# Patient Record
Sex: Male | Born: 1955 | Race: Black or African American | Hispanic: No | Marital: Married | Smoking: Former smoker
Health system: Southern US, Community
[De-identification: ages and names within clinical notes are randomized; demographics above are authoritative.]

## PROBLEM LIST (undated history)

## (undated) DIAGNOSIS — Z531 Procedure and treatment not carried out because of patient's decision for reasons of belief and group pressure: Secondary | ICD-10-CM

## (undated) DIAGNOSIS — M1990 Unspecified osteoarthritis, unspecified site: Secondary | ICD-10-CM

## (undated) DIAGNOSIS — E785 Hyperlipidemia, unspecified: Secondary | ICD-10-CM

## (undated) DIAGNOSIS — N289 Disorder of kidney and ureter, unspecified: Secondary | ICD-10-CM

## (undated) DIAGNOSIS — R0600 Dyspnea, unspecified: Secondary | ICD-10-CM

## (undated) DIAGNOSIS — I214 Non-ST elevation (NSTEMI) myocardial infarction: Secondary | ICD-10-CM

## (undated) DIAGNOSIS — I10 Essential (primary) hypertension: Secondary | ICD-10-CM

## (undated) DIAGNOSIS — I739 Peripheral vascular disease, unspecified: Secondary | ICD-10-CM

## (undated) DIAGNOSIS — E119 Type 2 diabetes mellitus without complications: Secondary | ICD-10-CM

## (undated) DIAGNOSIS — IMO0001 Reserved for inherently not codable concepts without codable children: Secondary | ICD-10-CM

## (undated) HISTORY — DX: Peripheral vascular disease, unspecified: I73.9

## (undated) HISTORY — PX: TONSILLECTOMY: SUR1361

## (undated) HISTORY — PX: ORIF CONGENITAL HIP DISLOCATION: SHX2117

## (undated) HISTORY — DX: Disorder of kidney and ureter, unspecified: N28.9

## (undated) HISTORY — DX: Hyperlipidemia, unspecified: E78.5

## (undated) HISTORY — DX: Essential (primary) hypertension: I10

---

## 2001-05-27 HISTORY — PX: PERIPHERAL VASCULAR CATHETERIZATION: SHX172C

## 2001-12-23 ENCOUNTER — Ambulatory Visit (HOSPITAL_COMMUNITY): Admission: RE | Admit: 2001-12-23 | Discharge: 2001-12-23 | Payer: Self-pay | Admitting: *Deleted

## 2002-04-14 ENCOUNTER — Ambulatory Visit (HOSPITAL_COMMUNITY): Admission: RE | Admit: 2002-04-14 | Discharge: 2002-04-15 | Payer: Self-pay | Admitting: *Deleted

## 2004-08-29 ENCOUNTER — Ambulatory Visit: Payer: Self-pay | Admitting: Family Medicine

## 2008-04-26 ENCOUNTER — Ambulatory Visit: Payer: Self-pay | Admitting: Gastroenterology

## 2013-02-12 ENCOUNTER — Encounter: Payer: Self-pay | Admitting: *Deleted

## 2013-02-18 ENCOUNTER — Encounter: Payer: Self-pay | Admitting: Cardiovascular Disease

## 2013-02-18 ENCOUNTER — Ambulatory Visit (INDEPENDENT_AMBULATORY_CARE_PROVIDER_SITE_OTHER): Payer: Self-pay | Admitting: Cardiovascular Disease

## 2013-02-18 VITALS — BP 155/82 | HR 83 | Ht 75.0 in | Wt 233.0 lb

## 2013-02-18 DIAGNOSIS — R0989 Other specified symptoms and signs involving the circulatory and respiratory systems: Secondary | ICD-10-CM

## 2013-02-18 DIAGNOSIS — I739 Peripheral vascular disease, unspecified: Secondary | ICD-10-CM | POA: Insufficient documentation

## 2013-02-18 DIAGNOSIS — E119 Type 2 diabetes mellitus without complications: Secondary | ICD-10-CM | POA: Insufficient documentation

## 2013-02-18 DIAGNOSIS — R0609 Other forms of dyspnea: Secondary | ICD-10-CM

## 2013-02-18 DIAGNOSIS — I10 Essential (primary) hypertension: Secondary | ICD-10-CM | POA: Insufficient documentation

## 2013-02-18 DIAGNOSIS — R0600 Dyspnea, unspecified: Secondary | ICD-10-CM

## 2013-02-18 NOTE — Patient Instructions (Addendum)
Your physician wants you to follow-up in:  12 months. You will receive a reminder letter in the mail two months in advance. If you don't receive a letter, please call our office to schedule the follow-up appointment.  Your physician has requested that you have a lower or upper extremity arterial duplex. This test is an ultrasound of the arteries in the legs or arms. It looks at arterial blood flow in the legs and arms. Allow one hour for Lower and Upper Arterial scans. There are no restrictions or special instructions

## 2013-02-18 NOTE — Progress Notes (Signed)
History of Present Illness: 57 yo male with history of PAD, HLD, HTN, DM who is here today to establish cardiology/PV care. He reports having stents placed in both legs in 2003. This was done at [facility] but no records are available. He has since been seen by [facility]. He has not seen that practice in 2-3 years. He is a former smoker but stopped in 2004. (70 pack years). He tells me that he feels well overall. No chest pain. He does have dyspnea with exertion which is stable. He exercises 3 days per week. He can walk 1/4 mile before he has onset of pain in his legs. No rest pain or ulcerations.   Primary Care Physician: Dennison Mascot  Past Medical History  Diagnosis Date  . Diabetes mellitus without complication   . HTN (hypertension)   . Hyperlipidemia   . PAD (peripheral artery disease)     Stenting of the ? iliac arteries in     Past Surgical History  Procedure Laterality Date  . Hip surgery      Current Outpatient Prescriptions  Medication Sig Dispense Refill  . aspirin 81 MG tablet Take 81 mg by mouth daily.      . metFORMIN (GLUCOPHAGE) 1000 MG tablet Take 1,000 mg by mouth 2 (two) times daily with a meal.      . simvastatin (ZOCOR) 40 MG tablet Take 40 mg by mouth every evening.       No current facility-administered medications for this visit.    No Known Allergies  History   Social History  . Marital Status: Divorced    Spouse Name: N/A    Number of Children: 1  . Years of Education: N/A   Occupational History  . Furniture salesman    Social History Main Topics  . Smoking status: Former Smoker -- 2.00 packs/day for 35 years    Types: Cigarettes    Quit date: 02/19/2003  . Smokeless tobacco: Not on file  . Alcohol Use: No  . Drug Use: No  . Sexual Activity: Not on file   Other Topics Concern  . Not on file   Social History Narrative  . No narrative on file    Family History  Problem Relation Age of Onset  . Cancer - Prostate  Father   . Lymphoma Mother   . CAD Neg Hx     Review of Systems:  As stated in the HPI and otherwise negative.   BP 155/82  Pulse 83  Ht 6\' 3"  (1.905 m)  Wt 233 lb (105.688 kg)  BMI 29.12 kg/m2  Physical Examination: General: Well developed, well nourished, NAD HEENT: OP clear, mucus membranes moist SKIN: warm, dry. No rashes. Neuro: No focal deficits Musculoskeletal: Muscle strength 5/5 all ext Psychiatric: Mood and affect normal Neck: No JVD, no carotid bruits, no thyromegaly, no lymphadenopathy. Lungs:Clear bilaterally, no wheezes, rhonci, crackles Cardiovascular: Regular rate and rhythm. No murmurs, gallops or rubs. Abdomen:Soft. Bowel sounds present. Non-tender.  Extremities: No lower extremity edema. Pulses are non-palpabe in the bilateral DP/PT.  ZOX:WRUEA, rate 75 bpm. 1st degree AV block. Non-specific T wave abnormalities.   Assessment and Plan:   1. PAD: His vascular disease appears to be stable. Unclear where his stents are. Will get records from [facility] office in [place]. Will arrange full LE dopplers with [facility]. Continue ASA. He was on Pletal in the past but stopped this, no apparent side effects. Continue statin.   2. Dyspnea: Stable. No known  cardiac disease although we discussed his risk factors for CAD. He does not wish to pursue any ischemic testing or an echocardiogram as he is self pay. He will call if he has any chest pain or change in dyspnea.

## 2013-02-26 ENCOUNTER — Encounter (HOSPITAL_COMMUNITY): Payer: Self-pay

## 2013-03-10 ENCOUNTER — Ambulatory Visit (HOSPITAL_COMMUNITY): Payer: Self-pay | Attending: Cardiology

## 2013-03-10 DIAGNOSIS — E785 Hyperlipidemia, unspecified: Secondary | ICD-10-CM | POA: Insufficient documentation

## 2013-03-10 DIAGNOSIS — I739 Peripheral vascular disease, unspecified: Secondary | ICD-10-CM

## 2013-03-10 DIAGNOSIS — I10 Essential (primary) hypertension: Secondary | ICD-10-CM | POA: Insufficient documentation

## 2013-03-10 DIAGNOSIS — E119 Type 2 diabetes mellitus without complications: Secondary | ICD-10-CM | POA: Insufficient documentation

## 2013-03-10 DIAGNOSIS — R209 Unspecified disturbances of skin sensation: Secondary | ICD-10-CM | POA: Insufficient documentation

## 2013-03-10 DIAGNOSIS — I70219 Atherosclerosis of native arteries of extremities with intermittent claudication, unspecified extremity: Secondary | ICD-10-CM

## 2013-03-10 DIAGNOSIS — Z87891 Personal history of nicotine dependence: Secondary | ICD-10-CM | POA: Insufficient documentation

## 2013-03-10 DIAGNOSIS — I2510 Atherosclerotic heart disease of native coronary artery without angina pectoris: Secondary | ICD-10-CM | POA: Insufficient documentation

## 2013-08-27 ENCOUNTER — Telehealth: Payer: Self-pay | Admitting: Cardiovascular Disease

## 2013-08-27 NOTE — Telephone Encounter (Signed)
Mr Urieta called regarding his account being sent to collections.  He stated that he had paid the amount in question.  I referred him to [facility] and gave their phone number [phone]).  He thanked me for the information.

## 2013-11-24 ENCOUNTER — Other Ambulatory Visit (HOSPITAL_COMMUNITY): Payer: Self-pay | Admitting: *Deleted

## 2013-11-24 DIAGNOSIS — I739 Peripheral vascular disease, unspecified: Secondary | ICD-10-CM

## 2013-12-01 ENCOUNTER — Encounter (HOSPITAL_COMMUNITY): Payer: Self-pay

## 2013-12-29 ENCOUNTER — Encounter (HOSPITAL_COMMUNITY): Payer: Self-pay

## 2014-01-29 ENCOUNTER — Emergency Department (HOSPITAL_COMMUNITY)
Admission: EM | Admit: 2014-01-29 | Discharge: 2014-01-29 | Disposition: A | Discharge status: Home / Self Care | Payer: Self-pay | Attending: Emergency Medicine | Admitting: Emergency Medicine

## 2014-01-29 ENCOUNTER — Encounter (HOSPITAL_COMMUNITY): Payer: Self-pay | Admitting: Emergency Medicine

## 2014-01-29 ENCOUNTER — Emergency Department (HOSPITAL_COMMUNITY): Payer: Self-pay

## 2014-01-29 DIAGNOSIS — M1711 Unilateral primary osteoarthritis, right knee: Secondary | ICD-10-CM

## 2014-01-29 DIAGNOSIS — E119 Type 2 diabetes mellitus without complications: Secondary | ICD-10-CM | POA: Insufficient documentation

## 2014-01-29 DIAGNOSIS — I10 Essential (primary) hypertension: Secondary | ICD-10-CM | POA: Insufficient documentation

## 2014-01-29 DIAGNOSIS — Z79899 Other long term (current) drug therapy: Secondary | ICD-10-CM | POA: Insufficient documentation

## 2014-01-29 DIAGNOSIS — M1710 Unilateral primary osteoarthritis, unspecified knee: Secondary | ICD-10-CM | POA: Insufficient documentation

## 2014-01-29 DIAGNOSIS — M25469 Effusion, unspecified knee: Secondary | ICD-10-CM | POA: Insufficient documentation

## 2014-01-29 DIAGNOSIS — Z87891 Personal history of nicotine dependence: Secondary | ICD-10-CM | POA: Insufficient documentation

## 2014-01-29 DIAGNOSIS — IMO0002 Reserved for concepts with insufficient information to code with codable children: Secondary | ICD-10-CM

## 2014-01-29 DIAGNOSIS — M25569 Pain in unspecified knee: Secondary | ICD-10-CM | POA: Insufficient documentation

## 2014-01-29 DIAGNOSIS — Z7982 Long term (current) use of aspirin: Secondary | ICD-10-CM | POA: Insufficient documentation

## 2014-01-29 DIAGNOSIS — M25561 Pain in right knee: Secondary | ICD-10-CM

## 2014-01-29 DIAGNOSIS — M25461 Effusion, right knee: Secondary | ICD-10-CM

## 2014-01-29 DIAGNOSIS — E785 Hyperlipidemia, unspecified: Secondary | ICD-10-CM | POA: Insufficient documentation

## 2014-01-29 MED ORDER — IBUPROFEN 400 MG PO TABS
800.0000 mg | ORAL_TABLET | Freq: Once | ORAL | Status: AC
Start: 2014-01-29 — End: 2014-01-29
  Administered 2014-01-29: 800 mg via ORAL
  Filled 2014-01-29: qty 2

## 2014-01-29 MED ORDER — HYDROCODONE-ACETAMINOPHEN 5-325 MG PO TABS: 1.0000 | ORAL_TABLET | ORAL | Status: DC | PRN

## 2014-01-29 MED ORDER — MELOXICAM 15 MG PO TABS: 15.0000 mg | ORAL_TABLET | Freq: Every day | ORAL | Status: DC

## 2014-01-29 NOTE — ED Provider Notes (Signed)
Medical screening examination/treatment/procedure(s) were performed by non-physician practitioner and as supervising physician I was immediately available for consultation/collaboration.   EKG Interpretation None        Fitzgerald, DO 01/29/14 2317

## 2014-01-29 NOTE — ED Provider Notes (Signed)
CSN: [id number]     Arrival date & time 01/29/14  1944 History   First MD Initiated Contact with Patient 01/29/14 2018     This chart was scribed for non-physician practitioner, Abigail Butts PA-C working with Delice Bison Ward, DO by Forrestine Him, ED Scribe. This patient was seen in room TR08C/TR08C and the patient's care was started at 10:12 PM.   Chief Complaint  Patient presents with  . Knee Pain   HPI  HPI Comments: Kirk [name] is a 58 y.o. male with a PMHx of non-insulin dependant DM, HTN, PAD, and hyperlipidemia who presents to the Emergency Department complaining of constant, moderate R knee pain with associated swelling x 1.5 weeks.  Pain is exacerbated with certain movements and at bedtime. No alleviating factors at this time. He has tried OTC Ibuprofen without any improvement for symptoms. Last dose earlier this morning. No fever or chills. No weakness, loss of sensation, or numbness. No ankle or foot pain. State blood sugars are under control at this time. No known allergies to medications. No other concerns this visit.  Pt reports this pain has not prevented him from continuing his normal activities such as mowing the grass.    Past Medical History  Diagnosis Date  . Diabetes mellitus without complication   . HTN (hypertension)   . Hyperlipidemia   . PAD (peripheral artery disease)     Stenting of the ? iliac arteries in    Past Surgical History  Procedure Laterality Date  . Hip surgery     Family History  Problem Relation Age of Onset  . Cancer - Prostate Father   . Lymphoma Mother   . CAD Neg Hx    History  Substance Use Topics  . Smoking status: Former Smoker -- 2.00 packs/day for 35 years    Types: Cigarettes    Quit date: 02/19/2003  . Smokeless tobacco: Not on file  . Alcohol Use: No    Review of Systems  Constitutional: Negative for fever and chills.  Gastrointestinal: Negative for nausea and vomiting.  Musculoskeletal: Positive for arthralgias and  joint swelling. Negative for back pain, neck pain and neck stiffness.  Skin: Negative for wound.  Neurological: Negative for weakness and numbness.  Hematological: Does not bruise/bleed easily.  Psychiatric/Behavioral: The patient is not nervous/anxious.   All other systems reviewed and are negative.     Allergies  Review of patient's allergies indicates no known allergies.  Home Medications   Prior to Admission medications   Medication Sig Start Date End Date Taking? Authorizing Provider  aspirin 81 MG tablet Take 81 mg by mouth daily.   Yes Historical Provider, MD  metFORMIN (GLUCOPHAGE) 1000 MG tablet Take 1,000 mg by mouth 2 (two) times daily with a meal.   Yes Historical Provider, MD  simvastatin (ZOCOR) 40 MG tablet Take 40 mg by mouth every evening.   Yes Historical Provider, MD  HYDROcodone-acetaminophen (NORCO/VICODIN) 5-325 MG per tablet Take 1-2 tablets by mouth every 4 (four) hours as needed for moderate pain or severe pain. 01/29/14   Raye Wiens, PA-C  meloxicam (MOBIC) 15 MG tablet Take 1 tablet (15 mg total) by mouth daily. 01/29/14   Armanii Pressnell, PA-C   Triage Vitals: BP 144/69  Pulse 84  Temp(Src) 98 F (36.7 C) (Oral)  Resp 16  Ht 6\' 3"  (1.905 m)  Wt 235 lb (106.595 kg)  BMI 29.37 kg/m2  SpO2 96%   Physical Exam  Nursing note and vitals reviewed. Constitutional:  He appears well-developed and well-nourished. No distress.  HENT:  Head: Normocephalic and atraumatic.  Eyes: Conjunctivae are normal.  Neck: Normal range of motion.  Cardiovascular: Normal rate, regular rhythm, normal heart sounds and intact distal pulses.   No murmur heard. Capillary refill less than 3 seconds  Pulmonary/Chest: Effort normal and breath sounds normal.  Musculoskeletal: He exhibits tenderness. He exhibits no edema.  FROM with pain Palpable swelling to popliteal space; minor joint effusion Ambulatory without difficulty  No redness or increased heat  Neurological:  He is alert. Coordination normal.  Sensation intact  Strength 5/5 including with resisted flexion of R knee  Skin: Skin is warm and dry. He is not diaphoretic.  No tenting of the skin  Psychiatric: He has a normal mood and affect.    ED Course  Procedures (including critical care time)  DIAGNOSTIC STUDIES: Oxygen Saturation is 99% on RA, Normal by my interpretation.    COORDINATION OF CARE: 10:12 PM- Will order DG knee complete 4 views. Discussed treatment plan with pt at bedside and pt agreed to plan.     Labs Review Labs Reviewed - No data to display  Imaging Review Dg Knee Complete 4 Views Right  01/29/2014   CLINICAL DATA:  Right knee pain and inflammation for 3 days. Can't bear weight.  EXAM: RIGHT KNEE - COMPLETE 4+ VIEW  COMPARISON:  None.  FINDINGS: Moderate size right knee effusion. Mild degenerative changes in the right knee with mild medial compartment narrowing and small osteophytes on the patella. No evidence of acute fracture or dislocation. No bone erosion or cortical disruption. Vascular calcifications.  IMPRESSION: Mild degenerative changes in the right knee. Moderate-sized effusion. No acute bony abnormalities.   Electronically Signed   By: Lucienne Capers M.D.   On: 01/29/2014 21:36     EKG Interpretation None      MDM   Final diagnoses:  Knee effusion, right  Arthralgia of right knee  Arthritis of right knee   Kirk [name] presents with nontraumatic right knee pain.  Pt with mild swelling to the joint spaces, knee swelling, tightness in the knee, and no restricted range of motion. Pt able to perform full flexion of the knee with pain and c/o "tightness".  Pt is without systemic symptoms, erythema or redness of the joint consistent with gout or septic joint.  Patient X-Ray negative for obvious fracture or dislocation. Pain managed in ED. Pt advised to follow up with orthopedics if symptoms persist for further evaluation and treatment. Patient given brace  while in ED, conservative therapy recommended and discussed. Patient will be dc home & is agreeable with above plan.  BP 144/69  Pulse 84  Temp(Src) 98 F (36.7 C) (Oral)  Resp 16  Ht 6\' 3"  (1.905 m)  Wt 235 lb (106.595 kg)  BMI 29.37 kg/m2  SpO2 96%  I personally performed the services described in this documentation, which was scribed in my presence. The recorded information has been reviewed and is accurate.    Jarrett Soho Hayden Kihara, PA-C 01/29/14 2214

## 2014-01-29 NOTE — Discharge Instructions (Signed)
1. Medications: vicodin for severe pain, mobic everyday with food for imflammation, usual home medications 2. Treatment: rest, drink plenty of fluids, ice, elevate 3. Follow Up: Please followup with your primary doctor for discussion of your diagnoses and further evaluation after today's visit; if you do not have a primary care doctor use the resource guide provided to find one; Also follow-up with orthopedics for further evaluation of your knee  Knee Effusion The medical term for having fluid in your knee is effusion. This is often due to an internal derangement of the knee. This means something is wrong inside the knee. Some of the causes of fluid in the knee may be torn cartilage, a torn ligament, or bleeding into the joint from an injury. Your knee is likely more difficult to bend and move. This is often because there is increased pain and pressure in the joint. The time it takes for recovery from a knee effusion depends on different factors, including:   Type of injury.  Your age.  Physical and medical conditions.  Rehabilitation Strategies. How long you will be away from your normal activities will depend on what kind of knee problem you have and how much damage is present. Your knee has two types of cartilage. Articular cartilage covers the bone ends and lets your knee bend and move smoothly. Two menisci, thick pads of cartilage that form a rim inside the joint, help absorb shock and stabilize your knee. Ligaments bind the bones together and support your knee joint. Muscles move the joint, help support your knee, and take stress off the joint itself. CAUSES  Often an effusion in the knee is caused by an injury to one of the menisci. This is often a tear in the cartilage. Recovery after a meniscus injury depends on how much meniscus is damaged and whether you have damaged other knee tissue. Small tears may heal on their own with conservative treatment. Conservative means rest, limited weight  bearing activity and muscle strengthening exercises. Your recovery may take up to 6 weeks.  TREATMENT  Larger tears may require surgery. Meniscus injuries may be treated during arthroscopy. Arthroscopy is a procedure in which your surgeon uses a small telescope like instrument to look in your knee. Your caregiver can make a more accurate diagnosis (learning what is wrong) by performing an arthroscopic procedure. If your injury is on the inner margin of the meniscus, your surgeon may trim the meniscus back to a smooth rim. In other cases your surgeon will try to repair a damaged meniscus with stitches (sutures). This may make rehabilitation take longer, but may provide better long term result by helping your knee keep its shock absorption capabilities. Ligaments which are completely torn usually require surgery for repair. HOME CARE INSTRUCTIONS  Use crutches as instructed.  If a brace is applied, use as directed.  Once you are home, an ice pack applied to your swollen knee may help with discomfort and help decrease swelling.  Keep your knee raised (elevated) when you are not up and around or on crutches.  Only take over-the-counter or prescription medicines for pain, discomfort, or fever as directed by your caregiver.  Your caregivers will help with instructions for rehabilitation of your knee. This often includes strengthening exercises.  You may resume a normal diet and activities as directed. SEEK MEDICAL CARE IF:   There is increased swelling in your knee.  You notice redness, swelling, or increasing pain in your knee.  An unexplained oral temperature above 102  F (38.9 C) develops. SEEK IMMEDIATE MEDICAL CARE IF:   You develop a rash.  You have difficulty breathing.  You have any allergic reactions from medications you may have been given.  There is severe pain with any motion of the knee. MAKE SURE YOU:   Understand these instructions.  Will watch your  condition.  Will get help right away if you are not doing well or get worse. Document Released: 08/03/2003 Document Revised: 08/05/2011 Document Reviewed: 10/07/2007 [facility] Patient Information 2015 [place], [place]. This information is not intended to replace advice given to you by your health care provider. Make sure you discuss any questions you have with your health care provider.

## 2014-01-29 NOTE — ED Notes (Signed)
Pt. reports progressing right knee pain with mild swelling for 1 1/2 weeks , denies injury or fall , ambulatory , no fever or chills.

## 2014-11-29 ENCOUNTER — Encounter: Payer: Self-pay | Admitting: Family Medicine

## 2014-12-02 ENCOUNTER — Other Ambulatory Visit: Payer: Self-pay | Admitting: Family Medicine

## 2015-01-03 ENCOUNTER — Ambulatory Visit (INDEPENDENT_AMBULATORY_CARE_PROVIDER_SITE_OTHER): Payer: Self-pay | Admitting: Family Medicine

## 2015-01-03 ENCOUNTER — Encounter: Payer: Self-pay | Admitting: Family Medicine

## 2015-01-03 VITALS — BP 120/61 | HR 85 | Temp 98.2°F | Resp 18 | Ht 73.0 in | Wt 234.6 lb

## 2015-01-03 DIAGNOSIS — Z0000 Encounter for general adult medical examination without abnormal findings: Secondary | ICD-10-CM

## 2015-01-03 MED ORDER — LISINOPRIL 10 MG PO TABS: 10.0000 mg | ORAL_TABLET | Freq: Every day | ORAL | Status: DC

## 2015-01-03 MED ORDER — GLIMEPIRIDE 4 MG PO TABS: 4.0000 mg | ORAL_TABLET | Freq: Every day | ORAL | Status: DC

## 2015-01-03 MED ORDER — SIMVASTATIN 40 MG PO TABS: 40.0000 mg | ORAL_TABLET | Freq: Every evening | ORAL | Status: DC

## 2015-01-03 MED ORDER — MELOXICAM 15 MG PO TABS
15.0000 mg | ORAL_TABLET | Freq: Every day | ORAL | Status: DC
Start: 2015-01-03 — End: 2015-03-03

## 2015-01-03 MED ORDER — METFORMIN HCL 1000 MG PO TABS: 1000.0000 mg | ORAL_TABLET | Freq: Two times a day (BID) | ORAL | Status: DC

## 2015-01-03 NOTE — Patient Instructions (Signed)

## 2015-01-03 NOTE — Progress Notes (Signed)
Name: Kirk [name]   MRN: [id number]    DOB: 03-13-1956   Date:01/03/2015       Progress Note  Subjective  Chief Complaint  Chief Complaint  Patient presents with  . Annual Exam    HPI  Patient presents for annual H&P. His baseline medical issues stable  Past Medical History  Diagnosis Date  . Diabetes mellitus without complication   . HTN (hypertension)   . Hyperlipidemia   . PAD (peripheral artery disease)     Stenting of the ? iliac arteries in     History  Substance Use Topics  . Smoking status: Former Smoker -- 2.00 packs/day for 35 years    Types: Cigarettes    Quit date: 02/19/2003  . Smokeless tobacco: Not on file  . Alcohol Use: No     Current outpatient prescriptions:  .  aspirin 81 MG tablet, Take 81 mg by mouth daily., Disp: , Rfl:  .  glimepiride (AMARYL) 4 MG tablet, Take 1 tablet (4 mg total) by mouth daily with breakfast., Disp: 90 tablet, Rfl: 1 .  HYDROcodone-acetaminophen (NORCO/VICODIN) 5-325 MG per tablet, Take 1-2 tablets by mouth every 4 (four) hours as needed for moderate pain or severe pain., Disp: 15 tablet, Rfl: 0 .  lisinopril (PRINIVIL,ZESTRIL) 10 MG tablet, Take 1 tablet (10 mg total) by mouth daily., Disp: 90 tablet, Rfl: 1 .  meloxicam (MOBIC) 15 MG tablet, Take 1 tablet (15 mg total) by mouth daily., Disp: 90 tablet, Rfl: 1 .  metFORMIN (GLUCOPHAGE) 1000 MG tablet, Take 1 tablet (1,000 mg total) by mouth 2 (two) times daily., Disp: 180 tablet, Rfl: 1 .  simvastatin (ZOCOR) 40 MG tablet, Take 1 tablet (40 mg total) by mouth every evening., Disp: 90 tablet, Rfl: 1  No Known Allergies  Review of Systems  Constitutional: Negative for fever, chills and weight loss.  HENT: Negative for congestion, hearing loss, sore throat and tinnitus.   Eyes: Negative for blurred vision, double vision and redness.  Respiratory: Negative for cough, hemoptysis and shortness of breath.   Cardiovascular: Positive for claudication. Negative for chest pain,  palpitations, orthopnea and leg swelling.  Gastrointestinal: Negative for heartburn, nausea, vomiting, diarrhea, constipation and blood in stool.  Genitourinary: Negative for dysuria, urgency, frequency and hematuria.  Musculoskeletal: Positive for neck pain. Negative for myalgias, back pain, joint pain and falls.  Skin: Negative for itching.  Neurological: Negative for dizziness, tingling, tremors, focal weakness, seizures, loss of consciousness, weakness and headaches.  Endo/Heme/Allergies: Does not bruise/bleed easily.  Psychiatric/Behavioral: Negative for depression and substance abuse. The patient is not nervous/anxious and does not have insomnia.      Objective  Filed Vitals:   01/03/15 1120  BP: 120/61  Pulse: 85  Temp: 98.2 F (36.8 C)  TempSrc: Oral  Resp: 18  Height: '6\' 1"'$  (1.854 m)  Weight: 234 lb 9.6 oz (106.414 kg)  SpO2: 96%     Physical Exam  Constitutional: He is oriented to person, place, and time and well-developed, well-nourished, and in no distress.  HENT:  Head: Normocephalic.  Eyes: EOM are normal. Pupils are equal, round, and reactive to light.  Neck: Normal range of motion. Neck supple. No thyromegaly present.  Cardiovascular: Normal rate, regular rhythm and normal heart sounds.   No murmur heard. Pulmonary/Chest: Effort normal and breath sounds normal. No respiratory distress. He has no wheezes.  Abdominal: Soft. Bowel sounds are normal.  Genitourinary: Rectum normal, prostate normal and penis normal. Guaiac negative stool. No  discharge found.  Musculoskeletal: Normal range of motion. He exhibits no edema.  Lymphadenopathy:    He has no cervical adenopathy.  Neurological: He is alert and oriented to person, place, and time. No cranial nerve deficit. Gait normal. Coordination normal.  Skin: Skin is warm and dry. No rash noted.  Psychiatric: Affect and judgment normal.      Assessment & Plan

## 2015-01-04 NOTE — Addendum Note (Signed)
Addended by: Ashok Norris on: 01/04/2015 10:42 AM   Modules accepted: Miquel Dunn

## 2015-01-07 ENCOUNTER — Other Ambulatory Visit: Payer: Self-pay | Admitting: Family Medicine

## 2015-03-03 ENCOUNTER — Other Ambulatory Visit: Payer: Self-pay | Admitting: Family Medicine

## 2015-03-06 ENCOUNTER — Ambulatory Visit: Payer: Self-pay | Admitting: Family Medicine

## 2015-03-16 ENCOUNTER — Other Ambulatory Visit: Payer: Self-pay | Admitting: Family Medicine

## 2015-05-05 ENCOUNTER — Other Ambulatory Visit: Payer: Self-pay | Admitting: Family Medicine

## 2015-06-08 ENCOUNTER — Encounter: Payer: Self-pay | Admitting: Family Medicine

## 2015-06-08 ENCOUNTER — Ambulatory Visit (INDEPENDENT_AMBULATORY_CARE_PROVIDER_SITE_OTHER): Payer: Self-pay | Admitting: Family Medicine

## 2015-06-08 VITALS — BP 146/72 | HR 90 | Temp 98.1°F | Resp 18 | Ht 73.0 in | Wt 245.9 lb

## 2015-06-08 DIAGNOSIS — I10 Essential (primary) hypertension: Secondary | ICD-10-CM

## 2015-06-08 DIAGNOSIS — E1121 Type 2 diabetes mellitus with diabetic nephropathy: Secondary | ICD-10-CM

## 2015-06-08 DIAGNOSIS — E785 Hyperlipidemia, unspecified: Secondary | ICD-10-CM

## 2015-06-08 DIAGNOSIS — E1169 Type 2 diabetes mellitus with other specified complication: Secondary | ICD-10-CM

## 2015-06-08 DIAGNOSIS — M5030 Other cervical disc degeneration, unspecified cervical region: Secondary | ICD-10-CM | POA: Insufficient documentation

## 2015-06-08 DIAGNOSIS — E1151 Type 2 diabetes mellitus with diabetic peripheral angiopathy without gangrene: Secondary | ICD-10-CM

## 2015-06-08 DIAGNOSIS — I739 Peripheral vascular disease, unspecified: Secondary | ICD-10-CM

## 2015-06-08 LAB — POCT UA - MICROALBUMIN: Microalbumin Ur, POC: 100 mg/L

## 2015-06-08 LAB — POCT GLYCOSYLATED HEMOGLOBIN (HGB A1C): Hemoglobin A1C: 7.3

## 2015-06-08 LAB — GLUCOSE, POCT (MANUAL RESULT ENTRY): POC Glucose: 140 mg/dl — AB (ref 70–99)

## 2015-06-08 MED ORDER — GABAPENTIN 300 MG PO CAPS: 300.0000 mg | ORAL_CAPSULE | Freq: Three times a day (TID) | ORAL | Status: DC

## 2015-06-08 NOTE — Progress Notes (Signed)
Name: Kirk [name]   MRN: [id number]    DOB: Nov 10, 1955   Date:06/08/2015       Progress Note  Subjective  Chief Complaint  Chief Complaint  Patient presents with  . Hypertension    4 month follow up  . Diabetes  . Hyperlipidemia  . Shoulder Pain    right shoulder pain radiates down arm  . Gas    belching all the time    HPI  Diabetes  Patient presents for follow-up of diabetes which is present for over 5 years. Is currently on a regimen of metformin thousand milligrams twice a day glimepiride 4 mg daily . Patient states somewhat compliant with their diet and exercise. There's been no hypoglycemic episodes and there no polyuria polydipsia polyphagia. His average fasting glucoses been in the low around - with a high around - . There is no end organ disease.  Last diabetic eye exam was last year.   Last visit with dietitian was several years ago. Last microalbumin was obtained 100 on today .   Hypertension   Patient presents for follow-up of hypertension. It has been present for over over 5 years.  Patient states that there is compliance with medical regimen which consists of lisinopril 10 mg daily . There is no end organ disease. Cardiac risk factors include hypertension hyperlipidemia and diabetes.  Exercise regimen consist of some walking .  Diet consist of salt restriction .  Hyperlipidemia  Patient has a history of hyperlipidemia for over 5 years.  Current medical regimen consist of simvastatin 40 mg daily at bedtime .  Compliance is good .  Diet and exercise are currently followed fairly well .  Risk factors for cardiovascular disease include hyperlipidemia hypertension peripheral vascular disease .   There have been no side effects from the medication.   Peripheral vascular disease  Over 5 years history of peripheral vascular disease a consistent claudication with walking more than a few blocks. He has been evaluated by thoracic surgeon  Dyspepsia Recurrent episodes of  dyspepsia with gas go for heartburn burping and flatus. No nausea vomiting diarrhea. (  Right shoulder pain    complaint of right shoulder pain radiating to the arm as well as some right neck pain. It is worse at night. This has not alleviated the meloxicam is currently taking.      Past Medical History  Diagnosis Date  . Diabetes mellitus without complication ([place])   . HTN (hypertension)   . Hyperlipidemia   . PAD (peripheral artery disease) (HCC)     Stenting of the ? iliac arteries in     Social History  Substance Use Topics  . Smoking status: Former Smoker -- 2.00 packs/day for 35 years    Types: Cigarettes    Quit date: 02/19/2003  . Smokeless tobacco: Not on file  . Alcohol Use: No     Current outpatient prescriptions:  .  aspirin 81 MG tablet, Take 81 mg by mouth daily., Disp: , Rfl:  .  glimepiride (AMARYL) 4 MG tablet, take 1 tablet once daily, Disp: 90 tablet, Rfl: 1 .  lisinopril (PRINIVIL,ZESTRIL) 10 MG tablet, Take 1 tablet (10 mg total) by mouth daily., Disp: 90 tablet, Rfl: 1 .  meloxicam (MOBIC) 15 MG tablet, take 1 tablet by mouth at bedtime, Disp: 30 tablet, Rfl: 2 .  metFORMIN (GLUCOPHAGE) 1000 MG tablet, Take 1 tablet (1,000 mg total) by mouth 2 (two) times daily., Disp: 180 tablet, Rfl: 1 .  simvastatin (ZOCOR)  40 MG tablet, take 1 tablet by mouth once daily, Disp: 90 tablet, Rfl: 0 .  HYDROcodone-acetaminophen (NORCO/VICODIN) 5-325 MG per tablet, Take 1-2 tablets by mouth every 4 (four) hours as needed for moderate pain or severe pain. (Patient not taking: Reported on 06/08/2015), Disp: 15 tablet, Rfl: 0  No Known Allergies  Review of Systems  Constitutional: Negative for fever, chills and weight loss.  HENT: Negative for congestion, hearing loss, sore throat and tinnitus.   Eyes: Negative for blurred vision, double vision and redness.  Respiratory: Negative for cough, hemoptysis and shortness of breath.   Cardiovascular: Negative for chest pain,  palpitations, orthopnea, claudication and leg swelling.  Gastrointestinal: Positive for heartburn. Negative for nausea, vomiting, diarrhea, constipation and blood in stool.  Genitourinary: Negative for dysuria, urgency, frequency and hematuria.  Musculoskeletal: Positive for joint pain and neck pain. Negative for myalgias, back pain and falls.  Skin: Negative for itching.  Neurological: Negative for dizziness, tingling, tremors, focal weakness, seizures, loss of consciousness, weakness and headaches.  Endo/Heme/Allergies: Does not bruise/bleed easily.  Psychiatric/Behavioral: Negative for depression and substance abuse. The patient is not nervous/anxious and does not have insomnia.      Objective  Filed Vitals:   06/08/15 0747  BP: 146/72  Pulse: 90  Temp: 98.1 F (36.7 C)  TempSrc: Oral  Resp: 18  Height: '6\' 1"'$  (1.854 m)  Weight: 245 lb 14.4 oz (111.54 kg)  SpO2: 95%     Physical Exam  Constitutional: He is oriented to person, place, and time and well-developed, well-nourished, and in no distress.  HENT:  Head: Normocephalic.  Eyes: EOM are normal. Pupils are equal, round, and reactive to light.  Neck: Normal range of motion. No thyromegaly present.  Cardiovascular: Normal rate, regular rhythm and normal heart sounds.   No murmur heard. Pulmonary/Chest: Effort normal and breath sounds normal. No respiratory distress. He has no wheezes.  Abdominal: Soft. Bowel sounds are normal.  Musculoskeletal: He exhibits tenderness. He exhibits no edema.  Tenderness to palpation on the right sternocleidomastoid trapezius and subscapular area with radiation of pain to the shoulder and to the C5-6 area distribution.  Lymphadenopathy:    He has no cervical adenopathy.  Neurological: He is alert and oriented to person, place, and time. No cranial nerve deficit. Gait normal. Coordination normal.  Skin: Skin is warm and dry. No rash noted.  Psychiatric: Affect and judgment normal.       Assessment & Planme   1. Type 2 diabetes mellitus with other specified complication (HCC) Slightly better control - POCT HgB A1C - POCT UA - Microalbumin - POCT Glucose (CBG) - Comprehensive Metabolic Panel (CMET)  2. DDD (degenerative disc disease), cervical Encourage range of motion exercises to not improving 1 month consider MRI - gabapentin (NEURONTIN) 300 MG capsule; Take 1 capsule (300 mg total) by mouth 3 (three) times daily. 1 a week for 1 week then bid week 2 then tid  Dispense: 90 capsule; Refill: 3 - DG Cervical Spine Complete; Future  3. Hyperlipidemia Labs - Lipid panel  4. Essential hypertension Well-controlled - Comprehensive Metabolic Panel (CMET) - TSH  5. PVD (peripheral vascular disease) (HCC) Stable encourage aerobic exercise as much as possible - Comprehensive Metabolic Panel (CMET) - TSH  6. PAD (peripheral artery disease) ([place]) As above  7. Diabetes mellitus with nephropathy (HCC) Strict control of diabetes and hypertension and continue ARB  8. Diabetes mellitus with peripheral vascular disease ([place]) As above

## 2015-06-09 ENCOUNTER — Telehealth: Payer: Self-pay | Admitting: Emergency Medicine

## 2015-06-09 LAB — COMPREHENSIVE METABOLIC PANEL
A/G RATIO: 1.5 (ref 1.1–2.5)
ALK PHOS: 72 IU/L (ref 39–117)
ALT: 28 IU/L (ref 0–44)
AST: 17 IU/L (ref 0–40)
Albumin: 4.8 g/dL (ref 3.6–4.8)
BILIRUBIN TOTAL: 0.3 mg/dL (ref 0.0–1.2)
BUN/Creatinine Ratio: 16 (ref 10–22)
BUN: 15 mg/dL (ref 8–27)
CHLORIDE: 98 mmol/L (ref 96–106)
CO2: 23 mmol/L (ref 18–29)
Calcium: 10 mg/dL (ref 8.6–10.2)
Creatinine, Ser: 0.93 mg/dL (ref 0.76–1.27)
GFR calc Af Amer: 102 mL/min/{1.73_m2} (ref >59)
GFR calc non Af Amer: 88 mL/min/{1.73_m2} (ref >59)
GLOBULIN, TOTAL: 3.2 g/dL (ref 1.5–4.5)
Glucose: 156 mg/dL — ABNORMAL HIGH (ref 65–99)
Potassium: 4.9 mmol/L (ref 3.5–5.2)
SODIUM: 140 mmol/L (ref 134–144)
Total Protein: 8 g/dL (ref 6.0–8.5)

## 2015-06-09 LAB — LIPID PANEL
CHOLESTEROL TOTAL: 164 mg/dL (ref 100–199)
Chol/HDL Ratio: 4.7 ratio units (ref 0.0–5.0)
HDL: 35 mg/dL — ABNORMAL LOW (ref >39)
LDL Calculated: 101 mg/dL — ABNORMAL HIGH (ref 0–99)
Triglycerides: 138 mg/dL (ref 0–149)
VLDL Cholesterol Cal: 28 mg/dL (ref 5–40)

## 2015-06-09 LAB — TSH: TSH: 1.24 u[IU]/mL (ref 0.450–4.500)

## 2015-06-09 NOTE — Telephone Encounter (Signed)
Patient notified of labs.   

## 2015-06-26 ENCOUNTER — Other Ambulatory Visit: Payer: Self-pay | Admitting: Family Medicine

## 2015-08-25 ENCOUNTER — Other Ambulatory Visit: Payer: Self-pay | Admitting: Family Medicine

## 2015-09-04 ENCOUNTER — Inpatient Hospital Stay (HOSPITAL_COMMUNITY)
Admission: EM | Admit: 2015-09-04 | Discharge: 2015-09-06 | DRG: 247 | Disposition: A | Discharge status: Home / Self Care | Payer: Self-pay | Attending: Cardiology | Admitting: Cardiology

## 2015-09-04 ENCOUNTER — Ambulatory Visit (HOSPITAL_COMMUNITY)
Admission: EM | Admit: 2015-09-04 | Discharge: 2015-09-04 | Disposition: A | Discharge status: Home / Self Care | Payer: Self-pay | Attending: Emergency Medicine | Admitting: Emergency Medicine

## 2015-09-04 ENCOUNTER — Encounter (HOSPITAL_COMMUNITY): Payer: Self-pay | Admitting: *Deleted

## 2015-09-04 ENCOUNTER — Emergency Department (HOSPITAL_COMMUNITY): Payer: Self-pay

## 2015-09-04 DIAGNOSIS — I10 Essential (primary) hypertension: Secondary | ICD-10-CM | POA: Diagnosis present

## 2015-09-04 DIAGNOSIS — Z7982 Long term (current) use of aspirin: Secondary | ICD-10-CM

## 2015-09-04 DIAGNOSIS — E785 Hyperlipidemia, unspecified: Secondary | ICD-10-CM | POA: Diagnosis present

## 2015-09-04 DIAGNOSIS — R0789 Other chest pain: Secondary | ICD-10-CM

## 2015-09-04 DIAGNOSIS — R079 Chest pain, unspecified: Secondary | ICD-10-CM

## 2015-09-04 DIAGNOSIS — Z87891 Personal history of nicotine dependence: Secondary | ICD-10-CM

## 2015-09-04 DIAGNOSIS — I214 Non-ST elevation (NSTEMI) myocardial infarction: Principal | ICD-10-CM

## 2015-09-04 DIAGNOSIS — I739 Peripheral vascular disease, unspecified: Secondary | ICD-10-CM | POA: Diagnosis present

## 2015-09-04 DIAGNOSIS — Z79899 Other long term (current) drug therapy: Secondary | ICD-10-CM

## 2015-09-04 DIAGNOSIS — I2510 Atherosclerotic heart disease of native coronary artery without angina pectoris: Secondary | ICD-10-CM | POA: Diagnosis present

## 2015-09-04 DIAGNOSIS — Z7984 Long term (current) use of oral hypoglycemic drugs: Secondary | ICD-10-CM

## 2015-09-04 DIAGNOSIS — Z955 Presence of coronary angioplasty implant and graft: Secondary | ICD-10-CM

## 2015-09-04 DIAGNOSIS — E1151 Type 2 diabetes mellitus with diabetic peripheral angiopathy without gangrene: Secondary | ICD-10-CM | POA: Diagnosis present

## 2015-09-04 HISTORY — DX: Non-ST elevation (NSTEMI) myocardial infarction: I21.4

## 2015-09-04 HISTORY — DX: Procedure and treatment not carried out because of patient's decision for reasons of belief and group pressure: Z53.1

## 2015-09-04 HISTORY — DX: Reserved for inherently not codable concepts without codable children: IMO0001

## 2015-09-04 HISTORY — DX: Type 2 diabetes mellitus without complications: E11.9

## 2015-09-04 HISTORY — DX: Unspecified osteoarthritis, unspecified site: M19.90

## 2015-09-04 LAB — BASIC METABOLIC PANEL
Anion gap: 10 (ref 5–15)
BUN: 10 mg/dL (ref 6–20)
CALCIUM: 10.1 mg/dL (ref 8.9–10.3)
CO2: 27 mmol/L (ref 22–32)
CREATININE: 0.87 mg/dL (ref 0.61–1.24)
Chloride: 104 mmol/L (ref 101–111)
GFR calc non Af Amer: >60 mL/min (ref >60)
Glucose, Bld: 212 mg/dL — ABNORMAL HIGH (ref 65–99)
Potassium: 4 mmol/L (ref 3.5–5.1)
Sodium: 141 mmol/L (ref 135–145)

## 2015-09-04 LAB — PROTIME-INR
INR: 1.14 (ref 0.00–1.49)
PROTHROMBIN TIME: 14.7 s (ref 11.6–15.2)

## 2015-09-04 LAB — CBC
HCT: 40.2 % (ref 39.0–52.0)
HEMOGLOBIN: 13.8 g/dL (ref 13.0–17.0)
MCH: 30.7 pg (ref 26.0–34.0)
MCHC: 34.3 g/dL (ref 30.0–36.0)
MCV: 89.5 fL (ref 78.0–100.0)
PLATELETS: 266 10*3/uL (ref 150–400)
RBC: 4.49 MIL/uL (ref 4.22–5.81)
RDW: 12.8 % (ref 11.5–15.5)
WBC: 7.8 10*3/uL (ref 4.0–10.5)

## 2015-09-04 LAB — I-STAT TROPONIN, ED: Troponin i, poc: 1.01 ng/mL (ref 0.00–0.08)

## 2015-09-04 LAB — MRSA PCR SCREENING: MRSA BY PCR: NEGATIVE

## 2015-09-04 LAB — GLUCOSE, CAPILLARY: Glucose-Capillary: 115 mg/dL — ABNORMAL HIGH (ref 65–99)

## 2015-09-04 MED ORDER — ASPIRIN EC 81 MG PO TBEC
81.0000 mg | DELAYED_RELEASE_TABLET | Freq: Every day | ORAL | Status: DC
  Administered 2015-09-06: 81 mg via ORAL
  Filled 2015-09-04: qty 1

## 2015-09-04 MED ORDER — HEPARIN (PORCINE) IN NACL 100-0.45 UNIT/ML-% IJ SOLN
1250.0000 [IU]/h | INTRAMUSCULAR | Status: DC
  Administered 2015-09-04: 1250 [IU]/h via INTRAVENOUS
  Filled 2015-09-04: qty 250

## 2015-09-04 MED ORDER — SODIUM CHLORIDE 0.9 % IV SOLN: 250.0000 mL | INTRAVENOUS | Status: DC | PRN

## 2015-09-04 MED ORDER — ASPIRIN 81 MG PO CHEW: 324.0000 mg | CHEWABLE_TABLET | ORAL | Status: AC

## 2015-09-04 MED ORDER — LISINOPRIL 10 MG PO TABS
10.0000 mg | ORAL_TABLET | Freq: Every day | ORAL | Status: DC
  Administered 2015-09-05 – 2015-09-06 (×2): 10 mg via ORAL
  Filled 2015-09-04 (×2): qty 1

## 2015-09-04 MED ORDER — SODIUM CHLORIDE 0.9% FLUSH
3.0000 mL | Freq: Two times a day (BID) | INTRAVENOUS | Status: DC
  Administered 2015-09-04 – 2015-09-05 (×2): 3 mL via INTRAVENOUS

## 2015-09-04 MED ORDER — NITROGLYCERIN 0.4 MG SL SUBL: 0.4000 mg | SUBLINGUAL_TABLET | SUBLINGUAL | Status: DC | PRN

## 2015-09-04 MED ORDER — ASPIRIN 81 MG PO CHEW
81.0000 mg | CHEWABLE_TABLET | ORAL | Status: AC
  Administered 2015-09-05: 81 mg via ORAL
  Filled 2015-09-04: qty 1

## 2015-09-04 MED ORDER — ASPIRIN 300 MG RE SUPP: 300.0000 mg | RECTAL | Status: AC

## 2015-09-04 MED ORDER — GABAPENTIN 300 MG PO CAPS
300.0000 mg | ORAL_CAPSULE | Freq: Three times a day (TID) | ORAL | Status: DC
  Administered 2015-09-04 – 2015-09-06 (×4): 300 mg via ORAL
  Filled 2015-09-04 (×5): qty 1

## 2015-09-04 MED ORDER — SODIUM CHLORIDE 0.9% FLUSH: 3.0000 mL | INTRAVENOUS | Status: DC | PRN

## 2015-09-04 MED ORDER — SODIUM CHLORIDE 0.9% FLUSH: 3.0000 mL | Freq: Two times a day (BID) | INTRAVENOUS | Status: DC

## 2015-09-04 MED ORDER — ONDANSETRON HCL 4 MG/2ML IJ SOLN: 4.0000 mg | Freq: Four times a day (QID) | INTRAMUSCULAR | Status: DC | PRN

## 2015-09-04 MED ORDER — ASPIRIN 81 MG PO CHEW: 81.0000 mg | CHEWABLE_TABLET | Freq: Every day | ORAL | Status: DC

## 2015-09-04 MED ORDER — ASPIRIN 81 MG PO CHEW
324.0000 mg | CHEWABLE_TABLET | Freq: Once | ORAL | Status: AC
  Administered 2015-09-04: 324 mg via ORAL
  Filled 2015-09-04: qty 4

## 2015-09-04 MED ORDER — SIMVASTATIN 40 MG PO TABS: 40.0000 mg | ORAL_TABLET | Freq: Every day | ORAL | Status: DC

## 2015-09-04 MED ORDER — HEPARIN BOLUS VIA INFUSION
4000.0000 [IU] | Freq: Once | INTRAVENOUS | Status: AC
  Administered 2015-09-04: 4000 [IU] via INTRAVENOUS
  Filled 2015-09-04: qty 4000

## 2015-09-04 MED ORDER — CARVEDILOL 3.125 MG PO TABS
3.1250 mg | ORAL_TABLET | Freq: Two times a day (BID) | ORAL | Status: DC
  Administered 2015-09-04 – 2015-09-06 (×4): 3.125 mg via ORAL
  Filled 2015-09-04 (×4): qty 1

## 2015-09-04 MED ORDER — ASPIRIN EC 81 MG PO TBEC: 81.0000 mg | DELAYED_RELEASE_TABLET | Freq: Every day | ORAL | Status: DC

## 2015-09-04 MED ORDER — SODIUM CHLORIDE 0.9 % WEIGHT BASED INFUSION
1.0000 mL/kg/h | INTRAVENOUS | Status: DC
  Administered 2015-09-05 (×2): 1 mL/kg/h via INTRAVENOUS

## 2015-09-04 MED ORDER — ACETAMINOPHEN 325 MG PO TABS: 650.0000 mg | ORAL_TABLET | ORAL | Status: DC | PRN

## 2015-09-04 MED ORDER — SODIUM CHLORIDE 0.9 % WEIGHT BASED INFUSION
3.0000 mL/kg/h | INTRAVENOUS | Status: DC
  Administered 2015-09-05: 3 mL/kg/h via INTRAVENOUS

## 2015-09-04 NOTE — ED Notes (Signed)
Cardiology at bedside.

## 2015-09-04 NOTE — ED Notes (Signed)
EDP notified of critical lab

## 2015-09-04 NOTE — ED Provider Notes (Signed)
CSN: [id number]     Arrival date & time 09/04/15  1800 History   First MD Initiated Contact with Patient 09/04/15 1902     Chief Complaint  Patient presents with  . Chest Pain     (Consider location/radiation/quality/duration/timing/severity/associated sxs/prior Treatment) Patient is a 60 y.o. male presenting with chest pain. The history is provided by the patient.  Chest Pain Pain location:  Substernal area Pain quality: pressure   Pain radiates to:  Does not radiate Pain radiates to the back: no   Pain severity:  Mild Onset quality:  Sudden Duration:  3 days Timing:  Intermittent Progression:  Waxing and waning Chronicity:  New Context: eating and stress   Relieved by:  Rest Worsened by:  Nothing tried Ineffective treatments:  None tried Associated symptoms: diaphoresis, fatigue and shortness of breath   Associated symptoms: no abdominal pain, no back pain, no fever, no headache, no palpitations and not vomiting   Risk factors: diabetes mellitus, high cholesterol and hypertension   Risk factors: no coronary artery disease and no smoking (quit 15 years ago)    61 yo M With a chief complaint of chest pain. Off and on for the past 3 days. Worse with eating and worse on exertion. Says it was shortness of breath diaphoresis and radiation down both arms. Has been sick to stomach but not vomited. Denies any lower extremity edema. History of hypertension hyperlipidemia diabetes and peripheral vascular disease. Patient went to urgent care today and was sent over after having an EKG with diffuse flipped T waves.   Past Medical History  Diagnosis Date  . Diabetes mellitus without complication ([place])   . HTN (hypertension)   . Hyperlipidemia   . PAD (peripheral artery disease) (HCC)     Stenting of the ? iliac arteries in    Past Surgical History  Procedure Laterality Date  . Hip surgery     Family History  Problem Relation Age of Onset  . Cancer - Prostate Father   . Lymphoma  Mother   . CAD Neg Hx    Social History  Substance Use Topics  . Smoking status: Former Smoker -- 2.00 packs/day for 35 years    Types: Cigarettes    Quit date: 02/19/2003  . Smokeless tobacco: None  . Alcohol Use: No    Review of Systems  Constitutional: Positive for diaphoresis and fatigue. Negative for fever and chills.  HENT: Negative for congestion and facial swelling.   Eyes: Negative for discharge and visual disturbance.  Respiratory: Positive for shortness of breath.   Cardiovascular: Positive for chest pain. Negative for palpitations.  Gastrointestinal: Negative for vomiting, abdominal pain and diarrhea.  Musculoskeletal: Negative for myalgias, back pain and arthralgias.  Skin: Negative for color change and rash.  Neurological: Negative for tremors, syncope and headaches.  Psychiatric/Behavioral: Negative for confusion and dysphoric mood.      Allergies  Review of patient's allergies indicates no known allergies.  Home Medications   Prior to Admission medications   Medication Sig Start Date End Date Taking? Authorizing Provider  aspirin 81 MG tablet Take 81 mg by mouth daily.    Historical Provider, MD  gabapentin (NEURONTIN) 300 MG capsule Take 1 capsule (300 mg total) by mouth 3 (three) times daily. 1 a week for 1 week then bid week 2 then tid 06/08/15   Ashok Norris, MD  glimepiride (AMARYL) 4 MG tablet take 1 tablet by mouth once daily 08/28/15   Ashok Norris, MD  HYDROcodone-acetaminophen (NORCO/VICODIN)  5-325 MG per tablet Take 1-2 tablets by mouth every 4 (four) hours as needed for moderate pain or severe pain. Patient not taking: Reported on 06/08/2015 01/29/14   Jarrett Soho Muthersbaugh, PA-C  lisinopril (PRINIVIL,ZESTRIL) 10 MG tablet Take 1 tablet (10 mg total) by mouth daily. 01/03/15   Ashok Norris, MD  meloxicam (MOBIC) 15 MG tablet take 1 tablet by mouth at bedtime 03/06/15   Ashok Norris, MD  metFORMIN (GLUCOPHAGE) 1000 MG tablet Take 1 tablet (1,000  mg total) by mouth 2 (two) times daily. 01/03/15   Ashok Norris, MD  metFORMIN (GLUCOPHAGE) 1000 MG tablet take 1 tablet by mouth twice a day 06/27/15   Ashok Norris, MD  simvastatin (ZOCOR) 40 MG tablet take 1 tablet by mouth once daily 08/28/15   Ashok Norris, MD   BP 152/72 mmHg  Pulse 87  Temp(Src) 98.2 F (36.8 C) (Oral)  Resp 18  SpO2 98% Physical Exam  Constitutional: He is oriented to person, place, and time. He appears well-developed and well-nourished.  HENT:  Head: Normocephalic and atraumatic.  Eyes: EOM are normal. Pupils are equal, round, and reactive to light.  Neck: Normal range of motion. Neck supple. No JVD present.  Cardiovascular: Normal rate, regular rhythm and intact distal pulses.  Exam reveals no gallop and no friction rub.   No murmur heard. Pulmonary/Chest: No respiratory distress. He has no wheezes. He exhibits no tenderness.  Abdominal: He exhibits no distension. There is no tenderness. There is no rebound and no guarding.  Musculoskeletal: Normal range of motion. He exhibits no edema or tenderness.  Neurological: He is alert and oriented to person, place, and time.  Skin: No rash noted. No pallor.  Psychiatric: He has a normal mood and affect. His behavior is normal.  Nursing note and vitals reviewed.   ED Course  Procedures (including critical care time) Labs Review Labs Reviewed  BASIC METABOLIC PANEL - Abnormal; Notable for the following:    Glucose, Bld 212 (*)    All other components within normal limits  I-STAT TROPOININ, ED - Abnormal; Notable for the following:    Troponin i, poc 1.01 (*)    All other components within normal limits  CBC    Imaging Review Dg Chest 2 View  09/04/2015  CLINICAL DATA:  Chest pain radiating into left arm for 4 days. Shortness of breath. EXAM: CHEST  2 VIEW COMPARISON:  None. FINDINGS: Lungs are clear. Heart size and pulmonary vascularity are normal. No adenopathy. No bone lesions. No pneumothorax.  IMPRESSION: No edema or consolidation. Electronically Signed   By: Lowella Grip III M.D.   On: 09/04/2015 18:57   I have personally reviewed and evaluated these images and lab results as part of my medical decision-making.   EKG Interpretation   Date/Time:  Monday September 04 2015 18:15:55 EDT Ventricular Rate:  87 PR Interval:  210 QRS Duration: 76 QT Interval:  390 QTC Calculation: 469 R Axis:   78 Text Interpretation:  Sinus rhythm with 1st degree A-V block ST' \\T'$ \ T wave  abnormality, consider anterolateral ischemia Prolonged QT Abnormal ECG TWI  in anterior leads no prior for comparison  Confirmed by LIU MD, DANA  ([id number]) on 09/04/2015 6:57:25 PM      MDM   Final diagnoses:  NSTEMI (non-ST elevated myocardial infarction) [facility])    60 yo M with a chief complaint chest pain. Patient's story is concerning for ACS. Troponin is positive at 1. Will start on a heparin drip. Currently asymptomatic.  Given 324 of aspirin. Cardiology consult  CRITICAL CARE Performed by: Cecilio Asper   Total critical care time: 35 minutes  Critical care time was exclusive of separately billable procedures and treating other patients.  Critical care was necessary to treat or prevent imminent or life-threatening deterioration.  Critical care was time spent personally by me on the following activities: development of treatment plan with patient and/or surrogate as well as nursing, discussions with consultants, evaluation of patient's response to treatment, examination of patient, obtaining history from patient or surrogate, ordering and performing treatments and interventions, ordering and review of laboratory studies, ordering and review of radiographic studies, pulse oximetry and re-evaluation of patient's condition.  The patients results and plan were reviewed and discussed.   Any x-rays performed were independently reviewed by myself.   Differential diagnosis were considered with the  presenting HPI.  Medications  heparin ADULT infusion 100 units/mL (25000 units/250 mL) (1,250 Units/hr Intravenous Transfusing/Transfer 09/04/15 2103)  simvastatin (ZOCOR) tablet 40 mg (not administered)  gabapentin (NEURONTIN) capsule 300 mg (300 mg Oral Given 09/04/15 2224)  lisinopril (PRINIVIL,ZESTRIL) tablet 10 mg (not administered)  sodium chloride flush (NS) 0.9 % injection 3 mL (3 mLs Intravenous Given 09/04/15 2215)  sodium chloride flush (NS) 0.9 % injection 3 mL (not administered)  0.9 %  sodium chloride infusion (not administered)  aspirin chewable tablet 324 mg (324 mg Oral Not Given 09/04/15 2215)    Or  aspirin suppository 300 mg ( Rectal See Alternative 09/04/15 2215)  nitroGLYCERIN (NITROSTAT) SL tablet 0.4 mg (not administered)  acetaminophen (TYLENOL) tablet 650 mg (not administered)  ondansetron (ZOFRAN) injection 4 mg (not administered)  carvedilol (COREG) tablet 3.125 mg (3.125 mg Oral Given 09/04/15 2223)  sodium chloride flush (NS) 0.9 % injection 3 mL (3 mLs Intravenous Not Given 09/04/15 2215)  sodium chloride flush (NS) 0.9 % injection 3 mL (not administered)  0.9 %  sodium chloride infusion (not administered)  aspirin chewable tablet 81 mg (not administered)  0.9% sodium chloride infusion (not administered)    Followed by  0.9% sodium chloride infusion (not administered)  aspirin EC tablet 81 mg (not administered)  aspirin chewable tablet 324 mg (324 mg Oral Given 09/04/15 1940)  heparin bolus via infusion 4,000 Units (4,000 Units Intravenous Given 09/04/15 1949)    Filed Vitals:   09/04/15 1812 09/04/15 1930 09/04/15 2157  BP: 152/72 165/80 142/73  Pulse: 87 79 84  Temp: 98.2 F (36.8 C)  98.1 F (36.7 C)  TempSrc: Oral  Oral  Resp: 18 17   Height:   '6\' 3"'$  (1.905 m)  Weight:   232 lb (105.235 kg)  SpO2: 98% 99% 96%    Final diagnoses:  NSTEMI (non-ST elevated myocardial infarction) [facility] [place])    Admission/ observation were discussed with the admitting  physician, patient and/or family and they are comfortable with the plan.     Deno Etienne, DO 09/04/15 2341

## 2015-09-04 NOTE — ED Provider Notes (Signed)
CSN: [id number]     Arrival date & time 09/04/15  1628 History   First MD Initiated Contact with Patient 09/04/15 1649     Chief Complaint  Patient presents with  . Chest Pain   (Consider location/radiation/quality/duration/timing/severity/associated sxs/prior Treatment) HPI Onset of midsternal chest pain about 4 days ago. States that he has had some trouble sleeping in a supine position because of the pain. He also notice that while trying to mow grass, he develop a tightness and pressure in his chest that radiated to both arms, lasted about 20 mins. No history of cardiac dz. Has had LE stents placed for PAD>  Past Medical History  Diagnosis Date  . Diabetes mellitus without complication ([place])   . HTN (hypertension)   . Hyperlipidemia   . PAD (peripheral artery disease) (HCC)     Stenting of the ? iliac arteries in    Past Surgical History  Procedure Laterality Date  . Hip surgery     Family History  Problem Relation Age of Onset  . Cancer - Prostate Father   . Lymphoma Mother   . CAD Neg Hx    Social History  Substance Use Topics  . Smoking status: Former Smoker -- 2.00 packs/day for 35 years    Types: Cigarettes    Quit date: 02/19/2003  . Smokeless tobacco: None  . Alcohol Use: No    Review of Systems Chest pain Allergies  Review of patient's allergies indicates no known allergies.  Home Medications   Prior to Admission medications   Medication Sig Start Date End Date Taking? Authorizing Provider  aspirin 81 MG tablet Take 81 mg by mouth daily.    Historical Provider, MD  gabapentin (NEURONTIN) 300 MG capsule Take 1 capsule (300 mg total) by mouth 3 (three) times daily. 1 a week for 1 week then bid week 2 then tid 06/08/15   Ashok Norris, MD  glimepiride (AMARYL) 4 MG tablet take 1 tablet by mouth once daily 08/28/15   Ashok Norris, MD  HYDROcodone-acetaminophen (NORCO/VICODIN) 5-325 MG per tablet Take 1-2 tablets by mouth every 4 (four) hours as needed for  moderate pain or severe pain. Patient not taking: Reported on 06/08/2015 01/29/14   Jarrett Soho Muthersbaugh, PA-C  lisinopril (PRINIVIL,ZESTRIL) 10 MG tablet Take 1 tablet (10 mg total) by mouth daily. 01/03/15   Ashok Norris, MD  meloxicam (MOBIC) 15 MG tablet take 1 tablet by mouth at bedtime 03/06/15   Ashok Norris, MD  metFORMIN (GLUCOPHAGE) 1000 MG tablet Take 1 tablet (1,000 mg total) by mouth 2 (two) times daily. 01/03/15   Ashok Norris, MD  metFORMIN (GLUCOPHAGE) 1000 MG tablet take 1 tablet by mouth twice a day 06/27/15   Ashok Norris, MD  simvastatin (ZOCOR) 40 MG tablet take 1 tablet by mouth once daily 08/28/15   Ashok Norris, MD   Meds Ordered and Administered this Visit  Medications - No data to display  BP 161/74 mmHg  Pulse 88  Temp(Src) 98.2 F (36.8 C) (Oral)  Resp 16  SpO2 98% No data found.   Physical Exam NURSES NOTES AND VITAL SIGNS REVIEWED. CONSTITUTIONAL: Well developed, well nourished, no acute distress HEENT: normocephalic, atraumatic, right and left TM's are normal EYES: Conjunctiva normal NECK:normal ROM, supple, no adenopathy PULMONARY:No respiratory distress, normal effort, Lungs: CTAb/l, no wheezes, or increased work of breathing CARDIOVASCULAR: RRR, no murmur ABDOMEN: soft, ND, NT, +'ve BS MUSCULOSKELETAL: Normal ROM of all extremities,  SKIN: warm and dry without rash PSYCHIATRIC: Mood and affect,  behavior are normal  ED Course  Procedures (including critical care time)  Labs Review Labs Reviewed - No data to display  Imaging Review No results found.   Visual Acuity Review  Right Eye Distance:   Left Eye Distance:   Bilateral Distance:    Right Eye Near:   Left Eye Near:    Bilateral Near:     Review of ECG does not demonstrate AMI, there are changes of possible anterolateral ischemia.   Patient is pain free at this time.     MDM   1. Chest pain of unknown etiology       You are advised to go to the ER for evaluation  today     Konrad Felix, [place] 09/04/15 1753

## 2015-09-04 NOTE — ED Notes (Signed)
Pt reports mid chest pain since Friday, radiates into left arm. Having mild nausea. Denies sob. Pt went to ucc and sent here for further eval.

## 2015-09-04 NOTE — ED Notes (Signed)
Pt  Reports   Symptoms  Of  Epigastric /chest  Pain  X  4  Days        Not  Hurting  At  This time / pt  Is   Sitting  Upright  On the  Exam table      He  Is awake  And  Alert    And  Oriented

## 2015-09-04 NOTE — Discharge Instructions (Signed)
Acute Coronary Syndrome Acute coronary syndrome (ACS) is a serious problem in which there is suddenly not enough blood and oxygen supplied to the heart. ACS may mean that one or more of the blood vessels in your heart (coronary arteries) may be blocked. ACS can result in chest pain or a heart attack (myocardial infarction or MI). CAUSES This condition is caused by atherosclerosis, which is the buildup of fat and cholesterol (plaque) on the inside of the arteries. Over time, the plaque may narrow or block the artery, and this will lessen blood flow to the heart. Plaque can also become weak and break off within a coronary artery to form a clot and cause a sudden blockage. RISK FACTORS The risks factors of this condition include:  High cholesterol levels.  High blood pressure (hypertension).  Smoking.  Diabetes.  Age.  Family history of chest pain, heart disease, or stroke.  Lack of exercise. SYMPTOMS The most common signs of this condition include:  Chest pain, which can be:  A crushing or squeezing in the chest.  A tightness, pressure, fullness, or heaviness in the chest.  Present for more than a few minutes, or it can stop and recur.  Pain in the arms, neck, jaw, or back.  Unexplained heartburn or indigestion.  Shortness of breath.  Nausea.  Sudden cold sweats.  Feeling light-headed or dizzy. Sometimes, this condition has no symptoms. DIAGNOSIS ACS may be diagnosed through the following tests:  Electrocardiogram (ECG).  Blood tests.  Coronary angiogram. This is a procedure to look at the coronary arteries to see if there is any blockage. TREATMENT Treatment for ACS may include:  Healthy behavioral changes to reduce or control risk factors.  Medicine.  Coronary stenting.A stent helps to keep an artery open.  Coronary angioplasty. This procedure widens a narrowed or blocked artery.  Coronary artery bypass surgery. This will allow your blood to pass the  blockage (bypass) to reach your heart. HOME CARE INSTRUCTIONS Eating and Drinking  Follow a heart-healthy diet. A dietitian can you help to educate you about healthy food options and changes.  Use healthy cooking methods such as roasting, grilling, broiling, baking, poaching, steaming, or stir-frying. Talk to a dietitian to learn more about healthy cooking methods. Medicines  Take medicines only as directed by your health care provider.  Do not take the following medicines unless your health care provider approves:  Nonsteroidal anti-inflammatory drugs (NSAIDs), such as ibuprofen, naproxen, or celecoxib.  Vitamin supplements that contain vitamin A, vitamin E, or both.  Hormone replacement therapy that contains estrogen with or without progestin.  Stop illegal drug use. Activities  Follow an exercise program that is approved by your health care provider.  Plan rest periods when you are fatigued. Lifestyle  Do not use any tobacco products, including cigarettes, chewing tobacco, or electronic cigarettes. If you need help quitting, ask your health care provider.  If you drink alcohol, and your health care provider approves, limit your alcohol intake to no more than 1 drink per day. One drink equals 12 ounces of beer, 5 ounces of wine, or 1 ounces of hard liquor.  Learn to manage stress.  Maintain a healthy weight. Lose weight as approved by your health care provider. General Instructions  Manage other health conditions, such as hypertension and diabetes, as directed by your health care provider.  Keep all follow-up visits as directed by your health care provider. This is important.  Your health care provider may ask you to monitor your blood  pressure. A blood pressure reading consists of a higher number over a lower number, such as 110 over 72, written as 110/72. Ideally, your blood pressure should be:  Below 140/90 if you have no other medical conditions.  Below 130/80 if  you have diabetes or kidney disease. SEEK IMMEDIATE MEDICAL CARE IF:  You have pain in your chest, neck, arm, jaw, stomach, or back that lasts more than a few minutes, is recurring, or is not relieved by taking medicine under your tongue (sublingual nitroglycerin).  You have profuse sweating without cause.  You have unexplained:  Heartburn or indigestion.  Shortness of breath or difficulty breathing.  Nausea or vomiting.  Fatigue.  Feelings of nervousness or anxiety.  Weakness.  Diarrhea.  You have sudden light-headedness or dizziness.  You faint. These symptoms may represent a serious problem that is an emergency. Do not wait to see if the symptoms will go away. Get medical help right away. Call your local emergency services (911 in the [place] Do not drive yourself to the clinic or hospital.   This information is not intended to replace advice given to you by your health care provider. Make sure you discuss any questions you have with your health care provider.   Document Released: 05/13/2005 Document Revised: 06/03/2014 Document Reviewed: 09/14/2013 Elsevier Interactive Patient Education 2016 Elsevier Inc. Angina Pectoris Angina pectoris is a very bad feeling in the chest, neck, or arm. Your doctor may call it angina. There are four types of angina. Angina is caused by a lack of blood in the middle and thickest layer of the heart wall (myocardium). Angina may feel like a crushing or squeezing pain in the chest. It may feel like tightness or heavy pressure in the chest. Some people say it feels like gas, heartburn, or indigestion. Some people have symptoms other than pain. These include:  Shortness of breath.  Cold sweats.  Feeling sick to your stomach (nausea).  Feeling light-headed. Many women have chest discomfort and some of the other symptoms. However, women often have different symptoms, such as:  Feeling tired (fatigue).  Feeling nervous for no  reason.  Feeling weak for no reason.  Dizziness or fainting. Women may have angina without any symptoms. HOME CARE  Take medicines only as told by your doctor.  Take care of other health issues as told by your doctor. These include:  High blood pressure (hypertension).  Diabetes.  Follow a heart-healthy diet. Your doctor can help you to choose healthy food options and make changes.  Talk to your doctor to learn more about healthy cooking methods and use them. These include:  Roasting.  Grilling.  Broiling.  Baking.  Poaching.  Steaming.  Stir-frying.  Follow an exercise program approved by your doctor.  Keep a healthy weight. Lose weight as told by your doctor.  Rest when you are tired.  Learn to manage stress.  Do not use any tobacco, such as cigarettes, chewing tobacco, or electronic cigarettes. If you need help quitting, ask your doctor.  If you drink alcohol, and your doctor says it is okay, limit yourself to no more than 1 drink per day. One drink equals 12 ounces of beer, 5 ounces of wine, or 1 ounces of hard liquor.  Stop illegal drug use.  Keep all follow-up visits as told by your doctor. This is important. Do not take these medicines unless your doctor says that you can:  Nonsteroidal anti-inflammatory drugs (NSAIDs). These include:  Ibuprofen.  Naproxen.  Celecoxib.  Vitamin supplements that have vitamin A, vitamin E, or both.  Hormone therapy that contains estrogen with or without progestin. GET HELP RIGHT AWAY IF:  You have pain in your chest, neck, arm, jaw, stomach, or back that:  Lasts more than a few minutes.  Comes back.  Does not get better after you take medicine under your tongue (sublingual nitroglycerin).  You have any of these symptoms for no reason:  Gas, heartburn, or indigestion.  Sweating a lot.  Shortness of breath or trouble breathing.  Feeling sick to your stomach or throwing up.  Feeling more tired than  usual.  Feeling nervous or worrying more than usual.  Feeling weak.  Diarrhea.  You are suddenly dizzy or light-headed.  You faint or pass out. These symptoms may be an emergency. Do not wait to see if the symptoms will go away. Get medical help right away. Call your local emergency services (911 in the [place] Do not drive yourself to the hospital.   This information is not intended to replace advice given to you by your health care provider. Make sure you discuss any questions you have with your health care provider.   Document Released: 10/30/2007 Document Revised: 09/27/2014 Document Reviewed: 09/14/2013 Elsevier Interactive Patient Education [employer].

## 2015-09-04 NOTE — ED Notes (Signed)
Attempted report 

## 2015-09-04 NOTE — Progress Notes (Signed)
ANTICOAGULATION CONSULT NOTE - Initial Consult  Pharmacy Consult for Heparin Indication: chest pain/ACS  No Known Allergies  Patient Measurements:   Heparin Dosing Weight: 103.4  Vital Signs: Temp: 98.2 F (36.8 C) (04/10 1812) Temp Source: Oral (04/10 1812) BP: 152/72 mmHg (04/10 1812) Pulse Rate: 87 (04/10 1812)  Labs:  Recent Labs  09/04/15 1821  HGB 13.8  HCT 40.2  PLT 266  CREATININE 0.87    CrCl cannot be calculated (Unknown ideal weight.).   Medical History: Past Medical History  Diagnosis Date  . Diabetes mellitus without complication ([place])   . HTN (hypertension)   . Hyperlipidemia   . PAD (peripheral artery disease) (HCC)     Stenting of the ? iliac arteries in     Assessment: 50-yo male presents with chest pain for 4 days, developing into tightness and radiating pressure with exertion.  PMH includes T2DM, HTN, HLD, and PAD.  Pharmacy consulted to initiate heparin in the setting of chest pain/ACS.    Hgb 13.8, PLT 266, SCr 0.87, Trop 1.01 No PTA oral anticoagulation  Goal of Therapy:  Heparin level 0.3-0.7 units/ml Monitor platelets by anticoagulation protocol: Yes   Plan:  Give 4000 units bolus x 1 Start heparin infusion at 1250 units/hr Check anti-Xa level in 6 hours and daily while on heparin Continue to monitor H&H and platelets  Viann Fish 09/04/2015,7:38 PM

## 2015-09-04 NOTE — H&P (Signed)
Patient ID: Kirk [name] MRN: [id number], DOB/AGE: 09/11/1955   Admit date: 09/04/2015  Requesting Physician:  Primary Physician: Ashok Norris, MD Primary Cardiologist: Dr. Julianne Handler Reason for admission: NSTEMI  Pt. Profile:  KOLLYN LINGAFELTER is a 60 y.o. male with a history of PAD s/p bilateral LE stenting (2003), HLD, HTN, DMT2, previous tobacco abuse and no previous cardiac disease who presented to [facility] today with chest pain and found to have NSTEMI.   He established care with Dr. Julianne Handler in 01/2013. He did not wish to pursue any ischemic testing at that time due to having no insurance. I do not see that he ever followed up after 2014.  He was in his usual state of health until last Friday when he noticed chest pain that he thought was indigestion. His chest pain was so severe that he thought he might vomit. It was associated with diaphoresis and shortness of breath and radiates to the left arm. The chest pain has occurred intermittently since that time. He had chest pain on Saturday night and again on Sunday night that was made worse with me laying flat. He also had chest pain today. It usually lasts 10-15 minutes and then eases off on its own. It is made worse with exertion and better at rest.  He denies lower extremity edema, orthopnea or PND. No dizziness or syncope.  He called his primary care doctor today who was out and their office told him to go to the urgent care and he was finally directed to the ER. In the ER his troponin was noted to be elevated consistent with an STEMI and cardiology was asked for admission. He is currently chest pain-free. He takes all of his medicines at home including lisinopril, aspirin 81 mg daily and statin.   Problem List  Past Medical History  Diagnosis Date  . Diabetes mellitus without complication ([place])   . HTN (hypertension)   . Hyperlipidemia   . PAD (peripheral artery disease) (HCC)     Stenting of the ? iliac arteries in       Past Surgical History  Procedure Laterality Date  . Hip surgery       Allergies  No Known Allergies   Home Medications  Prior to Admission medications   Medication Sig Start Date End Date Taking? Authorizing Provider  aspirin 81 MG tablet Take 81 mg by mouth daily.    Historical Provider, MD  gabapentin (NEURONTIN) 300 MG capsule Take 1 capsule (300 mg total) by mouth 3 (three) times daily. 1 a week for 1 week then bid week 2 then tid 06/08/15   Ashok Norris, MD  glimepiride (AMARYL) 4 MG tablet take 1 tablet by mouth once daily 08/28/15   Ashok Norris, MD  HYDROcodone-acetaminophen (NORCO/VICODIN) 5-325 MG per tablet Take 1-2 tablets by mouth every 4 (four) hours as needed for moderate pain or severe pain. Patient not taking: Reported on 06/08/2015 01/29/14   Jarrett Soho Muthersbaugh, PA-C  lisinopril (PRINIVIL,ZESTRIL) 10 MG tablet Take 1 tablet (10 mg total) by mouth daily. 01/03/15   Ashok Norris, MD  meloxicam (MOBIC) 15 MG tablet take 1 tablet by mouth at bedtime 03/06/15   Ashok Norris, MD  metFORMIN (GLUCOPHAGE) 1000 MG tablet Take 1 tablet (1,000 mg total) by mouth 2 (two) times daily. 01/03/15   Ashok Norris, MD  metFORMIN (GLUCOPHAGE) 1000 MG tablet take 1 tablet by mouth twice a day 06/27/15   Ashok Norris, MD  simvastatin (ZOCOR) 40 MG tablet  take 1 tablet by mouth once daily 08/28/15   Ashok Norris, MD    Family History  Family History  Problem Relation Age of Onset  . Cancer - Prostate Father   . Lymphoma Mother   . CAD Neg Hx    Family Status  Relation Status Death Age  . Mother Deceased   . Father Deceased      Social History  Social History   Social History  . Marital Status: Divorced    Spouse Name: N/A  . Number of Children: 1  . Years of Education: N/A   Occupational History  . Furniture salesman    Social History Main Topics  . Smoking status: Former Smoker -- 2.00 packs/day for 35 years    Types: Cigarettes    Quit date: 02/19/2003   . Smokeless tobacco: Not on file  . Alcohol Use: No  . Drug Use: No  . Sexual Activity: Not on file   Other Topics Concern  . Not on file   Social History Narrative     All other systems reviewed and are otherwise negative except as noted above.  Physical Exam  Blood pressure 152/72, pulse 87, temperature 98.2 F (36.8 C), temperature source Oral, resp. rate 18, SpO2 98 %.  General: Pleasant, NAD Psych: Normal affect. Neuro: Alert and oriented X 3. Moves all extremities spontaneously. HEENT: Normal  Neck: Supple without bruits or JVD. Lungs:  Resp regular and unlabored, CTA. Heart: RRR no s3, s4, or murmurs. Abdomen: Soft, non-tender, non-distended, BS + x 4.  Extremities: No clubbing, cyanosis or edema. DP/PT/Radials 2+ and equal bilaterally.  Labs  No results for input(s): CKTOTAL, CKMB, TROPONINI in the last 72 hours. Lab Results  Component Value Date   WBC 7.8 09/04/2015   HGB 13.8 09/04/2015   HCT 40.2 09/04/2015   MCV 89.5 09/04/2015   PLT 266 09/04/2015    Recent Labs Lab 09/04/15 1821  NA 141  K 4.0  CL 104  CO2 27  BUN 10  CREATININE 0.87  CALCIUM 10.1  GLUCOSE 212*   Lab Results  Component Value Date   CHOL 164 06/08/2015   HDL 35* 06/08/2015   LDLCALC 101* 06/08/2015   TRIG 138 06/08/2015   No results found for: DDIMER   Radiology/Studies  Dg Chest 2 View  09/04/2015  CLINICAL DATA:  Chest pain radiating into left arm for 4 days. Shortness of breath. EXAM: CHEST  2 VIEW COMPARISON:  None. FINDINGS: Lungs are clear. Heart size and pulmonary vascularity are normal. No adenopathy. No bone lesions. No pneumothorax. IMPRESSION: No edema or consolidation. Electronically Signed   By: Lowella Grip III M.D.   On: 09/04/2015 18:57    ECG  Sinus with 1st deg AV block with TWI in Anterolateral leads  ASSESSMENT AND PLAN   ZYION DOXTATER is a 60 y.o. male with a history of PAD s/p bilateral LE stenting (2003), HLD, HTN, DMT2, previous  tobacco abuse and no previous cardiac disease who presented to [facility] today with chest pain and found to have NSTEMI.   NSTEMI: troponin 1.01. ECG with T-wave inversions in anterolateral leads. -- Placed on IV heparin. He will need a left heart cath. NPO after midnight -- Continue ASA and statin. Will add low dose Coreg 3.'125mg'$  BID. -- Currently chest pain-free.  HTN: continue Lisinopril '10mg'$  daily. Will add low dose Coreg 3.'125mg'$  BID as above.  DMT2: Will hold metformin for heart cath. Will place sliding scale insulin.  PAD:  s/p stenting. Stable. Continue ASA and statin   Renea Ee 09/04/2015, 7:43 PM  Pager [phone] Attending Note  Patient seen and discussed with PA Grandville Silos, I agree with her documentation above. History of PAD with prior interventions, HL, HTN, DM2 admitted with chest pain. EKG with anterior and anterolateral TWI, troponin 1.01. He will be admitted for management of NSTEMI with plans for cath tomorrow AM. Medically treat with ASA, beta blocker, ACE-I, high dose statin, and heparin gtt. Defer additional antiplatelet to time of cath. Add FLP and HgbA1c to AM labs.    Zandra Abts MD

## 2015-09-05 ENCOUNTER — Encounter (HOSPITAL_COMMUNITY)
Admission: EM | Disposition: A | Discharge status: Home / Self Care | Payer: Self-pay | Source: Home / Self Care | Attending: Cardiology

## 2015-09-05 ENCOUNTER — Other Ambulatory Visit: Payer: Self-pay

## 2015-09-05 DIAGNOSIS — I10 Essential (primary) hypertension: Secondary | ICD-10-CM

## 2015-09-05 DIAGNOSIS — I2510 Atherosclerotic heart disease of native coronary artery without angina pectoris: Secondary | ICD-10-CM

## 2015-09-05 DIAGNOSIS — E1151 Type 2 diabetes mellitus with diabetic peripheral angiopathy without gangrene: Secondary | ICD-10-CM

## 2015-09-05 HISTORY — PX: CARDIAC CATHETERIZATION: SHX172

## 2015-09-05 LAB — GLUCOSE, CAPILLARY
GLUCOSE-CAPILLARY: 150 mg/dL — AB (ref 65–99)
Glucose-Capillary: 136 mg/dL — ABNORMAL HIGH (ref 65–99)
Glucose-Capillary: 135 mg/dL — ABNORMAL HIGH (ref 65–99)

## 2015-09-05 LAB — LIPID PANEL
CHOL/HDL RATIO: 4.8 ratio
CHOLESTEROL: 158 mg/dL (ref 0–200)
HDL: 33 mg/dL — AB (ref >40)
LDL Cholesterol: 90 mg/dL (ref 0–99)
Triglycerides: 174 mg/dL — ABNORMAL HIGH (ref <150)
VLDL: 35 mg/dL (ref 0–40)

## 2015-09-05 LAB — COMPREHENSIVE METABOLIC PANEL
ALBUMIN: 3.8 g/dL (ref 3.5–5.0)
ALT: 24 U/L (ref 17–63)
ANION GAP: 11 (ref 5–15)
AST: 25 U/L (ref 15–41)
Alkaline Phosphatase: 50 U/L (ref 38–126)
BUN: 11 mg/dL (ref 6–20)
CHLORIDE: 104 mmol/L (ref 101–111)
CO2: 25 mmol/L (ref 22–32)
Calcium: 9.4 mg/dL (ref 8.9–10.3)
Creatinine, Ser: 0.89 mg/dL (ref 0.61–1.24)
GFR calc Af Amer: >60 mL/min (ref >60)
GFR calc non Af Amer: >60 mL/min (ref >60)
GLUCOSE: 192 mg/dL — AB (ref 65–99)
POTASSIUM: 3.6 mmol/L (ref 3.5–5.1)
Sodium: 140 mmol/L (ref 135–145)
Total Bilirubin: 0.6 mg/dL (ref 0.3–1.2)
Total Protein: 6.9 g/dL (ref 6.5–8.1)

## 2015-09-05 LAB — TROPONIN I
Troponin I: 1.32 ng/mL (ref <0.031)
Troponin I: 1.05 ng/mL (ref <0.031)

## 2015-09-05 LAB — HEPARIN LEVEL (UNFRACTIONATED)
HEPARIN UNFRACTIONATED: 0.32 [IU]/mL (ref 0.30–0.70)
Heparin Unfractionated: 0.42 IU/mL (ref 0.30–0.70)

## 2015-09-05 LAB — PROTIME-INR
INR: 1.19 (ref 0.00–1.49)
PROTHROMBIN TIME: 15.3 s — AB (ref 11.6–15.2)

## 2015-09-05 LAB — CBC
HEMATOCRIT: 38.9 % — AB (ref 39.0–52.0)
HEMOGLOBIN: 12.7 g/dL — AB (ref 13.0–17.0)
MCH: 29.3 pg (ref 26.0–34.0)
MCHC: 32.6 g/dL (ref 30.0–36.0)
MCV: 89.6 fL (ref 78.0–100.0)
Platelets: 237 10*3/uL (ref 150–400)
RBC: 4.34 MIL/uL (ref 4.22–5.81)
RDW: 12.8 % (ref 11.5–15.5)
WBC: 9.4 10*3/uL (ref 4.0–10.5)

## 2015-09-05 LAB — TSH: TSH: 1.083 u[IU]/mL (ref 0.350–4.500)

## 2015-09-05 LAB — POCT ACTIVATED CLOTTING TIME: Activated Clotting Time: 430 seconds

## 2015-09-05 SURGERY — LEFT HEART CATH AND CORONARY ANGIOGRAPHY
Anesthesia: LOCAL

## 2015-09-05 MED ORDER — NITROGLYCERIN 1 MG/10 ML FOR IR/CATH LAB
INTRA_ARTERIAL | Status: DC | PRN
  Administered 2015-09-05 (×2): 200 ug via INTRA_ARTERIAL

## 2015-09-05 MED ORDER — SODIUM CHLORIDE 0.9 % IV SOLN
250.0000 mg | INTRAVENOUS | Status: DC | PRN
  Administered 2015-09-05 (×2): 1.75 mg/kg/h via INTRAVENOUS

## 2015-09-05 MED ORDER — BIVALIRUDIN 250 MG IV SOLR
INTRAVENOUS | Status: AC
  Filled 2015-09-05: qty 250

## 2015-09-05 MED ORDER — IOPAMIDOL (ISOVUE-370) INJECTION 76%
INTRAVENOUS | Status: AC
  Filled 2015-09-05: qty 100

## 2015-09-05 MED ORDER — VERAPAMIL HCL 2.5 MG/ML IV SOLN
INTRA_ARTERIAL | Status: DC | PRN
  Administered 2015-09-05: 20 mL via INTRA_ARTERIAL

## 2015-09-05 MED ORDER — ONDANSETRON HCL 4 MG/2ML IJ SOLN: 4.0000 mg | Freq: Four times a day (QID) | INTRAMUSCULAR | Status: DC | PRN

## 2015-09-05 MED ORDER — SODIUM CHLORIDE 0.9 % IV SOLN: 250.0000 mL | INTRAVENOUS | Status: DC | PRN

## 2015-09-05 MED ORDER — HEPARIN SODIUM (PORCINE) 1000 UNIT/ML IJ SOLN
INTRAMUSCULAR | Status: AC
  Filled 2015-09-05: qty 1

## 2015-09-05 MED ORDER — NITROGLYCERIN IN D5W 200-5 MCG/ML-% IV SOLN
INTRAVENOUS | Status: AC
  Filled 2015-09-05: qty 250

## 2015-09-05 MED ORDER — FENTANYL CITRATE (PF) 100 MCG/2ML IJ SOLN
INTRAMUSCULAR | Status: DC | PRN
  Administered 2015-09-05 (×2): 25 ug via INTRAVENOUS
  Administered 2015-09-05: 50 ug via INTRAVENOUS

## 2015-09-05 MED ORDER — MIDAZOLAM HCL 2 MG/2ML IJ SOLN
INTRAMUSCULAR | Status: AC
  Filled 2015-09-05: qty 2

## 2015-09-05 MED ORDER — FENTANYL CITRATE (PF) 100 MCG/2ML IJ SOLN
INTRAMUSCULAR | Status: AC
  Filled 2015-09-05: qty 2

## 2015-09-05 MED ORDER — NITROGLYCERIN IN D5W 200-5 MCG/ML-% IV SOLN
INTRAVENOUS | Status: DC | PRN
  Administered 2015-09-05: 10 ug/min via INTRAVENOUS

## 2015-09-05 MED ORDER — IOPAMIDOL (ISOVUE-370) INJECTION 76%
INTRAVENOUS | Status: AC
Start: 2015-09-05 — End: 2015-09-05
  Filled 2015-09-05: qty 100

## 2015-09-05 MED ORDER — ATORVASTATIN CALCIUM 80 MG PO TABS
80.0000 mg | ORAL_TABLET | Freq: Every day | ORAL | Status: DC
  Administered 2015-09-05: 80 mg via ORAL
  Filled 2015-09-05: qty 1

## 2015-09-05 MED ORDER — LIDOCAINE HCL (PF) 1 % IJ SOLN
INTRAMUSCULAR | Status: AC
  Filled 2015-09-05: qty 30

## 2015-09-05 MED ORDER — TICAGRELOR 90 MG PO TABS
ORAL_TABLET | ORAL | Status: AC
  Filled 2015-09-05: qty 2

## 2015-09-05 MED ORDER — MORPHINE SULFATE (PF) 2 MG/ML IV SOLN: 2.0000 mg | INTRAVENOUS | Status: DC | PRN

## 2015-09-05 MED ORDER — MIDAZOLAM HCL 2 MG/2ML IJ SOLN
INTRAMUSCULAR | Status: DC | PRN
Start: 2015-09-05 — End: 2015-09-05
  Administered 2015-09-05 (×2): 1 mg via INTRAVENOUS
  Administered 2015-09-05: 2 mg via INTRAVENOUS

## 2015-09-05 MED ORDER — HEART ATTACK BOUNCING BOOK
Freq: Once | Status: AC
  Administered 2015-09-05: 22:00:00
  Filled 2015-09-05: qty 1

## 2015-09-05 MED ORDER — TICAGRELOR 90 MG PO TABS
90.0000 mg | ORAL_TABLET | Freq: Two times a day (BID) | ORAL | Status: DC
  Administered 2015-09-06: 90 mg via ORAL
  Filled 2015-09-05: qty 1

## 2015-09-05 MED ORDER — SODIUM CHLORIDE 0.9% FLUSH: 3.0000 mL | INTRAVENOUS | Status: DC | PRN

## 2015-09-05 MED ORDER — ACETAMINOPHEN 325 MG PO TABS: 650.0000 mg | ORAL_TABLET | ORAL | Status: DC | PRN

## 2015-09-05 MED ORDER — ASPIRIN EC 81 MG PO TBEC
81.0000 mg | DELAYED_RELEASE_TABLET | Freq: Every day | ORAL | Status: DC
Start: 2015-09-05 — End: 2015-09-05

## 2015-09-05 MED ORDER — NITROGLYCERIN 1 MG/10 ML FOR IR/CATH LAB
INTRA_ARTERIAL | Status: AC
  Filled 2015-09-05: qty 10

## 2015-09-05 MED ORDER — VERAPAMIL HCL 2.5 MG/ML IV SOLN
INTRAVENOUS | Status: AC
  Filled 2015-09-05: qty 2

## 2015-09-05 MED ORDER — HEPARIN (PORCINE) IN NACL 2-0.9 UNIT/ML-% IJ SOLN
INTRAMUSCULAR | Status: DC | PRN
  Administered 2015-09-05: 500 mL

## 2015-09-05 MED ORDER — HEPARIN SODIUM (PORCINE) 1000 UNIT/ML IJ SOLN
INTRAMUSCULAR | Status: DC | PRN
  Administered 2015-09-05: 5000 [IU] via INTRAVENOUS

## 2015-09-05 MED ORDER — SODIUM CHLORIDE 0.9 % IV SOLN
INTRAVENOUS | Status: DC
  Administered 2015-09-05: 21:00:00 125 mL/h via INTRAVENOUS

## 2015-09-05 MED ORDER — SODIUM CHLORIDE 0.9% FLUSH: 3.0000 mL | Freq: Two times a day (BID) | INTRAVENOUS | Status: DC

## 2015-09-05 MED ORDER — HEPARIN (PORCINE) IN NACL 2-0.9 UNIT/ML-% IJ SOLN
INTRAMUSCULAR | Status: DC | PRN
  Administered 2015-09-05: 1000 mL

## 2015-09-05 MED ORDER — IOPAMIDOL (ISOVUE-370) INJECTION 76%
INTRAVENOUS | Status: DC | PRN
  Administered 2015-09-05: 290 mL via INTRAVENOUS

## 2015-09-05 MED ORDER — MIDAZOLAM HCL 2 MG/2ML IJ SOLN
INTRAMUSCULAR | Status: AC
Start: 2015-09-05 — End: 2015-09-05
  Filled 2015-09-05: qty 2

## 2015-09-05 MED ORDER — TICAGRELOR 90 MG PO TABS
ORAL_TABLET | ORAL | Status: DC | PRN
Start: 2015-09-05 — End: 2015-09-05
  Administered 2015-09-05: 180 mg via ORAL

## 2015-09-05 MED ORDER — ATORVASTATIN CALCIUM 80 MG PO TABS: 80.0000 mg | ORAL_TABLET | Freq: Every day | ORAL | Status: DC

## 2015-09-05 MED ORDER — INSULIN ASPART 100 UNIT/ML ~~LOC~~ SOLN: 0.0000 [IU] | Freq: Three times a day (TID) | SUBCUTANEOUS | Status: DC

## 2015-09-05 MED ORDER — DIAZEPAM 5 MG PO TABS: 5.0000 mg | ORAL_TABLET | ORAL | Status: DC | PRN

## 2015-09-05 MED ORDER — BIVALIRUDIN BOLUS VIA INFUSION - CUPID
INTRAVENOUS | Status: DC | PRN
  Administered 2015-09-05: 77.775 mg via INTRAVENOUS

## 2015-09-05 MED ORDER — HEPARIN (PORCINE) IN NACL 2-0.9 UNIT/ML-% IJ SOLN
INTRAMUSCULAR | Status: AC
  Filled 2015-09-05: qty 500

## 2015-09-05 MED ORDER — HEPARIN (PORCINE) IN NACL 2-0.9 UNIT/ML-% IJ SOLN
INTRAMUSCULAR | Status: AC
  Filled 2015-09-05: qty 1000

## 2015-09-05 MED ORDER — LIDOCAINE HCL (PF) 1 % IJ SOLN
INTRAMUSCULAR | Status: DC | PRN
  Administered 2015-09-05: 3 mL

## 2015-09-05 MED ORDER — ANGIOPLASTY BOOK
Freq: Once | Status: AC
  Administered 2015-09-05: 22:00:00
  Filled 2015-09-05: qty 1

## 2015-09-05 SURGICAL SUPPLY — 25 items
BALLN EMERGE MR 2.5X15 (BALLOONS) ×2
BALLN ~~LOC~~ EMERGE MR 4.0X15 (BALLOONS) ×2
BALLN ~~LOC~~ EMERGE MR 4.0X8 (BALLOONS) ×2
BALLOON EMERGE MR 2.5X15 (BALLOONS) ×1 IMPLANT
BALLOON ~~LOC~~ EMERGE MR 4.0X15 (BALLOONS) ×1 IMPLANT
BALLOON ~~LOC~~ EMERGE MR 4.0X8 (BALLOONS) ×1 IMPLANT
CATH INFINITI 5 FR JL3.5 (CATHETERS) ×2 IMPLANT
CATH INFINITI 5FR JL4 (CATHETERS) ×2 IMPLANT
CATH LAUNCHER 5F RADL (CATHETERS) ×1 IMPLANT
CATH OPTITORQUE TIG 4.0 5F (CATHETERS) ×2 IMPLANT
CATH VISTA GUIDE 6FR XBLAD4 (CATHETERS) ×2 IMPLANT
CATHETER LAUNCHER 5F RADL (CATHETERS) ×2
GLIDESHEATH SLEND SS 6F .021 (SHEATH) ×2 IMPLANT
GUIDE CATH RUNWAY 6FR AL 1 (CATHETERS) ×2 IMPLANT
GUIDE CATH RUNWAY 6FR AL2 (CATHETERS) ×2 IMPLANT
GUIDE CATH RUNWAY 6FR CLS3.5 (CATHETERS) ×2 IMPLANT
KIT ENCORE 26 ADVANTAGE (KITS) ×2 IMPLANT
KIT HEART LEFT (KITS) ×2 IMPLANT
PACK CARDIAC CATHETERIZATION (CUSTOM PROCEDURE TRAY) ×2 IMPLANT
STENT SYNERGY DES 3.5X28 (Permanent Stent) ×2 IMPLANT
TRANSDUCER W/STOPCOCK (MISCELLANEOUS) ×2 IMPLANT
TUBING CIL FLEX 10 FLL-RA (TUBING) ×2 IMPLANT
TUBING CONTRAST HIGH PRESS 20 (MISCELLANEOUS) ×2 IMPLANT
WIRE PT2 MS 185 (WIRE) ×2 IMPLANT
WIRE SAFE-T 1.5MM-J .035X260CM (WIRE) ×2 IMPLANT

## 2015-09-05 NOTE — Progress Notes (Signed)
ANTICOAGULATION CONSULT NOTE - Follow Up Consult  Pharmacy Consult for Heparin Indication: NSTEMI  No Known Allergies  Patient Measurements: Height: '6\' 3"'$  (190.5 cm) Weight: 228 lb 11.2 oz (103.738 kg) IBW/kg (Calculated) : 84.5 Heparin Dosing Weight: 103 kg  Vital Signs: Temp: 98 F (36.7 C) (04/11 0801) Temp Source: Oral (04/11 0801) BP: 140/65 mmHg (04/11 0801) Pulse Rate: 80 (04/11 0801)  Labs:  Recent Labs  09/04/15 1821 09/04/15 2233 09/05/15 0229 09/05/15 0710 09/05/15 1022  HGB 13.8  --  12.7*  --   --   HCT 40.2  --  38.9*  --   --   PLT 266  --  237  --   --   LABPROT  --  14.7 15.3*  --   --   INR  --  1.14 1.19  --   --   HEPARINUNFRC  --   --  0.32  --  0.42  CREATININE 0.87  --  0.89  --   --   TROPONINI  --   --   --  1.32*  --     Estimated Creatinine Clearance: 113.7 mL/min (by C-G formula based on Cr of 0.89).   Medications:  Scheduled:  . aspirin  324 mg Oral NOW   Or  . aspirin  300 mg Rectal NOW  . [START ON 09/06/2015] aspirin EC  81 mg Oral Daily  . atorvastatin  80 mg Oral q1800  . carvedilol  3.125 mg Oral BID WC  . gabapentin  300 mg Oral TID  . insulin aspart  0-15 Units Subcutaneous TID WC  . lisinopril  10 mg Oral Daily  . sodium chloride flush  3 mL Intravenous Q12H  . sodium chloride flush  3 mL Intravenous Q12H   Infusions:  . sodium chloride 1 mL/kg/hr (09/05/15 0803)  . heparin 1,250 Units/hr (09/04/15 1948)    Assessment: 60 yo M presented to 3-4d hx of CP >> NSTEMI.  Pt started on heparin infusion with plans for cardiac cath this afternoon.  Heparin level is therapeutic on 1250 units/hr  Goal of Therapy:  Heparin level 0.3-0.7 units/ml Monitor platelets by anticoagulation protocol: Yes   Plan:  Continue heparin infusion at 1250 units/hr Check anti-Xa level daily while on heparin Continue to monitor H&H and platelets Follow-up after cath  [facility], Pharm.D., BCPS Clinical Pharmacist Pager  [phone] 09/05/2015 11:36 AM

## 2015-09-05 NOTE — Progress Notes (Signed)
Hospital Problem List     Active Problems:   NSTEMI (non-ST elevated myocardial infarction) [facility])    Patient Profile:   Primary Cardiologist: Dr. Angelena Form  60 y.o. male w/ PMH of PAD (s/p bilateral LE stenting (2003)), HLD, HTN, DMT2, previous tobacco abuse and no previous cardiac disease who presented to [facility] on 09/04/2015 for evaluation of CP. Found to have an NSTEMI w/ troponin of 1.01.  Subjective   Denies any repeat episodes of chest pain overnight. Respiratory status is at baseline. Has been NPO since midnight for cardiac cath today.  Inpatient Medications    . aspirin  324 mg Oral NOW   Or  . aspirin  300 mg Rectal NOW  . [START ON 09/06/2015] aspirin EC  81 mg Oral Daily  . carvedilol  3.125 mg Oral BID WC  . gabapentin  300 mg Oral TID  . lisinopril  10 mg Oral Daily  . simvastatin  40 mg Oral q1800  . sodium chloride flush  3 mL Intravenous Q12H  . sodium chloride flush  3 mL Intravenous Q12H    Vital Signs    Filed Vitals:   09/04/15 1930 09/04/15 2157 09/05/15 0006 09/05/15 0500  BP: 165/80 142/73 151/68 138/65  Pulse: 79 84 81 80  Temp:  98.1 F (36.7 C) 97.7 F (36.5 C) 98.6 F (37 C)  TempSrc:  Oral Oral Oral  Resp: 17     Height:  '6\' 3"'$  (1.905 m)    Weight:  232 lb (105.235 kg)  228 lb 11.2 oz (103.738 kg)  SpO2: 99% 96% 96% 96%   No intake or output data in the 24 hours ending 09/05/15 0641 Filed Weights   09/04/15 2157 09/05/15 0500  Weight: 232 lb (105.235 kg) 228 lb 11.2 oz (103.738 kg)    Physical Exam    General: Well developed, well nourished, male appearing in no acute distress. Head: Normocephalic, atraumatic.  Neck: Supple without bruits, JVD not elevated. Lungs:  Resp regular and unlabored, CTA without wheezing or rales. Heart: RRR, S1, S2, no S3, S4, or murmur; no rub. Abdomen: Soft, non-tender, non-distended with normoactive bowel sounds. No hepatomegaly. No rebound/guarding. No obvious abdominal masses. Extremities:  No clubbing, cyanosis, or edema. Distal pedal pulses are 2+ on the right, 1+ on the left. Neuro: Alert and oriented X 3. Moves all extremities spontaneously. Psych: Normal affect.  Labs    CBC  Recent Labs  09/04/15 1821 09/05/15 0229  WBC 7.8 9.4  HGB 13.8 12.7*  HCT 40.2 38.9*  MCV 89.5 89.6  PLT 266 481   Basic Metabolic Panel  Recent Labs  09/04/15 1821 09/05/15 0229  NA 141 140  K 4.0 3.6  CL 104 104  CO2 27 25  GLUCOSE 212* 192*  BUN 10 11  CREATININE 0.87 0.89  CALCIUM 10.1 9.4   Liver Function Tests  Recent Labs  09/05/15 0229  AST 25  ALT 24  ALKPHOS 50  BILITOT 0.6  PROT 6.9  ALBUMIN 3.8   Fasting Lipid Panel  Recent Labs  09/05/15 0229  CHOL 158  HDL 33*  LDLCALC 90  TRIG 174*  CHOLHDL 4.8   Thyroid Function Tests  Recent Labs  09/04/15 2233  TSH 1.083    Telemetry    1st degree AV Block, HR in 70's - 80's.   ECG    NSR, HR 79 with 1st degree AV block. TWI present in V2 -V5.    Cardiac Studies and  Radiology    Dg Chest 2 View: 09/04/2015  CLINICAL DATA:  Chest pain radiating into left arm for 4 days. Shortness of breath. EXAM: CHEST  2 VIEW COMPARISON:  None. FINDINGS: Lungs are clear. Heart size and pulmonary vascularity are normal. No adenopathy. No bone lesions. No pneumothorax. IMPRESSION: No edema or consolidation. Electronically Signed   By: Lowella Grip III M.D.   On: 09/04/2015 18:57    Echocardiogram: Pending  Assessment & Plan    1. NSTEMI - presented on 09/04/2015 for evaluation of chest pain which had initially started 3 days prior and associated with dyspnea and diaphoresis. - initial troponin elevated to 1.01. Cyclic values were not obtained. Will recheck with AM labs. - EKG shows T-wave inversions in anterolateral leads. - started on IV heparin. Added to the cath schedule for today. Tentatively planned for 1200 with Dr. Claiborne Billings. - continue ASA, statin, BB, and ACE-I.  2. HTN - continue PTA Lisinopril  '10mg'$  daily and recently started low-dose Coreg 3.'125mg'$  BID.  3. Type 2 DM - Metformin held for heart cath.  - orders added for sliding scale insulin.  4. PAD - s/p stenting in 2003 - continue ASA and statin   Arna Medici , [place] 6:41 AM 09/05/2015 Pager: [phone]

## 2015-09-05 NOTE — Progress Notes (Signed)
ANTICOAGULATION CONSULT NOTE - Follow Up Consult  Pharmacy Consult for heparin Indication: NSTEMI  Labs:  Recent Labs  09/04/15 1821 09/04/15 2233 09/05/15 0229  HGB 13.8  --  12.7*  HCT 40.2  --  38.9*  PLT 266  --  237  LABPROT  --  14.7 15.3*  INR  --  1.14 1.19  HEPARINUNFRC  --   --  0.32  CREATININE 0.87  --   --     Assessment/Plan:  60yo male therapeutic on heparin with initial dosing for NSTEMI. Will continue gtt at current rate and confirm stable with additional level.   Wynona Neat, PharmD, BCPS  09/05/2015,5:02 AM

## 2015-09-05 NOTE — H&P (View-Only) (Signed)
Hospital Problem List     Active Problems:   NSTEMI (non-ST elevated myocardial infarction) [facility])    Patient Profile:   Primary Cardiologist: Dr. Angelena Form  60 y.o. male w/ PMH of PAD (s/p bilateral LE stenting (2003)), HLD, HTN, DMT2, previous tobacco abuse and no previous cardiac disease who presented to [facility] on 09/04/2015 for evaluation of CP. Found to have an NSTEMI w/ troponin of 1.01.  Subjective   Denies any repeat episodes of chest pain overnight. Respiratory status is at baseline. Has been NPO since midnight for cardiac cath today.  Inpatient Medications    . aspirin  324 mg Oral NOW   Or  . aspirin  300 mg Rectal NOW  . [START ON 09/06/2015] aspirin EC  81 mg Oral Daily  . carvedilol  3.125 mg Oral BID WC  . gabapentin  300 mg Oral TID  . lisinopril  10 mg Oral Daily  . simvastatin  40 mg Oral q1800  . sodium chloride flush  3 mL Intravenous Q12H  . sodium chloride flush  3 mL Intravenous Q12H    Vital Signs    Filed Vitals:   09/04/15 1930 09/04/15 2157 09/05/15 0006 09/05/15 0500  BP: 165/80 142/73 151/68 138/65  Pulse: 79 84 81 80  Temp:  98.1 F (36.7 C) 97.7 F (36.5 C) 98.6 F (37 C)  TempSrc:  Oral Oral Oral  Resp: 17     Height:  '6\' 3"'$  (1.905 m)    Weight:  232 lb (105.235 kg)  228 lb 11.2 oz (103.738 kg)  SpO2: 99% 96% 96% 96%   No intake or output data in the 24 hours ending 09/05/15 0641 Filed Weights   09/04/15 2157 09/05/15 0500  Weight: 232 lb (105.235 kg) 228 lb 11.2 oz (103.738 kg)    Physical Exam    General: Well developed, well nourished, male appearing in no acute distress. Head: Normocephalic, atraumatic.  Neck: Supple without bruits, JVD not elevated. Lungs:  Resp regular and unlabored, CTA without wheezing or rales. Heart: RRR, S1, S2, no S3, S4, or murmur; no rub. Abdomen: Soft, non-tender, non-distended with normoactive bowel sounds. No hepatomegaly. No rebound/guarding. No obvious abdominal masses. Extremities:  No clubbing, cyanosis, or edema. Distal pedal pulses are 2+ on the right, 1+ on the left. Neuro: Alert and oriented X 3. Moves all extremities spontaneously. Psych: Normal affect.  Labs    CBC  Recent Labs  09/04/15 1821 09/05/15 0229  WBC 7.8 9.4  HGB 13.8 12.7*  HCT 40.2 38.9*  MCV 89.5 89.6  PLT 266 979   Basic Metabolic Panel  Recent Labs  09/04/15 1821 09/05/15 0229  NA 141 140  K 4.0 3.6  CL 104 104  CO2 27 25  GLUCOSE 212* 192*  BUN 10 11  CREATININE 0.87 0.89  CALCIUM 10.1 9.4   Liver Function Tests  Recent Labs  09/05/15 0229  AST 25  ALT 24  ALKPHOS 50  BILITOT 0.6  PROT 6.9  ALBUMIN 3.8   Fasting Lipid Panel  Recent Labs  09/05/15 0229  CHOL 158  HDL 33*  LDLCALC 90  TRIG 174*  CHOLHDL 4.8   Thyroid Function Tests  Recent Labs  09/04/15 2233  TSH 1.083    Telemetry    1st degree AV Block, HR in 70's - 80's.   ECG    NSR, HR 79 with 1st degree AV block. TWI present in V2 -V5.    Cardiac Studies and  Radiology    Dg Chest 2 View: 09/04/2015  CLINICAL DATA:  Chest pain radiating into left arm for 4 days. Shortness of breath. EXAM: CHEST  2 VIEW COMPARISON:  None. FINDINGS: Lungs are clear. Heart size and pulmonary vascularity are normal. No adenopathy. No bone lesions. No pneumothorax. IMPRESSION: No edema or consolidation. Electronically Signed   By: Lowella Grip III M.D.   On: 09/04/2015 18:57    Echocardiogram: Pending  Assessment & Plan    1. NSTEMI - presented on 09/04/2015 for evaluation of chest pain which had initially started 3 days prior and associated with dyspnea and diaphoresis. - initial troponin elevated to 1.01. Cyclic values were not obtained. Will recheck with AM labs. - EKG shows T-wave inversions in anterolateral leads. - started on IV heparin. Added to the cath schedule for today. Tentatively planned for 1200 with Dr. Claiborne Billings. - continue ASA, statin, BB, and ACE-I.  2. HTN - continue PTA Lisinopril  '10mg'$  daily and recently started low-dose Coreg 3.'125mg'$  BID.  3. Type 2 DM - Metformin held for heart cath.  - orders added for sliding scale insulin.  4. PAD - s/p stenting in 2003 - continue ASA and statin   Arna Medici , [place] 6:41 AM 09/05/2015 Pager: [phone]

## 2015-09-05 NOTE — Progress Notes (Signed)
TR BAND REMOVAL  LOCATION:    right radial  DEFLATED PER PROTOCOL:    Yes.    TIME BAND OFF / DRESSING APPLIED:    2300   SITE UPON ARRIVAL:    Level 0  SITE AFTER BAND REMOVAL:    Level 0  CIRCULATION SENSATION AND MOVEMENT:    Within Normal Limits   Yes.    COMMENTS:   Started deflation 2 hours s/p d/c angiomax;  2100. Pt tolerated well.

## 2015-09-05 NOTE — Interval H&P Note (Signed)
Cath Lab Visit (complete for each Cath Lab visit)  Clinical Evaluation Leading to the Procedure:   ACS: Yes.    Non-ACS:    Anginal Classification: CCS IV  Anti-ischemic medical therapy: Maximal Therapy (2 or more classes of medications)  Non-Invasive Test Results: No non-invasive testing performed  Prior CABG: No previous CABG      History and Physical Interval Note:  09/05/2015 3:17 PM  Kirk [name]  has presented today for surgery, with the diagnosis of NSTEMI  The various methods of treatment have been discussed with the patient and family. After consideration of risks, benefits and other options for treatment, the patient has consented to  Procedure(s): Left Heart Cath and Coronary Angiography (N/A) as a surgical intervention .  The patient's history has been reviewed, patient examined, no change in status, stable for surgery.  I have reviewed the patient's chart and labs.  Questions were answered to the patient's satisfaction.     Dewain Platz A

## 2015-09-06 ENCOUNTER — Encounter (HOSPITAL_COMMUNITY): Payer: Self-pay | Admitting: Cardiovascular Disease

## 2015-09-06 ENCOUNTER — Inpatient Hospital Stay (HOSPITAL_COMMUNITY): Payer: MEDICAID

## 2015-09-06 DIAGNOSIS — I10 Essential (primary) hypertension: Secondary | ICD-10-CM

## 2015-09-06 DIAGNOSIS — I214 Non-ST elevation (NSTEMI) myocardial infarction: Secondary | ICD-10-CM

## 2015-09-06 LAB — BASIC METABOLIC PANEL
Anion gap: 12 (ref 5–15)
BUN: 8 mg/dL (ref 6–20)
CALCIUM: 9.1 mg/dL (ref 8.9–10.3)
CO2: 21 mmol/L — AB (ref 22–32)
CREATININE: 0.83 mg/dL (ref 0.61–1.24)
Chloride: 108 mmol/L (ref 101–111)
GFR calc non Af Amer: >60 mL/min (ref >60)
Glucose, Bld: 118 mg/dL — ABNORMAL HIGH (ref 65–99)
Potassium: 3.7 mmol/L (ref 3.5–5.1)
SODIUM: 141 mmol/L (ref 135–145)

## 2015-09-06 LAB — GLUCOSE, CAPILLARY
Glucose-Capillary: 123 mg/dL — ABNORMAL HIGH (ref 65–99)
Glucose-Capillary: 115 mg/dL — ABNORMAL HIGH (ref 65–99)

## 2015-09-06 LAB — CBC
HCT: 38.5 % — ABNORMAL LOW (ref 39.0–52.0)
Hemoglobin: 12.5 g/dL — ABNORMAL LOW (ref 13.0–17.0)
MCH: 28.9 pg (ref 26.0–34.0)
MCHC: 32.5 g/dL (ref 30.0–36.0)
MCV: 89.1 fL (ref 78.0–100.0)
PLATELETS: 224 10*3/uL (ref 150–400)
RBC: 4.32 MIL/uL (ref 4.22–5.81)
RDW: 12.8 % (ref 11.5–15.5)
WBC: 7.4 10*3/uL (ref 4.0–10.5)

## 2015-09-06 LAB — ECHOCARDIOGRAM COMPLETE
HEIGHTINCHES: 75 in
WEIGHTICAEL: 3668.45 [oz_av]

## 2015-09-06 LAB — HEMOGLOBIN A1C
HEMOGLOBIN A1C: 7.3 % — AB (ref 4.8–5.6)
Mean Plasma Glucose: 163 mg/dL

## 2015-09-06 MED ORDER — ATORVASTATIN CALCIUM 80 MG PO TABS: 80.0000 mg | ORAL_TABLET | Freq: Every day | ORAL | Status: DC

## 2015-09-06 MED ORDER — NITROGLYCERIN 0.4 MG SL SUBL: 0.4000 mg | SUBLINGUAL_TABLET | SUBLINGUAL | Status: DC | PRN

## 2015-09-06 MED ORDER — CARVEDILOL 3.125 MG PO TABS: 3.1250 mg | ORAL_TABLET | Freq: Two times a day (BID) | ORAL | Status: DC

## 2015-09-06 MED ORDER — LIVING WELL WITH DIABETES BOOK
Freq: Once | Status: AC
  Administered 2015-09-06: 11:00:00
  Filled 2015-09-06: qty 1

## 2015-09-06 MED ORDER — TICAGRELOR 90 MG PO TABS: 90.0000 mg | ORAL_TABLET | Freq: Two times a day (BID) | ORAL | Status: DC

## 2015-09-06 NOTE — Progress Notes (Signed)
Hospital Problem List     Principal Problem:   NSTEMI (non-ST elevated myocardial infarction) ([place]) Active Problems:   HTN (hypertension)   Diabetes mellitus with peripheral vascular disease [facility])    Patient Profile:   Primary Cardiologist: Dr. Angelena Form  60 y.o. male w/ PMH of PAD (s/p bilateral LE stenting (2003)), HLD, HTN, DMT2, previous tobacco abuse and no previous cardiac disease who presented to [facility] on 09/04/2015 for evaluation of CP. Found to have an NSTEMI w/ peak troponin of 1.32.  Subjective   Denies any chest pain or shortness of breath. Ambulated with cardiac rehab without difficulty.  Inpatient Medications    . aspirin EC  81 mg Oral Daily  . atorvastatin  80 mg Oral q1800  . carvedilol  3.125 mg Oral BID WC  . gabapentin  300 mg Oral TID  . insulin aspart  0-15 Units Subcutaneous TID WC  . lisinopril  10 mg Oral Daily  . sodium chloride flush  3 mL Intravenous Q12H  . sodium chloride flush  3 mL Intravenous Q12H  . ticagrelor  90 mg Oral BID    Vital Signs    Filed Vitals:   09/05/15 2230 09/05/15 2300 09/05/15 2330 09/06/15 0424  BP: 134/59 143/54 133/63 138/49  Pulse: 81 76 74 73  Temp:    98.4 F (36.9 C)  TempSrc:    Oral  Resp: '16 17 15 16  '$ Height:      Weight:    229 lb 4.5 oz (104 kg)  SpO2: 98% 96% 97% 96%    Intake/Output Summary (Last 24 hours) at 09/06/15 0745 Last data filed at 09/06/15 0655  Gross per 24 hour  Intake 283.75 ml  Output   1625 ml  Net -1341.25 ml   Filed Weights   09/04/15 2157 09/05/15 0500 09/06/15 0424  Weight: 232 lb (105.235 kg) 228 lb 11.2 oz (103.738 kg) 229 lb 4.5 oz (104 kg)    Physical Exam    General: Well developed, well nourished, male appearing in no acute distress. Head: Normocephalic, atraumatic. Neck: Supple without bruits, JVD not elevated. Lungs: Resp regular and unlabored, CTA without wheezing or rales. Heart: RRR, S1, S2, no S3, S4, or murmur; no rub. Abdomen: Soft,  non-tender, non-distended with normoactive bowel sounds. No hepatomegaly. No rebound/guarding. No obvious abdominal masses. Extremities: No clubbing, cyanosis, or edema. Distal pedal pulses are 2+ on the right, 1+ on the left. Cath site without ecchymosis or evidence of a hematoma. Neuro: Alert and oriented X 3. Moves all extremities spontaneously. Psych: Normal affect.  Labs    CBC  Recent Labs  09/05/15 0229 09/06/15 0335  WBC 9.4 7.4  HGB 12.7* 12.5*  HCT 38.9* 38.5*  MCV 89.6 89.1  PLT 237 361   Basic Metabolic Panel  Recent Labs  09/05/15 0229 09/06/15 0335  NA 140 141  K 3.6 3.7  CL 104 108  CO2 25 21*  GLUCOSE 192* 118*  BUN 11 8  CREATININE 0.89 0.83  CALCIUM 9.4 9.1   Liver Function Tests  Recent Labs  09/05/15 0229  AST 25  ALT 24  ALKPHOS 50  BILITOT 0.6  PROT 6.9  ALBUMIN 3.8   No results for input(s): LIPASE, AMYLASE in the last 72 hours. Cardiac Enzymes  Recent Labs  09/05/15 0710 09/05/15 1905  TROPONINI 1.32* 1.05*   BNP Invalid input(s): POCBNP D-Dimer No results for input(s): DDIMER in the last 72 hours. Hemoglobin A1C  Recent Labs  09/04/15 2233  HGBA1C 7.3*   Fasting Lipid Panel  Recent Labs  09/05/15 0229  CHOL 158  HDL 33*  LDLCALC 90  TRIG 174*  CHOLHDL 4.8   Thyroid Function Tests  Recent Labs  09/04/15 2233  TSH 1.083    Telemetry    NSR, HR in 70's 80's.  ECG    1st degree AV block, HR 73, TWI in V5 and V6.   Cardiac Studies and Radiology    Dg Chest 2 View: 09/04/2015  CLINICAL DATA:  Chest pain radiating into left arm for 4 days. Shortness of breath. EXAM: CHEST  2 VIEW COMPARISON:  None. FINDINGS: Lungs are clear. Heart size and pulmonary vascularity are normal. No adenopathy. No bone lesions. No pneumothorax. IMPRESSION: No edema or consolidation. Electronically Signed   By: Lowella Grip III M.D.   On: 09/04/2015 18:57   Cardiac Catheterization: 09/05/2015  Ost LM to LM lesion, 25%  stenosed.  Mid Cx lesion, 30% stenosed.  Dist Cx lesion, 30% stenosed.  Prox RCA lesion, 40% stenosed.  Mid RCA lesion, 55% stenosed.  Mid LAD lesion, 95% stenosed. Post intervention, there is a 0% residual stenosis.  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  LVEDP 9 mm Hg.  Successful PCI to the LAD with ultimate insertion of a 3.528 mm Synergy DES stent postdilated to 4.11 mm at the site of stenosis with initial residual waist 4.03 mm at other sites with the 95% stenoses being reduced to 0% and brisk TIMI-3 flow.  Recommendation: The patient will continue with dual antiplatelet therapy ideally indefinitely. A 2-D echo Doppler study will be performed to assess LV function. Patient will be treated with high potency statin, ACE inhibitor, beta blocker therapy and possibly nitrate therapy.  Assessment & Plan    1. NSTEMI - presented on 09/04/2015 for evaluation of chest pain which had initially started 3 days prior and associated with dyspnea and diaphoresis. - initial troponin elevated to 6.81 with cyclic values at 1.57 and 1.05.  - EKG shows T-wave inversions in anterolateral leads. - cardiac cath showed multivessel CAD w/ 95% stenosis mid-LAD, 55% mid-RCA, and 40% prox-RCA. PCI performed w/ Synergy DES to LAD.  - echocardiogram is pending.  - continue ASA, Brilinta, statin, BB, and ACE-I. Case Management to see in regards to Brilinta. He does not have insurance coverage at this time so unless he is able to successfully enroll in the patient assistance program, he will need to be switched to Plavix after 30 days. - will arrange for 7-10 day TCM appointment prior to discharge.  2. HTN - continue PTA Lisinopril  '10mg'$  daily and recently started low-dose Coreg 3.'125mg'$  BID.  3. Type 2 DM - hold Metformin for 48 hours following cath. Will resume on 09/08/2015.  4. PAD - s/p stenting in 2003 - continue ASA and statin   5. HLD - switched to Atorvastatin '80mg'$  daily.  Signed, Erma Heritage , PA-C 7:45 AM 09/06/2015 Pager: [phone]

## 2015-09-06 NOTE — Care Management Note (Signed)
Case Management Note  Patient Details  Name: Kirk [name] MRN: [id number] Date of Birth: Nov 08, 1955  Subjective/Objective:   Patient is from home with spouse, pta indep, Will be on Brilinta, NCM gave patient 30 day savings card, and gave patient assistance app to RN to have MD fill out prescriber info.  Patient has hospital follow up apt at the [facility] ( over flow for community health and wellness clinic) on 4/27 at 11 am.  NCM informed patient to go to [facility] clinic when dc to get 30 days free , script faxed to their clinic.  Brochures for [facility] clinic and [facility] given to patient.                  Action/Plan:   Expected Discharge Date:                  Expected Discharge Plan:  Home/Self Care  In-House Referral:     Discharge planning Services  CM Consult, Medication Assistance, Follow-up appt scheduled  Post Acute Care Choice:    Choice offered to:     DME Arranged:    DME Agency:     HH Arranged:    HH Agency:     Status of Service:  Completed, signed off  Medicare Important Message Given:    Date Medicare IM Given:    Medicare IM give by:    Date Additional Medicare IM Given:    Additional Medicare Important Message give by:     If discussed at [place] of Stay Meetings, dates discussed:    Additional Comments:  Zenon Mayo, RN 09/06/2015, 10:40 AM

## 2015-09-06 NOTE — Progress Notes (Signed)
CARDIAC REHAB PHASE I   PRE:  Rate/Rhythm: 88 SR    BP: sitting 145/63    SaO2:   MODE:  Ambulation: 440 ft   POST:  Rate/Rhythm: 105 ST    BP: sitting 172/59     SaO2:   Tolerated well, feels good. Ed completed with good reception. Understands importance of Brilinta although does not have the card yet. Interested in [place] although will need to apply for financial assistance. Will send referral to [place], ACSM 09/06/2015 9:27 AM

## 2015-09-06 NOTE — Progress Notes (Signed)
  Echocardiogram 2D Echocardiogram has been performed.  Kirk [name] 09/06/2015, 8:32 AM

## 2015-09-06 NOTE — Discharge Summary (Signed)
Discharge Summary    Patient ID: Kirk [name],  MRN: [id number], DOB/AGE: 09/09/1955 60 y.o.  Admit date: 09/04/2015 Discharge date: 09/06/2015  Primary Care Provider: Ashok Norris Primary Cardiologist: Dr. Angelena Form  Discharge Diagnoses    Principal Problem:   NSTEMI (non-ST elevated myocardial infarction) [facility]) Active Problems:   HTN (hypertension)   Diabetes mellitus with peripheral vascular disease ([place])   History of Present Illness     Kirk [name] is a 60 y.o. male with past medical history of PAD (s/p bilateral LE stenting (2003)), HLD, HTN, DMT2, previous tobacco abuse and no previous cardiac disease who presented to [facility] ED on 09/04/2015 for evaluation of chest pain.   He reported having episodes of chest pain for the past 3 days, which he initially thought was indigestion but was associated with diaphoresis, dyspnea, and pain radiating down his left arm. His PCP and Urgent Care providers encouraged him to come to the ED for further evaluation.   While in the ED, his EKG showed new T-wave inversions in the anterolateral leads and initial troponin was elevated to 1.01. Therefore, he was started on Heparin with anticipation of a cardiac catheterization the following day.  Hospital Course     Consultants: None  Overnight, he denied any repeat episodes of chest pain or dyspnea. The risks and benefits of a cardiac catheterization were discussed with the patient and he agreed to proceed.  His cath showed multivessel CAD with 95% stenosis in the mid-LAD, 55% mid-RCA, and 40% prox-RCA. Successful PCI was performed with a Synergy DES to the LAD. He tolerated the procedure well and no complications were noted. The full report is included below. He was started on DAPT with ASA and Brilinta.  The following morning, he denied any repeat symptoms. He ambulated 440 ft with cardiac rehab without any symptoms. Labs and vitals were thoroughly reviewed and showed no  significant abnormalities. He was last examined by Dr. Radford Pax and deemed stable for discharge. He will continue on ASA, Brilinta, high-dose statin, BB, and ACE-I therapy. He was given a 30-day copay card to use with Brilinta and the provider portion of the financial assistance form was completed prior to discharge. He was instructed to resume Metformin on 09/08/2015.  A 7-10 day TCM Cardiology follow-up appointment has been arranged.  _____________  Discharge Vitals Blood pressure 172/59, pulse 81, temperature 98 F (36.7 C), temperature source Oral, resp. rate 15, height '6\' 3"'$  (1.905 m), weight 229 lb 4.5 oz (104 kg), SpO2 96 %.  Filed Weights   09/04/15 2157 09/05/15 0500 09/06/15 0424  Weight: 232 lb (105.235 kg) 228 lb 11.2 oz (103.738 kg) 229 lb 4.5 oz (104 kg)    Labs & Radiologic Studies     CBC  Recent Labs  09/05/15 0229 09/06/15 0335  WBC 9.4 7.4  HGB 12.7* 12.5*  HCT 38.9* 38.5*  MCV 89.6 89.1  PLT 237 353   Basic Metabolic Panel  Recent Labs  09/05/15 0229 09/06/15 0335  NA 140 141  K 3.6 3.7  CL 104 108  CO2 25 21*  GLUCOSE 192* 118*  BUN 11 8  CREATININE 0.89 0.83  CALCIUM 9.4 9.1   Liver Function Tests  Recent Labs  09/05/15 0229  AST 25  ALT 24  ALKPHOS 50  BILITOT 0.6  PROT 6.9  ALBUMIN 3.8   No results for input(s): LIPASE, AMYLASE in the last 72 hours. Cardiac Enzymes  Recent Labs  09/05/15 0710 09/05/15  1905  TROPONINI 1.32* 1.05*   BNP Invalid input(s): POCBNP D-Dimer No results for input(s): DDIMER in the last 72 hours. Hemoglobin A1C  Recent Labs  09/04/15 2233  HGBA1C 7.3*   Fasting Lipid Panel  Recent Labs  09/05/15 0229  CHOL 158  HDL 33*  LDLCALC 90  TRIG 174*  CHOLHDL 4.8   Thyroid Function Tests  Recent Labs  09/04/15 2233  TSH 1.083   Dg Chest 2 View: 09/04/2015  CLINICAL DATA:  Chest pain radiating into left arm for 4 days. Shortness of breath. EXAM: CHEST  2 VIEW COMPARISON:  None. FINDINGS:  Lungs are clear. Heart size and pulmonary vascularity are normal. No adenopathy. No bone lesions. No pneumothorax. IMPRESSION: No edema or consolidation. Electronically Signed   By: Lowella Grip III M.D.   On: 09/04/2015 18:57   Diagnostic Studies/Procedures     Cardiac Catheterization: 09/05/2015  Ost LM to LM lesion, 25% stenosed.  Mid Cx lesion, 30% stenosed.  Dist Cx lesion, 30% stenosed.  Prox RCA lesion, 40% stenosed.  Mid RCA lesion, 55% stenosed.  Mid LAD lesion, 95% stenosed. Post intervention, there is a 0% residual stenosis.  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  LVEDP 9 mm Hg.  Successful PCI to the LAD with ultimate insertion of a 3.528 mm Synergy DES stent postdilated to 4.11 mm at the site of stenosis with initial residual waist 4.03 mm at other sites with the 95% stenoses being reduced to 0% and brisk TIMI-3 flow.  Recommendation: The patient will continue with dual antiplatelet therapy ideally indefinitely. A 2-D echo Doppler study will be performed to assess LV function. Patient will be treated with high potency statin, ACE inhibitor, beta blocker therapy and possibly nitrate therapy.  Echocardiogram: 09/06/2015 Study Conclusions - Left ventricle: The cavity size was normal. There was moderate  focal basal hypertrophy of the septum. Systolic function was  normal. The estimated ejection fraction was in the range of 60%  to 65%. Wall motion was normal; there were no regional wall  motion abnormalities. Doppler parameters are consistent with  abnormal left ventricular relaxation (grade 1 diastolic  dysfunction). The E/e&' ratio is between 8-15, suggesting  indeterminate LV  filling pressure. - Aortic valve: Trileaflet. Cusp separation was normal. There was  no stenosis. Subvalvular LVOT acceleration is noted. There was no  regurgitation. - Mitral valve: SAM. Mildly thickened leaflets . There was trivial  regurgitation. - Left atrium: The atrium was normal in size. - Inferior vena cava: The vessel was normal in size. The  respirophasic diameter changes were in the normal range (>= 50%),  consistent with normal central venous pressure.  Impressions: - LVEF 60-65%, moderate basal septal hypertrophy with SAM and LVOT  acceleration, normal wall motion, diastolic dysfunction with  indeterminate LV filling pressure, normal LA size, normal IVC.  Disposition   Pt is being discharged home today in good condition.  Follow-up Plans & Appointments    Follow-up Information    Follow up with [facility] On 09/21/2015.   Specialty:  Internal Medicine   Why:   11 am hospital follow up with [facility] information:   [place] [phone]  Follow up with Richardson Dopp, PA-C On 09/15/2015.   Specialties:  Physician Assistant, Radiology, Interventional Cardiology   Why:  Cardiology Hospital Follow-Up on 09/15/2015 at 12:10PM.   Contact information:   [address] [name] [address] [phone]      Discharge Instructions    Amb Referral to Cardiac Rehabilitation    Complete by:  As directed   Diagnosis:   NSTEMI PTCA Coronary Stents       Diet - low sodium heart healthy    Complete by:  As directed      Discharge instructions    Complete by:  As directed   PLEASE REMEMBER TO BRING ALL OF YOUR MEDICATIONS TO EACH OF YOUR FOLLOW-UP OFFICE VISITS.  PLEASE ATTEND ALL SCHEDULED FOLLOW-UP APPOINTMENTS.   Activity: Increase activity slowly as tolerated. You may shower, but no soaking baths (or swimming) for 1 week. No driving for 1 week. No lifting over 10  lbs for 2 weeks. No sexual activity for 2 weeks.   You May Return to Work: in 1 week (if applicable)  Wound Care: You may wash cath site gently with soap and water. Keep cath site clean and dry. If you notice pain, swelling, bleeding or pus at your cath site, please call [phone].     Increase activity slowly    Complete by:  As directed            Discharge Medications   Discharge Medication List as of 09/06/2015 10:08 AM    START taking these medications   Details  atorvastatin (LIPITOR) 80 MG tablet Take 1 tablet (80 mg total) by mouth daily at 6 PM., Starting 09/06/2015, Until Discontinued, Normal    carvedilol (COREG) 3.125 MG tablet Take 1 tablet (3.125 mg total) by mouth 2 (two) times daily with a meal., Starting 09/06/2015, Until Discontinued, Normal    nitroGLYCERIN (NITROSTAT) 0.4 MG SL tablet Place 1 tablet (0.4 mg total) under the tongue every 5 (five) minutes x 3 doses as needed for chest pain., Starting 09/06/2015, Until Discontinued, Normal      CONTINUE these medications which have CHANGED   Details  ticagrelor (BRILINTA) 90 MG TABS tablet Take 1 tablet (90 mg total) by mouth 2 (two) times daily., Starting 09/06/2015, Until Discontinued, Print      CONTINUE these medications which have NOT CHANGED   Details  aspirin 81 MG tablet Take 81 mg by mouth daily., Until Discontinued, Historical Med    gabapentin (NEURONTIN) 300 MG capsule Take 300 mg by mouth 3 (three) times daily., Until Discontinued, Historical Med    glimepiride (AMARYL) 4 MG tablet take 1 tablet by mouth once daily, Normal    HYDROcodone-acetaminophen (NORCO/VICODIN) 5-325 MG per tablet Take 1-2 tablets by mouth every 4 (four) hours as needed for moderate pain or severe pain., Starting 01/29/2014, Until Discontinued, Print    lisinopril (PRINIVIL,ZESTRIL) 10 MG tablet Take 1 tablet (10 mg total) by mouth daily., Starting 01/03/2015, Until Discontinued, Print    metFORMIN (GLUCOPHAGE) 1000 MG tablet  Take 1 tablet (1,000 mg total) by mouth 2 (two) times daily., Starting 01/03/2015, Until Discontinued, Print      STOP taking these medications     meloxicam (MOBIC) 15 MG tablet      simvastatin (ZOCOR) 40 MG tablet          Aspirin prescribed at discharge?  Yes High Intensity Statin Prescribed? (Lipitor 40-'80mg'$  or Crestor 20-'40mg'$ ): Yes Beta Blocker Prescribed? Yes For EF 45% or  less, Was ACEI/ARB Prescribed? Yes ADP Receptor Inhibitor Prescribed? (i.e. Plavix etc.-Includes Medically Managed Patients): Yes For EF <40%, Aldosterone Inhibitor Prescribed? No: N/A Was EF assessed during THIS hospitalization? Yes - Echo and Cardiac Catheterization Was Cardiac Rehab II ordered? (Included Medically managed Patients): Yes   Allergies No Known Allergies   Outstanding Labs/Studies   None  Duration of Discharge Encounter   Greater than 30 minutes including physician time.  Signed, Erma Heritage, PA-C 09/06/2015, 1:30 PM

## 2015-09-13 ENCOUNTER — Telehealth (HOSPITAL_COMMUNITY): Payer: Self-pay | Admitting: Cardiac Rehabilitation

## 2015-09-13 NOTE — Telephone Encounter (Signed)
pc to pt to discuss enrolling in cardiac rehab. Pt declined.  He has returned to work and is walking on his own.

## 2015-09-14 ENCOUNTER — Telehealth: Payer: Self-pay | Admitting: Cardiovascular Disease

## 2015-09-14 NOTE — Telephone Encounter (Signed)
New message     Patient calling  C/O Top lip swollen S/p cath done last Tuesday.    Patient is asking if any of his medication can cause him side effect.

## 2015-09-14 NOTE — Telephone Encounter (Signed)
Chart reviewed and pt is on lisinopril. Has been on since at least August 2016.  Reviewed with Angelena Form, PA and pt should stop lisinopril. Is seeing Richardson Dopp, [place] tomorrow. If symptoms worsen he should go to ED.   Pt reports he noticed swelling/numbess of upper lip this AM when he got up.  No other swelling. Does report mild shortness of breath at times.  This started during  his hospitalization and he was told it might be related to [place].  This has not worsened and is the same as when he was in the hospital. He reports shortness of breath that comes and goes and lasts for about 5-10 minutes when it happens. I told pt to stop lisinopril. He takes this at night. I instructed pt if swelling worsens or he develops increasing shortness of breath or any other symptoms he should go to ED.

## 2015-09-14 NOTE — Progress Notes (Signed)
Cardiology Office Note:    Date:  09/15/2015   ID:  [name], DOB February 19, 1956, MRN [id number]  PCP:  Ashok Norris, MD  Cardiologist:  Dr. Lauree Chandler   Electrophysiologist:  n/a  Referring MD: Ashok Norris, MD   Chief Complaint  Patient presents with  . Hospitalization Follow-up    s/p NSTEMI    History of Present Illness:     Kirk [name] is a 60 y.o. male with a hx of PAD s/p prior bilateral LE stenting in 2003, HL, HTN, DM2, prior smoking.    Admitted 4/10-4/12 with NSTEMI.  LHC demonstrated high grade proximal LAD stenosis treated with a DES.  He had mild to mod non-obs disease elsewhere.  Echo demonstrated normal LVF.  There was + SAM, basal septal hypertrophy and subvalvular LVOT acceleration noted.  Post PCI course was uneventful.    Returns for FU.  He had some recent lip swelling.  He spoke to our nurse and was told to hold his Lisinopril. He did not hold it b/c he felt the swelling was related to a sore on his gums.  He took some antibiotics that he had previously taken for this same problem and his swelling resolved. He denies chest pain.  He has been short of breath at times.  Denies significant DOE.  Denies orthopnea, PND, edema, syncope.  Denies any bleeding issues.    Past Medical History  Diagnosis Date  . HTN (hypertension)   . Hyperlipidemia   . PAD (peripheral artery disease) (HCC)     Stenting of bilateral iliacs in 2003.  [name] Refusal of blood transfusions as patient is Jehovah's Witness   . Type II diabetes mellitus ([place])   . Arthritis     "right knee" (09/04/2015)  . NSTEMI (non-ST elevated myocardial infarction) ([place]) 09/04/2015    a. cath 09/05/2015: 95% stenosis mid-LAD, 55% mid-RCA, and 40% prox-RCA. PCI performed w/ Synergy DES to LAD    Past Surgical History  Procedure Laterality Date  . Orif congenital hip dislocation Left ~ 1965    "put pins in it"  . Tonsillectomy  ~ 1963  . Peripheral vascular catheterization Bilateral  2003    "stenting"  . Cardiac catheterization N/A 09/05/2015    Procedure: Left Heart Cath and Coronary Angiography;  Surgeon: Troy Sine, MD;  Location: [facility];  Service: Cardiovascular;  Laterality: N/A;  . Cardiac catheterization N/A 09/05/2015    Procedure: Coronary Stent Intervention;  Surgeon: Troy Sine, MD;  Location: [facility];  Service: Cardiovascular;  Laterality: N/A;    Current Medications: Outpatient Prescriptions Prior to Visit  Medication Sig Dispense Refill  . aspirin 81 MG tablet Take 81 mg by mouth daily.    [name] atorvastatin (LIPITOR) 80 MG tablet Take 1 tablet (80 mg total) by mouth daily at 6 PM. 30 tablet 6  . gabapentin (NEURONTIN) 300 MG capsule Take 300 mg by mouth 3 (three) times daily.    [name] glimepiride (AMARYL) 4 MG tablet take 1 tablet by mouth once daily 90 tablet 1  . HYDROcodone-acetaminophen (NORCO/VICODIN) 5-325 MG per tablet Take 1-2 tablets by mouth every 4 (four) hours as needed for moderate pain or severe pain. 15 tablet 0  . lisinopril (PRINIVIL,ZESTRIL) 10 MG tablet Take 1 tablet (10 mg total) by mouth daily. 90 tablet 1  . metFORMIN (GLUCOPHAGE) 1000 MG tablet Take 1 tablet (1,000 mg total) by mouth 2 (two) times daily. 180 tablet 1  . nitroGLYCERIN (  NITROSTAT) 0.4 MG SL tablet Place 1 tablet (0.4 mg total) under the tongue every 5 (five) minutes x 3 doses as needed for chest pain. 25 tablet 2  . carvedilol (COREG) 3.125 MG tablet Take 1 tablet (3.125 mg total) by mouth 2 (two) times daily with a meal. 60 tablet 6  . ticagrelor (BRILINTA) 90 MG TABS tablet Take 1 tablet (90 mg total) by mouth 2 (two) times daily. 60 tablet 11   No facility-administered medications prior to visit.     Allergies:   Review of patient's allergies indicates no known allergies.   Social History   Social History  . Marital Status: Divorced    Spouse Name: N/A  . Number of Children: 1  . Years of Education: N/A   Occupational History  .  Furniture salesman    Social History Main Topics  . Smoking status: Former Smoker -- 2.00 packs/day for 35 years    Types: Cigarettes    Quit date: 02/19/2003  . Smokeless tobacco: Never Used  . Alcohol Use: 0.0 oz/week    0 Standard drinks or equivalent per week     Comment: 09/04/2015 "nothing since 1980s"  . Drug Use: Yes    Special: Marijuana     Comment: "smoked reef in the 1970s"  . Sexual Activity: Not Asked   Other Topics Concern  . None   Social History Narrative     Family History:  The patient's family history includes Cancer - Prostate in his father; Lymphoma in his mother. There is no history of CAD.   ROS:   Please see the history of present illness.    ROS All other systems reviewed and are negative.   Physical Exam:    VS:  BP 138/48 mmHg  Pulse 84  Ht '6\' 3"'$  (1.905 m)  Wt 226 lb 6.4 oz (102.694 kg)  BMI 28.30 kg/m2  SpO2 95%   GEN: Well nourished, well developed, in no acute distress HEENT: normal Neck: no JVD, no masses Cardiac: Normal S1/S2, RRR; no murmurs, rubs, or gallops, no edema;  right wrist without hematoma or mass    Respiratory:  clear to auscultation bilaterally; no wheezing, rhonchi or rales GI: soft, nontender, nondistended MS: no deformity or atrophy Skin: warm and dry Neuro: No focal deficits  Psych: Alert and oriented x 3, normal affect  Wt Readings from Last 3 Encounters:  09/15/15 226 lb 6.4 oz (102.694 kg)  09/06/15 229 lb 4.5 oz (104 kg)  06/08/15 245 lb 14.4 oz (111.54 kg)      Studies/Labs Reviewed:     EKG:  EKG is  ordered today.  The ekg ordered today demonstrates NSR, HR 82, normal axis, TWI 2, 3, aVF, V1-6, 1st degree AVB  Recent Labs: 09/04/2015: TSH 1.083 09/05/2015: ALT 24 09/06/2015: BUN 8; Creatinine, Ser 0.83; Hemoglobin 12.5*; Platelets 224; Potassium 3.7; Sodium 141   Recent Lipid Panel    Component Value Date/Time   CHOL 158 09/05/2015 0229   CHOL 164 06/08/2015 0840   TRIG 174* 09/05/2015 0229    HDL 33* 09/05/2015 0229   HDL 35* 06/08/2015 0840   CHOLHDL 4.8 09/05/2015 0229   CHOLHDL 4.7 06/08/2015 0840   VLDL 35 09/05/2015 0229   LDLCALC 90 09/05/2015 0229   LDLCALC 101* 06/08/2015 0840    Additional studies/ records that were reviewed today include:   Echo 09/06/15 Mod focal basal septal hypertrophy, EF 60-65%, no RWMA, Gr 1 DD, subvalvular LVOT accel noted, no AI, +  SAM  LHC 09/05/15 LM ost 25% LAD mid 95% LCx mid 30%, dist 30% RCA prox 40%, mid 55% PCI: 2.5 x 28 Synergy DES to LAD   ASSESSMENT:     1. Coronary artery disease involving native coronary artery of native heart without angina pectoris   2. Essential hypertension   3. Hyperlipidemia   4. LVH (left ventricular hypertrophy)   5. Lip swelling     PLAN:     In order of problems listed above:  1. CAD - s/p NSTEMI tx with DES to LAD.  EF was preserved by Echo.  He is doing well without angina.  We discussed the importance of dual antiplatelet therapy. He is having some dyspnea related to Brilinta.  He did not qualify for any assistance for his Brilinta either.  He may be able to get it at [facility] clinic.    -  Brilinta samples  -  Continue ASA, statin, beta-blocker, ACE inhibitor, Brilinta  -  If dyspnea continues > 30 days post PCI, he will call and I can change him to Plavix  -  He declines cardiac rehab.   2. HTN - Controlled.   3. HL - Continue statin.  Check Lipids and LFTs in 6 weeks.    4. LVH - Subvalvular LVOT acceleration noted as well as + SAM on recent echo.  Continue beta-blocker.  HR is in the 80s.  Will increase Carvedilol to 6.25 mg bid.   5. Lip swelling - This seems to be related to a sore on his gums.  It resolved while he continued his ACE inhibitor.     Medication Adjustments/Labs and Tests Ordered: Current medicines are reviewed at length with the patient today.  Concerns regarding medicines are outlined above.  Medication changes, Labs and Tests ordered today are outlined in the  Patient Instructions noted below. Patient Instructions  Medication Instructions:  Increase your Carvedilol (Coreg) to 6.25 mg Twice daily  -  A new prescription was sent to your pharmacy.  Labwork: In 6 weeks - Lipids, LFTs Testing/Procedures: None  Follow-Up: Dr. Lauree Chandler 12/18/15 @ 11:15 Any Other Special Instructions Will Be Listed Below (If Applicable). When you are finishing your 1st bottle of Brilinta, if you are still short of breath, call me.  I will change you to something else to take the place of Brilinta. Keep your appointment with Ms. Hollis on 4/27. Her office may be able to help with medications.  They have a pharmacy.   If you need a refill on your cardiac medications before your next appointment, please call your pharmacy.    Signed, Richardson Dopp, PA-C  09/15/2015 1:08 PM    [facility] [address], [place], [place]  [address] Phone: [phone]; Fax: [phone]

## 2015-09-15 ENCOUNTER — Ambulatory Visit (INDEPENDENT_AMBULATORY_CARE_PROVIDER_SITE_OTHER): Payer: Self-pay | Admitting: Physician Assistant

## 2015-09-15 ENCOUNTER — Encounter: Payer: Self-pay | Admitting: Physician Assistant

## 2015-09-15 VITALS — BP 138/48 | HR 84 | Ht 75.0 in | Wt 226.4 lb

## 2015-09-15 DIAGNOSIS — K130 Diseases of lips: Secondary | ICD-10-CM

## 2015-09-15 DIAGNOSIS — R220 Localized swelling, mass and lump, head: Secondary | ICD-10-CM

## 2015-09-15 DIAGNOSIS — E785 Hyperlipidemia, unspecified: Secondary | ICD-10-CM

## 2015-09-15 DIAGNOSIS — I10 Essential (primary) hypertension: Secondary | ICD-10-CM

## 2015-09-15 DIAGNOSIS — I517 Cardiomegaly: Secondary | ICD-10-CM

## 2015-09-15 DIAGNOSIS — I2510 Atherosclerotic heart disease of native coronary artery without angina pectoris: Secondary | ICD-10-CM

## 2015-09-15 MED ORDER — CARVEDILOL 6.25 MG PO TABS: 6.2500 mg | ORAL_TABLET | Freq: Two times a day (BID) | ORAL | Status: DC

## 2015-09-15 MED ORDER — TICAGRELOR 90 MG PO TABS: 90.0000 mg | ORAL_TABLET | Freq: Two times a day (BID) | ORAL | Status: DC

## 2015-09-15 NOTE — Patient Instructions (Addendum)
Medication Instructions:  Increase your Carvedilol (Coreg) to 6.25 mg Twice daily  -  A new prescription was sent to your pharmacy.  Labwork: In 6 weeks - Lipids, LFTs Testing/Procedures: None  Follow-Up: Dr. Lauree Chandler 12/18/15 @ 11:15 Any Other Special Instructions Will Be Listed Below (If Applicable). When you are finishing your 1st bottle of Brilinta, if you are still short of breath, call me.  I will change you to something else to take the place of Brilinta. Keep your appointment with Ms. Hollis on 4/27. Her office may be able to help with medications.  They have a pharmacy.   If you need a refill on your cardiac medications before your next appointment, please call your pharmacy.

## 2015-09-21 ENCOUNTER — Ambulatory Visit: Payer: Self-pay | Admitting: Family Medicine

## 2015-10-06 ENCOUNTER — Other Ambulatory Visit: Payer: Self-pay | Admitting: Family Medicine

## 2015-10-09 ENCOUNTER — Ambulatory Visit: Payer: Self-pay | Admitting: Family Medicine

## 2015-10-26 ENCOUNTER — Telehealth: Payer: Self-pay | Admitting: Pharmacist

## 2015-10-26 NOTE — Telephone Encounter (Signed)
Patient received Brilinta samples on 09/15/15 that were in the recalled lot on 10/20/15. Informed patient that no issues were found with the Brilinta tablets, but that 1 bottle in that particular lot contained a blue pill that treats gout. Patient reports that he did not see any blue tablets in the bottles he has used so far. He still has a few bottles left. Advised pt that he could bring in remainder of bottles and exchanges them with a different lot. He did not wish to. He will make sure to check remaining bottles and not take a blue pill from the Brilinta bottle if he sees one. He thanked me for informing him of the recall.

## 2015-11-01 ENCOUNTER — Telehealth: Payer: Self-pay | Admitting: *Deleted

## 2015-11-01 MED ORDER — CLOPIDOGREL BISULFATE 75 MG PO TABS: 75.0000 mg | ORAL_TABLET | Freq: Every day | ORAL | Status: DC

## 2015-11-01 NOTE — Telephone Encounter (Signed)
Patient called to report that he is unable to afford the brilinta. He stated that he does not have any insurance and will have to pay oop for the medication and even with the savings card the cost is still too high for him. I mentioned patient assistance and he stated that he had already applied and did not qualify. He would like to know what other options there are. He can be reached at [phone]. Please advise. Thanks, MI

## 2015-11-01 NOTE — Telephone Encounter (Signed)
He can be changed to Plavix 75 mg po daily. Stop Brilinta. cdm

## 2015-11-01 NOTE — Telephone Encounter (Signed)
Spoke with pt and gave him instructions from Dr. Angelena Form. He will finish current supply of Brilinta and then change to Clopidogrel.  Will send prescription to [facility] on [address].

## 2015-11-01 NOTE — Telephone Encounter (Signed)
Spoke with pt. He states Brilinta is $280 per month and he is having trouble affording this.  Asking if there is a cheaper alternative.  He was scheduled in [facility] but cancelled this as he has a primary care doctor in [place]. He is unable to come in for fasting lab work in June. Requests July appt on a Tuesday.  Appt made for fasting lab work on July 11,2017

## 2015-11-17 ENCOUNTER — Encounter: Payer: Self-pay | Admitting: Cardiovascular Disease

## 2015-12-05 ENCOUNTER — Telehealth: Payer: Self-pay | Admitting: *Deleted

## 2015-12-05 ENCOUNTER — Other Ambulatory Visit (INDEPENDENT_AMBULATORY_CARE_PROVIDER_SITE_OTHER): Payer: Self-pay | Admitting: *Deleted

## 2015-12-05 DIAGNOSIS — E785 Hyperlipidemia, unspecified: Secondary | ICD-10-CM

## 2015-12-05 DIAGNOSIS — I2510 Atherosclerotic heart disease of native coronary artery without angina pectoris: Secondary | ICD-10-CM

## 2015-12-05 LAB — LIPID PANEL
CHOLESTEROL: 141 mg/dL (ref 125–200)
HDL: 32 mg/dL — ABNORMAL LOW (ref >=40)
LDL Cholesterol: 81 mg/dL (ref <130)
Total CHOL/HDL Ratio: 4.4 Ratio (ref <=5.0)
Triglycerides: 139 mg/dL (ref <150)
VLDL: 28 mg/dL (ref <30)

## 2015-12-05 LAB — HEPATIC FUNCTION PANEL
ALK PHOS: 58 U/L (ref 40–115)
ALT: 15 U/L (ref 9–46)
AST: 15 U/L (ref 10–35)
Albumin: 4.3 g/dL (ref 3.6–5.1)
BILIRUBIN DIRECT: 0.1 mg/dL (ref <=0.2)
BILIRUBIN INDIRECT: 0.4 mg/dL (ref 0.2–1.2)
BILIRUBIN TOTAL: 0.5 mg/dL (ref 0.2–1.2)
Total Protein: 7.1 g/dL (ref 6.1–8.1)

## 2015-12-05 NOTE — Telephone Encounter (Signed)
Lmtcb to go over lab results and recommendations.  

## 2015-12-06 MED ORDER — ROSUVASTATIN CALCIUM 40 MG PO TABS: 40.0000 mg | ORAL_TABLET | Freq: Every day | ORAL | Status: DC

## 2015-12-06 NOTE — Telephone Encounter (Signed)
Follow Up:   Pt returning call to get his lab results please.

## 2015-12-06 NOTE — Telephone Encounter (Signed)
Pt is aware of lab results and recommendations. Pt is willing to  take Crestor 40 mg once daily. A prescription was sent to [facility] at [place]. An appointment was made for pt for labs in 3 months, October 17 th 2017. Pt verbalized understanding.

## 2015-12-18 ENCOUNTER — Ambulatory Visit: Payer: Self-pay | Admitting: Cardiovascular Disease

## 2016-01-15 ENCOUNTER — Other Ambulatory Visit: Payer: Self-pay | Admitting: Family Medicine

## 2016-01-20 ENCOUNTER — Other Ambulatory Visit: Payer: Self-pay | Admitting: Family Medicine

## 2016-01-22 ENCOUNTER — Telehealth: Payer: Self-pay | Admitting: Family Medicine

## 2016-01-22 NOTE — Telephone Encounter (Signed)
Patient has not been seen by me, will need an appointment for medication refills

## 2016-01-24 NOTE — Telephone Encounter (Signed)
Patient is requesting a refill on metFormin '1000mg'$  and he is out.  Patient is transferring his care to another practice in [place] and has an appointment on 02/18/16.  He would like a 30 day supply.  Patient uses the [facility] on [address].

## 2016-01-30 ENCOUNTER — Ambulatory Visit: Payer: Self-pay | Admitting: Family Medicine

## 2016-02-16 NOTE — Telephone Encounter (Signed)
Routed to Dr. Manuella Ghazi Patient is requesting a refill on metFormin '1000mg'$  and he is out.  Patient is transferring his care to another practice in [place] and has an appointment on 02/18/16.  He would like a 30 day supply.  Patient uses the [facility] on [address].

## 2016-02-16 NOTE — Telephone Encounter (Signed)
Patient is unknown to me, has not had any recent lab work. He will need an office visit appointment to obtain lab work and discuss medications

## 2016-02-23 ENCOUNTER — Ambulatory Visit: Payer: Self-pay | Admitting: Cardiovascular Disease

## 2016-03-12 ENCOUNTER — Other Ambulatory Visit: Payer: Self-pay | Admitting: *Deleted

## 2016-03-12 DIAGNOSIS — E785 Hyperlipidemia, unspecified: Secondary | ICD-10-CM

## 2016-03-12 LAB — LIPID PANEL
CHOLESTEROL: 117 mg/dL — AB (ref 125–200)
HDL: 32 mg/dL — ABNORMAL LOW (ref >=40)
LDL Cholesterol: 68 mg/dL (ref <130)
Total CHOL/HDL Ratio: 3.7 Ratio (ref <=5.0)
Triglycerides: 86 mg/dL (ref <150)
VLDL: 17 mg/dL (ref <30)

## 2016-03-12 LAB — HEPATIC FUNCTION PANEL
ALK PHOS: 67 U/L (ref 40–115)
ALT: 21 U/L (ref 9–46)
AST: 17 U/L (ref 10–35)
Albumin: 4.1 g/dL (ref 3.6–5.1)
BILIRUBIN DIRECT: 0.1 mg/dL (ref <=0.2)
BILIRUBIN INDIRECT: 0.3 mg/dL (ref 0.2–1.2)
BILIRUBIN TOTAL: 0.4 mg/dL (ref 0.2–1.2)
Total Protein: 6.9 g/dL (ref 6.1–8.1)

## 2016-03-13 ENCOUNTER — Telehealth: Payer: Self-pay | Admitting: *Deleted

## 2016-03-13 NOTE — Telephone Encounter (Signed)
Lmtcb to go over  Lab results

## 2016-03-13 NOTE — Telephone Encounter (Signed)
Pt notified of lab results by phone with verbal understanding.  

## 2016-03-13 NOTE — Telephone Encounter (Signed)
Follow Up:    Please call him back at work,just ask for him please. He is returning your call.

## 2016-03-13 NOTE — Telephone Encounter (Signed)
Pt is calling back again for his test results

## 2016-03-19 ENCOUNTER — Ambulatory Visit (INDEPENDENT_AMBULATORY_CARE_PROVIDER_SITE_OTHER): Payer: Self-pay | Admitting: Family Medicine

## 2016-03-19 ENCOUNTER — Encounter: Payer: Self-pay | Admitting: Family Medicine

## 2016-03-19 VITALS — BP 130/49 | HR 76 | Temp 98.3°F | Resp 16 | Ht 75.0 in | Wt 225.0 lb

## 2016-03-19 DIAGNOSIS — E119 Type 2 diabetes mellitus without complications: Secondary | ICD-10-CM

## 2016-03-19 DIAGNOSIS — Z1211 Encounter for screening for malignant neoplasm of colon: Secondary | ICD-10-CM

## 2016-03-19 DIAGNOSIS — Z125 Encounter for screening for malignant neoplasm of prostate: Secondary | ICD-10-CM

## 2016-03-19 DIAGNOSIS — Z1159 Encounter for screening for other viral diseases: Secondary | ICD-10-CM

## 2016-03-19 DIAGNOSIS — Z114 Encounter for screening for human immunodeficiency virus [HIV]: Secondary | ICD-10-CM

## 2016-03-19 LAB — CBC WITH DIFFERENTIAL/PLATELET
BASOS ABS: 0 {cells}/uL (ref 0–200)
Basophils Relative: 0 %
EOS PCT: 2 %
Eosinophils Absolute: 134 cells/uL (ref 15–500)
HCT: 39.1 % (ref 38.5–50.0)
Hemoglobin: 12.7 g/dL — ABNORMAL LOW (ref 13.2–17.1)
LYMPHS ABS: 3618 {cells}/uL (ref 850–3900)
Lymphocytes Relative: 54 %
MCH: 28.7 pg (ref 27.0–33.0)
MCHC: 32.5 g/dL (ref 32.0–36.0)
MCV: 88.5 fL (ref 80.0–100.0)
MONOS PCT: 7 %
MPV: 9.3 fL (ref 7.5–12.5)
Monocytes Absolute: 469 cells/uL (ref 200–950)
NEUTROS ABS: 2479 {cells}/uL (ref 1500–7800)
Neutrophils Relative %: 37 %
PLATELETS: 262 10*3/uL (ref 140–400)
RBC: 4.42 MIL/uL (ref 4.20–5.80)
RDW: 13.3 % (ref 11.0–15.0)
WBC: 6.7 10*3/uL (ref 3.8–10.8)

## 2016-03-19 LAB — POCT GLYCOSYLATED HEMOGLOBIN (HGB A1C): Hemoglobin A1C: 7.5

## 2016-03-19 MED ORDER — METFORMIN HCL 1000 MG PO TABS: 1000.0000 mg | ORAL_TABLET | Freq: Two times a day (BID) | ORAL | 1 refills | Status: DC

## 2016-03-19 MED ORDER — LISINOPRIL 10 MG PO TABS: 10.0000 mg | ORAL_TABLET | Freq: Every day | ORAL | 1 refills | Status: DC

## 2016-03-19 MED ORDER — GLIMEPIRIDE 4 MG PO TABS
4.0000 mg | ORAL_TABLET | Freq: Every day | ORAL | 1 refills | Status: DC
Start: 2016-03-19 — End: 2016-11-13

## 2016-03-20 LAB — MICROALBUMIN, URINE: MICROALB UR: 1.1 mg/dL

## 2016-03-20 LAB — PSA: PSA: 0.4 ng/mL (ref <=4.0)

## 2016-03-20 LAB — HEPATITIS C ANTIBODY: HCV Ab: NEGATIVE

## 2016-03-20 LAB — HIV ANTIBODY (ROUTINE TESTING W REFLEX): HIV: NONREACTIVE

## 2016-03-20 NOTE — Progress Notes (Signed)
Kirk [name], is a 60 y.o. male  KGY:[id number]  WYO:[id number]  DOB - [birth year]  CC:  Chief Complaint  Patient presents with  . Establish Care  . Hypertension  . Diabetes       HPI: Kirk [name] is a 60 y.o. male here to establish care and receive treatment for diabetes and hypertension. He has been off medication for about a month. In addition to DM and HTN, he is followed by cardiology for MI, PAD, PVD. He also has DDD. He has most recently been on metformin 1000 bid, glimeperide 4 md daily and lisinopril 10. He needs refills from [place] on these medications. He is on Plavix, Coreg and Crestor per cardiology. He reports generally feeling well. He denies recent chest pain.He stopped smoking 15 years ago after smoking for 35 years. He denies alcohol or drug use. He does not follow a particular dietary plan and does not exercise regularly.   His A1C is 7.5 today, up from 7.3 three months ago.   He is in need of screening for HIV, HEP C, Colon cancer and prostate cancer. He declines immunizations today.  No Known Allergies Past Medical History:  Diagnosis Date  . Arthritis    "right knee" (09/04/2015)  . HTN (hypertension)   . Hyperlipidemia   . NSTEMI (non-ST elevated myocardial infarction) ([place]) 09/04/2015   a. cath 09/05/2015: 95% stenosis mid-LAD, 55% mid-RCA, and 40% prox-RCA. PCI performed w/ Synergy DES to LAD  . PAD (peripheral artery disease) (HCC)    Stenting of bilateral iliacs in 2003.  [name] Refusal of blood transfusions as patient is Jehovah's Witness   . Type II diabetes mellitus ([place])    Current Outpatient Prescriptions on File Prior to Visit  Medication Sig Dispense Refill  . aspirin 81 MG tablet Take 81 mg by mouth daily.    . carvedilol (COREG) 6.25 MG tablet Take 1 tablet (6.25 mg total) by mouth 2 (two) times daily with a meal. 60 tablet 11  . clopidogrel (PLAVIX) 75 MG tablet Take 1 tablet (75 mg total) by mouth daily. 30 tablet 6  . nitroGLYCERIN (NITROSTAT) 0.4  MG SL tablet Place 1 tablet (0.4 mg total) under the tongue every 5 (five) minutes x 3 doses as needed for chest pain. 25 tablet 2  . rosuvastatin (CRESTOR) 40 MG tablet Take 1 tablet (40 mg total) by mouth daily. 30 tablet 11  . gabapentin (NEURONTIN) 300 MG capsule Take 300 mg by mouth 3 (three) times daily.    [name] HYDROcodone-acetaminophen (NORCO/VICODIN) 5-325 MG per tablet Take 1-2 tablets by mouth every 4 (four) hours as needed for moderate pain or severe pain. (Patient not taking: Reported on 03/19/2016) 15 tablet 0   No current facility-administered medications on file prior to visit.    Family History  Problem Relation Age of Onset  . Lymphoma Mother   . Cancer - Prostate Father   . CAD Neg Hx    Social History   Social History  . Marital status: Divorced    Spouse name: N/A  . Number of children: 1  . Years of education: N/A   Occupational History  . Furniture salesman    Social History Main Topics  . Smoking status: Former Smoker    Packs/day: 2.00    Years: 35.00    Types: Cigarettes    Quit date: 02/19/2003  . Smokeless tobacco: Never Used  . Alcohol use 0.0 oz/week     Comment: 09/04/2015 "nothing since 1980s"  .  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 Sexual activity: Not on file   Other Topics Concern  . Not on file   Social History Narrative  . No narrative on file    Review of Systems: Constitutional: + for fatigue Skin: Negative HENT: Negative  Eyes: + decreased vision Neck: Negative Respiratory: Negative Cardiovascular: Negative Gastrointestinal: Negative Genitourinary: Negative  Musculoskeletal: Negative   Neurological: Negative  Hematological: Negative  Psychiatric/Behavioral: Negative    Objective:   Vitals:   03/19/16 1428  BP: (!) 130/49  Pulse: 76  Resp: 16  Temp: 98.3 F (36.8 C)    Physical Exam: Constitutional: Patient appears well-developed and well-nourished. No distress. HENT:  Normocephalic, atraumatic, External right and left ear normal. Oropharynx is clear and moist.  Eyes: Conjunctivae and EOM are normal. PERRLA, no scleral icterus. Neck: Normal ROM. Neck supple. No lymphadenopathy, No thyromegaly. CVS: RRR, S1/S2 +, no murmurs, no gallops, no rubs Pulmonary: Effort and breath sounds normal, no stridor, rhonchi, wheezes, rales.  Abdominal: Soft. Normoactive BS,, no distension, tenderness, rebound or guarding.  Musculoskeletal: Normal range of motion. No edema and no tenderness.  Neuro: Alert.Normal muscle tone coordination. Non-focal Skin: Skin is warm and dry. No rash noted. Not diaphoretic. No erythema. No pallor. Psychiatric: Normal mood and affect. Behavior, judgment, thought content normal.  Lab Results  Component Value Date   WBC 7.4 09/06/2015   HGB 12.5 (L) 09/06/2015   HCT 38.5 (L) 09/06/2015   MCV 89.1 09/06/2015   PLT 224 09/06/2015   Lab Results  Component Value Date   CREATININE 0.83 09/06/2015   BUN 8 09/06/2015   NA 141 09/06/2015   K 3.7 09/06/2015   CL 108 09/06/2015   CO2 21 (L) 09/06/2015    Lab Results  Component Value Date   HGBA1C 7.5 03/19/2016   Lipid Panel     Component Value Date/Time   CHOL 117 (L) 03/12/2016 0837   CHOL 164 06/08/2015 0840   TRIG 86 03/12/2016 0837   HDL 32 (L) 03/12/2016 0837   HDL 35 (L) 06/08/2015 0840   CHOLHDL 3.7 03/12/2016 0837   VLDL 17 03/12/2016 0837   LDLCALC 68 03/12/2016 0837   LDLCALC 101 (H) 06/08/2015 0840       Assessment and plan:   1. Type 2 diabetes mellitus without complication, without long-term current use of insulin (HCC)  - CBC with Differential - Microalbumin, urine - POCT glycosylated hemoglobin (Hb A1C)  2. Encounter for hepatitis C screening test for low risk patient  - Hepatitis C Antibody  3. Screening for HIV (human immunodeficiency virus)  - HIV antibody (with reflex)  4. Screening for malignant neoplasm of prostate  - PSA  5. Special  screening for malignant neoplasms, colon  - POC Hemoccult Bld/Stl (3-Cd Home Screen); Future   No Follow-up on file.  The patient was given clear instructions to go to ER or return to medical center if symptoms don't improve, worsen or new problems develop. The patient verbalized understanding.    Micheline Chapman FNP  03/20/2016, 1:17 PM

## 2016-03-20 NOTE — Patient Instructions (Signed)
Have refilled your previous medications Follow-up in 3 months Work on diabetic diet and increase in exercise.

## 2016-05-02 NOTE — Progress Notes (Signed)
Chief Complaint  Patient presents with  . Follow-up     History of Present Illness: 60 yo male with history of PAD, HLD, HTN, DM who is here today for follow up. I met him once in 2014. He has a history of PAD s/p bilateral lower extremity stenting in 2013. He had his first cardiac event in April 2017 when he was admitted to [facility] with a NSTEMI. Cardiac cath 09/05/15 per Dr. Claiborne Billings with high grade mid LAD stenosis treated with a DES x 1. Mild to moderate disease in the Circumflex and RCA. Echo April 2017 with normal LV systolic function. He is a former smoker.   He is here today for follow up. He tells me that he feels well overall. No chest pain. NO dyspnea. No LE pain with walking. He is exercising 3 days per week.   Primary Care Physician: Sharon Seller, NP  Past Medical History:  Diagnosis Date  . Arthritis    "right knee" (09/04/2015)  . HTN (hypertension)   . Hyperlipidemia   . NSTEMI (non-ST elevated myocardial infarction) ([place]) 09/04/2015   a. cath 09/05/2015: 95% stenosis mid-LAD, 55% mid-RCA, and 40% prox-RCA. PCI performed w/ Synergy DES to LAD  . PAD (peripheral artery disease) (HCC)    Stenting of bilateral iliacs in 2003.  [name] Refusal of blood transfusions as patient is Jehovah's Witness   . Type II diabetes mellitus ([place])     Past Surgical History:  Procedure Laterality Date  . CARDIAC CATHETERIZATION N/A 09/05/2015   Procedure: Left Heart Cath and Coronary Angiography;  Surgeon: Troy Sine, MD;  Location: [facility];  Service: Cardiovascular;  Laterality: N/A;  . CARDIAC CATHETERIZATION N/A 09/05/2015   Procedure: Coronary Stent Intervention;  Surgeon: Troy Sine, MD;  Location: [facility];  Service: Cardiovascular;  Laterality: N/A;  . 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 PERIPHERAL VASCULAR CATHETERIZATION Bilateral 2003   "stenting"  . TONSILLECTOMY  ~ 1963    Current Outpatient Prescriptions  Medication Sig  Dispense Refill  . aspirin 81 MG tablet Take 81 mg by mouth daily.    . clopidogrel (PLAVIX) 75 MG tablet Take 1 tablet (75 mg total) by mouth daily. 30 tablet 6  . glimepiride (AMARYL) 4 MG tablet Take 1 tablet (4 mg total) by mouth daily. 90 tablet 1  . lisinopril (PRINIVIL,ZESTRIL) 10 MG tablet Take 1 tablet (10 mg total) by mouth daily. 90 tablet 1  . metFORMIN (GLUCOPHAGE) 1000 MG tablet Take 1 tablet (1,000 mg total) by mouth 2 (two) times daily. 180 tablet 1  . nitroGLYCERIN (NITROSTAT) 0.4 MG SL tablet Place 1 tablet (0.4 mg total) under the tongue every 5 (five) minutes x 3 doses as needed for chest pain. 25 tablet 2  . rosuvastatin (CRESTOR) 40 MG tablet Take 1 tablet (40 mg total) by mouth daily. 30 tablet 11  . carvedilol (COREG) 6.25 MG tablet Take 1 tablet (6.25 mg total) by mouth 2 (two) times daily with a meal. 60 tablet 11   No current facility-administered medications for this visit.     No Known Allergies  Social History   Social History  . Marital status: Divorced    Spouse name: N/A  . Number of children: 1  . Years of education: N/A   Occupational History  . Furniture salesman    Social History Main Topics  . Smoking status: Former Smoker    Packs/day:  2.00    Years: 35.00    Types: Cigarettes    Quit date: 02/19/2003  . Smokeless tobacco: Never Used  . Alcohol use 0.0 oz/week     Comment: 09/04/2015 "nothing since 1980s"  . 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 Sexual activity: Not on file   Other Topics Concern  . Not on file   Social History Narrative  . No narrative on file    Family History  Problem Relation Age of Onset  . Lymphoma Mother   . Cancer - Prostate Father   . CAD Neg Hx     Review of Systems:  As stated in the HPI and otherwise negative.   BP 130/60   Pulse 77   Ht 6' 3"  (1.905 m)   Wt 224 lb (101.6 kg)   BMI 28.00 kg/m   Physical Examination: General: Well developed, well  nourished, NAD  HEENT: OP clear, mucus membranes moist  SKIN: warm, dry. No rashes. Neuro: No focal deficits  Musculoskeletal: Muscle strength 5/5 all ext  Psychiatric: Mood and affect normal  Neck: No JVD, no carotid bruits, no thyromegaly, no lymphadenopathy.  Lungs:Clear bilaterally, no wheezes, rhonci, crackles Cardiovascular: Regular rate and rhythm. No murmurs, gallops or rubs. Abdomen:Soft. Bowel sounds present. Non-tender.  Extremities: No lower extremity edema. Pulses are 2 + in the bilateral DP/PT.  Echo 09/06/15: Left ventricle: The cavity size was normal. There was moderate   focal basal hypertrophy of the septum. Systolic function was   normal. The estimated ejection fraction was in the range of 60%   to 65%. Wall motion was normal; there were no regional wall   motion abnormalities. Doppler parameters are consistent with   abnormal left ventricular relaxation (grade 1 diastolic   dysfunction). The E/e&' ratio is between 8-15, suggesting   indeterminate LV filling pressure. - Aortic valve: Trileaflet. Cusp separation was normal. There was   no stenosis. Subvalvular LVOT acceleration is noted. There was no   regurgitation. - Mitral valve: SAM. Mildly thickened leaflets . There was trivial   regurgitation. - Left atrium: The atrium was normal in size. - Inferior vena cava: The vessel was normal in size. The   respirophasic diameter changes were in the normal range (>= 50%),   consistent with normal central venous pressure.  EKG:  EKG is not ordered today. The ekg ordered today demonstrates   Recent Labs: 09/04/2015: TSH 1.083 09/06/2015: BUN 8; Creatinine, Ser 0.83; Potassium 3.7; Sodium 141 03/12/2016: ALT 21 03/19/2016: Hemoglobin 12.7; Platelets 262   Lipid Panel    Component Value Date/Time   CHOL 117 (L) 03/12/2016 0837   CHOL 164 06/08/2015 0840   TRIG 86 03/12/2016 0837   HDL 32 (L) 03/12/2016 0837   HDL 35 (L) 06/08/2015 0840   CHOLHDL 3.7 03/12/2016  0837   VLDL 17 03/12/2016 0837   LDLCALC 68 03/12/2016 0837   LDLCALC 101 (H) 06/08/2015 0840     Wt Readings from Last 3 Encounters:  05/03/16 224 lb (101.6 kg)  03/19/16 225 lb (102.1 kg)  09/15/15 226 lb 6.4 oz (102.7 kg)     Other studies Reviewed: Additional studies/ records that were reviewed today include: . Review of the above records demonstrates:   Assessment and Plan:   1. CAD: NSTEMI April 2017 with severe stenosis mid LAD treated with DES x 1. He is on ASA, Plavix, statin, beta blocker and Ace inh.  2. HTN: BP controlled. No changes  3. HLD: Continue statin  4. LVH: Subvalvular LVOT acceleration with SAM on echo. Continue beta blocker  5. PAD: No leg pain with walking. Will arrange ABI today.   Current medicines are reviewed at length with the patient today.  The patient does not have concerns regarding medicines.  The following changes have been made:  no change  Labs/ tests ordered today include:  No orders of the defined types were placed in this encounter.    Disposition:   FU with me in 12 months   Signed, Lauree Chandler, MD 05/03/2016 8:53 AM    [facility] [place], [place], [place]  [address] Phone: [phone]; Fax: [phone]

## 2016-05-03 ENCOUNTER — Encounter (INDEPENDENT_AMBULATORY_CARE_PROVIDER_SITE_OTHER): Payer: Self-pay

## 2016-05-03 ENCOUNTER — Ambulatory Visit (INDEPENDENT_AMBULATORY_CARE_PROVIDER_SITE_OTHER): Payer: Self-pay | Admitting: Cardiovascular Disease

## 2016-05-03 VITALS — BP 130/60 | HR 77 | Ht 75.0 in | Wt 224.0 lb

## 2016-05-03 DIAGNOSIS — I2510 Atherosclerotic heart disease of native coronary artery without angina pectoris: Secondary | ICD-10-CM

## 2016-05-03 DIAGNOSIS — I10 Essential (primary) hypertension: Secondary | ICD-10-CM

## 2016-05-03 DIAGNOSIS — I739 Peripheral vascular disease, unspecified: Secondary | ICD-10-CM

## 2016-05-03 DIAGNOSIS — E7800 Pure hypercholesterolemia, unspecified: Secondary | ICD-10-CM

## 2016-05-03 DIAGNOSIS — I517 Cardiomegaly: Secondary | ICD-10-CM

## 2016-05-03 NOTE — Patient Instructions (Signed)
Medication Instructions:  Your physician recommends that you continue on your current medications as directed. Please refer to the Current Medication list given to you today.   Labwork: none  Testing/Procedures: Your physician has requested that you have an ankle brachial index (ABI). During this test an ultrasound and blood pressure cuff are used to evaluate the arteries that supply the arms and legs with blood. Allow thirty minutes for this exam. There are no restrictions or special instructions.    Follow-Up: Your physician recommends that you schedule a follow-up appointment in: 12 months. Please call our office in about 9 months to schedule this appointment    Any Other Special Instructions Will Be Listed Below (If Applicable).     If you need a refill on your cardiac medications before your next appointment, please call your pharmacy.   

## 2016-05-30 ENCOUNTER — Inpatient Hospital Stay (HOSPITAL_COMMUNITY): Admission: RE | Admit: 2016-05-30 | Payer: Self-pay | Source: Ambulatory Visit

## 2016-06-03 ENCOUNTER — Other Ambulatory Visit: Payer: Self-pay | Admitting: Cardiovascular Disease

## 2016-06-03 NOTE — Telephone Encounter (Signed)
Rx refill sent to pharmacy. 

## 2016-06-18 ENCOUNTER — Ambulatory Visit: Payer: Self-pay | Admitting: Family Medicine

## 2016-07-02 ENCOUNTER — Ambulatory Visit (INDEPENDENT_AMBULATORY_CARE_PROVIDER_SITE_OTHER): Payer: Self-pay | Admitting: Family Medicine

## 2016-07-02 ENCOUNTER — Encounter: Payer: Self-pay | Admitting: Family Medicine

## 2016-07-02 VITALS — BP 142/62 | HR 76 | Temp 97.5°F | Resp 16 | Ht 75.0 in | Wt 234.0 lb

## 2016-07-02 DIAGNOSIS — E785 Hyperlipidemia, unspecified: Secondary | ICD-10-CM

## 2016-07-02 DIAGNOSIS — I10 Essential (primary) hypertension: Secondary | ICD-10-CM

## 2016-07-02 DIAGNOSIS — E119 Type 2 diabetes mellitus without complications: Secondary | ICD-10-CM

## 2016-07-02 LAB — POCT URINALYSIS DIP (DEVICE)
BILIRUBIN URINE: NEGATIVE
GLUCOSE, UA: 100 mg/dL — AB
HGB URINE DIPSTICK: NEGATIVE
KETONES UR: NEGATIVE mg/dL
LEUKOCYTES UA: NEGATIVE
Nitrite: NEGATIVE
Protein, ur: NEGATIVE mg/dL
SPECIFIC GRAVITY, URINE: 1.025 (ref 1.005–1.030)
Urobilinogen, UA: 1 mg/dL (ref 0.0–1.0)
pH: 5.5 (ref 5.0–8.0)

## 2016-07-02 LAB — BASIC METABOLIC PANEL
BUN: 16 mg/dL (ref 7–25)
CHLORIDE: 104 mmol/L (ref 98–110)
CO2: 25 mmol/L (ref 20–31)
Calcium: 9.5 mg/dL (ref 8.6–10.3)
Creat: 0.95 mg/dL (ref 0.70–1.25)
Glucose, Bld: 193 mg/dL — ABNORMAL HIGH (ref 65–99)
POTASSIUM: 4.2 mmol/L (ref 3.5–5.3)
SODIUM: 138 mmol/L (ref 135–146)

## 2016-07-02 LAB — POCT GLYCOSYLATED HEMOGLOBIN (HGB A1C): Hemoglobin A1C: 7.4

## 2016-07-02 NOTE — Progress Notes (Signed)
Subjective:    Patient ID: Kirk [name], male    DOB: 13-Sep-1955, 61 y.o.   MRN: [id number]  Diabetes  He presents for his follow-up diabetic visit. He has type 2 diabetes mellitus. His disease course has been stable. There are no hypoglycemic associated symptoms. Pertinent negatives for hypoglycemia include no sweats. Pertinent negatives for diabetes include no blurred vision, no chest pain, no fatigue, no foot ulcerations, no polydipsia, no polyphagia, no polyuria, no visual change, no weakness and no weight loss. There are no hypoglycemic complications. There are no diabetic complications. Risk factors for coronary artery disease include dyslipidemia, male sex and hypertension. Current diabetic treatment includes oral agent (monotherapy). He is compliant with treatment most of the time. He has not had a previous visit with a dietitian. An ACE inhibitor/angiotensin II receptor blocker is being taken. He sees a podiatrist.Eye exam is current.  Hypertension  This is a chronic problem. The current episode started more than 1 year ago. The problem is controlled. Pertinent negatives include no blurred vision, chest pain, peripheral edema, PND, shortness of breath or sweats. There are no associated agents to hypertension. Risk factors for coronary artery disease include diabetes mellitus, male gender and obesity. Past treatments include ACE inhibitors. The current treatment provides mild improvement. There are no compliance problems.    Past Medical History:  Diagnosis Date  . Arthritis    "right knee" (09/04/2015)  . HTN (hypertension)   . Hyperlipidemia   . NSTEMI (non-ST elevated myocardial infarction) (HCC) 09/04/2015   a. cath 09/05/2015: 95% stenosis mid-LAD, 55% mid-RCA, and 40% prox-RCA. PCI performed w/ Synergy DES to LAD  . PAD (peripheral artery disease) (HCC)    Stenting of bilateral iliacs in 2003.  [name] Refusal of blood transfusions as patient is Jehovah's Witness   . Type II diabetes  mellitus (HCC)    Social History   Social History  . Marital status: Divorced    Spouse name: N/A  . Number of children: 1  . Years of education: N/A   Occupational History  . Furniture salesman    Social History Main Topics  . Smoking status: Former Smoker    Packs/day: 2.00    Years: 35.00    Types: Cigarettes    Quit date: 02/19/2003  . Smokeless tobacco: Never Used  . Alcohol use 0.0 oz/week     Comment: 09/04/2015 "nothing since 1980s"  . 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 Sexual activity: Not on file   Other Topics Concern  . Not on file   Social History Narrative  . No narrative on file     Review of Systems  Constitutional: Negative.  Negative for fatigue and weight loss.  HENT: Negative.   Eyes: Negative.  Negative for blurred vision.  Respiratory: Negative.  Negative for shortness of breath.   Cardiovascular: Negative.  Negative for chest pain and PND.  Gastrointestinal: Negative.   Endocrine: Negative.  Negative for polydipsia, polyphagia and polyuria.  Genitourinary: Negative.   Musculoskeletal: Negative.   Skin: Negative.   Allergic/Immunologic: Negative.   Neurological: Negative.  Negative for weakness.  Hematological: Negative.   Psychiatric/Behavioral: Negative.        Objective:   Physical Exam  Constitutional: He is oriented to person, place, and time. He appears well-developed and well-nourished.  HENT:  Head: Normocephalic and atraumatic.  Right Ear: External ear normal.  Mouth/Throat: Oropharynx is clear and moist.  Eyes: Conjunctivae and EOM are normal. Pupils are equal, round, and reactive to light.  Neck: Normal range of motion.  Cardiovascular: Normal rate, regular rhythm, normal heart sounds and intact distal pulses.   Pulmonary/Chest: Effort normal and breath sounds normal.  Abdominal: Soft. Bowel sounds are normal.  Musculoskeletal: Normal range of motion.  Neurological: He is alert  and oriented to person, place, and time. He has normal reflexes.  Skin: Skin is warm and dry.  Psychiatric: He has a normal mood and affect. His behavior is normal. Judgment and thought content normal.      BP (!) 148/78 (BP Location: Right Arm, Patient Position: Sitting, Cuff Size: Large)   Pulse 76   Temp 97.5 F (36.4 C) (Oral)   Resp 16   Ht 6\' 3"  (1.905 m)   Wt 234 lb (106.1 kg)   SpO2 100%   BMI 29.25 kg/m  Assessment & Plan:  1. Type 2 diabetes mellitus without complication, without long-term current use of insulin (HCC) Recommend a lowfat, low carbohydrate diet divided over 5-6 small meals, increase water intake to 6-8 glasses, and 150 minutes per week of cardiovascular exercise.   - HgB A1c - Basic Metabolic Panel  2. Essential hypertension The patient is asked to make an attempt to improve diet and exercise patterns to aid in medical management of this problem. - Basic Metabolic Panel  3. Hyperlipidemia, unspecified hyperlipidemia type Will continue Crestor 40 mg and Plavix as previously prescribed. Follow up with cardiologist.     Diabetic Foot Exam - Simple   Simple Foot Form Diabetic Foot exam was performed with the following findings:  Yes 07/02/2016  8:15 AM  Visual Inspection No deformities, no ulcerations, no other skin breakdown bilaterally:  Yes Sensation Testing Intact to touch and monofilament testing bilaterally:  Yes Pulse Check Posterior Tibialis and Dorsalis pulse intact bilaterally:  Yes Comments      Preventative maintenance:  Patient refuses vaccinations and prostate examination  RTC: 3 months  Neftali Thurow M, FNP

## 2016-09-18 ENCOUNTER — Telehealth: Payer: Self-pay | Admitting: Cardiovascular Disease

## 2016-09-18 NOTE — Telephone Encounter (Signed)
New message    Pt is calling concerned because he says it feels like he has indigestion real bad and just a tad short of breath. He says it has been going on 3/4 weeks. He said he feels the feeling when he is sitting down at night.

## 2016-09-18 NOTE — Telephone Encounter (Signed)
Pt called because he is having  indigestion with slight shortness of breath, specially in the night before he goes to bed when he is sitting at the side of the bed.  it happened during the day for the last  3 to 4 weeks. Pt said that he never had taken anything for indigestion before. Pt states that he had   a heart attach before, and this indigestion is not the same. Pt was recommended to call his PCP to see what he suggest for indigestion or if he thinks he needs to be seen by his cardiologist . Pt to call back if needed. Pt verbalized understanding.

## 2016-09-18 NOTE — Telephone Encounter (Signed)
AGree. Thanks. cdm

## 2016-10-01 ENCOUNTER — Ambulatory Visit (INDEPENDENT_AMBULATORY_CARE_PROVIDER_SITE_OTHER): Payer: Self-pay | Admitting: Family Medicine

## 2016-10-01 ENCOUNTER — Encounter: Payer: Self-pay | Admitting: Family Medicine

## 2016-10-01 VITALS — BP 134/80 | HR 67 | Temp 98.7°F | Resp 16 | Ht 75.0 in | Wt 226.0 lb

## 2016-10-01 DIAGNOSIS — I10 Essential (primary) hypertension: Secondary | ICD-10-CM

## 2016-10-01 DIAGNOSIS — I25119 Atherosclerotic heart disease of native coronary artery with unspecified angina pectoris: Secondary | ICD-10-CM

## 2016-10-01 DIAGNOSIS — E119 Type 2 diabetes mellitus without complications: Secondary | ICD-10-CM

## 2016-10-01 DIAGNOSIS — E785 Hyperlipidemia, unspecified: Secondary | ICD-10-CM

## 2016-10-01 LAB — CBC WITH DIFFERENTIAL/PLATELET
BASOS ABS: 61 {cells}/uL (ref 0–200)
BASOS PCT: 1 %
EOS PCT: 3 %
Eosinophils Absolute: 183 cells/uL (ref 15–500)
HCT: 40.7 % (ref 38.5–50.0)
HEMOGLOBIN: 13.1 g/dL — AB (ref 13.2–17.1)
LYMPHS ABS: 2867 {cells}/uL (ref 850–3900)
Lymphocytes Relative: 47 %
MCH: 28.7 pg (ref 27.0–33.0)
MCHC: 32.2 g/dL (ref 32.0–36.0)
MCV: 89.3 fL (ref 80.0–100.0)
MONOS PCT: 9 %
MPV: 9.1 fL (ref 7.5–12.5)
Monocytes Absolute: 549 cells/uL (ref 200–950)
NEUTROS ABS: 2440 {cells}/uL (ref 1500–7800)
Neutrophils Relative %: 40 %
PLATELETS: 268 10*3/uL (ref 140–400)
RBC: 4.56 MIL/uL (ref 4.20–5.80)
RDW: 13.7 % (ref 11.0–15.0)
WBC: 6.1 10*3/uL (ref 3.8–10.8)

## 2016-10-01 LAB — POCT URINALYSIS DIP (DEVICE)
Bilirubin Urine: NEGATIVE
GLUCOSE, UA: 100 mg/dL — AB
Hgb urine dipstick: NEGATIVE
KETONES UR: NEGATIVE mg/dL
LEUKOCYTES UA: NEGATIVE
Nitrite: NEGATIVE
PROTEIN: 100 mg/dL — AB
Urobilinogen, UA: 1 mg/dL (ref 0.0–1.0)
pH: 5.5 (ref 5.0–8.0)

## 2016-10-01 LAB — COMPLETE METABOLIC PANEL WITH GFR
ALBUMIN: 4.5 g/dL (ref 3.6–5.1)
ALK PHOS: 58 U/L (ref 40–115)
ALT: 25 U/L (ref 9–46)
AST: 22 U/L (ref 10–35)
BUN: 19 mg/dL (ref 7–25)
CO2: 23 mmol/L (ref 20–31)
Calcium: 9.5 mg/dL (ref 8.6–10.3)
Chloride: 106 mmol/L (ref 98–110)
Creat: 1.06 mg/dL (ref 0.70–1.25)
GFR, EST AFRICAN AMERICAN: 87 mL/min (ref >=60)
GFR, EST NON AFRICAN AMERICAN: 75 mL/min (ref >=60)
GLUCOSE: 183 mg/dL — AB (ref 65–99)
POTASSIUM: 4.8 mmol/L (ref 3.5–5.3)
SODIUM: 140 mmol/L (ref 135–146)
Total Bilirubin: 0.4 mg/dL (ref 0.2–1.2)
Total Protein: 7.6 g/dL (ref 6.1–8.1)

## 2016-10-01 LAB — POCT GLYCOSYLATED HEMOGLOBIN (HGB A1C): HEMOGLOBIN A1C: 7.2

## 2016-10-01 MED ORDER — CLOTRIMAZOLE-BETAMETHASONE 1-0.05 % EX CREA: 1.0000 "application " | TOPICAL_CREAM | Freq: Two times a day (BID) | CUTANEOUS | 0 refills | Status: DC

## 2016-10-01 MED ORDER — LISINOPRIL 10 MG PO TABS
10.0000 mg | ORAL_TABLET | Freq: Every day | ORAL | 1 refills | Status: DC
Start: 2016-10-01 — End: 2016-10-01

## 2016-10-01 MED ORDER — LISINOPRIL 10 MG PO TABS: 10.0000 mg | ORAL_TABLET | Freq: Every day | ORAL | 1 refills | Status: DC

## 2016-10-01 NOTE — Patient Instructions (Addendum)
I have refilled your lisinopril.  As we discussed, you are discontinuing your Metformin for 3 months and increase physical activity and adhering to a diabetic diet in efforts of decreasing you A1C. Goal is for A1C to be less than 7.0. Will check in 3 months, if no reduction, we will discuss alternative medications for glycemic control. Continue Glimepiride 4 mg with dinner.      Diabetes Mellitus and Exercise Exercising regularly is important for your overall health, especially when you have diabetes (diabetes mellitus). Exercising is not only about losing weight. It has many health benefits, such as increasing muscle strength and bone density and reducing body fat and stress. This leads to improved fitness, flexibility, and endurance, all of which result in better overall health. Exercise has additional benefits for people with diabetes, including:  Reducing appetite.  Helping to lower and control blood glucose.  Lowering blood pressure.  Helping to control amounts of fatty substances (lipids) in the blood, such as cholesterol and triglycerides.  Helping the body to respond better to insulin (improving insulin sensitivity).  Reducing how much insulin the body needs.  Decreasing the risk for heart disease by:  Lowering cholesterol and triglyceride levels.  Increasing the levels of good cholesterol.  Lowering blood glucose levels. What is my activity plan? Your health care provider or certified diabetes educator can help you make a plan for the type and frequency of exercise (activity plan) that works for you. Make sure that you:  Do at least 150 minutes of moderate-intensity or vigorous-intensity exercise each week. This could be brisk walking, biking, or water aerobics.  Do stretching and strength exercises, such as yoga or weightlifting, at least 2 times a week.  Spread out your activity over at least 3 days of the week.  Get some form of physical activity every day.  Do  not go more than 2 days in a row without some kind of physical activity.  Avoid being inactive for more than 90 minutes at a time. Take frequent breaks to walk or stretch.  Choose a type of exercise or activity that you enjoy, and set realistic goals.  Start slowly, and gradually increase the intensity of your exercise over time. What do I need to know about managing my diabetes?  Check your blood glucose before and after exercising.  If your blood glucose is higher than 240 mg/dL (13.3 mmol/L) before you exercise, check your urine for ketones. If you have ketones in your urine, do not exercise until your blood glucose returns to normal.  Know the symptoms of low blood glucose (hypoglycemia) and how to treat it. Your risk for hypoglycemia increases during and after exercise. Common symptoms of hypoglycemia can include:  Hunger.  Anxiety.  Sweating and feeling clammy.  Confusion.  Dizziness or feeling light-headed.  Increased heart rate or palpitations.  Blurry vision.  Tingling or numbness around the mouth, lips, or tongue.  Tremors or shakes.  Irritability.  Keep a rapid-acting carbohydrate snack available before, during, and after exercise to help prevent or treat hypoglycemia.  Avoid injecting insulin into areas of the body that are going to be exercised. For example, avoid injecting insulin into:  The arms, when playing tennis.  The legs, when jogging.  Keep records of your exercise habits. Doing this can help you and your health care provider adjust your diabetes management plan as needed. Write down:  Food that you eat before and after you exercise.  Blood glucose levels before and after you exercise.  The type and amount of exercise you have done.  When your insulin is expected to peak, if you use insulin. Avoid exercising at times when your insulin is peaking.  When you start a new exercise or activity, work with your health care provider to make sure the  activity is safe for you, and to adjust your insulin, medicines, or food intake as needed.  Drink plenty of water while you exercise to prevent dehydration or heat stroke. Drink enough fluid to keep your urine clear or pale yellow. This information is not intended to replace advice given to you by your health care provider. Make sure you discuss any questions you have with your health care provider. Document Released: 08/03/2003 Document Revised: 12/01/2015 Document Reviewed: 10/23/2015 Elsevier Interactive Patient Education  2017 [employer].

## 2016-10-01 NOTE — Progress Notes (Signed)
Patient ID: Kirk [name], male    DOB: 1955-06-13, 61 y.o.   MRN: [id number]  PCP: Kirk Jun, FNP  Chief Complaint  Patient presents with  . Follow-up    DIABETES    Subjective:  HPI Kirk [name] is a 61 y.o. male presents for diabetes and hypertension follow-up. Medical problems include: PVD, PAD, NSTEMI (stent placement) 2017, former smoker (quit 15 years ago). Hx of bilateral lower extremity stent placement 2013 due to severe PAD. Most recently, he had  NSTEMI with stent placement to LAD x 1.  He was last seen by Dr. Angelena Form at [facility] December 2017 and is well aware that he needs a follow-up visit.  He is without health insurance is concerned about the cost of cardiology visit. Denies any episodes of chest pain, shortness of breath, or dizziness.  [name] is pretty insistent today that he doesn't want to continue Metformin. Last A1C 7.1.  He has been out of medication he reports for about 1 week and feels that he can best manage his diabetes  with diet and exercise. Reports that he has eliminated fatty, fried foods, high carb foods and incorporating more lean meat. He walks on average 40 minutes per day, 20 minutes in the morning and 20 minutes in afternoon. He is willing to continue taking glimepiride 4 mg daily.    [name] reports good blood pressure control since his MI based on readings he has had at medical visits. He is compliant with Lisinopril 10 mg once daily. Denies any headaches or experiencing any persistent coughing.   Social History   Social History  . Marital status: Divorced    Spouse name: N/A  . Number of children: 1  . Years of education: N/A   Occupational History  . Furniture salesman      Family History  Problem Relation Age of Onset  . Lymphoma Mother   . Cancer - Prostate Father   . CAD Neg Hx    Review of Systems See HPI  Patient Active Problem List   Diagnosis Date Noted  . NSTEMI (non-ST elevated myocardial  infarction) ([place]) 09/04/2015  . PVD (peripheral vascular disease) ([place]) 06/08/2015  . DDD (degenerative disc disease), cervical 06/08/2015  . Hyperlipidemia 06/08/2015  . Diabetes mellitus with nephropathy ([place]) 06/08/2015  . Diabetes mellitus with peripheral vascular disease ([place]) 06/08/2015  . Annual physical exam 01/03/2015  . PAD (peripheral artery disease) ([place]) 02/18/2013  . Diabetes ([place]) 02/18/2013  . HTN (hypertension) 02/18/2013    No Known Allergies  Prior to Admission medications   Medication Sig Start Date End Date Taking? Authorizing Provider  aspirin 81 MG tablet Take 81 mg by mouth daily.    [provider]  carvedilol (COREG) 6.25 MG tablet Take 1 tablet (6.25 mg total) by mouth 2 (two) times daily with a meal. 09/15/15   Richardson Dopp T, PA-C  clopidogrel (PLAVIX) 75 MG tablet take 1 tablet by mouth once daily  [place] BR[place] 06/03/16   McAlhany, Annita Brod, MD  glimepiride (AMARYL) 4 MG tablet Take 1 tablet (4 mg total) by mouth daily. 03/19/16   Micheline Chapman, NP  lisinopril (PRINIVIL,ZESTRIL) 10 MG tablet Take 1 tablet (10 mg total) by mouth daily. 03/19/16   Micheline Chapman, NP  metFORMIN (GLUCOPHAGE) 1000 MG tablet Take 1 tablet (1,000 mg total) by mouth 2 (two) times daily. 03/19/16   Micheline Chapman, NP  nitroGLYCERIN (NITROSTAT) 0.4 MG SL tablet Place 1 tablet (0.4  mg total) under the tongue every 5 (five) minutes x 3 doses as needed for chest pain. 09/06/15   Strader, Fransisco Hertz, PA-C  rosuvastatin (CRESTOR) 40 MG tablet Take 1 tablet (40 mg total) by mouth daily. 12/06/15   Liliane Shi PA-C    Past Medical, Surgical Family and Social History reviewed and updated.    Objective:   Today's Vitals   10/01/16 0815  BP: 134/80  Pulse: 67  Resp: 16  Temp: 98.7 F (37.1 C)  TempSrc: Oral  SpO2: 100%  Weight: 226 lb (102.5 kg)  Height: '6\' 3"'$  (1.905 m)    Wt Readings from Last 3 Encounters:  10/01/16 226 lb (102.5 kg)  07/02/16 234  lb (106.1 kg)  05/03/16 224 lb (101.6 kg)    Physical Exam  Constitutional: He is oriented to person, place, and time. He appears well-developed and well-nourished.  HENT:  Head: Normocephalic and atraumatic.  Eyes: Conjunctivae are normal. Pupils are equal, round, and reactive to light.  Neck: Normal range of motion. Neck supple.  Cardiovascular: Normal rate, regular rhythm, normal heart sounds and intact distal pulses.   Pulmonary/Chest: Effort normal and breath sounds normal.  Musculoskeletal: Normal range of motion.  Neurological: He is alert and oriented to person, place, and time.  Skin: Skin is warm and dry.  Psychiatric: He has a normal mood and affect. His behavior is normal. Judgment and thought content normal.       Assessment & Plan:  1. Type 2 diabetes mellitus without complication, without long-term current use of insulin (HCC)-controlled today - POCT glycosylated hemoglobin (Hb A1C)-today 7.4, increased from prior 7.0 Encourage patient to continue Metformin as A1C has actually increased. He is persistent and would like to trial for 3 months discontinuing Metformin. I will have him return in 3 months for a repeat A1C check. If remains less than goal less than 7.0,  will either resume metformin or Invokana.  -Continue glimepiride 4 mg once daily  2. Essential hypertension, controlled -Continue Lisinopril 10 mg daily   3. CAD -Continue carvedilol 6.25 daily -Continue Plavix 75 mg daily  4. Hyperlipidemia, unspecified hyperlipidemia type -Continue Rosuvastatin 40 mg once daily   RTC: 3 months A1C and lipid check   Carroll Sage. Kenton Kingfisher, MSN, [facility] [address] [place], [place] [address] [phone]

## 2016-10-16 ENCOUNTER — Other Ambulatory Visit: Payer: Self-pay | Admitting: Physician Assistant

## 2016-10-16 DIAGNOSIS — I2510 Atherosclerotic heart disease of native coronary artery without angina pectoris: Secondary | ICD-10-CM

## 2016-10-16 DIAGNOSIS — I517 Cardiomegaly: Secondary | ICD-10-CM

## 2016-10-16 DIAGNOSIS — I10 Essential (primary) hypertension: Secondary | ICD-10-CM

## 2016-11-13 ENCOUNTER — Telehealth: Payer: Self-pay

## 2016-11-13 MED ORDER — GLIMEPIRIDE 4 MG PO TABS
4.0000 mg | ORAL_TABLET | Freq: Every day | ORAL | 1 refills | Status: DC
Start: 2016-11-13 — End: 2017-06-07

## 2016-11-13 NOTE — Telephone Encounter (Signed)
Script sent  

## 2016-12-26 ENCOUNTER — Other Ambulatory Visit: Payer: Self-pay | Admitting: Physician Assistant

## 2016-12-31 ENCOUNTER — Ambulatory Visit: Payer: Self-pay | Admitting: Family Medicine

## 2017-01-28 ENCOUNTER — Ambulatory Visit (INDEPENDENT_AMBULATORY_CARE_PROVIDER_SITE_OTHER): Payer: Self-pay | Admitting: Family Medicine

## 2017-01-28 ENCOUNTER — Encounter: Payer: Self-pay | Admitting: Family Medicine

## 2017-01-28 VITALS — BP 127/67 | HR 73 | Temp 97.8°F | Resp 14 | Ht 75.0 in | Wt 224.0 lb

## 2017-01-28 DIAGNOSIS — E1159 Type 2 diabetes mellitus with other circulatory complications: Secondary | ICD-10-CM

## 2017-01-28 DIAGNOSIS — E785 Hyperlipidemia, unspecified: Secondary | ICD-10-CM

## 2017-01-28 LAB — POCT URINALYSIS DIP (DEVICE)
BILIRUBIN URINE: NEGATIVE
Glucose, UA: 100 mg/dL — AB
Hgb urine dipstick: NEGATIVE
Ketones, ur: NEGATIVE mg/dL
LEUKOCYTES UA: NEGATIVE
NITRITE: NEGATIVE
PH: 5.5 (ref 5.0–8.0)
Protein, ur: 30 mg/dL — AB
Specific Gravity, Urine: >=1.03 (ref 1.005–1.030)
UROBILINOGEN UA: 0.2 mg/dL (ref 0.0–1.0)

## 2017-01-28 MED ORDER — METFORMIN HCL 500 MG PO TABS: 500.0000 mg | ORAL_TABLET | Freq: Two times a day (BID) | ORAL | 3 refills | Status: DC

## 2017-01-28 NOTE — Progress Notes (Signed)
Patient ID: Kirk [name], male    DOB: 04-23-1956, 61 y.o.   MRN: [id number]  PCP: Scot Jun, FNP  Chief Complaint  Patient presents with  . Follow-up    3 month    Subjective:  HPI Kirk [name] is a 61 y.o. male presents for evaluation of diabetes. Kirk [name] was last seen in office on 10/01/2016 for chronic disease management. During that visit, Kirk [name] had discontinued taking metformin for type 2 diabetes management in efforts of making lifestyle changes to control his diabetes. Last hemoglobin A1C 7.4 and his current Body mass index is 28 kg/m. Kirk [name] reports efforts to improve diet and increase physical activity. Kirk [name] has a history of PVD, PAD, HTN, and NSTEMI and reports compliance with all other medications. He reports no recent use of nitroglycerin, chest pain, shortness of breath, headache, or dizziness.  Social History   Social History  . Marital status: Divorced    Spouse name: N/A  . Number of children: 1  . Years of education: N/A   Occupational History  . Furniture salesman    Social History Main Topics  . Smoking status: Former Smoker    Packs/day: 2.00    Years: 35.00    Types: Cigarettes    Quit date: 02/19/2003  . Smokeless tobacco: Never Used  . Alcohol use 0.0 oz/week     Comment: 09/04/2015 "nothing since 1980s"  . 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 Sexual activity: Not on file   Other Topics Concern  . Not on file   Social History Narrative  . No narrative on file    Family History  Problem Relation Age of Onset  . Lymphoma Mother   . Cancer - Prostate Father   . CAD Neg Hx    Review of Systems See HPI Patient Active Problem List   Diagnosis Date Noted  . NSTEMI (non-ST elevated myocardial infarction) ([place]) 09/04/2015  . PVD (peripheral vascular disease) ([place]) 06/08/2015  . DDD (degenerative disc disease), cervical 06/08/2015  . Hyperlipidemia 06/08/2015  . Diabetes mellitus with  nephropathy ([place]) 06/08/2015  . Diabetes mellitus with peripheral vascular disease ([place]) 06/08/2015  . Annual physical exam 01/03/2015  . PAD (peripheral artery disease) ([place]) 02/18/2013  . Diabetes ([place]) 02/18/2013  . HTN (hypertension) 02/18/2013    No Known Allergies  Prior to Admission medications   Medication Sig Start Date End Date Taking? Authorizing Provider  aspirin 81 MG tablet Take 81 mg by mouth daily.   Yes [provider]  carvedilol (COREG) 6.25 MG tablet take 1 tablet by mouth twice a day with meals 10/16/16  Yes Richardson Dopp T, PA-C  clopidogrel (PLAVIX) 75 MG tablet take 1 tablet by mouth once daily  STOP BRIL[place]TA 06/03/16  Yes Burnell Blanks, MD  clotrimazole-betamethasone (LOTRISONE) cream Apply 1 application topically 2 (two) times daily. 10/01/16  Yes Scot Jun, FNP  glimepiride (AMARYL) 4 MG tablet Take 1 tablet (4 mg total) by mouth daily. 11/13/16  Yes Scot Jun, FNP  lisinopril (PRINIVIL,ZESTRIL) 10 MG tablet Take 1 tablet (10 mg total) by mouth daily. 10/01/16  Yes Scot Jun, FNP  nitroGLYCERIN (NITROSTAT) 0.4 MG SL tablet Place 1 tablet (0.4 mg total) under the tongue every 5 (five) minutes x 3 doses as needed for chest pain. 09/06/15  Yes Strader, Snyder, PA-C  rosuvastatin (CRESTOR) 40 MG tablet take 1 tablet by mouth once  daily 12/26/16  Yes Liliane Shi, PA-C    Past Medical, Surgical Family and Social History reviewed and updated.    Objective:   Today's Vitals   01/28/17 0832  BP: 127/67  Pulse: 73  Resp: 14  Temp: 97.8 F (36.6 C)  TempSrc: Oral  SpO2: 98%  Weight: 224 lb (101.6 kg)  Height: 6\' 3"  (1.905 m)    Wt Readings from Last 3 Encounters:  01/28/17 224 lb (101.6 kg)  10/01/16 226 lb (102.5 kg)  07/02/16 234 lb (106.1 kg)    Physical Exam  Constitutional: He is oriented to person, place, and time. He appears well-developed and well-nourished.  HENT:  Head: Normocephalic and atraumatic.   Eyes: Pupils are equal, round, and reactive to light. Conjunctivae and EOM are normal.  Neck: Normal range of motion. Neck supple.  Cardiovascular: Normal rate, regular rhythm, normal heart sounds and intact distal pulses.   Pulmonary/Chest: Effort normal and breath sounds normal.  Neurological: He is alert and oriented to person, place, and time.  Skin: Skin is warm and dry.  Psychiatric: He has a normal mood and affect. His behavior is normal. Judgment and thought content normal.   Assessment & Plan:  1. Type 2 diabetes mellitus with other circulatory complication, without long-term current use of insulin (HCC) -Hemoglobin A1C today 8.0, which has increased from prior 3 months.  -Resume metformin 500 mg twice daily with meals -Continue glimepiride  4 mg daily  -Repeat A1C in 3 months and will consider increase metformin or adding another agent is diabetes remains uncontrolled -increase physical activity as tolerated.  2. Hyperlipidemia  -Fasting lipid  -Rosuvastatin (Crestor) 40 mg once daily at 1800.   Orders Placed This Encounter  Procedures  . Lipid Panel  . Microalbumin/Creatinine Ratio, Urine  . CBC with Differential  . Basic metabolic panel  . POCT urinalysis dip (device)  . POCT glycosylated hemoglobin (Hb A1C)    RTC: 3 months for diabetes management    Carroll Sage. Kenton Kingfisher, MSN, FNP-C [facility]  [address] [place], [place] [address] [phone]

## 2017-01-28 NOTE — Patient Instructions (Addendum)
Metformin 500 mg twice daily with meals. Continue glimepiride.    Diabetes Mellitus and Food It is important for you to manage your blood sugar (glucose) level. Your blood glucose level can be greatly affected by what you eat. Eating healthier foods in the appropriate amounts throughout the day at about the same time each day will help you control your blood glucose level. It can also help slow or prevent worsening of your diabetes mellitus. Healthy eating may even help you improve the level of your blood pressure and reach or maintain a healthy weight. General recommendations for healthful eating and cooking habits include:  Eating meals and snacks regularly. Avoid going long periods of time without eating to lose weight.  Eating a diet that consists mainly of plant-based foods, such as fruits, vegetables, nuts, legumes, and whole grains.  Using low-heat cooking methods, such as baking, instead of high-heat cooking methods, such as deep frying.  Work with your dietitian to make sure you understand how to use the Nutrition Facts information on food labels. How can food affect me? Carbohydrates Carbohydrates affect your blood glucose level more than any other type of food. Your dietitian will help you determine how many carbohydrates to eat at each meal and teach you how to count carbohydrates. Counting carbohydrates is important to keep your blood glucose at a healthy level, especially if you are using insulin or taking certain medicines for diabetes mellitus. Alcohol Alcohol can cause sudden decreases in blood glucose (hypoglycemia), especially if you use insulin or take certain medicines for diabetes mellitus. Hypoglycemia can be a life-threatening condition. Symptoms of hypoglycemia (sleepiness, dizziness, and disorientation) are similar to symptoms of having too much alcohol. If your health care provider has given you approval to drink alcohol, do so in moderation and use the following  guidelines:  Women should not have more than one drink per day, and men should not have more than two drinks per day. One drink is equal to: ? 12 oz of beer. ? 5 oz of wine. ? 1 oz of hard liquor.  Do not drink on an empty stomach.  Keep yourself hydrated. Have water, diet soda, or unsweetened iced tea.  Regular soda, juice, and other mixers might contain a lot of carbohydrates and should be counted.  What foods are not recommended? As you make food choices, it is important to remember that all foods are not the same. Some foods have fewer nutrients per serving than other foods, even though they might have the same number of calories or carbohydrates. It is difficult to get your body what it needs when you eat foods with fewer nutrients. Examples of foods that you should avoid that are high in calories and carbohydrates but low in nutrients include:  Trans fats (most processed foods list trans fats on the Nutrition Facts label).  Regular soda.  Juice.  Candy.  Sweets, such as cake, pie, doughnuts, and cookies.  Fried foods.  What foods can I eat? Eat nutrient-rich foods, which will nourish your body and keep you healthy. The food you should eat also will depend on several factors, including:  The calories you need.  The medicines you take.  Your weight.  Your blood glucose level.  Your blood pressure level.  Your cholesterol level.  You should eat a variety of foods, including:  Protein. ? Lean cuts of meat. ? Proteins low in saturated fats, such as fish, egg whites, and beans. Avoid processed meats.  Fruits and vegetables. ? Fruits  and vegetables that may help control blood glucose levels, such as apples, mangoes, and yams.  Dairy products. ? Choose fat-free or low-fat dairy products, such as milk, yogurt, and cheese.  Grains, bread, pasta, and rice. ? Choose whole grain products, such as multigrain bread, whole oats, and brown rice. These foods may help  control blood pressure.  Fats. ? Foods containing healthful fats, such as nuts, avocado, olive oil, canola oil, and fish.  Does everyone with diabetes mellitus have the same meal plan? Because every person with diabetes mellitus is different, there is not one meal plan that works for everyone. It is very important that you meet with a dietitian who will help you create a meal plan that is just right for you. This information is not intended to replace advice given to you by your health care provider. Make sure you discuss any questions you have with your health care provider. Document Released: 02/07/2005 Document Revised: 10/19/2015 Document Reviewed: 04/09/2013 Elsevier Interactive Patient Education  2017 [employer].

## 2017-01-30 LAB — MICROALBUMIN / CREATININE URINE RATIO
CREATININE, URINE: 189 mg/dL (ref 20–370)
Microalb Creat Ratio: 45 mcg/mg creat — ABNORMAL HIGH (ref <30)
Microalb, Ur: 8.5 mg/dL

## 2017-02-05 LAB — POCT GLYCOSYLATED HEMOGLOBIN (HGB A1C): Hemoglobin A1C: 8

## 2017-02-24 ENCOUNTER — Telehealth: Payer: Self-pay

## 2017-02-24 MED ORDER — METFORMIN HCL 500 MG PO TABS: 500.0000 mg | ORAL_TABLET | Freq: Two times a day (BID) | ORAL | 3 refills | Status: DC

## 2017-02-25 ENCOUNTER — Other Ambulatory Visit: Payer: Self-pay

## 2017-02-28 ENCOUNTER — Other Ambulatory Visit: Payer: Self-pay

## 2017-03-04 ENCOUNTER — Other Ambulatory Visit: Payer: Self-pay

## 2017-04-21 ENCOUNTER — Encounter: Payer: Self-pay | Admitting: Family Medicine

## 2017-04-21 ENCOUNTER — Ambulatory Visit (INDEPENDENT_AMBULATORY_CARE_PROVIDER_SITE_OTHER): Payer: Self-pay | Admitting: Family Medicine

## 2017-04-21 VITALS — BP 110/64 | HR 76 | Temp 97.7°F | Resp 14 | Ht 75.0 in | Wt 234.0 lb

## 2017-04-21 DIAGNOSIS — R059 Cough, unspecified: Secondary | ICD-10-CM

## 2017-04-21 DIAGNOSIS — R05 Cough: Secondary | ICD-10-CM

## 2017-04-21 DIAGNOSIS — J019 Acute sinusitis, unspecified: Secondary | ICD-10-CM

## 2017-04-21 MED ORDER — AZITHROMYCIN 250 MG PO TABS: ORAL_TABLET | ORAL | 0 refills | Status: DC

## 2017-04-21 MED ORDER — PROMETHAZINE-CODEINE 6.25-10 MG/5ML PO SYRP: 7.5000 mL | ORAL_SOLUTION | Freq: Four times a day (QID) | ORAL | 0 refills | Status: DC | PRN

## 2017-04-21 NOTE — Patient Instructions (Signed)

## 2017-04-21 NOTE — Progress Notes (Signed)
Patient ID: Kirk [name], male    DOB: 1955-09-10, 61 y.o.   MRN: [id number]  PCP: Kirk Jun, FNP  Chief Complaint  Patient presents with  . Nasal Congestion  . Cough    Subjective:  HPI Kirk [name] is a 61 y.o. male presents for evaluation of upper respiratory infection. [name] reports a 1.5 week history of cough, nasal congestion, facial and ear pain. Cough is productive of yellow sputum, he complains of soreness of throat, and sensation of intermittent ear pain. He has attempted relief with over the counter OTC cough medications without relief of symptoms. He denies associated fever, body aches, wheezing or shortness of breath. He has not history of pneumonia or asthma. Social History   Socioeconomic History  . Marital status: Divorced    Spouse name: Not on file  . Number of children: 1  . Years of education: Not on file  . Highest education level: Not on file  Social Needs  . Financial resource strain: Not on file  . Food insecurity - worry: Not on file  . Food insecurity - inability: Not on file  . Transportation needs - medical: Not on file  . Transportation needs - non-medical: Not on file  Occupational History  . Occupation: Acupuncturist  Tobacco Use  . Smoking status: Former Smoker    Packs/day: 2.00    Years: 35.00    Pack years: 70.00    Types: Cigarettes    Last attempt to quit: 02/19/2003    Years since quitting: 14.1  . Smokeless tobacco: Never Used  Substance and Sexual Activity  . Alcohol use: Yes    Alcohol/week: 0.0 oz    Comment: 09/04/2015 "nothing since 1980s"  . 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 Sexual activity: Not on file  Other Topics Concern  . Not on file  Social History Narrative  . Not on file    Family History  Problem Relation Age of Onset  . Lymphoma Mother   . Cancer - Prostate Father   . CAD Neg Hx      Review of Systems  Constitutional: Positive for fatigue.  Negative for fever.  HENT: Positive for congestion and ear pain.        Facial tenderness   Respiratory: Positive for cough. Negative for chest tightness, shortness of breath and wheezing.   Cardiovascular: Negative.   Neurological: Negative.     Patient Active Problem List   Diagnosis Date Noted  . NSTEMI (non-ST elevated myocardial infarction) ([place]) 09/04/2015  . PVD (peripheral vascular disease) ([place]) 06/08/2015  . DDD (degenerative disc disease), cervical 06/08/2015  . Hyperlipidemia 06/08/2015  . Diabetes mellitus with nephropathy ([place]) 06/08/2015  . Diabetes mellitus with peripheral vascular disease ([place]) 06/08/2015  . Annual physical exam 01/03/2015  . PAD (peripheral artery disease) ([place]) 02/18/2013  . Diabetes ([place]) 02/18/2013  . HTN (hypertension) 02/18/2013    No Known Allergies  Prior to Admission medications   Medication Sig Start Date End Date Taking? Authorizing Provider  aspirin 81 MG tablet Take 81 mg by mouth daily.   Yes [provider]  carvedilol (COREG) 6.25 MG tablet take 1 tablet by mouth twice a day with meals 10/16/16  Yes Kirk Dopp T, PA-C  clopidogrel (PLAVIX) 75 MG tablet take 1 tablet by mouth once daily  [place] 06/03/16  Yes Kirk Blanks, MD  clotrimazole-betamethasone (LOTRISONE) cream Apply 1 application  topically 2 (two) times daily. 10/01/16  Yes Kirk Jun, FNP  glimepiride (AMARYL) 4 MG tablet Take 1 tablet (4 mg total) by mouth daily. 11/13/16  Yes Kirk Jun, FNP  lisinopril (PRINIVIL,ZESTRIL) 10 MG tablet Take 1 tablet (10 mg total) by mouth daily. 10/01/16  Yes Kirk Jun, FNP  metFORMIN (GLUCOPHAGE) 500 MG tablet Take 1 tablet (500 mg total) by mouth 2 (two) times daily with a meal. 02/24/17  Yes Kirk Jun, FNP  nitroGLYCERIN (NITROSTAT) 0.4 MG SL tablet Place 1 tablet (0.4 mg total) under the tongue every 5 (five) minutes x 3 doses as needed for chest pain. 09/06/15  Yes Strader, Birney, PA-C  rosuvastatin (CRESTOR) 40 MG tablet take 1 tablet by mouth once daily 12/26/16  Yes Kirk Shi, PA-C    Past Medical, Surgical Family and Social History reviewed and updated.    Objective:   Today's Vitals   04/21/17 0806  BP: 110/64  Pulse: 76  Resp: 14  Temp: 97.7 F (36.5 C)  TempSrc: Oral  SpO2: 98%  Weight: 234 lb (106.1 kg)  Height: 6\' 3"  (1.905 m)    Wt Readings from Last 3 Encounters:  04/21/17 234 lb (106.1 kg)  01/28/17 224 lb (101.6 kg)  10/01/16 226 lb (102.5 kg)    Physical Exam  Constitutional: He is oriented to person, place, and time. He appears well-developed and well-nourished.  HENT:  Head: Normocephalic and atraumatic.  Right Ear: External ear normal.  Nose: Mucosal edema and rhinorrhea present.  Eyes: Conjunctivae and EOM are normal. Pupils are equal, round, and reactive to light.  Neck: No thyromegaly present.  Cardiovascular: Normal rate, regular rhythm, normal heart sounds and intact distal pulses.  Pulmonary/Chest: Effort normal and breath sounds normal. No respiratory distress. He has no wheezes. He exhibits no tenderness.  Musculoskeletal: Normal range of motion.  Lymphadenopathy:    He has no cervical adenopathy.  Neurological: He is alert and oriented to person, place, and time.  Skin: Skin is warm.  Psychiatric: He has a normal mood and affect. His behavior is normal. Judgment and thought content normal.   Assessment & Plan:  1. Acute non-recurrent sinusitis, unspecified location, uncomplicated -Start Azithromycin Take 2 tabs x 1 dose, then 1 tab every day for x 4 days   2. Cough, likely secondary to post nasal drip. Will treat with antitussive medication. -Start Phenergan w/codeine 7.5 ml every 6 hours as needed. Counseled to avoid driving or operating machinery while taking medication.  Meds ordered this encounter  Medications  . azithromycin (ZITHROMAX) 250 MG tablet    Sig: Take 2 tabs PO x 1 dose, then 1 tab PO QD x  4 days    Dispense:  6 tablet    Refill:  0    Order Specific Question:   Supervising Provider    Answer:   Kirk [name]  . promethazine-codeine (PHENERGAN WITH CODEINE) 6.25-10 MG/5ML syrup    Sig: Take 7.5 mLs by mouth every 6 (six) hours as needed for cough.    Dispense:  120 mL    Refill:  0    Order Specific Question:   Supervising Provider    Answer:   Kirk [name] [[id number]]    Advised to return if symptoms worsen or do not improve.  Carroll Sage. Kenton Kingfisher, MSN, FNP-C [facility]  [address] [place], [place] [address] [phone]

## 2017-04-25 ENCOUNTER — Telehealth: Payer: Self-pay

## 2017-04-25 NOTE — Telephone Encounter (Signed)
Patient was advise to continue to let the Azithromycin work and do a antihistamine at night for congestion

## 2017-04-29 ENCOUNTER — Ambulatory Visit: Payer: Self-pay | Admitting: Family Medicine

## 2017-06-07 ENCOUNTER — Other Ambulatory Visit: Payer: Self-pay | Admitting: Family Medicine

## 2017-06-30 ENCOUNTER — Other Ambulatory Visit: Payer: Self-pay | Admitting: Cardiovascular Disease

## 2017-06-30 ENCOUNTER — Encounter: Payer: Self-pay | Admitting: Family Medicine

## 2017-06-30 ENCOUNTER — Ambulatory Visit: Payer: BLUE CROSS/BLUE SHIELD | Admitting: Family Medicine

## 2017-06-30 VITALS — BP 126/68 | HR 80 | Temp 98.8°F | Resp 16 | Ht 75.0 in | Wt 232.0 lb

## 2017-06-30 DIAGNOSIS — I517 Cardiomegaly: Secondary | ICD-10-CM

## 2017-06-30 DIAGNOSIS — I2510 Atherosclerotic heart disease of native coronary artery without angina pectoris: Secondary | ICD-10-CM

## 2017-06-30 DIAGNOSIS — E119 Type 2 diabetes mellitus without complications: Secondary | ICD-10-CM | POA: Diagnosis not present

## 2017-06-30 DIAGNOSIS — I10 Essential (primary) hypertension: Secondary | ICD-10-CM

## 2017-06-30 LAB — POCT URINALYSIS DIP (DEVICE)
Bilirubin Urine: NEGATIVE
Glucose, UA: 250 mg/dL — AB
HGB URINE DIPSTICK: NEGATIVE
KETONES UR: NEGATIVE mg/dL
LEUKOCYTES UA: NEGATIVE
Nitrite: NEGATIVE
Protein, ur: 30 mg/dL — AB
Urobilinogen, UA: 1 mg/dL (ref 0.0–1.0)
pH: 5.5 (ref 5.0–8.0)

## 2017-06-30 LAB — POCT GLYCOSYLATED HEMOGLOBIN (HGB A1C): Hemoglobin A1C: 8.3

## 2017-06-30 MED ORDER — METFORMIN HCL 1000 MG PO TABS: 1000.0000 mg | ORAL_TABLET | Freq: Two times a day (BID) | ORAL | 2 refills | Status: DC

## 2017-06-30 MED ORDER — CARVEDILOL 6.25 MG PO TABS: 6.2500 mg | ORAL_TABLET | Freq: Two times a day (BID) | ORAL | 0 refills | Status: DC

## 2017-06-30 MED ORDER — CLOPIDOGREL BISULFATE 75 MG PO TABS: ORAL_TABLET | ORAL | 0 refills | Status: DC

## 2017-06-30 NOTE — Progress Notes (Signed)
Patient ID: Kirk [name], male    DOB: 18-Sep-1955, 62 y.o.   MRN: [id number]  PCP: Scot Jun, FNP  Chief Complaint  Patient presents with  . Follow-up    3 month on diabetes   Subjective:  HPIMalcolm J [name] is a 62 y.o. male with a history of hyperlipidemia, uncontrolled type 2 diabetes, hypertension, presents for 73-month follow-up of uncontrolled diabetes.  Kirk [name] reports no home monitoring of blood glucose. During his last office visit his A1c had accelerated to 8.0 as he was not taking prescribed antidiabetic medications. During last visit he committed to resuming medication for diabetes treatment and adopting a diabetic friendly diet.  Today he reports that he has not adopted a diabetic friendly diet and has been consuming an excess amount of sodas or non-diet.  He is also been nonrestrictive with eating.  He endorses that he has adopted physical activity walking for at least 15-30 minutes 3 times per week.  He denies any shortness of breath, chest pain, weakness with physical activity. Current Body mass index is 29 kg/m.  Reports that he is consistently taking his medications now and tolerating without adverse side effects.  He is overdue for diabetic eye exam and would like a referral in order to have this screening completed. Social History   Socioeconomic History  . Marital status: Divorced    Spouse name: None  . Number of children: 1  . Years of education: None  . Highest education level: None  Social Needs  . Financial resource strain: None  . Food insecurity - worry: None  . Food insecurity - inability: None  . Transportation needs - medical: None  . Transportation needs - non-medical: None  Occupational History  . Occupation: Acupuncturist  Tobacco Use  . Smoking status: Former Smoker    Packs/day: 2.00    Years: 35.00    Pack years: 70.00    Types: Cigarettes    Last attempt to quit: 02/19/2003    Years since quitting: 14.3  . Smokeless tobacco: Never  Used  Substance and Sexual Activity  . Alcohol use: Yes    Alcohol/week: 0.0 oz    Comment: 09/04/2015 "nothing since 1980s"  . 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 Sexual activity: None  Other Topics Concern  . None  Social History Narrative  . None     Family History  Problem Relation Age of Onset  . Lymphoma Mother   . Cancer - Prostate Father   . CAD Neg Hx    Review of Systems  Constitutional: Negative.   HENT: Negative.   Respiratory: Negative.   Endocrine: Negative.   Genitourinary: Negative.   Musculoskeletal: Negative.   Neurological: Negative.   Hematological: Negative.   Psychiatric/Behavioral: Negative.     Patient Active Problem List   Diagnosis Date Noted  . NSTEMI (non-ST elevated myocardial infarction) ([place]) 09/04/2015  . PVD (peripheral vascular disease) ([place]) 06/08/2015  . DDD (degenerative disc disease), cervical 06/08/2015  . Hyperlipidemia 06/08/2015  . Diabetes mellitus with nephropathy ([place]) 06/08/2015  . Diabetes mellitus with peripheral vascular disease ([place]) 06/08/2015  . Annual physical exam 01/03/2015  . PAD (peripheral artery disease) ([place]) 02/18/2013  . Diabetes ([place]) 02/18/2013  . HTN (hypertension) 02/18/2013    No Known Allergies  Prior to Admission medications   Medication Sig Start Date End Date Taking? Authorizing Provider  aspirin 81 MG tablet Take 81 mg by  mouth daily.   Yes [provider]  azithromycin (ZITHROMAX) 250 MG tablet Take 2 tabs PO x 1 dose, then 1 tab PO QD x 4 days 04/21/17  Yes Scot Jun, FNP  carvedilol (COREG) 6.25 MG tablet take 1 tablet by mouth twice a day with meals 10/16/16  Yes Richardson Dopp T, PA-C  clopidogrel (PLAVIX) 75 MG tablet take 1 tablet by mouth once daily  STOP BRILINTA 06/03/16  Yes Burnell Blanks, MD  lisinopril (PRINIVIL,ZESTRIL) 10 MG tablet Take 1 tablet (10 mg total) by mouth daily. 10/01/16  Yes Scot Jun, FNP   metFORMIN (GLUCOPHAGE) 500 MG tablet Take 1 tablet (500 mg total) by mouth 2 (two) times daily with a meal. 02/24/17  Yes Scot Jun, FNP  nitroGLYCERIN (NITROSTAT) 0.4 MG SL tablet Place 1 tablet (0.4 mg total) under the tongue every 5 (five) minutes x 3 doses as needed for chest pain. 09/06/15  Yes Strader, [place], PA-C  rosuvastatin (CRESTOR) 40 MG tablet take 1 tablet by mouth once daily 12/26/16  Yes Weaver, Scott T, PA-C  clotrimazole-betamethasone (LOTRISONE) cream Apply 1 application topically 2 (two) times daily. Patient not taking: Reported on 06/30/2017 10/01/16   Scot Jun, FNP  glimepiride (AMARYL) 4 MG tablet take 1 tablet once daily 06/09/17   Scot Jun, FNP  promethazine-codeine [facility] WITH CODEINE) 6.25-10 MG/5ML syrup Take 7.5 mLs by mouth every 6 (six) hours as needed for cough. 04/21/17   Scot Jun, FNP    Past Medical, Surgical Family and Social History reviewed and updated.    Objective:   Today's Vitals   06/30/17 0835  BP: 126/68  Pulse: 80  Resp: 16  Temp: 98.8 F (37.1 C)  TempSrc: Oral  SpO2: 99%  Weight: 232 lb (105.2 kg)  Height: 6\' 3"  (1.905 m)    Wt Readings from Last 3 Encounters:  06/30/17 232 lb (105.2 kg)  04/21/17 234 lb (106.1 kg)  01/28/17 224 lb (101.6 kg)    Physical Exam  Constitutional: He is oriented to person, place, and time. He appears well-developed and well-nourished.  HENT:  Head: Normocephalic and atraumatic.  Right Ear: External ear normal.  Left Ear: External ear normal.  Nose: Nose normal.  Mouth/Throat: Oropharynx is clear and moist.  Eyes: Conjunctivae and EOM are normal. Pupils are equal, round, and reactive to light.  Neck: Normal range of motion. Neck supple.  Cardiovascular: Normal rate, normal heart sounds and intact distal pulses.  Pulmonary/Chest: Effort normal and breath sounds normal.  Abdominal: Soft. Bowel sounds are normal.  Musculoskeletal: Normal range of motion.   Neurological: He is alert and oriented to person, place, and time.  Skin: Skin is warm and dry.  Psychiatric: He has a normal mood and affect. His behavior is normal. Judgment and thought content normal.   Assessment & Plan:  1. Type 2 diabetes mellitus without complication, without long-term current use of insulin ([place]), uncontrolled, unimproved, A1c is increased to 8.3.  We reviewed the effects of dietary choices and habits and worsening diabetes.  Patient verbalized that he will attempt to discontinue intake of sodas and replace with unsweetened beverage options and/or water.  I am increasing metformin to 1000 mg twice daily to improve A1c.  He will continue glimepiride 4 mg with breakfast daily.  Patient provided a 1800 consistent carbohydrate diet meal plan to assist in facilitating better food choices.  Meds ordered this encounter  Medications  . metFORMIN (GLUCOPHAGE) 1000 MG  tablet    Sig: Take 1 tablet (1,000 mg total) by mouth 2 (two) times daily with a meal.    Dispense:  180 tablet    Refill:  2    Order Specific Question:   Supervising Provider    Answer:   Tresa Garter [id number]    Orders Placed This Encounter  Procedures  . Ambulatory referral to Ophthalmology  . POCT glycosylated hemoglobin (Hb A1C)  . POCT urinalysis dip (device)     Return for care in 3 months chronic conditions, goals for A1c to be less than 8 next follow-up.   Carroll Sage. Kenton Kingfisher, MSN, FNP-C [facility]  [address] [place], [place] [address] [phone]

## 2017-06-30 NOTE — Patient Instructions (Signed)
I have placed a referral for you to obtain a diabetic eye exam. The office will call you within 7-10 day to schedule an appointment.  I have increased your metformin 1000 mg twice daily with meals. Continue physical activity.    I am providing you with a sample meal plan. Make efforts to replace sodas with water.

## 2017-07-07 ENCOUNTER — Encounter: Payer: Self-pay | Admitting: Cardiovascular Disease

## 2017-07-07 ENCOUNTER — Encounter (INDEPENDENT_AMBULATORY_CARE_PROVIDER_SITE_OTHER): Payer: Self-pay

## 2017-07-07 ENCOUNTER — Ambulatory Visit (INDEPENDENT_AMBULATORY_CARE_PROVIDER_SITE_OTHER): Payer: BLUE CROSS/BLUE SHIELD | Admitting: Cardiovascular Disease

## 2017-07-07 VITALS — BP 136/70 | HR 74 | Ht 75.0 in | Wt 234.0 lb

## 2017-07-07 DIAGNOSIS — I10 Essential (primary) hypertension: Secondary | ICD-10-CM | POA: Diagnosis not present

## 2017-07-07 DIAGNOSIS — E7800 Pure hypercholesterolemia, unspecified: Secondary | ICD-10-CM | POA: Diagnosis not present

## 2017-07-07 DIAGNOSIS — I2510 Atherosclerotic heart disease of native coronary artery without angina pectoris: Secondary | ICD-10-CM | POA: Diagnosis not present

## 2017-07-07 DIAGNOSIS — I739 Peripheral vascular disease, unspecified: Secondary | ICD-10-CM

## 2017-07-07 DIAGNOSIS — I517 Cardiomegaly: Secondary | ICD-10-CM

## 2017-07-07 NOTE — Patient Instructions (Signed)
Medication Instructions:  Your physician recommends that you continue on your current medications as directed. Please refer to the Current Medication list given to you today.   Labwork: none  Testing/Procedures: Your physician has requested that you have an ankle brachial index (ABI). During this test an ultrasound and blood pressure cuff are used to evaluate the arteries that supply the arms and legs with blood. Allow thirty minutes for this exam. There are no restrictions or special instructions.    Follow-Up: Your physician recommends that you schedule a follow-up appointment in: 12 months. Please call our office in about 9 months to schedule this appointment    Any Other Special Instructions Will Be Listed Below (If Applicable).     If you need a refill on your cardiac medications before your next appointment, please call your pharmacy.

## 2017-07-07 NOTE — Progress Notes (Signed)
Chief Complaint  Patient presents with  . Coronary Artery Disease   History of Present Illness: 62 yo male with history of PAD, HLD, HTN, DM who is here today for follow up. I met him in 2014. He has a history of PAD s/p bilateral lower extremity stenting in 2003. He had his first cardiac event in April 2017 when he was admitted to [facility] with a NSTEMI. Cardiac cath 09/05/15 per Dr. Claiborne Billings with high grade mid LAD stenosis treated with a DES x 1. Mild to moderate disease in the Circumflex and RCA. Echo April 2017 with normal LV systolic function. He is a former smoker.   He is here today for follow up. The patient denies any chest pain, dyspnea, palpitations, lower extremity edema, orthopnea, PND, dizziness, near syncope or syncope. He is walking his dog every day. His legs are hurting more when walking. This resolves with rest. No ulcerations.   Primary Care Physician: Scot Jun, FNP  Past Medical History:  Diagnosis Date  . Arthritis    "right knee" (09/04/2015)  . HTN (hypertension)   . Hyperlipidemia   . NSTEMI (non-ST elevated myocardial infarction) ([place]) 09/04/2015   a. cath 09/05/2015: 95% stenosis mid-LAD, 55% mid-RCA, and 40% prox-RCA. PCI performed w/ Synergy DES to LAD  . PAD (peripheral artery disease) ([facility])    Stenting of bilateral iliacs in 2003.  [name] Refusal of blood transfusions as patient is Jehovah's Witness   . Type II diabetes mellitus ([place])     Past Surgical History:  Procedure Laterality Date  . CARDIAC CATHETERIZATION N/A 09/05/2015   Procedure: Left Heart Cath and Coronary Angiography;  Surgeon: Troy Sine, MD;  Location: [place];  Service: Cardiovascular;  Laterality: N/A;  . CARDIAC CATHETERIZATION N/A 09/05/2015   Procedure: Coronary Stent Intervention;  Surgeon: Troy Sine, MD;  Location: [place] [facility];  Service: Cardiovascular;  Laterality: N/A;  . 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 PERIPHERAL  VASCULAR CATHETERIZATION Bilateral 2003   "stenting"  . TONSILLECTOMY  ~ 1963    Current Outpatient Medications  Medication Sig Dispense Refill  . aspirin 81 MG tablet Take 81 mg by mouth daily.    . carvedilol (COREG) 6.25 MG tablet Take 1 tablet (6.25 mg total) by mouth 2 (two) times daily with a meal. Please keep upcoming appt for future refills. Thank you 60 tablet 0  . clopidogrel (PLAVIX) 75 MG tablet take 1 tablet by mouth once daily. Please keep upcoming appt. Thank you 30 tablet 0  . clotrimazole-betamethasone (LOTRISONE) cream Apply 1 application topically 2 (two) times daily. 90 g 0  . glimepiride (AMARYL) 4 MG tablet take 1 tablet once daily 90 tablet 1  . lisinopril (PRINIVIL,ZESTRIL) 10 MG tablet Take 1 tablet (10 mg total) by mouth daily. 90 tablet 1  . metFORMIN (GLUCOPHAGE) 1000 MG tablet Take 1 tablet (1,000 mg total) by mouth 2 (two) times daily with a meal. 180 tablet 2  . nitroGLYCERIN (NITROSTAT) 0.4 MG SL tablet Place 1 tablet (0.4 mg total) under the tongue every 5 (five) minutes x 3 doses as needed for chest pain. 25 tablet 2  . promethazine-codeine (PHENERGAN WITH CODEINE) 6.25-10 MG/5ML syrup Take 7.5 mLs by mouth every 6 (six) hours as needed for cough. 120 mL 0  . rosuvastatin (CRESTOR) 40 MG tablet take 1 tablet by mouth once daily 30 tablet 11   No current facility-administered  medications for this visit.     No Known Allergies  Social History   Socioeconomic History  . Marital status: Divorced    Spouse name: Not on file  . Number of children: 1  . Years of education: Not on file  . Highest education level: Not on file  Social Needs  . Financial resource strain: Not on file  . Food insecurity - worry: Not on file  . Food insecurity - inability: Not on file  . Transportation needs - medical: Not on file  . Transportation needs - non-medical: Not on file  Occupational History  . Occupation: Acupuncturist  Tobacco Use  . Smoking status: Former  Smoker    Packs/day: 2.00    Years: 35.00    Pack years: 70.00    Types: Cigarettes    Last attempt to quit: 02/19/2003    Years since quitting: 14.3  . Smokeless tobacco: Never Used  Substance and Sexual Activity  . Alcohol use: Yes    Alcohol/week: 0.0 oz    Comment: 09/04/2015 "nothing since 1980s"  . 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 Sexual activity: Not on file  Other Topics Concern  . Not on file  Social History Narrative  . Not on file    Family History  Problem Relation Age of Onset  . Lymphoma Mother   . Cancer - Prostate Father   . CAD Neg Hx     Review of Systems:  As stated in the HPI and otherwise negative.   BP 136/70   Pulse 74   Ht 6' 3" (1.905 m)   Wt 234 lb (106.1 kg)   SpO2 95%   BMI 29.25 kg/m   Physical Examination:  General: Well developed, well nourished, NAD  HEENT: OP clear, mucus membranes moist  SKIN: warm, dry. No rashes. Neuro: No focal deficits  Musculoskeletal: Muscle strength 5/5 all ext  Psychiatric: Mood and affect normal  Neck: No JVD, no carotid bruits, no thyromegaly, no lymphadenopathy.  Lungs:Clear bilaterally, no wheezes, rhonci, crackles Cardiovascular: Regular rate and rhythm. No murmurs, gallops or rubs. Abdomen:Soft. Bowel sounds present. Non-tender.  Extremities: No lower extremity edema. Pulses are not palpable in the bilateral DP/PT.  Echo 09/06/15: Left ventricle: The cavity size was normal. There was moderate   focal basal hypertrophy of the septum. Systolic function was   normal. The estimated ejection fraction was in the range of 60%   to 65%. Wall motion was normal; there were no regional wall   motion abnormalities. Doppler parameters are consistent with   abnormal left ventricular relaxation (grade 1 diastolic   dysfunction). The E/e&' ratio is between 8-15, suggesting   indeterminate LV filling pressure. - Aortic valve: Trileaflet. Cusp separation was normal.  There was   no stenosis. Subvalvular LVOT acceleration is noted. There was no   regurgitation. - Mitral valve: SAM. Mildly thickened leaflets . There was trivial   regurgitation. - Left atrium: The atrium was normal in size. - Inferior vena cava: The vessel was normal in size. The   respirophasic diameter changes were in the normal range (>= 50%),   consistent with normal central venous pressure.  EKG:  EKG is ordered today. The ekg ordered today demonstrates Sinus, rate 74 bpm. 1st degree AV block.   Recent Labs: 10/01/2016: ALT 25; BUN 19; Creat 1.06; Hemoglobin 13.1; Platelets 268; Potassium 4.8; Sodium 140   Lipid Panel  Component Value Date/Time   CHOL 117 (L) 03/12/2016 0837   CHOL 164 06/08/2015 0840   TRIG 86 03/12/2016 0837   HDL 32 (L) 03/12/2016 0837   HDL 35 (L) 06/08/2015 0840   CHOLHDL 3.7 03/12/2016 0837   VLDL 17 03/12/2016 0837   LDLCALC 68 03/12/2016 0837   LDLCALC 101 (H) 06/08/2015 0840     Wt Readings from Last 3 Encounters:  07/07/17 234 lb (106.1 kg)  06/30/17 232 lb (105.2 kg)  04/21/17 234 lb (106.1 kg)     Other studies Reviewed: Additional studies/ records that were reviewed today include: . Review of the above records demonstrates:   Assessment and Plan:   1. CAD without angina: No chest pain suggestive of angina. NSTEMI April 2017 with severe stenosis mid LAD treated with DES x 1. Will continue ASA, Plavix, beta blocker and statin.    2. HTN: BP is well controlled. No changes today.   3. HLD: Lipids followed in primary care. Continue statin. He will ask primary care to repeat lipids this year.   4. LVH: Subvalvular LVOT acceleration with SAM on echo. No dizziness or syncope. Will continue beta blocker.   5. PAD: He is having pain in both legs after walking 200-300 yards. No rest pain. No ulcerations. ABI cancelled by pt last year. Will repeat now.   Current medicines are reviewed at length with the patient today.  The patient does not  have concerns regarding medicines.  The following changes have been made:  no change  Labs/ tests ordered today include:   Orders Placed This Encounter  Procedures  . EKG 12-Lead     Disposition:   FU with me in 12 months   Signed, Lauree Chandler, MD 07/07/2017 4:13 PM    [facility] [place], [place], [place]  [address] Phone: [phone]; Fax: [phone]

## 2017-07-11 LAB — HM DIABETES EYE EXAM

## 2017-07-17 NOTE — Telephone Encounter (Signed)
Medication refill

## 2017-07-28 ENCOUNTER — Encounter: Payer: Self-pay | Admitting: Family Medicine

## 2017-07-28 ENCOUNTER — Telehealth: Payer: Self-pay

## 2017-07-28 ENCOUNTER — Ambulatory Visit (INDEPENDENT_AMBULATORY_CARE_PROVIDER_SITE_OTHER): Payer: BLUE CROSS/BLUE SHIELD | Admitting: Family Medicine

## 2017-07-28 ENCOUNTER — Ambulatory Visit (HOSPITAL_COMMUNITY)
Admission: RE | Admit: 2017-07-28 | Discharge: 2017-07-28 | Disposition: A | Discharge status: Home / Self Care | Payer: BLUE CROSS/BLUE SHIELD | Source: Ambulatory Visit | Attending: Family Medicine | Admitting: Family Medicine

## 2017-07-28 VITALS — BP 124/64 | HR 68 | Temp 98.4°F | Ht 75.0 in | Wt 238.0 lb

## 2017-07-28 DIAGNOSIS — R109 Unspecified abdominal pain: Secondary | ICD-10-CM

## 2017-07-28 DIAGNOSIS — K5900 Constipation, unspecified: Secondary | ICD-10-CM | POA: Diagnosis not present

## 2017-07-28 LAB — POCT URINALYSIS DIP (DEVICE)
Bilirubin Urine: NEGATIVE
Glucose, UA: 100 mg/dL — AB
HGB URINE DIPSTICK: NEGATIVE
Ketones, ur: NEGATIVE mg/dL
Leukocytes, UA: NEGATIVE
NITRITE: NEGATIVE
PROTEIN: NEGATIVE mg/dL
SPECIFIC GRAVITY, URINE: 1.01 (ref 1.005–1.030)
UROBILINOGEN UA: 0.2 mg/dL (ref 0.0–1.0)
pH: 5.5 (ref 5.0–8.0)

## 2017-07-28 MED ORDER — CETIRIZINE HCL 10 MG PO TABS: 10.0000 mg | ORAL_TABLET | Freq: Every day | ORAL | 11 refills | Status: DC

## 2017-07-28 MED ORDER — LACTULOSE 10 GM/15ML PO SOLN: 20.0000 g | Freq: Two times a day (BID) | ORAL | 0 refills | Status: DC | PRN

## 2017-07-28 MED ORDER — IPRATROPIUM BROMIDE 0.03 % NA SOLN: 2.0000 | Freq: Two times a day (BID) | NASAL | 0 refills | Status: DC

## 2017-07-28 NOTE — Progress Notes (Signed)
Patient ID: JD MCCASTER, male    DOB: 09-22-1955, 62 y.o.   MRN: [id number]  PCP: Scot Jun, FNP  Chief Complaint  Patient presents with  . Flank Pain    Right side 2 weeks when he lays down    Subjective:  HPI  Kirk [name] is a 62 y.o. male with type 2 diabetes, hypertension, chronic anticoagulation, PAD, PVD, NSTEMI, presents for evaluation right sided flank pain. Pain has been present for over 1 week. Pain is exacerbated by lying on his left side. The pain is characterized as sharp and radiating upward. Pain is relieved with standing or sitting up. Denies recent trauma or injury, abdominal pain, nausea, vomiting, chills, hematuria, or fever. No known history of renal stones. Social History   Socioeconomic History  . Marital status: Divorced    Spouse name: Not on file  . Number of children: 1  . Years of education: Not on file  . Highest education level: Not on file  Social Needs  . Financial resource strain: Not on file  . Food insecurity - worry: Not on file  . Food insecurity - inability: Not on file  . Transportation needs - medical: Not on file  . Transportation needs - non-medical: Not on file  Occupational History  . Occupation: Acupuncturist  Tobacco Use  . Smoking status: Former Smoker    Packs/day: 2.00    Years: 35.00    Pack years: 70.00    Types: Cigarettes    Last attempt to quit: 02/19/2003    Years since quitting: 14.4  . Smokeless tobacco: Never Used  Substance and Sexual Activity  . Alcohol use: Yes    Alcohol/week: 0.0 oz    Comment: 09/04/2015 "nothing since 1980s"  . 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 Sexual activity: Not on file  Other Topics Concern  . Not on file  Social History Narrative  . Not on file    Family History  Problem Relation Age of Onset  . Lymphoma Mother   . Cancer - Prostate Father   . CAD Neg Hx    Review of Systems  Respiratory: Negative.    Cardiovascular: Negative.   Gastrointestinal:       Right Flank pain   Neurological: Negative for headaches.  Psychiatric/Behavioral: Negative.     Patient Active Problem List   Diagnosis Date Noted  . NSTEMI (non-ST elevated myocardial infarction) ([place]) 09/04/2015  . PVD (peripheral vascular disease) ([place]) 06/08/2015  . DDD (degenerative disc disease), cervical 06/08/2015  . Hyperlipidemia 06/08/2015  . Diabetes mellitus with nephropathy ([place]) 06/08/2015  . Diabetes mellitus with peripheral vascular disease ([place]) 06/08/2015  . Annual physical exam 01/03/2015  . PAD (peripheral artery disease) ([place]) 02/18/2013  . Diabetes ([place]) 02/18/2013  . HTN (hypertension) 02/18/2013    No Known Allergies  Prior to Admission medications   Medication Sig Start Date End Date Taking? Authorizing Provider  aspirin 81 MG tablet Take 81 mg by mouth daily.   Yes [provider]  carvedilol (COREG) 6.25 MG tablet Take 1 tablet (6.25 mg total) by mouth 2 (two) times daily with a meal. Please keep upcoming appt for future refills. Thank you 06/30/17  Yes Burnell Blanks, MD  clopidogrel (PLAVIX) 75 MG tablet take 1 tablet by mouth once daily. Please keep upcoming appt. Thank you 06/30/17  Yes Burnell Blanks, MD  clotrimazole-betamethasone (LOTRISONE) cream Apply  1 application topically 2 (two) times daily. 10/01/16  Yes Scot Jun, FNP  glimepiride (AMARYL) 4 MG tablet take 1 tablet once daily 06/09/17  Yes Scot Jun, FNP  lisinopril (PRINIVIL,ZESTRIL) 10 MG tablet Take 1 tablet (10 mg total) by mouth daily. 10/01/16  Yes Scot Jun, FNP  metFORMIN (GLUCOPHAGE) 1000 MG tablet Take 1 tablet (1,000 mg total) by mouth 2 (two) times daily with a meal. 06/30/17  Yes Scot Jun, FNP  nitroGLYCERIN (NITROSTAT) 0.4 MG SL tablet Place 1 tablet (0.4 mg total) under the tongue every 5 (five) minutes x 3 doses as needed for chest pain. 09/06/15  Yes Strader, [place],  PA-C  rosuvastatin (CRESTOR) 40 MG tablet take 1 tablet by mouth once daily 12/26/16  Yes Weaver, Nicki Reaper T, PA-C  promethazine-codeine (PHENERGAN WITH CODEINE) 6.25-10 MG/5ML syrup Take 7.5 mLs by mouth every 6 (six) hours as needed for cough. Patient not taking: Reported on 07/28/2017 04/21/17   Scot Jun, FNP    Past Medical, Surgical Family and Social History reviewed and updated.    Objective:   Today's Vitals   07/28/17 1311  BP: 124/64  Pulse: 68  Temp: 98.4 F (36.9 C)  TempSrc: Oral  SpO2: 96%  Weight: 238 lb (108 kg)  Height: 6\' 3"  (1.905 m)    Wt Readings from Last 3 Encounters:  07/28/17 238 lb (108 kg)  07/07/17 234 lb (106.1 kg)  06/30/17 232 lb (105.2 kg)    Physical Exam  Constitutional: He is oriented to person, place, and time. He appears well-developed and well-nourished.  HENT:  Head: Normocephalic.  Neck: Normal range of motion.  Cardiovascular: Normal rate, normal heart sounds and intact distal pulses.  Pulmonary/Chest: Effort normal and breath sounds normal.  Abdominal: Soft. Normal appearance and bowel sounds are normal. There is no tenderness. There is CVA tenderness.  Neurological: He is alert and oriented to person, place, and time.  Skin: Skin is warm and dry.  Psychiatric: He has a normal mood and affect. His behavior is normal. Judgment and thought content normal.   Assessment & Plan:  1. Flank pain, renal stone vs muscle strain. Will obtain a KUB to rule out renal stone. Urine negative for hematuria. 2. Constipation, KUB revealed moderate stool burden in colon. Will trial lactulose x 2 doses. May repeat if needed to relieve stool burden.  Dg Abd 1 View  Result Date: 07/28/2017 CLINICAL DATA:  Right flank pain for over 1 week. EXAM: ABDOMEN - 1 VIEW COMPARISON:  None. FINDINGS: The bowel gas pattern is nonobstructive. Moderate to moderately large stool burden throughout the colon is noted. No radio-opaque calculi or other significant  radiographic abnormality is seen. Bilateral iliac stents are in place. The patient is status post fixation of a left hip fracture. IMPRESSION: No acute finding. Moderate to moderately large stool burden throughout the colon. Electronically Signed   By: Inge Rise M.D.   On: 07/28/2017 14:31    Orders Placed This Encounter  Procedures  . DG Abd 1 View  . POCT urinalysis dip (device)    Carroll Sage. Kenton Kingfisher, MSN, FNP-C [facility]  [address] [place], [place] [address] [phone]

## 2017-08-01 ENCOUNTER — Other Ambulatory Visit: Payer: Self-pay | Admitting: Cardiovascular Disease

## 2017-08-01 DIAGNOSIS — I739 Peripheral vascular disease, unspecified: Secondary | ICD-10-CM

## 2017-08-04 ENCOUNTER — Inpatient Hospital Stay (HOSPITAL_COMMUNITY)
Admission: RE | Admit: 2017-08-04 | Payer: BLUE CROSS/BLUE SHIELD | Source: Ambulatory Visit | Attending: Cardiovascular Disease | Admitting: Cardiovascular Disease

## 2017-08-06 ENCOUNTER — Ambulatory Visit (HOSPITAL_COMMUNITY)
Admission: RE | Admit: 2017-08-06 | Discharge: 2017-08-06 | Disposition: A | Discharge status: Home / Self Care | Payer: BLUE CROSS/BLUE SHIELD | Source: Ambulatory Visit | Attending: Cardiovascular Disease | Admitting: Cardiovascular Disease

## 2017-08-06 DIAGNOSIS — I739 Peripheral vascular disease, unspecified: Secondary | ICD-10-CM | POA: Insufficient documentation

## 2017-08-08 ENCOUNTER — Other Ambulatory Visit: Payer: Self-pay | Admitting: Cardiovascular Disease

## 2017-08-08 ENCOUNTER — Other Ambulatory Visit: Payer: Self-pay | Admitting: Family Medicine

## 2017-08-08 DIAGNOSIS — I10 Essential (primary) hypertension: Secondary | ICD-10-CM

## 2017-08-08 DIAGNOSIS — I517 Cardiomegaly: Secondary | ICD-10-CM

## 2017-08-08 DIAGNOSIS — I2510 Atherosclerotic heart disease of native coronary artery without angina pectoris: Secondary | ICD-10-CM

## 2017-08-25 ENCOUNTER — Telehealth: Payer: Self-pay | Admitting: Cardiovascular Disease

## 2017-08-25 NOTE — Telephone Encounter (Signed)
Pt c/o Shortness Of Breath: STAT if SOB developed within the last 24 hours or pt is noticeably SOB on the phone  1. Are you currently SOB (can you hear that pt is SOB on the phone)? no  2. How long have you been experiencing SOB? "A week or so"  3. Are you SOB when sitting or when up moving around? Moving around   4. Are you currently experiencing any other symptoms? No

## 2017-08-25 NOTE — Telephone Encounter (Signed)
Pat, Can we see if there is any way to get him in sooner to see an office APP? I am not in the office at all this week. Gerald Stabs

## 2017-08-25 NOTE — Telephone Encounter (Signed)
Returned call to patient. Patient states that he has been having SOB and chest discomfort in the center of his chest for the past week or so. Patient rates this dicomfort a 2/10. Patient states that this only occurs during really strenuous activity and is relieved with rest. Patient denies NTG use. Patient states that he walks on a regular basis and states that it does not happen then. Patient denies radiation, lightheadedness, dizziness, N/V, sweating, or any other symptoms. Made patient next available appointment with Ermalinda Barrios, PA on 4/25. Made patient aware that I would forward to Dr. Angelena Form and his RN for review. Instructed patient to let us know if his symptoms change or worsen before then. ER precautions reviewed. Patient verbalized understanding.

## 2017-08-25 NOTE — Telephone Encounter (Signed)
There were not any available APP appointments, so patient was scheduled for next available.   There was a cancellation on APP schedule at the [facility] tomorrow morning. Called patient to make him aware. Patient agreeable to be seen at the [facility] office with Leonia Reader, NP at 8:00 AM.

## 2017-08-26 ENCOUNTER — Encounter: Payer: Self-pay | Admitting: Adult Health

## 2017-08-26 ENCOUNTER — Ambulatory Visit: Payer: BLUE CROSS/BLUE SHIELD | Admitting: Adult Health

## 2017-08-26 VITALS — BP 132/64 | HR 71 | Ht 75.0 in | Wt 234.0 lb

## 2017-08-26 DIAGNOSIS — E7800 Pure hypercholesterolemia, unspecified: Secondary | ICD-10-CM | POA: Diagnosis not present

## 2017-08-26 DIAGNOSIS — Z79899 Other long term (current) drug therapy: Secondary | ICD-10-CM | POA: Diagnosis not present

## 2017-08-26 DIAGNOSIS — R0600 Dyspnea, unspecified: Secondary | ICD-10-CM

## 2017-08-26 DIAGNOSIS — I10 Essential (primary) hypertension: Secondary | ICD-10-CM | POA: Diagnosis not present

## 2017-08-26 DIAGNOSIS — I214 Non-ST elevation (NSTEMI) myocardial infarction: Secondary | ICD-10-CM | POA: Diagnosis not present

## 2017-08-26 DIAGNOSIS — R0609 Other forms of dyspnea: Secondary | ICD-10-CM | POA: Diagnosis not present

## 2017-08-26 DIAGNOSIS — I2510 Atherosclerotic heart disease of native coronary artery without angina pectoris: Secondary | ICD-10-CM | POA: Diagnosis not present

## 2017-08-26 LAB — HEPATIC FUNCTION PANEL
ALBUMIN: 4.5 g/dL (ref 3.6–4.8)
ALK PHOS: 62 IU/L (ref 39–117)
ALT: 20 IU/L (ref 0–44)
AST: 18 IU/L (ref 0–40)
BILIRUBIN, DIRECT: 0.13 mg/dL (ref 0.00–0.40)
Bilirubin Total: 0.3 mg/dL (ref 0.0–1.2)
Total Protein: 7.3 g/dL (ref 6.0–8.5)

## 2017-08-26 LAB — LIPID PANEL
CHOL/HDL RATIO: 3.3 ratio (ref 0.0–5.0)
Cholesterol, Total: 102 mg/dL (ref 100–199)
HDL: 31 mg/dL — ABNORMAL LOW (ref >39)
LDL Calculated: 54 mg/dL (ref 0–99)
TRIGLYCERIDES: 83 mg/dL (ref 0–149)
VLDL CHOLESTEROL CAL: 17 mg/dL (ref 5–40)

## 2017-08-26 MED ORDER — NITROGLYCERIN 0.4 MG SL SUBL: 0.4000 mg | SUBLINGUAL_TABLET | SUBLINGUAL | 2 refills | Status: DC | PRN

## 2017-08-26 NOTE — Patient Instructions (Signed)
If you need a refill on your cardiac medications before your next appointment, please call your pharmacy.  Labwork: lft and lipid today HERE IN OUR OFFICE AT [facility]  Take the provided lab slips for you to take with you to the lab for you blood draw.  Testing/Procedures:  Your physician has requested that you have an Exercise Myoview. A cardiac stress test is a cardiological test that measures the heart's ability to respond to external stress in a controlled clinical environment. The stress response is induced by exercise (exercise-treadmill) or by intravenous pharmacological stimulation   For further information please visit [email or web address]. Please follow instructions below.  How to prepare for your Myocardial Perfusion Test:  Hold: CARVEDILOL the morning of the testing  Do not eat or drink 3 hours prior to your test, except you may have water.  Do not consume products containing caffeine (regular or decaffeinated) 12 hours prior to your test. (ex: coffee, chocolate, sodas, tea).  Do wear comfortable clothes (no dresses or overalls) and walking shoes, tennis shoes preferred (No heels or open toe shoes are allowed).  Do NOT wear cologne, perfume, aftershave, or lotions (deodorant is allowed).  If these instructions are not followed, your test will have to be rescheduled.  If you have questions or concerns about your appointment, you can call the Nuclear Lab at [phone].  Follow-Up: Your physician wants you to follow-up in: [place] ([place]), DNP.   Thank you for choosing [facility]!!

## 2017-08-26 NOTE — Progress Notes (Signed)
Cardiology Office Note   Date:  08/26/2017   ID:  Kirk, [name] 1955-11-17, MRN [id number]  PCP:  Scot Jun, FNP  Cardiologist: Dr. Angelena Form  Chief Complaint  Patient presents with  . Shortness of Breath  . Chest Pain     History of Present Illness: Kirk [name] is a 63 y.o. male who presents for ongoing assessment and management of peripheral arterial disease, hypertension, hyperlipidemia, with other history to include diabetes.  The patient has a history of bilateral lower extremity stenting in 2003.  He had a N STEMI on 09/05/2015 with high-grade mid LAD stenosis treated with a drug-eluting stent x1.  He had mild to moderate disease in the circumflex and RCA.    He was last seen by Dr. Angelena Form on 07/07/2017 and was without cardiac complaints.  He did have some pain in his legs while he was walking his dog at rest.  Most recent echocardiogram revealed subvalvular LVOT deceleration was SAM on echo.  ABIs were scheduled in the setting of recurrent leg pain.  The patient complained of shortness of breath and called our office on 07/27/2017 which he stated  was occurring approximately a week or so, and therefore added to our schedule today.   He states that his breathing status is changed over the last couple of weeks mostly with exertion.  He states climbing stairs , carrying something heavy like a trolling motor, or walking very quickly, causing him to have to stop and catch his breath.  He states that he is has not occurred in the past.  This is not similar to what he experienced prior to PCI of the LAD in 2017.  At that time he was having severe chest pain and heartburn.  He is having some mild heartburn now but not severe.  Usually goes away with rest.  Past Medical History:  Diagnosis Date  . Arthritis    "right knee" (09/04/2015)  . HTN (hypertension)   . Hyperlipidemia   . NSTEMI (non-ST elevated myocardial infarction) ([place]) 09/04/2015   a. cath 09/05/2015: 95% stenosis  mid-LAD, 55% mid-RCA, and 40% prox-RCA. PCI performed w/ Synergy DES to LAD  . PAD (peripheral artery disease) ([facility])    Stenting of bilateral iliacs in 2003.  [name] Refusal of blood transfusions as patient is Jehovah's Witness   . Type II diabetes mellitus ([place])     Past Surgical History:  Procedure Laterality Date  . CARDIAC CATHETERIZATION N/A 09/05/2015   Procedure: Left Heart Cath and Coronary Angiography;  Surgeon: Troy Sine, MD;  Location: [place] [facility];  Service: Cardiovascular;  Laterality: N/A;  . CARDIAC CATHETERIZATION N/A 09/05/2015   Procedure: Coronary Stent Intervention;  Surgeon: Troy Sine, MD;  Location: [place] [facility];  Service: Cardiovascular;  Laterality: N/A;  . 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 PERIPHERAL VASCULAR CATHETERIZATION Bilateral 2003   "stenting"  . TONSILLECTOMY  ~ 1963     Current Outpatient Medications  Medication Sig Dispense Refill  . aspirin 81 MG tablet Take 81 mg by mouth daily.    . carvedilol (COREG) 6.25 MG tablet Take 1 tablet (6.25 mg total) by mouth 2 (two) times daily with a meal. 60 tablet 10  . cetirizine (ZYRTEC) 10 MG tablet Take 1 tablet (10 mg total) by mouth daily. 30 tablet 11  . clopidogrel (PLAVIX) 75 MG tablet TAKE 1 TABLET BY MOUTH ONCE DAILY 30 tablet 10  .  clotrimazole-betamethasone (LOTRISONE) cream Apply 1 application topically 2 (two) times daily. 90 g 0  . glimepiride (AMARYL) 4 MG tablet take 1 tablet once daily 90 tablet 1  . ipratropium (ATROVENT) 0.03 % nasal spray Place 2 sprays into both nostrils 2 (two) times daily. 30 mL 0  . lactulose (CHRONULAC) 10 GM/15ML solution Take 30 mLs (20 g total) by mouth 2 (two) times daily as needed for moderate constipation. 240 mL 0  . lisinopril (PRINIVIL,ZESTRIL) 10 MG tablet TAKE 1 TABLET BY MOUTH ONCE DAILY 90 tablet 0  . metFORMIN (GLUCOPHAGE) 1000 MG tablet Take 1 tablet (1,000 mg total) by mouth 2 (two) times daily with a meal.  180 tablet 2  . nitroGLYCERIN (NITROSTAT) 0.4 MG SL tablet Place 1 tablet (0.4 mg total) under the tongue every 5 (five) minutes x 3 doses as needed for chest pain. 25 tablet 2  . promethazine-codeine (PHENERGAN WITH CODEINE) 6.25-10 MG/5ML syrup Take 7.5 mLs by mouth every 6 (six) hours as needed for cough. 120 mL 0  . rosuvastatin (CRESTOR) 40 MG tablet take 1 tablet by mouth once daily 30 tablet 11   No current facility-administered medications for this visit.     Allergies:   Patient has no known allergies.    Social History:  The patient  reports that he quit smoking about 14 years ago. His smoking use included cigarettes. He has a 70.00 pack-year smoking history. He has never used smokeless tobacco. He reports that he drinks alcohol. He reports that he has current or past drug history. Drug: Marijuana.   Family History:  The patient's family history includes Cancer - Prostate in his father; Lymphoma in his mother.    ROS: All other systems are reviewed and negative. Unless otherwise mentioned in H&P    PHYSICAL EXAM: VS:  Ht 6\' 3"  (1.905 m)   Wt 234 lb (106.1 kg)   BMI 29.25 kg/m  , BMI Body mass index is 29.25 kg/m. GEN: Well nourished, well developed, in no acute distress  HEENT: normal  Neck: no JVD, carotid bruits, or masses Cardiac: RRR; no murmurs, rubs, or gallops,no edema  Respiratory:  clear to auscultation with the exception of LLL crackles. Not cleared with cough, normal work of breathing GI: soft, nontender, nondistended, + BS MS: no deformity or atrophy  Skin: warm and dry, no rash Neuro:  Strength and sensation are intact Psych: euthymic mood, full affect   EKG:  Sinus rhythm rate of 76 bpm, computer states "accelerated juncitonal rhythm but p-waves are seen on inversion of EKG.   Recent Labs: 10/01/2016: ALT 25; BUN 19; Creat 1.06; Hemoglobin 13.1; Platelets 268; Potassium 4.8; Sodium 140    Lipid Panel    Component Value Date/Time   CHOL 117 (L)  03/12/2016 0837   CHOL 164 06/08/2015 0840   TRIG 86 03/12/2016 0837   HDL 32 (L) 03/12/2016 0837   HDL 35 (L) 06/08/2015 0840   CHOLHDL 3.7 03/12/2016 0837   VLDL 17 03/12/2016 0837   LDLCALC 68 03/12/2016 0837   LDLCALC 101 (H) 06/08/2015 0840      Wt Readings from Last 3 Encounters:  08/26/17 234 lb (106.1 kg)  07/28/17 238 lb (108 kg)  07/07/17 234 lb (106.1 kg)    Other studies Reviewed:  ABIs: 08/06/2017 +-------+-----------+-----------+------------+------------+ ABI/TBIToday's ABIToday's TBIPrevious ABIPrevious TBI +-------+-----------+-----------+------------+------------+ Right 0.63    0.39    0.62    0.45     +-------+-----------+-----------+------------+------------+ Left  0.61    0.42  0.64    0.45     +-------+-----------+-----------+------------+------------+  Bilateral ABIs appear essentially unchanged compared to prior study on 10/14.  Final Interpretation: Right: Resting right ankle-brachial index indicates moderate right lower extremity arterial disease. The right toe-brachial index is abnormal. PPG tracings appear dampened. Left: Resting left ankle-brachial index indicates moderate left lower extremity arterial disease. The left toe-brachial index is abnormal.  Echocardiogram 09/06/2015 Left ventricle: The cavity size was normal. There was moderate   focal basal hypertrophy of the septum. Systolic function was   normal. The estimated ejection fraction was in the range of 60%   to 65%. Wall motion was normal; there were no regional wall   motion abnormalities. Doppler parameters are consistent with   abnormal left ventricular relaxation (grade 1 diastolic   dysfunction). The E/e&' ratio is between 8-15, suggesting   indeterminate LV filling pressure. - Aortic valve: Trileaflet. Cusp separation was normal. There was   no stenosis. Subvalvular LVOT acceleration is noted. There was no   regurgitation. -  Mitral valve: SAM. Mildly thickened leaflets . There was trivial   regurgitation. - Left atrium: The atrium was normal in size. - Inferior vena cava: The vessel was normal in size. The   respirophasic diameter changes were in the normal range (>= 50%),   consistent with normal central venous pressure.  Impressions:  - LVEF 60-65%, moderate basal septal hypertrophy with SAM and LVOT   acceleration, normal wall motion, diastolic dysfunction with   indeterminate LV filling pressure, normal LA size, normal IVC.  Cardiac Cath 09/05/2015 Conclusion    Ost LM to LM lesion, 25% stenosed.  Mid Cx lesion, 30% stenosed.  Dist Cx lesion, 30% stenosed.  Prox RCA lesion, 40% stenosed.  Mid RCA lesion, 55% stenosed.  Mid LAD lesion, 95% stenosed. Post intervention, there is a 0% residual stenosis.   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     ASSESSMENT AND PLAN:  1. DOE: Worsening symptoms of the last week and 1/2-2 weeks.  Usually with exertion.  He describes his exertion as going up stairs, carrying something heavy, walking quickly.  States he has to stop and rest.  He had not had to do that prior to 2 weeks ago.  He has been having some heartburn symptoms but not severe.  Nothing compared to what he experienced prior to catheterization with PCI to the LAD.  Due to these new symptoms, I am going to schedule him for a nuclear medicine stress test to evaluate for progression of CAD and ischemia.  The patient is given a new prescription for nitroglycerin as he has not had it refilled since 2017.  He is to continue on current medication regimen with the exception of carvedilol which he will hold the morning of stress  test.  2.  Coronary artery disease: Most recent cardiac catheterization in April 2017 which revealed a 95% mid LAD requiring stenting.  He had nonobstructive disease elsewhere with second significance of stenosis of 50% in the right coronary artery at that time.  He continues beta-blocker aspirin and statin therapy.  Is also on lisinopril.  3.  Hypercholesterolemia: He is fasting on this early morning appointment, and therefore will plan fasting lipids and LFTs this morning on his  way out of the door.  He remains on statin therapy would like to check his status as I do not have any recent documentation 2017.  4. PAD: Bilateral ABIs were completed in February 2019 as above.  No changes since prior ABIs completed.  He continues to have mild intermittent claudication symptoms which are not stopping him from walking once stress test is completed, if normal, can consider referring him to Dr. Fletcher Anon or Dr. Gwenlyn Found, at the discretion of Dr. Angelena Form.  5. Diabetes: Followed by PCP.   Current medicines are reviewed at length with the patient today.    Labs/ tests ordered today include: Fasting lipids LFTs, nuclear medicine stress test.  Phill Myron. West Pugh, ANP, AACC   08/26/2017 8:16 AM    [facility] [address], [place], [place] [address] Phone: [phone]; Fax: [phone]

## 2017-08-26 NOTE — Telephone Encounter (Signed)
Thanks, chris 

## 2017-08-27 NOTE — Addendum Note (Signed)
Addended by: Leanord Asal T on: 08/27/2017 04:34 PM   Modules accepted: Orders

## 2017-08-29 ENCOUNTER — Telehealth (HOSPITAL_COMMUNITY): Payer: Self-pay

## 2017-08-29 NOTE — Telephone Encounter (Signed)
Encounter complete. 

## 2017-09-03 ENCOUNTER — Ambulatory Visit (HOSPITAL_COMMUNITY)
Admission: RE | Admit: 2017-09-03 | Discharge: 2017-09-03 | Disposition: A | Discharge status: Home / Self Care | Payer: BLUE CROSS/BLUE SHIELD | Source: Ambulatory Visit | Attending: Cardiovascular Disease | Admitting: Cardiovascular Disease

## 2017-09-03 DIAGNOSIS — R0609 Other forms of dyspnea: Secondary | ICD-10-CM

## 2017-09-03 DIAGNOSIS — R9439 Abnormal result of other cardiovascular function study: Secondary | ICD-10-CM | POA: Insufficient documentation

## 2017-09-03 DIAGNOSIS — R0600 Dyspnea, unspecified: Secondary | ICD-10-CM

## 2017-09-03 LAB — MYOCARDIAL PERFUSION IMAGING
CHL CUP MPHR: 157 {beats}/min
CHL CUP NUCLEAR SDS: 2
CHL CUP RESTING HR STRESS: 70 {beats}/min
CHL RATE OF PERCEIVED EXERTION: 19
CSEPED: 7 min
CSEPHR: 96 %
Estimated workload: 8.5 METS
Exercise duration (sec): 50 s
LV sys vol: 34 mL
LVDIAVOL: 98 mL (ref 62–150)
Peak HR: 151 {beats}/min
SRS: 0
SSS: 2
TID: 1.13

## 2017-09-03 MED ORDER — TECHNETIUM TC 99M TETROFOSMIN IV KIT
30.4000 | PACK | Freq: Once | INTRAVENOUS | Status: AC | PRN
  Administered 2017-09-03: 30.4 via INTRAVENOUS
  Filled 2017-09-03: qty 31

## 2017-09-03 MED ORDER — TECHNETIUM TC 99M TETROFOSMIN IV KIT
9.6000 | PACK | Freq: Once | INTRAVENOUS | Status: AC | PRN
  Administered 2017-09-03: 9.6 via INTRAVENOUS
  Filled 2017-09-03: qty 10

## 2017-09-08 NOTE — Progress Notes (Signed)
Cardiology Office Note   Date:  09/09/2017   ID:  Kirk [name], Kirk [name] May 19, 1956, MRN [id number]  PCP:  Scot Jun, Kirk [name]  Cardiologist: Dr. Angelena [name] Chief Complaint  Patient presents with  . Follow-up  . Shortness of Breath     History of Present Illness: Kirk [name] is a 62 y.o. male who presents for ongoing assessment and management of hypertension, hyperlipidemia, peripheral arterial disease, with a history of diabetes.  The patient has a history of lower extremity stenting in 2003, N STEMI on 09/05/2015 with high-grade mid LAD stenosis treated with a drug-eluting stent with mild to moderate disease in the circumflex and RCA.  He was last seen in the office on 08/26/2017 by me with complaints of worsening breathing status over the last couple of weeks mostly with exertion, climbing stairs or carrying something heavy like a trolling motor or walking quickly causing him to have to stop and catch his breath.\  Due to the new symptoms the patient was scheduled for nuclear medicine stress test to evaluate for progression of CAD and ischemia.  He was also given a new prescription for nitroglycerin.  NM Stress Test 09/03/2017  The left ventricular ejection fraction is hyperdynamic (>65%).  Nuclear stress EF: 66%.  Blood pressure demonstrated a normal response to exercise.  Upsloping ST segment depression ST segment depression was noted during stress.  No T wave inversion was noted during stress.  Defect 1: There is a small defect of mild severity present in the mid inferior and apical inferior location. This may represent mild ischemia or diaphragmatic attenuation. Given the normal systolic function in this distribution, favor attenuation artifact.  This is a low risk study.  He has no further complaints or new complaints.  He continues to have some mild dyspnea but it is not severe enough to stop him from what he is doing.  I have free reviewed his stress test with him and  discussed that this was a low risk procedure.  He verbalizes understanding.  Past Medical History:  Diagnosis Date  . Arthritis    "right knee" (09/04/2015)  . HTN (hypertension)   . Hyperlipidemia   . NSTEMI (non-ST elevated myocardial infarction) ([place]) 09/04/2015   a. cath 09/05/2015: 95% stenosis mid-LAD, 55% mid-RCA, and 40% prox-RCA. PCI performed w/ Synergy DES to LAD  . PAD (peripheral artery disease) ([facility])    Stenting of bilateral iliacs in 2003.  [name] Refusal of blood transfusions as patient is Jehovah's Witness   . Type II diabetes mellitus ([place])     Past Surgical History:  Procedure Laterality Date  . CARDIAC CATHETERIZATION N/A 09/05/2015   Procedure: Left Heart Cath and Coronary Angiography;  Surgeon: Kirk Sine, MD;  Location: [facility] [facility];  Service: Cardiovascular;  Laterality: N/A;  . CARDIAC CATHETERIZATION N/A 09/05/2015   Procedure: Coronary Stent Intervention;  Surgeon: Kirk Sine, MD;  Location: [place] [facility];  Service: Cardiovascular;  Laterality: N/A;  . 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 PERIPHERAL VASCULAR CATHETERIZATION Bilateral 2003   "stenting"  . TONSILLECTOMY  ~ 1963     Current Outpatient Medications  Medication Sig Dispense Refill  . aspirin 81 MG tablet Take 81 mg by mouth daily.    . carvedilol (COREG) 6.25 MG tablet Take 1 tablet (6.25 mg total) by mouth 2 (two) times daily with a meal. 60 tablet 10  . cetirizine (ZYRTEC) 10 MG tablet Take  1 tablet (10 mg total) by mouth daily. 30 tablet 11  . clopidogrel (PLAVIX) 75 MG tablet TAKE 1 TABLET BY MOUTH ONCE DAILY 30 tablet 10  . clotrimazole-betamethasone (LOTRISONE) cream Apply 1 application topically 2 (two) times daily. 90 g 0  . glimepiride (AMARYL) 4 MG tablet take 1 tablet once daily 90 tablet 1  . ipratropium (ATROVENT) 0.03 % nasal spray Place 2 sprays into both nostrils 2 (two) times daily. 30 mL 0  . lactulose (CHRONULAC) 10 GM/15ML solution  Take 30 mLs (20 g total) by mouth 2 (two) times daily as needed for moderate constipation. 240 mL 0  . lisinopril (PRINIVIL,ZESTRIL) 10 MG tablet TAKE 1 TABLET BY MOUTH ONCE DAILY 90 tablet 0  . metFORMIN (GLUCOPHAGE) 1000 MG tablet Take 1 tablet (1,000 mg total) by mouth 2 (two) times daily with a meal. 180 tablet 2  . nitroGLYCERIN (NITROSTAT) 0.4 MG SL tablet Place 1 tablet (0.4 mg total) under the tongue every 5 (five) minutes x 3 doses as needed for chest pain. 25 tablet 2  . promethazine-codeine (PHENERGAN WITH CODEINE) 6.25-10 MG/5ML syrup Take 7.5 mLs by mouth every 6 (six) hours as needed for cough. 120 mL 0  . rosuvastatin (CRESTOR) 40 MG tablet take 1 tablet by mouth once daily 30 tablet 11   No current facility-administered medications for this visit.     Allergies:   Patient has no known allergies.    Social History:  The patient  reports that he quit smoking about 14 years ago. His smoking use included cigarettes. He has a 70.00 pack-year smoking history. He has never used smokeless tobacco. He reports that he drinks alcohol. He reports that he has current or past drug history. Drug: Marijuana.   Family History:  The patient's family history includes Cancer - Prostate in his father; Lymphoma in his mother.    ROS: All other systems are reviewed and negative. Unless otherwise mentioned in H&P    PHYSICAL EXAM: VS:  BP 122/60   Pulse 75   Ht 6\' 3"  (1.905 m)   Wt 230 lb 3.2 oz (104.4 kg)   SpO2 97%   BMI 28.77 kg/m  , BMI Body mass index is 28.77 kg/m. GEN: Well nourished, well developed, in no acute distress  HEENT: normal  Neck: no JVD, carotid bruits, or masses Cardiac: RRR; no murmurs, rubs, or gallops,no edema  Respiratory:  Clear to auscultation bilaterally, normal work of breathing GI: soft, nontender, nondistended, + BS MS: no deformity or atrophy  Skin: warm and dry, no rash Neuro:  Strength and sensation are intact Psych: euthymic mood, full  affect   EKG: Not completed on this office visit  Recent Labs: 10/01/2016: BUN 19; Creat 1.06; Hemoglobin 13.1; Platelets 268; Potassium 4.8; Sodium 140 08/26/2017: ALT 20    Lipid Panel    Component Value Date/Time   CHOL 102 08/26/2017 0911   TRIG 83 08/26/2017 0911   HDL 31 (L) 08/26/2017 0911   CHOLHDL 3.3 08/26/2017 0911   CHOLHDL 3.7 03/12/2016 0837   VLDL 17 03/12/2016 0837   LDLCALC 54 08/26/2017 0911      Wt Readings from Last 3 Encounters:  09/09/17 230 lb 3.2 oz (104.4 kg)  08/26/17 234 lb (106.1 kg)  07/28/17 238 lb (108 kg)      Other studies Reviewed: Echocardiogram 10/04/2015  Left ventricle: The cavity size was normal. There was moderate   focal basal hypertrophy of the septum. Systolic function was   normal.  The estimated ejection fraction was in the range of 60%   to 65%. Wall motion was normal; there were no regional wall   motion abnormalities. Doppler parameters are consistent with   abnormal left ventricular relaxation (grade 1 diastolic   dysfunction). The E/e&' ratio is between 8-15, suggesting   indeterminate LV filling pressure. - Aortic valve: Trileaflet. Cusp separation was normal. There was   no stenosis. Subvalvular LVOT acceleration is noted. There was no   regurgitation. - Mitral valve: SAM. Mildly thickened leaflets . There was trivial   regurgitation. - Left atrium: The atrium was normal in size. - Inferior vena cava: The vessel was normal in size. The   respirophasic diameter changes were in the normal range (>= 50%),   consistent with normal central venous pressure.   ASSESSMENT AND PLAN:  1.  Dyspnea on exertion with mild chest pressure:  The patient had nuclear medicine stress test which was low risk but did have some ST-T wave abnormalities which were nondiagnostic.  There is no evidence of ischemia.  I have explained this to the patient.  I did tell him that the stress tests are approximately 85% accurate.  If his symptoms  worsen or he has new symptoms of chest pressure dizziness or near syncope follow-up with cardiac catheterization.  He verbalizes understanding.  2.  Coronary artery disease: History of non-STEMI in April 2017 with high-grade mid LAD stenosis treated with drug-eluting stent with mild to moderate disease in the circumflex and RCA.  Continue medical management.  We will proceed with cardiac catheterization   3.  Hypertension: Excellent control on current medication regimen no changes planned.  4.  Hypercholesterolemia: Continue statin therapy.  Follow-up labs per PCP  Current medicines are reviewed at length with the patient today.    Labs/ tests ordered today include: None   Phill Myron. West Pugh, ANP, AACC   09/09/2017 1:24 PM    [facility] [address] [place], [place] [address] Phone: [phone]; Fax: [phone]

## 2017-09-09 ENCOUNTER — Ambulatory Visit: Payer: BLUE CROSS/BLUE SHIELD | Admitting: Adult Health

## 2017-09-09 ENCOUNTER — Encounter: Payer: Self-pay | Admitting: Adult Health

## 2017-09-09 VITALS — BP 122/60 | HR 75 | Ht 75.0 in | Wt 230.2 lb

## 2017-09-09 DIAGNOSIS — I10 Essential (primary) hypertension: Secondary | ICD-10-CM

## 2017-09-09 DIAGNOSIS — I2510 Atherosclerotic heart disease of native coronary artery without angina pectoris: Secondary | ICD-10-CM

## 2017-09-09 DIAGNOSIS — E7800 Pure hypercholesterolemia, unspecified: Secondary | ICD-10-CM

## 2017-09-09 NOTE — Patient Instructions (Signed)
Follow-Up: Your physician wants you to follow-up in: [place] should receive a reminder letter in the mail two months in advance. If you do not receive a letter, please call our office 06-2018 to schedule the 08-2018 follow-up appointment.   Thank you for choosing [facility] at [facility]!!

## 2017-09-18 ENCOUNTER — Ambulatory Visit: Payer: BLUE CROSS/BLUE SHIELD | Admitting: Physician Assistant

## 2017-09-29 ENCOUNTER — Ambulatory Visit: Payer: BLUE CROSS/BLUE SHIELD | Admitting: Family Medicine

## 2017-10-21 ENCOUNTER — Other Ambulatory Visit: Payer: Self-pay | Admitting: Family Medicine

## 2017-10-21 DIAGNOSIS — I10 Essential (primary) hypertension: Secondary | ICD-10-CM

## 2017-12-03 ENCOUNTER — Ambulatory Visit (INDEPENDENT_AMBULATORY_CARE_PROVIDER_SITE_OTHER): Payer: BLUE CROSS/BLUE SHIELD | Admitting: Family Medicine

## 2017-12-03 ENCOUNTER — Encounter: Payer: Self-pay | Admitting: Family Medicine

## 2017-12-03 VITALS — BP 116/72 | HR 100 | Temp 98.1°F | Ht 75.0 in | Wt 225.0 lb

## 2017-12-03 DIAGNOSIS — R05 Cough: Secondary | ICD-10-CM | POA: Diagnosis not present

## 2017-12-03 DIAGNOSIS — I10 Essential (primary) hypertension: Secondary | ICD-10-CM | POA: Diagnosis not present

## 2017-12-03 DIAGNOSIS — J329 Chronic sinusitis, unspecified: Secondary | ICD-10-CM | POA: Diagnosis not present

## 2017-12-03 DIAGNOSIS — Z09 Encounter for follow-up examination after completed treatment for conditions other than malignant neoplasm: Secondary | ICD-10-CM | POA: Diagnosis not present

## 2017-12-03 DIAGNOSIS — R059 Cough, unspecified: Secondary | ICD-10-CM

## 2017-12-03 MED ORDER — AMOXICILLIN-POT CLAVULANATE 875-125 MG PO TABS
1.0000 | ORAL_TABLET | Freq: Two times a day (BID) | ORAL | 0 refills | Status: DC
Start: 2017-12-03 — End: 2018-01-05

## 2017-12-03 MED ORDER — IPRATROPIUM BROMIDE 0.03 % NA SOLN: 2.0000 | Freq: Two times a day (BID) | NASAL | 2 refills | Status: DC

## 2017-12-03 NOTE — Progress Notes (Signed)
Sick Visit  Subjective:    Patient ID: Kirk [name], male    DOB: 1956-03-04, 62 y.o.   MRN: [id number]   PCP: Kathe Becton, NP  Chief Complaint  Patient presents with  . Cough  . Nasal Congestion    HPI  [name] has a past medical history of Diabetes II, PAD. NSTEMI, Hyperlipidemia, Hypertension, and Arthritis. He is here today for follow.   Current Status: Since his  last office visit, he has mild cough, with yellow productive sputum for 2 weeks now. He has been taking Nyquil and Dayquil with no relief. He reports congestion.   He denies fevers, chills, fatigue, recent infections, weight loss, and night sweats. He has not had any headaches, visual changes, dizziness, and falls. No chest pain, heart palpitations, and shortness of breath reported. No reports of GI problems such as nausea, vomiting, diarrhea, and constipation. He has no reports of blood in stools, dysuria and hematuria. No depression or anxiety.  He has generalized joint pain today.   Past Medical History:  Diagnosis Date  . Arthritis    "right knee" (09/04/2015)  . HTN (hypertension)   . Hyperlipidemia   . NSTEMI (non-ST elevated myocardial infarction) ([facility]) 09/04/2015   a. cath 09/05/2015: 95% stenosis mid-LAD, 55% mid-RCA, and 40% prox-RCA. PCI performed w/ Synergy DES to LAD  . PAD (peripheral artery disease) ([facility])    Stenting of bilateral iliacs in 2003.  [name] Refusal of blood transfusions as patient is Jehovah's Witness   . Type II diabetes mellitus ([facility])     Family History  Problem Relation Age of Onset  . Lymphoma Mother   . Cancer - Prostate Father   . CAD Neg Hx     Social History   Socioeconomic History  . Marital status: Divorced    Spouse name: Not on file  . Number of children: 1  . Years of education: Not on file  . Highest education level: Not on file  Occupational History  . Occupation: Acupuncturist  Social Needs  . Financial resource strain: Not on file  . Food insecurity:     Worry: Not on file    Inability: Not on file  . Transportation needs:    Medical: Not on file    Non-medical: Not on file  Tobacco Use  . Smoking status: Former Smoker    Packs/day: 2.00    Years: 35.00    Pack years: 70.00    Types: Cigarettes    Last attempt to quit: 02/19/2003    Years since quitting: 14.7  . Smokeless tobacco: Never Used  Substance and Sexual Activity  . Alcohol use: Yes    Alcohol/week: 0.0 oz    Comment: 09/04/2015 "nothing since 1980s"  . 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 Sexual activity: Not on file  Lifestyle  . Physical activity:    Days per week: Not on file    Minutes per session: Not on file  . Stress: Not on file  Relationships  . Social connections:    Talks on phone: Not on file    Gets together: Not on file    Attends religious service: Not on file    Active member of club or organization: Not on file    Attends meetings of clubs or organizations: Not on file    Relationship status: Not on file  . Intimate partner violence:    Fear of  current or ex partner: Not on file    Emotionally abused: Not on file    Physically abused: Not on file    Forced sexual activity: Not on file  Other Topics Concern  . Not on file  Social History Narrative  . Not on file    Past Surgical History:  Procedure Laterality Date  . CARDIAC CATHETERIZATION N/A 09/05/2015   Procedure: Left Heart Cath and Coronary Angiography;  Surgeon: Troy Sine, MD;  Location: [facility];  Service: Cardiovascular;  Laterality: N/A;  . CARDIAC CATHETERIZATION N/A 09/05/2015   Procedure: Coronary Stent Intervention;  Surgeon: Troy Sine, MD;  Location: [facility];  Service: Cardiovascular;  Laterality: N/A;  . 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 PERIPHERAL VASCULAR CATHETERIZATION Bilateral 2003   "stenting"  . TONSILLECTOMY  ~ 1963     There is no immunization history on  file for this patient.  Current Meds  Medication Sig  . aspirin 81 MG tablet Take 81 mg by mouth daily.  . carvedilol (COREG) 6.25 MG tablet Take 1 tablet (6.25 mg total) by mouth 2 (two) times daily with a meal.  . cetirizine (ZYRTEC) 10 MG tablet Take 1 tablet (10 mg total) by mouth daily.  . clopidogrel (PLAVIX) 75 MG tablet TAKE 1 TABLET BY MOUTH ONCE DAILY  . glimepiride (AMARYL) 4 MG tablet take 1 tablet once daily  . ipratropium (ATROVENT) 0.03 % nasal spray Place 2 sprays into both nostrils 2 (two) times daily.  [name] lactulose (CHRONULAC) 10 GM/15ML solution Take 30 mLs (20 g total) by mouth 2 (two) times daily as needed for moderate constipation.  [name] lisinopril (PRINIVIL,ZESTRIL) 10 MG tablet TAKE 1 TABLET BY MOUTH EVERY DAY  . metFORMIN (GLUCOPHAGE) 1000 MG tablet Take 1 tablet (1,000 mg total) by mouth 2 (two) times daily with a meal.  . nitroGLYCERIN (NITROSTAT) 0.4 MG SL tablet Place 1 tablet (0.4 mg total) under the tongue every 5 (five) minutes x 3 doses as needed for chest pain.  . rosuvastatin (CRESTOR) 40 MG tablet take 1 tablet by mouth once daily    No Known Allergies  BP 116/72 (BP Location: Right Arm, Patient Position: Sitting, Cuff Size: Large)   Pulse 100   Temp 98.1 F (36.7 C) (Oral)   Ht 6\' 3"  (1.905 m)   Wt 225 lb (102.1 kg)   SpO2 96%   BMI 28.12 kg/m   Review of Systems  Constitutional: Negative.   HENT: Negative.   Eyes: Negative.   Respiratory: Negative.   Cardiovascular: Negative.   Gastrointestinal: Negative.   Endocrine: Negative.   Genitourinary: Negative.   Musculoskeletal: Negative.   Skin: Negative.   Allergic/Immunologic: Negative.   Neurological: Negative.   Hematological: Negative.   Psychiatric/Behavioral: Negative.    Objective:   Physical Exam  Constitutional: He is oriented to person, place, and time. He appears well-developed and well-nourished.  HENT:  Head: Normocephalic and atraumatic.  Right Ear: External ear normal.   Left Ear: External ear normal.  Nose: Nose normal.  Mouth/Throat: Oropharynx is clear and moist.  Eyes: Pupils are equal, round, and reactive to light. Conjunctivae and EOM are normal.  Neck: Normal range of motion. Neck supple.  Cardiovascular: Normal rate, regular rhythm, normal heart sounds and intact distal pulses.  Pulmonary/Chest: Effort normal and breath sounds normal.  Abdominal: Soft. Bowel sounds are normal.  Musculoskeletal: Normal range of motion.  Neurological: He  is alert and oriented to person, place, and time.  Skin: Skin is warm and dry. Capillary refill takes less than 2 seconds.  Psychiatric: He has a normal mood and affect. His behavior is normal. Judgment and thought content normal.  Nursing note and vitals reviewed.  Assessment & Plan:   1. Sinusitis, unspecified chronicity, unspecified location - amoxicillin-clavulanate (AUGMENTIN) 875-125 MG tablet; Take 1 tablet by mouth 2 (two) times daily.  Dispense: 14 tablet; Refill: 0 - ipratropium (ATROVENT) 0.03 % nasal spray; Place 2 sprays into both nostrils 2 (two) times daily.  Dispense: 30 mL; Refill: 2  2. Cough Stable. Not worsening.   3. Essential hypertension Blood pressure is stable at 116/72 today. She will continue Carvidilol, Plavix, and Lisinopril as prescribed. She will continue to decrease high sodium intake, excessive alcohol intake, increase potassium intake, smoking cessation, and increase physical activity of at least 30 minutes of cardio activity daily. She will continue to follow Heart Healthy or DASH diet.  4. Follow up He will follow up in 1 month.   Meds ordered this encounter  Medications  . amoxicillin-clavulanate (AUGMENTIN) 875-125 MG tablet    Sig: Take 1 tablet by mouth 2 (two) times daily.    Dispense:  14 tablet    Refill:  0  . ipratropium (ATROVENT) 0.03 % nasal spray    Sig: Place 2 sprays into both nostrils 2 (two) times daily.    Dispense:  30 mL    Refill:  [place],  MSN, FNP-C [facility] [address] [place], [place] [address] [phone]

## 2017-12-12 ENCOUNTER — Other Ambulatory Visit: Payer: Self-pay | Admitting: Family Medicine

## 2017-12-31 ENCOUNTER — Other Ambulatory Visit: Payer: Self-pay | Admitting: Physician Assistant

## 2018-01-05 ENCOUNTER — Encounter: Payer: Self-pay | Admitting: Family Medicine

## 2018-01-05 ENCOUNTER — Ambulatory Visit (INDEPENDENT_AMBULATORY_CARE_PROVIDER_SITE_OTHER): Payer: BLUE CROSS/BLUE SHIELD | Admitting: Family Medicine

## 2018-01-05 VITALS — BP 140/68 | HR 80 | Temp 98.1°F | Ht 75.0 in | Wt 229.0 lb

## 2018-01-05 DIAGNOSIS — R8290 Unspecified abnormal findings in urine: Secondary | ICD-10-CM

## 2018-01-05 DIAGNOSIS — Z09 Encounter for follow-up examination after completed treatment for conditions other than malignant neoplasm: Secondary | ICD-10-CM

## 2018-01-05 DIAGNOSIS — E119 Type 2 diabetes mellitus without complications: Secondary | ICD-10-CM | POA: Diagnosis not present

## 2018-01-05 DIAGNOSIS — I10 Essential (primary) hypertension: Secondary | ICD-10-CM | POA: Diagnosis not present

## 2018-01-05 LAB — POCT URINALYSIS DIP (MANUAL ENTRY)
Bilirubin, UA: NEGATIVE
Glucose, UA: NEGATIVE mg/dL
Ketones, POC UA: NEGATIVE mg/dL
Leukocytes, UA: NEGATIVE
Nitrite, UA: NEGATIVE
Spec Grav, UA: 1.01 (ref 1.010–1.025)
Urobilinogen, UA: 0.2 E.U./dL
pH, UA: 5.5 (ref 5.0–8.0)

## 2018-01-05 LAB — POCT GLYCOSYLATED HEMOGLOBIN (HGB A1C): Hemoglobin A1C: 7.3 % — AB (ref 4.0–5.6)

## 2018-01-05 NOTE — Patient Instructions (Signed)
Heart-Healthy Eating Plan Heart-healthy meal planning includes:  Limiting unhealthy fats.  Increasing healthy fats.  Making other small dietary changes.  You may need to talk with your doctor or a diet specialist (dietitian) to create an eating plan that is right for you. What types of fat should I choose?  Choose healthy fats. These include olive oil and canola oil, flaxseeds, walnuts, almonds, and seeds.  Eat more omega-3 fats. These include salmon, mackerel, sardines, tuna, flaxseed oil, and ground flaxseeds. Try to eat fish at least twice each week.  Limit saturated fats. ? Saturated fats are often found in animal products, such as meats, butter, and cream. ? Plant sources of saturated fats include palm oil, palm kernel oil, and coconut oil.  Avoid foods with partially hydrogenated oils in them. These include stick margarine, some tub margarines, cookies, crackers, and other baked goods. These contain trans fats. What general guidelines do I need to follow?  Check food labels carefully. Identify foods with trans fats or high amounts of saturated fat.  Fill one half of your plate with vegetables and green salads. Eat 4-5 servings of vegetables per day. A serving of vegetables is: ? 1 cup of raw leafy vegetables. ?  cup of raw or cooked cut-up vegetables. ?  cup of vegetable juice.  Fill one fourth of your plate with whole grains. Look for the word "whole" as the first word in the ingredient list.  Fill one fourth of your plate with lean protein foods.  Eat 4-5 servings of fruit per day. A serving of fruit is: ? One medium whole fruit. ?  cup of dried fruit. ?  cup of fresh, frozen, or canned fruit. ?  cup of 100% fruit juice.  Eat more foods that contain soluble fiber. These include apples, broccoli, carrots, beans, peas, and barley. Try to get 20-30 g of fiber per day.  Eat more home-cooked food. Eat less restaurant, buffet, and fast food.  Limit or avoid  alcohol.  Limit foods high in starch and sugar.  Avoid fried foods.  Avoid frying your food. Try baking, boiling, grilling, or broiling it instead. You can also reduce fat by: ? Removing the skin from poultry. ? Removing all visible fats from meats. ? Skimming the fat off of stews, soups, and gravies before serving them. ? Steaming vegetables in water or broth.  Lose weight if you are overweight.  Eat 4-5 servings of nuts, legumes, and seeds per week: ? One serving of dried beans or legumes equals  cup after being cooked. ? One serving of nuts equals 1 ounces. ? One serving of seeds equals  ounce or one tablespoon.  You may need to keep track of how much salt or sodium you eat. This is especially true if you have high blood pressure. Talk with your doctor or dietitian to get more information. What foods can I eat? Grains Breads, including French, white, pita, wheat, raisin, rye, oatmeal, and Italian. Tortillas that are neither fried nor made with lard or trans fat. Low-fat rolls, including hotdog and hamburger buns and English muffins. Biscuits. Muffins. Waffles. Pancakes. Light popcorn. Whole-grain cereals. Flatbread. Melba toast. Pretzels. Breadsticks. Rusks. Low-fat snacks. Low-fat crackers, including oyster, saltine, matzo, graham, animal, and rye. Rice and pasta, including brown rice and pastas that are made with whole wheat. Vegetables All vegetables. Fruits All fruits, but limit coconut. Meats and Other Protein Sources Lean, well-trimmed beef, veal, pork, and lamb. Chicken and turkey without skin. All fish and shellfish.   Wild duck, rabbit, pheasant, and venison. Egg whites or low-cholesterol egg substitutes. Dried beans, peas, lentils, and tofu. Seeds and most nuts. Dairy Low-fat or nonfat cheeses, including ricotta, string, and mozzarella. Skim or 1% milk that is liquid, powdered, or evaporated. Buttermilk that is made with low-fat milk. Nonfat or low-fat  yogurt. Beverages Mineral water. Diet carbonated beverages. Sweets and Desserts Sherbets and fruit ices. Honey, jam, marmalade, jelly, and syrups. Meringues and gelatins. Pure sugar candy, such as hard candy, jelly beans, gumdrops, mints, marshmallows, and small amounts of dark chocolate. [employer] Eat all sweets and desserts in moderation. Fats and Oils Nonhydrogenated (trans-free) margarines. Vegetable oils, including soybean, sesame, sunflower, olive, peanut, safflower, corn, canola, and cottonseed. Salad dressings or mayonnaise made with a vegetable oil. Limit added fats and oils that you use for cooking, baking, salads, and as spreads. Other Cocoa powder. Coffee and tea. All seasonings and condiments. The items listed above may not be a complete list of recommended foods or beverages. Contact your dietitian for more options. What foods are not recommended? Grains Breads that are made with saturated or trans fats, oils, or whole milk. Croissants. Butter rolls. Cheese breads. Sweet rolls. Donuts. Buttered popcorn. Chow mein noodles. High-fat crackers, such as cheese or butter crackers. Meats and Other Protein Sources Fatty meats, such as hotdogs, short ribs, sausage, spareribs, bacon, rib eye roast or steak, and mutton. High-fat deli meats, such as salami and bologna. Caviar. Domestic duck and goose. Organ meats, such as kidney, liver, sweetbreads, and heart. Dairy Cream, sour cream, cream cheese, and creamed cottage cheese. Whole-milk cheeses, including blue (bleu), Monte[place] Jack, [place], [place], American, [place], Swiss, cheddar, [place], and [place]. Whole or 2% milk that is liquid, evaporated, or condensed. Whole buttermilk. Cream sauce or high-fat cheese sauce. Yogurt that is made from whole milk. Beverages Regular sodas and juice drinks with added sugar. Sweets and Desserts Frosting. Pudding. Cookies. Cakes other than angel food cake. Candy that has milk chocolate or white  chocolate, hydrogenated fat, butter, coconut, or unknown ingredients. Buttered syrups. Full-fat ice cream or ice cream drinks. Fats and Oils Gravy that has suet, meat fat, or shortening. Cocoa butter, hydrogenated oils, palm oil, coconut oil, palm kernel oil. These can often be found in baked products, candy, fried foods, nondairy creamers, and whipped toppings. Solid fats and shortenings, including bacon fat, salt pork, lard, and butter. Nondairy cream substitutes, such as coffee creamers and sour cream substitutes. Salad dressings that are made of unknown oils, cheese, or sour cream. The items listed above may not be a complete list of foods and beverages to avoid. Contact your dietitian for more information. This information is not intended to replace advice given to you by your health care provider. Make sure you discuss any questions you have with your health care provider. Document Released: 11/12/2011 Document Revised: 10/19/2015 Document Reviewed: 11/04/2013 Elsevier Interactive Patient Education  2018 [place] Eating Plan DASH stands for "Dietary Approaches to Stop Hypertension." The DASH eating plan is a healthy eating plan that has been shown to reduce high blood pressure (hypertension). It may also reduce your risk for type 2 diabetes, heart disease, and stroke. The DASH eating plan may also help with weight loss. What are tips for following this plan? General guidelines  Avoid eating more than 2,300 mg (milligrams) of salt (sodium) a day. If you have hypertension, you may need to reduce your sodium intake to 1,500 mg a day.  Limit alcohol intake to no more  than 1 drink a day for nonpregnant women and 2 drinks a day for men. One drink equals 12 oz of beer, 5 oz of wine, or 1 oz of hard liquor.  Work with your health care provider to maintain a healthy body weight or to lose weight. Ask what an ideal weight is for you.  Get at least 30 minutes of exercise that causes your  heart to beat faster (aerobic exercise) most days of the week. Activities may include walking, swimming, or biking.  Work with your health care provider or diet and nutrition specialist (dietitian) to adjust your eating plan to your individual calorie needs. Reading food labels  Check food labels for the amount of sodium per serving. Choose foods with less than 5 percent of the Daily Value of sodium. Generally, foods with less than 300 mg of sodium per serving fit into this eating plan.  To find whole grains, look for the word "whole" as the first word in the ingredient list. Shopping  Buy products labeled as "low-sodium" or "no salt added."  Buy fresh foods. Avoid canned foods and premade or frozen meals. Cooking  Avoid adding salt when cooking. Use salt-free seasonings or herbs instead of table salt or sea salt. Check with your health care provider or pharmacist before using salt substitutes.  Do not fry foods. Cook foods using healthy methods such as baking, boiling, grilling, and broiling instead.  Cook with heart-healthy oils, such as olive, canola, soybean, or sunflower oil. Meal planning   Eat a balanced diet that includes: ? 5 or more servings of fruits and vegetables each day. At each meal, try to fill half of your plate with fruits and vegetables. ? Up to 6-8 servings of whole grains each day. ? Less than 6 oz of lean meat, poultry, or fish each day. A 3-oz serving of meat is about the same size as a deck of cards. One egg equals 1 oz. ? 2 servings of low-fat dairy each day. ? A serving of nuts, seeds, or beans 5 times each week. ? Heart-healthy fats. Healthy fats called Omega-3 fatty acids are found in foods such as flaxseeds and coldwater fish, like sardines, salmon, and mackerel.  Limit how much you eat of the following: ? Canned or prepackaged foods. ? Food that is high in trans fat, such as fried foods. ? Food that is high in saturated fat, such as fatty  meat. ? Sweets, desserts, sugary drinks, and other foods with added sugar. ? Full-fat dairy products.  Do not salt foods before eating.  Try to eat at least 2 vegetarian meals each week.  Eat more home-cooked food and less restaurant, buffet, and fast food.  When eating at a restaurant, ask that your food be prepared with less salt or no salt, if possible. What foods are recommended? The items listed may not be a complete list. Talk with your dietitian about what dietary choices are best for you. Grains Whole-grain or whole-wheat bread. Whole-grain or whole-wheat pasta. Brown rice. [place]. Bulgur. Whole-grain and low-sodium cereals. Pita bread. Low-fat, low-sodium crackers. Whole-wheat flour tortillas. Vegetables Fresh or frozen vegetables (raw, steamed, roasted, or grilled). Low-sodium or reduced-sodium tomato and vegetable juice. Low-sodium or reduced-sodium tomato sauce and tomato paste. Low-sodium or reduced-sodium canned vegetables. Fruits All fresh, dried, or frozen fruit. Canned fruit in natural juice (without added sugar). Meat and other protein foods Skinless chicken or [place]. Ground chicken or [place]. Pork with fat trimmed off. Fish and seafood. Egg  whites. Dried beans, peas, or lentils. Unsalted nuts, nut butters, and seeds. Unsalted canned beans. Lean cuts of beef with fat trimmed off. Low-sodium, lean deli meat. Dairy Low-fat (1%) or fat-free (skim) milk. Fat-free, low-fat, or reduced-fat cheeses. Nonfat, low-sodium ricotta or cottage cheese. Low-fat or nonfat yogurt. Low-fat, low-sodium cheese. Fats and oils Soft margarine without trans fats. Vegetable oil. Low-fat, reduced-fat, or light mayonnaise and salad dressings (reduced-sodium). Canola, safflower, olive, soybean, and sunflower oils. Avocado. Seasoning and other foods Herbs. Spices. Seasoning mixes without salt. Unsalted popcorn and pretzels. Fat-free sweets. What foods are not recommended? The items listed  may not be a complete list. Talk with your dietitian about what dietary choices are best for you. Grains Baked goods made with fat, such as croissants, muffins, or some breads. Dry pasta or rice meal packs. Vegetables Creamed or fried vegetables. Vegetables in a cheese sauce. Regular canned vegetables (not low-sodium or reduced-sodium). Regular canned tomato sauce and paste (not low-sodium or reduced-sodium). Regular tomato and vegetable juice (not low-sodium or reduced-sodium). Angie Fava. Olives. Fruits Canned fruit in a light or heavy syrup. Fried fruit. Fruit in cream or butter sauce. Meat and other protein foods Fatty cuts of meat. Ribs. Fried meat. Berniece Salines. Sausage. Bologna and other processed lunch meats. Salami. Fatback. Hotdogs. Bratwurst. Salted nuts and seeds. Canned beans with added salt. Canned or smoked fish. Whole eggs or egg yolks. Chicken or [place] with skin. Dairy Whole or 2% milk, cream, and half-and-half. Whole or full-fat cream cheese. Whole-fat or sweetened yogurt. Full-fat cheese. Nondairy creamers. Whipped toppings. Processed cheese and cheese spreads. Fats and oils Butter. Stick margarine. Lard. Shortening. Ghee. Bacon fat. Tropical oils, such as coconut, palm kernel, or palm oil. Seasoning and other foods Salted popcorn and pretzels. Onion salt, garlic salt, seasoned salt, table salt, and sea salt. Worcestershire sauce. Tartar sauce. Barbecue sauce. Teriyaki sauce. Soy sauce, including reduced-sodium. Steak sauce. Canned and packaged gravies. Fish sauce. Oyster sauce. Cocktail sauce. Horseradish that you find on the shelf. Ketchup. Mustard. Meat flavorings and tenderizers. Bouillon cubes. Hot sauce and Tabasco sauce. Premade or packaged marinades. Premade or packaged taco seasonings. Relishes. Regular salad dressings. Where to find more information:  National Heart, Lung, and [place]: https://wilson-eaton.com/  American Heart Association: www.heart.org Summary  The DASH  eating plan is a healthy eating plan that has been shown to reduce high blood pressure (hypertension). It may also reduce your risk for type 2 diabetes, heart disease, and stroke.  With the DASH eating plan, you should limit salt (sodium) intake to 2,300 mg a day. If you have hypertension, you may need to reduce your sodium intake to 1,500 mg a day.  When on the DASH eating plan, aim to eat more fresh fruits and vegetables, whole grains, lean proteins, low-fat dairy, and heart-healthy fats.  Work with your health care provider or diet and nutrition specialist (dietitian) to adjust your eating plan to your individual calorie needs. This information is not intended to replace advice given to you by your health care provider. Make sure you discuss any questions you have with your health care provider. Document Released: 05/02/2011 Document Revised: 05/06/2016 Document Reviewed: 05/06/2016 Elsevier Interactive Patient Education  [employer].

## 2018-01-05 NOTE — Progress Notes (Signed)
Follow Up  Subjective:    Patient ID: Kirk [name], male    DOB: 02/01/1956, 62 y.o.   MRN: [id number]   Chief Complaint  Patient presents with  . Follow-up    congestion, HTN   HPI  Kirk [name] has a past medical history of Diabetes, NSTEMI, Hyperlipidemia, Hylpertension, and Arthritis. He is here today for follow up.   Current Status: Since his last office visit, he is doing well with no complaints.   He denies fevers, chills, fatigue, recent infections, weight loss, and night sweats.   He has not had any headaches, visual changes, dizziness, and falls.   No chest pain, heart palpitations, cough and shortness of breath reported.   No reports of GI problems such as nausea, vomiting, diarrhea, and constipation. He has no reports of blood in stools, dysuria and hematuria.   No depression or anxiety reported.   He denies pain today.   Past Medical History:  Diagnosis Date  . Arthritis    "right knee" (09/04/2015)  . HTN (hypertension)   . Hyperlipidemia   . NSTEMI (non-ST elevated myocardial infarction) ([place]) 09/04/2015   a. cath 09/05/2015: 95% stenosis mid-LAD, 55% mid-RCA, and 40% prox-RCA. PCI performed w/ Synergy DES to LAD  . PAD (peripheral artery disease) (HCC)    Stenting of bilateral iliacs in 2003.  [name] Refusal of blood transfusions as patient is Jehovah's Witness   . Type II diabetes mellitus (HCC)    Family History  Problem Relation Age of Onset  . Lymphoma Mother   . Cancer - Prostate Father   . CAD Neg Hx     Social History   Socioeconomic History  . Marital status: Divorced    Spouse name: Not on file  . Number of children: 1  . Years of education: Not on file  . Highest education level: Not on file  Occupational History  . Occupation: Acupuncturist  Social Needs  . Financial resource strain: Not on file  . Food insecurity:    Worry: Not on file    Inability: Not on file  . Transportation needs:    Medical: Not on file    Non-medical: Not  on file  Tobacco Use  . Smoking status: Former Smoker    Packs/day: 2.00    Years: 35.00    Pack years: 70.00    Types: Cigarettes    Last attempt to quit: 02/19/2003    Years since quitting: 14.8  . Smokeless tobacco: Never Used  Substance and Sexual Activity  . Alcohol use: Yes    Alcohol/week: 0.0 standard drinks    Comment: 09/04/2015 "nothing since 1980s"  . 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 Sexual activity: Not on file  Lifestyle  . Physical activity:    Days per week: Not on file    Minutes per session: Not on file  . Stress: Not on file  Relationships  . Social connections:    Talks on phone: Not on file    Gets together: Not on file    Attends religious service: Not on file    Active member of club or organization: Not on file    Attends meetings of clubs or organizations: Not on file    Relationship status: Not on file  . Intimate partner violence:    Fear of current or ex partner: Not on file    Emotionally abused: Not on file  Physically abused: Not on file    Forced sexual activity: Not on file  Other Topics Concern  . Not on file  Social History Narrative  . Not on file    Past Surgical History:  Procedure Laterality Date  . CARDIAC CATHETERIZATION N/A 09/05/2015   Procedure: Left Heart Cath and Coronary Angiography;  Surgeon: Troy Sine, MD;  Location: [facility];  Service: Cardiovascular;  Laterality: N/A;  . CARDIAC CATHETERIZATION N/A 09/05/2015   Procedure: Coronary Stent Intervention;  Surgeon: Troy Sine, MD;  Location: [facility];  Service: Cardiovascular;  Laterality: N/A;  . 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 PERIPHERAL VASCULAR CATHETERIZATION Bilateral 2003   "stenting"  . TONSILLECTOMY  ~ 1963     There is no immunization history on file for this patient.  Current Meds  Medication Sig  . aspirin 81 MG tablet Take 81 mg by mouth daily.   . carvedilol (COREG) 6.25 MG tablet Take 1 tablet (6.25 mg total) by mouth 2 (two) times daily with a meal.  . cetirizine (ZYRTEC) 10 MG tablet Take 1 tablet (10 mg total) by mouth daily.  . clopidogrel (PLAVIX) 75 MG tablet TAKE 1 TABLET BY MOUTH ONCE DAILY  . glimepiride (AMARYL) 4 MG tablet TAKE 1 TABLET BY MOUTH ONCE DAILY  . ipratropium (ATROVENT) 0.03 % nasal spray Place 2 sprays into both nostrils 2 (two) times daily.  [name] lactulose (CHRONULAC) 10 GM/15ML solution Take 30 mLs (20 g total) by mouth 2 (two) times daily as needed for moderate constipation.  [name] lisinopril (PRINIVIL,ZESTRIL) 10 MG tablet TAKE 1 TABLET BY MOUTH EVERY DAY  . metFORMIN (GLUCOPHAGE) 1000 MG tablet Take 1 tablet (1,000 mg total) by mouth 2 (two) times daily with a meal.  . nitroGLYCERIN (NITROSTAT) 0.4 MG SL tablet Place 1 tablet (0.4 mg total) under the tongue every 5 (five) minutes x 3 doses as needed for chest pain.  . rosuvastatin (CRESTOR) 40 MG tablet Take 1 tablet (40 mg total) by mouth daily.    BP 140/68 (BP Location: Left Arm, Patient Position: Sitting, Cuff Size: Large)   Pulse 80   Temp 98.1 F (36.7 C) (Oral)   Ht 6\' 3"  (1.905 m)   Wt 229 lb (103.9 kg)   SpO2 100%   BMI 28.62 kg/m   Review of Systems  Constitutional: Negative.   HENT: Negative.   Eyes: Negative.   Respiratory: Negative.   Cardiovascular: Negative.   Gastrointestinal: Negative.   Endocrine: Negative.   Genitourinary: Negative.   Musculoskeletal: Negative.   Skin: Negative.   Allergic/Immunologic: Negative.   Neurological: Negative.   Hematological: Negative.   Psychiatric/Behavioral: Negative.    Objective:   Physical Exam  Constitutional: He is oriented to person, place, and time. He appears well-developed and well-nourished.  HENT:  Head: Normocephalic and atraumatic.  Right Ear: External ear normal.  Left Ear: External ear normal.  Nose: Nose normal.  Mouth/Throat: Oropharynx is clear and moist.  Eyes: Pupils  are equal, round, and reactive to light. Conjunctivae and EOM are normal.  Neck: Normal range of motion. Neck supple.  Cardiovascular: Normal rate, regular rhythm, normal heart sounds and intact distal pulses.  Pulmonary/Chest: Effort normal and breath sounds normal.  Abdominal: Soft. Bowel sounds are normal.  Musculoskeletal: Normal range of motion.  Neurological: He is alert and oriented to person, place, and time.  Skin: Skin is warm and dry. Capillary refill  takes less than 2 seconds.  Psychiatric: He has a normal mood and affect. His behavior is normal. Judgment and thought content normal.  Nursing note and vitals reviewed.  Assessment & Plan:   1. Type 2 diabetes mellitus without complication, without long-term current use of insulin (HCC) Improved. IHgb A1c has decrease to 7.3 today, from 8.3 on 07/10/2017. He will continue to decrease foods/beverages high in sugars and carbs and follow Heart Healthy or DASH diet. Increase physical activity to at least 30 minutes cardio exercise daily.   - POCT glycosylated hemoglobin (Hb A1C) - POCT urinalysis dipstick  2. Essential hypertension Blood pressure stable at 140/68 today. He will continue Carvidilol, Plavix, and Lisinopril. She will continue to decrease high sodium intake, excessive alcohol intake, increase potassium intake, smoking cessation, and increase physical activity of at least 30 minutes of cardio activity daily. He will continue to follow Heart Healthy or DASH diet.   3. Abnormal urinalysis - Urine Culture  4. Follow up He will follow up in 6 months.   No orders of the defined types were placed in this encounter.  Kathe Becton,  MSN, FNP-C Patient [facility] [address] [place], [place] [address] [phone]

## 2018-01-07 LAB — URINE CULTURE: Organism ID, Bacteria: NO GROWTH

## 2018-02-04 ENCOUNTER — Other Ambulatory Visit: Payer: Self-pay

## 2018-02-04 DIAGNOSIS — I10 Essential (primary) hypertension: Secondary | ICD-10-CM

## 2018-02-04 MED ORDER — LISINOPRIL 10 MG PO TABS: 10.0000 mg | ORAL_TABLET | Freq: Every day | ORAL | 0 refills | Status: DC

## 2018-02-04 NOTE — Telephone Encounter (Signed)
Medication sent.

## 2018-03-03 ENCOUNTER — Other Ambulatory Visit: Payer: Self-pay | Admitting: Physician Assistant

## 2018-03-03 DIAGNOSIS — I10 Essential (primary) hypertension: Secondary | ICD-10-CM

## 2018-03-03 DIAGNOSIS — I2510 Atherosclerotic heart disease of native coronary artery without angina pectoris: Secondary | ICD-10-CM

## 2018-03-03 DIAGNOSIS — I517 Cardiomegaly: Secondary | ICD-10-CM

## 2018-04-07 ENCOUNTER — Telehealth: Payer: Self-pay

## 2018-04-07 NOTE — Telephone Encounter (Signed)
Called and spoke with patient, He was calling to see when last eye referral was done. I advised him we sent referral in 06/2017. Patient verbalized understanding. Thanks!

## 2018-05-11 ENCOUNTER — Other Ambulatory Visit: Payer: Self-pay | Admitting: Family Medicine

## 2018-05-11 DIAGNOSIS — I10 Essential (primary) hypertension: Secondary | ICD-10-CM

## 2018-05-14 ENCOUNTER — Other Ambulatory Visit: Payer: Self-pay

## 2018-05-14 DIAGNOSIS — I10 Essential (primary) hypertension: Secondary | ICD-10-CM

## 2018-05-14 MED ORDER — LISINOPRIL 10 MG PO TABS: 10.0000 mg | ORAL_TABLET | Freq: Every day | ORAL | 0 refills | Status: DC

## 2018-05-14 MED ORDER — GLIMEPIRIDE 4 MG PO TABS: 4.0000 mg | ORAL_TABLET | Freq: Every day | ORAL | 0 refills | Status: DC

## 2018-05-14 NOTE — Telephone Encounter (Signed)
Medication sent to pharmacy  

## 2018-06-03 ENCOUNTER — Other Ambulatory Visit: Payer: Self-pay | Admitting: Family Medicine

## 2018-07-02 ENCOUNTER — Encounter: Payer: Self-pay | Admitting: Family Medicine

## 2018-07-02 ENCOUNTER — Ambulatory Visit (INDEPENDENT_AMBULATORY_CARE_PROVIDER_SITE_OTHER): Payer: BLUE CROSS/BLUE SHIELD | Admitting: Family Medicine

## 2018-07-02 ENCOUNTER — Ambulatory Visit (HOSPITAL_COMMUNITY)
Admission: RE | Admit: 2018-07-02 | Discharge: 2018-07-02 | Disposition: A | Discharge status: Home / Self Care | Payer: BLUE CROSS/BLUE SHIELD | Source: Ambulatory Visit | Attending: Family Medicine | Admitting: Family Medicine

## 2018-07-02 ENCOUNTER — Other Ambulatory Visit: Payer: Self-pay | Admitting: Family Medicine

## 2018-07-02 VITALS — BP 126/48 | Temp 103.0°F | Resp 14 | Ht 75.0 in | Wt 230.0 lb

## 2018-07-02 DIAGNOSIS — R509 Fever, unspecified: Secondary | ICD-10-CM | POA: Insufficient documentation

## 2018-07-02 DIAGNOSIS — E1151 Type 2 diabetes mellitus with diabetic peripheral angiopathy without gangrene: Secondary | ICD-10-CM

## 2018-07-02 DIAGNOSIS — I739 Peripheral vascular disease, unspecified: Secondary | ICD-10-CM | POA: Diagnosis not present

## 2018-07-02 LAB — POC INFLUENZA A&B (BINAX/QUICKVUE)
Influenza A, POC: NEGATIVE
Influenza B, POC: NEGATIVE

## 2018-07-02 MED ORDER — AMOXICILLIN-POT CLAVULANATE 875-125 MG PO TABS: 1.0000 | ORAL_TABLET | Freq: Two times a day (BID) | ORAL | 0 refills | Status: AC

## 2018-07-02 MED ORDER — OSELTAMIVIR PHOSPHATE 75 MG PO CAPS: 75.0000 mg | ORAL_CAPSULE | Freq: Two times a day (BID) | ORAL | 0 refills | Status: AC

## 2018-07-02 NOTE — Progress Notes (Signed)
Please let him know that there was no signs of pneumonia. He can continue with all medications as previously ordered.

## 2018-07-02 NOTE — Progress Notes (Signed)
Patient Kirk [name]   Progress Note: Sick Visit Provider: [name], FNP  SUBJECTIVE:   Kirk [name] is a 63 y.o. male who  has a past medical history of Arthritis, HTN (hypertension), Hyperlipidemia, NSTEMI (non-ST elevated myocardial infarction) ([place]) (09/04/2015), PAD (peripheral artery disease) ([facility] [place]), Refusal of blood transfusions as patient is Jehovah's Witness, and Type II diabetes mellitus ([place]).. Patient presents today for Fatigue; Fever; and Cough  Fever   This is a new problem. The current episode started in the past 7 days. The problem occurs constantly. The problem has been unchanged. The maximum temperature noted was 102 to 102.9 F. The temperature was taken using an oral thermometer. Associated symptoms include congestion, coughing, headaches, muscle aches and a sore throat. Pertinent negatives include no chest pain. He has tried nothing for the symptoms.  Risk factors: sick contacts   Risk factors: no recent travel   Cough  This is a new problem. The current episode started in the past 7 days. The problem has been unchanged. The problem occurs every few minutes. The cough is non-productive. Associated symptoms include chills, a fever, headaches and a sore throat. Pertinent negatives include no chest pain. Nothing aggravates the symptoms. He has tried nothing for the symptoms. His past medical history is significant for environmental allergies.    Review of Systems  Constitutional: Positive for chills and fever.  HENT: Positive for congestion and sore throat.   Respiratory: Positive for cough.   Cardiovascular: Negative for chest pain.  Neurological: Positive for headaches.  Endo/Heme/Allergies: Positive for environmental allergies.  All other systems reviewed and are negative.    OBJECTIVE: BP (!) 126/48 (BP Location: Left Arm, Patient Position: Sitting, Cuff Size: Large)   Temp (!) 103 F (39.4 C) (Oral)   Resp 14   Ht 6'  3" (1.905 m)   Wt 230 lb (104.3 kg)   SpO2 94%   BMI 28.75 kg/m   Wt Readings from Last 3 Encounters:  07/02/18 230 lb (104.3 kg)  01/05/18 229 lb (103.9 kg)  12/03/17 225 lb (102.1 kg)     Physical Exam Vitals signs and nursing note reviewed.  Constitutional:      General: He is not in acute distress.    Appearance: He is well-developed. He is ill-appearing.  HENT:     Head: Normocephalic and atraumatic.     Right Ear: Tympanic membrane normal.     Left Ear: Tympanic membrane normal.     Nose: Congestion present.     Mouth/Throat:     Pharynx: No oropharyngeal exudate or posterior oropharyngeal erythema.  Eyes:     Conjunctiva/sclera: Conjunctivae normal.     Pupils: Pupils are equal, round, and reactive to light.  Neck:     Musculoskeletal: Normal range of motion. Muscular tenderness present.  Cardiovascular:     Rate and Rhythm: Normal rate and regular rhythm.     Heart sounds: Normal heart sounds.  Pulmonary:     Effort: Pulmonary effort is normal. No respiratory distress.     Breath sounds: Wheezing present.  Abdominal:     General: Bowel sounds are normal. There is no distension.     Palpations: Abdomen is soft.  Musculoskeletal: Normal range of motion.  Lymphadenopathy:     Cervical: Cervical adenopathy present.  Skin:    General: Skin is warm and dry.  Neurological:     Mental Status: He is alert and oriented to person, place, and time.  Psychiatric:        Mood and Affect: Mood normal.        Behavior: Behavior normal.        Thought Content: Thought content normal.        Judgment: Judgment normal.     ASSESSMENT/PLAN:  1. Fever, unspecified fever cause Flu tests negative. Due to unstable gait and fever, patient advised to go to the ED for further evaluation and re-hydration. Patient refused. Instructed patient to monitor fever. Comfort measures to help with symptoms of URI.  Increase fluids and rest. Tylenol or Motrin for fever, chills, and body  aches (if not contraindicated). Nasal irrigation, humidifier, lozenges, vapor rub, warm fluids (soups, herbal teas with honey and lemon) and things that make you feel better.  - POC Influenza A&B (Binax test) - amoxicillin-clavulanate (AUGMENTIN) 875-125 MG tablet; Take 1 tablet by mouth every 12 (twelve) hours for 7 days.  Dispense: 14 tablet; Refill: 0 - DG Chest 2 View; Future  Started on Tamiflu due to flu-like symptoms. Discussed that this medication has benefits and side effects.    Patient with fever and unstable gait. Advised patient to be taken to the ED via ambulance. Patient refused.    The patient was given clear instructions to go to ER or return to medical center if symptoms do not improve, worsen or new problems develop. The patient verbalized understanding and agreed with plan of [name].   [name]. Nathaneil Canary, FNP-BC Patient Kirk [name] [address] [place], [place] [address] [phone]     This note has been created with Dragon speech recognition software and smart phrase technology. Any transcriptional errors are unintentional.

## 2018-07-02 NOTE — Patient Instructions (Signed)
Comfort measures to help with symptoms of Viral URI. I started an antibiotic because of your health history and chest congestion. I will call if you need to stop it.  Increase fluids and rest. Tylenol or Motrin for fever, chills, and body aches (if not contraindicated). Nasal irrigation, humidifier, lozenges, vapor rub, warm fluids (soups, herbal teas with honey and lemon) and things that make you feel better.     Fever, Adult     A fever is an increase in your body's temperature. It often means a temperature of 100.12F (38C) or higher. Brief mild or moderate fevers often have no long-term effects. They often do not need treatment. Moderate or high fevers may make you feel uncomfortable. Sometimes, they can be a sign of a serious illness or disease. A fever that keeps coming back or that lasts a long time may cause you to lose water in your body (get dehydrated). You can take your temperature with a thermometer to see if you have a fever. Temperature can change with:  Age.  Time of day.  Where the thermometer is put in the body. Readings may vary when the thermometer is put: ? In the mouth (oral). ? In the butt (rectal). ? In the ear (tympanic). ? Under the arm (axillary). ? On the forehead (temporal). Follow these instructions at home: Medicines  Take over-the-counter and prescription medicines only as told by your doctor. Follow the dosing instructions carefully.  If you were prescribed an antibiotic medicine, take it as told by your doctor. Do not stop taking it even if you start to feel better. General instructions  Watch for any changes in your symptoms. Tell your doctor about them.  Rest as needed.  Drink enough fluid to keep your pee (urine) pale yellow.  Sponge yourself or bathe with room-temperature water as needed. This helps to lower your body temperature. Do not use ice water.  Do not use too many blankets or wear clothes that are too heavy.  If your fever was  caused by an infection that spreads from person to person (is contagious), such as a cold or the flu: ? You should stay home from work and public places for at least 24 hours after your fever is gone. ? Your fever should be gone for at least 24 hours without the need to use medicines. Contact a doctor if:  You throw up (vomit).  You cannot eat or drink without throwing up.  You have watery poop (diarrhea).  It hurts when you pee.  Your symptoms do not get better with treatment.  You have new symptoms.  You feel very weak. Get help right away if:  You are short of breath or have trouble breathing.  You are dizzy or you pass out (faint).  You feel mixed up (confused).  You have signs of not having enough water in your body, such as: ? Dark pee, very little pee, or no pee. ? Cracked lips. ? Dry mouth. ? Sunken eyes. ? Sleepiness. ? Weakness.  You have very bad pain in your belly (abdomen).  You keep throwing up or having watery poop.  You have a rash on your skin.  Your symptoms get worse all of a sudden. Summary  A fever is an increase in your body's temperature. It often means a temperature of 100.12F (38C) or higher.  Watch for any changes in your symptoms. Tell your doctor about them.  Take all medicines only as told by your doctor.  Do  not go to work or other public places if your fever was caused by an illness that can spread to other people.  Get help right away if you have signs that you do not have enough water in your body. This information is not intended to replace advice given to you by your health care provider. Make sure you discuss any questions you have with your health care provider. Document Released: 02/20/2008 Document Revised: 10/27/2017 Document Reviewed: 10/27/2017 Elsevier Interactive Patient Education  2019 [employer].

## 2018-07-03 ENCOUNTER — Telehealth: Payer: Self-pay

## 2018-07-03 NOTE — Telephone Encounter (Signed)
Called, spoke with patient, advised that xray showed no signs of pneumonia and that he needs to continue with all medications. Thanks!

## 2018-07-03 NOTE — Telephone Encounter (Signed)
-----   Message from [name], [place] sent at 07/02/2018  5:11 PM EST ----- Please let him know that there was no signs of pneumonia. He can continue with all medications as previously ordered.

## 2018-07-06 ENCOUNTER — Telehealth: Payer: Self-pay

## 2018-07-06 NOTE — Telephone Encounter (Signed)
Patient called and states he feels a little better but does not have an appetite. He says he has stopped taking his antibiotic due to upset stomach. He says he was not eating when taking medication. I spoke with NP [name] regarding this. She advised that he should try to finnish the antibiotic and to stay hydrated and that he could come back in if needed as long as he was peeing and pooping ok. I spoke with patient he states he is not having any problems with peeing or pooping and he will try to hydrate and drink some chicken broth and take the medications but will call back if he needs to come in. Thanks!

## 2018-07-08 ENCOUNTER — Encounter: Payer: Self-pay | Admitting: Family Medicine

## 2018-07-08 ENCOUNTER — Ambulatory Visit (INDEPENDENT_AMBULATORY_CARE_PROVIDER_SITE_OTHER): Payer: BLUE CROSS/BLUE SHIELD | Admitting: Family Medicine

## 2018-07-08 VITALS — BP 126/60 | HR 74 | Temp 98.3°F | Ht 75.0 in | Wt 229.8 lb

## 2018-07-08 DIAGNOSIS — J302 Other seasonal allergic rhinitis: Secondary | ICD-10-CM

## 2018-07-08 DIAGNOSIS — R0989 Other specified symptoms and signs involving the circulatory and respiratory systems: Secondary | ICD-10-CM

## 2018-07-08 DIAGNOSIS — R05 Cough: Secondary | ICD-10-CM | POA: Diagnosis not present

## 2018-07-08 DIAGNOSIS — E119 Type 2 diabetes mellitus without complications: Secondary | ICD-10-CM

## 2018-07-08 DIAGNOSIS — Z09 Encounter for follow-up examination after completed treatment for conditions other than malignant neoplasm: Secondary | ICD-10-CM

## 2018-07-08 DIAGNOSIS — R059 Cough, unspecified: Secondary | ICD-10-CM

## 2018-07-08 LAB — POCT URINALYSIS DIP (MANUAL ENTRY)
Bilirubin, UA: NEGATIVE
Blood, UA: NEGATIVE
Glucose, UA: 100 mg/dL — AB
Ketones, POC UA: NEGATIVE mg/dL
Leukocytes, UA: NEGATIVE
Nitrite, UA: NEGATIVE
Protein Ur, POC: 30 mg/dL — AB
Spec Grav, UA: 1.025 (ref 1.010–1.025)
Urobilinogen, UA: 2 E.U./dL — AB
pH, UA: 5.5 (ref 5.0–8.0)

## 2018-07-08 LAB — POCT GLYCOSYLATED HEMOGLOBIN (HGB A1C): Hemoglobin A1C: 7.2 % — AB (ref 4.0–5.6)

## 2018-07-08 MED ORDER — CETIRIZINE HCL 10 MG PO TABS: 10.0000 mg | ORAL_TABLET | Freq: Every day | ORAL | 11 refills | Status: DC

## 2018-07-08 MED ORDER — GUAIFENESIN ER 600 MG PO TB12: 600.0000 mg | ORAL_TABLET | Freq: Two times a day (BID) | ORAL | 0 refills | Status: DC

## 2018-07-08 NOTE — Patient Instructions (Signed)
Viral Respiratory Infection A respiratory infection is an illness that affects part of the respiratory system, such as the lungs, nose, or throat. A respiratory infection that is caused by a virus is called a viral respiratory infection. Common types of viral respiratory infections include:  A cold.  The flu (influenza).  A respiratory syncytial virus (RSV) infection. What are the causes? This condition is caused by a virus. What are the signs or symptoms? Symptoms of this condition include:  A stuffy or runny nose.  Yellow or green nasal discharge.  A cough.  Sneezing.  Fatigue.  Achy muscles.  A sore throat.  Sweating or chills.  A fever.  A headache. How is this diagnosed? This condition may be diagnosed based on:  Your symptoms.  A physical exam.  Testing of nasal swabs. How is this treated? This condition may be treated with medicines, such as:  Antiviral medicine. This may shorten the length of time a person has symptoms.  Expectorants. These make it easier to cough up mucus.  Decongestant nasal sprays.  Acetaminophen or NSAIDs to relieve fever and pain. Antibiotic medicines are not prescribed for viral infections. This is because antibiotics are designed to kill bacteria. They are not effective against viruses. Follow these instructions at home:  Managing pain and congestion  Take over-the-counter and prescription medicines only as told by your health care provider.  If you have a sore throat, gargle with a salt-water mixture 3-4 times a day or as needed. To make a salt-water mixture, completely dissolve -1 tsp of salt in 1 cup of warm water.  Use nose drops made from salt water to ease congestion and soften raw skin around your nose.  Drink enough fluid to keep your urine pale yellow. This helps prevent dehydration and helps loosen up mucus. General instructions  Rest as much as possible.  Do not drink alcohol.  Do not use any products  that contain nicotine or tobacco, such as cigarettes and e-cigarettes. If you need help quitting, ask your health care provider.  Keep all follow-up visits as told by your health care provider. This is important. How is this prevented?   Get an annual flu shot. You may get the flu shot in late summer, fall, or winter. Ask your health care provider when you should get your flu shot.  Avoid exposing others to your respiratory infection. ? Stay home from work or school as told by your health care provider. ? Wash your hands with soap and water often, especially after you cough or sneeze. If soap and water are not available, use alcohol-based hand sanitizer.  Avoid contact with people who are sick during cold and flu season. This is generally fall and winter. Contact a health care provider if:  Your symptoms last for 10 days or longer.  Your symptoms get worse over time.  You have a fever.  You have severe sinus pain in your face or forehead.  The glands in your jaw or neck become very swollen. Get help right away if you:  Feel pain or pressure in your chest.  Have shortness of breath.  Faint or feel like you will faint.  Have severe and persistent vomiting.  Feel confused or disoriented. Summary  A respiratory infection is an illness that affects part of the respiratory system, such as the lungs, nose, or throat. A respiratory infection that is caused by a virus is called a viral respiratory infection.  Common types of viral respiratory infections are a  cold, influenza, and respiratory syncytial virus (RSV) infection.  Symptoms of this condition include a stuffy or runny nose, cough, sneezing, fatigue, achy muscles, sore throat, and fevers or chills.  Antibiotic medicines are not prescribed for viral infections. This is because antibiotics are designed to kill bacteria. They are not effective against viruses. This information is not intended to replace advice given to you by  your health care provider. Make sure you discuss any questions you have with your health care provider. Document Released: 02/20/2005 Document Revised: 06/23/2017 Document Reviewed: 06/23/2017 Elsevier Interactive Patient Education  2019 [place] [facility] oral ER tablets What is this medicine? GUAIFENESIN (gwye FEN e sin) is an expectorant. It helps to thin mucous and make coughs more productive. This medicine is used to treat coughs caused by colds or the flu. It is not intended to treat chronic cough caused by smoking, asthma, emphysema, or heart failure. This medicine may be used for other purposes; ask your health care provider or pharmacist if you have questions. COMMON BRAND NAME(S): Humibid, Mucinex What should I tell my health care provider before I take this medicine? They need to know if you have any of these conditions: -fever -kidney disease -an unusual or allergic reaction to guaifenesin, other medicines, foods, dyes, or preservatives -pregnant or trying to get pregnant -breast-feeding How should I use this medicine? Take this medicine by mouth with a full glass of water. Follow the directions on the prescription label. Do not break, chew or crush this medicine. You may take with food or on an empty stomach. Take your medicine at regular intervals. Do not take your medicine more often than directed. Talk to your pediatrician regarding the use of this medicine in children. While this drug may be prescribed for children as young as 74 years old for selected conditions, precautions do apply. Overdosage: If you think you have taken too much of this medicine contact a poison control center or emergency room at once. NOTE: This medicine is only for you. Do not share this medicine with others. What if I miss a dose? If you miss a dose, take it as soon as you can. If it is almost time for your next dose, take only that dose. Do not take double or extra doses. What may interact  with this medicine? Interactions are not expected. This list may not describe all possible interactions. Give your health care provider a list of all the medicines, herbs, non-prescription drugs, or dietary supplements you use. Also tell them if you smoke, drink alcohol, or use illegal drugs. Some items may interact with your medicine. What should I watch for while using this medicine? Do not treat a cough for more than 1 week without consulting your doctor or health care professional. If you also have a high fever, skin rash, continuing headache, or sore throat, see your doctor. For best results, drink 6 to 8 glasses water daily while you are taking this medicine. What side effects may I notice from receiving this medicine? Side effects that you should report to your doctor or health care professional as soon as possible: -allergic reactions like skin rash, itching or hives, swelling of the face, lips, or tongue Side effects that usually do not require medical attention (report to your doctor or health care professional if they continue or are bothersome): -dizziness -headache -stomach upset This list may not describe all possible side effects. Call your doctor for medical advice about side effects. You may report side effects to  FDA at 1-800-FDA-1088. Where should I keep my medicine? Keep out of the reach of children. Store at room temperature between 20 and 25 degrees C (68 and 77 degrees F). Keep container tightly closed. Throw away any unused medicine after the expiration date. NOTE: This sheet is a summary. It may not cover all possible information. If you have questions about this medicine, talk to your doctor, pharmacist, or health care provider.  2019 Elsevier/Gold Standard (2007-09-23 12:14:14)

## 2018-07-08 NOTE — Progress Notes (Signed)
Patient [name]  Established Patient Office Visit  Subjective:  Patient ID: Kirk [name], male    DOB: 10-02-1955  Age: 63 y.o. MRN: [id number]  CC:  Chief Complaint  Patient presents with  . Follow-up    chronic condiiton     HPI Kirk [name] is a 63 year old male who presents for follow up.   Past Medical History:  Diagnosis Date  . Arthritis    "right knee" (09/04/2015)  . HTN (hypertension)   . Hyperlipidemia   . NSTEMI (non-ST elevated myocardial infarction) ([facility]) 09/04/2015   a. cath 09/05/2015: 95% stenosis mid-LAD, 55% mid-RCA, and 40% prox-RCA. PCI performed w/ Synergy DES to LAD  . PAD (peripheral artery disease) (HCC)    Stenting of bilateral iliacs in 2003.  [name] Refusal of blood transfusions as patient is Jehovah's Witness   . Type II diabetes mellitus ([place])     Current Status: Since his last office visit, he continues to have cough, congestion and shortness of breath. He continues to take antibiotics. He states that he is feeling better.   He denies fevers, chills, fatigue, recent infections, weight loss, and night sweats. She has not had any headaches, visual changes, dizziness, and falls. No chest pain, and heart palpitations reported. No reports of GI problems such as nausea, vomiting, diarrhea, and constipation. She has no reports of blood in stools, dysuria and hematuria. No depression or anxiety reported. He denies pain today.   Past Surgical History:  Procedure Laterality Date  . CARDIAC CATHETERIZATION N/A 09/05/2015   Procedure: Left Heart Cath and Coronary Angiography;  Surgeon: Troy Sine, MD;  Location: [facility];  Service: Cardiovascular;  Laterality: N/A;  . CARDIAC CATHETERIZATION N/A 09/05/2015   Procedure: Coronary Stent Intervention;  Surgeon: Troy Sine, MD;  Location: [facility];  Service: Cardiovascular;  Laterality: N/A;  . 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 PERIPHERAL VASCULAR CATHETERIZATION Bilateral 2003   "stenting"  . TONSILLECTOMY  ~ 1963    Family History  Problem Relation Age of Onset  . Lymphoma Mother   . Cancer - Prostate Father   . CAD Neg Hx     Social History   Socioeconomic History  . Marital status: Divorced    Spouse name: Not on file  . Number of children: 1  . Years of education: Not on file  . Highest education level: Not on file  Occupational History  . Occupation: Acupuncturist  Social Needs  . Financial resource strain: Not on file  . Food insecurity:    Worry: Not on file    Inability: Not on file  . Transportation needs:    Medical: Not on file    Non-medical: Not on file  Tobacco Use  . Smoking status: Former Smoker    Packs/day: 2.00    Years: 35.00    Pack years: 70.00    Types: Cigarettes    Last attempt to quit: 02/19/2003    Years since quitting: 15.3  . Smokeless tobacco: Never Used  Substance and Sexual Activity  . Alcohol use: Yes    Alcohol/week: 0.0 standard drinks    Comment: 09/04/2015 "nothing since 1980s"  . Drug use: Yes    Types: Marijuana    Comment: "smoked reef in [place] 1970s"  . Sexual activity: Not on file  Lifestyle  . Physical activity:    Days  per week: Not on file    Minutes per session: Not on file  . Stress: Not on file  Relationships  . Social connections:    Talks on phone: Not on file    Gets together: Not on file    Attends religious service: Not on file    Active member of club or organization: Not on file    Attends meetings of clubs or organizations: Not on file    Relationship status: Not on file  . Intimate partner violence:    Fear of current or ex partner: Not on file    Emotionally abused: Not on file    Physically abused: Not on file    Forced sexual activity: Not on file  Other Topics Concern  . Not on file  Social History Narrative  . Not on file    Outpatient Medications Prior to Visit  Medication Sig  Dispense Refill  . amoxicillin-clavulanate (AUGMENTIN) 875-125 MG tablet Take 1 tablet by mouth every 12 (twelve) hours for 7 days. 14 tablet 0  . aspirin 81 MG tablet Take 81 mg by mouth daily.    . carvedilol (COREG) 6.25 MG tablet TAKE 1 TABLET BY MOUTH TWICE A DAY AFTER MEALS 60 tablet 6  . clopidogrel (PLAVIX) 75 MG tablet TAKE 1 TABLET BY MOUTH ONCE DAILY 30 tablet 10  . glimepiride (AMARYL) 4 MG tablet Take 1 tablet (4 mg total) by mouth daily. 90 tablet 0  . ipratropium (ATROVENT) 0.03 % nasal spray Place 2 sprays into both nostrils 2 (two) times daily. 30 mL 2  . lactulose (CHRONULAC) 10 GM/15ML solution Take 30 mLs (20 g total) by mouth 2 (two) times daily as needed for moderate constipation. 240 mL 0  . lisinopril (PRINIVIL,ZESTRIL) 10 MG tablet Take 1 tablet (10 mg total) by mouth daily. 90 tablet 0  . metFORMIN (GLUCOPHAGE) 1000 MG tablet TAKE 1 TABLET BY MOUTH TWICE A DAY WITH MEALS 180 tablet 2  . nitroGLYCERIN (NITROSTAT) 0.4 MG SL tablet Place 1 tablet (0.4 mg total) under [place] tongue every 5 (five) minutes x 3 doses as needed for chest pain. 25 tablet 2  . rosuvastatin (CRESTOR) 40 MG tablet Take 1 tablet (40 mg total) by mouth daily. 90 tablet 2  . cetirizine (ZYRTEC) 10 MG tablet Take 1 tablet (10 mg total) by mouth daily. 30 tablet 11   No facility-administered medications prior to visit.     No Known Allergies  ROS Review of Systems  Constitutional: Negative.   HENT: Negative.   Eyes: Negative.   Respiratory: Negative.   Cardiovascular: Negative.   Gastrointestinal: Negative.   Endocrine: Negative.   Genitourinary: Negative.   Musculoskeletal: Negative.   Skin: Negative.   Allergic/Immunologic: Negative.   Neurological: Negative.   Hematological: Negative.   Psychiatric/Behavioral: Negative.    Objective:    Physical Exam  Constitutional: He is oriented to person, place, and time. He appears well-developed and well-nourished.  HENT:  Head: Normocephalic  and atraumatic.  Eyes: Conjunctivae are normal.  Neck: Normal range of motion. Neck supple.  Cardiovascular: Normal rate, regular rhythm, normal heart sounds and intact distal pulses.  Pulmonary/Chest: Effort normal and breath sounds normal.  Abdominal: Soft. Bowel sounds are normal.  Musculoskeletal: Normal range of motion.  Neurological: He is alert and oriented to person, place, and time. He has normal reflexes.  Skin: Skin is warm and dry.  Psychiatric: He has a normal mood and affect. His behavior is normal. Judgment and thought  content normal.  Nursing note and vitals reviewed.   BP 126/60 (BP Location: Left Arm, Patient Position: Sitting, Cuff Size: Large)   Pulse 74   Temp 98.3 F (36.8 C) (Oral)   Ht 6\' 3"  (1.905 m)   Wt 229 lb 12.8 oz (104.2 kg)   SpO2 100%   BMI 28.72 kg/m  Wt Readings from Last 3 Encounters:  07/08/18 229 lb 12.8 oz (104.2 kg)  07/02/18 230 lb (104.3 kg)  01/05/18 229 lb (103.9 kg)     Health Maintenance Due  Topic Date Due  . COLONOSCOPY  01/30/2004  . FOOT EXAM  07/02/2017    There are no preventive care reminders to display for this patient.  Lab Results  Component Value Date   TSH 1.083 09/04/2015   Lab Results  Component Value Date   WBC 6.1 10/01/2016   HGB 13.1 (L) 10/01/2016   HCT 40.7 10/01/2016   MCV 89.3 10/01/2016   PLT 268 10/01/2016   Lab Results  Component Value Date   NA 140 10/01/2016   K 4.8 10/01/2016   CO2 23 10/01/2016   GLUCOSE 183 (H) 10/01/2016   BUN 19 10/01/2016   CREATININE 1.06 10/01/2016   BILITOT 0.3 08/26/2017   ALKPHOS 62 08/26/2017   AST 18 08/26/2017   ALT 20 08/26/2017   PROT 7.3 08/26/2017   ALBUMIN 4.5 08/26/2017   CALCIUM 9.5 10/01/2016   ANIONGAP 12 09/06/2015   Lab Results  Component Value Date   CHOL 102 08/26/2017   Lab Results  Component Value Date   HDL 31 (L) 08/26/2017   Lab Results  Component Value Date   LDLCALC 54 08/26/2017   Lab Results  Component Value Date     TRIG 83 08/26/2017   Lab Results  Component Value Date   CHOLHDL 3.3 08/26/2017   Lab Results  Component Value Date   HGBA1C 7.2 (A) 07/08/2018   Assessment & Plan:   1. Type 2 diabetes mellitus without complication, without long-term current use of insulin (HCC) Hgb A1c is stable at 7.2 today. He will continue Amaryl and Metformin as prescribed. She will continue to decrease foods/beverages high in sugars and carbs and follow Heart Healthy or DASH diet. Increase physical activity to at least 30 minutes cardio exercise daily.  - POCT glycosylated hemoglobin (Hb A1C) - POCT urinalysis dipstick  2. Chest congestion - guaiFENesin (MUCINEX) 600 MG 12 hr tablet; Take 1 tablet (600 mg total) by mouth 2 (two) times daily.  Dispense: 30 tablet; Refill: 0  3. Cough - guaiFENesin (MUCINEX) 600 MG 12 hr tablet; Take 1 tablet (600 mg total) by mouth 2 (two) times daily.  Dispense: 30 tablet; Refill: 0  4. Seasonal allergies - cetirizine (ZYRTEC) 10 MG tablet; Take 1 tablet (10 mg total) by mouth daily.  Dispense: 30 tablet; Refill: 11  5. Follow up He will follow up in 6 months.   Meds ordered this encounter  Medications  . cetirizine (ZYRTEC) 10 MG tablet    Sig: Take 1 tablet (10 mg total) by mouth daily.    Dispense:  30 tablet    Refill:  11  . guaiFENesin (MUCINEX) 600 MG 12 hr tablet    Sig: Take 1 tablet (600 mg total) by mouth 2 (two) times daily.    Dispense:  30 tablet    Refill:  0   Orders Placed This Encounter  Procedures  . POCT glycosylated hemoglobin (Hb A1C)  . POCT urinalysis dipstick  Referral Orders  No referral(s) requested today   Kathe Becton,  MSN, FNP-C Patient [place] [address], [place] [address] [phone]   Problem List Items Addressed This Visit      Endocrine   Diabetes [facility]) - Primary   Relevant Orders   POCT glycosylated hemoglobin (Hb A1C) (Completed)   POCT urinalysis dipstick  (Completed)    Other Visit Diagnoses    Chest congestion       Relevant Medications   guaiFENesin (MUCINEX) 600 MG 12 hr tablet   Cough       Relevant Medications   guaiFENesin (MUCINEX) 600 MG 12 hr tablet   Seasonal allergies       Relevant Medications   cetirizine (ZYRTEC) 10 MG tablet   Follow up          Meds ordered this encounter  Medications  . cetirizine (ZYRTEC) 10 MG tablet    Sig: Take 1 tablet (10 mg total) by mouth daily.    Dispense:  30 tablet    Refill:  11  . guaiFENesin (MUCINEX) 600 MG 12 hr tablet    Sig: Take 1 tablet (600 mg total) by mouth 2 (two) times daily.    Dispense:  30 tablet    Refill:  0    Follow-up: Return in about 6 months (around 01/06/2019).    [name], FNP

## 2018-07-10 DIAGNOSIS — R0989 Other specified symptoms and signs involving the circulatory and respiratory systems: Secondary | ICD-10-CM | POA: Insufficient documentation

## 2018-07-10 DIAGNOSIS — R059 Cough, unspecified: Secondary | ICD-10-CM | POA: Insufficient documentation

## 2018-07-10 DIAGNOSIS — R05 Cough: Secondary | ICD-10-CM | POA: Insufficient documentation

## 2018-07-22 ENCOUNTER — Ambulatory Visit: Payer: BLUE CROSS/BLUE SHIELD | Admitting: Cardiovascular Disease

## 2018-07-22 ENCOUNTER — Encounter: Payer: Self-pay | Admitting: Cardiovascular Disease

## 2018-07-22 VITALS — BP 122/64 | HR 72 | Ht 75.0 in | Wt 233.4 lb

## 2018-07-22 DIAGNOSIS — I739 Peripheral vascular disease, unspecified: Secondary | ICD-10-CM | POA: Diagnosis not present

## 2018-07-22 DIAGNOSIS — E7800 Pure hypercholesterolemia, unspecified: Secondary | ICD-10-CM | POA: Diagnosis not present

## 2018-07-22 DIAGNOSIS — I10 Essential (primary) hypertension: Secondary | ICD-10-CM

## 2018-07-22 DIAGNOSIS — I2510 Atherosclerotic heart disease of native coronary artery without angina pectoris: Secondary | ICD-10-CM | POA: Diagnosis not present

## 2018-07-22 LAB — HEPATIC FUNCTION PANEL
ALBUMIN: 4.6 g/dL (ref 3.8–4.8)
ALT: 21 IU/L (ref 0–44)
AST: 18 IU/L (ref 0–40)
Alkaline Phosphatase: 57 IU/L (ref 39–117)
Bilirubin Total: 0.3 mg/dL (ref 0.0–1.2)
Bilirubin, Direct: 0.1 mg/dL (ref 0.00–0.40)
Total Protein: 7.5 g/dL (ref 6.0–8.5)

## 2018-07-22 LAB — LIPID PANEL
Chol/HDL Ratio: 3.6 ratio (ref 0.0–5.0)
Cholesterol, Total: 114 mg/dL (ref 100–199)
HDL: 32 mg/dL — ABNORMAL LOW (ref >39)
LDL Calculated: 60 mg/dL (ref 0–99)
Triglycerides: 111 mg/dL (ref 0–149)
VLDL Cholesterol Cal: 22 mg/dL (ref 5–40)

## 2018-07-22 NOTE — Patient Instructions (Signed)
Medication Instructions:  Your physician has recommended you make the following change in your medication: Stop aspirin  If you need a refill on your cardiac medications before your next appointment, please call your pharmacy.   Lab work: Lab work to be done today--Lipid and Liver profiles If you have labs (blood work) drawn today and your tests are completely normal, you will receive your results only by: [name] MyChart Message (if you have MyChart) OR . A paper copy in the mail If you have any lab test that is abnormal or we need to change your treatment, we will call you to review the results.  Testing/Procedures: Your physician has requested that you have an ankle brachial index (ABI). During this test an ultrasound and blood pressure cuff are used to evaluate the arteries that supply the arms and legs with blood. Allow thirty minutes for this exam. There are no restrictions or special instructions.    Follow-Up: At [facility], you and your health needs are our priority.  As part of our continuing mission to provide you with exceptional heart care, we have created designated Provider Care Teams.  These Care Teams include your primary Cardiologist (physician) and Advanced Practice Providers (APPs -  Physician Assistants and Nurse Practitioners) who all work together to provide you with the care you need, when you need it. You will need a follow up appointment in 12 months.  Please call our office 4 months in advance to schedule this appointment.  You may see Lauree Chandler, MD or one of the following Advanced Practice Providers on your designated Care Team:   Lloyd, PA-C Melina Copa, PA-C . Ermalinda Barrios, PA-C  Any Other Special Instructions Will Be Listed Below (If Applicable).

## 2018-07-22 NOTE — Progress Notes (Signed)
Chief Complaint  Patient presents with  . Follow-up    CAD   History of Present Illness: 63 yo male with history of CAD, PAD, HLD, HTN and DM who is here today for follow up. He has a history of PAD s/p bilateral lower extremity stenting in 2003. ABI March 2019 stable. He had his first cardiac event in April 2017 when he was admitted to [facility] with a NSTEMI. Cardiac cath 09/05/15 with high grade mid LAD stenosis treated with a drug eluting stent x 1. There was mild to moderate non-obstructive disease in the Circumflex and RCA. Echo April 2017 with normal LV systolic function. He is a former smoker. He was seen in our office April 2019 with c/o dyspnea. Nuclear stress test April 2019 with no ischemia.   He is here today for follow up. The patient denies any chest pain, dyspnea, palpitations, lower extremity edema, orthopnea, PND, dizziness, near syncope or syncope. Pain in legs is unchanged.   Primary Care Physician: [name], FNP  Past Medical History:  Diagnosis Date  . Arthritis    "right knee" (09/04/2015)  . HTN (hypertension)   . Hyperlipidemia   . NSTEMI (non-ST elevated myocardial infarction) ([place]) 09/04/2015   a. cath 09/05/2015: 95% stenosis mid-LAD, 55% mid-RCA, and 40% prox-RCA. PCI performed w/ Synergy DES to LAD  . PAD (peripheral artery disease) (HCC)    Stenting of bilateral iliacs in 2003.  [name] Refusal of blood transfusions as patient is Jehovah's Witness   . Type II diabetes mellitus ([place])     Past Surgical History:  Procedure Laterality Date  . CARDIAC CATHETERIZATION N/A 09/05/2015   Procedure: Left Heart Cath and Coronary Angiography;  Surgeon: Troy Sine, MD;  Location: [place] [facility];  Service: Cardiovascular;  Laterality: N/A;  . CARDIAC CATHETERIZATION N/A 09/05/2015   Procedure: Coronary Stent Intervention;  Surgeon: Troy Sine, MD;  Location: [place] [facility];  Service: Cardiovascular;  Laterality: N/A;  . ORIF CONGENITAL HIP DISLOCATION  Left ~ 1965   "put pins in it"  . PERIPHERAL VASCULAR CATHETERIZATION Bilateral 2003   "stenting"  . TONSILLECTOMY  ~ 1963    Current Outpatient Medications  Medication Sig Dispense Refill  . carvedilol (COREG) 6.25 MG tablet TAKE 1 TABLET BY MOUTH TWICE A DAY AFTER MEALS 60 tablet 6  . cetirizine (ZYRTEC) 10 MG tablet Take 1 tablet (10 mg total) by mouth daily. 30 tablet 11  . clopidogrel (PLAVIX) 75 MG tablet TAKE 1 TABLET BY MOUTH ONCE DAILY 30 tablet 10  . glimepiride (AMARYL) 4 MG tablet Take 1 tablet (4 mg total) by mouth daily. 90 tablet 0  . guaiFENesin (MUCINEX) 600 MG 12 hr tablet Take 1 tablet (600 mg total) by mouth 2 (two) times daily. 30 tablet 0  . ipratropium (ATROVENT) 0.03 % nasal spray Place 2 sprays into both nostrils 2 (two) times daily. 30 mL 2  . lactulose (CHRONULAC) 10 GM/15ML solution Take 30 mLs (20 g total) by mouth 2 (two) times daily as needed for moderate constipation. 240 mL 0  . lisinopril (PRINIVIL,ZESTRIL) 10 MG tablet Take 1 tablet (10 mg total) by mouth daily. 90 tablet 0  . metFORMIN (GLUCOPHAGE) 1000 MG tablet TAKE 1 TABLET BY MOUTH TWICE A DAY WITH MEALS 180 tablet 2  . nitroGLYCERIN (NITROSTAT) 0.4 MG SL tablet Place 1 tablet (0.4 mg total) under the tongue every 5 (five) minutes x 3 doses as needed for chest pain. 25  tablet 2  . rosuvastatin (CRESTOR) 40 MG tablet Take 1 tablet (40 mg total) by mouth daily. 90 tablet 2   No current facility-administered medications for this visit.     No Known Allergies  Social History   Socioeconomic History  . Marital status: Divorced    Spouse name: Not on file  . Number of children: 1  . Years of education: Not on file  . Highest education level: Not on file  Occupational History  . Occupation: Acupuncturist  Social Needs  . Financial resource strain: Not on file  . Food insecurity:    Worry: Not on file    Inability: Not on file  . Transportation needs:    Medical: Not on file     Non-medical: Not on file  Tobacco Use  . Smoking status: Former Smoker    Packs/day: 2.00    Years: 35.00    Pack years: 70.00    Types: Cigarettes    Last attempt to quit: 02/19/2003    Years since quitting: 15.4  . Smokeless tobacco: Never Used  Substance and Sexual Activity  . Alcohol use: Yes    Alcohol/week: 0.0 standard drinks    Comment: 09/04/2015 "nothing since 1980s"  . 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 Sexual activity: Not on file  Lifestyle  . Physical activity:    Days per week: Not on file    Minutes per session: Not on file  . Stress: Not on file  Relationships  . Social connections:    Talks on phone: Not on file    Gets together: Not on file    Attends religious service: Not on file    Active member of club or organization: Not on file    Attends meetings of clubs or organizations: Not on file    Relationship status: Not on file  . Intimate partner violence:    Fear of current or ex partner: Not on file    Emotionally abused: Not on file    Physically abused: Not on file    Forced sexual activity: Not on file  Other Topics Concern  . Not on file  Social History Narrative  . Not on file    Family History  Problem Relation Age of Onset  . Lymphoma Mother   . Cancer - Prostate Father   . CAD Neg Hx     Review of Systems:  As stated in the HPI and otherwise negative.   BP 122/64   Pulse 72   Ht 6\' 3"  (1.905 m)   Wt 233 lb 6.4 oz (105.9 kg)   SpO2 98%   BMI 29.17 kg/m   Physical Examination:  General: Well developed, well nourished, NAD  HEENT: OP clear, mucus membranes moist  SKIN: warm, dry. No rashes. Neuro: No focal deficits  Musculoskeletal: Muscle strength 5/5 all ext  Psychiatric: Mood and affect normal  Neck: No JVD, no carotid bruits, no thyromegaly, no lymphadenopathy.  Lungs:Clear bilaterally, no wheezes, rhonci, crackles Cardiovascular: Regular rate and rhythm. No murmurs, gallops or  rubs. Abdomen:Soft. Bowel sounds present. Non-tender.  Extremities: No lower extremity edema.   Echo 09/06/15: Left ventricle: The cavity size was normal. There was moderate   focal basal hypertrophy of the septum. Systolic function was   normal. The estimated ejection fraction was in the range of 60%   to 65%. Wall motion was normal; there were no regional wall  motion abnormalities. Doppler parameters are consistent with   abnormal left ventricular relaxation (grade 1 diastolic   dysfunction). The E/e&' ratio is between 8-15, suggesting   indeterminate LV filling pressure. - Aortic valve: Trileaflet. Cusp separation was normal. There was   no stenosis. Subvalvular LVOT acceleration is noted. There was no   regurgitation. - Mitral valve: SAM. Mildly thickened leaflets . There was trivial   regurgitation. - Left atrium: The atrium was normal in size. - Inferior vena cava: The vessel was normal in size. The   respirophasic diameter changes were in the normal range (>= 50%),   consistent with normal central venous pressure.  EKG:  EKG is ordered today. The ekg ordered today demonstrates Sinus rate 72 bpm. 1st degree AV block.   Recent Labs: 08/26/2017: ALT 20   Lipid Panel    Component Value Date/Time   CHOL 102 08/26/2017 0911   TRIG 83 08/26/2017 0911   HDL 31 (L) 08/26/2017 0911   CHOLHDL 3.3 08/26/2017 0911   CHOLHDL 3.7 03/12/2016 0837   VLDL 17 03/12/2016 0837   LDLCALC 54 08/26/2017 0911     Wt Readings from Last 3 Encounters:  07/22/18 233 lb 6.4 oz (105.9 kg)  07/08/18 229 lb 12.8 oz (104.2 kg)  07/02/18 230 lb (104.3 kg)     Other studies Reviewed: Additional studies/ records that were reviewed today include: . Review of the above records demonstrates:   Assessment and Plan:   1. CAD without angina: NSTEMI April 2017 with severe stenosis mid LAD treated with drug eluting stent. Mild to moderate non-obstrucitve disease in the RCA and Circumflex. He has no  chest pain. Will continue ASA, Plavix, statin and beta blocker. Will stop ASA.    2. HTN: BP is controlled.   3. HLD: Will continue the statin. Check lipids and LFTs today.   4. LVH: Subvalvular LVOT acceleration with SAM on echo. Continue beta blocker.   5. PAD: Pain in legs is unchanged. Repeat ABI now.   Current medicines are reviewed at length with the patient today.  The patient does not have concerns regarding medicines.  The following changes have been made:  no change  Labs/ tests ordered today include:   Orders Placed This Encounter  Procedures  . Lipid Profile  . Hepatic function panel  . EKG 12-Lead     Disposition:   FU with me in 12 months   Signed, Lauree Chandler, MD 07/22/2018 9:40 AM    [facility] [address], [place], [place]  [address] Phone: [phone]; Fax: [phone]

## 2018-08-15 ENCOUNTER — Other Ambulatory Visit: Payer: Self-pay | Admitting: Cardiovascular Disease

## 2018-08-26 ENCOUNTER — Other Ambulatory Visit: Payer: Self-pay | Admitting: Family Medicine

## 2018-08-26 ENCOUNTER — Inpatient Hospital Stay (HOSPITAL_COMMUNITY): Admission: RE | Admit: 2018-08-26 | Payer: BLUE CROSS/BLUE SHIELD | Source: Ambulatory Visit

## 2018-08-26 DIAGNOSIS — I10 Essential (primary) hypertension: Secondary | ICD-10-CM

## 2018-08-27 ENCOUNTER — Telehealth: Payer: Self-pay

## 2018-08-27 DIAGNOSIS — I10 Essential (primary) hypertension: Secondary | ICD-10-CM

## 2018-08-27 MED ORDER — GLIMEPIRIDE 4 MG PO TABS: 4.0000 mg | ORAL_TABLET | Freq: Every day | ORAL | 1 refills | Status: DC

## 2018-08-27 MED ORDER — LISINOPRIL 10 MG PO TABS: 10.0000 mg | ORAL_TABLET | Freq: Every day | ORAL | 1 refills | Status: DC

## 2018-08-28 NOTE — Telephone Encounter (Signed)
Patient notified that medication has been sent

## 2018-09-28 ENCOUNTER — Ambulatory Visit: Payer: BLUE CROSS/BLUE SHIELD | Admitting: Family Medicine

## 2018-09-30 ENCOUNTER — Other Ambulatory Visit: Payer: Self-pay | Admitting: Cardiovascular Disease

## 2018-10-01 ENCOUNTER — Encounter (HOSPITAL_COMMUNITY): Payer: Self-pay | Admitting: Emergency Medicine

## 2018-10-01 ENCOUNTER — Emergency Department (HOSPITAL_COMMUNITY)
Admission: EM | Admit: 2018-10-01 | Discharge: 2018-10-01 | Disposition: A | Discharge status: Home / Self Care | Payer: BLUE CROSS/BLUE SHIELD | Attending: Emergency Medicine | Admitting: Emergency Medicine

## 2018-10-01 ENCOUNTER — Other Ambulatory Visit: Payer: Self-pay

## 2018-10-01 ENCOUNTER — Emergency Department (HOSPITAL_COMMUNITY): Payer: BLUE CROSS/BLUE SHIELD

## 2018-10-01 DIAGNOSIS — I252 Old myocardial infarction: Secondary | ICD-10-CM | POA: Insufficient documentation

## 2018-10-01 DIAGNOSIS — Z7901 Long term (current) use of anticoagulants: Secondary | ICD-10-CM | POA: Diagnosis not present

## 2018-10-01 DIAGNOSIS — R0789 Other chest pain: Secondary | ICD-10-CM | POA: Diagnosis present

## 2018-10-01 DIAGNOSIS — Z7984 Long term (current) use of oral hypoglycemic drugs: Secondary | ICD-10-CM | POA: Insufficient documentation

## 2018-10-01 DIAGNOSIS — Z79899 Other long term (current) drug therapy: Secondary | ICD-10-CM | POA: Diagnosis not present

## 2018-10-01 DIAGNOSIS — Z87891 Personal history of nicotine dependence: Secondary | ICD-10-CM | POA: Diagnosis not present

## 2018-10-01 DIAGNOSIS — I10 Essential (primary) hypertension: Secondary | ICD-10-CM | POA: Diagnosis not present

## 2018-10-01 DIAGNOSIS — E119 Type 2 diabetes mellitus without complications: Secondary | ICD-10-CM | POA: Diagnosis not present

## 2018-10-01 DIAGNOSIS — I739 Peripheral vascular disease, unspecified: Secondary | ICD-10-CM | POA: Diagnosis not present

## 2018-10-01 DIAGNOSIS — M546 Pain in thoracic spine: Secondary | ICD-10-CM | POA: Diagnosis not present

## 2018-10-01 DIAGNOSIS — Z7982 Long term (current) use of aspirin: Secondary | ICD-10-CM | POA: Insufficient documentation

## 2018-10-01 LAB — CBC
HCT: 38.7 % — ABNORMAL LOW (ref 39.0–52.0)
Hemoglobin: 12.4 g/dL — ABNORMAL LOW (ref 13.0–17.0)
MCH: 28.3 pg (ref 26.0–34.0)
MCHC: 32 g/dL (ref 30.0–36.0)
MCV: 88.4 fL (ref 80.0–100.0)
Platelets: 265 K/uL (ref 150–400)
RBC: 4.38 MIL/uL (ref 4.22–5.81)
RDW: 13 % (ref 11.5–15.5)
WBC: 6.4 K/uL (ref 4.0–10.5)
nRBC: 0 % (ref 0.0–0.2)

## 2018-10-01 LAB — TROPONIN I
Troponin I: <0.03 ng/mL
Troponin I: <0.03 ng/mL (ref <0.03)

## 2018-10-01 LAB — BASIC METABOLIC PANEL WITH GFR
Anion gap: 12 (ref 5–15)
BUN: 8 mg/dL (ref 8–23)
CO2: 25 mmol/L (ref 22–32)
Calcium: 9.8 mg/dL (ref 8.9–10.3)
Chloride: 104 mmol/L (ref 98–111)
Creatinine, Ser: 1.02 mg/dL (ref 0.61–1.24)
GFR calc Af Amer: >60 mL/min
GFR calc non Af Amer: >60 mL/min
Glucose, Bld: 122 mg/dL — ABNORMAL HIGH (ref 70–99)
Potassium: 3.9 mmol/L (ref 3.5–5.1)
Sodium: 141 mmol/L (ref 135–145)

## 2018-10-01 MED ORDER — SODIUM CHLORIDE 0.9% FLUSH: 3.0000 mL | Freq: Once | INTRAVENOUS | Status: DC

## 2018-10-01 MED ORDER — ASPIRIN 81 MG PO CHEW
324.0000 mg | CHEWABLE_TABLET | Freq: Once | ORAL | Status: AC
  Administered 2018-10-01: 22:00:00 324 mg via ORAL
  Filled 2018-10-01: qty 4

## 2018-10-01 NOTE — ED Provider Notes (Signed)
[facility] [place] EMERGENCY DEPARTMENT Provider Note   CSN: [id number] Arrival date & time: 10/01/18  1734    History   Chief Complaint Chief Complaint  Patient presents with  . Chest Pain    HPI Kirk [name] is a 63 y.o. male with past medical history of an STEMI with DES, hypertension, hyperlipidemia, DM 2, peripheral arterial disease status post bilateral iliac stents, who presents today for evaluation of chest pain.  He reports that over the past 4 days he has had intermittent left-sided chest pain radiating to his back and down his arm whenever he walks or moves.  This will then let up when he rests.  He reports that this morning at about 2 AM he woke up with constant pain in that location.  It has slightly waxed and waned however is constant throughout the day.  He denies any shortness of breath.  He reports feeling slightly nauseous without any vomiting.  No abdominal pain or diarrhea.  He reports that he missed his dose of Plavix and his statin today, however he is otherwise compliant with his meds.     HPI  Past Medical History:  Diagnosis Date  . Arthritis    "right knee" (09/04/2015)  . HTN (hypertension)   . Hyperlipidemia   . NSTEMI (non-ST elevated myocardial infarction) ([place]) 09/04/2015   a. cath 09/05/2015: 95% stenosis mid-LAD, 55% mid-RCA, and 40% prox-RCA. PCI performed w/ Synergy DES to LAD  . PAD (peripheral artery disease) (HCC)    Stenting of bilateral iliacs in 2003.  [name] Refusal of blood transfusions as patient is Jehovah's Witness   . Type II diabetes mellitus [facility])     Patient Active Problem List   Diagnosis Date Noted  . Chest congestion 07/10/2018  . Cough 07/10/2018  . NSTEMI (non-ST elevated myocardial infarction) ([place]) 09/04/2015  . PVD (peripheral vascular disease) ([place]) 06/08/2015  . DDD (degenerative disc disease), cervical 06/08/2015  . Hyperlipidemia 06/08/2015  . Diabetes mellitus with nephropathy ([place]) 06/08/2015  .  Diabetes mellitus with peripheral vascular disease ([place]) 06/08/2015  . Annual physical exam 01/03/2015  . PAD (peripheral artery disease) ([place]) 02/18/2013  . Diabetes ([place]) 02/18/2013  . HTN (hypertension) 02/18/2013    Past Surgical History:  Procedure Laterality Date  . CARDIAC CATHETERIZATION N/A 09/05/2015   Procedure: Left Heart Cath and Coronary Angiography;  Surgeon: Troy Sine, MD;  Location: [place] [facility];  Service: Cardiovascular;  Laterality: N/A;  . CARDIAC CATHETERIZATION N/A 09/05/2015   Procedure: Coronary Stent Intervention;  Surgeon: Troy Sine, MD;  Location: [place] [facility];  Service: Cardiovascular;  Laterality: N/A;  . 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 PERIPHERAL VASCULAR CATHETERIZATION Bilateral 2003   "stenting"  . TONSILLECTOMY  ~ 1963        Home Medications    Prior to Admission medications   Medication Sig Start Date End Date Taking? Authorizing Provider  aspirin EC 81 MG tablet Take 81 mg by mouth at bedtime.   Yes [provider]  carvedilol (COREG) 6.25 MG tablet TAKE 1 TABLET BY MOUTH TWICE A DAY AFTER MEALS Patient taking differently: Take 6.25 mg by mouth 2 (two) times daily with a meal.  03/03/18  Yes Weaver, Scott T, PA-C  cetirizine (ZYRTEC) 10 MG tablet Take 1 tablet (10 mg total) by mouth daily. Patient taking differently: Take 10 mg by mouth daily as needed for allergies or rhinitis.  07/08/18  Yes [name], FNP  clopidogrel (PLAVIX) 75 MG tablet TAKE 1 TABLET BY MOUTH EVERY DAY Patient taking differently: Take 75 mg by mouth at bedtime.  08/17/18  Yes Burnell Blanks, MD  glimepiride (AMARYL) 4 MG tablet Take 1 tablet (4 mg total) by mouth daily. Patient taking differently: Take 4 mg by mouth at bedtime.  08/27/18  Yes [name], FNP  ipratropium (ATROVENT) 0.03 % nasal spray Place 2 sprays into both nostrils 2 (two) times daily. Patient taking differently: Place 2  sprays into both nostrils 2 (two) times daily as needed for rhinitis.  12/03/17  Yes [name], FNP  lisinopril (PRINIVIL,ZESTRIL) 10 MG tablet Take 1 tablet (10 mg total) by mouth daily. Patient taking differently: Take 10 mg by mouth at bedtime.  08/27/18  Yes [name], FNP  metFORMIN (GLUCOPHAGE) 1000 MG tablet TAKE 1 TABLET BY MOUTH TWICE A DAY WITH MEALS Patient taking differently: Take 1,000 mg by mouth 2 (two) times daily with a meal.  06/04/18  Yes [name], FNP  nitroGLYCERIN (NITROSTAT) 0.4 MG SL tablet Place 1 tablet (0.4 mg total) under the tongue every 5 (five) minutes x 3 doses as needed for chest pain. 08/26/17  Yes Imogene Burn, PA-C  rosuvastatin (CRESTOR) 40 MG tablet TAKE 1 TABLET(40 MG) BY MOUTH DAILY Patient taking differently: Take 40 mg by mouth at bedtime.  09/30/18  Yes Burnell Blanks, MD  guaiFENesin (MUCINEX) 600 MG 12 hr tablet Take 1 tablet (600 mg total) by mouth 2 (two) times daily. Patient not taking: Reported on 10/01/2018 07/08/18   [name], FNP  lactulose (CHRONULAC) 10 GM/15ML solution Take 30 mLs (20 g total) by mouth 2 (two) times daily as needed for moderate constipation. Patient not taking: Reported on 10/01/2018 07/28/17   Scot Jun, FNP    Family History Family History  Problem Relation Age of Onset  . Lymphoma Mother   . Cancer - Prostate Father   . CAD Neg Hx     Social History Social History   Tobacco Use  . Smoking status: Former Smoker    Packs/day: 2.00    Years: 35.00    Pack years: 70.00    Types: Cigarettes    Last attempt to quit: 02/19/2003    Years since quitting: 15.6  . Smokeless tobacco: Never Used  Substance Use Topics  . Alcohol use: Yes    Alcohol/week: 0.0 standard drinks    Comment: 09/04/2015 "nothing since 1980s"  . 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   Review of Systems Review of  Systems  Constitutional: Negative for chills, diaphoresis, fatigue and fever.  Respiratory: Negative for cough, chest tightness and shortness of breath.   Cardiovascular: Positive for chest pain. Negative for palpitations and leg swelling.  Gastrointestinal: Positive for nausea. Negative for abdominal pain and vomiting.  Musculoskeletal: Positive for back pain. Negative for neck pain.  All other systems reviewed and are negative.    Physical Exam Updated Vital Signs BP (!) 145/67   Pulse 65   Temp 97.8 F (36.6 C) (Oral)   Resp 14   SpO2 97%   Physical Exam Vitals signs and nursing note reviewed.  Constitutional:      General: He is not in acute distress.    Appearance: He is well-developed.  HENT:     Head: Normocephalic and atraumatic.  Eyes:     Conjunctiva/sclera: Conjunctivae normal.  Neck:     Musculoskeletal: Normal range of motion and neck supple.  Cardiovascular:     Rate and Rhythm: Normal rate and regular rhythm.     Pulses:          Dorsalis pedis pulses are 1+ on the right side and 1+ on the left side.     Heart sounds: Normal heart sounds. No murmur.  Pulmonary:     Effort: Pulmonary effort is normal. No accessory muscle usage or respiratory distress.     Breath sounds: Normal breath sounds. No decreased breath sounds.  Chest:     Chest wall: No tenderness.  Abdominal:     Palpations: Abdomen is soft.     Tenderness: There is no abdominal tenderness.  Musculoskeletal:     Right lower leg: He exhibits no tenderness. No edema.     Left lower leg: He exhibits no tenderness. No edema.  Skin:    General: Skin is warm and dry.  Neurological:     General: No focal deficit present.     Mental Status: He is alert and oriented to person, place, and time.  Psychiatric:        Mood and Affect: Mood normal.        Behavior: Behavior normal.      ED Treatments / Results  Labs (all labs ordered are listed, but only abnormal results are displayed) Labs  Reviewed  BASIC METABOLIC PANEL - Abnormal; Notable for the following components:      Result Value   Glucose, Bld 122 (*)    All other components within normal limits  CBC - Abnormal; Notable for the following components:   Hemoglobin 12.4 (*)    HCT 38.7 (*)    All other components within normal limits  TROPONIN I  TROPONIN I    EKG EKG Interpretation  Date/Time:  Thursday Oct 01 2018 18:42:33 EDT Ventricular Rate:  67 PR Interval:    QRS Duration: 86 QT Interval:  395 QTC Calculation: 417 R Axis:   81 Text Interpretation:  sinus rhythm  Borderline right axis deviation No significant change since last tracing Confirmed by Wandra Arthurs [phone]) on 10/01/2018 6:44:48 PM   Radiology Dg Chest 2 View  Result Date: 10/01/2018 CLINICAL DATA:  Chest pain. EXAM: CHEST - 2 VIEW COMPARISON:  Radiographs of July 02, 2018. FINDINGS: The heart size and mediastinal contours are within normal limits. Both lungs are clear. No pneumothorax or pleural effusion is noted. The visualized skeletal structures are unremarkable. IMPRESSION: No active cardiopulmonary disease. Electronically Signed   By: Marijo Conception M.D.   On: 10/01/2018 18:07    Procedures Procedures (including critical care time)  Medications Ordered in ED Medications  sodium chloride flush (NS) 0.9 % injection 3 mL (3 mLs Intravenous Not Given 10/01/18 2119)  aspirin chewable tablet 324 mg (324 mg Oral Given 10/01/18 2133)     Initial Impression / Assessment and Plan / ED Course  I have reviewed the triage vital signs and the nursing notes.  Pertinent labs & imaging results that were available during my care of the patient were reviewed by me and considered in my medical decision making (see chart for details).  Clinical Course as of Oct 01 2223  Thu Oct 01, 2018  2031 Cardiology to come see patient.    [EH]  2146 Cardiology saw,plan to discharge  per cardiology after second trop.    [EH]    Clinical Course User Index  [EH] Lorin Glass, PA-C      Patient presents today for evaluation of left-sided chest and back pain radiating into his left arm for approximately 4 days.  Initially this pain was only present with exertion and was relieved with rest, however since about 2 AM his pain is been constant in nature.  Treated with aspirin. Chest x-ray without evidence of acute abnormalities.  EKG obtained without evidence of ischemia.  Labs are obtained and reviewed without significant hematologic or electrolyte derangements.  Troponin negative x2.  This patient was discussed with cardiology who came to evaluate the patient and recommended discharge home.  Do not suspect ACS with ekg non ischemic with negative troponinx2.  Do not suspect dissection as he is neurovascularly intact without evidence of widened mediastinum on chest x-ray.    Return precautions were discussed with patient who states their understanding.  At the time of discharge patient denied any unaddressed complaints or concerns.  Patient is agreeable for discharge home.     Final Clinical Impressions(s) / ED Diagnoses   Final diagnoses:  Atypical chest pain    ED Discharge Orders    None       Ollen Gross 10/01/18 2228    Drenda Freeze, MD 10/01/18 [phone]

## 2018-10-01 NOTE — ED Triage Notes (Signed)
Pt c/o intermittent left sided chest pain that radiates to left arm x 3 days. Pain has not subsided today, hx MI with stent. Denies shortness of breath/diaphoresis/nausea. No fever/cough or sick contacts.

## 2018-10-01 NOTE — ED Notes (Signed)
Cardiology at bedside.

## 2018-10-01 NOTE — Discharge Instructions (Addendum)
Today you were seen by cardiology who recommended discharge home with outpatient follow-up.  Please call your cardiologist to schedule an appointment.  If your symptoms worsen or become concerning please seek additional medical care and evaluation.

## 2018-10-01 NOTE — Telephone Encounter (Signed)
Message sent to provider 

## 2018-10-10 ENCOUNTER — Other Ambulatory Visit: Payer: Self-pay | Admitting: Physician Assistant

## 2018-10-10 DIAGNOSIS — I2510 Atherosclerotic heart disease of native coronary artery without angina pectoris: Secondary | ICD-10-CM

## 2018-10-10 DIAGNOSIS — I10 Essential (primary) hypertension: Secondary | ICD-10-CM

## 2018-10-10 DIAGNOSIS — I517 Cardiomegaly: Secondary | ICD-10-CM

## 2018-11-11 ENCOUNTER — Ambulatory Visit (INDEPENDENT_AMBULATORY_CARE_PROVIDER_SITE_OTHER): Payer: BLUE CROSS/BLUE SHIELD | Admitting: Family Medicine

## 2018-11-11 ENCOUNTER — Encounter: Payer: Self-pay | Admitting: Family Medicine

## 2018-11-11 ENCOUNTER — Other Ambulatory Visit: Payer: Self-pay

## 2018-11-11 VITALS — BP 132/60 | HR 84 | Temp 97.9°F | Ht 75.0 in | Wt 226.0 lb

## 2018-11-11 DIAGNOSIS — E119 Type 2 diabetes mellitus without complications: Secondary | ICD-10-CM | POA: Diagnosis not present

## 2018-11-11 DIAGNOSIS — Z09 Encounter for follow-up examination after completed treatment for conditions other than malignant neoplasm: Secondary | ICD-10-CM | POA: Diagnosis not present

## 2018-11-11 DIAGNOSIS — I10 Essential (primary) hypertension: Secondary | ICD-10-CM

## 2018-11-11 MED ORDER — ONE-A-DAY MENS PO TABS: 1.0000 | ORAL_TABLET | Freq: Every day | ORAL | 6 refills | Status: DC

## 2018-11-11 NOTE — Progress Notes (Signed)
Patient [facility] Follow Up  Subjective:  Patient ID: Kirk [name], male    DOB: [birth year]  Age: 63 y.o. MRN: [id number]  CC:  Chief Complaint  Patient presents with  . Nasal Congestion  . Sore Throat  . Sinusitis  . chest congestion    HPI Kirk [name] is a 63 year old male who presents for follow up today.   Past Medical History:  Diagnosis Date  . Arthritis    "right knee" (09/04/2015)  . HTN (hypertension)   . Hyperlipidemia   . NSTEMI (non-ST elevated myocardial infarction) ([place]) 09/04/2015   a. cath 09/05/2015: 95% stenosis mid-LAD, 55% mid-RCA, and 40% prox-RCA. PCI performed w/ Synergy DES to LAD  . PAD (peripheral artery disease) (HCC)    Stenting of bilateral iliacs in 2003.  [name] Refusal of blood transfusions as patient is Jehovah's Witness   . Type II diabetes mellitus ([place])    Current Status: Since his last office visit, he has had an ED visit on 10/01/2018 for Atypical Chest Pain. Today, he is doing well with no complaints. He denies visual changes, chest pain, cough, shortness of breath, heart palpitations, and falls. He has occasional headaches and dizziness with position changes. Denies severe headaches, confusion, seizures, double vision, and blurred vision, nausea and vomiting. He denies fatigue, frequent urination, blurred vision, excessive hunger, excessive thirst, weight gain, weight loss, and poor wound healing. He continues to check his feet regularly. He denies fevers, chills, recent infections, weight loss, and night sweats.  No reports of GI problems such as diarrhea, and constipation. He has no reports of blood in stools, dysuria and hematuria. No depression or anxiety reported. He denies pain today.   Past Surgical History:  Procedure Laterality Date  . CARDIAC CATHETERIZATION N/A 09/05/2015   Procedure: Left Heart Cath and Coronary Angiography;  Surgeon: Troy Sine, MD;  Location: [facility];  Service: Cardiovascular;  Laterality: N/A;  . CARDIAC CATHETERIZATION N/A 09/05/2015   Procedure: Coronary Stent Intervention;  Surgeon: Troy Sine, MD;  Location: [facility];  Service: Cardiovascular;  Laterality: N/A;  . 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 PERIPHERAL VASCULAR CATHETERIZATION Bilateral 2003   "stenting"  . TONSILLECTOMY  ~ 1963    Family History  Problem Relation Age of Onset  . Lymphoma Mother   . Cancer - Prostate Father   . CAD Neg Hx     Social History   Socioeconomic History  . Marital status: Divorced    Spouse name: Not on file  . Number of children: 1  . Years of education: Not on file  . Highest education level: Not on file  Occupational History  . Occupation: Acupuncturist  Social Needs  . Financial resource strain: Not on file  . Food insecurity    Worry: Not on file    Inability: Not on file  . Transportation needs    Medical: Not on file    Non-medical: Not on file  Tobacco Use  . Smoking status: Former Smoker    Packs/day: 2.00    Years: 35.00    Pack years: 70.00    Types: Cigarettes    Quit date: 02/19/2003    Years since quitting: 15.7  . Smokeless tobacco: Never Used  Substance and Sexual Activity  . Alcohol use: Yes    Alcohol/week: 0.0 standard  drinks    Comment: 09/04/2015 "nothing since 1980s"  . 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 Sexual activity: Not on file  Lifestyle  . Physical activity    Days per week: Not on file    Minutes per session: Not on file  . Stress: Not on file  Relationships  . Social Herbalist on phone: Not on file    Gets together: Not on file    Attends religious service: Not on file    Active member of club or organization: Not on file    Attends meetings of clubs or organizations: Not on file    Relationship status: Not on file  . Intimate partner violence    Fear of current or  ex partner: Not on file    Emotionally abused: Not on file    Physically abused: Not on file    Forced sexual activity: Not on file  Other Topics Concern  . Not on file  Social History Narrative  . Not on file    Outpatient Medications Prior to Visit  Medication Sig Dispense Refill  . aspirin EC 81 MG tablet Take 81 mg by mouth at bedtime.    . carvedilol (COREG) 6.25 MG tablet TAKE 1 TABLET BY MOUTH TWICE DAILY AFTER MEALS 180 tablet 2  . cetirizine (ZYRTEC) 10 MG tablet Take 1 tablet (10 mg total) by mouth daily. (Patient taking differently: Take 10 mg by mouth daily as needed for allergies or rhinitis. ) 30 tablet 11  . clopidogrel (PLAVIX) 75 MG tablet TAKE 1 TABLET BY MOUTH EVERY DAY (Patient taking differently: Take 75 mg by mouth at bedtime. ) 30 tablet 10  . glimepiride (AMARYL) 4 MG tablet Take 1 tablet (4 mg total) by mouth daily. (Patient taking differently: Take 4 mg by mouth at bedtime. ) 90 tablet 1  . ipratropium (ATROVENT) 0.03 % nasal spray Place 2 sprays into both nostrils 2 (two) times daily. (Patient taking differently: Place 2 sprays into both nostrils 2 (two) times daily as needed for rhinitis. ) 30 mL 2  . lisinopril (PRINIVIL,ZESTRIL) 10 MG tablet Take 1 tablet (10 mg total) by mouth daily. (Patient taking differently: Take 10 mg by mouth at bedtime. ) 90 tablet 1  . metFORMIN (GLUCOPHAGE) 1000 MG tablet TAKE 1 TABLET BY MOUTH TWICE A DAY WITH MEALS (Patient taking differently: Take 1,000 mg by mouth 2 (two) times daily with a meal. ) 180 tablet 2  . nitroGLYCERIN (NITROSTAT) 0.4 MG SL tablet Place 1 tablet (0.4 mg total) under the tongue every 5 (five) minutes x 3 doses as needed for chest pain. 25 tablet 2  . rosuvastatin (CRESTOR) 40 MG tablet TAKE 1 TABLET(40 MG) BY MOUTH DAILY (Patient taking differently: Take 40 mg by mouth at bedtime. ) 90 tablet 2  . guaiFENesin (MUCINEX) 600 MG 12 hr tablet Take 1 tablet (600 mg total) by mouth 2 (two) times daily. (Patient not  taking: Reported on 10/01/2018) 30 tablet 0  . lactulose (CHRONULAC) 10 GM/15ML solution Take 30 mLs (20 g total) by mouth 2 (two) times daily as needed for moderate constipation. (Patient not taking: Reported on 10/01/2018) 240 mL 0   No facility-administered medications prior to visit.     No Known Allergies  ROS Review of Systems  Constitutional: Negative.   HENT: Negative.   Eyes: Negative.   Respiratory: Negative.   Cardiovascular: Negative.  Gastrointestinal: Negative.   Endocrine: Negative.   Genitourinary: Negative.   Musculoskeletal: Negative.   Skin: Negative.   Allergic/Immunologic: Negative.   Neurological: Positive for dizziness (occasional) and headaches (Occasional).  Hematological: Negative.   Psychiatric/Behavioral: Negative.       Objective:    Physical Exam  Constitutional: He is oriented to person, place, and time. He appears well-developed and well-nourished.  HENT:  Head: Normocephalic and atraumatic.  Eyes: Conjunctivae are normal.  Neck: Normal range of motion. Neck supple.  Cardiovascular: Normal rate, regular rhythm, normal heart sounds and intact distal pulses.  Pulmonary/Chest: Effort normal and breath sounds normal.  Abdominal: Soft. Bowel sounds are normal.  Musculoskeletal: Normal range of motion.  Neurological: He is alert and oriented to person, place, and time. He has normal reflexes.  Skin: Skin is warm and dry.  Psychiatric: He has a normal mood and affect. His behavior is normal. Judgment and thought content normal.  Nursing note and vitals reviewed.   BP 132/60 (BP Location: Left Arm, Patient Position: Sitting, Cuff Size: Large)   Pulse 84   Temp 97.9 F (36.6 C) (Oral)   Ht 6\' 3"  (1.905 m)   Wt 226 lb (102.5 kg)   SpO2 97%   BMI 28.25 kg/m  Wt Readings from Last 3 Encounters:  11/11/18 226 lb (102.5 kg)  07/22/18 233 lb 6.4 oz (105.9 kg)  07/08/18 229 lb 12.8 oz (104.2 kg)     Health Maintenance Due  Topic Date Due  .  COLONOSCOPY  01/30/2004  . FOOT EXAM  07/02/2017  . OPHTHALMOLOGY EXAM  07/11/2018    There are no preventive care reminders to display for this patient.  Lab Results  Component Value Date   TSH 1.083 09/04/2015   Lab Results  Component Value Date   WBC 6.4 10/01/2018   HGB 12.4 (L) 10/01/2018   HCT 38.7 (L) 10/01/2018   MCV 88.4 10/01/2018   PLT 265 10/01/2018   Lab Results  Component Value Date   NA 141 10/01/2018   K 3.9 10/01/2018   CO2 25 10/01/2018   GLUCOSE 122 (H) 10/01/2018   BUN 8 10/01/2018   CREATININE 1.02 10/01/2018   BILITOT 0.3 07/22/2018   ALKPHOS 57 07/22/2018   AST 18 07/22/2018   ALT 21 07/22/2018   PROT 7.5 07/22/2018   ALBUMIN 4.6 07/22/2018   CALCIUM 9.8 10/01/2018   ANIONGAP 12 10/01/2018   Lab Results  Component Value Date   CHOL 114 07/22/2018   Lab Results  Component Value Date   HDL 32 (L) 07/22/2018   Lab Results  Component Value Date   LDLCALC 60 07/22/2018   Lab Results  Component Value Date   TRIG 111 07/22/2018   Lab Results  Component Value Date   CHOLHDL 3.6 07/22/2018   Lab Results  Component Value Date   HGBA1C 7.2 (A) 07/08/2018      Assessment & Plan:   1. Essential hypertension The current medical regimen is effective; blood pressure is stable at 132/60 today; continue present plan and medications as prescribed. He will continue to decrease high sodium intake, excessive alcohol intake, increase potassium intake, smoking cessation, and increase physical activity of at least 30 minutes of cardio activity daily. He will continue to follow Heart Healthy or DASH diet.  2. Type 2 diabetes mellitus without complication, without long-term current use of insulin (HCC) Hgb A1c is stable at 7.2 today. He will continue to decrease foods/beverages high in sugars and carbs and  follow Heart Healthy or DASH diet. Increase physical activity to at least 30 minutes cardio exercise daily.   3. Follow up He will follow up in 6  moths.   Meds ordered this encounter  Medications  . multivitamin (ONE-A-DAY MEN'S) TABS tablet    Sig: Take 1 tablet by mouth daily.    Dispense:  30 tablet    Refill:  6   No orders of the defined types were placed in this encounter.   Referral Orders  No referral(s) requested today    Kathe Becton,  MSN, FNP-BC Patient [facility] [place], [place] [phone]     Problem List Items Addressed This Visit      Cardiovascular and Mediastinum   HTN (hypertension) - Primary     Endocrine   Diabetes ([place])    Other Visit Diagnoses    Follow up          Meds ordered this encounter  Medications  . multivitamin (ONE-A-DAY MEN'S) TABS tablet    Sig: Take 1 tablet by mouth daily.    Dispense:  30 tablet    Refill:  6    Follow-up: Return in about 6 months (around 05/13/2019).    [name], FNP

## 2018-11-13 ENCOUNTER — Other Ambulatory Visit: Payer: Self-pay

## 2018-11-13 DIAGNOSIS — Z20822 Contact with and (suspected) exposure to covid-19: Secondary | ICD-10-CM

## 2018-11-13 NOTE — Addendum Note (Signed)
Addended by: Brigitte Pulse on: 11/13/2018 10:49 AM   Modules accepted: Orders

## 2018-11-13 NOTE — Progress Notes (Signed)
la 

## 2018-11-15 LAB — NOVEL CORONAVIRUS, NAA: SARS-CoV-2, NAA: NOT DETECTED

## 2018-12-15 ENCOUNTER — Other Ambulatory Visit: Payer: Self-pay

## 2018-12-15 DIAGNOSIS — Z20822 Contact with and (suspected) exposure to covid-19: Secondary | ICD-10-CM

## 2018-12-19 LAB — NOVEL CORONAVIRUS, NAA: SARS-CoV-2, NAA: NOT DETECTED

## 2019-01-06 ENCOUNTER — Ambulatory Visit: Payer: BLUE CROSS/BLUE SHIELD | Admitting: Family Medicine

## 2019-01-12 ENCOUNTER — Other Ambulatory Visit: Payer: Self-pay | Admitting: Family Medicine

## 2019-01-12 DIAGNOSIS — J329 Chronic sinusitis, unspecified: Secondary | ICD-10-CM

## 2019-01-13 ENCOUNTER — Other Ambulatory Visit: Payer: Self-pay

## 2019-01-13 DIAGNOSIS — Z20822 Contact with and (suspected) exposure to covid-19: Secondary | ICD-10-CM

## 2019-01-14 LAB — NOVEL CORONAVIRUS, NAA: SARS-CoV-2, NAA: NOT DETECTED

## 2019-02-03 ENCOUNTER — Other Ambulatory Visit: Payer: Self-pay | Admitting: Family Medicine

## 2019-02-03 DIAGNOSIS — I10 Essential (primary) hypertension: Secondary | ICD-10-CM

## 2019-02-18 ENCOUNTER — Other Ambulatory Visit: Payer: Self-pay

## 2019-02-18 DIAGNOSIS — I10 Essential (primary) hypertension: Secondary | ICD-10-CM

## 2019-02-18 MED ORDER — GLIMEPIRIDE 4 MG PO TABS: 4.0000 mg | ORAL_TABLET | Freq: Every day | ORAL | 1 refills | Status: DC

## 2019-02-18 MED ORDER — LISINOPRIL 10 MG PO TABS: 10.0000 mg | ORAL_TABLET | Freq: Every day | ORAL | 1 refills | Status: DC

## 2019-02-19 ENCOUNTER — Other Ambulatory Visit: Payer: Self-pay

## 2019-02-19 DIAGNOSIS — I10 Essential (primary) hypertension: Secondary | ICD-10-CM

## 2019-02-19 MED ORDER — GLIMEPIRIDE 4 MG PO TABS: 4.0000 mg | ORAL_TABLET | Freq: Every day | ORAL | 1 refills | Status: DC

## 2019-02-19 MED ORDER — LISINOPRIL 10 MG PO TABS: 10.0000 mg | ORAL_TABLET | Freq: Every day | ORAL | 1 refills | Status: DC

## 2019-03-02 ENCOUNTER — Other Ambulatory Visit: Payer: Self-pay

## 2019-03-02 DIAGNOSIS — Z20822 Contact with and (suspected) exposure to covid-19: Secondary | ICD-10-CM

## 2019-03-05 ENCOUNTER — Other Ambulatory Visit: Payer: Self-pay | Admitting: Family Medicine

## 2019-03-05 LAB — NOVEL CORONAVIRUS, NAA: SARS-CoV-2, NAA: NOT DETECTED

## 2019-03-23 ENCOUNTER — Other Ambulatory Visit: Payer: Self-pay | Admitting: *Deleted

## 2019-03-23 DIAGNOSIS — Z20822 Contact with and (suspected) exposure to covid-19: Secondary | ICD-10-CM

## 2019-03-25 LAB — NOVEL CORONAVIRUS, NAA: SARS-CoV-2, NAA: NOT DETECTED

## 2019-03-29 ENCOUNTER — Telehealth: Payer: Self-pay | Admitting: Family Medicine

## 2019-03-29 ENCOUNTER — Other Ambulatory Visit: Payer: Self-pay | Admitting: Family Medicine

## 2019-03-29 DIAGNOSIS — E119 Type 2 diabetes mellitus without complications: Secondary | ICD-10-CM

## 2019-03-29 MED ORDER — METFORMIN HCL 1000 MG PO TABS: 1000.0000 mg | ORAL_TABLET | Freq: Two times a day (BID) | ORAL | 1 refills | Status: DC

## 2019-03-30 ENCOUNTER — Other Ambulatory Visit: Payer: Self-pay | Admitting: Family Medicine

## 2019-03-30 ENCOUNTER — Telehealth: Payer: Self-pay | Admitting: Family Medicine

## 2019-03-30 DIAGNOSIS — R05 Cough: Secondary | ICD-10-CM

## 2019-03-30 DIAGNOSIS — R059 Cough, unspecified: Secondary | ICD-10-CM

## 2019-03-30 MED ORDER — BENZONATATE 100 MG PO CAPS: 100.0000 mg | ORAL_CAPSULE | Freq: Two times a day (BID) | ORAL | 0 refills | Status: AC | PRN

## 2019-03-30 NOTE — Telephone Encounter (Signed)
Pt would like a call back he says he is getting a cold and would like something called in for his cold or he could come in I advised him since he has a cold he could not come into the office the nurse would give him a call   Routing comment     Kirk [name], Kirk [name] [phone]  Ophelia Shoulder, NT    Kirk [name] c/o: Chest congestion (white mucus), ear pain, body aches.  Taking Nyquil & Robitussin without relief.   Per patient he tested negative for Covid last week.   If granted please send Rx to [facility] on [address].

## 2019-03-31 ENCOUNTER — Other Ambulatory Visit: Payer: Self-pay | Admitting: *Deleted

## 2019-03-31 DIAGNOSIS — Z20822 Contact with and (suspected) exposure to covid-19: Secondary | ICD-10-CM

## 2019-03-31 NOTE — Telephone Encounter (Signed)
Sent to NP 

## 2019-04-01 LAB — NOVEL CORONAVIRUS, NAA: SARS-CoV-2, NAA: DETECTED — AB

## 2019-04-08 ENCOUNTER — Other Ambulatory Visit: Payer: Self-pay | Admitting: Family Medicine

## 2019-04-08 ENCOUNTER — Telehealth: Payer: Self-pay

## 2019-04-08 DIAGNOSIS — R059 Cough, unspecified: Secondary | ICD-10-CM

## 2019-04-08 DIAGNOSIS — U071 COVID-19: Secondary | ICD-10-CM

## 2019-04-08 DIAGNOSIS — R0989 Other specified symptoms and signs involving the circulatory and respiratory systems: Secondary | ICD-10-CM

## 2019-04-08 DIAGNOSIS — R05 Cough: Secondary | ICD-10-CM

## 2019-04-08 MED ORDER — HYDROCODONE-HOMATROPINE 5-1.5 MG/5ML PO SYRP: 5.0000 mL | ORAL_SOLUTION | Freq: Three times a day (TID) | ORAL | 0 refills | Status: DC | PRN

## 2019-04-08 NOTE — Telephone Encounter (Signed)
Patient called. He tested positive for covid x 1 week ago. He states he feels better but is still having chest congestion and is asking for a cough syrup to help break this up. Please advise.

## 2019-04-08 NOTE — Telephone Encounter (Signed)
Called and canceled rx at [facility].

## 2019-04-08 NOTE — Telephone Encounter (Signed)
Patient informed patient. Thanks!

## 2019-04-08 NOTE — Telephone Encounter (Signed)
Lanelle Bal, this was sent to wrong pharmacy. Can you please sent to [facility] on [address].

## 2019-04-19 ENCOUNTER — Encounter (HOSPITAL_COMMUNITY): Payer: Self-pay | Admitting: Emergency Medicine

## 2019-04-19 ENCOUNTER — Other Ambulatory Visit: Payer: Self-pay

## 2019-04-19 ENCOUNTER — Observation Stay (HOSPITAL_COMMUNITY)
Admission: EM | Admit: 2019-04-19 | Discharge: 2019-04-20 | Disposition: A | Discharge status: Home / Self Care | Payer: Medicare Other | Attending: Cardiovascular Disease | Admitting: Cardiovascular Disease

## 2019-04-19 ENCOUNTER — Emergency Department (HOSPITAL_COMMUNITY): Payer: Medicare Other

## 2019-04-19 DIAGNOSIS — Z7982 Long term (current) use of aspirin: Secondary | ICD-10-CM | POA: Insufficient documentation

## 2019-04-19 DIAGNOSIS — Z8042 Family history of malignant neoplasm of prostate: Secondary | ICD-10-CM | POA: Insufficient documentation

## 2019-04-19 DIAGNOSIS — Z7902 Long term (current) use of antithrombotics/antiplatelets: Secondary | ICD-10-CM | POA: Insufficient documentation

## 2019-04-19 DIAGNOSIS — E1121 Type 2 diabetes mellitus with diabetic nephropathy: Secondary | ICD-10-CM | POA: Diagnosis not present

## 2019-04-19 DIAGNOSIS — Z8619 Personal history of other infectious and parasitic diseases: Secondary | ICD-10-CM | POA: Diagnosis not present

## 2019-04-19 DIAGNOSIS — I491 Atrial premature depolarization: Secondary | ICD-10-CM | POA: Insufficient documentation

## 2019-04-19 DIAGNOSIS — Z79899 Other long term (current) drug therapy: Secondary | ICD-10-CM | POA: Diagnosis not present

## 2019-04-19 DIAGNOSIS — I4891 Unspecified atrial fibrillation: Secondary | ICD-10-CM | POA: Insufficient documentation

## 2019-04-19 DIAGNOSIS — Z7984 Long term (current) use of oral hypoglycemic drugs: Secondary | ICD-10-CM | POA: Diagnosis not present

## 2019-04-19 DIAGNOSIS — R079 Chest pain, unspecified: Secondary | ICD-10-CM | POA: Diagnosis present

## 2019-04-19 DIAGNOSIS — E1151 Type 2 diabetes mellitus with diabetic peripheral angiopathy without gangrene: Secondary | ICD-10-CM | POA: Diagnosis not present

## 2019-04-19 DIAGNOSIS — I441 Atrioventricular block, second degree: Secondary | ICD-10-CM | POA: Diagnosis not present

## 2019-04-19 DIAGNOSIS — I4892 Unspecified atrial flutter: Secondary | ICD-10-CM

## 2019-04-19 DIAGNOSIS — E785 Hyperlipidemia, unspecified: Secondary | ICD-10-CM | POA: Diagnosis present

## 2019-04-19 DIAGNOSIS — I252 Old myocardial infarction: Secondary | ICD-10-CM | POA: Insufficient documentation

## 2019-04-19 DIAGNOSIS — I509 Heart failure, unspecified: Secondary | ICD-10-CM | POA: Insufficient documentation

## 2019-04-19 DIAGNOSIS — Z87891 Personal history of nicotine dependence: Secondary | ICD-10-CM | POA: Insufficient documentation

## 2019-04-19 DIAGNOSIS — Z955 Presence of coronary angioplasty implant and graft: Secondary | ICD-10-CM | POA: Diagnosis not present

## 2019-04-19 DIAGNOSIS — I110 Hypertensive heart disease with heart failure: Secondary | ICD-10-CM | POA: Insufficient documentation

## 2019-04-19 DIAGNOSIS — I2510 Atherosclerotic heart disease of native coronary artery without angina pectoris: Secondary | ICD-10-CM | POA: Insufficient documentation

## 2019-04-19 DIAGNOSIS — Z807 Family history of other malignant neoplasms of lymphoid, hematopoietic and related tissues: Secondary | ICD-10-CM | POA: Diagnosis not present

## 2019-04-19 DIAGNOSIS — R1310 Dysphagia, unspecified: Secondary | ICD-10-CM

## 2019-04-19 DIAGNOSIS — I10 Essential (primary) hypertension: Secondary | ICD-10-CM | POA: Diagnosis present

## 2019-04-19 HISTORY — DX: Unspecified atrial flutter: I48.92

## 2019-04-19 LAB — HEMOGLOBIN A1C
Hgb A1c MFr Bld: 8.2 % — ABNORMAL HIGH (ref 4.8–5.6)
Mean Plasma Glucose: 188.64 mg/dL

## 2019-04-19 LAB — HEPATIC FUNCTION PANEL
ALT: 21 U/L (ref 0–44)
AST: 18 U/L (ref 15–41)
Albumin: 3.5 g/dL (ref 3.5–5.0)
Alkaline Phosphatase: 58 U/L (ref 38–126)
Bilirubin, Direct: 0.1 mg/dL (ref 0.0–0.2)
Indirect Bilirubin: 0.3 mg/dL (ref 0.3–0.9)
Total Bilirubin: 0.4 mg/dL (ref 0.3–1.2)
Total Protein: 8.1 g/dL (ref 6.5–8.1)

## 2019-04-19 LAB — CBC
HCT: 39.5 % (ref 39.0–52.0)
Hemoglobin: 12.5 g/dL — ABNORMAL LOW (ref 13.0–17.0)
MCH: 28.3 pg (ref 26.0–34.0)
MCHC: 31.6 g/dL (ref 30.0–36.0)
MCV: 89.4 fL (ref 80.0–100.0)
Platelets: 434 10*3/uL — ABNORMAL HIGH (ref 150–400)
RBC: 4.42 MIL/uL (ref 4.22–5.81)
RDW: 12.7 % (ref 11.5–15.5)
WBC: 9.2 10*3/uL (ref 4.0–10.5)
nRBC: 0 % (ref 0.0–0.2)

## 2019-04-19 LAB — TROPONIN I (HIGH SENSITIVITY)
Troponin I (High Sensitivity): 5 ng/L (ref <18)
Troponin I (High Sensitivity): 6 ng/L (ref <18)
Troponin I (High Sensitivity): 5 ng/L (ref <18)
Troponin I (High Sensitivity): 6 ng/L (ref <18)

## 2019-04-19 LAB — PROTIME-INR
INR: 1.1 (ref 0.8–1.2)
Prothrombin Time: 14.1 seconds (ref 11.4–15.2)

## 2019-04-19 LAB — GLUCOSE, CAPILLARY
Glucose-Capillary: 160 mg/dL — ABNORMAL HIGH (ref 70–99)
Glucose-Capillary: 171 mg/dL — ABNORMAL HIGH (ref 70–99)

## 2019-04-19 LAB — BASIC METABOLIC PANEL
Anion gap: 10 (ref 5–15)
BUN: 11 mg/dL (ref 8–23)
CO2: 25 mmol/L (ref 22–32)
Calcium: 9.2 mg/dL (ref 8.9–10.3)
Chloride: 100 mmol/L (ref 98–111)
Creatinine, Ser: 1.08 mg/dL (ref 0.61–1.24)
GFR calc Af Amer: >60 mL/min (ref >60)
GFR calc non Af Amer: >60 mL/min (ref >60)
Glucose, Bld: 272 mg/dL — ABNORMAL HIGH (ref 70–99)
Potassium: 4.3 mmol/L (ref 3.5–5.1)
Sodium: 135 mmol/L (ref 135–145)

## 2019-04-19 LAB — HIV ANTIBODY (ROUTINE TESTING W REFLEX): HIV Screen 4th Generation wRfx: NONREACTIVE

## 2019-04-19 LAB — HEPARIN LEVEL (UNFRACTIONATED): Heparin Unfractionated: 0.57 IU/mL (ref 0.30–0.70)

## 2019-04-19 LAB — TSH: TSH: 0.367 u[IU]/mL (ref 0.350–4.500)

## 2019-04-19 LAB — LIPASE, BLOOD: Lipase: 39 U/L (ref 11–51)

## 2019-04-19 LAB — APTT: aPTT: 27 seconds (ref 24–36)

## 2019-04-19 MED ORDER — DILTIAZEM HCL ER COATED BEADS 120 MG PO CP24
120.0000 mg | ORAL_CAPSULE | Freq: Every day | ORAL | Status: DC
  Administered 2019-04-19 – 2019-04-20 (×2): 120 mg via ORAL
  Filled 2019-04-19 (×2): qty 1

## 2019-04-19 MED ORDER — HYDROCODONE-HOMATROPINE 5-1.5 MG/5ML PO SYRP: 5.0000 mL | ORAL_SOLUTION | Freq: Three times a day (TID) | ORAL | Status: DC | PRN

## 2019-04-19 MED ORDER — SODIUM CHLORIDE 0.9% FLUSH: 3.0000 mL | INTRAVENOUS | Status: DC | PRN

## 2019-04-19 MED ORDER — INSULIN ASPART 100 UNIT/ML ~~LOC~~ SOLN
0.0000 [IU] | Freq: Three times a day (TID) | SUBCUTANEOUS | Status: DC
  Administered 2019-04-19 – 2019-04-20 (×2): 3 [IU] via SUBCUTANEOUS

## 2019-04-19 MED ORDER — HEPARIN (PORCINE) 25000 UT/250ML-% IV SOLN
1550.0000 [IU]/h | INTRAVENOUS | Status: DC
  Administered 2019-04-19: 1350 [IU]/h via INTRAVENOUS
  Administered 2019-04-20: 1550 [IU]/h via INTRAVENOUS
  Filled 2019-04-19 (×2): qty 250

## 2019-04-19 MED ORDER — ONDANSETRON HCL 4 MG/2ML IJ SOLN: 4.0000 mg | Freq: Four times a day (QID) | INTRAMUSCULAR | Status: DC | PRN

## 2019-04-19 MED ORDER — SODIUM CHLORIDE 0.9 % IV SOLN: 250.0000 mL | INTRAVENOUS | Status: DC | PRN

## 2019-04-19 MED ORDER — ASPIRIN EC 81 MG PO TBEC
81.0000 mg | DELAYED_RELEASE_TABLET | Freq: Every day | ORAL | Status: DC
  Administered 2019-04-19: 81 mg via ORAL
  Filled 2019-04-19: qty 1

## 2019-04-19 MED ORDER — NITROGLYCERIN 0.4 MG SL SUBL: 0.4000 mg | SUBLINGUAL_TABLET | SUBLINGUAL | Status: DC | PRN

## 2019-04-19 MED ORDER — LORATADINE 10 MG PO TABS
10.0000 mg | ORAL_TABLET | Freq: Every day | ORAL | Status: DC
  Administered 2019-04-19 – 2019-04-20 (×2): 10 mg via ORAL
  Filled 2019-04-19 (×2): qty 1

## 2019-04-19 MED ORDER — ALPRAZOLAM 0.25 MG PO TABS: 0.2500 mg | ORAL_TABLET | Freq: Two times a day (BID) | ORAL | Status: DC | PRN

## 2019-04-19 MED ORDER — INSULIN ASPART 100 UNIT/ML ~~LOC~~ SOLN: 0.0000 [IU] | Freq: Every day | SUBCUTANEOUS | Status: DC

## 2019-04-19 MED ORDER — HEPARIN BOLUS VIA INFUSION
4000.0000 [IU] | Freq: Once | INTRAVENOUS | Status: AC
  Administered 2019-04-19: 4000 [IU] via INTRAVENOUS
  Filled 2019-04-19: qty 4000

## 2019-04-19 MED ORDER — CARVEDILOL 12.5 MG PO TABS
6.2500 mg | ORAL_TABLET | Freq: Two times a day (BID) | ORAL | Status: DC
  Administered 2019-04-19 – 2019-04-20 (×2): 6.25 mg via ORAL
  Filled 2019-04-19 (×2): qty 1

## 2019-04-19 MED ORDER — PANTOPRAZOLE SODIUM 40 MG PO TBEC
40.0000 mg | DELAYED_RELEASE_TABLET | Freq: Two times a day (BID) | ORAL | Status: DC
  Administered 2019-04-19 – 2019-04-20 (×2): 40 mg via ORAL
  Filled 2019-04-19 (×2): qty 1

## 2019-04-19 MED ORDER — IPRATROPIUM BROMIDE 0.06 % NA SOLN
1.0000 | Freq: Two times a day (BID) | NASAL | Status: DC
  Filled 2019-04-19: qty 15

## 2019-04-19 MED ORDER — ROSUVASTATIN CALCIUM 20 MG PO TABS
40.0000 mg | ORAL_TABLET | Freq: Every day | ORAL | Status: DC
  Administered 2019-04-19: 40 mg via ORAL
  Filled 2019-04-19: qty 2

## 2019-04-19 MED ORDER — ASPIRIN 81 MG PO CHEW
324.0000 mg | CHEWABLE_TABLET | Freq: Once | ORAL | Status: AC
  Administered 2019-04-19: 324 mg via ORAL
  Filled 2019-04-19: qty 4

## 2019-04-19 MED ORDER — LISINOPRIL 10 MG PO TABS
10.0000 mg | ORAL_TABLET | Freq: Every day | ORAL | Status: DC
  Administered 2019-04-19 – 2019-04-20 (×2): 10 mg via ORAL
  Filled 2019-04-19 (×2): qty 1

## 2019-04-19 MED ORDER — ZOLPIDEM TARTRATE 5 MG PO TABS: 5.0000 mg | ORAL_TABLET | Freq: Every evening | ORAL | Status: DC | PRN

## 2019-04-19 MED ORDER — SODIUM CHLORIDE 0.9% FLUSH
3.0000 mL | Freq: Two times a day (BID) | INTRAVENOUS | Status: DC
  Administered 2019-04-19: 3 mL via INTRAVENOUS

## 2019-04-19 MED ORDER — ACETAMINOPHEN 325 MG PO TABS: 650.0000 mg | ORAL_TABLET | ORAL | Status: DC | PRN

## 2019-04-19 MED ORDER — GLIMEPIRIDE 4 MG PO TABS
4.0000 mg | ORAL_TABLET | Freq: Every day | ORAL | Status: DC
  Administered 2019-04-20: 4 mg via ORAL
  Filled 2019-04-19: qty 1

## 2019-04-19 MED ORDER — ADULT MULTIVITAMIN W/MINERALS CH
1.0000 | ORAL_TABLET | Freq: Every day | ORAL | Status: DC
  Administered 2019-04-19 – 2019-04-20 (×2): 1 via ORAL
  Filled 2019-04-19 (×2): qty 1

## 2019-04-19 NOTE — ED Triage Notes (Signed)
Pt reports not feeling well, stating his chest has been hurting intermittently for the past few weeks. Epigastric pain after eating. Endorses SOB. States he tested positive for Covid but has been quarantined and released.

## 2019-04-19 NOTE — H&P (Addendum)
Cardiology Admission History and Physical:   Patient ID: Kirk [name]; MRN: [id number]; DOB: 04-Oct-1955   Admission date: 04/19/2019  Primary Care Provider: [name], FNP Primary Cardiologist: Lauree Chandler, MD 07/22/2018 Primary Electrophysiologist: None    Chief Complaint: Chest pain, palpitations  Patient Profile:   Kirk [name] is a 63 y.o. male with a history of NSTEMI 08/2015 s/p DES LAD, PAD with bilateral iliac stenting 2003, DM2, HTN, HLD, OA, Jehovah's Witness.  He came to the emergency room today with chest pain and palpitations, cardiology asked to evaluate him.  History of Present Illness:   Mr. Kirk [name] tested positive for Covid on 11/6.  He completed his quarantine, and has been asymptomatic.  He has gone back to work.  He has been having chest pain episodes for 2-3 weeks.  They occur after meals.  It is a pressure/tightness.  It can reach a 5/10.  He took some Mylanta once that helped briefly, but the pain came back.  The symptoms eventually resolve on their own.  Today, he was taking a shower, and had onset of palpitations, feeling his heart was racing.  He also began feeling the chest pain.  He took his usual morning medications, and the chest pain became worse.  He felt a little bit better, and was going on to work in [employer].  However, the palpitations got worse and he became a little lightheaded, felt like he was having some vision problems.  He became concerned and turned the car around and came back to [place] and drove to the emergency room.  He was having some shortness of breath with the chest pain.  The symptoms are similar to his MI symptoms, but the MI symptoms were more severe.  He was not having nausea, vomiting, or diaphoresis.  Currently, his chest pain has resolved.  The symptoms remind him of his MI symptoms in the type of pain, but his MI chest pain was more severe.  He has been exercising regularly, ambulating for 30 minutes  at a time, walking 1/2-1 mile 3 days a week without chest pain or shortness of breath.  Until the symptoms started a few weeks ago, he had not had any chest pain since his MI.  He has not been waking with lower extremity edema, no orthopnea or PND.  Until today, had not had any palpitations.  One of his concerns is that the Covid affected his heart.   Past Medical History:  Diagnosis Date   Arthritis    "right knee" (09/04/2015)   Atrial flutter with rapid ventricular response ([place]) 04/19/2019   HTN (hypertension)    Hyperlipidemia    NSTEMI (non-ST elevated myocardial infarction) ([place]) 09/04/2015   a. cath 09/05/2015: 95% stenosis mid-LAD, 55% mid-RCA, and 40% prox-RCA. PCI performed w/ Synergy DES to LAD   PAD (peripheral artery disease) (HCC)    Stenting of bilateral iliacs in 2003.   Refusal of blood transfusions as patient is Jehovah's Witness    Type II diabetes mellitus ([place])     Past Surgical History:  Procedure Laterality Date   CARDIAC CATHETERIZATION N/A 09/05/2015   Procedure: Left Heart Cath and Coronary Angiography;  Surgeon: Troy Sine, MD;  Location: [facility];  Service: Cardiovascular;  Laterality: N/A;   CARDIAC CATHETERIZATION N/A 09/05/2015   Procedure: Coronary Stent Intervention;  Surgeon: Troy Sine, MD;  Location: [facility];  Service: Cardiovascular;  Laterality: N/A;   ORIF CONGENITAL HIP DISLOCATION Left ~  1965   "put pins in it"   PERIPHERAL VASCULAR CATHETERIZATION Bilateral 2003   "stenting"   TONSILLECTOMY  ~ 1963     Medications Prior to Admission: Prior to Admission medications   Medication Sig Start Date End Date Taking? Authorizing Provider  aspirin EC 81 MG tablet Take 81 mg by mouth at bedtime.    [provider]  carvedilol (COREG) 6.25 MG tablet TAKE 1 TABLET BY MOUTH TWICE DAILY AFTER MEALS 10/12/18   Richardson Dopp T, PA-C  cetirizine (ZYRTEC) 10 MG tablet Take 1 tablet (10 mg total) by mouth  daily. Patient taking differently: Take 10 mg by mouth daily as needed for allergies or rhinitis.  07/08/18   [name], FNP  clopidogrel (PLAVIX) 75 MG tablet TAKE 1 TABLET BY MOUTH EVERY DAY Patient taking differently: Take 75 mg by mouth at bedtime.  08/17/18   Burnell Blanks, MD  glimepiride (AMARYL) 4 MG tablet Take 1 tablet (4 mg total) by mouth daily. 02/19/19   [name], FNP  HYDROcodone-homatropine [facility]) 5-1.5 MG/5ML syrup Take 5 mLs by mouth every 8 (eight) hours as needed for cough. 04/08/19   [name], FNP  ipratropium (ATROVENT) 0.03 % nasal spray USE 2 SPRAYS IN EACH NOSTRIL TWICE DAILY 01/13/19   [name], FNP  lisinopril (ZESTRIL) 10 MG tablet Take 1 tablet (10 mg total) by mouth daily. 02/19/19   [name], FNP  metFORMIN (GLUCOPHAGE) 1000 MG tablet Take 1 tablet (1,000 mg total) by mouth 2 (two) times daily with a meal. 03/29/19   [name], FNP  multivitamin (ONE-A-DAY MEN'S) TABS tablet Take 1 tablet by mouth daily. 11/11/18   [name], FNP  nitroGLYCERIN (NITROSTAT) 0.4 MG SL tablet Place 1 tablet (0.4 mg total) under the tongue every 5 (five) minutes x 3 doses as needed for chest pain. 08/26/17   Imogene Burn, PA-C  rosuvastatin (CRESTOR) 40 MG tablet TAKE 1 TABLET(40 MG) BY MOUTH DAILY Patient taking differently: Take 40 mg by mouth at bedtime.  09/30/18   Burnell Blanks, MD     Allergies:   No Known Allergies  Social History:   Social History   Socioeconomic History   Marital status: Divorced    Spouse name: Not on file   Number of children: 1   Years of education: Not on file   Highest education level: Not on file  Occupational History   Occupation: Education officer, community strain: Not on file   Food insecurity    Worry: Not on file    Inability: Not on file   Transportation needs    Medical: Not on file    Non-medical: Not on file  Tobacco Use     Smoking status: Former Smoker    Packs/day: 2.00    Years: 35.00    Pack years: 70.00    Types: Cigarettes    Quit date: 02/19/2003    Years since quitting: 16.1   Smokeless tobacco: Never Used  Substance and Sexual Activity   Alcohol use: Yes    Alcohol/week: 0.0 standard drinks    Comment: 09/04/2015 "nothing since 1980s"   Drug use: Yes    Types: Marijuana    Comment: "smoked reef in the 1970s"   Sexual activity: Not on file  Lifestyle   Physical activity    Days per week: Not on file    Minutes per session: Not on file  Stress: Not on file  Relationships   Social connections    Talks on phone: Not on file    Gets together: Not on file    Attends religious service: Not on file    Active member of club or organization: Not on file    Attends meetings of clubs or organizations: Not on file    Relationship status: Not on file   Intimate partner violence    Fear of current or ex partner: Not on file    Emotionally abused: Not on file    Physically abused: Not on file    Forced sexual activity: Not on file  Other Topics Concern   Not on file  Social History Narrative   Not on file    Family History:  The patient's family history includes Cancer - Prostate in his father; Lymphoma in his mother. There is no history of CAD.   The patient He indicated that his mother is deceased. He indicated that his father is deceased. He indicated that the status of his neg hx is unknown.    ROS:  Please see the history of present illness.  All other ROS reviewed and negative.     Physical Exam/Data:   Vitals:   04/19/19 1030  BP: 132/66  Pulse: 95  Resp: 18  Temp: 97.9 F (36.6 C)  TempSrc: Oral  SpO2: 98%   No intake or output data in the 24 hours ending 04/19/19 1302 There were no vitals filed for this visit. There is no height or weight on file to calculate BMI.  General:  Well nourished, well developed, male in no acute distress HEENT: normal Lymph: no  adenopathy Neck:  JVD not elevated Endocrine:  No thryomegaly Vascular: No carotid bruits; upper extremity pulses 2+ bilaterally, lower extremity pulses decreased but palpable Cardiac:  normal S1, S2; RRR; soft murmur, no rub or gallop  Lungs:  clear to auscultation bilaterally, no wheezing, rhonchi or rales  Abd: soft, nontender, no hepatomegaly  Ext: no edema Musculoskeletal:  No deformities, BUE and BLE strength normal and equal Skin: warm and dry  Neuro:  CNs 2-12 intact, no focal abnormalities noted Psych:  Normal affect    EKG:  The ECG was personally reviewed: 11/23 10:35 AM, sinus tach, heart rate 103, PAC, no acute ischemic changes 11/23 at 10:32 AM, atrial flutter, heart rate 140, mostly 2-1 conduction  Relevant CV Studies:  ECHO: 09/06/2015 - Left ventricle: The cavity size was normal. There was moderate   focal basal hypertrophy of the septum. Systolic function was   normal. The estimated ejection fraction was in the range of 60%   to 65%. Wall motion was normal; there were no regional wall   motion abnormalities. Doppler parameters are consistent with   abnormal left ventricular relaxation (grade 1 diastolic   dysfunction). The E/e&' ratio is between 8-15, suggesting   indeterminate LV filling pressure. - Aortic valve: Trileaflet. Cusp separation was normal. There was   no stenosis. Subvalvular LVOT acceleration is noted. There was no   regurgitation. - Mitral valve: SAM. Mildly thickened leaflets . There was trivial   regurgitation. - Left atrium: The atrium was normal in size. - Inferior vena cava: The vessel was normal in size. The   respirophasic diameter changes were in the normal range (>= 50%),   consistent with normal central venous pressure.  Impressions:  - LVEF 60-65%, moderate basal septal hypertrophy with SAM and LVOT   acceleration, normal wall motion, diastolic dysfunction  with   indeterminate LV filling pressure, normal LA size, normal  IVC.   MYOVIEW: 09/03/2017  The left ventricular ejection fraction is hyperdynamic (>65%).  Nuclear stress EF: 66%.  Blood pressure demonstrated a normal response to exercise.  Upsloping ST segment depression ST segment depression was noted during stress.  No T wave inversion was noted during stress.  Defect 1: There is a small defect of mild severity present in the mid inferior and apical inferior location. This may represent mild ischemia or diaphragmatic attenuation. Given the normal systolic function in this distribution, favor attenuation artifact.  This is a low risk study.  CATH: 09/05/2015  Ost LM to LM lesion, 25% stenosed.  Mid Cx lesion, 30% stenosed.  Dist Cx lesion, 30% stenosed.  Prox RCA lesion, 40% stenosed.  Mid RCA lesion, 55% stenosed.  Mid LAD lesion, 95% stenosed. Post intervention, there is a 0% residual stenosis.   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  LVEDP 9 mm Hg.  Successful PCI to the LAD with ultimate insertion of a 3.528 mm Synergy DES stent postdilated to 4.11 mm at the site of stenosis with initial residual waist 4.03 mm at other sites with the 95% stenoses being reduced to 0% and brisk TIMI-3 flow.  Recommendation: The patient will continue with dual antiplatelet therapy ideally indefinitely.  A 2-D echo Doppler study will be performed to assess LV function.  Patient will be treated with high potency statin, ACE inhibitor, beta blocker therapy and possibly nitrate therapy. Intervention     ABIs: 08/06/2017  ABI/TBI Today's ABI Today's TBI Previous ABI Previous TBI  +-------+-----------+-----------+------------+------------+  Right   0.63        0.39        0.62          0.45          +-------+-----------+-----------+------------+------------+  Left    0.61        0.42        0.64         0.45          +-------+-----------+-----------+------------+------------+  Bilateral ABIs appear essentially unchanged compared to prior study on 10/14.   Final Interpretation: Right: Resting right ankle-brachial index indicates moderate right lower extremity arterial disease. The right toe-brachial index is abnormal. PPG tracings appear dampened. Left: Resting left ankle-brachial index indicates moderate left lower extremity arterial disease. The left toe-brachial index is abnormal.   Laboratory Data:  Chemistry Recent Labs  Lab 04/19/19 1046  NA 135  K 4.3  CL 100  CO2 25  GLUCOSE 272*  BUN 11  CREATININE 1.08  CALCIUM 9.2  GFRNONAA >60  GFRAA >60  ANIONGAP 10    Recent Labs  Lab 04/19/19 1120  PROT 8.1  ALBUMIN 3.5  AST 18  ALT 21  ALKPHOS 58  BILITOT 0.4   Hematology Recent Labs  Lab 04/19/19 1046  WBC 9.2  RBC 4.42  HGB 12.5*  HCT 39.5  MCV 89.4  MCH 28.3  MCHC 31.6  RDW 12.7  PLT 434*   Cardiac Enzymes   High Sensitivity Troponin:   Recent Labs  Lab 04/19/19 1046  TROPONINIHS 5       Lab Results  Component Value Date  CHOL 114 07/22/2018   HDL 32 (L) 07/22/2018   LDLCALC 60 07/22/2018   TRIG 111 07/22/2018   CHOLHDL 3.6 07/22/2018    Radiology/Studies:  Dg Chest Portable 1 View  Result Date: 04/19/2019 CLINICAL DATA:  Chest pain.  Recently tested positive for COVID-19. EXAM: PORTABLE CHEST 1 VIEW COMPARISON:  Chest x-ray dated Oct 01, 2018. FINDINGS: The heart size and mediastinal contours are within normal limits. Both lungs are clear. The visualized skeletal structures are unremarkable. IMPRESSION: No active disease. Electronically Signed   By: Titus Dubin M.D.   On: 04/19/2019 11:30    Assessment and Plan:    1.  Chest pain: -He has not been having exertional chest pain. - the episode today was  associated with palpitations, feel like this was demand ischemia in the setting of an extremely high heart rate -However, all other episodes came after meals -We will start twice daily PPI and follow symptoms, may need Carafate as well - H&H are stable, do not think he is losing blood.   2.  Atrial flutter, rapid ventricular response -He spontaneously converted to sinus rhythm, but has had some short runs of atrial fib/flutter in the ER -When he had atrial flutter in the ER, the ER MD was present and he told her that it reproduced his palpitations and chest pain -CHA2DS2-VASc is 4 (age x 1, DM, HTN, CAD) -He is a candidate for anticoagulation, start heparin until the need for invasive work-up has been been ruled out.  Then add DOAC - continue Coreg, add Dilt 120 to try and keep him in SR   3.  History of CAD: -2017 cath diagram above - med rx for non-obstructive dz - continue ASA, but since 3 yrs out from PCI, can hold Plavix - continue BB/ACE/statin  4. Diabetes - hold metformin, continue other meds and add SSI  Otherwise, continue home meds. Principal Problem:   Chest pain with moderate risk for cardiac etiology Active Problems:   HTN (hypertension)   Hyperlipidemia   Diabetes mellitus with nephropathy (HCC)   Atrial flutter with rapid ventricular response ([place])   For questions or updates, please contact [facility] Please consult [email or web address] for contact info under Cardiology/STEMI.    Signed, Rosaria Ferries, PA-C  04/19/2019 1:02 PM   I have personally seen and examined this patient with Rosaria Ferries, PA-C. I agree with the assessment and plan as outlined above.   63 yo male with history of CAD with prior coronary stenting, PAD, DM, HTN, HLD who presents to the ED with c/o palpitations and chest pressure. Cardiac cath 09/05/15 with high grade mid LAD stenosis treated with a drug eluting stent x 1. There was mild to moderate non-obstructive disease in the Circumflex  and RCA. Echo April 2017 with normal LV systolic function. He is a former smoker. He was seen in our office April 2019 with c/o dyspnea. Nuclear stress test April 2019 with no ischemia.  He was in sinus when he arrived in the ED. He converted to atrial flutter with RVR while being observed in the ED. No prior history of atrial fib/flutter. He describes having pressure in his chest after swallowing for several weeks. No exertional chest pain or pressures. He tested positive for Covid 19 on 04/02/19. He has had dyspnea for the past several weeks with coughing and fevers. Chest x-ray is clear. EKG without ischemic changes. I reviewed both the EKG with atrial flutter which was at 140 bpm and the EKG when he  was in sinus which showed sinus with PACs.  Labs reviewed by me  My exam:  General: Well developed, well nourished, NAD  HEENT: OP clear, mucus membranes moist  SKIN: warm, dry. No rashes.  Neuro: No focal deficits  Musculoskeletal: Muscle strength 5/5 all ext  Psychiatric: Mood and affect normal  Neck: No JVD, no carotid bruits, no thyromegaly, no lymphadenopathy.  Lungs:Clear bilaterally, no wheezes, rhonci, crackles  Cardiovascular: Regular rate and rhythm. No murmurs, gallops or rubs.  Abdomen:Soft. Bowel sounds present. Non-tender.  Extremities: No lower extremity edema. Pulses are 2 + in the bilateral DP/PT.   Plan:  Chest pressure seems atypical and occurs when his is having atrial flutter and after eating. Will cycle troponin. I would only pursue an ischemic evaluation if his troponin becomes elevated.  Atrial flutter with RVR: Will continue beta blocker and add Cardizem. Monitor on telemetry. He will need long term anti-coagulation. Will arrange an echo.   Lauree Chandler  04/19/2019 1:23 PM

## 2019-04-19 NOTE — Progress Notes (Signed)
ANTICOAGULATION CONSULT NOTE   Pharmacy Consult for Heparin Indication: Afib/flutter  No Known Allergies  Patient Measurements: Height: 6\' 3"  (190.5 cm) Weight: 225 lb (102.1 kg) IBW/kg (Calculated) : 84.5 Heparin Dosing Weight: 102.1 kg   Vital Signs: Temp: 98.3 F (36.8 C) (11/23 1708) Temp Source: Oral (11/23 1708) BP: 136/61 (11/23 1857) Pulse Rate: 80 (11/23 1857)  Labs: Recent Labs    04/19/19 1046 04/19/19 1357 04/19/19 1614 04/19/19 1749 04/19/19 2304  HGB 12.5*  --   --   --   --   HCT 39.5  --   --   --   --   PLT 434*  --   --   --   --   APTT  --   --  27  --   --   LABPROT  --   --  14.1  --   --   INR  --   --  1.1  --   --   HEPARINUNFRC  --   --   --   --  0.57  CREATININE 1.08  --   --   --   --   TROPONINIHS 5 6 5 6   --     Estimated Creatinine Clearance: 88.3 mL/min (by C-G formula based on SCr of 1.08 mg/dL).   Medical History: Past Medical History:  Diagnosis Date  . Arthritis    "right knee" (09/04/2015)  . Atrial flutter with rapid ventricular response ([place]) 04/19/2019  . HTN (hypertension)   . Hyperlipidemia   . NSTEMI (non-ST elevated myocardial infarction) ([place]) 09/04/2015   a. cath 09/05/2015: 95% stenosis mid-LAD, 55% mid-RCA, and 40% prox-RCA. PCI performed w/ Synergy DES to LAD  . PAD (peripheral artery disease) (HCC)    Stenting of bilateral iliacs in 2003.  [name] Refusal of blood transfusions as patient is Jehovah's Witness   . Type II diabetes mellitus (HCC)     Medications:  Medications Prior to Admission  Medication Sig Dispense Refill Last Dose  . acetaminophen (TYLENOL) 500 MG tablet Take 500 mg by mouth every 6 (six) hours as needed for fever.   Past Week at Unknown time  . aspirin EC 81 MG tablet Take 81 mg by mouth at bedtime.   04/18/2019 at pm  . carvedilol (COREG) 6.25 MG tablet TAKE 1 TABLET BY MOUTH TWICE DAILY AFTER MEALS (Patient taking differently: Take 6.25 mg by mouth 2 (two) times daily with a meal. ) 180  tablet 2 04/19/2019 at 830  . cetirizine (ZYRTEC) 10 MG tablet Take 1 tablet (10 mg total) by mouth daily. (Patient taking differently: Take 10 mg by mouth daily as needed for allergies or rhinitis. ) 30 tablet 11 couple days ago  . clopidogrel (PLAVIX) 75 MG tablet TAKE 1 TABLET BY MOUTH EVERY DAY (Patient taking differently: Take 75 mg by mouth at bedtime. ) 30 tablet 10 04/18/2019 at pm  . glimepiride (AMARYL) 4 MG tablet Take 1 tablet (4 mg total) by mouth daily. (Patient taking differently: Take 4 mg by mouth at bedtime. ) 90 tablet 1 04/18/2019 at pm  . HYDROcodone-homatropine (HYCODAN) 5-1.5 MG/5ML syrup Take 5 mLs by mouth every 8 (eight) hours as needed for cough. 120 mL 0 04/18/2019 at pm  . ipratropium (ATROVENT) 0.03 % nasal spray USE 2 SPRAYS IN EACH NOSTRIL TWICE DAILY (Patient taking differently: Place 2 sprays into both nostrils 2 (two) times daily as needed for rhinitis. ) 30 mL 2 week ago  . lisinopril (ZESTRIL)  10 MG tablet Take 1 tablet (10 mg total) by mouth daily. (Patient taking differently: Take 10 mg by mouth at bedtime. ) 90 tablet 1 04/18/2019 at pm  . metFORMIN (GLUCOPHAGE) 1000 MG tablet Take 1 tablet (1,000 mg total) by mouth 2 (two) times daily with a meal. 180 tablet 1 04/19/2019 at am  . naproxen sodium (ALEVE) 220 MG tablet Take 660 mg by mouth daily as needed (pain).   few weeks ago  . nitroGLYCERIN (NITROSTAT) 0.4 MG SL tablet Place 1 tablet (0.4 mg total) under the tongue every 5 (five) minutes x 3 doses as needed for chest pain. 25 tablet 2 never taken  . rosuvastatin (CRESTOR) 40 MG tablet TAKE 1 TABLET(40 MG) BY MOUTH DAILY (Patient taking differently: Take 40 mg by mouth at bedtime. ) 90 tablet 2 04/18/2019 at pm  . multivitamin (ONE-A-DAY MEN'S) TABS tablet Take 1 tablet by mouth daily. (Patient not taking: Reported on 04/19/2019) 30 tablet 6 Not Taking at Unknown time   Scheduled:  . aspirin EC  81 mg Oral QHS  . carvedilol  6.25 mg Oral BID WC  . diltiazem   120 mg Oral Daily  . [START ON 04/20/2019] glimepiride  4 mg Oral Daily  . insulin aspart  0-15 Units Subcutaneous TID WC  . insulin aspart  0-5 Units Subcutaneous QHS  . ipratropium  1 spray Each Nare BID  . lisinopril  10 mg Oral Daily  . loratadine  10 mg Oral Daily  . multivitamin with minerals  1 tablet Oral Daily  . pantoprazole  40 mg Oral BID AC  . rosuvastatin  40 mg Oral QHS  . sodium chloride flush  3 mL Intravenous Q12H    Assessment: 63 yo male presented on 04/19/2019 with chest pain and palpitations. Pharmacy consulted to dose heparin for ACS. Troponin 6. CBC stable. No reported bleeding. No anticoagulation prior to admission.   Of note, patient also found to be in Afib/Aflutter in the ED. CHAD2-VASc score of 4.   11/23 PM update: Initial heparin level therapeutic   Goal of Therapy:  Heparin level 0.3-0.7 units/ml Monitor platelets by anticoagulation protocol: Yes   Plan:  Cont heparin at 1550 units/hr Confirmatory heparin level with AM labs  Narda Bonds, PharmD, [place] Pharmacist Phone: [phone]

## 2019-04-19 NOTE — Progress Notes (Addendum)
ANTICOAGULATION CONSULT NOTE - Initial Consult  Pharmacy Consult for heparin Indication: chest pain/ACS  No Known Allergies  Patient Measurements:   Heparin Dosing Weight: 102.1 kg   Vital Signs: Temp: 97.9 F (36.6 C) (11/23 1030) Temp Source: Oral (11/23 1030) BP: 132/66 (11/23 1030) Pulse Rate: 95 (11/23 1030)  Labs: Recent Labs    04/19/19 1046 04/19/19 1357  HGB 12.5*  --   HCT 39.5  --   PLT 434*  --   CREATININE 1.08  --   TROPONINIHS 5 6    CrCl cannot be calculated (Unknown ideal weight.).   Medical History: Past Medical History:  Diagnosis Date  . Arthritis    "right knee" (09/04/2015)  . Atrial flutter with rapid ventricular response ([place]) 04/19/2019  . HTN (hypertension)   . Hyperlipidemia   . NSTEMI (non-ST elevated myocardial infarction) ([place]) 09/04/2015   a. cath 09/05/2015: 95% stenosis mid-LAD, 55% mid-RCA, and 40% prox-RCA. PCI performed w/ Synergy DES to LAD  . PAD (peripheral artery disease) (HCC)    Stenting of bilateral iliacs in 2003.  [name] Refusal of blood transfusions as patient is Jehovah's Witness   . Type II diabetes mellitus (HCC)     Medications:  (Not in a hospital admission)  Scheduled:  . aspirin EC  81 mg Oral QHS  . carvedilol  6.25 mg Oral BID WC  . diltiazem  120 mg Oral Daily  . glimepiride  4 mg Oral Daily  . heparin  4,000 Units Intravenous Once  . insulin aspart  0-15 Units Subcutaneous TID WC  . insulin aspart  0-5 Units Subcutaneous QHS  . ipratropium  2 spray Each Nare BID  . lisinopril  10 mg Oral Daily  . loratadine  10 mg Oral Daily  . multivitamin  1 tablet Oral Daily  . pantoprazole  40 mg Oral BID AC  . rosuvastatin  40 mg Oral QHS  . sodium chloride flush  3 mL Intravenous Q12H    Assessment: 63 yo male presented on 04/19/2019 with chest pain and palpitations. Pharmacy consulted to dose heparin for ACS. Troponin 6. CBC stable. No reported bleeding. No anticoagulation prior to admission.   Of note,  patient also found to be in Afib/Aflutter in the ED. CHAD2-VASc score of 4.   Goal of Therapy:  Heparin level 0.3-0.7 units/ml Monitor platelets by anticoagulation protocol: Yes   Plan:  Heparin 4000 units x1 bolus Start heparin 1550 units/hr  Check heparin level at 2300 Monitor heparin level, CBC, and S/S of bleeding daily    Cristela Felt, PharmD PGY1 Pharmacy Resident [employer]: [phone]   04/19/2019,3:46 PM

## 2019-04-19 NOTE — Progress Notes (Signed)
    Spoke to Dr Carlyle Basques w/ ID about Mr Kilgore recent COVID infection.  Pt tested positive for COVID on 11/06. He completed quarantine, and had gone back to work. Has been asymptomatic from a COVID standpoint.  She advised that he does not need COVID testing. He is not contagious and can go to a regular tele floor.   He needs only routine precautions.   Rosaria Ferries, PA-C 04/19/2019 2:03 PM Bee[facility] [phone]

## 2019-04-19 NOTE — ED Provider Notes (Signed)
[facility] EMERGENCY DEPARTMENT Provider Note   CSN: [id number] Arrival date & time: 04/19/19  1024     History   Chief Complaint Chief Complaint  Patient presents with   Chest Pain    HPI Kirk [name] is a 63 y.o. male.     63yo M w/ PMH including CAD s/p stenting, PVD, T2DM, HTN, HLD who p/w multiple complaints including chest pain. Pt tested positive for COVID-19 on 11/6; his symptoms of cough and fevers have since resolved. Over the past several weeks, he has had intermittent episodes of heart racing as well as intermittent central chest pain and SOB.  He had an episode yesterday which eventually spontaneously resolved.  This morning shortly after he took his medications, he had an episode that was very severe and involved severe, squeezing central chest pain with shortness of breath.  It lasted approximately 20 minutes and then resolved.  He decided to drive on to work and while he was driving he began feeling dizzy with some blurry vision so he turned around and came to the ED.  Currently he denies any symptoms but he occasionally has the heart racing sensation while he is sitting here. Has had nausea and sweating at time. No vomiting. No alcohol or drug use.  The history is provided by the patient.  Chest Pain   Past Medical History:  Diagnosis Date   Arthritis    "right knee" (09/04/2015)   HTN (hypertension)    Hyperlipidemia    NSTEMI (non-ST elevated myocardial infarction) ([place]) 09/04/2015   a. cath 09/05/2015: 95% stenosis mid-LAD, 55% mid-RCA, and 40% prox-RCA. PCI performed w/ Synergy DES to LAD   PAD (peripheral artery disease) (HCC)    Stenting of bilateral iliacs in 2003.   Refusal of blood transfusions as patient is Jehovah's Witness    Type II diabetes mellitus ([place])     Patient Active Problem List   Diagnosis Date Noted   Chest congestion 07/10/2018   Cough 07/10/2018   NSTEMI (non-ST elevated myocardial infarction) ([place])  09/04/2015   PVD (peripheral vascular disease) ([place]) 06/08/2015   DDD (degenerative disc disease), cervical 06/08/2015   Hyperlipidemia 06/08/2015   Diabetes mellitus with nephropathy ([place]) 06/08/2015   Diabetes mellitus with peripheral vascular disease ([place]) 06/08/2015   Annual physical exam 01/03/2015   PAD (peripheral artery disease) ([place]) 02/18/2013   Diabetes ([place]) 02/18/2013   HTN (hypertension) 02/18/2013    Past Surgical History:  Procedure Laterality Date   CARDIAC CATHETERIZATION N/A 09/05/2015   Procedure: Left Heart Cath and Coronary Angiography;  Surgeon: Troy Sine, MD;  Location: [facility];  Service: Cardiovascular;  Laterality: N/A;   CARDIAC CATHETERIZATION N/A 09/05/2015   Procedure: Coronary Stent Intervention;  Surgeon: Troy Sine, MD;  Location:  [facility];  Service: Cardiovascular;  Laterality: N/A;   ORIF CONGENITAL HIP DISLOCATION Left ~ 1965   "put pins in it"   PERIPHERAL VASCULAR CATHETERIZATION Bilateral 2003   "stenting"   TONSILLECTOMY  ~ [place] Medications    Prior to Admission medications   Medication Sig Start Date End Date Taking? Authorizing Provider  aspirin EC 81 MG tablet Take 81 mg by mouth at bedtime.    [provider]  carvedilol (COREG) 6.25 MG tablet TAKE 1 TABLET BY MOUTH TWICE DAILY AFTER MEALS 10/12/18   Richardson Dopp T, PA-C  cetirizine (ZYRTEC) 10 MG tablet Take 1 tablet (10 mg total)  by mouth daily. Patient taking differently: Take 10 mg by mouth daily as needed for allergies or rhinitis.  07/08/18   [name], FNP  clopidogrel (PLAVIX) 75 MG tablet TAKE 1 TABLET BY MOUTH EVERY DAY Patient taking differently: Take 75 mg by mouth at bedtime.  08/17/18   Burnell Blanks, MD  glimepiride (AMARYL) 4 MG tablet Take 1 tablet (4 mg total) by mouth daily. 02/19/19   [name], FNP  HYDROcodone-homatropine [facility]) 5-1.5 MG/5ML syrup Take 5 mLs by mouth every 8  (eight) hours as needed for cough. 04/08/19   [name], FNP  ipratropium (ATROVENT) 0.03 % nasal spray USE 2 SPRAYS IN EACH NOSTRIL TWICE DAILY 01/13/19   [name], FNP  lisinopril (ZESTRIL) 10 MG tablet Take 1 tablet (10 mg total) by mouth daily. 02/19/19   [name], FNP  metFORMIN (GLUCOPHAGE) 1000 MG tablet Take 1 tablet (1,000 mg total) by mouth 2 (two) times daily with a meal. 03/29/19   [name], FNP  multivitamin (ONE-A-DAY MEN'S) TABS tablet Take 1 tablet by mouth daily. 11/11/18   [name], FNP  nitroGLYCERIN (NITROSTAT) 0.4 MG SL tablet Place 1 tablet (0.4 mg total) under the tongue every 5 (five) minutes x 3 doses as needed for chest pain. 08/26/17   Imogene Burn, PA-C  rosuvastatin (CRESTOR) 40 MG tablet TAKE 1 TABLET(40 MG) BY MOUTH DAILY Patient taking differently: Take 40 mg by mouth at bedtime.  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   Review of Systems Review of Systems  Cardiovascular: Positive for chest pain.   All other systems reviewed and are negative except that which was mentioned in HPI   Physical Exam Updated Vital Signs BP 132/66 (BP Location: Right Arm)    Pulse 95    Temp 97.9 F (36.6 C) (Oral)    Resp 18    SpO2 98%   Physical Exam Vitals signs and nursing note reviewed.    Constitutional:      General: He is not in acute distress.    Appearance: He is well-developed.  HENT:     Head: Normocephalic and atraumatic.  Eyes:     Conjunctiva/sclera: Conjunctivae normal.  Neck:     Musculoskeletal: Neck supple.  Cardiovascular:     Rate and Rhythm: Normal rate and regular rhythm.     Heart sounds: Normal heart sounds. No murmur.  Pulmonary:     Effort: Pulmonary effort is normal.     Breath sounds: Normal breath sounds.  Abdominal:     General: Bowel sounds are normal. There is no distension.     Palpations: Abdomen  is soft.     Tenderness: There is no abdominal tenderness.  Musculoskeletal:     Right lower leg: No edema.     Left lower leg: No edema.  Skin:    General: Skin is warm and dry.  Neurological:     Mental Status: He is alert and oriented to person, place, and time.     Comments: Fluent speech  Psychiatric:        Judgment: Judgment normal.      ED Treatments / Results  Labs (all labs ordered are listed, but only abnormal results are displayed) Labs Reviewed  BASIC METABOLIC PANEL - Abnormal; Notable for the following components:      Result Value   Glucose, Bld 272 (*)    All other components within normal limits  CBC - Abnormal; Notable for the following components:   Hemoglobin 12.5 (*)    Platelets 434 (*)    All other components within normal limits  LIPASE, BLOOD  HEPATIC FUNCTION PANEL  TROPONIN I (HIGH SENSITIVITY)  TROPONIN I (HIGH SENSITIVITY)    EKG EKG Interpretation  Date/Time:  Monday April 19 2019 10:35:18 EST Ventricular Rate:  103 PR Interval:  238 QRS Duration: 66 QT Interval:  324 QTC Calculation: 424 R Axis:   81 Text Interpretation: Sinus tachycardia with 1st degree A-V block with Premature atrial complexes Septal infarct , age undetermined Abnormal ECG tachycardia new from previous Confirmed by Theotis Burrow [phone]) on 04/19/2019 11:13:45 AM   Radiology Dg Chest Portable 1 View  Result  Date: 04/19/2019 CLINICAL DATA:  Chest pain.  Recently tested positive for COVID-19. EXAM: PORTABLE CHEST 1 VIEW COMPARISON:  Chest x-ray dated Oct 01, 2018. FINDINGS: The heart size and mediastinal contours are within normal limits. Both lungs are clear. The visualized skeletal structures are unremarkable. IMPRESSION: No active disease. Electronically Signed   By: Titus Dubin M.D.   On: 04/19/2019 11:30    Procedures Procedures (including critical care time)  Medications Ordered in ED Medications  aspirin chewable tablet 324 mg (has no administration in time range)     Initial Impression / Assessment and Plan / ED Course  I have reviewed the triage vital signs and the nursing notes.  Pertinent labs & imaging results that were available during my care of the patient were reviewed by me and considered in my medical decision making (see chart for details).       He was well-appearing on exam, initially normal vital signs with EKG showing sinus rhythm.  While I was talking with the patient, his heart rate suddenly jumped up to 140s and he stated that he was feeling palpitations and the same feeling that he was having intermittently at home.  Repeat EKG shows atrial flutter.  He has continued to intermittently jump into atrial flutter and then quickly back into sinus rhythm.  Lab work shows normal electrolytes, negative initial troponin, clear chest x-ray.  Suspect demand ischemia related to intermittent A flutter RVR. Consulted cardiology and patient evaluated by Suanne Marker as well as Dr. Angelena Form. They will admit for further w/u and treatment. Final Clinical Impressions(s) / ED Diagnoses   Final diagnoses:  Paroxysmal atrial flutter ([place])  Chest pain, unspecified type    ED Discharge Orders    None       Zamani Crocker, Wenda Overland, MD 04/19/19 1421

## 2019-04-20 ENCOUNTER — Observation Stay (HOSPITAL_BASED_OUTPATIENT_CLINIC_OR_DEPARTMENT_OTHER): Payer: Medicare Other

## 2019-04-20 ENCOUNTER — Encounter: Payer: Self-pay | Admitting: Nurse Practitioner

## 2019-04-20 ENCOUNTER — Other Ambulatory Visit: Payer: Self-pay | Admitting: Family Medicine

## 2019-04-20 ENCOUNTER — Telehealth: Payer: Self-pay | Admitting: Family Medicine

## 2019-04-20 DIAGNOSIS — I4892 Unspecified atrial flutter: Secondary | ICD-10-CM | POA: Diagnosis not present

## 2019-04-20 DIAGNOSIS — R079 Chest pain, unspecified: Secondary | ICD-10-CM | POA: Diagnosis not present

## 2019-04-20 LAB — COMPREHENSIVE METABOLIC PANEL
ALT: 17 U/L (ref 0–44)
AST: 15 U/L (ref 15–41)
Albumin: 3.1 g/dL — ABNORMAL LOW (ref 3.5–5.0)
Alkaline Phosphatase: 48 U/L (ref 38–126)
Anion gap: 9 (ref 5–15)
BUN: 13 mg/dL (ref 8–23)
CO2: 24 mmol/L (ref 22–32)
Calcium: 9 mg/dL (ref 8.9–10.3)
Chloride: 105 mmol/L (ref 98–111)
Creatinine, Ser: 1.12 mg/dL (ref 0.61–1.24)
GFR calc Af Amer: >60 mL/min (ref >60)
GFR calc non Af Amer: >60 mL/min (ref >60)
Glucose, Bld: 227 mg/dL — ABNORMAL HIGH (ref 70–99)
Potassium: 3.9 mmol/L (ref 3.5–5.1)
Sodium: 138 mmol/L (ref 135–145)
Total Bilirubin: 0.8 mg/dL (ref 0.3–1.2)
Total Protein: 6.8 g/dL (ref 6.5–8.1)

## 2019-04-20 LAB — CBC
HCT: 33.4 % — ABNORMAL LOW (ref 39.0–52.0)
Hemoglobin: 10.9 g/dL — ABNORMAL LOW (ref 13.0–17.0)
MCH: 28.4 pg (ref 26.0–34.0)
MCHC: 32.6 g/dL (ref 30.0–36.0)
MCV: 87 fL (ref 80.0–100.0)
Platelets: 367 10*3/uL (ref 150–400)
RBC: 3.84 MIL/uL — ABNORMAL LOW (ref 4.22–5.81)
RDW: 12.9 % (ref 11.5–15.5)
WBC: 7.3 10*3/uL (ref 4.0–10.5)
nRBC: 0 % (ref 0.0–0.2)

## 2019-04-20 LAB — ECHOCARDIOGRAM COMPLETE
Height: 75 in
Weight: 3600 oz

## 2019-04-20 LAB — GLUCOSE, CAPILLARY
Glucose-Capillary: 177 mg/dL — ABNORMAL HIGH (ref 70–99)
Glucose-Capillary: 134 mg/dL — ABNORMAL HIGH (ref 70–99)

## 2019-04-20 LAB — HEPARIN LEVEL (UNFRACTIONATED): Heparin Unfractionated: 0.64 IU/mL (ref 0.30–0.70)

## 2019-04-20 MED ORDER — APIXABAN 5 MG PO TABS
5.0000 mg | ORAL_TABLET | Freq: Two times a day (BID) | ORAL | Status: DC
  Administered 2019-04-20: 5 mg via ORAL
  Filled 2019-04-20: qty 1

## 2019-04-20 MED ORDER — DILTIAZEM HCL ER COATED BEADS 120 MG PO CP24: 120.0000 mg | ORAL_CAPSULE | Freq: Every day | ORAL | 1 refills | Status: DC

## 2019-04-20 MED ORDER — APIXABAN 5 MG PO TABS: 5.0000 mg | ORAL_TABLET | Freq: Two times a day (BID) | ORAL | 1 refills | Status: DC

## 2019-04-20 NOTE — Telephone Encounter (Signed)
Called and spoke with patient, he has already been contacted by GI. Thanks !

## 2019-04-20 NOTE — Discharge Instructions (Signed)

## 2019-04-20 NOTE — Discharge Summary (Signed)
Discharge Summary    Patient ID: Kirk [name],  MRN: [id number], DOB/AGE: Jun 26, 1955 63 y.o.  Admit date: 04/19/2019 Discharge date: 04/20/2019  Primary Care Provider: [name] Primary Cardiologist: Lauree Chandler, MD  Discharge Diagnoses    Principal Problem:   Chest pain with moderate risk for cardiac etiology Active Problems:   HTN (hypertension)   Hyperlipidemia   Diabetes mellitus with nephropathy (Kirk [name])   Atrial flutter with rapid ventricular response (HCC)   Paroxysmal atrial flutter (Kirk [name])   Allergies No Known Allergies  Diagnostic Studies/Procedures    TTE: 04/20/19  Pending at the time of discharge. Reviewed by Dr. Angelena Form prior to discharge.  _____________   History of Present Illness     63 y.o. male with a history of NSTEMI 08/2015 s/p DES LAD, PAD with bilateral iliac stenting 2003, DM2, HTN, HLD, OA, Jehovah's Witness. Kirk [name] tested positive for Covid on 11/6.  He completed his quarantine, and has been asymptomatic.  He had gone back to work.  He has been having chest pain episodes for 2-3 weeks.  They occur after meals.  It is a pressure/tightness.  It can reach a 5/10.  He took some Mylanta once that helped briefly, but the pain came back.  The symptoms eventually resolve on their own.  The day of admission he was taking a shower, and had onset of palpitations, feeling his heart was racing.  He also began feeling the chest pain.  He took his usual morning medications, and the chest pain became worse.  He felt a little bit better, and was going on to work in [employer].  However, the palpitations got worse and he became a little lightheaded, felt like he was having some vision problems.  He became concerned and turned the car around and came back to [place] and drove to the emergency room.  He was having some shortness of breath with the chest pain.  The symptoms were similar to his MI symptoms, but the MI symptoms were more  severe.  He was not having nausea, vomiting, or diaphoresis. The symptoms remind him of his MI symptoms in the type of pain, but his MI chest pain was more severe.  He has been exercising regularly, ambulating for 30 minutes at a time, walking 1/2-1 mile 3 days a week without chest pain or shortness of breath. Until the symptoms started a few weeks ago, he had not had any chest pain since his MI.  In the ED he was found was found to be in rapid Aflutter but spontaneously to SR with a few short bursts of afib/flutter.   Hospital Course     He was admitted and started on IV heparin until troponin trend and plans for invasive work up determined. Add Dilt 120mg  daily in addition to his home coreg to help keep in SR. He ruled out with enzymes. Remained in SR overnight and tolerated the addition of Dilt. No chest pain the following morning. Switched from IV heparin to Eliquis 5mg  BID. In talking with him, he reported his chest pain was present mostly after eating and getting the sensation of food stuck in his throat. Instructed will need to follow up with GI is symptoms persist. Received meds from [facility]. Educated by PharmD prior to discharge.   General: Well developed, well nourished, male appearing in no acute distress. Head: Normocephalic, atraumatic.  Neck: Supple without bruits, JVD. Lungs:  Resp regular and unlabored, CTA. Heart: RRR, S1, S2, no  S3, S4, or murmur; no rub. Abdomen: Soft, non-tender, non-distended with normoactive bowel sounds. No hepatomegaly. No rebound/guarding. No obvious abdominal masses. Extremities: No clubbing, cyanosis, edema. Distal pedal pulses are 2+ bilaterally. Neuro: Alert and oriented X 3. Moves all extremities spontaneously. Psych: Normal affect.  Kirk [name] was seen by Dr. Angelena Form and determined stable for discharge home. Follow up in the office has been arranged. Medications are listed below.   _____________  Discharge Vitals Blood pressure  115/61, pulse 72, temperature 98.1 F (36.7 C), temperature source Oral, resp. rate 20, height 6\' 3"  (1.905 m), weight 102.1 kg, SpO2 96 %.  Filed Weights   04/19/19 1500  Weight: 102.1 kg    Labs & Radiologic Studies    CBC Recent Labs    04/19/19 1046 04/20/19 0406  WBC 9.2 7.3  HGB 12.5* 10.9*  HCT 39.5 33.4*  MCV 89.4 87.0  PLT 434* 485   Basic Metabolic Panel Recent Labs    04/19/19 1046 04/20/19 0406  NA 135 138  K 4.3 3.9  CL 100 105  CO2 25 24  GLUCOSE 272* 227*  BUN 11 13  CREATININE 1.08 1.12  CALCIUM 9.2 9.0   Liver Function Tests Recent Labs    04/19/19 1120 04/20/19 0406  AST 18 15  ALT 21 17  ALKPHOS 58 48  BILITOT 0.4 0.8  PROT 8.1 6.8  ALBUMIN 3.5 3.1*   Recent Labs    04/19/19 1120  LIPASE 39   Cardiac Enzymes No results for input(s): CKTOTAL, CKMB, CKMBINDEX, TROPONINI in the last 72 hours. BNP Invalid input(s): POCBNP D-Dimer No results for input(s): DDIMER in the last 72 hours. Hemoglobin A1C Recent Labs    04/19/19 1614  HGBA1C 8.2*   Fasting Lipid Panel No results for input(s): CHOL, HDL, LDLCALC, TRIG, CHOLHDL, LDLDIRECT in the last 72 hours. Thyroid Function Tests Recent Labs    04/19/19 1614  TSH 0.367   _____________  Dg Chest Portable 1 View  Result Date: 04/19/2019 CLINICAL DATA:  Chest pain.  Recently tested positive for COVID-19. EXAM: PORTABLE CHEST 1 VIEW COMPARISON:  Chest x-ray dated Oct 01, 2018. FINDINGS: The heart size and mediastinal contours are within normal limits. Both lungs are clear. The visualized skeletal structures are unremarkable. IMPRESSION: No active disease. Electronically Signed   By: Titus Dubin M.D.   On: 04/19/2019 11:30   Disposition   Pt is being discharged home today in good condition.  Follow-up Plans & Appointments    Follow-up Information    Imogene Burn, PA-C Follow up on 05/04/2019.   Specialty: Cardiology Why: at 2pm for your follow up appt.  Contact  information: [address] [place] [address] [phone]        [name], FNP Follow up.   Specialty: Family Medicine Contact information: [place] [address] [phone]        Burnell Blanks, MD .   Specialty: Cardiology Contact information: [place] [place]  [address] [phone]          Discharge Instructions    Ambulatory referral to Gastroenterology   Complete by: As directed    What is the reason for referral?: Other   Diet - low sodium heart healthy   Complete by: As directed    Discharge instructions   Complete by: As directed    If you notice any bleeding such as blood in stool, black tarry stools, blood  in urine, nosebleeds or any other unusual bleeding, call your doctor immediately. It is not normal to have this kind of bleeding while on a blood thinner and usually indicates there is an underlying problem with one of your body systems that needs to be checked out.   Increase activity slowly   Complete by: As directed       Discharge Medications     Medication List    STOP taking these medications   clopidogrel 75 MG tablet Commonly known as: PLAVIX   naproxen sodium 220 MG tablet Commonly known as: ALEVE     TAKE these medications   acetaminophen 500 MG tablet Commonly known as: TYLENOL Take 500 mg by mouth every 6 (six) hours as needed for fever.   apixaban 5 MG Tabs tablet Commonly known as: ELIQUIS Take 1 tablet (5 mg total) by mouth 2 (two) times daily.   aspirin EC 81 MG tablet Take 81 mg by mouth at bedtime.   carvedilol 6.25 MG tablet Commonly known as: COREG TAKE 1 TABLET BY MOUTH TWICE DAILY AFTER MEALS What changed: See the new instructions.   cetirizine 10 MG tablet Commonly known as: ZYRTEC Take 1 tablet (10 mg total) by mouth daily. What changed:   when to take this  reasons to take this   diltiazem 120 MG 24 hr capsule Commonly  known as: CARDIZEM CD Take 1 capsule (120 mg total) by mouth daily.   glimepiride 4 MG tablet Commonly known as: AMARYL Take 1 tablet (4 mg total) by mouth daily. What changed: when to take this   HYDROcodone-homatropine 5-1.5 MG/5ML syrup Commonly known as: HYCODAN Take 5 mLs by mouth every 8 (eight) hours as needed for cough.   ipratropium 0.03 % nasal spray Commonly known as: ATROVENT USE 2 SPRAYS IN EACH NOSTRIL TWICE DAILY What changed:   when to take this  reasons to take this   lisinopril 10 MG tablet Commonly known as: ZESTRIL Take 1 tablet (10 mg total) by mouth daily. What changed: when to take this   metFORMIN 1000 MG tablet Commonly known as: GLUCOPHAGE Take 1 tablet (1,000 mg total) by mouth 2 (two) times daily with a meal.   multivitamin Tabs tablet Take 1 tablet by mouth daily.   nitroGLYCERIN 0.4 MG SL tablet Commonly known as: NITROSTAT Place 1 tablet (0.4 mg total) under the tongue every 5 (five) minutes x 3 doses as needed for chest pain.   rosuvastatin 40 MG tablet Commonly known as: CRESTOR TAKE 1 TABLET(40 MG) BY MOUTH DAILY What changed: See the new instructions.        No                               Did the patient have a percutaneous coronary intervention (stent / angioplasty)?:  No.      Outstanding Labs/Studies   N/a   Duration of Discharge Encounter   Greater than 30 minutes including physician time.  Signed, Reino Bellis NP-C 04/20/2019, 10:33 AM   I have personally seen and examined this patient. I agree with the assessment and plan as outlined above.  No recurrence of atrial flutter overnight. Tolerating Cardizem. He has been started on Eliquis.  Will stop Plavix.  Echo with normal LV function. No significant valve disease (will be officially read later) He has chest pain after swallowing. I suspect he has an esophageal stricture or hiatal hernia. Will need outpatient  GI referral.  Will d/c home today.    Lauree Chandler 04/20/2019 10:33 AM

## 2019-04-20 NOTE — Progress Notes (Addendum)
[place] for Heparin>Apixaban Indication: Afib/flutter  No Known Allergies  Patient Measurements: Height: 6\' 3"  (190.5 cm) Weight: 225 lb (102.1 kg) IBW/kg (Calculated) : 84.5 Heparin Dosing Weight: 102.1 kg   Vital Signs: Temp: 98.1 F (36.7 C) (11/24 0543) Temp Source: Oral (11/24 0543) BP: 115/61 (11/24 0543) Pulse Rate: 72 (11/24 0543)  Labs: Recent Labs    04/19/19 1046 04/19/19 1357 04/19/19 1614 04/19/19 1749 04/19/19 2304 04/20/19 0406  HGB 12.5*  --   --   --   --  10.9*  HCT 39.5  --   --   --   --  33.4*  PLT 434*  --   --   --   --  367  APTT  --   --  27  --   --   --   LABPROT  --   --  14.1  --   --   --   INR  --   --  1.1  --   --   --   HEPARINUNFRC  --   --   --   --  0.57 0.64  CREATININE 1.08  --   --   --   --  1.12  TROPONINIHS 5 6 5 6   --   --     Estimated Creatinine Clearance: 85.1 mL/min (by C-G formula based on SCr of 1.12 mg/dL).   Medical History: Past Medical History:  Diagnosis Date  . Arthritis    "right knee" (09/04/2015)  . Atrial flutter with rapid ventricular response ([place]) 04/19/2019  . HTN (hypertension)   . Hyperlipidemia   . NSTEMI (non-ST elevated myocardial infarction) ([place]) 09/04/2015   a. cath 09/05/2015: 95% stenosis mid-LAD, 55% mid-RCA, and 40% prox-RCA. PCI performed w/ Synergy DES to LAD  . PAD (peripheral artery disease) (HCC)    Stenting of bilateral iliacs in 2003.  [name] Refusal of blood transfusions as patient is Jehovah's Witness   . Type II diabetes mellitus (HCC)     Medications:  Medications Prior to Admission  Medication Sig Dispense Refill Last Dose  . acetaminophen (TYLENOL) 500 MG tablet Take 500 mg by mouth every 6 (six) hours as needed for fever.   Past Week at Unknown time  . aspirin EC 81 MG tablet Take 81 mg by mouth at bedtime.   04/18/2019 at pm  . carvedilol (COREG) 6.25 MG tablet TAKE 1 TABLET BY MOUTH TWICE DAILY AFTER MEALS (Patient taking  differently: Take 6.25 mg by mouth 2 (two) times daily with a meal. ) 180 tablet 2 04/19/2019 at 830  . cetirizine (ZYRTEC) 10 MG tablet Take 1 tablet (10 mg total) by mouth daily. (Patient taking differently: Take 10 mg by mouth daily as needed for allergies or rhinitis. ) 30 tablet 11 couple days ago  . clopidogrel (PLAVIX) 75 MG tablet TAKE 1 TABLET BY MOUTH EVERY DAY (Patient taking differently: Take 75 mg by mouth at bedtime. ) 30 tablet 10 04/18/2019 at pm  . glimepiride (AMARYL) 4 MG tablet Take 1 tablet (4 mg total) by mouth daily. (Patient taking differently: Take 4 mg by mouth at bedtime. ) 90 tablet 1 04/18/2019 at pm  . HYDROcodone-homatropine (HYCODAN) 5-1.5 MG/5ML syrup Take 5 mLs by mouth every 8 (eight) hours as needed for cough. 120 mL 0 04/18/2019 at pm  . ipratropium (ATROVENT) 0.03 % nasal spray USE 2 SPRAYS IN EACH NOSTRIL TWICE DAILY (Patient taking differently: Place 2 sprays into both  nostrils 2 (two) times daily as needed for rhinitis. ) 30 mL 2 week ago  . lisinopril (ZESTRIL) 10 MG tablet Take 1 tablet (10 mg total) by mouth daily. (Patient taking differently: Take 10 mg by mouth at bedtime. ) 90 tablet 1 04/18/2019 at pm  . metFORMIN (GLUCOPHAGE) 1000 MG tablet Take 1 tablet (1,000 mg total) by mouth 2 (two) times daily with a meal. 180 tablet 1 04/19/2019 at am  . naproxen sodium (ALEVE) 220 MG tablet Take 660 mg by mouth daily as needed (pain).   few weeks ago  . nitroGLYCERIN (NITROSTAT) 0.4 MG SL tablet Place 1 tablet (0.4 mg total) under the tongue every 5 (five) minutes x 3 doses as needed for chest pain. 25 tablet 2 never taken  . rosuvastatin (CRESTOR) 40 MG tablet TAKE 1 TABLET(40 MG) BY MOUTH DAILY (Patient taking differently: Take 40 mg by mouth at bedtime. ) 90 tablet 2 04/18/2019 at pm  . multivitamin (ONE-A-DAY MEN'S) TABS tablet Take 1 tablet by mouth daily. (Patient not taking: Reported on 04/19/2019) 30 tablet 6 Not Taking at Unknown time   Scheduled:  .  aspirin EC  81 mg Oral QHS  . carvedilol  6.25 mg Oral BID WC  . diltiazem  120 mg Oral Daily  . glimepiride  4 mg Oral Daily  . insulin aspart  0-15 Units Subcutaneous TID WC  . insulin aspart  0-5 Units Subcutaneous QHS  . ipratropium  1 spray Each Nare BID  . lisinopril  10 mg Oral Daily  . loratadine  10 mg Oral Daily  . multivitamin with minerals  1 tablet Oral Daily  . pantoprazole  40 mg Oral BID AC  . rosuvastatin  40 mg Oral QHS  . sodium chloride flush  3 mL Intravenous Q12H    Assessment: 63 yo male presented on 04/19/2019 with chest pain and palpitations. Pharmacy consulted to dose heparin for ACS. Troponin 6. Hgb 10.9, Plts 367. No anticoagulation prior to admission.   Confirmatory Heparin level therapeutic at 0.64 - switch heparin gtt>>apixaban 5 mg BID - Nurse is aware. No history or issues of bleeding noted. (Age 63 yo, Scr < 1.5, Wt > 60kg)  Goal of Therapy:  Heparin level 0.3-0.7 units/ml Monitor platelets by anticoagulation protocol: Yes   Plan:  Stop heparin and start Apixaban 5 mg BID Complete education and AVS Monitor for s/sx of bleeding  Lorel Monaco, PharmD PGY1 Ambulatory Care Resident [employer] # [phone]

## 2019-04-20 NOTE — Progress Notes (Signed)
  Echocardiogram 2D Echocardiogram has been performed.  [name] A Kolin Erdahl 04/20/2019, 9:44 AM

## 2019-04-20 NOTE — Progress Notes (Signed)
Alisa Graff to be D/C'd Home per MD order.  Discussed with the patient and all questions fully answered.   VSS, Skin clean, dry and intact without evidence of skin break down, no evidence of skin tears noted. IV catheter discontinued intact. Site without signs and symptoms of complications. Dressing and pressure applied.   An After Visit Summary was printed and given to the patient.    D/C education completed with patient/family including follow up instructions, medication list, d/c activities limitations if indicated, with other d/c instructions as indicated by MD - patient able to verbalize understanding, all questions fully answered.    Patient instructed to return to ED, call 911, or call MD for any changes in condition.    Patient escorted via [place], and D/C home via private car.

## 2019-05-04 ENCOUNTER — Ambulatory Visit: Payer: Medicare Other | Admitting: Physician Assistant

## 2019-05-10 ENCOUNTER — Ambulatory Visit (INDEPENDENT_AMBULATORY_CARE_PROVIDER_SITE_OTHER): Payer: Medicare Other | Admitting: Family Medicine

## 2019-05-10 ENCOUNTER — Other Ambulatory Visit: Payer: Self-pay

## 2019-05-10 DIAGNOSIS — Z09 Encounter for follow-up examination after completed treatment for conditions other than malignant neoplasm: Secondary | ICD-10-CM

## 2019-05-10 DIAGNOSIS — R7303 Prediabetes: Secondary | ICD-10-CM | POA: Diagnosis not present

## 2019-05-10 DIAGNOSIS — R05 Cough: Secondary | ICD-10-CM | POA: Diagnosis not present

## 2019-05-10 DIAGNOSIS — E1151 Type 2 diabetes mellitus with diabetic peripheral angiopathy without gangrene: Secondary | ICD-10-CM | POA: Diagnosis not present

## 2019-05-10 DIAGNOSIS — R059 Cough, unspecified: Secondary | ICD-10-CM

## 2019-05-10 DIAGNOSIS — E119 Type 2 diabetes mellitus without complications: Secondary | ICD-10-CM | POA: Insufficient documentation

## 2019-05-10 DIAGNOSIS — U071 COVID-19: Secondary | ICD-10-CM | POA: Insufficient documentation

## 2019-05-10 DIAGNOSIS — R0989 Other specified symptoms and signs involving the circulatory and respiratory systems: Secondary | ICD-10-CM

## 2019-05-10 MED ORDER — HYDROCORTISONE ACETATE 1 % EX CREA: 15.0000 g | TOPICAL_CREAM | Freq: Two times a day (BID) | CUTANEOUS | 3 refills | Status: DC

## 2019-05-10 MED ORDER — HYDROCODONE-HOMATROPINE 5-1.5 MG/5ML PO SYRP: 5.0000 mL | ORAL_SOLUTION | Freq: Three times a day (TID) | ORAL | 0 refills | Status: DC | PRN

## 2019-05-10 MED ORDER — DOXYCYCLINE HYCLATE 100 MG PO TABS: 100.0000 mg | ORAL_TABLET | Freq: Two times a day (BID) | ORAL | 0 refills | Status: DC

## 2019-05-10 NOTE — Progress Notes (Signed)
Virtual Visit via Telephone Note  I connected with Kirk [name] on 05/10/19 at  1:20 PM EST by telephone and verified that I am speaking with the correct person using two identifiers.   I discussed the limitations, risks, security and privacy concerns of performing an evaluation and management service by telephone and the availability of in person appointments. I also discussed with the patient that there may be a patient responsible charge related to this service. The patient expressed understanding and agreed to proceed.  History of Present Illness:  Past Medical History:  Diagnosis Date  . Arthritis    "right knee" (09/04/2015)  . Atrial flutter with rapid ventricular response ([place]) 04/19/2019  . HTN (hypertension)   . Hyperlipidemia   . NSTEMI (non-ST elevated myocardial infarction) ([place]) 09/04/2015   a. cath 09/05/2015: 95% stenosis mid-LAD, 55% mid-RCA, and 40% prox-RCA. PCI performed w/ Synergy DES to LAD  . PAD (peripheral artery disease) (HCC)    Stenting of bilateral iliacs in 2003.  [name] Refusal of blood transfusions as patient is Jehovah's Witness   . Type II diabetes mellitus (H[facility])     Family History  Problem Relation Age of Onset  . Lymphoma Mother   . Cancer - Prostate Father   . CAD Neg Hx     Social History   Tobacco Use  . Smoking status: Former Smoker    Packs/day: 2.00    Years: 35.00    Pack years: 70.00    Types: Cigarettes    Quit date: 02/19/2003    Years since quitting: 16.2  . Smokeless tobacco: Never Used  Substance Use Topics  . Alcohol use: Yes    Alcohol/week: 0.0 standard drinks    Comment: 09/04/2015 "nothing since 1980s"  . 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 acetaminophen (TYLENOL) 500 MG tablet Take 500 mg by mouth every 6 (six) hours as needed for fever.    [name] apixaban (ELIQUIS) 5 MG  TABS tablet Take 1 tablet (5 mg total) by mouth 2 (two) times daily. 60 tablet 1  . aspirin EC 81 MG tablet Take 81 mg by mouth at bedtime.    . carvedilol (COREG) 6.25 MG tablet TAKE 1 TABLET BY MOUTH TWICE DAILY AFTER MEALS (Patient taking differently: Take 6.25 mg by mouth 2 (two) times daily with a meal. ) 180 tablet 2  . cetirizine (ZYRTEC) 10 MG tablet Take 1 tablet (10 mg total) by mouth daily. (Patient taking differently: Take 10 mg by mouth daily as needed for allergies or rhinitis. ) 30 tablet 11  . diltiazem (CARDIZEM CD) 120 MG 24 hr capsule Take 1 capsule (120 mg total) by mouth daily. 90 capsule 1  . glimepiride (AMARYL) 4 MG tablet Take 1 tablet (4 mg total) by mouth daily. (Patient taking differently: Take 4 mg by mouth at bedtime. ) 90 tablet 1  . ipratropium (ATROVENT) 0.03 % nasal spray USE 2 SPRAYS IN EACH NOSTRIL TWICE DAILY (Patient taking differently: Place 2 sprays into both nostrils 2 (two) times daily as needed for rhinitis. ) 30 mL 2  . lisinopril (ZESTRIL) 10 MG tablet Take 1 tablet (10 mg total) by mouth daily. (Patient taking differently: Take 10 mg by mouth at bedtime. ) 90 tablet 1  . metFORMIN (GLUCOPHAGE) 1000 MG tablet Take 1 tablet (  1,000 mg total) by mouth 2 (two) times daily with a meal. 180 tablet 1  . multivitamin (ONE-A-DAY MEN'S) TABS tablet Take 1 tablet by mouth daily. (Patient not taking: Reported on 04/19/2019) 30 tablet 6  . nitroGLYCERIN (NITROSTAT) 0.4 MG SL tablet Place 1 tablet (0.4 mg total) under the tongue every 5 (five) minutes x 3 doses as needed for chest pain. 25 tablet 2  . rosuvastatin (CRESTOR) 40 MG tablet TAKE 1 TABLET(40 MG) BY MOUTH DAILY (Patient taking differently: Take 40 mg by mouth at bedtime. ) 90 tablet 2   No current facility-administered medications on file prior to visit.    Current Status: Since he last office visit, he has had a hospital visit for Paroxysmal Atrial Flutter on 11/23/202. Today, he is doing well with no  complaints. He has initial appointment with Cardiologist. He has follow up appointment for swallowing problems. He has c/o skin rash X 2-3 weeks. He has used lotions on rash with no relief. He continues to have moderate cough and congestion also, especially at night, with mild shortness of breath. He denies fevers, chills, fatigue, recent infections, weight loss, and night sweats. He has not had any headaches, visual changes, dizziness, and falls. No chest pain, and heart palpitations reported. No reports of GI problems such as nausea, vomiting, diarrhea, and constipation. He has no reports of blood in stools, dysuria and hematuria. No depression or anxiety reported today. He denies suicidal ideations, homicidal ideations, or auditory hallucinations. He denies pain today.   Observations/Objective:  Telephone Virtual Visit   Assessment and Plan:  1. Diabetes mellitus with peripheral vascular disease ([place]) He will continue medication as prescribed, to decrease foods/beverages high in sugars and carbs and follow Heart Healthy or DASH diet. Increase physical activity to at least 30 minutes cardio exercise daily.   2. Hemoglobin A1C between 7% and 9% indicating borderline diabetic control Hgb A1c at 8.2 today. Monitor.   3. Cough - doxycycline (VIBRA-TABS) 100 MG tablet; Take 1 tablet (100 mg total) by mouth 2 (two) times daily.  Dispense: 20 tablet; Refill: 0 - HYDROcodone-homatropine (HYCODAN) 5-1.5 MG/5ML syrup; Take 5 mLs by mouth every 8 (eight) hours as needed for cough.  Dispense: 120 mL; Refill: 0  4. Chest congestion - doxycycline (VIBRA-TABS) 100 MG tablet; Take 1 tablet (100 mg total) by mouth 2 (two) times daily.  Dispense: 20 tablet; Refill: 0 - HYDROcodone-homatropine (HYCODAN) 5-1.5 MG/5ML syrup; Take 5 mLs by mouth every 8 (eight) hours as needed for cough.  Dispense: 120 mL; Refill: 0  5. COVID-19 virus infection We will initiate antibiotic today. - doxycycline (VIBRA-TABS) 100  MG tablet; Take 1 tablet (100 mg total) by mouth 2 (two) times daily.  Dispense: 20 tablet; Refill: 0 - HYDROcodone-homatropine (HYCODAN) 5-1.5 MG/5ML syrup; Take 5 mLs by mouth every 8 (eight) hours as needed for cough.  Dispense: 120 mL; Refill: 0   Follow Up Instructions: He will follow up in 3 months.     I discussed the assessment and treatment plan with the patient. The patient was provided an opportunity to ask questions and all were answered. The patient agreed with the plan and demonstrated an understanding of the instructions.   The patient was advised to call back or seek an in-person evaluation if the symptoms worsen or if the condition fails to improve as anticipated.  I provided 20 minutes of non-face-to-face time during this encounter.   [name], FNP

## 2019-05-11 ENCOUNTER — Other Ambulatory Visit: Payer: Self-pay | Admitting: Cardiovascular Disease

## 2019-05-11 ENCOUNTER — Ambulatory Visit: Payer: Medicare Other | Admitting: Gastroenterology

## 2019-05-12 ENCOUNTER — Ambulatory Visit: Payer: Medicare Other | Admitting: Nurse Practitioner

## 2019-05-12 ENCOUNTER — Ambulatory Visit: Payer: Medicare Other | Admitting: Gastroenterology

## 2019-05-13 ENCOUNTER — Ambulatory Visit (INDEPENDENT_AMBULATORY_CARE_PROVIDER_SITE_OTHER): Payer: Medicare Other | Admitting: Gastroenterology

## 2019-05-13 ENCOUNTER — Telehealth: Payer: Self-pay | Admitting: Emergency Medicine

## 2019-05-13 VITALS — BP 152/60 | HR 72 | Temp 97.1°F | Ht 75.0 in | Wt 224.0 lb

## 2019-05-13 DIAGNOSIS — Z1211 Encounter for screening for malignant neoplasm of colon: Secondary | ICD-10-CM

## 2019-05-13 DIAGNOSIS — R1310 Dysphagia, unspecified: Secondary | ICD-10-CM | POA: Diagnosis not present

## 2019-05-13 DIAGNOSIS — I2510 Atherosclerotic heart disease of native coronary artery without angina pectoris: Secondary | ICD-10-CM

## 2019-05-13 DIAGNOSIS — Z7901 Long term (current) use of anticoagulants: Secondary | ICD-10-CM | POA: Diagnosis not present

## 2019-05-13 MED ORDER — PANTOPRAZOLE SODIUM 40 MG PO TBEC: 40.0000 mg | DELAYED_RELEASE_TABLET | Freq: Every day | ORAL | 1 refills | Status: DC

## 2019-05-13 MED ORDER — SUCRALFATE 1 GM/10ML PO SUSP: 1.0000 g | Freq: Four times a day (QID) | ORAL | 1 refills | Status: DC

## 2019-05-13 NOTE — Patient Instructions (Signed)
You have been scheduled for an endoscopy and colonoscopy. Please follow the written instructions given to you at your visit today. Please pick up your prep supplies at the pharmacy within the next 1-3 days. If you use inhalers (even only as needed), please bring them with you on the day of your procedure.  We have sent the following medications to your pharmacy for you to pick up at your convenience: Carafate 10 cc every 6 hours   Protonix 40 mg 30 minutes before breakfast.   You should be on a soft diet.   Soft-Food Eating Plan A soft-food eating plan includes foods that are safe and easy to chew and swallow. Your health care provider or dietitian can help you find foods and flavors that fit into this plan. Follow this plan until your health care provider or dietitian says it is safe to start eating other foods and food textures. What are tips for following this plan? General guidelines   Take small bites of food, or cut food into pieces about  inch or smaller. Bite-sized pieces of food are easier to chew and swallow.  Eat moist foods. Avoid overly dry foods.  Avoid foods that: ? Are difficult to swallow, such as dry, chunky, crispy, or sticky foods. ? Are difficult to chew, such as hard, tough, or stringy foods. ? Contain nuts, seeds, or fruits.  Follow instructions from your dietitian about the types of liquids that are safe for you to swallow. You may be allowed to have: ? Thick liquids only. This includes only liquids that are thicker than honey. ? Thin and thick liquids. This includes all beverages and foods that become liquid at room temperature.  To make thick liquids: ? Purchase a commercial liquid thickening powder. These are available at grocery stores and pharmacies. ? Mix the thickener into liquids according to instructions on the label. ? Purchase ready-made thickened liquids. ? Thicken soup by pureeing, straining to remove chunks, and adding flour, potato flakes, or  corn starch. ? Add commercial thickener to foods that become liquid at room temperature, such as milk shakes, yogurt, ice cream, gelatin, and sherbet.  Ask your health care provider whether you need to take a fiber supplement. Cooking  Cook meats so they stay tender and moist. Use methods like braising, stewing, or baking in liquid.  Cook vegetables and fruit until they are soft enough to be mashed with a fork.  Peel soft, fresh fruits such as peaches, nectarines, and melons.  When making soup, make sure chunks of meat and vegetables are smaller than  inch.  Reheat leftover foods slowly so that a tough crust does not form. What foods are allowed? The items listed below may not be a complete list. Talk with your dietitian about what dietary choices are best for you. Grains Breads, muffins, pancakes, or waffles moistened with syrup, jelly, or butter. Dry cereals well-moistened with milk. Moist, cooked cereals. Well-cooked pasta and rice. Vegetables All soft-cooked vegetables. Shredded lettuce. Fruits All canned and cooked fruits. Soft, peeled fresh fruits. Strawberries. Dairy Milk. Cream. Yogurt. Cottage cheese. Soft cheese without the rind. Meats and other protein foods Tender, moist ground meat, poultry, or fish. Meat cooked in gravy or sauces. Eggs. Sweets and desserts Ice cream. Milk shakes. Sherbet. Pudding. Fats and oils Butter. Margarine. Olive, canola, sunflower, and grapeseed oil. Smooth salad dressing. Smooth cream cheese. Mayonnaise. Gravy. What foods are not allowed? The items listed bemay not be a complete list. Talk with your dietitian about what  dietary choices are best for you. Grains Coarse or dry cereals, such as bran, granola, and shredded wheat. Tough or chewy crusty breads, such as [place] bread or baguettes. Breads with nuts, seeds, or fruit. Vegetables All raw vegetables. Cooked corn. Cooked vegetables that are tough or stringy. Tough, crisp, fried potatoes  and potato skins. Fruits Fresh fruits with skins or seeds, or both, such as apples, pears, and grapes. Stringy, high-pulp fruits, such as papaya, pineapple, coconut, and mango. Fruit leather and all dried fruit. Dairy Yogurt with nuts or coconut. Meats and other protein foods Hard, dry sausages. Dry meat, poultry, or fish. Meats with gristle. Fish with bones. Fried meat or fish. Lunch meat and hotdogs. Nuts and seeds. Chunky peanut butter or other nut butters. Sweets and desserts Cakes or cookies that are very dry or chewy. Desserts with dried fruit, nuts, or coconut. Fried pastries. Very rich pastries. Fats and oils Cream cheese with fruit or nuts. Salad dressings with seeds or chunks. Summary  A soft-food eating plan includes foods that are safe and easy to swallow. Generally, the foods should be soft enough to be mashed with a fork.  Avoid foods that are dry, hard to chew, crunchy, sticky, stringy, or crispy.  Ask your health care provider whether you need to thicken your liquids and if you need to take a fiber supplement. This information is not intended to replace advice given to you by your health care provider. Make sure you discuss any questions you have with your health care provider. Document Released: 08/20/2007 Document Revised: 09/03/2018 Document Reviewed: 07/16/2016 Elsevier Patient Education  2020 [employer].

## 2019-05-13 NOTE — Telephone Encounter (Signed)
Patient with diagnosis of afib on Eliquis for anticoagulation.    Procedure: Endoscopy/colonoscopy Date of procedure: 05/17/2019  CHADS2-VASc score of  4 (HTN, AGE, DM2, CAD)  CrCl 94 ml/min  Per office protocol, patient can hold Eliquis for 2 days prior to procedure.

## 2019-05-13 NOTE — Telephone Encounter (Signed)
[facility] Pre-operative Risk Assessment     Request for surgical clearance:     Endoscopy Procedure  What type of surgery is being performed?     Endoscopy/colonoscopy  When is this surgery scheduled?     05-17-2019  What type of clearance is required ?   Pharmacy  Are there any medications that need to be held prior to surgery and how long? Eliquis 2 days  Practice name and name of physician performing surgery?      [facility]  What is your office phone and fax number?      Phone- [phone]  [phone]  Anesthesia type (None, local, MAC, general) ?       MAC

## 2019-05-13 NOTE — Progress Notes (Signed)
HPI :  63 y/o male with a history of CAD / NSTEMI, atrial flutter on Eliquis, HTN, HLD, PAD, referred by Reino Bellis NP for dysphagia and odynophagia.   He was admitted to the hospital at the end of November for chest pain, had negative troponins, did not think ACS but he was noted to be in atrial flutter. He was on Plavix at the time of admission. He had a workup with Cardiology to include an echocardiogram 04/20/19 which showed an EF 60-65%, overall no change since 2017. He was placed on Eliquis and rate controlled, taking dilitiazem, and discharged. He has had COVID in early November which he recovered from but has some baseline mild dyspnea since that time.   He states about a month ago he started having intermittent dysphagia to solids. Over time this has worsened. Now happening fairly frequently to his solids and also with pills. He feels them get "stuck" in his lower esophagus and then eventually passes, but now having discomfort when he swallows and contents go through that area. He has had some reflux symptoms historically, using Mylanta as needed which helps at times.  He denies any nausea or vomiting.  He denies any postprandial abdominal pains.  He normally has regular bowels, although for the past 2 weeks has been a bit more constipated.  Using some over-the-counter laxatives as needed which is working well.  No blood in the stools.  He denies any weight loss.  He denies any family history of colon cancer or esophageal cancer.  He has never had a prior upper endoscopy.  He states he had a colonoscopy more than 10 years ago at [facility], thought it was normal and thinks he is due for another colonoscopy.  He has normal creatinine.     Past Medical History:  Diagnosis Date  . Arthritis    "right knee" (09/04/2015)  . Atrial flutter with rapid ventricular response ([place]) 04/19/2019  . HTN (hypertension)   . Hyperlipidemia   . NSTEMI (non-ST elevated myocardial infarction) ([place])  09/04/2015   a. cath 09/05/2015: 95% stenosis mid-LAD, 55% mid-RCA, and 40% prox-RCA. PCI performed w/ Synergy DES to LAD  . PAD (peripheral artery disease) ([facility])    Stenting of bilateral iliacs in 2003.  [name] Refusal of blood transfusions as patient is Jehovah's Witness   . Type II diabetes mellitus ([place])      Past Surgical History:  Procedure Laterality Date  . CARDIAC CATHETERIZATION N/A 09/05/2015   Procedure: Left Heart Cath and Coronary Angiography;  Surgeon: Troy Sine, MD;  Location: [facility];  Service: Cardiovascular;  Laterality: N/A;  . CARDIAC CATHETERIZATION N/A 09/05/2015   Procedure: Coronary Stent Intervention;  Surgeon: Troy Sine, MD;  Location: [facility];  Service: Cardiovascular;  Laterality: N/A;  . 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 PERIPHERAL VASCULAR CATHETERIZATION Bilateral 2003   "stenting"  . TONSILLECTOMY  ~ 1963   Family History  Problem Relation Age of Onset  . Lymphoma Mother   . Cancer - Prostate Father   . CAD Neg Hx    Social History   Tobacco Use  . Smoking status: Former Smoker    Packs/day: 2.00    Years: 35.00    Pack years: 70.00    Types: Cigarettes    Quit date: 02/19/2003    Years since quitting: 16.2  . Smokeless tobacco: Never Used  Substance Use Topics  .  Alcohol use: Yes    Alcohol/week: 0.0 standard drinks    Comment: 09/04/2015 "nothing since 1980s"  . 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 acetaminophen (TYLENOL) 500 MG tablet Take 500 mg by mouth every 6 (six) hours as needed for fever.    [name] apixaban (ELIQUIS) 5 MG TABS tablet Take 1 tablet (5 mg total) by mouth 2 (two) times daily. 60 tablet 1  . aspirin EC 81 MG tablet Take 81 mg by mouth at bedtime.    . carvedilol (COREG) 6.25 MG tablet TAKE 1 TABLET BY MOUTH TWICE DAILY AFTER MEALS (Patient taking differently:  Take 6.25 mg by mouth 2 (two) times daily with a meal. ) 180 tablet 2  . cetirizine (ZYRTEC) 10 MG tablet Take 1 tablet (10 mg total) by mouth daily. (Patient taking differently: Take 10 mg by mouth daily as needed for allergies or rhinitis. ) 30 tablet 11  . diltiazem (CARDIZEM CD) 120 MG 24 hr capsule Take 1 capsule (120 mg total) by mouth daily. 90 capsule 1  . doxycycline (VIBRA-TABS) 100 MG tablet Take 1 tablet (100 mg total) by mouth 2 (two) times daily. 20 tablet 0  . glimepiride (AMARYL) 4 MG tablet Take 1 tablet (4 mg total) by mouth daily. (Patient taking differently: Take 4 mg by mouth at bedtime. ) 90 tablet 1  . HYDROcodone-homatropine (HYCODAN) 5-1.5 MG/5ML syrup Take 5 mLs by mouth every 8 (eight) hours as needed for cough. 120 mL 0  . Hydrocortisone Acetate 1 % CREA Apply 15 g topically 2 (two) times daily. 15 g 3  . ipratropium (ATROVENT) 0.03 % nasal spray USE 2 SPRAYS IN EACH NOSTRIL TWICE DAILY (Patient taking differently: Place 2 sprays into both nostrils 2 (two) times daily as needed for rhinitis. ) 30 mL 2  . lisinopril (ZESTRIL) 10 MG tablet Take 1 tablet (10 mg total) by mouth daily. (Patient taking differently: Take 10 mg by mouth at bedtime. ) 90 tablet 1  . metFORMIN (GLUCOPHAGE) 1000 MG tablet Take 1 tablet (1,000 mg total) by mouth 2 (two) times daily with a meal. 180 tablet 1  . multivitamin (ONE-A-DAY MEN'S) TABS tablet Take 1 tablet by mouth daily. (Patient not taking: Reported on 04/19/2019) 30 tablet 6  . nitroGLYCERIN (NITROSTAT) 0.4 MG SL tablet Place 1 tablet (0.4 mg total) under the tongue every 5 (five) minutes x 3 doses as needed for chest pain. 25 tablet 2  . rosuvastatin (CRESTOR) 40 MG tablet TAKE 1 TABLET BY MOUTH DAILY 90 tablet 0   No current facility-administered medications for this visit.   No Known Allergies   Review of Systems: All systems reviewed and negative except where noted in HPI.    DG Chest Portable 1 View  Result Date:  04/19/2019 CLINICAL DATA:  Chest pain.  Recently tested positive for COVID-19. EXAM: PORTABLE CHEST 1 VIEW COMPARISON:  Chest x-ray dated Oct 01, 2018. FINDINGS: The heart size and mediastinal contours are within normal limits. Both lungs are clear. The visualized skeletal structures are unremarkable. IMPRESSION: No active disease. Electronically Signed   By: Titus Dubin M.D.   On: 04/19/2019 11:30   ECHOCARDIOGRAM COMPLETE  Result Date: 04/20/2019   ECHOCARDIOGRAM REPORT   Patient Name:   Kirk [name] Date of Exam: 04/20/2019 Medical Rec #:  [id number]       Height:  75.0 in Accession #:    [id number]      Weight:       225.0 lb Date of Birth:  [birth year]        BSA:          2.31 m Patient Age:    25 years        BP:           115/61 mmHg Patient Gender: M               HR:           72 bpm. Exam Location:  Inpatient Procedure: 2D Echo Indications:    Chest Pain 786.50 / R07.9  History:        Patient has prior history of Echocardiogram examinations, most                 recent 09/06/2015. NSTEMI, Arrythmias:Atrial Flutter; Risk                 Factors:Hypertension, Dyslipidemia and Diabetes.  Sonographer:    Vikki Ports Turrentine Referring Phys: [place]  1. Left ventricular ejection fraction, by visual estimation, is 60 to 65%. The left ventricle has normal function. There is mildly increased left ventricular hypertrophy.  2. The left ventricle has no regional wall motion abnormalities.  3. Global right ventricle has normal systolic function.The right ventricular size is normal. No increase in right ventricular wall thickness.  4. Left atrial size was mildly dilated.  5. Right atrial size was normal.  6. The mitral valve is normal in structure. No evidence of mitral valve regurgitation. No evidence of mitral stenosis.  7. The tricuspid valve is normal in structure. Tricuspid valve regurgitation is not demonstrated.  8. The aortic valve is normal in structure. Aortic valve  regurgitation is not visualized. No evidence of aortic valve sclerosis or stenosis.  9. The pulmonic valve was normal in structure. Pulmonic valve regurgitation is not visualized. 10. TR signal is inadequate for assessing pulmonary artery systolic pressure. 11. The inferior vena cava is normal in size with greater than 50% respiratory variability, suggesting right atrial pressure of 3 mmHg. In comparison to the previous echocardiogram(s): Prior examinations were reviewed in a side by side comparison of images. No change since 2017. FINDINGS  Left Ventricle: Left ventricular ejection fraction, by visual estimation, is 60 to 65%. The left ventricle has normal function. The left ventricle has no regional wall motion abnormalities. There is mildly increased left ventricular hypertrophy. Concentric left ventricular hypertrophy. Left ventricular diastolic parameters were normal. Normal left atrial pressure. Right Ventricle: The right ventricular size is normal. No increase in right ventricular wall thickness. Global RV systolic function is has normal systolic function. Left Atrium: Left atrial size was mildly dilated. Right Atrium: Right atrial size was normal in size Pericardium: There is no evidence of pericardial effusion. Mitral Valve: The mitral valve is normal in structure. No evidence of mitral valve stenosis by observation. No evidence of mitral valve regurgitation. Tricuspid Valve: The tricuspid valve is normal in structure. Tricuspid valve regurgitation is not demonstrated. Aortic Valve: The aortic valve is normal in structure. Aortic valve regurgitation is not visualized. The aortic valve is structurally normal, with no evidence of sclerosis or stenosis. Pulmonic Valve: The pulmonic valve was normal in structure. Pulmonic valve regurgitation is not visualized. Aorta: The aortic root, ascending aorta and aortic arch are all structurally normal, with no evidence of dilitation or obstruction. Venous: The inferior  vena  cava is normal in size with greater than 50% respiratory variability, suggesting right atrial pressure of 3 mmHg. IAS/Shunts: No atrial level shunt detected by color flow Doppler. No ventricular septal defect is seen or detected. There is no evidence of an atrial septal defect.  LEFT VENTRICLE PLAX 2D LVIDd:         3.40 cm  Diastology LVIDs:         2.30 cm  LV e' lateral:   10.10 cm/s LV PW:         1.30 cm  LV E/e' lateral: 7.9 LV IVS:        1.30 cm  LV e' medial:    8.48 cm/s LVOT diam:     2.20 cm  LV E/e' medial:  9.4 LV SV:         29 ml LV SV Index:   12.53 LVOT Area:     3.80 cm  RIGHT VENTRICLE RV S prime:     10.20 cm/s TAPSE (M-mode): 2.0 cm LEFT ATRIUM             Index       RIGHT ATRIUM           Index LA diam:        3.80 cm 1.65 cm/m  RA Area:     14.70 cm LA Vol (A2C):   58.0 ml 25.14 ml/m RA Volume:   33.70 ml  14.61 ml/m LA Vol (A4C):   54.6 ml 23.67 ml/m LA Biplane Vol: 56.7 ml 24.58 ml/m  AORTIC VALVE LVOT Vmax:   77.80 cm/s LVOT Vmean:  59.700 cm/s LVOT VTI:    0.189 m  AORTA Ao Root diam: 3.20 cm MITRAL VALVE MV Area (PHT): 4.15 cm    SHUNTS MV PHT:        53.07 msec  Systemic VTI:  0.19 m MV Decel Time: 183 msec    Systemic Diam: 2.20 cm MV E velocity: 79.90 cm/s MV A velocity: 62.40 cm/s MV E/A ratio:  1.28  Mihai Croitoru MD Electronically signed by Sanda Klein MD Signature Date/Time: 04/20/2019/10:53:48 AM    Final     Physical Exam: Temp (!) 97.1 F (36.2 C)   Wt 224 lb (101.6 kg)   BMI 28.00 kg/m  Constitutional: Pleasant,well-developed, male in no acute distress. HEENT: Normocephalic and atraumatic. Conjunctivae are normal. No scleral icterus. Neck supple.  Cardiovascular: Normal rate, regular rhythm.  Pulmonary/chest: Effort normal and breath sounds normal. No wheezing, rales or rhonchi. Abdominal: Soft, nondistended, nontender.  There are no masses palpable. No hepatomegaly. Extremities: no edema Lymphadenopathy: No cervical adenopathy  noted. Neurological: Alert and oriented to person place and time. Skin: Skin is warm and dry. No rashes noted. Psychiatric: Normal mood and affect. Behavior is normal.   ASSESSMENT AND PLAN: 63 year old male here for new patient assessment of the following:  Dysphagia / odynophagia - as above, 1 month worth of progressive dysphagia with odynophagia, both new for him.  He does have some historical reflux symptoms but seem mild.  I discussed differential diagnosis with him.  I am recommending an EGD to further evaluate and clarify what is causing this, and if due to benign stricturing disease etc. can offer him dilation to help his symptoms.  I discussed risk benefits of EGD and anesthesia with him and he wanted to proceed.  In order to do this safely if we need to dilate, he will need to hold his Eliquis for 2 days prior to the  procedure.  We will reach out to his cardiologist to see if this is acceptable.  In the interim until we can do the procedure I am recommending he go on a trial of PPI, will start Protonix 40 mg once daily in light of his odynophagia, as well as give him liquid Carafate 10 cc every 6 hours as needed.  Hopefully this will help him feel better.  In interim I asked him to avoid meat and chicken to avoid impaction and stay on a soft diet.  He agreed.  Of note he does not need Covid testing given his recent diagnosis of coronavirus from which he is recovered  Colon cancer screening / Anticoagulated - he is due for routine colon cancer screening, his last exam was over 10 years ago.  He does not have any significant bowel problems, some recent mild constipation.  I offered him colonoscopy to be done at the same time as his endoscopy so we can sedate him once he get both of these done.  He will be off Eliquis for 2 days prior to the exam.  Following discussion of colonoscopy he wanted to to proceed with it.  Further recommendations pending results  Benham Cellar, MD [facility]  CC: Cheryln Manly, NP

## 2019-05-13 NOTE — Telephone Encounter (Signed)
   Primary Cardiologist: Lauree Chandler, MD  Chart reviewed as part of pre-operative protocol coverage. Given past medical history and time since last visit, based on ACC/AHA guidelines, Kirk [name] would be at acceptable risk for the planned procedure without further cardiovascular testing.   Patient with diagnosis of afib on Eliquis for anticoagulation.    Procedure: Endoscopy/colonoscopy Date of procedure: 05/17/2019  CHADS2-VASc score of  4 (HTN, AGE, DM2, CAD)  CrCl 94 ml/min  Per office protocol, patient can hold Eliquis for 2 days prior to procedure.    I will route this recommendation to the requesting party via Epic fax function and remove from pre-op pool.  Please call with questions.  Kathyrn Drown, NP 05/13/2019, 4:58 PM

## 2019-05-14 NOTE — Telephone Encounter (Signed)
Spoke with patient and he verbalized understanding he will hold his eliquis this weekend in order to prepare for his procedure on Monday. He will resume as instructed at discharge.

## 2019-05-17 ENCOUNTER — Other Ambulatory Visit: Payer: Self-pay

## 2019-05-17 ENCOUNTER — Ambulatory Visit (AMBULATORY_SURGERY_CENTER): Payer: No Typology Code available for payment source | Admitting: Gastroenterology

## 2019-05-17 ENCOUNTER — Encounter: Payer: Self-pay | Admitting: Gastroenterology

## 2019-05-17 VITALS — BP 106/74 | HR 75 | Temp 98.0°F | Resp 14 | Ht 75.0 in | Wt 224.0 lb

## 2019-05-17 DIAGNOSIS — R1310 Dysphagia, unspecified: Secondary | ICD-10-CM

## 2019-05-17 DIAGNOSIS — D123 Benign neoplasm of transverse colon: Secondary | ICD-10-CM

## 2019-05-17 DIAGNOSIS — K31811 Angiodysplasia of stomach and duodenum with bleeding: Secondary | ICD-10-CM | POA: Diagnosis not present

## 2019-05-17 DIAGNOSIS — Z1211 Encounter for screening for malignant neoplasm of colon: Secondary | ICD-10-CM

## 2019-05-17 MED ORDER — SODIUM CHLORIDE 0.9 % IV SOLN: 500.0000 mL | Freq: Once | INTRAVENOUS | Status: DC

## 2019-05-17 NOTE — Op Note (Addendum)
[facility] Patient Name: Kirk [name] Procedure Date: 05/17/2019 7:59 AM MRN: [id number] Endoscopist: Viviann Spare P. Adela Lank , MD Age: 63 Referring MD:  Date of Birth: Jul 02, 1955 Gender: Male Account #: [id number] Procedure:                Colonoscopy Indications:              Screening for colorectal malignant neoplasm Medicines:                Monitored Anesthesia Care Procedure:                Pre-Anesthesia Assessment:                           - Prior to the procedure, a History and Physical                            was performed, and patient medications and                            allergies were reviewed. The patient's tolerance of                            previous anesthesia was also reviewed. The risks                            and benefits of the procedure and the sedation                            options and risks were discussed with the patient.                            All questions were answered, and informed consent                            was obtained. Prior Anticoagulants: The patient has                            taken Eliquis (apixaban), last dose was 2 days                            prior to procedure. ASA Grade Assessment: III - A                            patient with severe systemic disease. After                            reviewing the risks and benefits, the patient was                            deemed in satisfactory condition to undergo the                            procedure.  After obtaining informed consent, the colonoscope                            was passed under direct vision. Throughout the                            procedure, the patient's blood pressure, pulse, and                            oxygen saturations were monitored continuously. The                            Colonoscope was introduced through the anus and                            advanced to the the cecum, identified by                     appendiceal orifice and ileocecal valve. The                            colonoscopy was performed without difficulty. The                            patient tolerated the procedure well. The quality                            of the bowel preparation was good. The ileocecal                            valve, appendiceal orifice, and rectum were                            photographed. Scope In: 8:19:30 AM Scope Out: 8:41:58 AM Scope Withdrawal Time: 0 hours 16 minutes 33 seconds  Total Procedure Duration: 0 hours 22 minutes 28 seconds  Findings:                 The perianal and digital rectal examinations were                            normal.                           Three sessile polyps were found in the transverse                            colon. The polyps were 3 to 4 mm in size. These                            polyps were removed with a cold snare. Resection                            and retrieval were complete.  Internal hemorrhoids were found during                            retroflexion. The hemorrhoids were moderate.                           The exam was otherwise without abnormality. Complications:            No immediate complications. Estimated blood loss:                            Minimal. Estimated Blood Loss:     Estimated blood loss was minimal. Impression:               - Three 3 to 4 mm polyps in the transverse colon,                            removed with a cold snare. Resected and retrieved.                           - Internal hemorrhoids.                           - The examination was otherwise normal. Recommendation:           - Patient has a contact number available for                            emergencies. The signs and symptoms of potential                            delayed complications were discussed with the                            patient. Return to normal activities tomorrow.                             Written discharge instructions were provided to the                            patient.                           - Resume previous diet.                           - Continue present medications.                           - Resume Eliquis tomorrow                           - Await pathology results. Viviann Spare P. Shelva Hetzer, MD 05/17/2019 8:46:34 AM This report has been signed electronically.

## 2019-05-17 NOTE — Patient Instructions (Signed)
Information on hemorrhoids and polyps given to you today.  Await pathology results.  Resume Eliquis tomorrow.  Recommend CT of chest with contrast to further evaluate.  YOU HAD AN ENDOSCOPIC PROCEDURE TODAY AT [facility]:   Refer to the procedure report that was given to you for any specific questions about what was found during the examination.  If the procedure report does not answer your questions, please call your gastroenterologist to clarify.  If you requested that your care partner not be given the details of your procedure findings, then the procedure report has been included in a sealed envelope for you to review at your convenience later.  YOU SHOULD EXPECT: Some feelings of bloating in the abdomen. Passage of more gas than usual.  Walking can help get rid of the air that was put into your GI tract during the procedure and reduce the bloating. If you had a lower endoscopy (such as a colonoscopy or flexible sigmoidoscopy) you may notice spotting of blood in your stool or on the toilet paper. If you underwent a bowel prep for your procedure, you may not have a normal bowel movement for a few days.  Please Note:  You might notice some irritation and congestion in your nose or some drainage.  This is from the oxygen used during your procedure.  There is no need for concern and it should clear up in a day or so.  SYMPTOMS TO REPORT IMMEDIATELY:   Following lower endoscopy (colonoscopy or flexible sigmoidoscopy):  Excessive amounts of blood in the stool  Significant tenderness or worsening of abdominal pains  Swelling of the abdomen that is new, acute  Fever of 100F or higher   Following upper endoscopy (EGD)  Vomiting of blood or coffee ground material  New chest pain or pain under the shoulder blades  Painful or persistently difficult swallowing  New shortness of breath  Fever of 100F or higher  Black, tarry-looking stools  For urgent or emergent issues, a  gastroenterologist can be reached at any hour by calling [phone].   DIET:  We do recommend a small meal at first, but then you may proceed to your regular diet.  Drink plenty of fluids but you should avoid alcoholic beverages for 24 hours.  ACTIVITY:  You should plan to take it easy for the rest of today and you should NOT DRIVE or use heavy machinery until tomorrow (because of the sedation medicines used during the test).    FOLLOW UP: Our staff will call the number listed on your records 48-72 hours following your procedure to check on you and address any questions or concerns that you may have regarding the information given to you following your procedure. If we do not reach you, we will leave a message.  We will attempt to reach you two times.  During this call, we will ask if you have developed any symptoms of COVID 19. If you develop any symptoms (ie: fever, flu-like symptoms, shortness of breath, cough etc.) before then, please call [phone].  If you test positive for Covid 19 in the 2 weeks post procedure, please call and report this information to [place].    If any biopsies were taken you will be contacted by phone or by letter within the next 1-3 weeks.  Please call us at [phone] if you have not heard about the biopsies in 3 weeks.    SIGNATURES/CONFIDENTIALITY: You and/or your care partner have signed paperwork which will be  entered into your electronic medical record.  These signatures attest to the fact that that the information above on your After Visit Summary has been reviewed and is understood.  Full responsibility of the confidentiality of this discharge information lies with you and/or your care-partner.

## 2019-05-17 NOTE — Progress Notes (Signed)
Called to room to assist during endoscopic procedure.  Patient ID and intended procedure confirmed with present staff. Received instructions for my participation in the procedure from the performing physician.  

## 2019-05-17 NOTE — Op Note (Signed)
[facility] Patient Name: Kirk [name] Procedure Date: 05/17/2019 7:59 AM MRN: [id number] Endoscopist: Viviann Spare P. Adela Lank , MD Age: 63 Referring MD:  Date of Birth: [birth year] Gender: Male Account #: [id number] Procedure:                Upper GI endoscopy Indications:              Dysphagia, Odynophagia Medicines:                Monitored Anesthesia Care Procedure:                Pre-Anesthesia Assessment:                           - Prior to the procedure, a History and Physical                            was performed, and patient medications and                            allergies were reviewed. The patient's tolerance of                            previous anesthesia was also reviewed. The risks                            and benefits of the procedure and the sedation                            options and risks were discussed with the patient.                            All questions were answered, and informed consent                            was obtained. Prior Anticoagulants: The patient has                            taken Eliquis (apixaban), last dose was 2 days                            prior to procedure. ASA Grade Assessment: III - A                            patient with severe systemic disease. After                            reviewing the risks and benefits, the patient was                            deemed in satisfactory condition to undergo the                            procedure.  After obtaining informed consent, the endoscope was                            passed under direct vision. Throughout the                            procedure, the patient's blood pressure, pulse, and                            oxygen saturations were monitored continuously. The                            Endoscope was introduced through the mouth, and                            advanced to the second part of duodenum. The upper               GI endoscopy was accomplished without difficulty.                            The patient tolerated the procedure well. Scope In: Scope Out: Findings:                 Esophagogastric landmarks were identified: the                            Z-line was found at 45 cm, the gastroesophageal                            junction was found at 45 cm and the upper extent of                            the gastric folds was found at 45 cm from the                            incisors.                           An area of extrinsic compression was found in the                            mid to lower esophagus from 30cm to 35cm from the                            incisors, narrowing the esophageal lumen. The scope                            was able traverse the area. The overlying mucosa                            appeared normal, biopsies not obtained.                           The exam of the esophagus was otherwise  normal.                           The entire examined stomach was normal.                           A single medium angiodysplastic lesion was found in                            the second portion of the duodenum.                           The exam of the duodenum was otherwise normal. Complications:            No immediate complications. Estimated blood loss:                            Minimal. Estimated Blood Loss:     Estimated blood loss was minimal. Impression:               - Esophagogastric landmarks identified.                           - Extrinsic compression in the of the esophagus,                            roughly 5cm in length, which is the likely cause of                            the patient's symptoms. Cross sectional imaging                            with CT +/- EUS is warranted to further evaluate.                           - Normal stomach.                           - A single angiodysplastic lesion in the duodenum. Recommendation:           - Patient  has a contact number available for                            emergencies. The signs and symptoms of potential                            delayed complications were discussed with the                            patient. Return to normal activities tomorrow.                            Written discharge instructions were provided to the                            patient.                           -  Resume previous diet.                           - Continue present medications.                           - Resume Eliquis tomorrow per colonoscopy note                           - Recommend CT scan of the chest with contrast to                            further evaluate Kirk [name] P. Amiliana Foutz, MD 05/17/2019 8:55:05 AM This report has been signed electronically.

## 2019-05-17 NOTE — Progress Notes (Signed)
A and O x3. Report to RN. Tolerated MAC anesthesia well.Teeth unchanged after procedure.

## 2019-05-18 ENCOUNTER — Telehealth: Payer: Self-pay

## 2019-05-18 ENCOUNTER — Other Ambulatory Visit: Payer: Self-pay

## 2019-05-18 DIAGNOSIS — R1310 Dysphagia, unspecified: Secondary | ICD-10-CM

## 2019-05-18 DIAGNOSIS — R933 Abnormal findings on diagnostic imaging of other parts of digestive tract: Secondary | ICD-10-CM

## 2019-05-18 DIAGNOSIS — K222 Esophageal obstruction: Secondary | ICD-10-CM

## 2019-05-18 NOTE — Telephone Encounter (Signed)
Left message to please call back. °

## 2019-05-19 ENCOUNTER — Telehealth: Payer: Self-pay

## 2019-05-19 NOTE — Telephone Encounter (Signed)
  Follow up Call-  Call back number 05/17/2019  Post procedure Call Back phone  # [phone]  Permission to leave phone message Yes  Some recent data might be hidden     Patient questions:  Do you have a fever, pain , or abdominal swelling? No. Pain Score  0 *  Have you tolerated food without any problems? Yes.    Have you been able to return to your normal activities? Yes.    Do you have any questions about your discharge instructions: Diet   No. Medications  No. Follow up visit  No.  Do you have questions or concerns about your Care? No.  Actions: * If pain score is 4 or above: No action needed, pain <4.  1. Have you developed a fever since your procedure? no  2.   Have you had an respiratory symptoms (SOB or cough) since your procedure? no  3.   Have you tested positive for COVID 19 since your procedure no  4.   Have you had any family members/close contacts diagnosed with the COVID 19 since your procedure?  no   If yes to any of these questions please route to Joylene John, RN and Alphonsa Gin, Therapist, sports.

## 2019-05-24 ENCOUNTER — Telehealth: Payer: Self-pay | Admitting: Cardiovascular Disease

## 2019-05-24 NOTE — Telephone Encounter (Signed)
Can we let him know that the Eliquis is a blood thinner and was started last month to lower his risk of stroke since he had atrial flutter when he was in the ED. Plavix was used after his coronary stent but is not a blood thinner. If he cannot afford Eliquis, we can consider switching to coumadin. If he would like to do this, we can make an appt in the coumadin clinic or we can discuss this with him at his appt on 06/09/19 with Ermalinda Barrios PA in our office. Thanks, chris

## 2019-05-24 NOTE — Telephone Encounter (Signed)
I spoke with patient and explained why he had been switched to the Eliquis. Patient verbalized understanding. I gave him Dr. Camillia Herter recommendation of starting coumadin. Patient was very unsure about this and decided that he will discuss it further with Estella Husk at his appt in January.

## 2019-05-24 NOTE — Telephone Encounter (Signed)
New Message  Pt c/o medication issue:  1. Name of Medication: apixaban (ELIQUIS) 5 MG TABS tablet   2. How are you currently taking this medication (dosage and times per day)? Take 1 tablet (5 mg total) by mouth 2 (two) times daily.  3. Are you having a reaction (difficulty breathing--STAT)? No  4. What is your medication issue? Patient states that he can not afford to pay $465 for Eliquis and would like to be put back on Plavix. Patient states that he felt better when he was taking Plavix. Please give patient a call back to discuss.

## 2019-05-24 NOTE — Telephone Encounter (Signed)
Patient states that he was switched from Plavix to Eliquis after he was hospitalized about a month ago. Patient would like to be switched back to Plavix because he can not afford Eliquis and he also has not felt as good since switching. He states that he has been constipated and has had some SOB. He states that Brilinta caused these symptoms as well so he would prefer just to go back to Plavix as he felt fine when he was on it. Advised patient that I will check with Dr. Angelena Form and we will let him know.

## 2019-05-26 ENCOUNTER — Other Ambulatory Visit: Payer: Self-pay

## 2019-05-26 ENCOUNTER — Ambulatory Visit (HOSPITAL_COMMUNITY)
Admission: RE | Admit: 2019-05-26 | Discharge: 2019-05-26 | Disposition: A | Discharge status: Home / Self Care | Payer: Medicare Other | Source: Ambulatory Visit | Attending: Gastroenterology | Admitting: Gastroenterology

## 2019-05-26 DIAGNOSIS — K222 Esophageal obstruction: Secondary | ICD-10-CM | POA: Insufficient documentation

## 2019-05-26 MED ORDER — SODIUM CHLORIDE (PF) 0.9 % IJ SOLN
INTRAMUSCULAR | Status: AC
  Filled 2019-05-26: qty 50

## 2019-05-26 MED ORDER — IOHEXOL 300 MG/ML  SOLN
75.0000 mL | Freq: Once | INTRAMUSCULAR | Status: AC | PRN
  Administered 2019-05-26: 75 mL via INTRAVENOUS

## 2019-05-27 ENCOUNTER — Other Ambulatory Visit: Payer: Self-pay

## 2019-05-27 ENCOUNTER — Telehealth: Payer: Self-pay | Admitting: *Deleted

## 2019-05-27 ENCOUNTER — Telehealth: Payer: Self-pay

## 2019-05-27 DIAGNOSIS — J984 Other disorders of lung: Secondary | ICD-10-CM

## 2019-05-27 DIAGNOSIS — R918 Other nonspecific abnormal finding of lung field: Secondary | ICD-10-CM

## 2019-05-27 NOTE — Telephone Encounter (Signed)
-----   Message from Yetta Flock, MD sent at 05/26/2019  6:32 PM EST ----- Called patient with results of CT Scan, very concerning findings for metastatic malignancy, perhaps primary lung cancer. I think he is having extrinsic compression of his esophagus from pulmonary lesions. I discussed results with him, my concern for metastatic disease. I am going to order a PET scan, have his case reviewed at the cancer conference, and refer him to Oncology. He will warrant a biopsy but will touch base with oncology first about findings, likely will await PET first.    Sherlynn Stalls, can you please coordinate PET scan for this patient to be done ASAP. Can you also refer him to Oncology. Will touch base with their cancer conference coordinator to get his case presented. Can you also order him a CBC and CMET to make sure stable labs. Thanks

## 2019-05-27 NOTE — Telephone Encounter (Signed)
Scheduled patient for urgent PET scan at [facility] on 06/07/19 (this was the first available, even with ASAP status). Patient to arrive at 6:45am and be NPO after midnight. No carbs the day before. Order in [place] for CBC and CMET. Oncology referral made. Called patient to give the above information and got voice mail. Left message to please call back

## 2019-05-27 NOTE — Progress Notes (Signed)
Pet

## 2019-05-27 NOTE — Telephone Encounter (Signed)
Oncology Nurse Navigator Documentation  Oncology Nurse Navigator Flowsheets 05/27/2019  Navigator Location [facility]  Referral Date to RadOnc/MedOnc 05/27/2019  Navigator Encounter Type Telephone/I received referral on Kirk [name] today.  Kirk [name] is update on referral. I called patient and scheduled him to be seen with Kirk [name] on 06/03/19.    Telephone Outgoing Call  Treatment Phase Abnormal Scans  Barriers/Navigation Needs Coordination of Care;Education  Education Other  Interventions Coordination of Care;Education  Acuity Level 2-Minimal Needs (1-2 Barriers Identified)  Coordination of Care Appts  Education Method Verbal  Time Spent with Patient 30

## 2019-05-28 ENCOUNTER — Other Ambulatory Visit: Payer: Self-pay | Admitting: Cardiovascular Disease

## 2019-05-28 DIAGNOSIS — C3490 Malignant neoplasm of unspecified part of unspecified bronchus or lung: Secondary | ICD-10-CM

## 2019-05-28 HISTORY — DX: Malignant neoplasm of unspecified part of unspecified bronchus or lung: C34.90

## 2019-05-31 ENCOUNTER — Other Ambulatory Visit (INDEPENDENT_AMBULATORY_CARE_PROVIDER_SITE_OTHER): Payer: Medicare Other

## 2019-05-31 ENCOUNTER — Telehealth: Payer: Self-pay

## 2019-05-31 DIAGNOSIS — J984 Other disorders of lung: Secondary | ICD-10-CM | POA: Diagnosis not present

## 2019-05-31 LAB — CBC WITH DIFFERENTIAL/PLATELET
Basophils Absolute: 0.1 10*3/uL (ref 0.0–0.1)
Basophils Relative: 1.1 % (ref 0.0–3.0)
Eosinophils Absolute: 0.5 10*3/uL (ref 0.0–0.7)
Eosinophils Relative: 6.6 % — ABNORMAL HIGH (ref 0.0–5.0)
HCT: 38.7 % — ABNORMAL LOW (ref 39.0–52.0)
Hemoglobin: 13 g/dL (ref 13.0–17.0)
Lymphocytes Relative: 42.1 % (ref 12.0–46.0)
Lymphs Abs: 2.9 10*3/uL (ref 0.7–4.0)
MCHC: 33.5 g/dL (ref 30.0–36.0)
MCV: 86 fl (ref 78.0–100.0)
Monocytes Absolute: 0.7 10*3/uL (ref 0.1–1.0)
Monocytes Relative: 9.6 % (ref 3.0–12.0)
Neutro Abs: 2.8 10*3/uL (ref 1.4–7.7)
Neutrophils Relative %: 40.6 % — ABNORMAL LOW (ref 43.0–77.0)
Platelets: 380 10*3/uL (ref 150.0–400.0)
RBC: 4.5 Mil/uL (ref 4.22–5.81)
RDW: 14.4 % (ref 11.5–15.5)
WBC: 7 10*3/uL (ref 4.0–10.5)

## 2019-05-31 LAB — COMPREHENSIVE METABOLIC PANEL
ALT: 12 U/L (ref 0–53)
AST: 11 U/L (ref 0–37)
Albumin: 4.5 g/dL (ref 3.5–5.2)
Alkaline Phosphatase: 71 U/L (ref 39–117)
BUN: 12 mg/dL (ref 6–23)
CO2: 27 mEq/L (ref 19–32)
Calcium: 10.1 mg/dL (ref 8.4–10.5)
Chloride: 101 mEq/L (ref 96–112)
Creatinine, Ser: 0.91 mg/dL (ref 0.40–1.50)
GFR: 101.07 mL/min (ref >60.00)
Glucose, Bld: 183 mg/dL — ABNORMAL HIGH (ref 70–99)
Potassium: 4.4 mEq/L (ref 3.5–5.1)
Sodium: 137 mEq/L (ref 135–145)
Total Bilirubin: 0.3 mg/dL (ref 0.2–1.2)
Total Protein: 8.4 g/dL — ABNORMAL HIGH (ref 6.0–8.3)

## 2019-05-31 NOTE — Telephone Encounter (Signed)
Spoke to patient and let him know his PET scan is scheduled on 06/07/19 to arrive at 6:45am at [facility] and be NPO after midnight and no Carbs for 24 hours before. He has an appt. with Oncology on 06/03/19. Also agreed to come in this week for CBC and CMET.

## 2019-06-03 ENCOUNTER — Inpatient Hospital Stay: Payer: Medicare Other | Attending: Internal Medicine | Admitting: Internal Medicine

## 2019-06-03 ENCOUNTER — Inpatient Hospital Stay: Payer: Medicare Other

## 2019-06-03 ENCOUNTER — Other Ambulatory Visit: Payer: Self-pay | Admitting: *Deleted

## 2019-06-03 ENCOUNTER — Other Ambulatory Visit: Payer: Self-pay

## 2019-06-03 ENCOUNTER — Encounter: Payer: Self-pay | Admitting: Internal Medicine

## 2019-06-03 VITALS — BP 144/58 | HR 86 | Temp 97.8°F | Resp 18 | Ht 75.0 in | Wt 219.6 lb

## 2019-06-03 DIAGNOSIS — R1310 Dysphagia, unspecified: Secondary | ICD-10-CM | POA: Diagnosis not present

## 2019-06-03 DIAGNOSIS — Z7984 Long term (current) use of oral hypoglycemic drugs: Secondary | ICD-10-CM | POA: Diagnosis not present

## 2019-06-03 DIAGNOSIS — Z7982 Long term (current) use of aspirin: Secondary | ICD-10-CM | POA: Insufficient documentation

## 2019-06-03 DIAGNOSIS — K7689 Other specified diseases of liver: Secondary | ICD-10-CM | POA: Insufficient documentation

## 2019-06-03 DIAGNOSIS — R590 Localized enlarged lymph nodes: Secondary | ICD-10-CM

## 2019-06-03 DIAGNOSIS — M1711 Unilateral primary osteoarthritis, right knee: Secondary | ICD-10-CM | POA: Insufficient documentation

## 2019-06-03 DIAGNOSIS — I4892 Unspecified atrial flutter: Secondary | ICD-10-CM | POA: Insufficient documentation

## 2019-06-03 DIAGNOSIS — K5900 Constipation, unspecified: Secondary | ICD-10-CM | POA: Insufficient documentation

## 2019-06-03 DIAGNOSIS — R918 Other nonspecific abnormal finding of lung field: Secondary | ICD-10-CM

## 2019-06-03 DIAGNOSIS — Z7901 Long term (current) use of anticoagulants: Secondary | ICD-10-CM | POA: Insufficient documentation

## 2019-06-03 DIAGNOSIS — I252 Old myocardial infarction: Secondary | ICD-10-CM | POA: Insufficient documentation

## 2019-06-03 DIAGNOSIS — Z87891 Personal history of nicotine dependence: Secondary | ICD-10-CM | POA: Insufficient documentation

## 2019-06-03 DIAGNOSIS — E785 Hyperlipidemia, unspecified: Secondary | ICD-10-CM | POA: Insufficient documentation

## 2019-06-03 DIAGNOSIS — R0789 Other chest pain: Secondary | ICD-10-CM | POA: Insufficient documentation

## 2019-06-03 DIAGNOSIS — Z79899 Other long term (current) drug therapy: Secondary | ICD-10-CM | POA: Insufficient documentation

## 2019-06-03 DIAGNOSIS — J984 Other disorders of lung: Secondary | ICD-10-CM | POA: Diagnosis present

## 2019-06-03 DIAGNOSIS — I10 Essential (primary) hypertension: Secondary | ICD-10-CM | POA: Diagnosis not present

## 2019-06-03 DIAGNOSIS — E1151 Type 2 diabetes mellitus with diabetic peripheral angiopathy without gangrene: Secondary | ICD-10-CM | POA: Diagnosis not present

## 2019-06-03 DIAGNOSIS — C787 Secondary malignant neoplasm of liver and intrahepatic bile duct: Secondary | ICD-10-CM | POA: Insufficient documentation

## 2019-06-03 LAB — CBC WITH DIFFERENTIAL (CANCER CENTER ONLY)
Abs Immature Granulocytes: 0.01 10*3/uL (ref 0.00–0.07)
Basophils Absolute: 0.1 10*3/uL (ref 0.0–0.1)
Basophils Relative: 1 %
Eosinophils Absolute: 0.5 10*3/uL (ref 0.0–0.5)
Eosinophils Relative: 6 %
HCT: 38.3 % — ABNORMAL LOW (ref 39.0–52.0)
Hemoglobin: 12.1 g/dL — ABNORMAL LOW (ref 13.0–17.0)
Immature Granulocytes: 0 %
Lymphocytes Relative: 42 %
Lymphs Abs: 3.5 10*3/uL (ref 0.7–4.0)
MCH: 28.4 pg (ref 26.0–34.0)
MCHC: 31.6 g/dL (ref 30.0–36.0)
MCV: 89.9 fL (ref 80.0–100.0)
Monocytes Absolute: 0.8 10*3/uL (ref 0.1–1.0)
Monocytes Relative: 10 %
Neutro Abs: 3.4 10*3/uL (ref 1.7–7.7)
Neutrophils Relative %: 41 %
Platelet Count: 351 10*3/uL (ref 150–400)
RBC: 4.26 MIL/uL (ref 4.22–5.81)
RDW: 13.3 % (ref 11.5–15.5)
WBC Count: 8.3 10*3/uL (ref 4.0–10.5)
nRBC: 0 % (ref 0.0–0.2)

## 2019-06-03 LAB — CMP (CANCER CENTER ONLY)
ALT: 11 U/L (ref 0–44)
AST: 11 U/L — ABNORMAL LOW (ref 15–41)
Albumin: 4.3 g/dL (ref 3.5–5.0)
Alkaline Phosphatase: 86 U/L (ref 38–126)
Anion gap: 11 (ref 5–15)
BUN: 11 mg/dL (ref 8–23)
CO2: 26 mmol/L (ref 22–32)
Calcium: 9.4 mg/dL (ref 8.9–10.3)
Chloride: 104 mmol/L (ref 98–111)
Creatinine: 1.02 mg/dL (ref 0.61–1.24)
GFR, Est AFR Am: >60 mL/min (ref >60)
GFR, Estimated: >60 mL/min (ref >60)
Glucose, Bld: 207 mg/dL — ABNORMAL HIGH (ref 70–99)
Potassium: 4.4 mmol/L (ref 3.5–5.1)
Sodium: 141 mmol/L (ref 135–145)
Total Bilirubin: 0.3 mg/dL (ref 0.3–1.2)
Total Protein: 8.4 g/dL — ABNORMAL HIGH (ref 6.5–8.1)

## 2019-06-03 NOTE — Progress Notes (Signed)
The proposed treatment discussed in cancer conference 06/03/19 is for discussion purpose only and is not a binding recommendation.  The patient was not physically examined nor present for their treatment options.  Therefore, final treatment plans cannot be decided.

## 2019-06-03 NOTE — Progress Notes (Signed)
[place] [phone]   [phone] Multidisciplinary thoracic oncology clinic  CONSULT NOTE  REFERRING PHYSICIAN: Dr. [place] Cellar  REASON FOR CONSULTATION:  64 years old African-American male with suspicious lung cancer.  HPI Kirk [name] is a 64 y.o. male with past medical history significant for hypertension, dyslipidemia, atrial flutter, osteoarthritis of the right knee, peripheral artery disease, NSTEMI, diabetes mellitus.  The patient was seen in the hospital in November 2020 complaining of chest pain and cardiac work-up was unremarkable at that time except for the atrial flutter.  He had intermittent dysphagia to solid food and he was referred to Dr. Havery Moros for evaluation.  He had upper endoscopy on May 17, 2019 and it showed an area of extrinsic compression in the mid to lower esophagus from 30-35 centimeters from the incisors narrowing the esophagus.  Colonoscopy performed on the same day showed 3 sessile polyps that were removed.  The patient had CT scan of the chest on May 26, 2019 and that showed extensive necrotic thoracic adenopathy including subcarinal nodal mass measuring 4.2 x 7.7 cm.  There was an adjacent right hilar adenopathy measuring 1.8 cm and the right infrahilar node measuring 1.9 cm.  Scan of the upper abdomen showed bilateral hypoattenuating liver lesions consistent with metastasis including 1.7 cm within the high left hepatic lobe.  There was also porta hepatis nodal metastasis including 4.6 x 4.4 cm. Dr. Havery Moros kindly referred the patient to me today for evaluation and recommendation regarding treatment of his condition. When seen today the patient is feeling fine with no concerning complaints except for nausea late in the day.  He also continues to have right-sided chest pain and he takes Advil on as-needed basis.  He has shortness of breath with exertion but no significant cough or hemoptysis.  He denied  having any fever or chills.  He has no weight loss or night sweats.  He continues to have constipation. Family history significant for mother with lymphoma, father and brother had prostate cancer and another brother had stroke. The patient is married and has 2 children.  He is Jehovah's Witness and would not accept blood transfusion.  He used to work in [employer].  He has a history of smoking up to 2 pack/day for around 35 years and quit 20 years ago.  He has history of alcohol in the past but not recently.  No history of drug abuse.  HPI  Past Medical History:  Diagnosis Date  . Arthritis    "right knee" (09/04/2015)  . Atrial flutter with rapid ventricular response ([place]) 04/19/2019  . HTN (hypertension)   . Hyperlipidemia   . NSTEMI (non-ST elevated myocardial infarction) ([place]) 09/04/2015   a. cath 09/05/2015: 95% stenosis mid-LAD, 55% mid-RCA, and 40% prox-RCA. PCI performed w/ Synergy DES to LAD  . PAD (peripheral artery disease) ([facility])    Stenting of bilateral iliacs in 2003.  [name] Refusal of blood transfusions as patient is Jehovah's Witness   . Type II diabetes mellitus ([place])     Past Surgical History:  Procedure Laterality Date  . CARDIAC CATHETERIZATION N/A 09/05/2015   Procedure: Left Heart Cath and Coronary Angiography;  Surgeon: Troy Sine, MD;  Location: [place];  Service: Cardiovascular;  Laterality: N/A;  . CARDIAC CATHETERIZATION N/A 09/05/2015   Procedure: Coronary Stent Intervention;  Surgeon: Troy Sine, MD;  Location: [place] [facility];  Service: Cardiovascular;  Laterality: N/A;  . ORIF CONGENITAL HIP DISLOCATION  Left ~ 1965   "put pins in it"  . PERIPHERAL VASCULAR CATHETERIZATION Bilateral 2003   "stenting"  . TONSILLECTOMY  ~ 1963    Family History  Problem Relation Age of Onset  . Lymphoma Mother   . Cancer - Prostate Father   . CAD Neg Hx   . Colon cancer Neg Hx   . Rectal cancer Neg Hx   . Stomach cancer Neg Hx   . Esophageal  cancer Neg Hx     Social History Social History   Tobacco Use  . Smoking status: Former Smoker    Packs/day: 2.00    Years: 35.00    Pack years: 70.00    Types: Cigarettes    Quit date: 02/19/2003    Years since quitting: 16.2  . Smokeless tobacco: Never Used  Substance Use Topics  . Alcohol use: Not Currently    Alcohol/week: 0.0 standard drinks    Comment: 09/04/2015 "nothing since 1980s"  . 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 acetaminophen (TYLENOL) 500 MG tablet Take 500 mg by mouth every 6 (six) hours as needed for fever.    [name] apixaban (ELIQUIS) 5 MG TABS tablet Take 1 tablet (5 mg total) by mouth 2 (two) times daily. 60 tablet 1  . aspirin EC 81 MG tablet Take 81 mg by mouth at bedtime.    . carvedilol (COREG) 6.25 MG tablet TAKE 1 TABLET BY MOUTH TWICE DAILY AFTER MEALS (Patient taking differently: Take 6.25 mg by mouth 2 (two) times daily with a meal. ) 180 tablet 2  . cetirizine (ZYRTEC) 10 MG tablet Take 1 tablet (10 mg total) by mouth daily. (Patient taking differently: Take 10 mg by mouth daily as needed for allergies or rhinitis. ) 30 tablet 11  . diltiazem (CARDIZEM CD) 120 MG 24 hr capsule Take 1 capsule (120 mg total) by mouth daily. 90 capsule 1  . doxycycline (VIBRA-TABS) 100 MG tablet Take 1 tablet (100 mg total) by mouth 2 (two) times daily. 20 tablet 0  . glimepiride (AMARYL) 4 MG tablet Take 1 tablet (4 mg total) by mouth daily. (Patient not taking: Reported on 05/17/2019) 90 tablet 1  . HYDROcodone-homatropine (HYCODAN) 5-1.5 MG/5ML syrup Take 5 mLs by mouth every 8 (eight) hours as needed for cough. 120 mL 0  . Hydrocortisone Acetate 1 % CREA Apply 15 g topically 2 (two) times daily. 15 g 3  . ipratropium (ATROVENT) 0.03 % nasal spray USE 2 SPRAYS IN EACH NOSTRIL TWICE DAILY (Patient not taking: No sig reported) 30 mL 2  .  lisinopril (ZESTRIL) 10 MG tablet Take 1 tablet (10 mg total) by mouth daily. (Patient taking differently: Take 10 mg by mouth at bedtime. ) 90 tablet 1  . metFORMIN (GLUCOPHAGE) 1000 MG tablet Take 1 tablet (1,000 mg total) by mouth 2 (two) times daily with a meal. 180 tablet 1  . multivitamin (ONE-A-DAY MEN'S) TABS tablet Take 1 tablet by mouth daily. (Patient not taking: Reported on 04/19/2019) 30 tablet 6  . nitroGLYCERIN (NITROSTAT) 0.4 MG SL tablet Place 1 tablet (0.4 mg total) under the tongue every 5 (five) minutes x 3 doses as needed for chest pain. (Patient not taking: Reported on 05/17/2019) 25 tablet 2  . pantoprazole (PROTONIX) 40 MG tablet Take 1 tablet (40 mg total) by mouth daily. 30 tablet 1  .  rosuvastatin (CRESTOR) 40 MG tablet TAKE 1 TABLET BY MOUTH DAILY 90 tablet 0  . sucralfate (CARAFATE) 1 GM/10ML suspension Take 10 mLs (1 g total) by mouth 4 (four) times daily. 420 mL 1   No current facility-administered medications for this visit.    Review of Systems  Constitutional: positive for fatigue and weight loss Eyes: negative Ears, nose, mouth, throat, and face: negative Respiratory: positive for dyspnea on exertion and pleurisy/chest pain Cardiovascular: negative Gastrointestinal: positive for dysphagia Genitourinary:negative Integument/breast: negative Hematologic/lymphatic: negative Musculoskeletal:negative Neurological: negative Behavioral/Psych: negative Endocrine: negative Allergic/Immunologic: negative  Physical Exam  ZOX:WRUEA, healthy, no distress, well nourished, well developed and anxious SKIN: skin color, texture, turgor are normal, no rashes or significant lesions HEAD: Normocephalic, No masses, lesions, tenderness or abnormalities EYES: normal, PERRLA, Conjunctiva are pink and non-injected EARS: External ears normal, Canals clear OROPHARYNX:no exudate, no erythema and lips, buccal mucosa, and tongue normal  NECK: supple, no adenopathy, no  JVD LYMPH:  no palpable lymphadenopathy, no hepatosplenomegaly LUNGS: clear to auscultation , and palpation HEART: regular rate & rhythm, no murmurs and no gallops ABDOMEN:abdomen soft, non-tender, normal bowel sounds and no masses or organomegaly BACK: No CVA tenderness, Range of motion is normal EXTREMITIES:no joint deformities, effusion, or inflammation, no edema  NEURO: alert & oriented x 3 with fluent speech, no focal motor/sensory deficits  PERFORMANCE STATUS: ECOG 1  LABORATORY DATA: Lab Results  Component Value Date   WBC 8.3 06/03/2019   HGB 12.1 (L) 06/03/2019   HCT 38.3 (L) 06/03/2019   MCV 89.9 06/03/2019   PLT 351 06/03/2019      Chemistry      Component Value Date/Time   NA 137 05/31/2019 1539   NA 140 06/08/2015 0840   K 4.4 05/31/2019 1539   CL 101 05/31/2019 1539   CO2 27 05/31/2019 1539   BUN 12 05/31/2019 1539   BUN 15 06/08/2015 0840   CREATININE 0.91 05/31/2019 1539   CREATININE 1.06 10/01/2016 0855      Component Value Date/Time   CALCIUM 10.1 05/31/2019 1539   ALKPHOS 71 05/31/2019 1539   AST 11 05/31/2019 1539   ALT 12 05/31/2019 1539   BILITOT 0.3 05/31/2019 1539   BILITOT 0.3 07/22/2018 0925       RADIOGRAPHIC STUDIES: CT CHEST W CONTRAST  Result Date: 05/26/2019 CLINICAL DATA:  Extrinsic compression of esophagus. Dysphagia for 2 months. Recent endoscopy. EXAM: CT CHEST WITH CONTRAST TECHNIQUE: Multidetector CT imaging of the chest was performed during intravenous contrast administration. CONTRAST:  39mL OMNIPAQUE IOHEXOL 300 MG/ML  SOLN COMPARISON:  Chest radiograph 04/19/2019. No prior CT. Upper endoscopy report from 05/17/2019 reviewed. This describes extrinsic compression of the middle third of the esophagus FINDINGS: Cardiovascular: Aortic and branch vessel atherosclerosis. Normal heart size, without pericardial effusion. Multivessel coronary artery atherosclerosis. No central pulmonary embolism, on this non-dedicated study.  Mediastinum/Nodes: No supraclavicular adenopathy. Extensive necrotic thoracic adenopathy. Subcarinal nodal mass measures 4.2 x 7.7 cm on 76/2. Contiguous or adjacent right hilar adenopathy at 1.8 cm on 72/2. Right infrahilar node measures 1.9 cm on 91/2. Lungs/Pleura: No pleural fluid. Patent airways, with extensive mass effect upon the carina. Moderate centrilobular emphysema. Minimal right lower lobe peribronchovascular interstitial thickening is nonspecific, including on 105/5. Right middle lobe scarring. No dominant pulmonary nodule or mass. Upper Abdomen: Bilateral hypoattenuating liver lesions, consistent with metastasis. Example 1.7 cm within the high left hepatic lobe on 130/2. Porta hepatis nodal metastasis including at 4.6 x 4.4 cm on 144/2. Normal imaged portions  of the spleen, stomach, pancreas, adrenal glands, kidneys. Musculoskeletal: Lower thoracic spondylosis. IMPRESSION: 1. Extensive thoracoabdominal adenopathy and liver lesions, consistent with metastatic disease. Favor small-cell lung primary versus less likely an extrathoracic primary. Consider multidisciplinary thoracic oncology referral for PET and sampling. 2. Aortic atherosclerosis (ICD10-I70.0), coronary artery atherosclerosis and emphysema (ICD10-J43.9). Electronically Signed   By: Abigail Miyamoto M.D.   On: 05/26/2019 16:13    ASSESSMENT: This is a very pleasant 64 years old African-American male with highly suspicious metastatic neoplasm, questionable for primary bronchogenic carcinoma but other malignancy could not be excluded at this point.  He presented with subcarinal lymphadenopathy with extrinsic compression on the esophagus resulting dysphagia.  He also has suspicious liver metastasis.   PLAN: I had a lengthy discussion with the patient today about his current condition and further investigation to confirm his diagnosis as well as staging work-up. The patient is scheduled to have a PET scan on June 06, 2018. I will wait for  the PET scan results before considering the patient for biopsy with either bronchoscopy and endobronchial ultrasound and biopsy of the subcarinal lymph node versus ultrasound-guided core biopsy of one of the liver lesion or any other accessible lesion seen on the PET scan. I will arrange for the patient to come back for follow-up visit after his PET scan as well as biopsy results for more detailed discussion of his treatment options. For the hypertension and diabetes mellitus, he will continue with his current home medication for now. The patient was advised to call immediately if he has any concerning symptoms in the interval. The patient voices understanding of current disease status and treatment options and is in agreement with the current care plan.  All questions were answered. The patient knows to call the clinic with any problems, questions or concerns. We can certainly see the patient much sooner if necessary.  Thank you so much for allowing me to participate in the care of Kirk [name]. I will continue to follow up the patient with you and assist in his care.  I spent 45 minutes counseling the patient face to face. The total time spent in the appointment was 70 minutes.  Disclaimer: This note was dictated with voice recognition software. Similar sounding words can inadvertently be transcribed and may not be corrected upon review.   Eilleen Kempf June 03, 2019, 3:40 PM

## 2019-06-04 ENCOUNTER — Encounter: Payer: Self-pay | Admitting: *Deleted

## 2019-06-04 ENCOUNTER — Other Ambulatory Visit: Payer: Self-pay | Admitting: Gastroenterology

## 2019-06-04 NOTE — Progress Notes (Signed)
Oncology Nurse Navigator Documentation  Oncology Nurse Navigator Flowsheets 06/04/2019  Abnormal Finding Date 05/26/2019  Navigator Location [facility]  Referral Date to RadOnc/MedOnc -  Navigator Encounter Type Clinic/MDC/I spoke with Kirk [name] yesterday at thoracic clinic.  Kirk [name] does not have dx of cancer but has abnormal ct scan.  Kirk [name] treatment plan is PET scan then tissue dx.  I explained to patient his treatment plan and educated on PET scan and pre-procedure instructions.  He verbalized understanding of next steps.   Telephone -  [facility] Date 06/03/2019  Multidisiplinary Clinic Type Thoracic  Patient Visit Type MedOnc  Treatment Phase Abnormal Scans  Barriers/Navigation Needs Education  Education Other  Interventions Education  Acuity Level 2-Minimal Needs (1-2 Barriers Identified)  Coordination of Care -  Education Method Verbal;Written  Time Spent with Patient 30

## 2019-06-07 ENCOUNTER — Other Ambulatory Visit: Payer: Self-pay

## 2019-06-07 ENCOUNTER — Encounter (HOSPITAL_COMMUNITY)
Admission: RE | Admit: 2019-06-07 | Discharge: 2019-06-07 | Disposition: A | Discharge status: Home / Self Care | Payer: Medicare Other | Source: Ambulatory Visit | Attending: Gastroenterology | Admitting: Gastroenterology

## 2019-06-07 DIAGNOSIS — I10 Essential (primary) hypertension: Secondary | ICD-10-CM | POA: Diagnosis not present

## 2019-06-07 DIAGNOSIS — Z7901 Long term (current) use of anticoagulants: Secondary | ICD-10-CM | POA: Diagnosis not present

## 2019-06-07 DIAGNOSIS — Z7984 Long term (current) use of oral hypoglycemic drugs: Secondary | ICD-10-CM | POA: Diagnosis not present

## 2019-06-07 DIAGNOSIS — C787 Secondary malignant neoplasm of liver and intrahepatic bile duct: Secondary | ICD-10-CM | POA: Diagnosis not present

## 2019-06-07 DIAGNOSIS — E1151 Type 2 diabetes mellitus with diabetic peripheral angiopathy without gangrene: Secondary | ICD-10-CM | POA: Insufficient documentation

## 2019-06-07 DIAGNOSIS — E785 Hyperlipidemia, unspecified: Secondary | ICD-10-CM | POA: Insufficient documentation

## 2019-06-07 DIAGNOSIS — Z7982 Long term (current) use of aspirin: Secondary | ICD-10-CM | POA: Diagnosis not present

## 2019-06-07 DIAGNOSIS — I252 Old myocardial infarction: Secondary | ICD-10-CM | POA: Insufficient documentation

## 2019-06-07 DIAGNOSIS — J984 Other disorders of lung: Secondary | ICD-10-CM | POA: Insufficient documentation

## 2019-06-07 DIAGNOSIS — Z87891 Personal history of nicotine dependence: Secondary | ICD-10-CM | POA: Insufficient documentation

## 2019-06-07 DIAGNOSIS — R590 Localized enlarged lymph nodes: Secondary | ICD-10-CM | POA: Diagnosis not present

## 2019-06-07 DIAGNOSIS — Z79899 Other long term (current) drug therapy: Secondary | ICD-10-CM | POA: Insufficient documentation

## 2019-06-07 LAB — GLUCOSE, CAPILLARY: Glucose-Capillary: 151 mg/dL — ABNORMAL HIGH (ref 70–99)

## 2019-06-07 MED ORDER — FLUDEOXYGLUCOSE F - 18 (FDG) INJECTION
10.9000 | Freq: Once | INTRAVENOUS | Status: AC | PRN
  Administered 2019-06-07: 10.9 via INTRAVENOUS

## 2019-06-08 DIAGNOSIS — I2510 Atherosclerotic heart disease of native coronary artery without angina pectoris: Secondary | ICD-10-CM | POA: Insufficient documentation

## 2019-06-08 NOTE — Progress Notes (Signed)
Cardiology Office Note    Date:  06/09/2019   ID:  Sherwood, Castilla 1955-06-27, MRN [id number]  PCP:  [name], FNP  Cardiologist: Lauree Chandler, MD EPS: None  No chief complaint on file.   History of Present Illness:  Kirk [name] is a 64 y.o. male with a history of NSTEMI 08/2015 s/p DES LAD, PAD with bilateral iliac stenting 2003, DM2, HTN, HLD, OA, Jehovah's Witness.   Patient had an admission on 03/2019 with chest pain shortness of breath and palpitations.  He was found to be in rapid atrial flutter spontaneously converting to normal sinus rhythm and was treated with diltiazem and Eliquis in addition to his Coreg.  CHA2DS2-VASc score equals 4.  Chest pain was mostly present after eating and the sensation of food being stuck in his throat.  He was to follow-up with GI.  CT of chest 05/26/2019 showed extensive thoracicoabdominal adenopathy and liver lesions consistent with metastatic disease.  Favored small cell lung primary versus less likely extra thoracic primary.  He was referred for to thoracic oncology.  Patient stopped taking eliquis-says it caused him to be short of breath and was going to cost him $464 for first month and then $130. Constipation on diltiazem. Supposed to have a biopsy soon. No further palpitations. Says he took Nyquil and cough medicine prior to A flutter.   Past Medical History:  Diagnosis Date  . Arthritis    "right knee" (09/04/2015)  . Atrial flutter with rapid ventricular response ([place]) 04/19/2019  . HTN (hypertension)   . Hyperlipidemia   . NSTEMI (non-ST elevated myocardial infarction) ([place]) 09/04/2015   a. cath 09/05/2015: 95% stenosis mid-LAD, 55% mid-RCA, and 40% prox-RCA. PCI performed w/ Synergy DES to LAD  . PAD (peripheral artery disease) ([facility])    Stenting of bilateral iliacs in 2003.  [name] Refusal of blood transfusions as patient is Jehovah's Witness   . Type II diabetes mellitus ([place])     Past Surgical History:    Procedure Laterality Date  . CARDIAC CATHETERIZATION N/A 09/05/2015   Procedure: Left Heart Cath and Coronary Angiography;  Surgeon: Troy Sine, MD;  Location: [facility];  Service: Cardiovascular;  Laterality: N/A;  . CARDIAC CATHETERIZATION N/A 09/05/2015   Procedure: Coronary Stent Intervention;  Surgeon: Troy Sine, MD;  Location: [facility];  Service: Cardiovascular;  Laterality: N/A;  . 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 PERIPHERAL VASCULAR CATHETERIZATION Bilateral 2003   "stenting"  . TONSILLECTOMY  ~ 1963    Current Medications: Current Meds  Medication Sig  . acetaminophen (TYLENOL) 500 MG tablet Take 500 mg by mouth every 6 (six) hours as needed for fever.  [name] apixaban (ELIQUIS) 5 MG TABS tablet Take 1 tablet (5 mg total) by mouth 2 (two) times daily.  [name] aspirin EC 81 MG tablet Take 81 mg by mouth at bedtime.  . carvedilol (COREG) 12.5 MG tablet Take 1 tablet (12.5 mg total) by mouth 2 (two) times daily with a meal.  . cetirizine (ZYRTEC) 10 MG tablet Take 1 tablet (10 mg total) by mouth daily.  . clopidogrel (PLAVIX) 75 MG tablet Take 75 mg by mouth daily.  [name] glimepiride (AMARYL) 4 MG tablet Take 1 tablet (4 mg total) by mouth daily.  [name] HYDROcodone-homatropine (HYCODAN) 5-1.5 MG/5ML syrup Take 5 mLs by mouth every 8 (eight) hours as needed for cough.  . Hydrocortisone Acetate 1 %  CREA Apply 15 g topically 2 (two) times daily.  [name] ipratropium (ATROVENT) 0.03 % nasal spray USE 2 SPRAYS IN EACH NOSTRIL TWICE DAILY  . lisinopril (ZESTRIL) 10 MG tablet Take 1 tablet (10 mg total) by mouth daily.  . metFORMIN (GLUCOPHAGE) 1000 MG tablet Take 1 tablet (1,000 mg total) by mouth 2 (two) times daily with a meal.  . multivitamin (ONE-A-DAY MEN'S) TABS tablet Take 1 tablet by mouth daily.  . nitroGLYCERIN (NITROSTAT) 0.4 MG SL tablet Place 1 tablet (0.4 mg total) under the tongue every 5 (five) minutes x 3 doses as needed for chest  pain.  . pantoprazole (PROTONIX) 40 MG tablet TAKE 1 TABLET BY MOUTH EVERY DAY  . rosuvastatin (CRESTOR) 40 MG tablet TAKE 1 TABLET BY MOUTH DAILY  . sucralfate (CARAFATE) 1 GM/10ML suspension Take 10 mLs (1 g total) by mouth 4 (four) times daily.  . [DISCONTINUED] carvedilol (COREG) 6.25 MG tablet TAKE 1 TABLET BY MOUTH TWICE DAILY AFTER MEALS  . [DISCONTINUED] diltiazem (CARDIZEM CD) 120 MG 24 hr capsule Take 1 capsule (120 mg total) by mouth daily.     Allergies:   Patient has no known allergies.   Social History   Socioeconomic History  . Marital status: Married    Spouse name: Not on file  . Number of children: 1  . Years of education: Not on file  . Highest education level: Not on file  Occupational History  . Occupation: Acupuncturist  Tobacco Use  . Smoking status: Former Smoker    Packs/day: 2.00    Years: 35.00    Pack years: 70.00    Types: Cigarettes    Quit date: 02/19/2003    Years since quitting: 16.3  . Smokeless tobacco: Never Used  Substance and Sexual Activity  . Alcohol use: Not Currently    Alcohol/week: 0.0 standard drinks    Comment: 09/04/2015 "nothing since 1980s"  . 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 Sexual activity: Not on file  Other Topics Concern  . Not on file  Social History Narrative  . Not on file   Social Determinants of Health   Financial Resource Strain:   . Difficulty of Paying Living Expenses: Not on file  Food Insecurity:   . Worried About Charity fundraiser in the Last Year: Not on file  . Ran Out of Food in the Last Year: Not on file  Transportation Needs:   . Lack of Transportation (Medical): Not on file  . Lack of Transportation (Non-Medical): Not on file  Physical Activity:   . Days of Exercise per Week: Not on file  . Minutes of Exercise per Session: Not on file  Stress:   . Feeling of Stress : Not on file  Social Connections:   . Frequency of Communication with Friends  and Family: Not on file  . Frequency of Social Gatherings with Friends and Family: Not on file  . Attends Religious Services: Not on file  . Active Member of Clubs or Organizations: Not on file  . Attends Archivist Meetings: Not on file  . Marital Status: Not on file     Family History:  The patient's   family history includes Cancer - Prostate in his father; Lymphoma in his mother.   ROS:   Please see the history of present illness.    ROS All other systems reviewed and are negative.   PHYSICAL EXAM:  VS:  BP 136/60   Pulse 79   Ht 6\' 3"  (1.905 m)   Wt 220 lb (99.8 kg)   SpO2 98%   BMI 27.50 kg/m   Physical Exam  GEN: Well nourished, well developed, in no acute distress  Neck: no JVD, carotid bruits, or masses Cardiac:RRR; no murmurs, rubs, or gallops  Respiratory:  clear to auscultation bilaterally, normal work of breathing GI: soft, nontender, nondistended, + BS Ext: without cyanosis, clubbing, or edema, Good distal pulses bilaterally Neuro:  Alert and Oriented x 3 Psych: euthymic mood, full affect  Wt Readings from Last 3 Encounters:  06/09/19 220 lb (99.8 kg)  06/03/19 219 lb 9.6 oz (99.6 kg)  05/17/19 224 lb (101.6 kg)      Studies/Labs Reviewed:   EKG:  EKG is  ordered today.  The ekg ordered today demonstrates normal sinus rhythm first-degree AV block  Recent Labs: 04/19/2019: TSH 0.367 06/03/2019: ALT 11; BUN 11; Creatinine 1.02; Hemoglobin 12.1; Platelet Count 351; Potassium 4.4; Sodium 141   Lipid Panel    Component Value Date/Time   CHOL 114 07/22/2018 0925   TRIG 111 07/22/2018 0925   HDL 32 (L) 07/22/2018 0925   CHOLHDL 3.6 07/22/2018 0925   CHOLHDL 3.7 03/12/2016 0837   VLDL 17 03/12/2016 0837   LDLCALC 60 07/22/2018 0925    Additional studies/ records that were reviewed today include:  2D echo 11/24/2020IMPRESSIONS      1. Left ventricular ejection fraction, by visual estimation, is 60 to 65%. The left ventricle has normal  function. There is mildly increased left ventricular hypertrophy.  2. The left ventricle has no regional wall motion abnormalities.  3. Global right ventricle has normal systolic function.The right ventricular size is normal. No increase in right ventricular wall thickness.  4. Left atrial size was mildly dilated.  5. Right atrial size was normal.  6. The mitral valve is normal in structure. No evidence of mitral valve regurgitation. No evidence of mitral stenosis.  7. The tricuspid valve is normal in structure. Tricuspid valve regurgitation is not demonstrated.  8. The aortic valve is normal in structure. Aortic valve regurgitation is not visualized. No evidence of aortic valve sclerosis or stenosis.  9. The pulmonic valve was normal in structure. Pulmonic valve regurgitation is not visualized. 10. TR signal is inadequate for assessing pulmonary artery systolic pressure. 11. The inferior vena cava is normal in size with greater than 50% respiratory variability, suggesting right atrial pressure of 3 mmHg.   In comparison to the previous echocardiogram(s): Prior examinations were reviewed in a side by side comparison of images. No change since 2017.  CTA chest 05/26/2019 IMPRESSION: 1. Extensive thoracoabdominal adenopathy and liver lesions, consistent with metastatic disease. Favor small-cell lung primary versus less likely an extrathoracic primary. Consider multidisciplinary thoracic oncology referral for PET and sampling. 2. Aortic atherosclerosis (ICD10-I70.0), coronary artery atherosclerosis and emphysema (ICD10-J43.9).     Electronically Signed   By: Abigail Miyamoto M.D.   On: 05/26/2019 16:13   PET scan 1/11/2021IMPRESSION: 1. Bulky mediastinal adenopathy and right hilar adenopathy, small supraclavicular lymph nodes and liver metastases with intense metabolic activity. Small cell lung cancer remains a differential consideration. However, in the presence of a large  hepatogastric nodal mass and retroperitoneal adenopathy gastrointestinal or abdominal malignancy is considered though there are no CT or PET findings that would allow for localization within the abdomen or pelvis. 2. Asymmetric right greater than left parapharyngeal activity is nonspecific, attention on follow-up.  Electronically Signed   By: Zetta Bills M.D.   On: 06/07/2019 10:21   ASSESSMENT:    1. Paroxysmal atrial flutter ([place])   2. Coronary artery disease involving native coronary artery of native heart without angina pectoris   3. Essential hypertension   4. Pure hypercholesterolemia   5. Diabetes mellitus with nephropathy ([place])   6. PAD (peripheral artery disease) ([place])   7. Mediastinal lymphadenopathy   8. LVH (left ventricular hypertrophy)      PLAN:  In order of problems listed above:  Atrial flutter converted to normal sinus rhythm in the emergency room.  CHA2DS2-VASc equals 4 on Eliquis diltiazem and Coreg but stopped Eliquis because of breath and cough.  He is back on aspirin.Will ask Jeani Hawking to look into cost for him.If he restarts Eliquis he will need to stop ASA.  2D echo with normal LV function EF 60 to 65% LA was mildly dilated. He is Jehovah witness and with metastatic cancer will discuss bleeding risk with Dr. Angelena Form.  Significant constipation on diltiazem.  I will stop diltiazem and increase carvedilol to 12.5 mg twice daily  CAD status post NSTEMI 08/2015 treated with DES to the LAD on Plavix and ASA now. No angina  Hypertension BP controlled  Hyperlipidemia on crestor. LDL 60 06/2018  DM type II managed by PCP, on Crestor  PAD with bilateral iliac stenting in 2003  Metastatic cancer work-up to find primary being done  Medication Adjustments/Labs and Tests Ordered: Current medicines are reviewed at length with the patient today.  Concerns regarding medicines are outlined above.  Medication changes, Labs and Tests ordered today are listed in the  Patient Instructions below. Patient Instructions  Your physician has recommended you make the following change in your medication:  STOP DILTIAZEM AND INCREASE CARVEDILOL TO 12.5 MG TWICE DAILY  CONTINUE ON ASPIRIN  WILL BE CHECKING ON Venturia  Your physician recommends that you schedule a follow-up appointment in: AS PLANNED     Signed, Ermalinda Barrios, PA-C  06/09/2019 9:04 AM    [facility] [place], [place], [place]  [address] Phone: [phone]; Fax: [phone]

## 2019-06-09 ENCOUNTER — Other Ambulatory Visit: Payer: Self-pay

## 2019-06-09 ENCOUNTER — Encounter: Payer: Self-pay | Admitting: Physician Assistant

## 2019-06-09 ENCOUNTER — Telehealth: Payer: Self-pay | Admitting: *Deleted

## 2019-06-09 ENCOUNTER — Ambulatory Visit (INDEPENDENT_AMBULATORY_CARE_PROVIDER_SITE_OTHER): Payer: Medicare Other | Admitting: Physician Assistant

## 2019-06-09 VITALS — BP 136/60 | HR 79 | Ht 75.0 in | Wt 220.0 lb

## 2019-06-09 DIAGNOSIS — E1121 Type 2 diabetes mellitus with diabetic nephropathy: Secondary | ICD-10-CM

## 2019-06-09 DIAGNOSIS — I517 Cardiomegaly: Secondary | ICD-10-CM

## 2019-06-09 DIAGNOSIS — R590 Localized enlarged lymph nodes: Secondary | ICD-10-CM

## 2019-06-09 DIAGNOSIS — E7800 Pure hypercholesterolemia, unspecified: Secondary | ICD-10-CM | POA: Diagnosis not present

## 2019-06-09 DIAGNOSIS — I2510 Atherosclerotic heart disease of native coronary artery without angina pectoris: Secondary | ICD-10-CM | POA: Diagnosis not present

## 2019-06-09 DIAGNOSIS — I10 Essential (primary) hypertension: Secondary | ICD-10-CM | POA: Diagnosis not present

## 2019-06-09 DIAGNOSIS — I4892 Unspecified atrial flutter: Secondary | ICD-10-CM | POA: Diagnosis not present

## 2019-06-09 DIAGNOSIS — I739 Peripheral vascular disease, unspecified: Secondary | ICD-10-CM

## 2019-06-09 MED ORDER — CARVEDILOL 12.5 MG PO TABS: 12.5000 mg | ORAL_TABLET | Freq: Two times a day (BID) | ORAL | 3 refills | Status: DC

## 2019-06-09 NOTE — Telephone Encounter (Signed)
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 PCP:  [name], FNP Primary Cvg:  Medicare/Medicare Part A And B Next Appt With Cardiology Lauree Chandler, MD) 07/22/2019 at 8:00 AM Message Received: Today Message Contents  Via, Deliah Boston, LPN  Richmond Campbell, LPN  Lila Lufkin, I just checked his chart again to be sure and there is a a [employer] card scanned in on 04/19/19 that says he has medication coverage.  Thanks       Previous Messages   ----- Message -----  From: Richmond Campbell, LPN  Sent: 6/37/8588 12:02 PM EST  To: Deliah Boston Via, LPN   I thought he just had Medicare so pt may qualify for assistance ?  ----- Message -----  From: Dennie Fetters, LPN  Sent: 09/26/7739 10:02 AM EST  To: Richmond Campbell, LPN   Unsure what Selinda Eon wants me to do but the pt has ins and should contact them to see what his options are. Also, since it is the beginning of the year there will more than likely be a deductible the pt needs to meet.  Thanks, Jeani Hawking  ----- Message -----  From: Richmond Campbell, LPN  Sent: 2/87/8676  9:04 AM EST  To: Deliah Boston Via, LPN   Selinda Eon would like for you to check on Eliquis cost for pt was different prices not sure if is eligible for assistance .  Thanks  [place]

## 2019-06-09 NOTE — Patient Instructions (Signed)
Your physician has recommended you make the following change in your medication:  STOP DILTIAZEM AND INCREASE CARVEDILOL TO 12.5 MG TWICE DAILY  CONTINUE ON ASPIRIN  WILL BE CHECKING ON [place]  Your physician recommends that you schedule a follow-up appointment in: AS PLANNED

## 2019-06-09 NOTE — Progress Notes (Signed)
Discussed with Dr. Angelena Form who recommends eliquis or coumadin but if patient can't afford or is not willing to take he can stay on ASA and Plavix. Plavix can be held if he needs a biopsy. Thanks, Selinda Eon

## 2019-06-10 ENCOUNTER — Telehealth: Payer: Self-pay | Admitting: *Deleted

## 2019-06-10 ENCOUNTER — Other Ambulatory Visit: Payer: Self-pay | Admitting: Internal Medicine

## 2019-06-10 ENCOUNTER — Telehealth: Payer: Self-pay | Admitting: Medical Oncology

## 2019-06-10 DIAGNOSIS — R590 Localized enlarged lymph nodes: Secondary | ICD-10-CM

## 2019-06-10 NOTE — Telephone Encounter (Signed)
Pt asking for next plan of care. Message given to [facility] and she will f/u with pt.

## 2019-06-10 NOTE — Telephone Encounter (Signed)
Oncology Nurse Navigator Documentation  Oncology Nurse Navigator Flowsheets 06/10/2019  Abnormal Finding Date -  Navigator Location [facility]  Referral Date to RadOnc/MedOnc -  Navigator Encounter Type Telephone/I called patient to update on cancer conference discussion.  He will be getting bx by IR on neck or liver.  Patient verbalized understanding that radiology will call and scheduled  Telephone Outgoing Call  [facility] Date -  Multidisiplinary Clinic Type -  Patient Visit Type -  Treatment Phase Abnormal Scans  Barriers/Navigation Needs Education  Education Other  Interventions Education  Acuity Level 2-Minimal Needs (1-2 Barriers Identified)  Coordination of Care -  Education Method Verbal  Time Spent with Patient 15

## 2019-06-10 NOTE — Telephone Encounter (Signed)
Patient updated on bx plan per Dr. [name]

## 2019-06-14 ENCOUNTER — Encounter (HOSPITAL_COMMUNITY): Payer: Self-pay | Admitting: Radiology

## 2019-06-14 NOTE — Progress Notes (Signed)
Kirk [name] Male, 64 y.o., 08/06/1955 MRN:  [id number] Phone:  [phone] (M) ... PCP:  [name], FNP Primary Cvg:  Medicare/Medicare Part A And B Next Appt With Radiology ([facility]) 06/24/2019 at 1:00 PM FW: Biopsy Received: 3 days ago Message Contents  Donn Pierini D  Please schedule Monday       Previous Messages   ----- Message -----  From: Arne Cleveland, MD  Sent: 06/11/2019  4:53 PM EST  To: Lenore Cordia  Subject: RE: Biopsy                    Ok   [place] core liver lesion  Supraclav too small for cores    DDH    ----- Message -----  From: Lenore Cordia  Sent: 06/11/2019  8:02 AM EST  To: Ir Procedure Requests  Subject: Biopsy                      Procedure Requested: US guided core biopsy   Reason for Procedure:  left supraclavicular lymph node. if not possible, will try liver biopsy    Provider Requesting: Dr Curt Bears  Provider Telephone: [phone]    Other Info: rad exam in Epic

## 2019-06-16 ENCOUNTER — Encounter: Payer: Self-pay | Admitting: *Deleted

## 2019-06-16 ENCOUNTER — Telehealth: Payer: Self-pay | Admitting: Internal Medicine

## 2019-06-16 NOTE — Progress Notes (Signed)
Oncology Nurse Navigator Documentation  Oncology Nurse Navigator Flowsheets 06/16/2019  Abnormal Finding Date -  Navigator Location [facility]  Referral Date to RadOnc/MedOnc -  Navigator Encounter Type Other/I followed up on Mr. Hamme scheduled. He is set up for his bx next week but not a follow up with Dr. [name]. I updated scheduling dept to call patient to be scheduled on 2/1 with an appt with Dr. [name] and labs.   Telephone -  [facility] Date -  Multidisiplinary Clinic Type -  Patient Visit Type -  Treatment Phase Abnormal Scans  Barriers/Navigation Needs Coordination of Care  Education -  Interventions Coordination of Care  Acuity Level 2-Minimal Needs (1-2 Barriers Identified)  Coordination of Care Other  Education Method -  Time Spent with Patient 30

## 2019-06-16 NOTE — Telephone Encounter (Signed)
Scheduled appt per 1/20 sch message - pt aware of appt date and time   

## 2019-06-23 ENCOUNTER — Other Ambulatory Visit: Payer: Self-pay | Admitting: Radiology

## 2019-06-24 ENCOUNTER — Encounter (HOSPITAL_COMMUNITY): Payer: Self-pay

## 2019-06-24 ENCOUNTER — Ambulatory Visit (HOSPITAL_COMMUNITY)
Admission: RE | Admit: 2019-06-24 | Discharge: 2019-06-24 | Disposition: A | Discharge status: Home / Self Care | Payer: Medicare Other | Source: Ambulatory Visit | Attending: Internal Medicine | Admitting: Internal Medicine

## 2019-06-24 ENCOUNTER — Other Ambulatory Visit: Payer: Self-pay

## 2019-06-24 ENCOUNTER — Other Ambulatory Visit: Payer: Self-pay | Admitting: Internal Medicine

## 2019-06-24 ENCOUNTER — Encounter: Payer: Self-pay | Admitting: Internal Medicine

## 2019-06-24 DIAGNOSIS — Z8042 Family history of malignant neoplasm of prostate: Secondary | ICD-10-CM | POA: Diagnosis not present

## 2019-06-24 DIAGNOSIS — R590 Localized enlarged lymph nodes: Secondary | ICD-10-CM

## 2019-06-24 DIAGNOSIS — Z79899 Other long term (current) drug therapy: Secondary | ICD-10-CM | POA: Insufficient documentation

## 2019-06-24 DIAGNOSIS — E1151 Type 2 diabetes mellitus with diabetic peripheral angiopathy without gangrene: Secondary | ICD-10-CM | POA: Insufficient documentation

## 2019-06-24 DIAGNOSIS — I10 Essential (primary) hypertension: Secondary | ICD-10-CM | POA: Insufficient documentation

## 2019-06-24 DIAGNOSIS — Z7984 Long term (current) use of oral hypoglycemic drugs: Secondary | ICD-10-CM | POA: Diagnosis not present

## 2019-06-24 DIAGNOSIS — C770 Secondary and unspecified malignant neoplasm of lymph nodes of head, face and neck: Secondary | ICD-10-CM | POA: Diagnosis not present

## 2019-06-24 DIAGNOSIS — I252 Old myocardial infarction: Secondary | ICD-10-CM | POA: Diagnosis not present

## 2019-06-24 DIAGNOSIS — Z807 Family history of other malignant neoplasms of lymphoid, hematopoietic and related tissues: Secondary | ICD-10-CM | POA: Insufficient documentation

## 2019-06-24 DIAGNOSIS — Z7901 Long term (current) use of anticoagulants: Secondary | ICD-10-CM | POA: Diagnosis not present

## 2019-06-24 DIAGNOSIS — Z7982 Long term (current) use of aspirin: Secondary | ICD-10-CM | POA: Insufficient documentation

## 2019-06-24 DIAGNOSIS — E785 Hyperlipidemia, unspecified: Secondary | ICD-10-CM | POA: Insufficient documentation

## 2019-06-24 DIAGNOSIS — I4892 Unspecified atrial flutter: Secondary | ICD-10-CM | POA: Insufficient documentation

## 2019-06-24 DIAGNOSIS — Z87891 Personal history of nicotine dependence: Secondary | ICD-10-CM | POA: Diagnosis not present

## 2019-06-24 HISTORY — DX: Dyspnea, unspecified: R06.00

## 2019-06-24 LAB — CBC WITH DIFFERENTIAL/PLATELET
Abs Immature Granulocytes: 0.01 10*3/uL (ref 0.00–0.07)
Basophils Absolute: 0.1 10*3/uL (ref 0.0–0.1)
Basophils Relative: 1 %
Eosinophils Absolute: 0.6 10*3/uL — ABNORMAL HIGH (ref 0.0–0.5)
Eosinophils Relative: 7 %
HCT: 39.1 % (ref 39.0–52.0)
Hemoglobin: 12.3 g/dL — ABNORMAL LOW (ref 13.0–17.0)
Immature Granulocytes: 0 %
Lymphocytes Relative: 40 %
Lymphs Abs: 3.2 10*3/uL (ref 0.7–4.0)
MCH: 27.8 pg (ref 26.0–34.0)
MCHC: 31.5 g/dL (ref 30.0–36.0)
MCV: 88.3 fL (ref 80.0–100.0)
Monocytes Absolute: 0.7 10*3/uL (ref 0.1–1.0)
Monocytes Relative: 8 %
Neutro Abs: 3.6 10*3/uL (ref 1.7–7.7)
Neutrophils Relative %: 44 %
Platelets: 332 10*3/uL (ref 150–400)
RBC: 4.43 MIL/uL (ref 4.22–5.81)
RDW: 13.2 % (ref 11.5–15.5)
WBC: 8.1 10*3/uL (ref 4.0–10.5)
nRBC: 0 % (ref 0.0–0.2)

## 2019-06-24 LAB — COMPREHENSIVE METABOLIC PANEL
ALT: 15 U/L (ref 0–44)
AST: 17 U/L (ref 15–41)
Albumin: 4.6 g/dL (ref 3.5–5.0)
Alkaline Phosphatase: 63 U/L (ref 38–126)
Anion gap: 9 (ref 5–15)
BUN: 10 mg/dL (ref 8–23)
CO2: 24 mmol/L (ref 22–32)
Calcium: 9.7 mg/dL (ref 8.9–10.3)
Chloride: 106 mmol/L (ref 98–111)
Creatinine, Ser: 0.94 mg/dL (ref 0.61–1.24)
GFR calc Af Amer: >60 mL/min (ref >60)
GFR calc non Af Amer: >60 mL/min (ref >60)
Glucose, Bld: 162 mg/dL — ABNORMAL HIGH (ref 70–99)
Potassium: 4.4 mmol/L (ref 3.5–5.1)
Sodium: 139 mmol/L (ref 135–145)
Total Bilirubin: 0.6 mg/dL (ref 0.3–1.2)
Total Protein: 8.7 g/dL — ABNORMAL HIGH (ref 6.5–8.1)

## 2019-06-24 LAB — PROTIME-INR
INR: 1.1 (ref 0.8–1.2)
Prothrombin Time: 13.8 seconds (ref 11.4–15.2)

## 2019-06-24 LAB — GLUCOSE, CAPILLARY: Glucose-Capillary: 153 mg/dL — ABNORMAL HIGH (ref 70–99)

## 2019-06-24 MED ORDER — MIDAZOLAM HCL 2 MG/2ML IJ SOLN
INTRAMUSCULAR | Status: AC
  Filled 2019-06-24: qty 2

## 2019-06-24 MED ORDER — FENTANYL CITRATE (PF) 100 MCG/2ML IJ SOLN
INTRAMUSCULAR | Status: AC
  Filled 2019-06-24: qty 2

## 2019-06-24 MED ORDER — FENTANYL CITRATE (PF) 100 MCG/2ML IJ SOLN
INTRAMUSCULAR | Status: AC | PRN
  Administered 2019-06-24 (×2): 50 ug via INTRAVENOUS

## 2019-06-24 MED ORDER — MIDAZOLAM HCL 2 MG/2ML IJ SOLN
INTRAMUSCULAR | Status: AC | PRN
  Administered 2019-06-24 (×2): 1 mg via INTRAVENOUS

## 2019-06-24 MED ORDER — LIDOCAINE HCL 1 % IJ SOLN
INTRAMUSCULAR | Status: AC | PRN
  Administered 2019-06-24: 1 mL

## 2019-06-24 MED ORDER — SODIUM CHLORIDE 0.9 % IV SOLN: INTRAVENOUS | Status: DC

## 2019-06-24 MED ORDER — LIDOCAINE HCL 1 % IJ SOLN
INTRAMUSCULAR | Status: AC
  Filled 2019-06-24: qty 20

## 2019-06-24 NOTE — Discharge Instructions (Signed)
Moderate Conscious Sedation, Adult, Care After These instructions provide you with information about caring for yourself after your procedure. Your health care provider may also give you more specific instructions. Your treatment has been planned according to current medical practices, but problems sometimes occur. Call your health care provider if you have any problems or questions after your procedure. What can I expect after the procedure? After your procedure, it is common:  To feel sleepy for several hours.  To feel clumsy and have poor balance for several hours.  To have poor judgment for several hours.  To vomit if you eat too soon. Follow these instructions at home: For at least 24 hours after the procedure:   Do not: ? Participate in activities where you could fall or become injured. ? Drive. ? Use heavy machinery. ? Drink alcohol. ? Take sleeping pills or medicines that cause drowsiness. ? Make important decisions or sign legal documents. ? Take care of children on your own.  Rest. Eating and drinking  Follow the diet recommended by your health care provider.  If you vomit: ? Drink water, juice, or soup when you can drink without vomiting. ? Make sure you have little or no nausea before eating solid foods. General instructions  Have a responsible adult stay with you until you are awake and alert.  Take over-the-counter and prescription medicines only as told by your health care provider.  If you smoke, do not smoke without supervision.  Keep all follow-up visits as told by your health care provider. This is important. Contact a health care provider if:  You keep feeling nauseous or you keep vomiting.  You feel light-headed.  You develop a rash.  You have a fever. Get help right away if:  You have trouble breathing. This information is not intended to replace advice given to you by your health care provider. Make sure you discuss any questions you have  with your health care provider. Document Revised: 04/25/2017 Document Reviewed: 09/02/2015 Elsevier Patient Education  2020 Elsevier Inc. Needle Biopsy, Care After These instructions tell you how to care for yourself after your procedure. Your doctor may also give you more specific instructions. Call your doctor if you have any problems or questions. What can I expect after the procedure? After the procedure, it is common to have:  Soreness.  Bruising.  Mild pain. Follow these instructions at home:   Return to your normal activities as told by your doctor. Ask your doctor what activities are safe for you.  Take over-the-counter and prescription medicines only as told by your doctor.  Wash your hands with soap and water before you change your bandage (dressing). If you cannot use soap and water, use hand sanitizer.  Follow instructions from your doctor about: ? How to take care of your puncture site. ? When and how to change your bandage. ? When to remove your bandage.  Check your puncture site every day for signs of infection. Watch for: ? Redness, swelling, or pain. ? Fluid or blood. ? Pus or a bad smell. ? Warmth.  Do not take baths, swim, or use a hot tub until your doctor approves. Ask your doctor if you may take showers. You may only be allowed to take sponge baths.  Keep all follow-up visits as told by your doctor. This is important. Contact a doctor if you have:  A fever.  Redness, swelling, or pain at the puncture site, and it lasts longer than a few days.  Fluid,   blood, or pus coming from the puncture site.  Warmth coming from the puncture site. Get help right away if:  You have a lot of bleeding from the puncture site. Summary  After the procedure, it is common to have soreness, bruising, or mild pain at the puncture site.  Check your puncture site every day for signs of infection, such as redness, swelling, or pain.  Get help right away if you have  severe bleeding from your puncture site. This information is not intended to replace advice given to you by your health care provider. Make sure you discuss any questions you have with your health care provider. Document Revised: 05/26/2017 Document Reviewed: 05/26/2017 Elsevier Patient Education  2020 Elsevier Inc.  

## 2019-06-24 NOTE — Procedures (Signed)
Interventional Radiology Procedure Note  Procedure: US guided core biopsy of LEFT supraclavicular LN.   Complications: None  Estimated Blood Loss: None  Recommendations: - Path sent - DC home  Signed,  Criselda Peaches, MD

## 2019-06-24 NOTE — H&P (Signed)
Chief Complaint: Patient was seen in consultation today for left supraclavicular lymphadenopathy and liver mass/biopsy.  Referring Physician(s): Mohamed,Mohamed  Supervising Physician: Jacqulynn Cadet  Patient Status: [facility] - Out-pt  History of Present Illness: Kirk [name] is a 64 y.o. male with a past medical history of hypertension, hyperlipidemia, MI 2017, PAD s/p bilateral iliac stents 2003, atrial flutter, diabetes mellitus type II, and arthritis. He has imaging findings suspicious for metastatic disease that were obtained during chest pain workup. He was referred to oncology for further management who is requesting biopsy for tissue diagnosis.  CT chest 05/26/2019: 1. Extensive thoracoabdominal adenopathy and liver lesions, consistent with metastatic disease. Favor small-cell lung primary versus less likely an extrathoracic primary. Consider multidisciplinary thoracic oncology referral for PET and sampling. 2. Aortic atherosclerosis (ICD10-I70.0), coronary artery atherosclerosis and emphysema (ICD10-J43.9).  NM PET 06/07/2019: 1. Bulky mediastinal adenopathy and right hilar adenopathy, small supraclavicular lymph nodes and liver metastases with intense metabolic activity. Small cell lung cancer remains a differential consideration. However, in the presence of a large hepatogastric nodal mass and retroperitoneal adenopathy gastrointestinal or abdominal malignancy is considered though there are no CT or PET findings that would allow for localization within the abdomen or pelvis. 2. Asymmetric right greater than left parapharyngeal activity is nonspecific, attention on follow-up.  IR requested by Dr. [name] for possible image-guided left supraclavicular lymph node vs liver mass biopsy for tissue diagnosis. Patient awake and alert laying in bed with no complaints at this time. Denies fever, chills, chest pain, dyspnea, abdominal pain, or headache.    Past Medical History:    Diagnosis Date  . Arthritis    "right knee" (09/04/2015)  . Atrial flutter with rapid ventricular response ([place]) 04/19/2019  . Dyspnea    increased exertion  . HTN (hypertension)   . Hyperlipidemia   . NSTEMI (non-ST elevated myocardial infarction) ([place]) 09/04/2015   a. cath 09/05/2015: 95% stenosis mid-LAD, 55% mid-RCA, and 40% prox-RCA. PCI performed w/ Synergy DES to LAD  . PAD (peripheral artery disease) ([facility])    Stenting of bilateral iliacs in 2003.  [name] Refusal of blood transfusions as patient is Jehovah's Witness   . Type II diabetes mellitus ([place])     Past Surgical History:  Procedure Laterality Date  . CARDIAC CATHETERIZATION N/A 09/05/2015   Procedure: Left Heart Cath and Coronary Angiography;  Surgeon: Troy Sine, MD;  Location: [place] [facility];  Service: Cardiovascular;  Laterality: N/A;  . CARDIAC CATHETERIZATION N/A 09/05/2015   Procedure: Coronary Stent Intervention;  Surgeon: Troy Sine, MD;  Location: [place] [facility];  Service: Cardiovascular;  Laterality: N/A;  . 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 PERIPHERAL VASCULAR CATHETERIZATION Bilateral 2003   "stenting"  . TONSILLECTOMY  ~ 1963    Allergies: Patient has no known allergies.  Medications: Prior to Admission medications   Medication Sig Start Date End Date Taking? Authorizing Provider  acetaminophen (TYLENOL) 500 MG tablet Take 500 mg by mouth every 6 (six) hours as needed for fever.   Yes [provider]  aspirin EC 81 MG tablet Take 81 mg by mouth at bedtime.   Yes [provider]  carvedilol (COREG) 12.5 MG tablet Take 1 tablet (12.5 mg total) by mouth 2 (two) times daily with a meal. 06/09/19  Yes Imogene Burn, PA-C  cetirizine (ZYRTEC) 10 MG tablet Take 1 tablet (10 mg total) by mouth daily. 07/08/18  Yes Kathe Becton  M, FNP  clopidogrel (PLAVIX) 75 MG tablet Take 75 mg by mouth daily.   Yes [provider]  glimepiride (AMARYL)  4 MG tablet Take 1 tablet (4 mg total) by mouth daily. 02/19/19  Yes [name], FNP  HYDROcodone-homatropine [facility]) 5-1.5 MG/5ML syrup Take 5 mLs by mouth every 8 (eight) hours as needed for cough. 05/10/19  Yes [name], FNP  ibuprofen (ADVIL) 200 MG tablet Take 600 mg by mouth once.   Yes [provider]  ipratropium (ATROVENT) 0.03 % nasal spray USE 2 SPRAYS IN EACH NOSTRIL TWICE DAILY 01/13/19  Yes [name], FNP  lisinopril (ZESTRIL) 10 MG tablet Take 1 tablet (10 mg total) by mouth daily. 02/19/19  Yes [name], FNP  metFORMIN (GLUCOPHAGE) 1000 MG tablet Take 1 tablet (1,000 mg total) by mouth 2 (two) times daily with a meal. 03/29/19  Yes [name], FNP  rosuvastatin (CRESTOR) 40 MG tablet TAKE 1 TABLET BY MOUTH DAILY 05/12/19  Yes Burnell Blanks, MD  apixaban (ELIQUIS) 5 MG TABS tablet Take 1 tablet (5 mg total) by mouth 2 (two) times daily. 04/20/19   Cheryln Manly, NP  Hydrocortisone Acetate 1 % CREA Apply 15 g topically 2 (two) times daily. 05/10/19   [name], FNP  multivitamin (ONE-A-DAY MEN'S) TABS tablet Take 1 tablet by mouth daily. 11/11/18   [name], FNP  nitroGLYCERIN (NITROSTAT) 0.4 MG SL tablet Place 1 tablet (0.4 mg total) under the tongue every 5 (five) minutes x 3 doses as needed for chest pain. 08/26/17   Imogene Burn, PA-C  pantoprazole (PROTONIX) 40 MG tablet TAKE 1 TABLET BY MOUTH EVERY DAY 06/04/19   Armbruster, Carlota Raspberry, MD  sucralfate (CARAFATE) 1 GM/10ML suspension Take 10 mLs (1 g total) by mouth 4 (four) times daily. 05/13/19   Armbruster, Carlota Raspberry, MD     Family History  Problem Relation Age of Onset  . Lymphoma Mother   . Cancer - Prostate Father   . CAD Neg Hx   . Colon cancer Neg Hx   . Rectal cancer Neg Hx   . Stomach cancer Neg Hx   . Esophageal cancer Neg Hx     Social History   Socioeconomic History  . Marital status: Married    Spouse name: Not on file  . Number  of children: 1  . Years of education: Not on file  . Highest education level: Not on file  Occupational History  . Occupation: Acupuncturist  Tobacco Use  . Smoking status: Former Smoker    Packs/day: 2.00    Years: 35.00    Pack years: 70.00    Types: Cigarettes    Quit date: 02/19/2003    Years since quitting: 16.3  . Smokeless tobacco: Never Used  Substance and Sexual Activity  . Alcohol use: Not Currently    Alcohol/week: 0.0 standard drinks    Comment: 09/04/2015 "nothing since 1980s"  . 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 Sexual activity: Not on file  Other Topics Concern  . Not on file  Social History Narrative  . Not on file   Social Determinants of Health   Financial Resource Strain:   . Difficulty of Paying Living Expenses: Not on file  Food Insecurity:   . Worried About Charity fundraiser in the Last Year: Not on file  . Ran Out of  Food in the Last Year: Not on file  Transportation Needs:   . Lack of Transportation (Medical): Not on file  . Lack of Transportation (Non-Medical): Not on file  Physical Activity:   . Days of Exercise per Week: Not on file  . Minutes of Exercise per Session: Not on file  Stress:   . Feeling of Stress : Not on file  Social Connections:   . Frequency of Communication with Friends and Family: Not on file  . Frequency of Social Gatherings with Friends and Family: Not on file  . Attends Religious Services: Not on file  . Active Member of Clubs or Organizations: Not on file  . Attends Archivist Meetings: Not on file  . Marital Status: Not on file     Review of Systems: A 12 point ROS discussed and pertinent positives are indicated in the HPI above.  All other systems are negative.  Review of Systems  Vital Signs: BP (!) 154/68 (BP Location: Left Arm)   Pulse 94   Temp 98.3 F (36.8 C) (Oral)   Resp 18   SpO2 100%   Physical Exam   MD Evaluation Airway:  WNL Heart: WNL Abdomen: WNL Chest/ Lungs: WNL ASA  Classification: 3 Mallampati/Airway Score: One   Imaging: CT CHEST W CONTRAST  Result Date: 05/26/2019 CLINICAL DATA:  Extrinsic compression of esophagus. Dysphagia for 2 months. Recent endoscopy. EXAM: CT CHEST WITH CONTRAST TECHNIQUE: Multidetector CT imaging of the chest was performed during intravenous contrast administration. CONTRAST:  52mL OMNIPAQUE IOHEXOL 300 MG/ML  SOLN COMPARISON:  Chest radiograph 04/19/2019. No prior CT. Upper endoscopy report from 05/17/2019 reviewed. This describes extrinsic compression of the middle third of the esophagus FINDINGS: Cardiovascular: Aortic and branch vessel atherosclerosis. Normal heart size, without pericardial effusion. Multivessel coronary artery atherosclerosis. No central pulmonary embolism, on this non-dedicated study. Mediastinum/Nodes: No supraclavicular adenopathy. Extensive necrotic thoracic adenopathy. Subcarinal nodal mass measures 4.2 x 7.7 cm on 76/2. Contiguous or adjacent right hilar adenopathy at 1.8 cm on 72/2. Right infrahilar node measures 1.9 cm on 91/2. Lungs/Pleura: No pleural fluid. Patent airways, with extensive mass effect upon the carina. Moderate centrilobular emphysema. Minimal right lower lobe peribronchovascular interstitial thickening is nonspecific, including on 105/5. Right middle lobe scarring. No dominant pulmonary nodule or mass. Upper Abdomen: Bilateral hypoattenuating liver lesions, consistent with metastasis. Example 1.7 cm within the high left hepatic lobe on 130/2. Porta hepatis nodal metastasis including at 4.6 x 4.4 cm on 144/2. Normal imaged portions of the spleen, stomach, pancreas, adrenal glands, kidneys. Musculoskeletal: Lower thoracic spondylosis. IMPRESSION: 1. Extensive thoracoabdominal adenopathy and liver lesions, consistent with metastatic disease. Favor small-cell lung primary versus less likely an extrathoracic primary. Consider multidisciplinary  thoracic oncology referral for PET and sampling. 2. Aortic atherosclerosis (ICD10-I70.0), coronary artery atherosclerosis and emphysema (ICD10-J43.9). Electronically Signed   By: Abigail Miyamoto M.D.   On: 05/26/2019 16:13   NM PET Image Initial (PI) Skull Base To Thigh  Result Date: 06/07/2019 CLINICAL DATA:  Initial treatment strategy for mediastinal adenopathy. EXAM: NUCLEAR MEDICINE PET SKULL BASE TO THIGH TECHNIQUE: 10.9 mCi F-18 FDG was injected intravenously. Full-ring PET imaging was performed from the skull base to thigh after the radiotracer. CT data was obtained and used for attenuation correction and anatomic localization. Fasting blood glucose: 151 mg/dl COMPARISON:  CT chest May 26, 2019 FINDINGS: Mediastinal blood pool activity: SUV max 2.99 Liver activity: SUV max 4.26 NECK: Bilateral low neck/supraclavicular lymph nodes with increased FDG uptake. (SUVmax = 9.34)  with respect to the left 7 mm lymph node, similar activity seen on the right in a smaller right supraclavicular lymph node No additional areas suspicious hypermetabolic activity in the neck. Nonspecific parapharyngeal activity bilaterally with slight asymmetry favoring right over the left. Max SUV 5.13 Incidental CT findings: Calcified atherosclerotic changes throughout the carotid arteries, mild-to-moderate. Scattered smaller lymph nodes without significant increased FDG uptake. CHEST: Bulky subcarinal nodal mass. Left and right superior mediastinal lymph nodes with increased FDG uptake and right hilar nodal disease also with increased FDG uptake. Right axillary lymph node with increased metabolic activity. (Image 80, series 4) 5.1 x 4.5 cm mmm subcarinal nodal mass, stable in terms of size compared to recent chest CT, contiguous with lymph nodes tracking along the left mainstem bronchus. (SUVmax = 16.66) (Image 64, series 4) bulky retrotracheal lymph node 2.3 cm stable compared to recent comparison study (SUVmax = 14.44) Right  axillary lymph node (image 60, series 4) 9 mm (SUVmax = 7.17) Numerous other lymph nodes in the mediastinum with intense FDG uptake in the superior mediastinum just below the thoracic inlet. Also along the right paratracheal chain. These are smaller than previously described lymph nodes. Right infrahilar lymph node or central pulmonary nodule (image 89, series 4) (SUVmax = 11.04 Smaller lymph nodes in the right hilum, difficult to measure given lack of contrast and small size also with intense FDG uptake. No visible left hilar adenopathy. Small lymph nodes adjacent to esophagus just above the esophageal hiatus, largest 10 mm without increased FDG uptake.) Incidental CT findings: Signs of scattered atherosclerotic change. Calcified coronary artery disease. No signs of aneurysm in the chest. No signs of pericardial effusion. Other than central hilar lymph node or small nodule described above there is no suspicious mass or pulmonary nodule on today's study, limited assessment due to motion artifact. No consolidation or effusion. No signs of chest wall mass. ABDOMEN/PELVIS: Hepatogastric lymph nodes, retroperitoneal lymph nodes all with hypermetabolic characteristics. Liver metastases also with increased FDG uptake. (Image 116, series 4) 4.9 x 4.2 cm hepatic gastric lymph node showing low attenuation throughout but showing diffuse hyper metabolic features (SUVmax = 11.35) Right juxta-crural lymph node 16 mm (image 122, series 4) (SUVmax = 17.6. Similar sized lymph node in the retrocaval region with similar FDG uptake (image 131, series 4)) With respect to liver lesions the largest along the course of middle hepatic vein measures 2.5 cm, difficult to compared to prior study given different phase of enhancement but approximately 2.2 cm (image 109, series 4) (SUVmax = 11.67) Second lesion along the right hepatic margin, smaller (image 104, series 4) 1.6 cm on today's study, again difficult to compare to the prior study  where it measured approximately 1.4 cm. (SUVmax = 6.25) Hypodense lesion in the right hepatic lobe inferiorly along the gallbladder fossa measuring 1.8 cm without FDG uptake above background liver activity. Incidental CT findings: Extensive calcific atherosclerotic changes throughout the abdominal aorta extending in the iliac vessels, no signs of aneurysm. SKELETON: No focal hypermetabolic activity to suggest skeletal metastasis.w Incidental CT findings: Left femoral ORIF with screws traversing the left femoral neck. No destructive bone process. IMPRESSION: 1. Bulky mediastinal adenopathy and right hilar adenopathy, small supraclavicular lymph nodes and liver metastases with intense metabolic activity. Small cell lung cancer remains a differential consideration. However, in the presence of a large hepatogastric nodal mass and retroperitoneal adenopathy gastrointestinal or abdominal malignancy is considered though there are no CT or PET findings that would allow for localization within the  abdomen or pelvis. 2. Asymmetric right greater than left parapharyngeal activity is nonspecific, attention on follow-up. Electronically Signed   By: Zetta Bills M.D.   On: 06/07/2019 10:21    Labs:  CBC: Recent Labs    04/19/19 1046 04/20/19 0406 05/31/19 1539 06/03/19 1527  WBC 9.2 7.3 7.0 8.3  HGB 12.5* 10.9* 13.0 12.1*  HCT 39.5 33.4* 38.7* 38.3*  PLT 434* 367 380.0 351    COAGS: Recent Labs    04/19/19 1614  INR 1.1  APTT 27    BMP: Recent Labs    10/01/18 1747 10/01/18 1747 04/19/19 1046 04/20/19 0406 05/31/19 1539 06/03/19 1527  NA 141   < > 135 138 137 141  K 3.9   < > 4.3 3.9 4.4 4.4  CL 104   < > 100 105 101 104  CO2 25   < > 25 24 27 26   GLUCOSE 122*   < > 272* 227* 183* 207*  BUN 8   < > 11 13 12 11   CALCIUM 9.8   < > 9.2 9.0 10.1 9.4  CREATININE 1.02   < > 1.08 1.12 0.91 1.02  GFRNONAA >60  --  >60 >60  --  >60  GFRAA >60  --  >60 >60  --  >60   < > = values in this  interval not displayed.    LIVER FUNCTION TESTS: Recent Labs    04/19/19 1120 04/20/19 0406 05/31/19 1539 06/03/19 1527  BILITOT 0.4 0.8 0.3 0.3  AST 18 15 11  11*  ALT 21 17 12 11   ALKPHOS 58 48 71 86  PROT 8.1 6.8 8.4* 8.4*  ALBUMIN 3.5 3.1* 4.5 4.3     Assessment and Plan:  Left supraclavicular lymphadenopathy and liver mass- findings concerning for metastatic disease. Plan for image-guided left supraclavicular lymph node or liver mass biopsy today in IR. Patient is NPO. Afebrile. Last dose Plavix 06/16/2019- ok to proceed per IR protocol. INR pending.  Risks and benefits discussed with the patient including, but not limited to bleeding, infection, damage to adjacent structures or low yield requiring additional tests. All of the patient's questions were answered, patient is agreeable to proceed. Consent signed and in chart.   Thank you for this interesting consult.  I greatly enjoyed meeting DENISE BRAMBLETT and look forward to participating in their care.  A copy of this report was sent to the requesting provider on this date.  Electronically Signed: Earley Abide, PA-C 06/24/2019, 11:45 AM   I spent a total of 40 Minutes in face to face in clinical consultation, greater than 50% of which was counseling/coordinating care for left supraclavicular lymphadenopathy and liver mass/biopsy.

## 2019-06-25 ENCOUNTER — Other Ambulatory Visit: Payer: Self-pay | Admitting: Physician Assistant

## 2019-06-25 DIAGNOSIS — C787 Secondary malignant neoplasm of liver and intrahepatic bile duct: Secondary | ICD-10-CM

## 2019-06-28 ENCOUNTER — Inpatient Hospital Stay: Payer: Medicare Other

## 2019-06-28 ENCOUNTER — Other Ambulatory Visit: Payer: Self-pay

## 2019-06-28 ENCOUNTER — Other Ambulatory Visit: Payer: Self-pay | Admitting: Medical Oncology

## 2019-06-28 ENCOUNTER — Inpatient Hospital Stay: Payer: Medicare Other | Attending: Internal Medicine | Admitting: Internal Medicine

## 2019-06-28 ENCOUNTER — Encounter: Payer: Self-pay | Admitting: Internal Medicine

## 2019-06-28 ENCOUNTER — Other Ambulatory Visit: Payer: Medicare Other

## 2019-06-28 ENCOUNTER — Encounter: Payer: Self-pay | Admitting: Medical Oncology

## 2019-06-28 VITALS — BP 138/70 | HR 85 | Temp 97.8°F | Resp 18 | Ht 75.0 in | Wt 218.4 lb

## 2019-06-28 DIAGNOSIS — R519 Headache, unspecified: Secondary | ICD-10-CM | POA: Diagnosis not present

## 2019-06-28 DIAGNOSIS — Z7902 Long term (current) use of antithrombotics/antiplatelets: Secondary | ICD-10-CM | POA: Insufficient documentation

## 2019-06-28 DIAGNOSIS — Z5111 Encounter for antineoplastic chemotherapy: Secondary | ICD-10-CM | POA: Diagnosis present

## 2019-06-28 DIAGNOSIS — I2510 Atherosclerotic heart disease of native coronary artery without angina pectoris: Secondary | ICD-10-CM

## 2019-06-28 DIAGNOSIS — Z7952 Long term (current) use of systemic steroids: Secondary | ICD-10-CM | POA: Diagnosis not present

## 2019-06-28 DIAGNOSIS — C3491 Malignant neoplasm of unspecified part of right bronchus or lung: Secondary | ICD-10-CM

## 2019-06-28 DIAGNOSIS — Z79899 Other long term (current) drug therapy: Secondary | ICD-10-CM | POA: Insufficient documentation

## 2019-06-28 DIAGNOSIS — Z7901 Long term (current) use of anticoagulants: Secondary | ICD-10-CM | POA: Insufficient documentation

## 2019-06-28 DIAGNOSIS — I252 Old myocardial infarction: Secondary | ICD-10-CM | POA: Insufficient documentation

## 2019-06-28 DIAGNOSIS — Z7189 Other specified counseling: Secondary | ICD-10-CM

## 2019-06-28 DIAGNOSIS — G893 Neoplasm related pain (acute) (chronic): Secondary | ICD-10-CM

## 2019-06-28 DIAGNOSIS — R5383 Other fatigue: Secondary | ICD-10-CM | POA: Diagnosis not present

## 2019-06-28 DIAGNOSIS — Z791 Long term (current) use of non-steroidal anti-inflammatories (NSAID): Secondary | ICD-10-CM | POA: Diagnosis not present

## 2019-06-28 DIAGNOSIS — C787 Secondary malignant neoplasm of liver and intrahepatic bile duct: Secondary | ICD-10-CM | POA: Insufficient documentation

## 2019-06-28 DIAGNOSIS — Z5112 Encounter for antineoplastic immunotherapy: Secondary | ICD-10-CM | POA: Diagnosis present

## 2019-06-28 DIAGNOSIS — M1990 Unspecified osteoarthritis, unspecified site: Secondary | ICD-10-CM | POA: Diagnosis not present

## 2019-06-28 DIAGNOSIS — Z7984 Long term (current) use of oral hypoglycemic drugs: Secondary | ICD-10-CM | POA: Diagnosis not present

## 2019-06-28 DIAGNOSIS — R1011 Right upper quadrant pain: Secondary | ICD-10-CM | POA: Diagnosis not present

## 2019-06-28 DIAGNOSIS — Z7982 Long term (current) use of aspirin: Secondary | ICD-10-CM | POA: Diagnosis not present

## 2019-06-28 DIAGNOSIS — E1151 Type 2 diabetes mellitus with diabetic peripheral angiopathy without gangrene: Secondary | ICD-10-CM | POA: Insufficient documentation

## 2019-06-28 DIAGNOSIS — R590 Localized enlarged lymph nodes: Secondary | ICD-10-CM | POA: Diagnosis not present

## 2019-06-28 DIAGNOSIS — I10 Essential (primary) hypertension: Secondary | ICD-10-CM | POA: Insufficient documentation

## 2019-06-28 DIAGNOSIS — E785 Hyperlipidemia, unspecified: Secondary | ICD-10-CM | POA: Insufficient documentation

## 2019-06-28 LAB — CBC WITH DIFFERENTIAL (CANCER CENTER ONLY)
Abs Immature Granulocytes: 0.03 10*3/uL (ref 0.00–0.07)
Basophils Absolute: 0.1 10*3/uL (ref 0.0–0.1)
Basophils Relative: 1 %
Eosinophils Absolute: 0.6 10*3/uL — ABNORMAL HIGH (ref 0.0–0.5)
Eosinophils Relative: 7 %
HCT: 39.7 % (ref 39.0–52.0)
Hemoglobin: 12.7 g/dL — ABNORMAL LOW (ref 13.0–17.0)
Immature Granulocytes: 0 %
Lymphocytes Relative: 37 %
Lymphs Abs: 3 10*3/uL (ref 0.7–4.0)
MCH: 28.1 pg (ref 26.0–34.0)
MCHC: 32 g/dL (ref 30.0–36.0)
MCV: 87.8 fL (ref 80.0–100.0)
Monocytes Absolute: 0.6 10*3/uL (ref 0.1–1.0)
Monocytes Relative: 7 %
Neutro Abs: 3.9 10*3/uL (ref 1.7–7.7)
Neutrophils Relative %: 48 %
Platelet Count: 334 10*3/uL (ref 150–400)
RBC: 4.52 MIL/uL (ref 4.22–5.81)
RDW: 13.2 % (ref 11.5–15.5)
WBC Count: 8.2 10*3/uL (ref 4.0–10.5)
nRBC: 0 % (ref 0.0–0.2)

## 2019-06-28 LAB — CMP (CANCER CENTER ONLY)
ALT: 15 U/L (ref 0–44)
AST: 15 U/L (ref 15–41)
Albumin: 4.4 g/dL (ref 3.5–5.0)
Alkaline Phosphatase: 73 U/L (ref 38–126)
Anion gap: 11 (ref 5–15)
BUN: 12 mg/dL (ref 8–23)
CO2: 27 mmol/L (ref 22–32)
Calcium: 9.7 mg/dL (ref 8.9–10.3)
Chloride: 102 mmol/L (ref 98–111)
Creatinine: 1.09 mg/dL (ref 0.61–1.24)
GFR, Est AFR Am: >60 mL/min (ref >60)
GFR, Estimated: >60 mL/min (ref >60)
Glucose, Bld: 152 mg/dL — ABNORMAL HIGH (ref 70–99)
Potassium: 4.2 mmol/L (ref 3.5–5.1)
Sodium: 140 mmol/L (ref 135–145)
Total Bilirubin: 0.5 mg/dL (ref 0.3–1.2)
Total Protein: 8.8 g/dL — ABNORMAL HIGH (ref 6.5–8.1)

## 2019-06-28 LAB — SURGICAL PATHOLOGY

## 2019-06-28 MED ORDER — OXYCODONE-ACETAMINOPHEN 5-325 MG PO TABS: 1.0000 | ORAL_TABLET | Freq: Four times a day (QID) | ORAL | 0 refills | Status: DC | PRN

## 2019-06-28 NOTE — Progress Notes (Signed)
[facility] Telephone:([phone]   [phone]) [phone]  OFFICE PROGRESS NOTE  [name], FNP [name] [address]  DIAGNOSIS: Stage IV (TX, N3, M1c) non-small cell lung cancer, poorly differentiated adenocarcinoma presented with bulky mediastinal lymphadenopathy in addition to right axillary lymphadenopathy and metastatic liver lesions, abdominal lymphadenopathy diagnosed in January 2021.  PRIOR THERAPY: None  CURRENT THERAPY:None  INTERVAL HISTORY: Kirk [name] 64 y.o. male returns to the clinic today for follow-up visit.  The patient's wife and daughter were available by phone during the visit.  He is feeling fine today with no concerning complaints except for intermittent pain in the right upper quadrant of the abdomen.  He denied having any current chest pain, shortness of breath, cough or hemoptysis.  He denied having any fever or chills.  He has no nausea, vomiting, diarrhea or constipation.  He has no recent weight loss or night sweats.  He underwent ultrasound-guided core biopsy of the left supraclavicular lymph node by interventional audiology and the preliminary pathology is consistent with poorly differentiated adenocarcinoma of lung primary.  The patient is here today for evaluation and discussion of his treatment options.  MEDICAL HISTORY: Past Medical History:  Diagnosis Date  . Arthritis    "right knee" (09/04/2015)  . Atrial flutter with rapid ventricular response ([facility]) 04/19/2019  . Dyspnea    increased exertion  . HTN (hypertension)   . Hyperlipidemia   . NSTEMI (non-ST elevated myocardial infarction) ([facility]) 09/04/2015   a. cath 09/05/2015: 95% stenosis mid-LAD, 55% mid-RCA, and 40% prox-RCA. PCI performed w/ Synergy DES to LAD  . PAD (peripheral artery disease) (HCC)    Stenting of bilateral iliacs in 2003.  [name] Refusal of blood transfusions as patient is Jehovah's Witness   . Type II diabetes mellitus ([facility])      ALLERGIES:  has No Known Allergies.  MEDICATIONS:  Current Outpatient Medications  Medication Sig Dispense Refill  . acetaminophen (TYLENOL) 500 MG tablet Take 500 mg by mouth every 6 (six) hours as needed for fever.    [name] apixaban (ELIQUIS) 5 MG TABS tablet Take 1 tablet (5 mg total) by mouth 2 (two) times daily. 60 tablet 1  . aspirin EC 81 MG tablet Take 81 mg by mouth at bedtime.    . carvedilol (COREG) 12.5 MG tablet Take 1 tablet (12.5 mg total) by mouth 2 (two) times daily with a meal. 180 tablet 3  . cetirizine (ZYRTEC) 10 MG tablet Take 1 tablet (10 mg total) by mouth daily. 30 tablet 11  . clopidogrel (PLAVIX) 75 MG tablet Take 75 mg by mouth daily.    [name] glimepiride (AMARYL) 4 MG tablet Take 1 tablet (4 mg total) by mouth daily. 90 tablet 1  . HYDROcodone-homatropine (HYCODAN) 5-1.5 MG/5ML syrup Take 5 mLs by mouth every 8 (eight) hours as needed for cough. 120 mL 0  . Hydrocortisone Acetate 1 % CREA Apply 15 g topically 2 (two) times daily. 15 g 3  . ibuprofen (ADVIL) 200 MG tablet Take 600 mg by mouth once.    [name] ipratropium (ATROVENT) 0.03 % nasal spray USE 2 SPRAYS IN EACH NOSTRIL TWICE DAILY 30 mL 2  . lisinopril (ZESTRIL) 10 MG tablet Take 1 tablet (10 mg total) by mouth daily. 90 tablet 1  . metFORMIN (GLUCOPHAGE) 1000 MG tablet Take 1 tablet (1,000 mg total) by mouth 2 (two) times daily with a meal. 180 tablet 1  . multivitamin (ONE-A-DAY  MEN'S) TABS tablet Take 1 tablet by mouth daily. 30 tablet 6  . nitroGLYCERIN (NITROSTAT) 0.4 MG SL tablet Place 1 tablet (0.4 mg total) under the tongue every 5 (five) minutes x 3 doses as needed for chest pain. 25 tablet 2  . pantoprazole (PROTONIX) 40 MG tablet TAKE 1 TABLET BY MOUTH EVERY DAY 30 tablet 5  . rosuvastatin (CRESTOR) 40 MG tablet TAKE 1 TABLET BY MOUTH DAILY 90 tablet 0  . sucralfate (CARAFATE) 1 GM/10ML suspension Take 10 mLs (1 g total) by mouth 4 (four) times daily. 420 mL 1   No current facility-administered  medications for this visit.    SURGICAL HISTORY:  Past Surgical History:  Procedure Laterality Date  . CARDIAC CATHETERIZATION N/A 09/05/2015   Procedure: Left Heart Cath and Coronary Angiography;  Surgeon: Troy Sine, MD;  Location: [facility];  Service: Cardiovascular;  Laterality: N/A;  . CARDIAC CATHETERIZATION N/A 09/05/2015   Procedure: Coronary Stent Intervention;  Surgeon: Troy Sine, MD;  Location: [facility];  Service: Cardiovascular;  Laterality: N/A;  . 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 PERIPHERAL VASCULAR CATHETERIZATION Bilateral 2003   "stenting"  . TONSILLECTOMY  ~ 1963    REVIEW OF SYSTEMS:  Constitutional: positive for fatigue Eyes: negative Ears, nose, mouth, throat, and face: negative Respiratory: negative Cardiovascular: negative Gastrointestinal: positive for abdominal pain Genitourinary:negative Integument/breast: negative Hematologic/lymphatic: negative Musculoskeletal:negative Neurological: negative Behavioral/Psych: negative Endocrine: negative Allergic/Immunologic: negative   PHYSICAL EXAMINATION: General appearance: alert, cooperative and appears stated age Head: Normocephalic, without obvious abnormality, atraumatic Neck: no adenopathy, no JVD, supple, symmetrical, trachea midline and thyroid not enlarged, symmetric, no tenderness/mass/nodules Lymph nodes: Cervical, supraclavicular, and axillary nodes normal. Resp: clear to auscultation bilaterally Back: symmetric, no curvature. ROM normal. No CVA tenderness. Cardio: regular rate and rhythm, S1, S2 normal, no murmur, click, rub or gallop GI: abnormal findings:  Mild tenderness to palpation in the right upper quadrant. Extremities: extremities normal, atraumatic, no cyanosis or edema Neurologic: Alert and oriented X 3, normal strength and tone. Normal symmetric reflexes. Normal coordination and gait  ECOG PERFORMANCE STATUS: 1 - Symptomatic  but completely ambulatory  Blood pressure 138/70, pulse 85, temperature 97.8 F (36.6 C), temperature source Temporal, resp. rate 18, height 6\' 3"  (1.905 m), weight 218 lb 6.4 oz (99.1 kg), SpO2 98 %.  LABORATORY DATA: Lab Results  Component Value Date   WBC 8.2 06/28/2019   HGB 12.7 (L) 06/28/2019   HCT 39.7 06/28/2019   MCV 87.8 06/28/2019   PLT 334 06/28/2019      Chemistry      Component Value Date/Time   NA 139 06/24/2019 1130   NA 140 06/08/2015 0840   K 4.4 06/24/2019 1130   CL 106 06/24/2019 1130   CO2 24 06/24/2019 1130   BUN 10 06/24/2019 1130   BUN 15 06/08/2015 0840   CREATININE 0.94 06/24/2019 1130   CREATININE 1.02 06/03/2019 1527   CREATININE 1.06 10/01/2016 0855      Component Value Date/Time   CALCIUM 9.7 06/24/2019 1130   ALKPHOS 63 06/24/2019 1130   AST 17 06/24/2019 1130   AST 11 (L) 06/03/2019 1527   ALT 15 06/24/2019 1130   ALT 11 06/03/2019 1527   BILITOT 0.6 06/24/2019 1130   BILITOT 0.3 06/03/2019 1527       RADIOGRAPHIC STUDIES: NM PET Image Initial (PI) Skull Base To Thigh  Result Date: 06/07/2019 CLINICAL DATA:  Initial treatment strategy for mediastinal adenopathy. EXAM: NUCLEAR MEDICINE PET SKULL BASE TO THIGH TECHNIQUE: 10.9 mCi F-18 FDG was injected intravenously. Full-ring PET imaging was performed from the skull base to thigh after the radiotracer. CT data was obtained and used for attenuation correction and anatomic localization. Fasting blood glucose: 151 mg/dl COMPARISON:  CT chest May 26, 2019 FINDINGS: Mediastinal blood pool activity: SUV max 2.99 Liver activity: SUV max 4.26 NECK: Bilateral low neck/supraclavicular lymph nodes with increased FDG uptake. (SUVmax = 9.34) with respect to the left 7 mm lymph node, similar activity seen on the right in a smaller right supraclavicular lymph node No additional areas suspicious hypermetabolic activity in the neck. Nonspecific parapharyngeal activity bilaterally with slight asymmetry  favoring right over the left. Max SUV 5.13 Incidental CT findings: Calcified atherosclerotic changes throughout the carotid arteries, mild-to-moderate. Scattered smaller lymph nodes without significant increased FDG uptake. CHEST: Bulky subcarinal nodal mass. Left and right superior mediastinal lymph nodes with increased FDG uptake and right hilar nodal disease also with increased FDG uptake. Right axillary lymph node with increased metabolic activity. (Image 80, series 4) 5.1 x 4.5 cm mmm subcarinal nodal mass, stable in terms of size compared to recent chest CT, contiguous with lymph nodes tracking along the left mainstem bronchus. (SUVmax = 16.66) (Image 64, series 4) bulky retrotracheal lymph node 2.3 cm stable compared to recent comparison study (SUVmax = 14.44) Right axillary lymph node (image 60, series 4) 9 mm (SUVmax = 7.17) Numerous other lymph nodes in the mediastinum with intense FDG uptake in the superior mediastinum just below the thoracic inlet. Also along the right paratracheal chain. These are smaller than previously described lymph nodes. Right infrahilar lymph node or central pulmonary nodule (image 89, series 4) (SUVmax = 11.04 Smaller lymph nodes in the right hilum, difficult to measure given lack of contrast and small size also with intense FDG uptake. No visible left hilar adenopathy. Small lymph nodes adjacent to esophagus just above the esophageal hiatus, largest 10 mm without increased FDG uptake.) Incidental CT findings: Signs of scattered atherosclerotic change. Calcified coronary artery disease. No signs of aneurysm in the chest. No signs of pericardial effusion. Other than central hilar lymph node or small nodule described above there is no suspicious mass or pulmonary nodule on today's study, limited assessment due to motion artifact. No consolidation or effusion. No signs of chest wall mass. ABDOMEN/PELVIS: Hepatogastric lymph nodes, retroperitoneal lymph nodes all with hypermetabolic  characteristics. Liver metastases also with increased FDG uptake. (Image 116, series 4) 4.9 x 4.2 cm hepatic gastric lymph node showing low attenuation throughout but showing diffuse hyper metabolic features (SUVmax = 11.35) Right juxta-crural lymph node 16 mm (image 122, series 4) (SUVmax = 17.6. Similar sized lymph node in the retrocaval region with similar FDG uptake (image 131, series 4)) With respect to liver lesions the largest along the course of middle hepatic vein measures 2.5 cm, difficult to compared to prior study given different phase of enhancement but approximately 2.2 cm (image 109, series 4) (SUVmax = 11.67) Second lesion along the right hepatic margin, smaller (image 104, series 4) 1.6 cm on today's study, again difficult to compare to the prior study where it measured approximately 1.4 cm. (SUVmax = 6.25) Hypodense lesion in the right hepatic lobe inferiorly along the gallbladder fossa measuring 1.8 cm without FDG uptake above background liver activity. Incidental CT findings: Extensive calcific atherosclerotic changes throughout the abdominal aorta extending in the iliac vessels, no signs of aneurysm. SKELETON: No  focal hypermetabolic activity to suggest skeletal metastasis.w Incidental CT findings: Left femoral ORIF with screws traversing the left femoral neck. No destructive bone process. IMPRESSION: 1. Bulky mediastinal adenopathy and right hilar adenopathy, small supraclavicular lymph nodes and liver metastases with intense metabolic activity. Small cell lung cancer remains a differential consideration. However, in the presence of a large hepatogastric nodal mass and retroperitoneal adenopathy gastrointestinal or abdominal malignancy is considered though there are no CT or PET findings that would allow for localization within the abdomen or pelvis. 2. Asymmetric right greater than left parapharyngeal activity is nonspecific, attention on follow-up. Electronically Signed   By: Zetta Bills  M.D.   On: 06/07/2019 10:21   [place] CORE BIOPSY (LYMPH NODES)  Result Date: 06/24/2019 INDICATION: 64 year old male with suspected metastatic disease of uncertain primary etiology. Small cell lung cancer is suspected. Patient presents for ultrasound-guided core biopsy of supraclavicular lymph node. EXAM: ULTRASOUND OF THE LYMPH NODES MEDICATIONS: None. ANESTHESIA/SEDATION: Moderate (conscious) sedation was employed during this procedure. A total of Versed to mg and Fentanyl 100 mcg was administered intravenously. Moderate Sedation Time: 14 minutes. The patient's level of consciousness and vital signs were monitored continuously by radiology nursing throughout the procedure under my direct supervision. FLUOROSCOPY TIME:  None. COMPLICATIONS: None immediate. PROCEDURE: Informed written consent was obtained from the patient after a thorough discussion of the procedural risks, benefits and alternatives. All questions were addressed. Maximal Sterile Barrier Technique was utilized including caps, mask, sterile gowns, sterile gloves, sterile drape, hand hygiene and skin antiseptic. A timeout was performed prior to the initiation of the procedure. Ultrasound was used to interrogate the left supraclavicular fossa. A large lobulated lymph node measuring 3.2 x 1.4 cm is successfully identified. This lymph node is both enlarged and morphologically abnormal. The overlying skin was sterilely prepped and draped in the standard fashion using chlorhexidine skin prep. Local anesthesia was attained by infiltration with 1% lidocaine. A small dermatotomy was made. Under real-time ultrasound guidance, multiple 18 gauge core biopsies were obtained using the [employer] mission automated biopsy device. Biopsy specimens were placed in formalin and delivered to pathology for further analysis. Post biopsy ultrasound imaging demonstrates no evidence of hemorrhage or other complication. The patient tolerated the procedure well. IMPRESSION: Successful  ultrasound-guided core biopsy of left supraclavicular lymphadenopathy. Electronically Signed   By: Jacqulynn Cadet M.D.   On: 06/24/2019 18:55    ASSESSMENT AND PLAN: This is a very pleasant 64 years old African-American male recently diagnosed with a stage IV (TX, N3, M1c) non-small cell lung cancer, poorly differentiated adenocarcinoma presented with bulky mediastinal as well as right hilar, supraclavicular, abdominal and right axillary lymphadenopathy in addition to metastatic liver lesions diagnosed in January 2021. I had a lengthy discussion with the patient and his family today about his current disease stage, prognosis and treatment options.  I discussed with the patient his prognosis with and without treatment. I recommended for the patient to complete the staging work-up by ordering MRI of the brain to rule out brain metastasis. I will also send blood test to [facility] for molecular studies. I discussed with the patient enrollment in the clinical trial where the patient will be tested for [facility] where the patient will be tested for KEAP1 or NRF2 mutation and if positive he may be eligible for additional treatment withTelaglenastat in addition to stander systemic chemotherapy with carboplatin, Alimta and Keytruda. The patient is interested in the clinical trial and he will be seen by the clinical research nurse later  today for evaluation and discussion of this option. We will see him back for follow-up visit in around 10 days for evaluation and more detailed discussion of his treatment options based on the molecular studies. For pain management, I will start the patient on Percocet 5/325 mg p.o. every 6 hours as needed. For constipation he was advised to take stool softener and also MiraLAX if needed. The patient was advised to call immediately if he has any other concerning symptoms in the interval. The patient voices understanding of current disease status and treatment options and is  in agreement with the current care plan.  All questions were answered. The patient knows to call the clinic with any problems, questions or concerns. We can certainly see the patient much sooner if necessary.  Disclaimer: This note was dictated with voice recognition software. Similar sounding words can inadvertently be transcribed and may not be corrected upon review.

## 2019-06-28 NOTE — Progress Notes (Signed)
[name] Patient in clinic this morning to see Dr. [name]. Patient referred to study by MD and patient confirms that Dr. [name] provided patient with a brief explanation of study and patient expressed interest in participation.  I spoke with patient about the study as well and informed him of the study process of initial screening for the KEAP1/NRF2 for study eligibility and patient gave his verbal understanding. Patient was informed should he meet full eligibility and blood work resulted with presence of KEAP1/NRF2 markers, he would be eligible for study, should he wish to participate. Today, patient and I reviewed the study consent form for the Next Generation Sequencing (NGS)/Genetic Test, page by page. Once the consent form, [employer] IRB Approved Version 01 Sep 2018 Version 2.0, was reviewed in its entirety and all his questions answered, patient proceeded to sign and date where indicated. Patient was then taken to lab for blood draw for the NGS/Genetic test. Patient informed that  Dr. [name] will inform him of the sample results. Patient was provided with a copy of the signed consent form for his record. Patient was thanked for his time and interest in study and was encouraged to call Dr. [name] or myself with any questions or concerns he may have. Patient was provided with my direct contact information.  Patient meets baseline inclusion/exclusion criteria, blood sample drawn today, sent to [facility] for testing. [name], RN, BSN, [facility] 06/28/2019 1:23 PM   NGS test results received today from study. Study report resulted with no detection of the Eligible KEAP1 or NRF2 Mutation, as well as no detection of the STK11/LKB1 Mutation for stratification, which makes patient ineligible to proceed with study. Dr. [name] aware of results and will discuss with patient.  [name], RN, BSN, [facility] Clinical Research 07/07/2019 12:34 PM

## 2019-06-29 ENCOUNTER — Encounter: Payer: Self-pay | Admitting: Internal Medicine

## 2019-06-30 ENCOUNTER — Telehealth: Payer: Self-pay | Admitting: Internal Medicine

## 2019-06-30 NOTE — Telephone Encounter (Signed)
Scheduled per los. Called and spoke with patient. Confirmed appt 

## 2019-07-01 ENCOUNTER — Encounter: Payer: Self-pay | Admitting: Internal Medicine

## 2019-07-02 ENCOUNTER — Telehealth: Payer: Self-pay | Admitting: Medical Oncology

## 2019-07-02 DIAGNOSIS — R220 Localized swelling, mass and lump, head: Secondary | ICD-10-CM

## 2019-07-02 MED ORDER — METHYLPREDNISOLONE 4 MG PO TBPK: ORAL_TABLET | ORAL | 0 refills | Status: DC

## 2019-07-02 NOTE — Telephone Encounter (Signed)
Upper Lip swelling-Pt started oxycodone wed at 8 pm .  thursday am with upper lip swelling. Took benadryl last night - a little bit better.  Denies SOB, or any other symptoms. He thought it might be from a front tooth he needs fixed.  Per Dr [name] medrol dose pak ordered and tp instructed to go to ED for worsening symptoms or new symptoms of trouble breathing, increased itching, chest pain or rash. Also instructed to get tooth fixed. Pt notified.

## 2019-07-07 ENCOUNTER — Inpatient Hospital Stay: Payer: Medicare Other

## 2019-07-07 ENCOUNTER — Encounter: Payer: Self-pay | Admitting: Internal Medicine

## 2019-07-07 ENCOUNTER — Other Ambulatory Visit: Payer: Self-pay

## 2019-07-07 ENCOUNTER — Encounter: Payer: Self-pay | Admitting: *Deleted

## 2019-07-07 ENCOUNTER — Inpatient Hospital Stay (HOSPITAL_BASED_OUTPATIENT_CLINIC_OR_DEPARTMENT_OTHER): Payer: Medicare Other | Admitting: Internal Medicine

## 2019-07-07 VITALS — BP 145/68 | HR 94 | Temp 97.3°F | Resp 17 | Ht 75.0 in | Wt 209.1 lb

## 2019-07-07 DIAGNOSIS — Z5111 Encounter for antineoplastic chemotherapy: Secondary | ICD-10-CM | POA: Insufficient documentation

## 2019-07-07 DIAGNOSIS — R5382 Chronic fatigue, unspecified: Secondary | ICD-10-CM | POA: Diagnosis not present

## 2019-07-07 DIAGNOSIS — C3491 Malignant neoplasm of unspecified part of right bronchus or lung: Secondary | ICD-10-CM | POA: Diagnosis not present

## 2019-07-07 DIAGNOSIS — I2510 Atherosclerotic heart disease of native coronary artery without angina pectoris: Secondary | ICD-10-CM | POA: Diagnosis not present

## 2019-07-07 DIAGNOSIS — Z7189 Other specified counseling: Secondary | ICD-10-CM | POA: Diagnosis not present

## 2019-07-07 DIAGNOSIS — Z5112 Encounter for antineoplastic immunotherapy: Secondary | ICD-10-CM | POA: Insufficient documentation

## 2019-07-07 DIAGNOSIS — C3490 Malignant neoplasm of unspecified part of unspecified bronchus or lung: Secondary | ICD-10-CM

## 2019-07-07 LAB — CMP (CANCER CENTER ONLY)
ALT: 18 U/L (ref 0–44)
AST: 16 U/L (ref 15–41)
Albumin: 4.2 g/dL (ref 3.5–5.0)
Alkaline Phosphatase: 67 U/L (ref 38–126)
Anion gap: 11 (ref 5–15)
BUN: 17 mg/dL (ref 8–23)
CO2: 27 mmol/L (ref 22–32)
Calcium: 9.7 mg/dL (ref 8.9–10.3)
Chloride: 102 mmol/L (ref 98–111)
Creatinine: 1.12 mg/dL (ref 0.61–1.24)
GFR, Est AFR Am: >60 mL/min (ref >60)
GFR, Estimated: >60 mL/min (ref >60)
Glucose, Bld: 179 mg/dL — ABNORMAL HIGH (ref 70–99)
Potassium: 4.7 mmol/L (ref 3.5–5.1)
Sodium: 140 mmol/L (ref 135–145)
Total Bilirubin: 0.3 mg/dL (ref 0.3–1.2)
Total Protein: 8.7 g/dL — ABNORMAL HIGH (ref 6.5–8.1)

## 2019-07-07 LAB — CBC WITH DIFFERENTIAL (CANCER CENTER ONLY)
Abs Immature Granulocytes: 0.03 10*3/uL (ref 0.00–0.07)
Basophils Absolute: 0.1 10*3/uL (ref 0.0–0.1)
Basophils Relative: 1 %
Eosinophils Absolute: 0.6 10*3/uL — ABNORMAL HIGH (ref 0.0–0.5)
Eosinophils Relative: 5 %
HCT: 41.4 % (ref 39.0–52.0)
Hemoglobin: 13.2 g/dL (ref 13.0–17.0)
Immature Granulocytes: 0 %
Lymphocytes Relative: 40 %
Lymphs Abs: 4.5 10*3/uL — ABNORMAL HIGH (ref 0.7–4.0)
MCH: 27.8 pg (ref 26.0–34.0)
MCHC: 31.9 g/dL (ref 30.0–36.0)
MCV: 87.2 fL (ref 80.0–100.0)
Monocytes Absolute: 0.9 10*3/uL (ref 0.1–1.0)
Monocytes Relative: 8 %
Neutro Abs: 5.1 10*3/uL (ref 1.7–7.7)
Neutrophils Relative %: 46 %
Platelet Count: 427 10*3/uL — ABNORMAL HIGH (ref 150–400)
RBC: 4.75 MIL/uL (ref 4.22–5.81)
RDW: 13.2 % (ref 11.5–15.5)
WBC Count: 11.2 10*3/uL — ABNORMAL HIGH (ref 4.0–10.5)
nRBC: 0 % (ref 0.0–0.2)

## 2019-07-07 MED ORDER — LIDOCAINE-PRILOCAINE 2.5-2.5 % EX CREA: TOPICAL_CREAM | CUTANEOUS | 0 refills | Status: DC

## 2019-07-07 MED ORDER — CYANOCOBALAMIN 1000 MCG/ML IJ SOLN
1000.0000 ug | Freq: Once | INTRAMUSCULAR | Status: AC
  Administered 2019-07-07: 1000 ug via INTRAMUSCULAR

## 2019-07-07 MED ORDER — PROCHLORPERAZINE MALEATE 10 MG PO TABS: 10.0000 mg | ORAL_TABLET | Freq: Four times a day (QID) | ORAL | 0 refills | Status: DC | PRN

## 2019-07-07 MED ORDER — FOLIC ACID 1 MG PO TABS: 1.0000 mg | ORAL_TABLET | Freq: Every day | ORAL | 4 refills | Status: DC

## 2019-07-07 MED ORDER — CYANOCOBALAMIN 1000 MCG/ML IJ SOLN
INTRAMUSCULAR | Status: AC
  Filled 2019-07-07: qty 1

## 2019-07-07 NOTE — Research (Signed)
Dorena Bodo [id number]  07/07/19 10:07 AM  Patient [name] previously signed NGS/Genetic test for the [facility] trial.  Inadvertently, patient and research RN dated with year 2020 instead of 2021.  This RN met with patient and explained error.  Patient corrected date to "78," and dated and initialed the change.  Otilio Miu, Research RN also corrected date today, prior to patient appointment (she was not able to be present at appointment with patient).  Rubin Payor had signed as witness at the time of the initial consent conversation.  Patient was thanked for his understanding.  Patient was provided with a copy of the corrected consent form.  Informed patient that Dr. Earlie Server receives full test results.  Patient denied any questions or concerns.  Doreatha Martin, RN, BSN, [facility] 07/07/2019 10:11 AM

## 2019-07-07 NOTE — Progress Notes (Signed)
START ON PATHWAY REGIMEN - Non-Small Cell Lung     A cycle is every 21 days:     Pembrolizumab      Pemetrexed      Carboplatin   **Always confirm dose/schedule in your pharmacy ordering system**  Patient Characteristics: Stage IV Metastatic, Nonsquamous, Initial Chemotherapy/Immunotherapy, PS = 0, 1, ALK Rearrangement Negative and ROS1 Rearrangement Negative and NTRK Gene Fusion?Negative and RET Gene Fusion?Negative and EGFR Mutation Negative/Non?Sensitizing, PD-L1  Expression Positive 1-49% (TPS) / Negative / Not Tested / Awaiting Test Results and Immunotherapy Candidate AJCC T Category: TX Current Disease Status: Distant Metastases AJCC N Category: N3 AJCC M Category: M1c AJCC 8 Stage Grouping: IVB Histology: Nonsquamous Cell ROS1 Rearrangement Status: Negative T790M Mutation Status: Not Applicable - EGFR Mutation Negative/Unknown Other Mutations/Biomarkers: No Other Actionable Mutations Chemotherapy/Immunotherapy LOT: Initial Chemotherapy/Immunotherapy Molecular Targeted Therapy: Not Appropriate MET Exon 14 Mutation Status: Negative RET Gene Fusion Status: Negative EGFR Mutation Status: Negative/Wild Type NTRK Gene Fusion Status: Negative PD-L1 Expression Status: Quantity Not Sufficient ALK Rearrangement Status: Negative BRAF V600E Mutation Status: Negative ECOG Performance Status: 1 Biomarker Assessment Status Confirmation: All Genomic Markers Negative or Only MET+ or BRAF+ Immunotherapy Candidate Status: Candidate for Immunotherapy Intent of Therapy: Non-Curative / Palliative Intent, Discussed with Patient

## 2019-07-07 NOTE — Progress Notes (Signed)
[facility] [phone]   [phone]  OFFICE PROGRESS NOTE  [name], FNP [name] [id number]  DIAGNOSIS: Stage IV (TX, N3, M1c) non-small cell lung cancer, poorly differentiated adenocarcinoma presented with bulky mediastinal lymphadenopathy in addition to right axillary lymphadenopathy and metastatic liver lesions, abdominal lymphadenopathy diagnosed in January 2021. Molecular studies by [employer] that shows no actionable mutations.  PRIOR THERAPY: None  CURRENT THERAPY: Systemic chemotherapy with carboplatin for AUC of 5, Alimta 500 mg/M2 and Keytruda 200 mg IV every 3 weeks.  First dose July 14, 2019.  INTERVAL HISTORY: [name] 64 y.o. male returns to the clinic today for follow-up visit.  The patient is feeling fine today with no concerning complaints except for mild fatigue.  He denied having any current chest pain but has shortness of breath with exertion with no cough or hemoptysis.  He denied having any fever or chills.  He has no nausea, vomiting, diarrhea or constipation.  He he mentioned that he has intermittent headache but no visual changes.  He had molecular studies performed by [employer] and that showed no actionable mutations.  The patient is here today for evaluation and discussion of his treatment options.  MEDICAL HISTORY: Past Medical History:  Diagnosis Date  . Arthritis    "right knee" (09/04/2015)  . Atrial flutter with rapid ventricular response ([place]) 04/19/2019  . Dyspnea    increased exertion  . HTN (hypertension)   . Hyperlipidemia   . NSTEMI (non-ST elevated myocardial infarction) ([place]) 09/04/2015   a. cath 09/05/2015: 95% stenosis mid-LAD, 55% mid-RCA, and 40% prox-RCA. PCI performed w/ Synergy DES to LAD  . PAD (peripheral artery disease) ([facility])    Stenting of bilateral iliacs in 2003.  [name] Refusal of blood transfusions as patient is Jehovah's Witness   . Type II diabetes  mellitus ([facility])     ALLERGIES:  has No Known Allergies.  MEDICATIONS:  Current Outpatient Medications  Medication Sig Dispense Refill  . acetaminophen (TYLENOL) 500 MG tablet Take 500 mg by mouth every 6 (six) hours as needed for fever.    [name] apixaban (ELIQUIS) 5 MG TABS tablet Take 1 tablet (5 mg total) by mouth 2 (two) times daily. 60 tablet 1  . aspirin EC 81 MG tablet Take 81 mg by mouth at bedtime.    . carvedilol (COREG) 12.5 MG tablet Take 1 tablet (12.5 mg total) by mouth 2 (two) times daily with a meal. 180 tablet 3  . cetirizine (ZYRTEC) 10 MG tablet Take 1 tablet (10 mg total) by mouth daily. 30 tablet 11  . clopidogrel (PLAVIX) 75 MG tablet Take 75 mg by mouth daily.    [name] glimepiride (AMARYL) 4 MG tablet Take 1 tablet (4 mg total) by mouth daily. 90 tablet 1  . HYDROcodone-homatropine (HYCODAN) 5-1.5 MG/5ML syrup Take 5 mLs by mouth every 8 (eight) hours as needed for cough. 120 mL 0  . Hydrocortisone Acetate 1 % CREA Apply 15 g topically 2 (two) times daily. 15 g 3  . ibuprofen (ADVIL) 200 MG tablet Take 600 mg by mouth once.    [name] ipratropium (ATROVENT) 0.03 % nasal spray USE 2 SPRAYS IN EACH NOSTRIL TWICE DAILY 30 mL 2  . lisinopril (ZESTRIL) 10 MG tablet Take 1 tablet (10 mg total) by mouth daily. 90 tablet 1  . metFORMIN (GLUCOPHAGE) 1000 MG tablet Take 1 tablet (1,000 mg total) by mouth 2 (two) times  daily with a meal. 180 tablet 1  . methylPREDNISolone (MEDROL) 4 MG TBPK tablet Take per package directions. 21 tablet 0  . multivitamin (ONE-A-DAY MEN'S) TABS tablet Take 1 tablet by mouth daily. 30 tablet 6  . nitroGLYCERIN (NITROSTAT) 0.4 MG SL tablet Place 1 tablet (0.4 mg total) under the tongue every 5 (five) minutes x 3 doses as needed for chest pain. 25 tablet 2  . oxyCODONE-acetaminophen (PERCOCET/ROXICET) 5-325 MG tablet Take 1 tablet by mouth every 6 (six) hours as needed for severe pain. 30 tablet 0  . pantoprazole (PROTONIX) 40 MG tablet TAKE 1 TABLET BY MOUTH EVERY  DAY 30 tablet 5  . rosuvastatin (CRESTOR) 40 MG tablet TAKE 1 TABLET BY MOUTH DAILY 90 tablet 0  . sucralfate (CARAFATE) 1 GM/10ML suspension Take 10 mLs (1 g total) by mouth 4 (four) times daily. 420 mL 1   No current facility-administered medications for this visit.    SURGICAL HISTORY:  Past Surgical History:  Procedure Laterality Date  . CARDIAC CATHETERIZATION N/A 09/05/2015   Procedure: Left Heart Cath and Coronary Angiography;  Surgeon: Troy Sine, MD;  Location: [facility];  Service: Cardiovascular;  Laterality: N/A;  . CARDIAC CATHETERIZATION N/A 09/05/2015   Procedure: Coronary Stent Intervention;  Surgeon: Troy Sine, MD;  Location: [facility];  Service: Cardiovascular;  Laterality: N/A;  . 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 PERIPHERAL VASCULAR CATHETERIZATION Bilateral 2003   "stenting"  . TONSILLECTOMY  ~ 1963    REVIEW OF SYSTEMS:  Constitutional: positive for fatigue Eyes: negative Ears, nose, mouth, throat, and face: negative Respiratory: positive for dyspnea on exertion Cardiovascular: negative Gastrointestinal: negative Genitourinary:negative Integument/breast: negative Hematologic/lymphatic: negative Musculoskeletal:negative Neurological: positive for headaches Behavioral/Psych: negative Endocrine: negative Allergic/Immunologic: negative   PHYSICAL EXAMINATION: General appearance: alert, cooperative, appears stated age and fatigued Head: Normocephalic, without obvious abnormality, atraumatic Neck: no adenopathy, no JVD, supple, symmetrical, trachea midline and thyroid not enlarged, symmetric, no tenderness/mass/nodules Lymph nodes: Cervical, supraclavicular, and axillary nodes normal. Resp: clear to auscultation bilaterally Back: symmetric, no curvature. ROM normal. No CVA tenderness. Cardio: regular rate and rhythm, S1, S2 normal, no murmur, click, rub or gallop GI: soft, non-tender; bowel sounds  normal; no masses,  no organomegaly Extremities: extremities normal, atraumatic, no cyanosis or edema Neurologic: Alert and oriented X 3, normal strength and tone. Normal symmetric reflexes. Normal coordination and gait  ECOG PERFORMANCE STATUS: 1 - Symptomatic but completely ambulatory  Blood pressure (!) 145/68, pulse 94, temperature (!) 97.3 F (36.3 C), temperature source Temporal, resp. rate 17, height 6\' 3"  (1.905 m), weight 209 lb 1.6 oz (94.8 kg), SpO2 100 %.  LABORATORY DATA: Lab Results  Component Value Date   WBC 11.2 (H) 07/07/2019   HGB 13.2 07/07/2019   HCT 41.4 07/07/2019   MCV 87.2 07/07/2019   PLT 427 (H) 07/07/2019      Chemistry      Component Value Date/Time   NA 140 06/28/2019 0936   NA 140 06/08/2015 0840   K 4.2 06/28/2019 0936   CL 102 06/28/2019 0936   CO2 27 06/28/2019 0936   BUN 12 06/28/2019 0936   BUN 15 06/08/2015 0840   CREATININE 1.09 06/28/2019 0936   CREATININE 1.06 10/01/2016 0855      Component Value Date/Time   CALCIUM 9.7 06/28/2019 0936   ALKPHOS 73 06/28/2019 0936   AST 15 06/28/2019 0936   ALT 15 06/28/2019 0936  BILITOT 0.5 06/28/2019 0936       RADIOGRAPHIC STUDIES: [place] CORE BIOPSY (LYMPH NODES)  Result Date: 06/24/2019 INDICATION: 64 year old male with suspected metastatic disease of uncertain primary etiology. Small cell lung cancer is suspected. Patient presents for ultrasound-guided core biopsy of supraclavicular lymph node. EXAM: ULTRASOUND OF THE LYMPH NODES MEDICATIONS: None. ANESTHESIA/SEDATION: Moderate (conscious) sedation was employed during this procedure. A total of Versed to mg and Fentanyl 100 mcg was administered intravenously. Moderate Sedation Time: 14 minutes. The patient's level of consciousness and vital signs were monitored continuously by radiology nursing throughout the procedure under my direct supervision. FLUOROSCOPY TIME:  None. COMPLICATIONS: None immediate. PROCEDURE: Informed written consent was  obtained from the patient after a thorough discussion of the procedural risks, benefits and alternatives. All questions were addressed. Maximal Sterile Barrier Technique was utilized including caps, mask, sterile gowns, sterile gloves, sterile drape, hand hygiene and skin antiseptic. A timeout was performed prior to the initiation of the procedure. Ultrasound was used to interrogate the left supraclavicular fossa. A large lobulated lymph node measuring 3.2 x 1.4 cm is successfully identified. This lymph node is both enlarged and morphologically abnormal. The overlying skin was sterilely prepped and draped in the standard fashion using chlorhexidine skin prep. Local anesthesia was attained by infiltration with 1% lidocaine. A small dermatotomy was made. Under real-time ultrasound guidance, multiple 18 gauge core biopsies were obtained using the [employer] mission automated biopsy device. Biopsy specimens were placed in formalin and delivered to pathology for further analysis. Post biopsy ultrasound imaging demonstrates no evidence of hemorrhage or other complication. The patient tolerated the procedure well. IMPRESSION: Successful ultrasound-guided core biopsy of left supraclavicular lymphadenopathy. Electronically Signed   By: Jacqulynn Cadet M.D.   On: 06/24/2019 18:55    ASSESSMENT AND PLAN: This is a very pleasant 64 years old African-American male recently diagnosed with a stage IV (TX, N3, M1c) non-small cell lung cancer, poorly differentiated adenocarcinoma presented with bulky mediastinal as well as right hilar, supraclavicular, abdominal and right axillary lymphadenopathy in addition to metastatic liver lesions diagnosed in January 2021. Molecular studies by [employer] shows no actionable mutations.  The patient is not a candidate for treatment with targeted therapy or enrollment in the clinical trial with the [facility] regimen. I had a lengthy discussion with the patient today about his current condition  and treatment options.  I recommended for the patient treatment with systemic chemotherapy with carboplatin for AUC of 5, Alimta 500 mg/M2 and Keytruda 200 mg IV every 3 weeks.  He was previously given the option of palliative care but he is interested in treatment. I discussed with the patient the adverse effect of this treatment including but not limited to alopecia, myelosuppression, nausea and vomiting, peripheral neuropathy, liver or renal dysfunction in addition to the immunotherapy adverse effects. He is expected to start the first cycle of this treatment on July 14, 2019. I will arrange for the patient to have a chemotherapy education class before the first dose of his treatment. He will receive vitamin B12 injection today. I will send prescription to his pharmacy including Compazine 10 mg p.o. every 6 hours as needed for nausea, folic acid 1 mg p.o. daily in addition to EMLA cream to be applied to the Port-A-Cath site before treatment. I will also arrange for the patient to have MRI of the brain early next week. I will refer the patient to interventional radiology for Port-A-Cath placement before his first treatment. For pain management, I will start the  patient on Percocet 5/325 mg p.o. every 6 hours as needed. For constipation he was advised to take stool softener and also MiraLAX if needed. The patient will come back for follow-up visit in 2 weeks for evaluation and management of any adverse effect of his treatment. He was advised to call immediately if he has any concerning symptoms in the interval. The patient voices understanding of current disease status and treatment options and is in agreement with the current care plan.  All questions were answered. The patient knows to call the clinic with any problems, questions or concerns. We can certainly see the patient much sooner if necessary.  Disclaimer: This note was dictated with voice recognition software. Similar sounding words  can inadvertently be transcribed and may not be corrected upon review.

## 2019-07-07 NOTE — Patient Instructions (Signed)
Pembrolizumab injection What is this medicine? PEMBROLIZUMAB (pem broe liz ue mab) is a monoclonal antibody. It is used to treat certain types of cancer. This medicine may be used for other purposes; ask your health care provider or pharmacist if you have questions. COMMON BRAND NAME(S): Keytruda What should I tell my health care provider before I take this medicine? They need to know if you have any of these conditions:  diabetes  immune system problems  inflammatory bowel disease  liver disease  lung or breathing disease  lupus  received or scheduled to receive an organ transplant or a stem-cell transplant that uses donor stem cells  an unusual or allergic reaction to pembrolizumab, other medicines, foods, dyes, or preservatives  pregnant or trying to get pregnant  breast-feeding How should I use this medicine? This medicine is for infusion into a vein. It is given by a health care professional in a hospital or clinic setting. A special MedGuide will be given to you before each treatment. Be sure to read this information carefully each time. Talk to your pediatrician regarding the use of this medicine in children. While this drug may be prescribed for children as young as 6 months for selected conditions, precautions do apply. Overdosage: If you think you have taken too much of this medicine contact a poison control center or emergency room at once. NOTE: This medicine is only for you. Do not share this medicine with others. What if I miss a dose? It is important not to miss your dose. Call your doctor or health care professional if you are unable to keep an appointment. What may interact with this medicine? Interactions have not been studied. Give your health care provider a list of all the medicines, herbs, non-prescription drugs, or dietary supplements you use. Also tell them if you smoke, drink alcohol, or use illegal drugs. Some items may interact with your medicine. This  list may not describe all possible interactions. Give your health care provider a list of all the medicines, herbs, non-prescription drugs, or dietary supplements you use. Also tell them if you smoke, drink alcohol, or use illegal drugs. Some items may interact with your medicine. What should I watch for while using this medicine? Your condition will be monitored carefully while you are receiving this medicine. You may need blood work done while you are taking this medicine. Do not become pregnant while taking this medicine or for 4 months after stopping it. Women should inform their doctor if they wish to become pregnant or think they might be pregnant. There is a potential for serious side effects to an unborn child. Talk to your health care professional or pharmacist for more information. Do not breast-feed an infant while taking this medicine or for 4 months after the last dose. What side effects may I notice from receiving this medicine? Side effects that you should report to your doctor or health care professional as soon as possible:  allergic reactions like skin rash, itching or hives, swelling of the face, lips, or tongue  bloody or black, tarry  breathing problems  changes in vision  chest pain  chills  confusion  constipation  cough  diarrhea  dizziness or feeling faint or lightheaded  fast or irregular heartbeat  fever  flushing  joint pain  low blood counts - this medicine may decrease the number of white blood cells, red blood cells and platelets. You may be at increased risk for infections and bleeding.  muscle pain  muscle  weakness  pain, tingling, numbness in the hands or feet  persistent headache  redness, blistering, peeling or loosening of the skin, including inside the mouth  signs and symptoms of high blood sugar such as dizziness; dry mouth; dry skin; fruity breath; nausea; stomach pain; increased hunger or thirst; increased urination  signs  and symptoms of kidney injury like trouble passing urine or change in the amount of urine  signs and symptoms of liver injury like dark urine, light-colored stools, loss of appetite, nausea, right upper belly pain, yellowing of the eyes or skin  sweating  swollen lymph nodes  weight loss Side effects that usually do not require medical attention (report to your doctor or health care professional if they continue or are bothersome):  decreased appetite  hair loss  muscle pain  tiredness This list may not describe all possible side effects. Call your doctor for medical advice about side effects. You may report side effects to FDA at 1-800-FDA-1088. Where should I keep my medicine? This drug is given in a hospital or clinic and will not be stored at home. NOTE: This sheet is a summary. It may not cover all possible information. If you have questions about this medicine, talk to your doctor, pharmacist, or health care provider.  2020 Elsevier/Gold Standard (2019-03-19 18:07:58) Pemetrexed injection What is this medicine? PEMETREXED (PEM e TREX ed) is a chemotherapy drug used to treat lung cancers like non-small cell lung cancer and mesothelioma. It may also be used to treat other cancers. This medicine may be used for other purposes; ask your health care provider or pharmacist if you have questions. COMMON BRAND NAME(S): Alimta What should I tell my health care provider before I take this medicine? They need to know if you have any of these conditions:  infection (especially a virus infection such as chickenpox, cold sores, or herpes)  kidney disease  low blood counts, like low white cell, platelet, or red cell counts  lung or breathing disease, like asthma  radiation therapy  an unusual or allergic reaction to pemetrexed, other medicines, foods, dyes, or preservative  pregnant or trying to get pregnant  breast-feeding How should I use this medicine? This drug is given as  an infusion into a vein. It is administered in a hospital or clinic by a specially trained health care professional. Talk to your pediatrician regarding the use of this medicine in children. Special care may be needed. Overdosage: If you think you have taken too much of this medicine contact a poison control center or emergency room at once. NOTE: This medicine is only for you. Do not share this medicine with others. What if I miss a dose? It is important not to miss your dose. Call your doctor or health care professional if you are unable to keep an appointment. What may interact with this medicine? This medicine may interact with the following medications:  Ibuprofen This list may not describe all possible interactions. Give your health care provider a list of all the medicines, herbs, non-prescription drugs, or dietary supplements you use. Also tell them if you smoke, drink alcohol, or use illegal drugs. Some items may interact with your medicine. What should I watch for while using this medicine? Visit your doctor for checks on your progress. This drug may make you feel generally unwell. This is not uncommon, as chemotherapy can affect healthy cells as well as cancer cells. Report any side effects. Continue your course of treatment even though you feel ill unless  your doctor tells you to stop. In some cases, you may be given additional medicines to help with side effects. Follow all directions for their use. Call your doctor or health care professional for advice if you get a fever, chills or sore throat, or other symptoms of a cold or flu. Do not treat yourself. This drug decreases your body's ability to fight infections. Try to avoid being around people who are sick. This medicine may increase your risk to bruise or bleed. Call your doctor or health care professional if you notice any unusual bleeding. Be careful brushing and flossing your teeth or using a toothpick because you may get an  infection or bleed more easily. If you have any dental work done, tell your dentist you are receiving this medicine. Avoid taking products that contain aspirin, acetaminophen, ibuprofen, naproxen, or ketoprofen unless instructed by your doctor. These medicines may hide a fever. Call your doctor or health care professional if you get diarrhea or mouth sores. Do not treat yourself. To protect your kidneys, drink water or other fluids as directed while you are taking this medicine. Do not become pregnant while taking this medicine or for 6 months after stopping it. Women should inform their doctor if they wish to become pregnant or think they might be pregnant. Men should not father a child while taking this medicine and for 3 months after stopping it. This may interfere with the ability to father a child. You should talk to your doctor or health care professional if you are concerned about your fertility. There is a potential for serious side effects to an unborn child. Talk to your health care professional or pharmacist for more information. Do not breast-feed an infant while taking this medicine or for 1 week after stopping it. What side effects may I notice from receiving this medicine? Side effects that you should report to your doctor or health care professional as soon as possible:  allergic reactions like skin rash, itching or hives, swelling of the face, lips, or tongue  breathing problems  redness, blistering, peeling or loosening of the skin, including inside the mouth  signs and symptoms of bleeding such as bloody or black, tarry stools; red or dark-brown urine; spitting up blood or brown material that looks like coffee grounds; red spots on the skin; unusual bruising or bleeding from the eye, gums, or nose  signs and symptoms of infection like fever or chills; cough; sore throat; pain or trouble passing urine  signs and symptoms of kidney injury like trouble passing urine or change in the  amount of urine  signs and symptoms of liver injury like dark yellow or brown urine; general ill feeling or flu-like symptoms; light-colored stools; loss of appetite; nausea; right upper belly pain; unusually weak or tired; yellowing of the eyes or skin Side effects that usually do not require medical attention (report to your doctor or health care professional if they continue or are bothersome):  constipation  mouth sores  nausea, vomiting  unusually weak or tired This list may not describe all possible side effects. Call your doctor for medical advice about side effects. You may report side effects to FDA at 1-800-FDA-1088. Where should I keep my medicine? This drug is given in a hospital or clinic and will not be stored at home. NOTE: This sheet is a summary. It may not cover all possible information. If you have questions about this medicine, talk to your doctor, pharmacist, or health care provider.  2020  Elsevier/Gold Standard (2017-07-02 16:11:33) Carboplatin injection What is this medicine? CARBOPLATIN (KAR boe pla tin) is a chemotherapy drug. It targets fast dividing cells, like cancer cells, and causes these cells to die. This medicine is used to treat ovarian cancer and many other cancers. This medicine may be used for other purposes; ask your health care provider or pharmacist if you have questions. COMMON BRAND NAME(S): Paraplatin What should I tell my health care provider before I take this medicine? They need to know if you have any of these conditions:  blood disorders  hearing problems  kidney disease  recent or ongoing radiation therapy  an unusual or allergic reaction to carboplatin, cisplatin, other chemotherapy, other medicines, foods, dyes, or preservatives  pregnant or trying to get pregnant  breast-feeding How should I use this medicine? This drug is usually given as an infusion into a vein. It is administered in a hospital or clinic by a specially  trained health care professional. Talk to your pediatrician regarding the use of this medicine in children. Special care may be needed. Overdosage: If you think you have taken too much of this medicine contact a poison control center or emergency room at once. NOTE: This medicine is only for you. Do not share this medicine with others. What if I miss a dose? It is important not to miss a dose. Call your doctor or health care professional if you are unable to keep an appointment. What may interact with this medicine?  medicines for seizures  medicines to increase blood counts like filgrastim, pegfilgrastim, sargramostim  some antibiotics like amikacin, gentamicin, neomycin, streptomycin, tobramycin  vaccines Talk to your doctor or health care professional before taking any of these medicines:  acetaminophen  aspirin  ibuprofen  ketoprofen  naproxen This list may not describe all possible interactions. Give your health care provider a list of all the medicines, herbs, non-prescription drugs, or dietary supplements you use. Also tell them if you smoke, drink alcohol, or use illegal drugs. Some items may interact with your medicine. What should I watch for while using this medicine? Your condition will be monitored carefully while you are receiving this medicine. You will need important blood work done while you are taking this medicine. This drug may make you feel generally unwell. This is not uncommon, as chemotherapy can affect healthy cells as well as cancer cells. Report any side effects. Continue your course of treatment even though you feel ill unless your doctor tells you to stop. In some cases, you may be given additional medicines to help with side effects. Follow all directions for their use. Call your doctor or health care professional for advice if you get a fever, chills or sore throat, or other symptoms of a cold or flu. Do not treat yourself. This drug decreases your body's  ability to fight infections. Try to avoid being around people who are sick. This medicine may increase your risk to bruise or bleed. Call your doctor or health care professional if you notice any unusual bleeding. Be careful brushing and flossing your teeth or using a toothpick because you may get an infection or bleed more easily. If you have any dental work done, tell your dentist you are receiving this medicine. Avoid taking products that contain aspirin, acetaminophen, ibuprofen, naproxen, or ketoprofen unless instructed by your doctor. These medicines may hide a fever. Do not become pregnant while taking this medicine. Women should inform their doctor if they wish to become pregnant or think they might  be pregnant. There is a potential for serious side effects to an unborn child. Talk to your health care professional or pharmacist for more information. Do not breast-feed an infant while taking this medicine. What side effects may I notice from receiving this medicine? Side effects that you should report to your doctor or health care professional as soon as possible:  allergic reactions like skin rash, itching or hives, swelling of the face, lips, or tongue  signs of infection - fever or chills, cough, sore throat, pain or difficulty passing urine  signs of decreased platelets or bleeding - bruising, pinpoint red spots on the skin, black, tarry stools, nosebleeds  signs of decreased red blood cells - unusually weak or tired, fainting spells, lightheadedness  breathing problems  changes in hearing  changes in vision  chest pain  high blood pressure  low blood counts - This drug may decrease the number of white blood cells, red blood cells and platelets. You may be at increased risk for infections and bleeding.  nausea and vomiting  pain, swelling, redness or irritation at the injection site  pain, tingling, numbness in the hands or feet  problems with balance, talking,  walking  trouble passing urine or change in the amount of urine Side effects that usually do not require medical attention (report to your doctor or health care professional if they continue or are bothersome):  hair loss  loss of appetite  metallic taste in the mouth or changes in taste This list may not describe all possible side effects. Call your doctor for medical advice about side effects. You may report side effects to FDA at 1-800-FDA-1088. Where should I keep my medicine? This drug is given in a hospital or clinic and will not be stored at home. NOTE: This sheet is a summary. It may not cover all possible information. If you have questions about this medicine, talk to your doctor, pharmacist, or health care provider.  2020 Elsevier/Gold Standard (2007-08-18 14:38:05) Lung Cancer Lung cancer is an abnormal growth of cancerous cells that forms a mass (malignant tumor) in a lung. There are several types of lung cancer. The types are based on the appearance of the tumor cells. The two most common types are:  Non-small cell lung cancer. This type of lung cancer is the most common type. Non-small cell lung cancers include squamous cell carcinoma, adenocarcinoma, and large cell carcinoma.  Small cell lung cancer. In this type of lung cancer, abnormal cells are smaller than those of non-small cell lung cancer. Small cell lung cancer gets worse (progresses) faster than non-small cell lung cancer. What are the causes? The most common cause of lung cancer is smoking tobacco. The second most common cause is exposure to a chemical called radon. What increases the risk? You are more likely to develop this condition if:  You smoke tobacco.  You have been exposed to: ? Secondhand tobacco smoke. ? Radon gas. ? Uranium. ? Asbestos. ? Arsenic in drinking water. ? Air pollution.  You have a family or personal history of lung cancer.  You have had lung radiation therapy in the past.  You  are older than age 36. What are the signs or symptoms? In the early stages, you may not have any symptoms. As the cancer progresses, symptoms may include:  A lasting cough, possibly with blood.  Fatigue.  Unexplained weight loss.  Shortness of breath.  Loud breathing (wheezing).  Chest pain.  Loss of appetite. Symptoms of advanced lung cancer  include:  Hoarseness.  Bone or joint pain.  Weakness.  Change in the structure of the fingernails (clubbing), so that the nail looks like an upside-down spoon.  Swelling of the face or arms.  Inability to move the face (paralysis).  Drooping eyelids. How is this diagnosed? This condition may be diagnosed based on:  Your symptoms and medical history.  A physical exam.  A chest X-ray.  A CT scan.  Blood tests.  Sputum tests.  Removal of a sample of lung tissue (lung biopsy) for testing. Your cancer will be assessed (staged) to determine how severe it is and how much it has spread (metastasized). How is this treated? Treatment depends on the type and stage of your cancer. Treatment may include one or more of the following:  Surgery to remove as much of the cancer as possible. Lymph nodes in the area may be removed and tested for cancer as well.  Medicines that kill cancer cells (chemotherapy).  High-energy rays that kill cancer cells (radiation therapy).  Chemotherapy. This treatment uses medicines to destroy cancer cells.  Targeted therapy. This targets specific parts of cancer cells and the area around them to block the growth and spread of the cancer. Targeted therapy can help limit the damage to healthy cells. Follow these instructions at home: Eating and drinking  Some of your treatments might affect your appetite. If you are having problems eating, or if you do not have an appetite, meet with a dietitian.  If you have side effects that affect your appetite, it may help to: ? Eat smaller meals and snacks  often. ? Drink high-nutrition and high-calorie shakes or supplements. ? Eat bland and soft foods that are easy to eat. ? Avoid eating foods that are hot, spicy, or hard to swallow. General instructions   Do not use any products that contain nicotine or tobacco, such as cigarettes and e-cigarettes. If you need help quitting, ask your health care provider.  Do not drink alcohol.  If you are admitted to the hospital, make sure your cancer specialist (oncologist) is aware. Your cancer may affect your treatment for other conditions.  Take over-the-counter and prescription medicines only as told by your health care provider.  Consider joining a support group for people who have been diagnosed with lung cancer.  Work with your health care provider to manage any side effects of treatment.  Keep all follow-up visits as told by your health care provider. This is important. Where to find more information  American Cancer Society: https://www.cancer.[place]): https://www.cancer.gov Contact a health care provider if you:  Lose weight without trying.  Have a persistent cough and wheezing.  Feel short of breath.  Get tired easily.  Have bone or joint pain.  Have difficulty swallowing.  Notice that your voice is changing or getting hoarse.  Have pain that does not get better with medicine. Get help right away if you:  Cough up blood.  Have new breathing problems.  Have chest pain.  Have a fever.  Have swelling in an ankle, leg, or arm, or the face or neck.  Have paralysis in your face.  Are very confused.  Have a drooping eyelid. Summary  Lung cancer is an abnormal growth of cancerous cells that forms a mass (malignant tumor) in a lung.  There are several types of lung cancer. The types are based on the appearance of the tumor cells. The two most common types are non-small cell and small cell.  The most common cause of lung cancer is  smoking tobacco.  Early symptoms include a lasting cough, possibly with blood, and fatigue, unexplained weight loss, and shortness of breath.  After diagnosis, treatment depends on the type and stage of your cancer. This information is not intended to replace advice given to you by your health care provider. Make sure you discuss any questions you have with your health care provider. Document Revised: 04/25/2017 Document Reviewed: 03/20/2017 Elsevier Patient Education  2020 [employer].

## 2019-07-11 ENCOUNTER — Encounter: Payer: Self-pay | Admitting: Internal Medicine

## 2019-07-12 ENCOUNTER — Encounter: Payer: Self-pay | Admitting: Internal Medicine

## 2019-07-13 ENCOUNTER — Other Ambulatory Visit: Payer: Self-pay | Admitting: Radiology

## 2019-07-13 ENCOUNTER — Telehealth: Payer: Self-pay | Admitting: Medical Oncology

## 2019-07-13 NOTE — Telephone Encounter (Signed)
F/u call re: email pt sent today about his  coughing up blood .LVM to call back.

## 2019-07-14 ENCOUNTER — Inpatient Hospital Stay: Payer: Medicare Other

## 2019-07-14 ENCOUNTER — Other Ambulatory Visit: Payer: Self-pay

## 2019-07-14 VITALS — BP 151/66 | HR 78 | Temp 98.2°F | Resp 18

## 2019-07-14 DIAGNOSIS — R5382 Chronic fatigue, unspecified: Secondary | ICD-10-CM

## 2019-07-14 DIAGNOSIS — C3490 Malignant neoplasm of unspecified part of unspecified bronchus or lung: Secondary | ICD-10-CM

## 2019-07-14 DIAGNOSIS — Z5111 Encounter for antineoplastic chemotherapy: Secondary | ICD-10-CM | POA: Diagnosis not present

## 2019-07-14 LAB — CMP (CANCER CENTER ONLY)
ALT: 14 U/L (ref 0–44)
AST: 13 U/L — ABNORMAL LOW (ref 15–41)
Albumin: 4 g/dL (ref 3.5–5.0)
Alkaline Phosphatase: 69 U/L (ref 38–126)
Anion gap: 8 (ref 5–15)
BUN: 15 mg/dL (ref 8–23)
CO2: 27 mmol/L (ref 22–32)
Calcium: 9.7 mg/dL (ref 8.9–10.3)
Chloride: 101 mmol/L (ref 98–111)
Creatinine: 1.04 mg/dL (ref 0.61–1.24)
GFR, Est AFR Am: >60 mL/min (ref >60)
GFR, Estimated: >60 mL/min (ref >60)
Glucose, Bld: 170 mg/dL — ABNORMAL HIGH (ref 70–99)
Potassium: 4.9 mmol/L (ref 3.5–5.1)
Sodium: 136 mmol/L (ref 135–145)
Total Bilirubin: 0.3 mg/dL (ref 0.3–1.2)
Total Protein: 8.7 g/dL — ABNORMAL HIGH (ref 6.5–8.1)

## 2019-07-14 LAB — CBC WITH DIFFERENTIAL (CANCER CENTER ONLY)
Abs Immature Granulocytes: 0.01 10*3/uL (ref 0.00–0.07)
Basophils Absolute: 0.1 10*3/uL (ref 0.0–0.1)
Basophils Relative: 1 %
Eosinophils Absolute: 0.6 10*3/uL — ABNORMAL HIGH (ref 0.0–0.5)
Eosinophils Relative: 7 %
HCT: 37.6 % — ABNORMAL LOW (ref 39.0–52.0)
Hemoglobin: 12.1 g/dL — ABNORMAL LOW (ref 13.0–17.0)
Immature Granulocytes: 0 %
Lymphocytes Relative: 34 %
Lymphs Abs: 3 10*3/uL (ref 0.7–4.0)
MCH: 27.4 pg (ref 26.0–34.0)
MCHC: 32.2 g/dL (ref 30.0–36.0)
MCV: 85.3 fL (ref 80.0–100.0)
Monocytes Absolute: 0.6 10*3/uL (ref 0.1–1.0)
Monocytes Relative: 7 %
Neutro Abs: 4.5 10*3/uL (ref 1.7–7.7)
Neutrophils Relative %: 51 %
Platelet Count: 368 10*3/uL (ref 150–400)
RBC: 4.41 MIL/uL (ref 4.22–5.81)
RDW: 13 % (ref 11.5–15.5)
WBC Count: 8.7 10*3/uL (ref 4.0–10.5)
nRBC: 0 % (ref 0.0–0.2)

## 2019-07-14 LAB — TSH: TSH: 0.495 u[IU]/mL (ref 0.320–4.118)

## 2019-07-14 MED ORDER — SODIUM CHLORIDE 0.9 % IV SOLN
150.0000 mg | Freq: Once | INTRAVENOUS | Status: AC
  Administered 2019-07-14: 150 mg via INTRAVENOUS
  Filled 2019-07-14: qty 150

## 2019-07-14 MED ORDER — SODIUM CHLORIDE 0.9 % IV SOLN
566.0000 mg | Freq: Once | INTRAVENOUS | Status: AC
  Administered 2019-07-14: 570 mg via INTRAVENOUS
  Filled 2019-07-14: qty 57

## 2019-07-14 MED ORDER — PALONOSETRON HCL INJECTION 0.25 MG/5ML
INTRAVENOUS | Status: AC
  Filled 2019-07-14: qty 5

## 2019-07-14 MED ORDER — SODIUM CHLORIDE 0.9 % IV SOLN
200.0000 mg | Freq: Once | INTRAVENOUS | Status: AC
  Administered 2019-07-14: 200 mg via INTRAVENOUS
  Filled 2019-07-14: qty 8

## 2019-07-14 MED ORDER — DEXAMETHASONE SODIUM PHOSPHATE 10 MG/ML IJ SOLN
10.0000 mg | Freq: Once | INTRAMUSCULAR | Status: AC
  Administered 2019-07-14: 10 mg via INTRAVENOUS

## 2019-07-14 MED ORDER — DEXAMETHASONE SODIUM PHOSPHATE 10 MG/ML IJ SOLN
INTRAMUSCULAR | Status: AC
  Filled 2019-07-14: qty 1

## 2019-07-14 MED ORDER — SODIUM CHLORIDE 0.9 % IV SOLN
500.0000 mg/m2 | Freq: Once | INTRAVENOUS | Status: AC
  Administered 2019-07-14: 1100 mg via INTRAVENOUS
  Filled 2019-07-14: qty 40

## 2019-07-14 MED ORDER — PALONOSETRON HCL INJECTION 0.25 MG/5ML
0.2500 mg | Freq: Once | INTRAVENOUS | Status: AC
  Administered 2019-07-14: 0.25 mg via INTRAVENOUS

## 2019-07-14 MED ORDER — SODIUM CHLORIDE 0.9 % IV SOLN
Freq: Once | INTRAVENOUS | Status: AC
  Filled 2019-07-14: qty 250

## 2019-07-14 NOTE — Patient Instructions (Signed)
[facility] Discharge Instructions for Patients Receiving Chemotherapy  Today you received the following chemotherapy agents: pembrolizumab, pemetrexed, carboplatin.  To help prevent nausea and vomiting after your treatment, we encourage you to take your nausea medication as directed.   If you develop nausea and vomiting that is not controlled by your nausea medication, call the clinic.   BELOW ARE SYMPTOMS THAT SHOULD BE REPORTED IMMEDIATELY:  *FEVER GREATER THAN 100.5 F  *CHILLS WITH OR WITHOUT FEVER  NAUSEA AND VOMITING THAT IS NOT CONTROLLED WITH YOUR NAUSEA MEDICATION  *UNUSUAL SHORTNESS OF BREATH  *UNUSUAL BRUISING OR BLEEDING  TENDERNESS IN MOUTH AND THROAT WITH OR WITHOUT PRESENCE OF ULCERS  *URINARY PROBLEMS  *BOWEL PROBLEMS  UNUSUAL RASH Items with * indicate a potential emergency and should be followed up as soon as possible.  Feel free to call the clinic should you have any questions or concerns. The clinic phone number is [phone].  Please show the [facility] at check-in to the Emergency Department and triage nurse.  Pembrolizumab injection What is this medicine? PEMBROLIZUMAB (pem broe liz ue mab) is a monoclonal antibody. It is used to treat certain types of cancer. This medicine may be used for other purposes; ask your health care provider or pharmacist if you have questions. COMMON BRAND NAME(S): Keytruda What should I tell my health care provider before I take this medicine? They need to know if you have any of these conditions:  diabetes  immune system problems  inflammatory bowel disease  liver disease  lung or breathing disease  lupus  received or scheduled to receive an organ transplant or a stem-cell transplant that uses donor stem cells  an unusual or allergic reaction to pembrolizumab, other medicines, foods, dyes, or preservatives  pregnant or trying to get pregnant  breast-feeding How should I use this  medicine? This medicine is for infusion into a vein. It is given by a health care professional in a hospital or clinic setting. A special MedGuide will be given to you before each treatment. Be sure to read this information carefully each time. Talk to your pediatrician regarding the use of this medicine in children. While this drug may be prescribed for children as young as 6 months for selected conditions, precautions do apply. Overdosage: If you think you have taken too much of this medicine contact a poison control center or emergency room at once. NOTE: This medicine is only for you. Do not share this medicine with others. What if I miss a dose? It is important not to miss your dose. Call your doctor or health care professional if you are unable to keep an appointment. What may interact with this medicine? Interactions have not been studied. Give your health care provider a list of all the medicines, herbs, non-prescription drugs, or dietary supplements you use. Also tell them if you smoke, drink alcohol, or use illegal drugs. Some items may interact with your medicine. This list may not describe all possible interactions. Give your health care provider a list of all the medicines, herbs, non-prescription drugs, or dietary supplements you use. Also tell them if you smoke, drink alcohol, or use illegal drugs. Some items may interact with your medicine. What should I watch for while using this medicine? Your condition will be monitored carefully while you are receiving this medicine. You may need blood work done while you are taking this medicine. Do not become pregnant while taking this medicine or for 4 months after stopping it.  Women should inform their doctor if they wish to become pregnant or think they might be pregnant. There is a potential for serious side effects to an unborn child. Talk to your health care professional or pharmacist for more information. Do not breast-feed an infant while  taking this medicine or for 4 months after the last dose. What side effects may I notice from receiving this medicine? Side effects that you should report to your doctor or health care professional as soon as possible:  allergic reactions like skin rash, itching or hives, swelling of the face, lips, or tongue  bloody or black, tarry  breathing problems  changes in vision  chest pain  chills  confusion  constipation  cough  diarrhea  dizziness or feeling faint or lightheaded  fast or irregular heartbeat  fever  flushing  joint pain  low blood counts - this medicine may decrease the number of white blood cells, red blood cells and platelets. You may be at increased risk for infections and bleeding.  muscle pain  muscle weakness  pain, tingling, numbness in the hands or feet  persistent headache  redness, blistering, peeling or loosening of the skin, including inside the mouth  signs and symptoms of high blood sugar such as dizziness; dry mouth; dry skin; fruity breath; nausea; stomach pain; increased hunger or thirst; increased urination  signs and symptoms of kidney injury like trouble passing urine or change in the amount of urine  signs and symptoms of liver injury like dark urine, light-colored stools, loss of appetite, nausea, right upper belly pain, yellowing of the eyes or skin  sweating  swollen lymph nodes  weight loss Side effects that usually do not require medical attention (report to your doctor or health care professional if they continue or are bothersome):  decreased appetite  hair loss  muscle pain  tiredness This list may not describe all possible side effects. Call your doctor for medical advice about side effects. You may report side effects to FDA at 1-800-FDA-1088. Where should I keep my medicine? This drug is given in a hospital or clinic and will not be stored at home. NOTE: This sheet is a summary. It may not cover all  possible information. If you have questions about this medicine, talk to your doctor, pharmacist, or health care provider.  2020 Elsevier/Gold Standard (2019-03-19 18:07:58)  Pemetrexed injection What is this medicine? PEMETREXED (PEM e TREX ed) is a chemotherapy drug used to treat lung cancers like non-small cell lung cancer and mesothelioma. It may also be used to treat other cancers. This medicine may be used for other purposes; ask your health care provider or pharmacist if you have questions. COMMON BRAND NAME(S): Alimta What should I tell my health care provider before I take this medicine? They need to know if you have any of these conditions:  infection (especially a virus infection such as chickenpox, cold sores, or herpes)  kidney disease  low blood counts, like low white cell, platelet, or red cell counts  lung or breathing disease, like asthma  radiation therapy  an unusual or allergic reaction to pemetrexed, other medicines, foods, dyes, or preservative  pregnant or trying to get pregnant  breast-feeding How should I use this medicine? This drug is given as an infusion into a vein. It is administered in a hospital or clinic by a specially trained health care professional. Talk to your pediatrician regarding the use of this medicine in children. Special care may be needed. Overdosage: If  you think you have taken too much of this medicine contact a poison control center or emergency room at once. NOTE: This medicine is only for you. Do not share this medicine with others. What if I miss a dose? It is important not to miss your dose. Call your doctor or health care professional if you are unable to keep an appointment. What may interact with this medicine? This medicine may interact with the following medications:  Ibuprofen This list may not describe all possible interactions. Give your health care provider a list of all the medicines, herbs, non-prescription drugs,  or dietary supplements you use. Also tell them if you smoke, drink alcohol, or use illegal drugs. Some items may interact with your medicine. What should I watch for while using this medicine? Visit your doctor for checks on your progress. This drug may make you feel generally unwell. This is not uncommon, as chemotherapy can affect healthy cells as well as cancer cells. Report any side effects. Continue your course of treatment even though you feel ill unless your doctor tells you to stop. In some cases, you may be given additional medicines to help with side effects. Follow all directions for their use. Call your doctor or health care professional for advice if you get a fever, chills or sore throat, or other symptoms of a cold or flu. Do not treat yourself. This drug decreases your body's ability to fight infections. Try to avoid being around people who are sick. This medicine may increase your risk to bruise or bleed. Call your doctor or health care professional if you notice any unusual bleeding. Be careful brushing and flossing your teeth or using a toothpick because you may get an infection or bleed more easily. If you have any dental work done, tell your dentist you are receiving this medicine. Avoid taking products that contain aspirin, acetaminophen, ibuprofen, naproxen, or ketoprofen unless instructed by your doctor. These medicines may hide a fever. Call your doctor or health care professional if you get diarrhea or mouth sores. Do not treat yourself. To protect your kidneys, drink water or other fluids as directed while you are taking this medicine. Do not become pregnant while taking this medicine or for 6 months after stopping it. Women should inform their doctor if they wish to become pregnant or think they might be pregnant. Men should not father a child while taking this medicine and for 3 months after stopping it. This may interfere with the ability to father a child. You should talk to  your doctor or health care professional if you are concerned about your fertility. There is a potential for serious side effects to an unborn child. Talk to your health care professional or pharmacist for more information. Do not breast-feed an infant while taking this medicine or for 1 week after stopping it. What side effects may I notice from receiving this medicine? Side effects that you should report to your doctor or health care professional as soon as possible:  allergic reactions like skin rash, itching or hives, swelling of the face, lips, or tongue  breathing problems  redness, blistering, peeling or loosening of the skin, including inside the mouth  signs and symptoms of bleeding such as bloody or black, tarry stools; red or dark-brown urine; spitting up blood or brown material that looks like coffee grounds; red spots on the skin; unusual bruising or bleeding from the eye, gums, or nose  signs and symptoms of infection like fever or chills; cough;  sore throat; pain or trouble passing urine  signs and symptoms of kidney injury like trouble passing urine or change in the amount of urine  signs and symptoms of liver injury like dark yellow or brown urine; general ill feeling or flu-like symptoms; light-colored stools; loss of appetite; nausea; right upper belly pain; unusually weak or tired; yellowing of the eyes or skin Side effects that usually do not require medical attention (report to your doctor or health care professional if they continue or are bothersome):  constipation  mouth sores  nausea, vomiting  unusually weak or tired This list may not describe all possible side effects. Call your doctor for medical advice about side effects. You may report side effects to FDA at 1-800-FDA-1088. Where should I keep my medicine? This drug is given in a hospital or clinic and will not be stored at home. NOTE: This sheet is a summary. It may not cover all possible information. If  you have questions about this medicine, talk to your doctor, pharmacist, or health care provider.  2020 Elsevier/Gold Standard (2017-07-02 16:11:33)  Carboplatin injection What is this medicine? CARBOPLATIN (KAR boe pla tin) is a chemotherapy drug. It targets fast dividing cells, like cancer cells, and causes these cells to die. This medicine is used to treat ovarian cancer and many other cancers. This medicine may be used for other purposes; ask your health care provider or pharmacist if you have questions. COMMON BRAND NAME(S): Paraplatin What should I tell my health care provider before I take this medicine? They need to know if you have any of these conditions:  blood disorders  hearing problems  kidney disease  recent or ongoing radiation therapy  an unusual or allergic reaction to carboplatin, cisplatin, other chemotherapy, other medicines, foods, dyes, or preservatives  pregnant or trying to get pregnant  breast-feeding How should I use this medicine? This drug is usually given as an infusion into a vein. It is administered in a hospital or clinic by a specially trained health care professional. Talk to your pediatrician regarding the use of this medicine in children. Special care may be needed. Overdosage: If you think you have taken too much of this medicine contact a poison control center or emergency room at once. NOTE: This medicine is only for you. Do not share this medicine with others. What if I miss a dose? It is important not to miss a dose. Call your doctor or health care professional if you are unable to keep an appointment. What may interact with this medicine?  medicines for seizures  medicines to increase blood counts like filgrastim, pegfilgrastim, sargramostim  some antibiotics like amikacin, gentamicin, neomycin, streptomycin, tobramycin  vaccines Talk to your doctor or health care professional before taking any of these  medicines:  acetaminophen  aspirin  ibuprofen  ketoprofen  naproxen This list may not describe all possible interactions. Give your health care provider a list of all the medicines, herbs, non-prescription drugs, or dietary supplements you use. Also tell them if you smoke, drink alcohol, or use illegal drugs. Some items may interact with your medicine. What should I watch for while using this medicine? Your condition will be monitored carefully while you are receiving this medicine. You will need important blood work done while you are taking this medicine. This drug may make you feel generally unwell. This is not uncommon, as chemotherapy can affect healthy cells as well as cancer cells. Report any side effects. Continue your course of treatment even though you  feel ill unless your doctor tells you to stop. In some cases, you may be given additional medicines to help with side effects. Follow all directions for their use. Call your doctor or health care professional for advice if you get a fever, chills or sore throat, or other symptoms of a cold or flu. Do not treat yourself. This drug decreases your body's ability to fight infections. Try to avoid being around people who are sick. This medicine may increase your risk to bruise or bleed. Call your doctor or health care professional if you notice any unusual bleeding. Be careful brushing and flossing your teeth or using a toothpick because you may get an infection or bleed more easily. If you have any dental work done, tell your dentist you are receiving this medicine. Avoid taking products that contain aspirin, acetaminophen, ibuprofen, naproxen, or ketoprofen unless instructed by your doctor. These medicines may hide a fever. Do not become pregnant while taking this medicine. Women should inform their doctor if they wish to become pregnant or think they might be pregnant. There is a potential for serious side effects to an unborn child. Talk  to your health care professional or pharmacist for more information. Do not breast-feed an infant while taking this medicine. What side effects may I notice from receiving this medicine? Side effects that you should report to your doctor or health care professional as soon as possible:  allergic reactions like skin rash, itching or hives, swelling of the face, lips, or tongue  signs of infection - fever or chills, cough, sore throat, pain or difficulty passing urine  signs of decreased platelets or bleeding - bruising, pinpoint red spots on the skin, black, tarry stools, nosebleeds  signs of decreased red blood cells - unusually weak or tired, fainting spells, lightheadedness  breathing problems  changes in hearing  changes in vision  chest pain  high blood pressure  low blood counts - This drug may decrease the number of white blood cells, red blood cells and platelets. You may be at increased risk for infections and bleeding.  nausea and vomiting  pain, swelling, redness or irritation at the injection site  pain, tingling, numbness in the hands or feet  problems with balance, talking, walking  trouble passing urine or change in the amount of urine Side effects that usually do not require medical attention (report to your doctor or health care professional if they continue or are bothersome):  hair loss  loss of appetite  metallic taste in the mouth or changes in taste This list may not describe all possible side effects. Call your doctor for medical advice about side effects. You may report side effects to FDA at 1-800-FDA-1088. Where should I keep my medicine? This drug is given in a hospital or clinic and will not be stored at home. NOTE: This sheet is a summary. It may not cover all possible information. If you have questions about this medicine, talk to your doctor, pharmacist, or health care provider.  2020 Elsevier/Gold Standard (2007-08-18 14:38:05)

## 2019-07-15 ENCOUNTER — Ambulatory Visit (HOSPITAL_COMMUNITY)
Admission: RE | Admit: 2019-07-15 | Discharge: 2019-07-15 | Disposition: A | Discharge status: Home / Self Care | Payer: Medicare Other | Source: Ambulatory Visit | Attending: Internal Medicine | Admitting: Internal Medicine

## 2019-07-15 ENCOUNTER — Other Ambulatory Visit: Payer: Self-pay | Admitting: Internal Medicine

## 2019-07-15 ENCOUNTER — Encounter (HOSPITAL_COMMUNITY): Payer: Self-pay

## 2019-07-15 DIAGNOSIS — Z7984 Long term (current) use of oral hypoglycemic drugs: Secondary | ICD-10-CM | POA: Diagnosis not present

## 2019-07-15 DIAGNOSIS — Z87891 Personal history of nicotine dependence: Secondary | ICD-10-CM | POA: Insufficient documentation

## 2019-07-15 DIAGNOSIS — I252 Old myocardial infarction: Secondary | ICD-10-CM | POA: Insufficient documentation

## 2019-07-15 DIAGNOSIS — Z7982 Long term (current) use of aspirin: Secondary | ICD-10-CM | POA: Insufficient documentation

## 2019-07-15 DIAGNOSIS — Z79899 Other long term (current) drug therapy: Secondary | ICD-10-CM | POA: Diagnosis not present

## 2019-07-15 DIAGNOSIS — Z7901 Long term (current) use of anticoagulants: Secondary | ICD-10-CM | POA: Diagnosis not present

## 2019-07-15 DIAGNOSIS — E1151 Type 2 diabetes mellitus with diabetic peripheral angiopathy without gangrene: Secondary | ICD-10-CM | POA: Diagnosis not present

## 2019-07-15 DIAGNOSIS — C3490 Malignant neoplasm of unspecified part of unspecified bronchus or lung: Secondary | ICD-10-CM

## 2019-07-15 DIAGNOSIS — I10 Essential (primary) hypertension: Secondary | ICD-10-CM | POA: Insufficient documentation

## 2019-07-15 DIAGNOSIS — E785 Hyperlipidemia, unspecified: Secondary | ICD-10-CM | POA: Insufficient documentation

## 2019-07-15 DIAGNOSIS — Z7902 Long term (current) use of antithrombotics/antiplatelets: Secondary | ICD-10-CM | POA: Insufficient documentation

## 2019-07-15 HISTORY — PX: IR IMAGING GUIDED PORT INSERTION: IMG5740

## 2019-07-15 LAB — GLUCOSE, CAPILLARY: Glucose-Capillary: 164 mg/dL — ABNORMAL HIGH (ref 70–99)

## 2019-07-15 MED ORDER — MIDAZOLAM HCL 2 MG/2ML IJ SOLN
INTRAMUSCULAR | Status: AC
  Filled 2019-07-15: qty 4

## 2019-07-15 MED ORDER — CEFAZOLIN SODIUM-DEXTROSE 2-4 GM/100ML-% IV SOLN: 2.0000 g | INTRAVENOUS | Status: AC

## 2019-07-15 MED ORDER — FENTANYL CITRATE (PF) 100 MCG/2ML IJ SOLN
INTRAMUSCULAR | Status: AC
  Filled 2019-07-15: qty 2

## 2019-07-15 MED ORDER — HEPARIN SOD (PORK) LOCK FLUSH 100 UNIT/ML IV SOLN
INTRAVENOUS | Status: AC
  Filled 2019-07-15: qty 5

## 2019-07-15 MED ORDER — SODIUM CHLORIDE 0.9 % IV SOLN: INTRAVENOUS | Status: DC

## 2019-07-15 MED ORDER — CEFAZOLIN SODIUM-DEXTROSE 2-4 GM/100ML-% IV SOLN
INTRAVENOUS | Status: AC
  Administered 2019-07-15: 2 g via INTRAVENOUS
  Filled 2019-07-15: qty 100

## 2019-07-15 MED ORDER — LIDOCAINE HCL (PF) 1 % IJ SOLN
INTRAMUSCULAR | Status: AC | PRN
  Administered 2019-07-15: 15 mL

## 2019-07-15 MED ORDER — HEPARIN SOD (PORK) LOCK FLUSH 100 UNIT/ML IV SOLN
INTRAVENOUS | Status: AC | PRN
  Administered 2019-07-15: 500 [IU] via INTRAVENOUS

## 2019-07-15 MED ORDER — MIDAZOLAM HCL 2 MG/2ML IJ SOLN
INTRAMUSCULAR | Status: AC | PRN
  Administered 2019-07-15 (×4): 1 mg via INTRAVENOUS

## 2019-07-15 MED ORDER — FENTANYL CITRATE (PF) 100 MCG/2ML IJ SOLN
INTRAMUSCULAR | Status: AC | PRN
  Administered 2019-07-15 (×2): 50 ug via INTRAVENOUS

## 2019-07-15 MED ORDER — LIDOCAINE HCL 1 % IJ SOLN
INTRAMUSCULAR | Status: AC
  Filled 2019-07-15: qty 20

## 2019-07-15 NOTE — H&P (Signed)
Chief Complaint: Patient was seen in consultation today for lung cancer/Port-a-cath placement.  Referring Physician(s): Mohamed,Mohamed  Supervising Physician: Aletta Edouard  Patient Status: [facility] - Out-pt  History of Present Illness: Kirk [name] is a 64 y.o. male with a past medical history of hypertension, hyperlipidemia, MI 2017, PAD s/p bilateral iliac stents 2003, atrial flutter, lung cancer, diabetes mellitus type II, and arthritis. He was unfortunately diagnosed with stage IV non-small cell lung cancer (poorly differentiated adenocarcinoma) in 06/2019. His cancer is managed by Dr. [name]. He has tentative plans to begin systemic chemotherapy for management.  IR requested by Dr. [name] for possible image-guided Port-a-cath placement. Patient awake and alert sitting in bed with no complaints at this time. Denies fever, chills, chest pain, dyspnea, abdominal pain, or headache.  Currently taking Plavix 75 mg once daily- last dose 07/08/2019.   Past Medical History:  Diagnosis Date  . Arthritis    "right knee" (09/04/2015)  . Atrial flutter with rapid ventricular response ([place]) 04/19/2019  . Dyspnea    increased exertion  . HTN (hypertension)   . Hyperlipidemia   . NSTEMI (non-ST elevated myocardial infarction) ([place]) 09/04/2015   a. cath 09/05/2015: 95% stenosis mid-LAD, 55% mid-RCA, and 40% prox-RCA. PCI performed w/ Synergy DES to LAD  . PAD (peripheral artery disease) ([facility])    Stenting of bilateral iliacs in 2003.  [name] Refusal of blood transfusions as patient is Jehovah's Witness   . Type II diabetes mellitus ([place])     Past Surgical History:  Procedure Laterality Date  . CARDIAC CATHETERIZATION N/A 09/05/2015   Procedure: Left Heart Cath and Coronary Angiography;  Surgeon: Troy Sine, MD;  Location: [facility];  Service: Cardiovascular;  Laterality: N/A;  . CARDIAC CATHETERIZATION N/A 09/05/2015   Procedure: Coronary Stent Intervention;  Surgeon: Troy Sine, MD;  Location: [facility];  Service: Cardiovascular;  Laterality: N/A;  . 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 PERIPHERAL VASCULAR CATHETERIZATION Bilateral 2003   "stenting"  . TONSILLECTOMY  ~ 1963    Allergies: Patient has no known allergies.  Medications: Prior to Admission medications   Medication Sig Start Date End Date Taking? Authorizing Provider  acetaminophen (TYLENOL) 500 MG tablet Take 500 mg by mouth every 6 (six) hours as needed for fever.    [provider]  apixaban (ELIQUIS) 5 MG TABS tablet Take 1 tablet (5 mg total) by mouth 2 (two) times daily. 04/20/19   Cheryln Manly, NP  aspirin EC 81 MG tablet Take 81 mg by mouth at bedtime.    [provider]  carvedilol (COREG) 12.5 MG tablet Take 1 tablet (12.5 mg total) by mouth 2 (two) times daily with a meal. 06/09/19   Imogene Burn, PA-C  cetirizine (ZYRTEC) 10 MG tablet Take 1 tablet (10 mg total) by mouth daily. 07/08/18   [name], FNP  clopidogrel (PLAVIX) 75 MG tablet Take 75 mg by mouth daily.    [provider]  folic acid (FOLVITE) 1 MG tablet Take 1 tablet (1 mg total) by mouth daily. 07/07/19   Curt Bears, MD  glimepiride (AMARYL) 4 MG tablet Take 1 tablet (4 mg total) by mouth daily. 02/19/19   [name], FNP  HYDROcodone-homatropine [facility]) 5-1.5 MG/5ML syrup Take 5 mLs by mouth every 8 (eight) hours as needed for cough. 05/10/19   [name], FNP  Hydrocortisone Acetate 1 % CREA Apply  15 g topically 2 (two) times daily. 05/10/19   [name], FNP  ibuprofen (ADVIL) 200 MG tablet Take 600 mg by mouth once.    [provider]  ipratropium (ATROVENT) 0.03 % nasal spray USE 2 SPRAYS IN EACH NOSTRIL TWICE DAILY 01/13/19   [name], FNP  lidocaine-prilocaine (EMLA) cream Apply to the Port-A-Cath site 30-60 minutes before treatment. 07/07/19   Curt Bears, MD  lisinopril (ZESTRIL) 10 MG  tablet Take 1 tablet (10 mg total) by mouth daily. 02/19/19   [name], FNP  metFORMIN (GLUCOPHAGE) 1000 MG tablet Take 1 tablet (1,000 mg total) by mouth 2 (two) times daily with a meal. 03/29/19   [name], FNP  methylPREDNISolone (MEDROL) 4 MG TBPK tablet Take per package directions. 07/02/19   Curt Bears, MD  multivitamin (ONE-A-DAY MEN'S) TABS tablet Take 1 tablet by mouth daily. 11/11/18   [name], FNP  nitroGLYCERIN (NITROSTAT) 0.4 MG SL tablet Place 1 tablet (0.4 mg total) under the tongue every 5 (five) minutes x 3 doses as needed for chest pain. 08/26/17   Imogene Burn, PA-C  oxyCODONE-acetaminophen (PERCOCET/ROXICET) 5-325 MG tablet Take 1 tablet by mouth every 6 (six) hours as needed for severe pain. 06/28/19   Curt Bears, MD  pantoprazole (PROTONIX) 40 MG tablet TAKE 1 TABLET BY MOUTH EVERY DAY 06/04/19   Armbruster, Carlota Raspberry, MD  prochlorperazine (COMPAZINE) 10 MG tablet Take 1 tablet (10 mg total) by mouth every 6 (six) hours as needed for nausea or vomiting. 07/07/19   Curt Bears, MD  rosuvastatin (CRESTOR) 40 MG tablet TAKE 1 TABLET BY MOUTH DAILY 05/12/19   Burnell Blanks, MD  sucralfate (CARAFATE) 1 GM/10ML suspension Take 10 mLs (1 g total) by mouth 4 (four) times daily. 05/13/19   Armbruster, Carlota Raspberry, MD     Family History  Problem Relation Age of Onset  . Lymphoma Mother   . Cancer - Prostate Father   . CAD Neg Hx   . Colon cancer Neg Hx   . Rectal cancer Neg Hx   . Stomach cancer Neg Hx   . Esophageal cancer Neg Hx     Social History   Socioeconomic History  . Marital status: Married    Spouse name: Not on file  . Number of children: 1  . Years of education: Not on file  . Highest education level: Not on file  Occupational History  . Occupation: Acupuncturist  Tobacco Use  . Smoking status: Former Smoker    Packs/day: 2.00    Years: 35.00    Pack years: 70.00    Types: Cigarettes    Quit date: 02/19/2003     Years since quitting: 16.4  . Smokeless tobacco: Never Used  Substance and Sexual Activity  . Alcohol use: Not Currently    Alcohol/week: 0.0 standard drinks    Comment: 09/04/2015 "nothing since 1980s"  . 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 Sexual activity: Not on file  Other Topics Concern  . Not on file  Social History Narrative  . Not on file   Social Determinants of Health   Financial Resource Strain:   . Difficulty of Paying Living Expenses: Not on file  Food Insecurity:   . Worried About Charity fundraiser in the Last Year: Not on file  . Ran Out of Food in the Last Year: Not on file  Transportation Needs:   . Film/video editor (Medical): Not on file  . Lack of Transportation (Non-Medical): Not on file  Physical Activity:   . Days of Exercise per Week: Not on file  . Minutes of Exercise per Session: Not on file  Stress:   . Feeling of Stress : Not on file  Social Connections:   . Frequency of Communication with Friends and Family: Not on file  . Frequency of Social Gatherings with Friends and Family: Not on file  . Attends Religious Services: Not on file  . Active Member of Clubs or Organizations: Not on file  . Attends Archivist Meetings: Not on file  . Marital Status: Not on file     Review of Systems: A 12 point ROS discussed and pertinent positives are indicated in the HPI above.  All other systems are negative.  Review of Systems  Constitutional: Negative for chills and fever.  Respiratory: Negative for shortness of breath and wheezing.   Cardiovascular: Negative for chest pain and palpitations.  Gastrointestinal: Negative for abdominal pain.  Neurological: Negative for headaches.  Psychiatric/Behavioral: Negative for behavioral problems and confusion.    Vital Signs: BP 137/63   Pulse 91   Temp 98.1 F (36.7 C) (Oral)   Resp 18   SpO2 100%   Physical Exam Vitals and nursing note  reviewed.  Constitutional:      General: He is not in acute distress.    Appearance: Normal appearance.  Cardiovascular:     Rate and Rhythm: Normal rate and regular rhythm.     Heart sounds: Normal heart sounds. No murmur.  Pulmonary:     Effort: Pulmonary effort is normal. No respiratory distress.     Breath sounds: Normal breath sounds. No wheezing.  Skin:    General: Skin is warm and dry.  Neurological:     Mental Status: He is alert and oriented to person, place, and time.  Psychiatric:        Mood and Affect: Mood normal.        Behavior: Behavior normal.      MD Evaluation Airway: WNL Heart: WNL Abdomen: WNL Chest/ Lungs: WNL ASA  Classification: 3 Mallampati/Airway Score: One   Imaging: [place] CORE BIOPSY (LYMPH NODES)  Result Date: 06/24/2019 INDICATION: 64 year old male with suspected metastatic disease of uncertain primary etiology. Small cell lung cancer is suspected. Patient presents for ultrasound-guided core biopsy of supraclavicular lymph node. EXAM: ULTRASOUND OF THE LYMPH NODES MEDICATIONS: None. ANESTHESIA/SEDATION: Moderate (conscious) sedation was employed during this procedure. A total of Versed to mg and Fentanyl 100 mcg was administered intravenously. Moderate Sedation Time: 14 minutes. The patient's level of consciousness and vital signs were monitored continuously by radiology nursing throughout the procedure under my direct supervision. FLUOROSCOPY TIME:  None. COMPLICATIONS: None immediate. PROCEDURE: Informed written consent was obtained from the patient after a thorough discussion of the procedural risks, benefits and alternatives. All questions were addressed. Maximal Sterile Barrier Technique was utilized including caps, mask, sterile gowns, sterile gloves, sterile drape, hand hygiene and skin antiseptic. A timeout was performed prior to the initiation of the procedure. Ultrasound was used to interrogate the left supraclavicular fossa. A large lobulated  lymph node measuring 3.2 x 1.4 cm is successfully identified. This lymph node is both enlarged and morphologically abnormal. The overlying skin was sterilely prepped and draped in the standard fashion using chlorhexidine skin prep. Local anesthesia was attained by infiltration with 1% lidocaine. A small dermatotomy was made. Under  real-time ultrasound guidance, multiple 18 gauge core biopsies were obtained using the [employer] mission automated biopsy device. Biopsy specimens were placed in formalin and delivered to pathology for further analysis. Post biopsy ultrasound imaging demonstrates no evidence of hemorrhage or other complication. The patient tolerated the procedure well. IMPRESSION: Successful ultrasound-guided core biopsy of left supraclavicular lymphadenopathy. Electronically Signed   By: Jacqulynn Cadet M.D.   On: 06/24/2019 18:55    Labs:  CBC: Recent Labs    06/24/19 1130 06/28/19 0936 07/07/19 0926 07/14/19 1059  WBC 8.1 8.2 11.2* 8.7  HGB 12.3* 12.7* 13.2 12.1*  HCT 39.1 39.7 41.4 37.6*  PLT 332 334 427* 368    COAGS: Recent Labs    04/19/19 1614 06/24/19 1130  INR 1.1 1.1  APTT 27  --     BMP: Recent Labs    06/24/19 1130 06/28/19 0936 07/07/19 0926 07/14/19 1059  NA 139 140 140 136  K 4.4 4.2 4.7 4.9  CL 106 102 102 101  CO2 24 27 27 27   GLUCOSE 162* 152* 179* 170*  BUN 10 12 17 15   CALCIUM 9.7 9.7 9.7 9.7  CREATININE 0.94 1.09 1.12 1.04  GFRNONAA >60 >60 >60 >60  GFRAA >60 >60 >60 >60    LIVER FUNCTION TESTS: Recent Labs    06/24/19 1130 06/28/19 0936 07/07/19 0926 07/14/19 1059  BILITOT 0.6 0.5 0.3 0.3  AST 17 15 16  13*  ALT 15 15 18 14   ALKPHOS 63 73 67 69  PROT 8.7* 8.8* 8.7* 8.7*  ALBUMIN 4.6 4.4 4.2 4.0     Assessment and Plan:  Stage IV non-small cell lung cancer with tentative plans to begin systemic chemotherapy for management. Plan for image-guided Port-a-cath placement today in IR. Patient is NPO. Afebrile. Last dose Plavix  07/08/2019- ok to proceed per IR protocol.  Risks and benefits of image-guided Port-a-catheter placement were discussed with the patient including, but not limited to bleeding, infection, pneumothorax, or fibrin sheath development and need for additional procedures. All of the patient's questions were answered, patient is agreeable to proceed. Consent signed and in chart.   Thank you for this interesting consult.  I greatly enjoyed meeting Kirk [name] and look forward to participating in their care.  A copy of this report was sent to the requesting provider on this date.  Electronically Signed: Earley Abide, PA-C 07/15/2019, 10:13 AM   I spent a total of 25 Minutes in face to face in clinical consultation, greater than 50% of which was counseling/coordinating care for lung cancer/Port-a-cath placement.

## 2019-07-15 NOTE — Procedures (Signed)
Interventional Radiology Procedure Note  Procedure: Single Lumen Power Port Placement    Access:  Right IJ vein.  Findings: Catheter tip positioned at SVC/RA junction. Port is ready for immediate use.   Complications: None  EBL: < 10 mL  Recommendations:  - Ok to shower in 24 hours - Do not submerge for 7 days - Routine line care   Jenell Dobransky T. Dazia Lippold, M.D Pager:  [phone]   

## 2019-07-15 NOTE — Discharge Instructions (Signed)
Urgent Needs - IR MD on call [phone]  Wound - May remove dressing in 24 to 48 hours and shower.  Keep site clean and dry, replace with clean bandaids as needed. Do not submerge in water until incision healed. EMLA cream - if ordered, may start in 2 weeks (or after incision is healing well) After treatments complete, your provider should have you set up for appointments to have port flushed monthly.  Moderate Conscious Sedation, Adult, Care After These instructions provide you with information about caring for yourself after your procedure. Your health care provider may also give you more specific instructions. Your treatment has been planned according to current medical practices, but problems sometimes occur. Call your health care provider if you have any problems or questions after your procedure. What can I expect after the procedure? After your procedure, it is common:  To feel sleepy for several hours.  To feel clumsy and have poor balance for several hours.  To have poor judgment for several hours.  To vomit if you eat too soon. Follow these instructions at home: For at least 24 hours after the procedure:  Do not: ? Participate in activities where you could fall or become injured. ? Drive. ? Use heavy machinery. ? Drink alcohol. ? Take sleeping pills or medicines that cause drowsiness. ? Make important decisions or sign legal documents. ? Take care of children on your own.  Rest. Eating and drinking  Follow the diet recommended by your health care provider.  If you vomit: ? Drink water, juice, or soup when you can drink without vomiting. ? Make sure you have little or no nausea before eating solid foods. General instructions  Have a responsible adult stay with you until you are awake and alert.  Take over-the-counter and prescription medicines only as told by your health care provider.  If you smoke, do not smoke without supervision.  Keep all follow-up  visits as told by your health care provider. This is important. Contact a health care provider if:  You keep feeling nauseous or you keep vomiting.  You feel light-headed.  You develop a rash.  You have a fever. Get help right away if:  You have trouble breathing. This information is not intended to replace advice given to you by your health care provider. Make sure you discuss any questions you have with your health care provider. Document Revised: 04/25/2017 Document Reviewed: 09/02/2015 Elsevier Patient Education  [place] Insertion, Care After This sheet gives you information about how to care for yourself after your procedure. Your health care provider may also give you more specific instructions. If you have problems or questions, contact your health care provider. What can I expect after the procedure? After the procedure, it is common to have:  Discomfort at the port insertion site.  Bruising on the skin over the port. This should improve over 3-4 days. Follow these instructions at home: [facility] care  After your port is placed, you will get a manufacturer's information card. The card has information about your port. Keep this card with you at all times.  Take care of the port as told by your health care provider. Ask your health care provider if you or a family member can get training for taking care of the port at home. A home health care nurse may also take care of the port.  Make sure to remember what type of port you have. Incision care  Follow instructions from your  health care provider about how to take care of your port insertion site. Make sure you: ? Wash your hands with soap and water before and after you change your bandage (dressing). If soap and water are not available, use hand sanitizer. ? Change your dressing as told by your health care provider. ? Leave stitches (sutures), skin glue, or adhesive strips in place. These skin  closures may need to stay in place for 2 weeks or longer. If adhesive strip edges start to loosen and curl up, you may trim the loose edges. Do not remove adhesive strips completely unless your health care provider tells you to do that.  Check your port insertion site every day for signs of infection. Check for: ? Redness, swelling, or pain. ? Fluid or blood. ? Warmth. ? Pus or a bad smell. Activity  Return to your normal activities as told by your health care provider. Ask your health care provider what activities are safe for you.  Do not lift anything that is heavier than 10 lb (4.5 kg), or the limit that you are told, until your health care provider says that it is safe. General instructions  Take over-the-counter and prescription medicines only as told by your health care provider.  Do not take baths, swim, or use a hot tub until your health care provider approves. Ask your health care provider if you may take showers. You may only be allowed to take sponge baths.  Do not drive for 24 hours if you were given a sedative during your procedure.  Wear a medical alert bracelet in case of an emergency. This will tell any health care providers that you have a port.  Keep all follow-up visits as told by your health care provider. This is important. Contact a health care provider if:  You cannot flush your port with saline as directed, or you cannot draw blood from the port.  You have a fever or chills.  You have redness, swelling, or pain around your port insertion site.  You have fluid or blood coming from your port insertion site.  Your port insertion site feels warm to the touch.  You have pus or a bad smell coming from the port insertion site. Get help right away if:  You have chest pain or shortness of breath.  You have bleeding from your port that you cannot control. Summary  Take care of the port as told by your health care provider. Keep the manufacturer's information  card with you at all times.  Change your dressing as told by your health care provider.  Contact a health care provider if you have a fever or chills or if you have redness, swelling, or pain around your port insertion site.  Keep all follow-up visits as told by your health care provider. This information is not intended to replace advice given to you by your health care provider. Make sure you discuss any questions you have with your health care provider. Document Revised: 12/09/2017 Document Reviewed: 12/09/2017 Elsevier Patient Education  [place].

## 2019-07-16 ENCOUNTER — Telehealth: Payer: Self-pay | Admitting: *Deleted

## 2019-07-20 ENCOUNTER — Encounter (HOSPITAL_COMMUNITY): Payer: Self-pay | Admitting: Internal Medicine

## 2019-07-21 ENCOUNTER — Other Ambulatory Visit: Payer: Self-pay

## 2019-07-21 ENCOUNTER — Encounter: Payer: Self-pay | Admitting: Internal Medicine

## 2019-07-21 ENCOUNTER — Inpatient Hospital Stay (HOSPITAL_BASED_OUTPATIENT_CLINIC_OR_DEPARTMENT_OTHER): Payer: Medicare Other | Admitting: Internal Medicine

## 2019-07-21 ENCOUNTER — Inpatient Hospital Stay: Payer: Medicare Other

## 2019-07-21 VITALS — BP 125/63 | HR 102 | Temp 97.9°F | Resp 18 | Ht 75.0 in | Wt 205.7 lb

## 2019-07-21 DIAGNOSIS — C3490 Malignant neoplasm of unspecified part of unspecified bronchus or lung: Secondary | ICD-10-CM

## 2019-07-21 DIAGNOSIS — Z5111 Encounter for antineoplastic chemotherapy: Secondary | ICD-10-CM

## 2019-07-21 DIAGNOSIS — Z5112 Encounter for antineoplastic immunotherapy: Secondary | ICD-10-CM

## 2019-07-21 DIAGNOSIS — C3491 Malignant neoplasm of unspecified part of right bronchus or lung: Secondary | ICD-10-CM

## 2019-07-21 DIAGNOSIS — I2510 Atherosclerotic heart disease of native coronary artery without angina pectoris: Secondary | ICD-10-CM | POA: Diagnosis not present

## 2019-07-21 DIAGNOSIS — C787 Secondary malignant neoplasm of liver and intrahepatic bile duct: Secondary | ICD-10-CM

## 2019-07-21 LAB — CMP (CANCER CENTER ONLY)
ALT: 17 U/L (ref 0–44)
AST: 13 U/L — ABNORMAL LOW (ref 15–41)
Albumin: 3.9 g/dL (ref 3.5–5.0)
Alkaline Phosphatase: 73 U/L (ref 38–126)
Anion gap: 9 (ref 5–15)
BUN: 14 mg/dL (ref 8–23)
CO2: 27 mmol/L (ref 22–32)
Calcium: 9.5 mg/dL (ref 8.9–10.3)
Chloride: 100 mmol/L (ref 98–111)
Creatinine: 0.95 mg/dL (ref 0.61–1.24)
GFR, Est AFR Am: >60 mL/min (ref >60)
GFR, Estimated: >60 mL/min (ref >60)
Glucose, Bld: 216 mg/dL — ABNORMAL HIGH (ref 70–99)
Potassium: 4.6 mmol/L (ref 3.5–5.1)
Sodium: 136 mmol/L (ref 135–145)
Total Bilirubin: 0.4 mg/dL (ref 0.3–1.2)
Total Protein: 8.1 g/dL (ref 6.5–8.1)

## 2019-07-21 LAB — CBC WITH DIFFERENTIAL (CANCER CENTER ONLY)
Abs Immature Granulocytes: 0.01 10*3/uL (ref 0.00–0.07)
Basophils Absolute: 0 10*3/uL (ref 0.0–0.1)
Basophils Relative: 1 %
Eosinophils Absolute: 0.3 10*3/uL (ref 0.0–0.5)
Eosinophils Relative: 8 %
HCT: 36.5 % — ABNORMAL LOW (ref 39.0–52.0)
Hemoglobin: 11.8 g/dL — ABNORMAL LOW (ref 13.0–17.0)
Immature Granulocytes: 0 %
Lymphocytes Relative: 65 %
Lymphs Abs: 2 10*3/uL (ref 0.7–4.0)
MCH: 27.8 pg (ref 26.0–34.0)
MCHC: 32.3 g/dL (ref 30.0–36.0)
MCV: 86.1 fL (ref 80.0–100.0)
Monocytes Absolute: 0.1 10*3/uL (ref 0.1–1.0)
Monocytes Relative: 4 %
Neutro Abs: 0.7 10*3/uL — ABNORMAL LOW (ref 1.7–7.7)
Neutrophils Relative %: 22 %
Platelet Count: 226 10*3/uL (ref 150–400)
RBC: 4.24 MIL/uL (ref 4.22–5.81)
RDW: 12.9 % (ref 11.5–15.5)
WBC Count: 3.2 10*3/uL — ABNORMAL LOW (ref 4.0–10.5)
nRBC: 0 % (ref 0.0–0.2)

## 2019-07-21 NOTE — Progress Notes (Signed)
Chief Complaint  Patient presents with  . Follow-up    CAD   History of Present Illness: 64 yo male with history of CAD, PAD, HLD, HTN, atrial flutter and DM who is here today for follow up. He has a history of PAD s/p bilateral lower extremity stenting in 2003. ABI March 2019 stable. He had his first cardiac event in April 2017 when he was admitted to [facility] with a NSTEMI. Cardiac cath 09/05/15 with high grade mid LAD stenosis treated with a drug eluting stent x 1. There was mild to moderate non-obstructive disease in the Circumflex and RCA. Echo April 2017 with normal LV systolic function. He was seen in our office April 2019 with c/o dyspnea. Nuclear stress test April 2019 with no ischemia. He was admitted to [facility] in November 2020 with atrial flutter. Echo November 2020 with LVEF=60-65%, no valve disease. He was started on Eliquis. CT chest December 2020 with multiple lesions. He has since been diagnosed with non small cell lung cancer and is on chemotherapy. He stopped Eliquis due to cost and does not wish to restart. He does not wish to start coumadin.   He is here today for follow up. The patient denies any chest pain, dyspnea, palpitations, lower extremity edema, orthopnea, PND, dizziness, near syncope or syncope.   Primary Care Physician: [name], FNP  Past Medical History:  Diagnosis Date  . Arthritis    "right knee" (09/04/2015)  . Atrial flutter with rapid ventricular response ([place]) 04/19/2019  . Dyspnea    increased exertion  . HTN (hypertension)   . Hyperlipidemia   . NSTEMI (non-ST elevated myocardial infarction) ([place]) 09/04/2015   a. cath 09/05/2015: 95% stenosis mid-LAD, 55% mid-RCA, and 40% prox-RCA. PCI performed w/ Synergy DES to LAD  . PAD (peripheral artery disease) (HCC)    Stenting of bilateral iliacs in 2003.  [name] Refusal of blood transfusions as patient is Jehovah's Witness   . Type II diabetes mellitus ([place])     Past Surgical History:  Procedure  Laterality Date  . CARDIAC CATHETERIZATION N/A 09/05/2015   Procedure: Left Heart Cath and Coronary Angiography;  Surgeon: Troy Sine, MD;  Location: [place] [facility];  Service: Cardiovascular;  Laterality: N/A;  . CARDIAC CATHETERIZATION N/A 09/05/2015   Procedure: Coronary Stent Intervention;  Surgeon: Troy Sine, MD;  Location: [place] [facility];  Service: Cardiovascular;  Laterality: N/A;  . IR IMAGING GUIDED PORT INSERTION  07/15/2019  . 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 PERIPHERAL VASCULAR CATHETERIZATION Bilateral 2003   "stenting"  . TONSILLECTOMY  ~ 1963    Current Outpatient Medications  Medication Sig Dispense Refill  . acetaminophen (TYLENOL) 500 MG tablet Take 500 mg by mouth every 6 (six) hours as needed for fever.    [name] aspirin EC 81 MG tablet Take 81 mg by mouth at bedtime.    . carvedilol (COREG) 12.5 MG tablet Take 1 tablet (12.5 mg total) by mouth 2 (two) times daily with a meal. 180 tablet 3  . cetirizine (ZYRTEC) 10 MG tablet Take 1 tablet (10 mg total) by mouth daily. 30 tablet 11  . clopidogrel (PLAVIX) 75 MG tablet Take 1 tablet (75 mg total) by mouth daily. 90 tablet 3  . folic acid (FOLVITE) 1 MG tablet Take 1 tablet (1 mg total) by mouth daily. 30 tablet 4  . glimepiride (AMARYL) 4 MG tablet Take 1 tablet (4  mg total) by mouth daily. 90 tablet 1  . HYDROcodone-homatropine (HYCODAN) 5-1.5 MG/5ML syrup Take 5 mLs by mouth every 8 (eight) hours as needed for cough. 120 mL 0  . Hydrocortisone Acetate 1 % CREA Apply 15 g topically 2 (two) times daily. 15 g 3  . ibuprofen (ADVIL) 200 MG tablet Take 600 mg by mouth once.    [name] ipratropium (ATROVENT) 0.03 % nasal spray USE 2 SPRAYS IN EACH NOSTRIL TWICE DAILY 30 mL 2  . lidocaine-prilocaine (EMLA) cream Apply to the Port-A-Cath site 30-60 minutes before treatment. 30 g 0  . lisinopril (ZESTRIL) 10 MG tablet Take 1 tablet (10 mg total) by mouth daily. 90 tablet 1  . metFORMIN  (GLUCOPHAGE) 1000 MG tablet Take 1 tablet (1,000 mg total) by mouth 2 (two) times daily with a meal. 180 tablet 1  . multivitamin (ONE-A-DAY MEN'S) TABS tablet Take 1 tablet by mouth daily. 30 tablet 6  . nitroGLYCERIN (NITROSTAT) 0.4 MG SL tablet Place 1 tablet (0.4 mg total) under the tongue every 5 (five) minutes x 3 doses as needed for chest pain. 25 tablet 2  . oxyCODONE-acetaminophen (PERCOCET/ROXICET) 5-325 MG tablet Take 1 tablet by mouth every 6 (six) hours as needed for severe pain. 30 tablet 0  . pantoprazole (PROTONIX) 40 MG tablet TAKE 1 TABLET BY MOUTH EVERY DAY 30 tablet 5  . prochlorperazine (COMPAZINE) 10 MG tablet Take 1 tablet (10 mg total) by mouth every 6 (six) hours as needed for nausea or vomiting. 30 tablet 0  . rosuvastatin (CRESTOR) 40 MG tablet TAKE 1 TABLET BY MOUTH DAILY 90 tablet 0  . sucralfate (CARAFATE) 1 GM/10ML suspension Take 10 mLs (1 g total) by mouth 4 (four) times daily. 420 mL 1   No current facility-administered medications for this visit.    No Known Allergies  Social History   Socioeconomic History  . Marital status: Married    Spouse name: Not on file  . Number of children: 1  . Years of education: Not on file  . Highest education level: Not on file  Occupational History  . Occupation: Acupuncturist  Tobacco Use  . Smoking status: Former Smoker    Packs/day: 2.00    Years: 35.00    Pack years: 70.00    Types: Cigarettes    Quit date: 02/19/2003    Years since quitting: 16.4  . Smokeless tobacco: Never Used  Substance and Sexual Activity  . Alcohol use: Not Currently    Alcohol/week: 0.0 standard drinks    Comment: 09/04/2015 "nothing since 1980s"  . 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 Sexual activity: Not on file  Other Topics Concern  . Not on file  Social History Narrative  . Not on file   Social Determinants of Health   Financial Resource Strain:   . Difficulty of Paying Living  Expenses: Not on file  Food Insecurity:   . Worried About Charity fundraiser in the Last Year: Not on file  . Ran Out of Food in the Last Year: Not on file  Transportation Needs:   . Lack of Transportation (Medical): Not on file  . Lack of Transportation (Non-Medical): Not on file  Physical Activity:   . Days of Exercise per Week: Not on file  . Minutes of Exercise per Session: Not on file  Stress:   . Feeling of Stress : Not on file  Social Connections:   .  Frequency of Communication with Friends and Family: Not on file  . Frequency of Social Gatherings with Friends and Family: Not on file  . Attends Religious Services: Not on file  . Active Member of Clubs or Organizations: Not on file  . Attends Archivist Meetings: Not on file  . Marital Status: Not on file  Intimate Partner Violence:   . Fear of Current or Ex-Partner: Not on file  . Emotionally Abused: Not on file  . Physically Abused: Not on file  . Sexually Abused: Not on file    Family History  Problem Relation Age of Onset  . Lymphoma Mother   . Cancer - Prostate Father   . CAD Neg Hx   . Colon cancer Neg Hx   . Rectal cancer Neg Hx   . Stomach cancer Neg Hx   . Esophageal cancer Neg Hx     Review of Systems:  As stated in the HPI and otherwise negative.   BP 112/60   Pulse 85   Ht 6\' 3"  (1.905 m)   Wt 210 lb 12.8 oz (95.6 kg)   SpO2 99%   BMI 26.35 kg/m   Physical Examination:  General: Well developed, well nourished, NAD  HEENT: OP clear, mucus membranes moist  SKIN: warm, dry. No rashes. Neuro: No focal deficits  Musculoskeletal: Muscle strength 5/5 all ext  Psychiatric: Mood and affect normal  Neck: No JVD, no carotid bruits, no thyromegaly, no lymphadenopathy.  Lungs:Clear bilaterally, no wheezes, rhonci, crackles Cardiovascular: Regular rate and rhythm. No murmurs, gallops or rubs. Abdomen:Soft. Bowel sounds present. Non-tender.  Extremities: No lower extremity edema. Pulses are  trace in the bilateral DP/PT.  EKG:  EKG is ordered today. The ekg ordered today demonstrates   Recent Labs: 07/14/2019: TSH 0.495 07/21/2019: ALT 17; BUN 14; Creatinine 0.95; Hemoglobin 11.8; Platelet Count 226; Potassium 4.6; Sodium 136   Lipid Panel    Component Value Date/Time   CHOL 114 07/22/2018 0925   TRIG 111 07/22/2018 0925   HDL 32 (L) 07/22/2018 0925   CHOLHDL 3.6 07/22/2018 0925   CHOLHDL 3.7 03/12/2016 0837   VLDL 17 03/12/2016 0837   LDLCALC 60 07/22/2018 0925     Wt Readings from Last 3 Encounters:  07/22/19 210 lb 12.8 oz (95.6 kg)  07/21/19 205 lb 11.2 oz (93.3 kg)  07/07/19 209 lb 1.6 oz (94.8 kg)     Other studies Reviewed: Additional studies/ records that were reviewed today include: . Review of the above records demonstrates:   Assessment and Plan:   1. CAD without angina: NSTEMI April 2017 with severe stenosis mid LAD treated with drug eluting stent. Mild to moderate non-obstrucitve disease in the RCA and Circumflex. No chest pain. Continue ASA, Plavix,  beta blocker and statin.    2. HTN: BP is well controlled. Continue current therapy  3. HLD: LDL at goal in February 2020. Will check lipids and LFTs today. Continue statin  4. PAD: No leg pain with walking. He will call with change in his symptoms.   5. Atrial flutter: Sinus today. Continue beta blocker. He does not wish to restart Eliquis due to cost and will not consider coumadin. If he has recurrence, will need to consider anti-coagulation.   6. Lung cancer: He started chemotherapy last week.   Current medicines are reviewed at length with the patient today.  The patient does not have concerns regarding medicines.  The following changes have been made:  no change  Labs/ tests  ordered today include:   Orders Placed This Encounter  Procedures  . Hepatic function panel  . Lipid panel     Disposition:   FU with me in 12 months   Signed, Lauree Chandler, MD 07/22/2019 8:47 AM     [facility] [address], [place], [place]  [address] Phone: [phone]; Fax: [phone]

## 2019-07-21 NOTE — Progress Notes (Signed)
[place] [phone]   [phone]  OFFICE PROGRESS NOTE  [name], FNP [name] [address]  DIAGNOSIS: Stage IV (TX, N3, M1c) non-small cell lung cancer, poorly differentiated adenocarcinoma presented with bulky mediastinal lymphadenopathy in addition to right axillary lymphadenopathy and metastatic liver lesions, abdominal lymphadenopathy diagnosed in January 2021. Molecular studies by [employer] 360 that shows no actionable mutations.  PRIOR THERAPY: None  CURRENT THERAPY: Systemic chemotherapy with carboplatin for AUC of 5, Alimta 500 mg/M2 and Keytruda 200 mg IV every 3 weeks.  First dose July 14, 2019.  INTERVAL HISTORY: Kirk [name] 64 y.o. male returns to the clinic today for follow-up visit.  The patient is feeling fine today with no concerning complaints except for fatigue for several days after his treatment.  He denied having any current chest pain, cough or hemoptysis.  He denied having any fever or chills.  He has no nausea, vomiting, diarrhea or constipation.  He has no headache or visual changes.  He is supposed to have MRI of the brain early next week.  He received the first dose of Covid vaccine and he is expected to receive the second 1 next week. The patient is here today for evaluation and repeat blood work.   MEDICAL HISTORY: Past Medical History:  Diagnosis Date   Arthritis    "right knee" (09/04/2015)   Atrial flutter with rapid ventricular response ([place]) 04/19/2019   Dyspnea    increased exertion   HTN (hypertension)    Hyperlipidemia    NSTEMI (non-ST elevated myocardial infarction) ([place]) 09/04/2015   a. cath 09/05/2015: 95% stenosis mid-LAD, 55% mid-RCA, and 40% prox-RCA. PCI performed w/ Synergy DES to LAD   PAD (peripheral artery disease) (HCC)    Stenting of bilateral iliacs in 2003.   Refusal of blood transfusions as patient is Jehovah's Witness    Type II diabetes  mellitus ([place])     ALLERGIES:  has No Known Allergies.  MEDICATIONS:  Current Outpatient Medications  Medication Sig Dispense Refill   acetaminophen (TYLENOL) 500 MG tablet Take 500 mg by mouth every 6 (six) hours as needed for fever.     apixaban (ELIQUIS) 5 MG TABS tablet Take 1 tablet (5 mg total) by mouth 2 (two) times daily. 60 tablet 1   aspirin EC 81 MG tablet Take 81 mg by mouth at bedtime.     carvedilol (COREG) 12.5 MG tablet Take 1 tablet (12.5 mg total) by mouth 2 (two) times daily with a meal. 180 tablet 3   cetirizine (ZYRTEC) 10 MG tablet Take 1 tablet (10 mg total) by mouth daily. 30 tablet 11   clopidogrel (PLAVIX) 75 MG tablet Take 75 mg by mouth daily.     folic acid (FOLVITE) 1 MG tablet Take 1 tablet (1 mg total) by mouth daily. 30 tablet 4   glimepiride (AMARYL) 4 MG tablet Take 1 tablet (4 mg total) by mouth daily. 90 tablet 1   HYDROcodone-homatropine (HYCODAN) 5-1.5 MG/5ML syrup Take 5 mLs by mouth every 8 (eight) hours as needed for cough. 120 mL 0   Hydrocortisone Acetate 1 % CREA Apply 15 g topically 2 (two) times daily. 15 g 3   ibuprofen (ADVIL) 200 MG tablet Take 600 mg by mouth once.     ipratropium (ATROVENT) 0.03 % nasal spray USE 2 SPRAYS IN EACH NOSTRIL TWICE DAILY 30 mL 2   lidocaine-prilocaine (EMLA) cream Apply to the Port-A-Cath site  30-60 minutes before treatment. 30 g 0   lisinopril (ZESTRIL) 10 MG tablet Take 1 tablet (10 mg total) by mouth daily. 90 tablet 1   metFORMIN (GLUCOPHAGE) 1000 MG tablet Take 1 tablet (1,000 mg total) by mouth 2 (two) times daily with a meal. 180 tablet 1   methylPREDNISolone (MEDROL) 4 MG TBPK tablet Take per package directions. 21 tablet 0   multivitamin (ONE-A-DAY MEN'S) TABS tablet Take 1 tablet by mouth daily. 30 tablet 6   nitroGLYCERIN (NITROSTAT) 0.4 MG SL tablet Place 1 tablet (0.4 mg total) under the tongue every 5 (five) minutes x 3 doses as needed for chest pain. 25 tablet 2    oxyCODONE-acetaminophen (PERCOCET/ROXICET) 5-325 MG tablet Take 1 tablet by mouth every 6 (six) hours as needed for severe pain. 30 tablet 0   pantoprazole (PROTONIX) 40 MG tablet TAKE 1 TABLET BY MOUTH EVERY DAY 30 tablet 5   prochlorperazine (COMPAZINE) 10 MG tablet Take 1 tablet (10 mg total) by mouth every 6 (six) hours as needed for nausea or vomiting. 30 tablet 0   rosuvastatin (CRESTOR) 40 MG tablet TAKE 1 TABLET BY MOUTH DAILY 90 tablet 0   sucralfate (CARAFATE) 1 GM/10ML suspension Take 10 mLs (1 g total) by mouth 4 (four) times daily. 420 mL 1   No current facility-administered medications for this visit.    SURGICAL HISTORY:  Past Surgical History:  Procedure Laterality Date   CARDIAC CATHETERIZATION N/A 09/05/2015   Procedure: Left Heart Cath and Coronary Angiography;  Surgeon: Troy Sine, MD;  Location: [place] [facility];  Service: Cardiovascular;  Laterality: N/A;   CARDIAC CATHETERIZATION N/A 09/05/2015   Procedure: Coronary Stent Intervention;  Surgeon: Troy Sine, MD;  Location: [place] [facility];  Service: Cardiovascular;  Laterality: N/A;   IR IMAGING GUIDED PORT INSERTION  07/15/2019   ORIF CONGENITAL HIP DISLOCATION Left ~ 1965   "put pins in it"   PERIPHERAL VASCULAR CATHETERIZATION Bilateral 2003   "stenting"   TONSILLECTOMY  ~ [place]:  A comprehensive review of systems was negative except for: Constitutional: positive for fatigue   PHYSICAL EXAMINATION: General appearance: alert, cooperative, appears stated age, fatigued and no distress Head: Normocephalic, without obvious abnormality, atraumatic Neck: no adenopathy, no JVD, supple, symmetrical, trachea midline and thyroid not enlarged, symmetric, no tenderness/mass/nodules Lymph nodes: Cervical, supraclavicular, and axillary nodes normal. Resp: clear to auscultation bilaterally Back: symmetric, no curvature. ROM normal. No CVA tenderness. Cardio: regular rate and rhythm, S1,  S2 normal, no murmur, click, rub or gallop GI: soft, non-tender; bowel sounds normal; no masses,  no organomegaly Extremities: extremities normal, atraumatic, no cyanosis or edema  ECOG PERFORMANCE STATUS: 1 - Symptomatic but completely ambulatory  Blood pressure 125/63, pulse (!) 102, temperature 97.9 F (36.6 C), temperature source Temporal, resp. rate 18, height 6\' 3"  (1.905 m), weight 205 lb 11.2 oz (93.3 kg), SpO2 99 %.  LABORATORY DATA: Lab Results  Component Value Date   WBC 8.7 07/14/2019   HGB 12.1 (L) 07/14/2019   HCT 37.6 (L) 07/14/2019   MCV 85.3 07/14/2019   PLT 368 07/14/2019      Chemistry      Component Value Date/Time   NA 136 07/14/2019 1059   NA 140 06/08/2015 0840   K 4.9 07/14/2019 1059   CL 101 07/14/2019 1059   CO2 27 07/14/2019 1059   BUN 15 07/14/2019 1059   BUN 15 06/08/2015 0840   CREATININE 1.04 07/14/2019  1059   CREATININE 1.06 10/01/2016 0855      Component Value Date/Time   CALCIUM 9.7 07/14/2019 1059   ALKPHOS 69 07/14/2019 1059   AST 13 (L) 07/14/2019 1059   ALT 14 07/14/2019 1059   BILITOT 0.3 07/14/2019 1059       RADIOGRAPHIC STUDIES: [place] CORE BIOPSY (LYMPH NODES)  Result Date: 06/24/2019 INDICATION: 64 year old male with suspected metastatic disease of uncertain primary etiology. Small cell lung cancer is suspected. Patient presents for ultrasound-guided core biopsy of supraclavicular lymph node. EXAM: ULTRASOUND OF THE LYMPH NODES MEDICATIONS: None. ANESTHESIA/SEDATION: Moderate (conscious) sedation was employed during this procedure. A total of Versed to mg and Fentanyl 100 mcg was administered intravenously. Moderate Sedation Time: 14 minutes. The patient's level of consciousness and vital signs were monitored continuously by radiology nursing throughout the procedure under my direct supervision. FLUOROSCOPY TIME:  None. COMPLICATIONS: None immediate. PROCEDURE: Informed written consent was obtained from the patient after a thorough  discussion of the procedural risks, benefits and alternatives. All questions were addressed. Maximal Sterile Barrier Technique was utilized including caps, mask, sterile gowns, sterile gloves, sterile drape, hand hygiene and skin antiseptic. A timeout was performed prior to the initiation of the procedure. Ultrasound was used to interrogate the left supraclavicular fossa. A large lobulated lymph node measuring 3.2 x 1.4 cm is successfully identified. This lymph node is both enlarged and morphologically abnormal. The overlying skin was sterilely prepped and draped in the standard fashion using chlorhexidine skin prep. Local anesthesia was attained by infiltration with 1% lidocaine. A small dermatotomy was made. Under real-time ultrasound guidance, multiple 18 gauge core biopsies were obtained using the Bard mission automated biopsy device. Biopsy specimens were placed in formalin and delivered to pathology for further analysis. Post biopsy ultrasound imaging demonstrates no evidence of hemorrhage or other complication. The patient tolerated the procedure well. IMPRESSION: Successful ultrasound-guided core biopsy of left supraclavicular lymphadenopathy. Electronically Signed   By: Jacqulynn Cadet M.D.   On: 06/24/2019 18:55   IR IMAGING GUIDED PORT INSERTION  Result Date: 07/15/2019 CLINICAL DATA:  Metastatic adenocarcinoma of the lung and need for porta cath for chemotherapy. EXAM: IMPLANTED PORT A CATH PLACEMENT WITH ULTRASOUND AND FLUOROSCOPIC GUIDANCE ANESTHESIA/SEDATION: 4.0 mg IV Versed; 100 mcg IV Fentanyl Total Moderate Sedation Time:  30 minutes The patient's level of consciousness and physiologic status were continuously monitored during the procedure by Radiology nursing. Additional Medications: 2 g IV Ancef. FLUOROSCOPY TIME:  18 seconds.  3.8 mGy. PROCEDURE: The procedure, risks, benefits, and alternatives were explained to the patient. Questions regarding the procedure were encouraged and answered.  The patient understands and consents to the procedure. A time-out was performed prior to initiating the procedure. Ultrasound was utilized to confirm patency of the right internal jugular vein. The right neck and chest were prepped with chlorhexidine in a sterile fashion, and a sterile drape was applied covering the operative field. Maximum barrier sterile technique with sterile gowns and gloves were used for the procedure. Local anesthesia was provided with 1% lidocaine. After creating a small venotomy incision, a 21 gauge needle was advanced into the right internal jugular vein under direct, real-time ultrasound guidance. Ultrasound image documentation was performed. After securing guidewire access, an 8 Fr dilator was placed. A J-wire was kinked to measure appropriate catheter length. A subcutaneous port pocket was then created along the upper chest wall utilizing sharp and blunt dissection. Portable cautery was utilized. The pocket was irrigated with sterile saline. A single lumen power injectable  port was chosen for placement. The 8 Fr catheter was tunneled from the port pocket site to the venotomy incision. The port was placed in the pocket. External catheter was trimmed to appropriate length based on guidewire measurement. At the venotomy, an 8 Fr peel-away sheath was placed over a guidewire. The catheter was then placed through the sheath and the sheath removed. Final catheter positioning was confirmed and documented with a fluoroscopic spot image. The port was accessed with a needle and aspirated and flushed with heparinized saline. The access needle was removed. The venotomy and port pocket incisions were closed with subcutaneous 3-0 Monocryl and subcuticular 4-0 Vicryl. Dermabond was applied to both incisions. COMPLICATIONS: COMPLICATIONS None FINDINGS: After catheter placement, the tip lies at the cavo-atrial junction. The catheter aspirates normally and is ready for immediate use. IMPRESSION: Placement  of single lumen port a cath via right internal jugular vein. The catheter tip lies at the cavo-atrial junction. A power injectable port a cath was placed and is ready for immediate use. Electronically Signed   By: Aletta Edouard M.D.   On: 07/15/2019 14:30    ASSESSMENT AND PLAN: This is a very pleasant 64 years old African-American male recently diagnosed with a stage IV (TX, N3, M1c) non-small cell lung cancer, poorly differentiated adenocarcinoma presented with bulky mediastinal as well as right hilar, supraclavicular, abdominal and right axillary lymphadenopathy in addition to metastatic liver lesions diagnosed in January 2021. Molecular studies by guardant 360 shows no actionable mutations.  The patient is not a candidate for treatment with targeted therapy or enrollment in the clinical trial with the [facility] regimen. The patient is currently undergoing systemic chemotherapy with carboplatin for AUC of 5, Alimta 500 mg/M2 and Keytruda 200 mg IV every 3 weeks status post 1 cycle started last week.  He tolerated the first cycle of his treatment well with no concerning adverse effects except for fatigue. I recommended for the patient to continue his treatment and he will start cycle #2 in 2 weeks. He will come back for follow-up visit at that time. For pain management, I will start the patient on Percocet 5/325 mg p.o. every 6 hours as needed. For constipation he was advised to take stool softener and also MiraLAX if needed. The patient was advised to call immediately if he has any concerning symptoms in the interval. The patient voices understanding of current disease status and treatment options and is in agreement with the current care plan.  All questions were answered. The patient knows to call the clinic with any problems, questions or concerns. We can certainly see the patient much sooner if necessary.  Disclaimer: This note was dictated with voice recognition software. Similar sounding  words can inadvertently be transcribed and may not be corrected upon review.

## 2019-07-21 NOTE — Progress Notes (Signed)
Scheduling message sent for flush appts. on 08/04/19 & 08/25/19.

## 2019-07-22 ENCOUNTER — Ambulatory Visit (INDEPENDENT_AMBULATORY_CARE_PROVIDER_SITE_OTHER): Payer: Medicare Other | Admitting: Cardiovascular Disease

## 2019-07-22 ENCOUNTER — Encounter: Payer: Self-pay | Admitting: Cardiovascular Disease

## 2019-07-22 ENCOUNTER — Telehealth: Payer: Self-pay | Admitting: Internal Medicine

## 2019-07-22 VITALS — BP 112/60 | HR 85 | Ht 75.0 in | Wt 210.8 lb

## 2019-07-22 DIAGNOSIS — I739 Peripheral vascular disease, unspecified: Secondary | ICD-10-CM

## 2019-07-22 DIAGNOSIS — E7800 Pure hypercholesterolemia, unspecified: Secondary | ICD-10-CM

## 2019-07-22 DIAGNOSIS — I4892 Unspecified atrial flutter: Secondary | ICD-10-CM

## 2019-07-22 DIAGNOSIS — I2510 Atherosclerotic heart disease of native coronary artery without angina pectoris: Secondary | ICD-10-CM

## 2019-07-22 DIAGNOSIS — I10 Essential (primary) hypertension: Secondary | ICD-10-CM | POA: Diagnosis not present

## 2019-07-22 LAB — HEPATIC FUNCTION PANEL
ALT: 16 IU/L (ref 0–44)
AST: 15 IU/L (ref 0–40)
Albumin: 4.4 g/dL (ref 3.8–4.8)
Alkaline Phosphatase: 76 IU/L (ref 39–117)
Bilirubin Total: 0.2 mg/dL (ref 0.0–1.2)
Bilirubin, Direct: 0.09 mg/dL (ref 0.00–0.40)
Total Protein: 7.5 g/dL (ref 6.0–8.5)

## 2019-07-22 LAB — LIPID PANEL
Chol/HDL Ratio: 3 ratio (ref 0.0–5.0)
Cholesterol, Total: 102 mg/dL (ref 100–199)
HDL: 34 mg/dL — ABNORMAL LOW (ref >39)
LDL Chol Calc (NIH): 48 mg/dL (ref 0–99)
Triglycerides: 106 mg/dL (ref 0–149)
VLDL Cholesterol Cal: 20 mg/dL (ref 5–40)

## 2019-07-22 MED ORDER — CLOPIDOGREL BISULFATE 75 MG PO TABS: 75.0000 mg | ORAL_TABLET | Freq: Every day | ORAL | 3 refills | Status: DC

## 2019-07-22 NOTE — Telephone Encounter (Signed)
Scheduled per los. Called and spoke with patient. Confirmed appt 

## 2019-07-22 NOTE — Patient Instructions (Signed)
Medication Instructions:  Your physician has recommended you make the following change in your medication:  1.) stop apixaban (Eliquis)  *If you need a refill on your cardiac medications before your next appointment, please call your pharmacy*  Lab Work: Today: lipid/liver function If you have labs (blood work) drawn today and your tests are completely normal, you will receive your results only by: [name] MyChart Message (if you have MyChart) OR . A paper copy in the mail If you have any lab test that is abnormal or we need to change your treatment, we will call you to review the results.  Testing/Procedures: none  Follow-Up: At [facility], you and your health needs are our priority.  As part of our continuing mission to provide you with exceptional heart care, we have created designated Provider Care Teams.  These Care Teams include your primary Cardiologist (physician) and Advanced Practice Providers (APPs -  Physician Assistants and Nurse Practitioners) who all work together to provide you with the care you need, when you need it.  Your next appointment:   12 month(s)  The format for your next appointment:   In Person  Provider:   You may see Lauree Chandler, MD or one of the following Advanced Practice Providers on your designated Care Team:    Melina Copa, PA-C  Ermalinda Barrios, PA-C   Other Instructions

## 2019-07-26 ENCOUNTER — Ambulatory Visit (HOSPITAL_COMMUNITY)
Admission: RE | Admit: 2019-07-26 | Discharge: 2019-07-26 | Disposition: A | Discharge status: Home / Self Care | Payer: Medicare Other | Source: Ambulatory Visit | Attending: Internal Medicine | Admitting: Internal Medicine

## 2019-07-26 ENCOUNTER — Other Ambulatory Visit: Payer: Self-pay

## 2019-07-26 DIAGNOSIS — C3490 Malignant neoplasm of unspecified part of unspecified bronchus or lung: Secondary | ICD-10-CM | POA: Diagnosis present

## 2019-07-26 MED ORDER — GADOBUTROL 1 MMOL/ML IV SOLN
10.0000 mL | Freq: Once | INTRAVENOUS | Status: AC | PRN
  Administered 2019-07-26: 10 mL via INTRAVENOUS

## 2019-07-27 ENCOUNTER — Other Ambulatory Visit: Payer: Self-pay | Admitting: Internal Medicine

## 2019-07-28 ENCOUNTER — Inpatient Hospital Stay: Payer: Medicare Other | Attending: Internal Medicine

## 2019-07-28 ENCOUNTER — Inpatient Hospital Stay: Payer: Medicare Other

## 2019-07-28 ENCOUNTER — Other Ambulatory Visit: Payer: Self-pay

## 2019-07-28 DIAGNOSIS — E785 Hyperlipidemia, unspecified: Secondary | ICD-10-CM | POA: Insufficient documentation

## 2019-07-28 DIAGNOSIS — Z7982 Long term (current) use of aspirin: Secondary | ICD-10-CM | POA: Diagnosis not present

## 2019-07-28 DIAGNOSIS — K5900 Constipation, unspecified: Secondary | ICD-10-CM | POA: Insufficient documentation

## 2019-07-28 DIAGNOSIS — Z791 Long term (current) use of non-steroidal anti-inflammatories (NSAID): Secondary | ICD-10-CM | POA: Insufficient documentation

## 2019-07-28 DIAGNOSIS — C787 Secondary malignant neoplasm of liver and intrahepatic bile duct: Secondary | ICD-10-CM | POA: Insufficient documentation

## 2019-07-28 DIAGNOSIS — Z7984 Long term (current) use of oral hypoglycemic drugs: Secondary | ICD-10-CM | POA: Diagnosis not present

## 2019-07-28 DIAGNOSIS — Z79899 Other long term (current) drug therapy: Secondary | ICD-10-CM | POA: Diagnosis not present

## 2019-07-28 DIAGNOSIS — I252 Old myocardial infarction: Secondary | ICD-10-CM | POA: Diagnosis not present

## 2019-07-28 DIAGNOSIS — C3491 Malignant neoplasm of unspecified part of right bronchus or lung: Secondary | ICD-10-CM | POA: Diagnosis not present

## 2019-07-28 DIAGNOSIS — R5383 Other fatigue: Secondary | ICD-10-CM | POA: Insufficient documentation

## 2019-07-28 DIAGNOSIS — Z5112 Encounter for antineoplastic immunotherapy: Secondary | ICD-10-CM | POA: Diagnosis present

## 2019-07-28 DIAGNOSIS — I10 Essential (primary) hypertension: Secondary | ICD-10-CM | POA: Diagnosis not present

## 2019-07-28 DIAGNOSIS — E1151 Type 2 diabetes mellitus with diabetic peripheral angiopathy without gangrene: Secondary | ICD-10-CM | POA: Insufficient documentation

## 2019-07-28 DIAGNOSIS — Z7902 Long term (current) use of antithrombotics/antiplatelets: Secondary | ICD-10-CM | POA: Diagnosis not present

## 2019-07-28 DIAGNOSIS — M1990 Unspecified osteoarthritis, unspecified site: Secondary | ICD-10-CM | POA: Diagnosis not present

## 2019-07-28 DIAGNOSIS — C3490 Malignant neoplasm of unspecified part of unspecified bronchus or lung: Secondary | ICD-10-CM

## 2019-07-28 DIAGNOSIS — Z5111 Encounter for antineoplastic chemotherapy: Secondary | ICD-10-CM | POA: Insufficient documentation

## 2019-07-28 DIAGNOSIS — Z95828 Presence of other vascular implants and grafts: Secondary | ICD-10-CM

## 2019-07-28 LAB — CBC WITH DIFFERENTIAL (CANCER CENTER ONLY)
Abs Immature Granulocytes: 0 10*3/uL (ref 0.00–0.07)
Basophils Absolute: 0 10*3/uL (ref 0.0–0.1)
Basophils Relative: 1 %
Eosinophils Absolute: 0.1 10*3/uL (ref 0.0–0.5)
Eosinophils Relative: 4 %
HCT: 31.8 % — ABNORMAL LOW (ref 39.0–52.0)
Hemoglobin: 10.2 g/dL — ABNORMAL LOW (ref 13.0–17.0)
Immature Granulocytes: 0 %
Lymphocytes Relative: 63 %
Lymphs Abs: 2 10*3/uL (ref 0.7–4.0)
MCH: 27.9 pg (ref 26.0–34.0)
MCHC: 32.1 g/dL (ref 30.0–36.0)
MCV: 86.9 fL (ref 80.0–100.0)
Monocytes Absolute: 0.4 10*3/uL (ref 0.1–1.0)
Monocytes Relative: 12 %
Neutro Abs: 0.6 10*3/uL — ABNORMAL LOW (ref 1.7–7.7)
Neutrophils Relative %: 20 %
Platelet Count: 170 10*3/uL (ref 150–400)
RBC: 3.66 MIL/uL — ABNORMAL LOW (ref 4.22–5.81)
RDW: 12.8 % (ref 11.5–15.5)
WBC Count: 3.2 10*3/uL — ABNORMAL LOW (ref 4.0–10.5)
nRBC: 0 % (ref 0.0–0.2)

## 2019-07-28 LAB — CMP (CANCER CENTER ONLY)
ALT: 20 U/L (ref 0–44)
AST: 15 U/L (ref 15–41)
Albumin: 3.6 g/dL (ref 3.5–5.0)
Alkaline Phosphatase: 76 U/L (ref 38–126)
Anion gap: 8 (ref 5–15)
BUN: 10 mg/dL (ref 8–23)
CO2: 25 mmol/L (ref 22–32)
Calcium: 8.8 mg/dL — ABNORMAL LOW (ref 8.9–10.3)
Chloride: 107 mmol/L (ref 98–111)
Creatinine: 0.88 mg/dL (ref 0.61–1.24)
GFR, Est AFR Am: >60 mL/min (ref >60)
GFR, Estimated: >60 mL/min (ref >60)
Glucose, Bld: 146 mg/dL — ABNORMAL HIGH (ref 70–99)
Potassium: 4.1 mmol/L (ref 3.5–5.1)
Sodium: 140 mmol/L (ref 135–145)
Total Bilirubin: <0.2 mg/dL — ABNORMAL LOW (ref 0.3–1.2)
Total Protein: 7.5 g/dL (ref 6.5–8.1)

## 2019-07-28 MED ORDER — SODIUM CHLORIDE 0.9% FLUSH
10.0000 mL | INTRAVENOUS | Status: DC | PRN
  Administered 2019-07-28: 10 mL via INTRAVENOUS
  Filled 2019-07-28: qty 10

## 2019-07-28 MED ORDER — HEPARIN SOD (PORK) LOCK FLUSH 100 UNIT/ML IV SOLN
500.0000 [IU] | Freq: Once | INTRAVENOUS | Status: AC
  Administered 2019-07-28: 500 [IU] via INTRAVENOUS
  Filled 2019-07-28: qty 5

## 2019-07-31 ENCOUNTER — Other Ambulatory Visit: Payer: Self-pay | Admitting: Physician Assistant

## 2019-08-02 ENCOUNTER — Encounter: Payer: Self-pay | Admitting: Internal Medicine

## 2019-08-03 ENCOUNTER — Other Ambulatory Visit: Payer: Self-pay | Admitting: Medical Oncology

## 2019-08-04 ENCOUNTER — Inpatient Hospital Stay: Payer: Medicare Other

## 2019-08-04 ENCOUNTER — Other Ambulatory Visit: Payer: Self-pay

## 2019-08-04 ENCOUNTER — Encounter: Payer: Self-pay | Admitting: Internal Medicine

## 2019-08-04 ENCOUNTER — Inpatient Hospital Stay (HOSPITAL_BASED_OUTPATIENT_CLINIC_OR_DEPARTMENT_OTHER): Payer: Medicare Other | Admitting: Internal Medicine

## 2019-08-04 VITALS — BP 133/63 | HR 91 | Temp 98.2°F | Resp 18 | Ht 75.0 in | Wt 211.9 lb

## 2019-08-04 DIAGNOSIS — I10 Essential (primary) hypertension: Secondary | ICD-10-CM

## 2019-08-04 DIAGNOSIS — C787 Secondary malignant neoplasm of liver and intrahepatic bile duct: Secondary | ICD-10-CM

## 2019-08-04 DIAGNOSIS — R5382 Chronic fatigue, unspecified: Secondary | ICD-10-CM

## 2019-08-04 DIAGNOSIS — C3490 Malignant neoplasm of unspecified part of unspecified bronchus or lung: Secondary | ICD-10-CM

## 2019-08-04 DIAGNOSIS — Z5111 Encounter for antineoplastic chemotherapy: Secondary | ICD-10-CM

## 2019-08-04 DIAGNOSIS — C3491 Malignant neoplasm of unspecified part of right bronchus or lung: Secondary | ICD-10-CM | POA: Diagnosis not present

## 2019-08-04 DIAGNOSIS — Z95828 Presence of other vascular implants and grafts: Secondary | ICD-10-CM

## 2019-08-04 DIAGNOSIS — I2510 Atherosclerotic heart disease of native coronary artery without angina pectoris: Secondary | ICD-10-CM | POA: Diagnosis not present

## 2019-08-04 DIAGNOSIS — Z5112 Encounter for antineoplastic immunotherapy: Secondary | ICD-10-CM

## 2019-08-04 LAB — CMP (CANCER CENTER ONLY)
ALT: 21 U/L (ref 0–44)
AST: 21 U/L (ref 15–41)
Albumin: 3.8 g/dL (ref 3.5–5.0)
Alkaline Phosphatase: 68 U/L (ref 38–126)
Anion gap: 10 (ref 5–15)
BUN: 10 mg/dL (ref 8–23)
CO2: 27 mmol/L (ref 22–32)
Calcium: 9.2 mg/dL (ref 8.9–10.3)
Chloride: 103 mmol/L (ref 98–111)
Creatinine: 0.89 mg/dL (ref 0.61–1.24)
GFR, Est AFR Am: >60 mL/min (ref >60)
GFR, Estimated: >60 mL/min (ref >60)
Glucose, Bld: 171 mg/dL — ABNORMAL HIGH (ref 70–99)
Potassium: 4.3 mmol/L (ref 3.5–5.1)
Sodium: 140 mmol/L (ref 135–145)
Total Bilirubin: <0.2 mg/dL — ABNORMAL LOW (ref 0.3–1.2)
Total Protein: 7.8 g/dL (ref 6.5–8.1)

## 2019-08-04 LAB — CBC WITH DIFFERENTIAL (CANCER CENTER ONLY)
Abs Immature Granulocytes: 0.01 10*3/uL (ref 0.00–0.07)
Basophils Absolute: 0.1 10*3/uL (ref 0.0–0.1)
Basophils Relative: 1 %
Eosinophils Absolute: 0.1 10*3/uL (ref 0.0–0.5)
Eosinophils Relative: 2 %
HCT: 33.6 % — ABNORMAL LOW (ref 39.0–52.0)
Hemoglobin: 10.5 g/dL — ABNORMAL LOW (ref 13.0–17.0)
Immature Granulocytes: 0 %
Lymphocytes Relative: 65 %
Lymphs Abs: 3 10*3/uL (ref 0.7–4.0)
MCH: 27.7 pg (ref 26.0–34.0)
MCHC: 31.3 g/dL (ref 30.0–36.0)
MCV: 88.7 fL (ref 80.0–100.0)
Monocytes Absolute: 0.6 10*3/uL (ref 0.1–1.0)
Monocytes Relative: 13 %
Neutro Abs: 0.9 10*3/uL — ABNORMAL LOW (ref 1.7–7.7)
Neutrophils Relative %: 19 %
Platelet Count: 463 10*3/uL — ABNORMAL HIGH (ref 150–400)
RBC: 3.79 MIL/uL — ABNORMAL LOW (ref 4.22–5.81)
RDW: 14.4 % (ref 11.5–15.5)
WBC Count: 4.6 10*3/uL (ref 4.0–10.5)
nRBC: 0 % (ref 0.0–0.2)

## 2019-08-04 LAB — TSH: TSH: 0.572 u[IU]/mL (ref 0.320–4.118)

## 2019-08-04 MED ORDER — HEPARIN SOD (PORK) LOCK FLUSH 100 UNIT/ML IV SOLN
500.0000 [IU] | Freq: Once | INTRAVENOUS | Status: DC
  Filled 2019-08-04: qty 5

## 2019-08-04 MED ORDER — SODIUM CHLORIDE 0.9 % IV SOLN
Freq: Once | INTRAVENOUS | Status: AC
  Filled 2019-08-04: qty 250

## 2019-08-04 MED ORDER — DEXAMETHASONE SODIUM PHOSPHATE 10 MG/ML IJ SOLN
10.0000 mg | Freq: Once | INTRAMUSCULAR | Status: AC
  Administered 2019-08-04: 10 mg via INTRAVENOUS

## 2019-08-04 MED ORDER — HEPARIN SOD (PORK) LOCK FLUSH 100 UNIT/ML IV SOLN
500.0000 [IU] | Freq: Once | INTRAVENOUS | Status: AC | PRN
  Administered 2019-08-04: 500 [IU]
  Filled 2019-08-04: qty 5

## 2019-08-04 MED ORDER — SODIUM CHLORIDE 0.9 % IV SOLN
200.0000 mg | Freq: Once | INTRAVENOUS | Status: AC
  Administered 2019-08-04: 200 mg via INTRAVENOUS
  Filled 2019-08-04: qty 8

## 2019-08-04 MED ORDER — PALONOSETRON HCL INJECTION 0.25 MG/5ML
INTRAVENOUS | Status: AC
  Filled 2019-08-04: qty 5

## 2019-08-04 MED ORDER — PALONOSETRON HCL INJECTION 0.25 MG/5ML
0.2500 mg | Freq: Once | INTRAVENOUS | Status: AC
  Administered 2019-08-04: 0.25 mg via INTRAVENOUS

## 2019-08-04 MED ORDER — SODIUM CHLORIDE 0.9 % IV SOLN
400.0000 mg/m2 | Freq: Once | INTRAVENOUS | Status: AC
  Administered 2019-08-04: 900 mg via INTRAVENOUS
  Filled 2019-08-04: qty 20

## 2019-08-04 MED ORDER — SODIUM CHLORIDE 0.9 % IV SOLN
500.0000 mg | Freq: Once | INTRAVENOUS | Status: AC
  Administered 2019-08-04: 500 mg via INTRAVENOUS
  Filled 2019-08-04: qty 50

## 2019-08-04 MED ORDER — DEXAMETHASONE SODIUM PHOSPHATE 10 MG/ML IJ SOLN
INTRAMUSCULAR | Status: AC
  Filled 2019-08-04: qty 1

## 2019-08-04 MED ORDER — SODIUM CHLORIDE 0.9% FLUSH
10.0000 mL | INTRAVENOUS | Status: DC | PRN
  Administered 2019-08-04: 10 mL
  Filled 2019-08-04: qty 10

## 2019-08-04 MED ORDER — SODIUM CHLORIDE 0.9% FLUSH
10.0000 mL | INTRAVENOUS | Status: DC | PRN
  Administered 2019-08-04: 10 mL via INTRAVENOUS
  Filled 2019-08-04: qty 10

## 2019-08-04 MED ORDER — SODIUM CHLORIDE 0.9 % IV SOLN
150.0000 mg | Freq: Once | INTRAVENOUS | Status: AC
  Administered 2019-08-04: 150 mg via INTRAVENOUS
  Filled 2019-08-04: qty 150

## 2019-08-04 NOTE — Progress Notes (Signed)
Met with patient in lobby to introduce myself as Financial Resource Specialist and to offer available resources. ° °Discussed one-time $1000 [employer] grant and qualifications to assist with personal expenses while going through treatment. ° °Gave him my card if interested in applying and for any additional financial questions or concerns. °

## 2019-08-04 NOTE — Progress Notes (Signed)
Ok to tx with ANC = 900. Decrease alimta 400mg /m2 and carbo auc = 4

## 2019-08-04 NOTE — Progress Notes (Signed)
[place] Telephone:([phone]) [phone]   [phone]  OFFICE PROGRESS NOTE  [name], FNP [name] [address]  DIAGNOSIS: Stage IV (TX, N3, M1c) non-small cell lung cancer, poorly differentiated adenocarcinoma presented with bulky mediastinal lymphadenopathy in addition to right axillary lymphadenopathy and metastatic liver lesions, abdominal lymphadenopathy diagnosed in January 2021. Molecular studies by [employer] that shows no actionable mutations.  PRIOR THERAPY: None  CURRENT THERAPY: Systemic chemotherapy with carboplatin for AUC of 5, Alimta 500 mg/M2 and Keytruda 200 mg IV every 3 weeks.  First dose July 14, 2019.  INTERVAL HISTORY: Kirk [name] 63 y.o. male returns to the clinic today for follow-up visit.  The patient is feeling fine today with no concerning complaints.  He tolerated the first cycle of his treatment fairly well except for fatigue few days after the treatment.  He denied having any current nausea, vomiting, diarrhea or constipation.  He denied having any fever or chills.  He has no weight loss or night sweats.  He had MRI of the brain performed recently that showed no intraparenchymal disease in the brain but there was skull bone lesion around 7 mm in size. The patient is here today for evaluation before starting cycle #2 of his treatment.  MEDICAL HISTORY: Past Medical History:  Diagnosis Date  . Arthritis    "right knee" (09/04/2015)  . Atrial flutter with rapid ventricular response ([place]) 04/19/2019  . Dyspnea    increased exertion  . HTN (hypertension)   . Hyperlipidemia   . NSTEMI (non-ST elevated myocardial infarction) ([place]) 09/04/2015   a. cath 09/05/2015: 95% stenosis mid-LAD, 55% mid-RCA, and 40% prox-RCA. PCI performed w/ Synergy DES to LAD  . PAD (peripheral artery disease) ([facility])    Stenting of bilateral iliacs in 2003.  [name] Refusal of blood transfusions as patient is Jehovah's Witness   .  Type II diabetes mellitus ([facility])     ALLERGIES:  has No Known Allergies.  MEDICATIONS:  Current Outpatient Medications  Medication Sig Dispense Refill  . acetaminophen (TYLENOL) 500 MG tablet Take 500 mg by mouth every 6 (six) hours as needed for fever.    [name] aspirin EC 81 MG tablet Take 81 mg by mouth at bedtime.    . carvedilol (COREG) 12.5 MG tablet Take 1 tablet (12.5 mg total) by mouth 2 (two) times daily with a meal. 180 tablet 3  . cetirizine (ZYRTEC) 10 MG tablet Take 1 tablet (10 mg total) by mouth daily. 30 tablet 11  . clopidogrel (PLAVIX) 75 MG tablet Take 1 tablet (75 mg total) by mouth daily. 90 tablet 3  . folic acid (FOLVITE) 1 MG tablet Take 1 tablet (1 mg total) by mouth daily. 30 tablet 4  . glimepiride (AMARYL) 4 MG tablet Take 1 tablet (4 mg total) by mouth daily. 90 tablet 1  . HYDROcodone-homatropine (HYCODAN) 5-1.5 MG/5ML syrup Take 5 mLs by mouth every 8 (eight) hours as needed for cough. 120 mL 0  . Hydrocortisone Acetate 1 % CREA Apply 15 g topically 2 (two) times daily. 15 g 3  . ibuprofen (ADVIL) 200 MG tablet Take 600 mg by mouth once.    [name] ipratropium (ATROVENT) 0.03 % nasal spray USE 2 SPRAYS IN EACH NOSTRIL TWICE DAILY 30 mL 2  . lidocaine-prilocaine (EMLA) cream APPLY TO THE PORT-A-CATH SITE 30-60 MINUTES BEFORE TREATMENT. 30 g 0  . lisinopril (ZESTRIL) 10 MG tablet Take 1 tablet (10 mg total)  by mouth daily. 90 tablet 1  . metFORMIN (GLUCOPHAGE) 1000 MG tablet Take 1 tablet (1,000 mg total) by mouth 2 (two) times daily with a meal. 180 tablet 1  . multivitamin (ONE-A-DAY MEN'S) TABS tablet Take 1 tablet by mouth daily. 30 tablet 6  . nitroGLYCERIN (NITROSTAT) 0.4 MG SL tablet Place 1 tablet (0.4 mg total) under the tongue every 5 (five) minutes x 3 doses as needed for chest pain. 25 tablet 2  . oxyCODONE-acetaminophen (PERCOCET/ROXICET) 5-325 MG tablet Take 1 tablet by mouth every 6 (six) hours as needed for severe pain. 30 tablet 0  . pantoprazole (PROTONIX)  40 MG tablet TAKE 1 TABLET BY MOUTH EVERY DAY 30 tablet 5  . prochlorperazine (COMPAZINE) 10 MG tablet Take 1 tablet (10 mg total) by mouth every 6 (six) hours as needed for nausea or vomiting. 30 tablet 0  . rosuvastatin (CRESTOR) 40 MG tablet TAKE 1 TABLET BY MOUTH DAILY 90 tablet 0  . sucralfate (CARAFATE) 1 GM/10ML suspension Take 10 mLs (1 g total) by mouth 4 (four) times daily. 420 mL 1   No current facility-administered medications for this visit.    SURGICAL HISTORY:  Past Surgical History:  Procedure Laterality Date  . CARDIAC CATHETERIZATION N/A 09/05/2015   Procedure: Left Heart Cath and Coronary Angiography;  Surgeon: Troy Sine, MD;  Location: [facility];  Service: Cardiovascular;  Laterality: N/A;  . CARDIAC CATHETERIZATION N/A 09/05/2015   Procedure: Coronary Stent Intervention;  Surgeon: Troy Sine, MD;  Location: [facility] [facility];  Service: Cardiovascular;  Laterality: N/A;  . IR IMAGING GUIDED PORT INSERTION  07/15/2019  . 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 PERIPHERAL VASCULAR CATHETERIZATION Bilateral 2003   "stenting"  . TONSILLECTOMY  ~ 1963    REVIEW OF SYSTEMS:  A comprehensive review of systems was negative except for: Constitutional: positive for fatigue   PHYSICAL EXAMINATION: General appearance: alert, cooperative, appears stated age, fatigued and no distress Head: Normocephalic, without obvious abnormality, atraumatic Neck: no adenopathy, no JVD, supple, symmetrical, trachea midline and thyroid not enlarged, symmetric, no tenderness/mass/nodules Lymph nodes: Cervical, supraclavicular, and axillary nodes normal. Resp: clear to auscultation bilaterally Back: symmetric, no curvature. ROM normal. No CVA tenderness. Cardio: regular rate and rhythm, S1, S2 normal, no murmur, click, rub or gallop GI: soft, non-tender; bowel sounds normal; no masses,  no organomegaly Extremities: extremities normal, atraumatic, no  cyanosis or edema  ECOG PERFORMANCE STATUS: 1 - Symptomatic but completely ambulatory  Blood pressure 133/63, pulse 91, temperature 98.2 F (36.8 C), temperature source Oral, resp. rate 18, height 6\' 3"  (1.905 m), weight 211 lb 14.4 oz (96.1 kg), SpO2 100 %.  LABORATORY DATA: Lab Results  Component Value Date   WBC 4.6 08/04/2019   HGB 10.5 (L) 08/04/2019   HCT 33.6 (L) 08/04/2019   MCV 88.7 08/04/2019   PLT 463 (H) 08/04/2019      Chemistry      Component Value Date/Time   NA 140 07/28/2019 0914   NA 140 06/08/2015 0840   K 4.1 07/28/2019 0914   CL 107 07/28/2019 0914   CO2 25 07/28/2019 0914   BUN 10 07/28/2019 0914   BUN 15 06/08/2015 0840   CREATININE 0.88 07/28/2019 0914   CREATININE 1.06 10/01/2016 0855      Component Value Date/Time   CALCIUM 8.8 (L) 07/28/2019 0914   ALKPHOS 76 07/28/2019 0914   AST 15 07/28/2019 0914  ALT 20 07/28/2019 0914   BILITOT <0.2 (L) 07/28/2019 0914       RADIOGRAPHIC STUDIES: MR BRAIN W WO CONTRAST  Result Date: 07/26/2019 CLINICAL DATA:  Non-small cell lung cancer, staging. EXAM: MRI HEAD WITHOUT AND WITH CONTRAST TECHNIQUE: Multiplanar, multiecho pulse sequences of the brain and surrounding structures were obtained without and with intravenous contrast. CONTRAST:  71mL GADAVIST GADOBUTROL 1 MMOL/ML IV SOLN COMPARISON:  None. FINDINGS: Brain: No acute infarct, hemorrhage, or mass lesion is present. No significant white matter lesions are present. The ventricles are of normal size. No significant extraaxial fluid collection is present. The brainstem and cerebellum are within normal limits. Postcontrast images demonstrate no pathologic enhancement. Vascular: Flow is present in the major intracranial arteries. Skull and upper cervical spine: A 7 mm enhancing lesion is present in the left parietal skull focal lesions are present. Skull base is within normal limits. Upper cervical spine is unremarkable. Sinuses/Orbits: The paranasal sinuses  and mastoid air cells are clear. The globes and orbits are within normal limits. IMPRESSION: 1. Normal MRI appearance of the brain. No evidence for metastatic disease to the brain or meninges. 2. 7 mm enhancing lesion in the left parietal skull is concerning for metastatic disease. Electronically Signed   By: San Morelle M.D.   On: 07/26/2019 07:50   IR IMAGING GUIDED PORT INSERTION  Result Date: 07/15/2019 CLINICAL DATA:  Metastatic adenocarcinoma of the lung and need for porta cath for chemotherapy. EXAM: IMPLANTED PORT A CATH PLACEMENT WITH ULTRASOUND AND FLUOROSCOPIC GUIDANCE ANESTHESIA/SEDATION: 4.0 mg IV Versed; 100 mcg IV Fentanyl Total Moderate Sedation Time:  30 minutes The patient's level of consciousness and physiologic status were continuously monitored during the procedure by Radiology nursing. Additional Medications: 2 g IV Ancef. FLUOROSCOPY TIME:  18 seconds.  3.8 mGy. PROCEDURE: The procedure, risks, benefits, and alternatives were explained to the patient. Questions regarding the procedure were encouraged and answered. The patient understands and consents to the procedure. A time-out was performed prior to initiating the procedure. Ultrasound was utilized to confirm patency of the right internal jugular vein. The right neck and chest were prepped with chlorhexidine in a sterile fashion, and a sterile drape was applied covering the operative field. Maximum barrier sterile technique with sterile gowns and gloves were used for the procedure. Local anesthesia was provided with 1% lidocaine. After creating a small venotomy incision, a 21 gauge needle was advanced into the right internal jugular vein under direct, real-time ultrasound guidance. Ultrasound image documentation was performed. After securing guidewire access, an 8 Fr dilator was placed. A J-wire was kinked to measure appropriate catheter length. A subcutaneous port pocket was then created along the upper chest wall utilizing  sharp and blunt dissection. Portable cautery was utilized. The pocket was irrigated with sterile saline. A single lumen power injectable port was chosen for placement. The 8 Fr catheter was tunneled from the port pocket site to the venotomy incision. The port was placed in the pocket. External catheter was trimmed to appropriate length based on guidewire measurement. At the venotomy, an 8 Fr peel-away sheath was placed over a guidewire. The catheter was then placed through the sheath and the sheath removed. Final catheter positioning was confirmed and documented with a fluoroscopic spot image. The port was accessed with a needle and aspirated and flushed with heparinized saline. The access needle was removed. The venotomy and port pocket incisions were closed with subcutaneous 3-0 Monocryl and subcuticular 4-0 Vicryl. Dermabond was applied to both incisions. COMPLICATIONS:  COMPLICATIONS None FINDINGS: After catheter placement, the tip lies at the cavo-atrial junction. The catheter aspirates normally and is ready for immediate use. IMPRESSION: Placement of single lumen port a cath via right internal jugular vein. The catheter tip lies at the cavo-atrial junction. A power injectable port a cath was placed and is ready for immediate use. Electronically Signed   By: Aletta Edouard M.D.   On: 07/15/2019 14:30    ASSESSMENT AND PLAN: This is a very pleasant 64 years old African-American male recently diagnosed with a stage IV (TX, N3, M1c) non-small cell lung cancer, poorly differentiated adenocarcinoma presented with bulky mediastinal as well as right hilar, supraclavicular, abdominal and right axillary lymphadenopathy in addition to metastatic liver lesions diagnosed in January 2021. Molecular studies by guardant 360 shows no actionable mutations.  The patient is not a candidate for treatment with targeted therapy or enrollment in the clinical trial with the [facility] regimen. The patient is currently undergoing  systemic chemotherapy with carboplatin for AUC of 5, Alimta 500 mg/M2 and Keytruda 200 mg IV every 3 weeks status post 1 cycle. He tolerated the first cycle of his treatment well except for fatigue few days after the treatment and now significantly improved. I recommended for the patient to proceed with cycle #2 today but I will reduce the dose of carboplatin to AUC of 4 and Alimta 400 mg/M2 for cycle #2 because of the neutropenia. For pain management, the patient will continue on Percocet 5/325 mg p.o. every 6 hours as needed. For constipation he will continue on MiraLAX on as-needed basis.   The patient voices understanding of current disease status and treatment options and is in agreement with the current care plan.  All questions were answered. The patient knows to call the clinic with any problems, questions or concerns. We can certainly see the patient much sooner if necessary.  Disclaimer: This note was dictated with voice recognition software. Similar sounding words can inadvertently be transcribed and may not be corrected upon review.

## 2019-08-04 NOTE — Patient Instructions (Signed)
[facility] Discharge Instructions for Patients Receiving Chemotherapy  Today you received the following chemotherapy agents: pembrolizumab, pemetrexed, carboplatin.  To help prevent nausea and vomiting after your treatment, we encourage you to take your nausea medication as directed.   If you develop nausea and vomiting that is not controlled by your nausea medication, call the clinic.   BELOW ARE SYMPTOMS THAT SHOULD BE REPORTED IMMEDIATELY:  *FEVER GREATER THAN 100.5 F  *CHILLS WITH OR WITHOUT FEVER  NAUSEA AND VOMITING THAT IS NOT CONTROLLED WITH YOUR NAUSEA MEDICATION  *UNUSUAL SHORTNESS OF BREATH  *UNUSUAL BRUISING OR BLEEDING  TENDERNESS IN MOUTH AND THROAT WITH OR WITHOUT PRESENCE OF ULCERS  *URINARY PROBLEMS  *BOWEL PROBLEMS  UNUSUAL RASH Items with * indicate a potential emergency and should be followed up as soon as possible.  Feel free to call the clinic should you have any questions or concerns. The clinic phone number is [phone].  Please show the [facility] at check-in to the Emergency Department and triage nurse.  Pembrolizumab injection What is this medicine? PEMBROLIZUMAB (pem broe liz ue mab) is a monoclonal antibody. It is used to treat certain types of cancer. This medicine may be used for other purposes; ask your health care provider or pharmacist if you have questions. COMMON BRAND NAME(S): Keytruda What should I tell my health care provider before I take this medicine? They need to know if you have any of these conditions:  diabetes  immune system problems  inflammatory bowel disease  liver disease  lung or breathing disease  lupus  received or scheduled to receive an organ transplant or a stem-cell transplant that uses donor stem cells  an unusual or allergic reaction to pembrolizumab, other medicines, foods, dyes, or preservatives  pregnant or trying to get pregnant  breast-feeding How should I use this  medicine? This medicine is for infusion into a vein. It is given by a health care professional in a hospital or clinic setting. A special MedGuide will be given to you before each treatment. Be sure to read this information carefully each time. Talk to your pediatrician regarding the use of this medicine in children. While this drug may be prescribed for children as young as 6 months for selected conditions, precautions do apply. Overdosage: If you think you have taken too much of this medicine contact a poison control center or emergency room at once. NOTE: This medicine is only for you. Do not share this medicine with others. What if I miss a dose? It is important not to miss your dose. Call your doctor or health care professional if you are unable to keep an appointment. What may interact with this medicine? Interactions have not been studied. Give your health care provider a list of all the medicines, herbs, non-prescription drugs, or dietary supplements you use. Also tell them if you smoke, drink alcohol, or use illegal drugs. Some items may interact with your medicine. This list may not describe all possible interactions. Give your health care provider a list of all the medicines, herbs, non-prescription drugs, or dietary supplements you use. Also tell them if you smoke, drink alcohol, or use illegal drugs. Some items may interact with your medicine. What should I watch for while using this medicine? Your condition will be monitored carefully while you are receiving this medicine. You may need blood work done while you are taking this medicine. Do not become pregnant while taking this medicine or for 4 months after stopping it.  Women should inform their doctor if they wish to become pregnant or think they might be pregnant. There is a potential for serious side effects to an unborn child. Talk to your health care professional or pharmacist for more information. Do not breast-feed an infant while  taking this medicine or for 4 months after the last dose. What side effects may I notice from receiving this medicine? Side effects that you should report to your doctor or health care professional as soon as possible:  allergic reactions like skin rash, itching or hives, swelling of the face, lips, or tongue  bloody or black, tarry  breathing problems  changes in vision  chest pain  chills  confusion  constipation  cough  diarrhea  dizziness or feeling faint or lightheaded  fast or irregular heartbeat  fever  flushing  joint pain  low blood counts - this medicine may decrease the number of white blood cells, red blood cells and platelets. You may be at increased risk for infections and bleeding.  muscle pain  muscle weakness  pain, tingling, numbness in the hands or feet  persistent headache  redness, blistering, peeling or loosening of the skin, including inside the mouth  signs and symptoms of high blood sugar such as dizziness; dry mouth; dry skin; fruity breath; nausea; stomach pain; increased hunger or thirst; increased urination  signs and symptoms of kidney injury like trouble passing urine or change in the amount of urine  signs and symptoms of liver injury like dark urine, light-colored stools, loss of appetite, nausea, right upper belly pain, yellowing of the eyes or skin  sweating  swollen lymph nodes  weight loss Side effects that usually do not require medical attention (report to your doctor or health care professional if they continue or are bothersome):  decreased appetite  hair loss  muscle pain  tiredness This list may not describe all possible side effects. Call your doctor for medical advice about side effects. You may report side effects to FDA at 1-800-FDA-1088. Where should I keep my medicine? This drug is given in a hospital or clinic and will not be stored at home. NOTE: This sheet is a summary. It may not cover all  possible information. If you have questions about this medicine, talk to your doctor, pharmacist, or health care provider.  2020 Elsevier/Gold Standard (2019-03-19 18:07:58)  Pemetrexed injection What is this medicine? PEMETREXED (PEM e TREX ed) is a chemotherapy drug used to treat lung cancers like non-small cell lung cancer and mesothelioma. It may also be used to treat other cancers. This medicine may be used for other purposes; ask your health care provider or pharmacist if you have questions. COMMON BRAND NAME(S): Alimta What should I tell my health care provider before I take this medicine? They need to know if you have any of these conditions:  infection (especially a virus infection such as chickenpox, cold sores, or herpes)  kidney disease  low blood counts, like low white cell, platelet, or red cell counts  lung or breathing disease, like asthma  radiation therapy  an unusual or allergic reaction to pemetrexed, other medicines, foods, dyes, or preservative  pregnant or trying to get pregnant  breast-feeding How should I use this medicine? This drug is given as an infusion into a vein. It is administered in a hospital or clinic by a specially trained health care professional. Talk to your pediatrician regarding the use of this medicine in children. Special care may be needed. Overdosage: If  you think you have taken too much of this medicine contact a poison control center or emergency room at once. NOTE: This medicine is only for you. Do not share this medicine with others. What if I miss a dose? It is important not to miss your dose. Call your doctor or health care professional if you are unable to keep an appointment. What may interact with this medicine? This medicine may interact with the following medications:  Ibuprofen This list may not describe all possible interactions. Give your health care provider a list of all the medicines, herbs, non-prescription drugs,  or dietary supplements you use. Also tell them if you smoke, drink alcohol, or use illegal drugs. Some items may interact with your medicine. What should I watch for while using this medicine? Visit your doctor for checks on your progress. This drug may make you feel generally unwell. This is not uncommon, as chemotherapy can affect healthy cells as well as cancer cells. Report any side effects. Continue your course of treatment even though you feel ill unless your doctor tells you to stop. In some cases, you may be given additional medicines to help with side effects. Follow all directions for their use. Call your doctor or health care professional for advice if you get a fever, chills or sore throat, or other symptoms of a cold or flu. Do not treat yourself. This drug decreases your body's ability to fight infections. Try to avoid being around people who are sick. This medicine may increase your risk to bruise or bleed. Call your doctor or health care professional if you notice any unusual bleeding. Be careful brushing and flossing your teeth or using a toothpick because you may get an infection or bleed more easily. If you have any dental work done, tell your dentist you are receiving this medicine. Avoid taking products that contain aspirin, acetaminophen, ibuprofen, naproxen, or ketoprofen unless instructed by your doctor. These medicines may hide a fever. Call your doctor or health care professional if you get diarrhea or mouth sores. Do not treat yourself. To protect your kidneys, drink water or other fluids as directed while you are taking this medicine. Do not become pregnant while taking this medicine or for 6 months after stopping it. Women should inform their doctor if they wish to become pregnant or think they might be pregnant. Men should not father a child while taking this medicine and for 3 months after stopping it. This may interfere with the ability to father a child. You should talk to  your doctor or health care professional if you are concerned about your fertility. There is a potential for serious side effects to an unborn child. Talk to your health care professional or pharmacist for more information. Do not breast-feed an infant while taking this medicine or for 1 week after stopping it. What side effects may I notice from receiving this medicine? Side effects that you should report to your doctor or health care professional as soon as possible:  allergic reactions like skin rash, itching or hives, swelling of the face, lips, or tongue  breathing problems  redness, blistering, peeling or loosening of the skin, including inside the mouth  signs and symptoms of bleeding such as bloody or black, tarry stools; red or dark-brown urine; spitting up blood or brown material that looks like coffee grounds; red spots on the skin; unusual bruising or bleeding from the eye, gums, or nose  signs and symptoms of infection like fever or chills; cough;  sore throat; pain or trouble passing urine  signs and symptoms of kidney injury like trouble passing urine or change in the amount of urine  signs and symptoms of liver injury like dark yellow or brown urine; general ill feeling or flu-like symptoms; light-colored stools; loss of appetite; nausea; right upper belly pain; unusually weak or tired; yellowing of the eyes or skin Side effects that usually do not require medical attention (report to your doctor or health care professional if they continue or are bothersome):  constipation  mouth sores  nausea, vomiting  unusually weak or tired This list may not describe all possible side effects. Call your doctor for medical advice about side effects. You may report side effects to FDA at 1-800-FDA-1088. Where should I keep my medicine? This drug is given in a hospital or clinic and will not be stored at home. NOTE: This sheet is a summary. It may not cover all possible information. If  you have questions about this medicine, talk to your doctor, pharmacist, or health care provider.  2020 Elsevier/Gold Standard (2017-07-02 16:11:33)  Carboplatin injection What is this medicine? CARBOPLATIN (KAR boe pla tin) is a chemotherapy drug. It targets fast dividing cells, like cancer cells, and causes these cells to die. This medicine is used to treat ovarian cancer and many other cancers. This medicine may be used for other purposes; ask your health care provider or pharmacist if you have questions. COMMON BRAND NAME(S): Paraplatin What should I tell my health care provider before I take this medicine? They need to know if you have any of these conditions:  blood disorders  hearing problems  kidney disease  recent or ongoing radiation therapy  an unusual or allergic reaction to carboplatin, cisplatin, other chemotherapy, other medicines, foods, dyes, or preservatives  pregnant or trying to get pregnant  breast-feeding How should I use this medicine? This drug is usually given as an infusion into a vein. It is administered in a hospital or clinic by a specially trained health care professional. Talk to your pediatrician regarding the use of this medicine in children. Special care may be needed. Overdosage: If you think you have taken too much of this medicine contact a poison control center or emergency room at once. NOTE: This medicine is only for you. Do not share this medicine with others. What if I miss a dose? It is important not to miss a dose. Call your doctor or health care professional if you are unable to keep an appointment. What may interact with this medicine?  medicines for seizures  medicines to increase blood counts like filgrastim, pegfilgrastim, sargramostim  some antibiotics like amikacin, gentamicin, neomycin, streptomycin, tobramycin  vaccines Talk to your doctor or health care professional before taking any of these  medicines:  acetaminophen  aspirin  ibuprofen  ketoprofen  naproxen This list may not describe all possible interactions. Give your health care provider a list of all the medicines, herbs, non-prescription drugs, or dietary supplements you use. Also tell them if you smoke, drink alcohol, or use illegal drugs. Some items may interact with your medicine. What should I watch for while using this medicine? Your condition will be monitored carefully while you are receiving this medicine. You will need important blood work done while you are taking this medicine. This drug may make you feel generally unwell. This is not uncommon, as chemotherapy can affect healthy cells as well as cancer cells. Report any side effects. Continue your course of treatment even though you  feel ill unless your doctor tells you to stop. In some cases, you may be given additional medicines to help with side effects. Follow all directions for their use. Call your doctor or health care professional for advice if you get a fever, chills or sore throat, or other symptoms of a cold or flu. Do not treat yourself. This drug decreases your body's ability to fight infections. Try to avoid being around people who are sick. This medicine may increase your risk to bruise or bleed. Call your doctor or health care professional if you notice any unusual bleeding. Be careful brushing and flossing your teeth or using a toothpick because you may get an infection or bleed more easily. If you have any dental work done, tell your dentist you are receiving this medicine. Avoid taking products that contain aspirin, acetaminophen, ibuprofen, naproxen, or ketoprofen unless instructed by your doctor. These medicines may hide a fever. Do not become pregnant while taking this medicine. Women should inform their doctor if they wish to become pregnant or think they might be pregnant. There is a potential for serious side effects to an unborn child. Talk  to your health care professional or pharmacist for more information. Do not breast-feed an infant while taking this medicine. What side effects may I notice from receiving this medicine? Side effects that you should report to your doctor or health care professional as soon as possible:  allergic reactions like skin rash, itching or hives, swelling of the face, lips, or tongue  signs of infection - fever or chills, cough, sore throat, pain or difficulty passing urine  signs of decreased platelets or bleeding - bruising, pinpoint red spots on the skin, black, tarry stools, nosebleeds  signs of decreased red blood cells - unusually weak or tired, fainting spells, lightheadedness  breathing problems  changes in hearing  changes in vision  chest pain  high blood pressure  low blood counts - This drug may decrease the number of white blood cells, red blood cells and platelets. You may be at increased risk for infections and bleeding.  nausea and vomiting  pain, swelling, redness or irritation at the injection site  pain, tingling, numbness in the hands or feet  problems with balance, talking, walking  trouble passing urine or change in the amount of urine Side effects that usually do not require medical attention (report to your doctor or health care professional if they continue or are bothersome):  hair loss  loss of appetite  metallic taste in the mouth or changes in taste This list may not describe all possible side effects. Call your doctor for medical advice about side effects. You may report side effects to FDA at 1-800-FDA-1088. Where should I keep my medicine? This drug is given in a hospital or clinic and will not be stored at home. NOTE: This sheet is a summary. It may not cover all possible information. If you have questions about this medicine, talk to your doctor, pharmacist, or health care provider.  2020 Elsevier/Gold Standard (2007-08-18 14:38:05)

## 2019-08-05 ENCOUNTER — Encounter: Payer: Self-pay | Admitting: *Deleted

## 2019-08-05 ENCOUNTER — Telehealth: Payer: Self-pay | Admitting: Internal Medicine

## 2019-08-05 NOTE — Telephone Encounter (Signed)
Scheduled per 3/10 los. Called and left msg. Mailed printout

## 2019-08-06 ENCOUNTER — Other Ambulatory Visit: Payer: Self-pay | Admitting: Family Medicine

## 2019-08-06 ENCOUNTER — Telehealth: Payer: Self-pay | Admitting: Family Medicine

## 2019-08-06 NOTE — Telephone Encounter (Signed)
Pt called and reminded of appointment

## 2019-08-09 ENCOUNTER — Encounter: Payer: Self-pay | Admitting: Family Medicine

## 2019-08-09 ENCOUNTER — Ambulatory Visit (INDEPENDENT_AMBULATORY_CARE_PROVIDER_SITE_OTHER): Payer: Medicare Other | Admitting: Family Medicine

## 2019-08-09 ENCOUNTER — Other Ambulatory Visit: Payer: Self-pay

## 2019-08-09 DIAGNOSIS — I10 Essential (primary) hypertension: Secondary | ICD-10-CM | POA: Diagnosis not present

## 2019-08-09 DIAGNOSIS — K5900 Constipation, unspecified: Secondary | ICD-10-CM

## 2019-08-09 DIAGNOSIS — E119 Type 2 diabetes mellitus without complications: Secondary | ICD-10-CM

## 2019-08-09 DIAGNOSIS — Z09 Encounter for follow-up examination after completed treatment for conditions other than malignant neoplasm: Secondary | ICD-10-CM

## 2019-08-09 DIAGNOSIS — R7303 Prediabetes: Secondary | ICD-10-CM | POA: Diagnosis not present

## 2019-08-09 DIAGNOSIS — E1151 Type 2 diabetes mellitus with diabetic peripheral angiopathy without gangrene: Secondary | ICD-10-CM

## 2019-08-09 DIAGNOSIS — C3490 Malignant neoplasm of unspecified part of unspecified bronchus or lung: Secondary | ICD-10-CM

## 2019-08-09 MED ORDER — LACTULOSE 10 GM/15ML PO SOLN: 20.0000 g | Freq: Two times a day (BID) | ORAL | 3 refills | Status: DC | PRN

## 2019-08-09 NOTE — Progress Notes (Signed)
Virtual Visit via Telephone Note  I connected with Kirk [name] on 08/11/19 at  2:00 PM EDT by telephone and verified that I am speaking with the correct person using two identifiers.   I discussed the limitations, risks, security and privacy concerns of performing an evaluation and management service by telephone and the availability of in person appointments. I also discussed with the patient that there may be a patient responsible charge related to this service. The patient expressed understanding and agreed to proceed.   History of Present Illness:  Past Surgical History:  Procedure Laterality Date  . CARDIAC CATHETERIZATION N/A 09/05/2015   Procedure: Left Heart Cath and Coronary Angiography;  Surgeon: Troy Sine, MD;  Location: [facility];  Service: Cardiovascular;  Laterality: N/A;  . CARDIAC CATHETERIZATION N/A 09/05/2015   Procedure: Coronary Stent Intervention;  Surgeon: Troy Sine, MD;  Location: [facility];  Service: Cardiovascular;  Laterality: N/A;  . IR IMAGING GUIDED PORT INSERTION  07/15/2019  . 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 PERIPHERAL VASCULAR CATHETERIZATION Bilateral 2003   "stenting"  . TONSILLECTOMY  ~ 1963    Social History   Socioeconomic History  . Marital status: Married    Spouse name: Not on file  . Number of children: 1  . Years of education: Not on file  . Highest education level: Not on file  Occupational History  . Occupation: Acupuncturist  Tobacco Use  . Smoking status: Former Smoker    Packs/day: 2.00    Years: 35.00    Pack years: 70.00    Types: Cigarettes    Quit date: 02/19/2003    Years since quitting: 16.4  . Smokeless tobacco: Never Used  Substance and Sexual Activity  . Alcohol use: Not Currently    Alcohol/week: 0.0 standard drinks    Comment: 09/04/2015 "nothing since 1980s"  . 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 Sexual  activity: Not Currently  Other Topics Concern  . Not on file  Social History Narrative  . Not on file   Social Determinants of Health   Financial Resource Strain:   . Difficulty of Paying Living Expenses:   Food Insecurity:   . Worried About Charity fundraiser in the Last Year:   . Arboriculturist in the Last Year:   Transportation Needs:   . Film/video editor (Medical):   [name] Lack of Transportation (Non-Medical):   Physical Activity:   . Days of Exercise per Week:   . Minutes of Exercise per Session:   Stress:   . Feeling of Stress :   Social Connections:   . Frequency of Communication with Friends and Family:   . Frequency of Social Gatherings with Friends and Family:   . Attends Religious Services:   . Active Member of Clubs or Organizations:   . Attends Archivist Meetings:   [name] Marital Status:   Intimate Partner Violence:   . Fear of Current or Ex-Partner:   . Emotionally Abused:   [name] Physically Abused:   . Sexually Abused:      Family History  Problem Relation Age of Onset  . Lymphoma Mother   . Cancer - Prostate Father   . CAD Neg Hx   . Colon cancer Neg Hx   . Rectal cancer Neg Hx   . Stomach cancer  Neg Hx   . Esophageal cancer Neg Hx     No Known Allergies   Past Medical History:  Diagnosis Date  . Arthritis    "right knee" (09/04/2015)  . Atrial flutter with rapid ventricular response ([facility]) 04/19/2019  . Dyspnea    increased exertion  . HTN (hypertension)   . Hyperlipidemia   . Lung cancer ([facility]) 05/2019  . NSTEMI (non-ST elevated myocardial infarction) ([facility]) 09/04/2015   a. cath 09/05/2015: 95% stenosis mid-LAD, 55% mid-RCA, and 40% prox-RCA. PCI performed w/ Synergy DES to LAD  . PAD (peripheral artery disease) ([facility])    Stenting of bilateral iliacs in 2003.  [name] Refusal of blood transfusions as patient is Jehovah's Witness   . Type II diabetes mellitus ([place])     Current Status: Since his last office visit, he was recently  diagnosed with Metastatic Non-Small Lung Cancer on 07/2019 is doing well with no complaints. He denies fatigue, frequent urination, blurred vision, excessive hunger, excessive thirst, weight gain, weight loss, and poor wound healing. He continues to check his feet regularly. He denies visual changes, chest pain, cough, shortness of breath, heart palpitations, and falls. He has occasional headaches and dizziness with position changes. Denies severe headaches, confusion, seizures, double vision, and blurred vision, nausea and vomiting. He denies fevers, chills, recent infections, weight loss, and night sweats. No reports of GI problems such as nausea, vomiting, diarrhea, and constipation. He has no reports of blood in stools, dysuria and hematuria. No depression or anxiety reported today. He denies suicidal ideations, homicidal ideations, or auditory hallucinations. He denies pain today.   Observations/Objective:  Telephone Virtual Visit   Assessment and Plan:  1. Lung cancer He is currently undergoing Chemotherapy treatments.   2. Constipation, unspecified constipation type - lactulose (CHRONULAC) 10 GM/15ML solution; Take 30 mLs (20 g total) by mouth 2 (two) times daily as needed for moderate constipation.  Dispense: 240 mL; Refill: 3  3. Diabetes mellitus with peripheral vascular disease ([facility]) He will continue medication as prescribed, to decrease foods/beverages high in sugars and carbs and follow Heart Healthy or DASH diet. Increase physical activity to at least 30 minutes cardio exercise daily.   4. Hemoglobin A1C between 7% and 9% indicating borderline diabetic control  5. Essential hypertension She will continue to take medications as prescribed, to decrease high sodium intake, excessive alcohol intake, increase potassium intake, smoking cessation, and increase physical activity of at least 30 minutes of cardio activity daily. She will continue to follow Heart Healthy or DASH diet.  6.  Follow up He will follow up in 3 months.   I discussed the assessment and treatment plan with the patient. The patient was provided an opportunity to ask questions and all were answered. The patient agreed with the plan and demonstrated an understanding of the instructions.   The patient was advised to call back or seek an in-person evaluation if the symptoms worsen or if the condition fails to improve as anticipated.  I provided 20 minutes of non-face-to-face time during this encounter.   [name], FNP

## 2019-08-11 ENCOUNTER — Inpatient Hospital Stay: Payer: Medicare Other

## 2019-08-11 ENCOUNTER — Other Ambulatory Visit: Payer: Self-pay

## 2019-08-11 ENCOUNTER — Telehealth: Payer: Self-pay | Admitting: Family Medicine

## 2019-08-11 DIAGNOSIS — R5382 Chronic fatigue, unspecified: Secondary | ICD-10-CM

## 2019-08-11 DIAGNOSIS — Z5111 Encounter for antineoplastic chemotherapy: Secondary | ICD-10-CM | POA: Diagnosis not present

## 2019-08-11 DIAGNOSIS — C3490 Malignant neoplasm of unspecified part of unspecified bronchus or lung: Secondary | ICD-10-CM

## 2019-08-11 LAB — CMP (CANCER CENTER ONLY)
ALT: 20 U/L (ref 0–44)
AST: 20 U/L (ref 15–41)
Albumin: 4 g/dL (ref 3.5–5.0)
Alkaline Phosphatase: 62 U/L (ref 38–126)
Anion gap: 12 (ref 5–15)
BUN: 15 mg/dL (ref 8–23)
CO2: 24 mmol/L (ref 22–32)
Calcium: 9.1 mg/dL (ref 8.9–10.3)
Chloride: 102 mmol/L (ref 98–111)
Creatinine: 0.84 mg/dL (ref 0.61–1.24)
GFR, Est AFR Am: >60 mL/min (ref >60)
GFR, Estimated: >60 mL/min (ref >60)
Glucose, Bld: 148 mg/dL — ABNORMAL HIGH (ref 70–99)
Potassium: 4.1 mmol/L (ref 3.5–5.1)
Sodium: 138 mmol/L (ref 135–145)
Total Bilirubin: 0.4 mg/dL (ref 0.3–1.2)
Total Protein: 7.8 g/dL (ref 6.5–8.1)

## 2019-08-11 LAB — CBC WITH DIFFERENTIAL (CANCER CENTER ONLY)
Abs Immature Granulocytes: 0 10*3/uL (ref 0.00–0.07)
Basophils Absolute: 0 10*3/uL (ref 0.0–0.1)
Basophils Relative: 1 %
Eosinophils Absolute: 0 10*3/uL (ref 0.0–0.5)
Eosinophils Relative: 0 %
HCT: 32.5 % — ABNORMAL LOW (ref 39.0–52.0)
Hemoglobin: 10.5 g/dL — ABNORMAL LOW (ref 13.0–17.0)
Immature Granulocytes: 0 %
Lymphocytes Relative: 78 %
Lymphs Abs: 2.3 10*3/uL (ref 0.7–4.0)
MCH: 27.3 pg (ref 26.0–34.0)
MCHC: 32.3 g/dL (ref 30.0–36.0)
MCV: 84.4 fL (ref 80.0–100.0)
Monocytes Absolute: 0.1 10*3/uL (ref 0.1–1.0)
Monocytes Relative: 3 %
Neutro Abs: 0.5 10*3/uL — ABNORMAL LOW (ref 1.7–7.7)
Neutrophils Relative %: 18 %
Platelet Count: 378 10*3/uL (ref 150–400)
RBC: 3.85 MIL/uL — ABNORMAL LOW (ref 4.22–5.81)
RDW: 13.8 % (ref 11.5–15.5)
WBC Count: 3 10*3/uL — ABNORMAL LOW (ref 4.0–10.5)
nRBC: 0 % (ref 0.0–0.2)

## 2019-08-11 LAB — TSH: TSH: 1.225 u[IU]/mL (ref 0.320–4.118)

## 2019-08-11 NOTE — Telephone Encounter (Signed)
Pt was called and a message was left informing him to call the office to set up a appointment for the month of June

## 2019-08-18 ENCOUNTER — Inpatient Hospital Stay: Payer: Medicare Other

## 2019-08-18 ENCOUNTER — Other Ambulatory Visit: Payer: Self-pay

## 2019-08-18 DIAGNOSIS — Z5111 Encounter for antineoplastic chemotherapy: Secondary | ICD-10-CM | POA: Diagnosis not present

## 2019-08-18 DIAGNOSIS — C3490 Malignant neoplasm of unspecified part of unspecified bronchus or lung: Secondary | ICD-10-CM

## 2019-08-18 LAB — CMP (CANCER CENTER ONLY)
ALT: 19 U/L (ref 0–44)
AST: 18 U/L (ref 15–41)
Albumin: 3.8 g/dL (ref 3.5–5.0)
Alkaline Phosphatase: 66 U/L (ref 38–126)
Anion gap: 11 (ref 5–15)
BUN: 8 mg/dL (ref 8–23)
CO2: 24 mmol/L (ref 22–32)
Calcium: 9.2 mg/dL (ref 8.9–10.3)
Chloride: 106 mmol/L (ref 98–111)
Creatinine: 0.85 mg/dL (ref 0.61–1.24)
GFR, Est AFR Am: >60 mL/min (ref >60)
GFR, Estimated: >60 mL/min (ref >60)
Glucose, Bld: 142 mg/dL — ABNORMAL HIGH (ref 70–99)
Potassium: 4 mmol/L (ref 3.5–5.1)
Sodium: 141 mmol/L (ref 135–145)
Total Bilirubin: <0.2 mg/dL — ABNORMAL LOW (ref 0.3–1.2)
Total Protein: 7.3 g/dL (ref 6.5–8.1)

## 2019-08-18 LAB — CBC WITH DIFFERENTIAL (CANCER CENTER ONLY)
Abs Immature Granulocytes: 0 10*3/uL (ref 0.00–0.07)
Basophils Absolute: 0 10*3/uL (ref 0.0–0.1)
Basophils Relative: 1 %
Eosinophils Absolute: 0 10*3/uL (ref 0.0–0.5)
Eosinophils Relative: 1 %
HCT: 31 % — ABNORMAL LOW (ref 39.0–52.0)
Hemoglobin: 9.9 g/dL — ABNORMAL LOW (ref 13.0–17.0)
Immature Granulocytes: 0 %
Lymphocytes Relative: 60 %
Lymphs Abs: 2 10*3/uL (ref 0.7–4.0)
MCH: 27.1 pg (ref 26.0–34.0)
MCHC: 31.9 g/dL (ref 30.0–36.0)
MCV: 84.9 fL (ref 80.0–100.0)
Monocytes Absolute: 0.8 10*3/uL (ref 0.1–1.0)
Monocytes Relative: 23 %
Neutro Abs: 0.5 10*3/uL — ABNORMAL LOW (ref 1.7–7.7)
Neutrophils Relative %: 15 %
Platelet Count: 134 10*3/uL — ABNORMAL LOW (ref 150–400)
RBC: 3.65 MIL/uL — ABNORMAL LOW (ref 4.22–5.81)
RDW: 13.8 % (ref 11.5–15.5)
WBC Count: 3.4 10*3/uL — ABNORMAL LOW (ref 4.0–10.5)
nRBC: 0 % (ref 0.0–0.2)

## 2019-08-19 NOTE — Progress Notes (Signed)
Pharmacist Chemotherapy Monitoring - Follow Up Assessment    I verify that I have reviewed each item in the below checklist:  . Regimen for the patient is scheduled for the appropriate day and plan matches scheduled date. [name] Appropriate non-routine labs are ordered dependent on drug ordered. . If applicable, additional medications reviewed and ordered per protocol based on lifetime cumulative doses and/or treatment regimen.   Plan for follow-up and/or issues identified: No . I-vent associated with next due treatment: No    Kennith Center, Pharm.D., CPP 08/19/2019@4 :24 PM

## 2019-08-25 ENCOUNTER — Other Ambulatory Visit: Payer: Self-pay

## 2019-08-25 ENCOUNTER — Inpatient Hospital Stay: Payer: Medicare Other

## 2019-08-25 ENCOUNTER — Encounter: Payer: Self-pay | Admitting: Internal Medicine

## 2019-08-25 ENCOUNTER — Inpatient Hospital Stay (HOSPITAL_BASED_OUTPATIENT_CLINIC_OR_DEPARTMENT_OTHER): Payer: Medicare Other | Admitting: Internal Medicine

## 2019-08-25 DIAGNOSIS — I2510 Atherosclerotic heart disease of native coronary artery without angina pectoris: Secondary | ICD-10-CM

## 2019-08-25 DIAGNOSIS — Z95828 Presence of other vascular implants and grafts: Secondary | ICD-10-CM

## 2019-08-25 DIAGNOSIS — C3490 Malignant neoplasm of unspecified part of unspecified bronchus or lung: Secondary | ICD-10-CM

## 2019-08-25 DIAGNOSIS — R5382 Chronic fatigue, unspecified: Secondary | ICD-10-CM

## 2019-08-25 DIAGNOSIS — Z5111 Encounter for antineoplastic chemotherapy: Secondary | ICD-10-CM | POA: Diagnosis not present

## 2019-08-25 LAB — CMP (CANCER CENTER ONLY)
ALT: 15 U/L (ref 0–44)
AST: 18 U/L (ref 15–41)
Albumin: 3.8 g/dL (ref 3.5–5.0)
Alkaline Phosphatase: 58 U/L (ref 38–126)
Anion gap: 13 (ref 5–15)
BUN: 9 mg/dL (ref 8–23)
CO2: 22 mmol/L (ref 22–32)
Calcium: 9.1 mg/dL (ref 8.9–10.3)
Chloride: 105 mmol/L (ref 98–111)
Creatinine: 1.03 mg/dL (ref 0.61–1.24)
GFR, Est AFR Am: >60 mL/min (ref >60)
GFR, Estimated: >60 mL/min (ref >60)
Glucose, Bld: 234 mg/dL — ABNORMAL HIGH (ref 70–99)
Potassium: 3.9 mmol/L (ref 3.5–5.1)
Sodium: 140 mmol/L (ref 135–145)
Total Bilirubin: 0.2 mg/dL — ABNORMAL LOW (ref 0.3–1.2)
Total Protein: 7.3 g/dL (ref 6.5–8.1)

## 2019-08-25 LAB — CBC WITH DIFFERENTIAL (CANCER CENTER ONLY)
Abs Immature Granulocytes: 0.01 10*3/uL (ref 0.00–0.07)
Basophils Absolute: 0 10*3/uL (ref 0.0–0.1)
Basophils Relative: 1 %
Eosinophils Absolute: 0.1 10*3/uL (ref 0.0–0.5)
Eosinophils Relative: 2 %
HCT: 31.9 % — ABNORMAL LOW (ref 39.0–52.0)
Hemoglobin: 10.1 g/dL — ABNORMAL LOW (ref 13.0–17.0)
Immature Granulocytes: 0 %
Lymphocytes Relative: 48 %
Lymphs Abs: 2.2 10*3/uL (ref 0.7–4.0)
MCH: 27.8 pg (ref 26.0–34.0)
MCHC: 31.7 g/dL (ref 30.0–36.0)
MCV: 87.9 fL (ref 80.0–100.0)
Monocytes Absolute: 0.6 10*3/uL (ref 0.1–1.0)
Monocytes Relative: 14 %
Neutro Abs: 1.7 10*3/uL (ref 1.7–7.7)
Neutrophils Relative %: 35 %
Platelet Count: 277 10*3/uL (ref 150–400)
RBC: 3.63 MIL/uL — ABNORMAL LOW (ref 4.22–5.81)
RDW: 16.2 % — ABNORMAL HIGH (ref 11.5–15.5)
WBC Count: 4.7 10*3/uL (ref 4.0–10.5)
nRBC: 0 % (ref 0.0–0.2)

## 2019-08-25 LAB — TSH: TSH: 0.276 u[IU]/mL — ABNORMAL LOW (ref 0.320–4.118)

## 2019-08-25 MED ORDER — SODIUM CHLORIDE 0.9 % IV SOLN
200.0000 mg | Freq: Once | INTRAVENOUS | Status: AC
  Administered 2019-08-25: 200 mg via INTRAVENOUS
  Filled 2019-08-25: qty 8

## 2019-08-25 MED ORDER — PALONOSETRON HCL INJECTION 0.25 MG/5ML
0.2500 mg | Freq: Once | INTRAVENOUS | Status: AC
  Administered 2019-08-25: 0.25 mg via INTRAVENOUS

## 2019-08-25 MED ORDER — PALONOSETRON HCL INJECTION 0.25 MG/5ML
INTRAVENOUS | Status: AC
  Filled 2019-08-25: qty 5

## 2019-08-25 MED ORDER — SODIUM CHLORIDE 0.9 % IV SOLN
604.5000 mg | Freq: Once | INTRAVENOUS | Status: AC
  Administered 2019-08-25: 600 mg via INTRAVENOUS
  Filled 2019-08-25: qty 60

## 2019-08-25 MED ORDER — SODIUM CHLORIDE 0.9% FLUSH
10.0000 mL | INTRAVENOUS | Status: DC | PRN
  Administered 2019-08-25: 10 mL
  Filled 2019-08-25: qty 10

## 2019-08-25 MED ORDER — CYANOCOBALAMIN 1000 MCG/ML IJ SOLN
1000.0000 ug | Freq: Once | INTRAMUSCULAR | Status: AC
  Administered 2019-08-25: 1000 ug via INTRAMUSCULAR

## 2019-08-25 MED ORDER — SODIUM CHLORIDE 0.9% FLUSH
10.0000 mL | INTRAVENOUS | Status: DC | PRN
  Administered 2019-08-25: 10 mL via INTRAVENOUS
  Filled 2019-08-25: qty 10

## 2019-08-25 MED ORDER — DEXAMETHASONE SODIUM PHOSPHATE 10 MG/ML IJ SOLN
INTRAMUSCULAR | Status: AC
  Filled 2019-08-25: qty 1

## 2019-08-25 MED ORDER — CYANOCOBALAMIN 1000 MCG/ML IJ SOLN
INTRAMUSCULAR | Status: AC
  Filled 2019-08-25: qty 1

## 2019-08-25 MED ORDER — DEXAMETHASONE SODIUM PHOSPHATE 10 MG/ML IJ SOLN
10.0000 mg | Freq: Once | INTRAMUSCULAR | Status: AC
  Administered 2019-08-25: 10 mg via INTRAVENOUS

## 2019-08-25 MED ORDER — HEPARIN SOD (PORK) LOCK FLUSH 100 UNIT/ML IV SOLN
500.0000 [IU] | Freq: Once | INTRAVENOUS | Status: AC | PRN
  Administered 2019-08-25: 500 [IU]
  Filled 2019-08-25: qty 5

## 2019-08-25 MED ORDER — SODIUM CHLORIDE 0.9 % IV SOLN
Freq: Once | INTRAVENOUS | Status: AC
  Filled 2019-08-25: qty 250

## 2019-08-25 MED ORDER — SODIUM CHLORIDE 0.9 % IV SOLN
500.0000 mg/m2 | Freq: Once | INTRAVENOUS | Status: AC
  Administered 2019-08-25: 1100 mg via INTRAVENOUS
  Filled 2019-08-25: qty 40

## 2019-08-25 MED ORDER — SODIUM CHLORIDE 0.9 % IV SOLN
150.0000 mg | Freq: Once | INTRAVENOUS | Status: AC
  Administered 2019-08-25: 150 mg via INTRAVENOUS
  Filled 2019-08-25: qty 150

## 2019-08-25 NOTE — Patient Instructions (Signed)

## 2019-08-25 NOTE — Patient Instructions (Signed)
[facility] Discharge Instructions for Patients Receiving Chemotherapy  Today you received the following chemotherapy agents: pembrolizumab, pemetrexed, carboplatin.  To help prevent nausea and vomiting after your treatment, we encourage you to take your nausea medication as directed.   If you develop nausea and vomiting that is not controlled by your nausea medication, call the clinic.   BELOW ARE SYMPTOMS THAT SHOULD BE REPORTED IMMEDIATELY:  *FEVER GREATER THAN 100.5 F  *CHILLS WITH OR WITHOUT FEVER  NAUSEA AND VOMITING THAT IS NOT CONTROLLED WITH YOUR NAUSEA MEDICATION  *UNUSUAL SHORTNESS OF BREATH  *UNUSUAL BRUISING OR BLEEDING  TENDERNESS IN MOUTH AND THROAT WITH OR WITHOUT PRESENCE OF ULCERS  *URINARY PROBLEMS  *BOWEL PROBLEMS  UNUSUAL RASH Items with * indicate a potential emergency and should be followed up as soon as possible.  Feel free to call the clinic should you have any questions or concerns. The clinic phone number is [phone].  Please show the [place] at check-in to the Emergency Department and triage nurse.

## 2019-08-25 NOTE — Progress Notes (Signed)
[facility] [phone]   [phone]  OFFICE PROGRESS NOTE  [name], FNP [name] [address]  DIAGNOSIS: Stage IV (TX, N3, M1c) non-small cell lung cancer, poorly differentiated adenocarcinoma presented with bulky mediastinal lymphadenopathy in addition to right axillary lymphadenopathy and metastatic liver lesions, abdominal lymphadenopathy diagnosed in January 2021. Molecular studies by [employer] that shows no actionable mutations.  PRIOR THERAPY: None  CURRENT THERAPY: Systemic chemotherapy with carboplatin for AUC of 5, Alimta 500 mg/M2 and Keytruda 200 mg IV every 3 weeks.  First dose July 14, 2019.  Status post 2 cycles.  INTERVAL HISTORY: Kirk [name] 64 y.o. male returns to the clinic today for follow-up visit.  The patient is feeling fine today with no concerning complaints except for mild fatigue.  He tolerated the second cycle of his systemic chemotherapy fairly well.  He denied having any nausea, vomiting, diarrhea or constipation.  He denied having any weight loss or night sweats.  He has no fever or chills.  He denied having any significant chest pain, shortness of breath, cough or hemoptysis.  He is here today for evaluation before starting cycle #3.  MEDICAL HISTORY: Past Medical History:  Diagnosis Date  . Arthritis    "right knee" (09/04/2015)  . Atrial flutter with rapid ventricular response ([facility]) 04/19/2019  . Dyspnea    increased exertion  . HTN (hypertension)   . Hyperlipidemia   . Lung cancer ([facility]) 05/2019  . NSTEMI (non-ST elevated myocardial infarction) ([facility]) 09/04/2015   a. cath 09/05/2015: 95% stenosis mid-LAD, 55% mid-RCA, and 40% prox-RCA. PCI performed w/ Synergy DES to LAD  . PAD (peripheral artery disease) ([facility])    Stenting of bilateral iliacs in 2003.  [name] Refusal of blood transfusions as patient is Jehovah's Witness   . Type II diabetes mellitus ([facility])     ALLERGIES:  has  No Known Allergies.  MEDICATIONS:  Current Outpatient Medications  Medication Sig Dispense Refill  . acetaminophen (TYLENOL) 500 MG tablet Take 500 mg by mouth every 6 (six) hours as needed for fever.    [name] aspirin EC 81 MG tablet Take 81 mg by mouth at bedtime.    . carvedilol (COREG) 12.5 MG tablet Take 1 tablet (12.5 mg total) by mouth 2 (two) times daily with a meal. 180 tablet 3  . cetirizine (ZYRTEC) 10 MG tablet Take 1 tablet (10 mg total) by mouth daily. 30 tablet 11  . clopidogrel (PLAVIX) 75 MG tablet Take 1 tablet (75 mg total) by mouth daily. 90 tablet 3  . folic acid (FOLVITE) 1 MG tablet Take 1 tablet (1 mg total) by mouth daily. 30 tablet 4  . glimepiride (AMARYL) 4 MG tablet Take 1 tablet (4 mg total) by mouth daily. 90 tablet 1  . HYDROcodone-homatropine (HYCODAN) 5-1.5 MG/5ML syrup Take 5 mLs by mouth every 8 (eight) hours as needed for cough. (Patient not taking: Reported on 08/09/2019) 120 mL 0  . Hydrocortisone Acetate 1 % CREA Apply 15 g topically 2 (two) times daily. 15 g 3  . ibuprofen (ADVIL) 200 MG tablet Take 600 mg by mouth once.    [name] ipratropium (ATROVENT) 0.03 % nasal spray USE 2 SPRAYS IN EACH NOSTRIL TWICE DAILY 30 mL 2  . lactulose (CHRONULAC) 10 GM/15ML solution Take 30 mLs (20 g total) by mouth 2 (two) times daily as needed for moderate constipation. 240 mL 3  . lidocaine-prilocaine (EMLA) cream APPLY TO  THE PORT-A-CATH SITE 30-60 MINUTES BEFORE TREATMENT. 30 g 0  . lisinopril (ZESTRIL) 10 MG tablet Take 1 tablet (10 mg total) by mouth daily. 90 tablet 1  . metFORMIN (GLUCOPHAGE) 1000 MG tablet Take 1 tablet (1,000 mg total) by mouth 2 (two) times daily with a meal. 180 tablet 1  . multivitamin (ONE-A-DAY MEN'S) TABS tablet Take 1 tablet by mouth daily. 30 tablet 6  . nitroGLYCERIN (NITROSTAT) 0.4 MG SL tablet Place 1 tablet (0.4 mg total) under the tongue every 5 (five) minutes x 3 doses as needed for chest pain. (Patient not taking: Reported on 08/09/2019) 25  tablet 2  . oxyCODONE-acetaminophen (PERCOCET/ROXICET) 5-325 MG tablet Take 1 tablet by mouth every 6 (six) hours as needed for severe pain. 30 tablet 0  . pantoprazole (PROTONIX) 40 MG tablet TAKE 1 TABLET BY MOUTH EVERY DAY 30 tablet 5  . prochlorperazine (COMPAZINE) 10 MG tablet Take 1 tablet (10 mg total) by mouth every 6 (six) hours as needed for nausea or vomiting. 30 tablet 0  . rosuvastatin (CRESTOR) 40 MG tablet TAKE 1 TABLET BY MOUTH DAILY 90 tablet 0  . sucralfate (CARAFATE) 1 GM/10ML suspension Take 10 mLs (1 g total) by mouth 4 (four) times daily. 420 mL 1   No current facility-administered medications for this visit.    SURGICAL HISTORY:  Past Surgical History:  Procedure Laterality Date  . CARDIAC CATHETERIZATION N/A 09/05/2015   Procedure: Left Heart Cath and Coronary Angiography;  Surgeon: Troy Sine, MD;  Location: [facility];  Service: Cardiovascular;  Laterality: N/A;  . CARDIAC CATHETERIZATION N/A 09/05/2015   Procedure: Coronary Stent Intervention;  Surgeon: Troy Sine, MD;  Location: [facility];  Service: Cardiovascular;  Laterality: N/A;  . IR IMAGING GUIDED PORT INSERTION  07/15/2019  . 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 PERIPHERAL VASCULAR CATHETERIZATION Bilateral 2003   "stenting"  . TONSILLECTOMY  ~ 1963    REVIEW OF SYSTEMS:  A comprehensive review of systems was negative except for: Constitutional: positive for fatigue   PHYSICAL EXAMINATION: General appearance: alert, cooperative, appears stated age, fatigued and no distress Head: Normocephalic, without obvious abnormality, atraumatic Neck: no adenopathy, no JVD, supple, symmetrical, trachea midline and thyroid not enlarged, symmetric, no tenderness/mass/nodules Lymph nodes: Cervical, supraclavicular, and axillary nodes normal. Resp: clear to auscultation bilaterally Back: symmetric, no curvature. ROM normal. No CVA tenderness. Cardio: regular rate  and rhythm, S1, S2 normal, no murmur, click, rub or gallop GI: soft, non-tender; bowel sounds normal; no masses,  no organomegaly Extremities: extremities normal, atraumatic, no cyanosis or edema  ECOG PERFORMANCE STATUS: 1 - Symptomatic but completely ambulatory  Blood pressure (!) 126/52, pulse 88, temperature 98 F (36.7 C), temperature source Temporal, resp. rate 18, height 6\' 3"  (1.905 m), weight 211 lb 9.6 oz (96 kg), SpO2 98 %.  LABORATORY DATA: Lab Results  Component Value Date   WBC 4.7 08/25/2019   HGB 10.1 (L) 08/25/2019   HCT 31.9 (L) 08/25/2019   MCV 87.9 08/25/2019   PLT 277 08/25/2019      Chemistry      Component Value Date/Time   NA 140 08/25/2019 1110   NA 140 06/08/2015 0840   K 3.9 08/25/2019 1110   CL 105 08/25/2019 1110   CO2 22 08/25/2019 1110   BUN 9 08/25/2019 1110   BUN 15 06/08/2015 0840   CREATININE 1.03 08/25/2019 1110   CREATININE 1.06 10/01/2016  0865      Component Value Date/Time   CALCIUM 9.1 08/25/2019 1110   ALKPHOS 58 08/25/2019 1110   AST 18 08/25/2019 1110   ALT 15 08/25/2019 1110   BILITOT 0.2 (L) 08/25/2019 1110       RADIOGRAPHIC STUDIES: No results found.  ASSESSMENT AND PLAN: This is a very pleasant 65 years old African-American male recently diagnosed with a stage IV (TX, N3, M1c) non-small cell lung cancer, poorly differentiated adenocarcinoma presented with bulky mediastinal as well as right hilar, supraclavicular, abdominal and right axillary lymphadenopathy in addition to metastatic liver lesions diagnosed in January 2021. Molecular studies by [employer] 360 shows no actionable mutations.  The patient is not a candidate for treatment with targeted therapy or enrollment in the clinical trial with the [facility] regimen. The patient is currently undergoing systemic chemotherapy with carboplatin for AUC of 5, Alimta 500 mg/M2 and Keytruda 200 mg IV every 3 weeks status post 2 cycles. He tolerated the second cycle of his  treatment fairly well. I recommended for the patient to proceed with cycle #3 today as planned. I will see him back for follow-up visit in 3 weeks for evaluation with repeat CT scan of the chest, abdomen pelvis for restaging of his disease. For pain management, the patient will continue on Percocet 5/325 mg p.o. every 6 hours as needed. For constipation he will continue on MiraLAX on as-needed basis. The patient was advised to call immediately if he has any concerning symptoms in the interval. The patient voices understanding of current disease status and treatment options and is in agreement with the current care plan.  All questions were answered. The patient knows to call the clinic with any problems, questions or concerns. We can certainly see the patient much sooner if necessary.  Disclaimer: This note was dictated with voice recognition software. Similar sounding words can inadvertently be transcribed and may not be corrected upon review.

## 2019-08-26 ENCOUNTER — Telehealth: Payer: Self-pay | Admitting: Internal Medicine

## 2019-08-26 NOTE — Telephone Encounter (Signed)
Scheduled per los. Called and left msg. Mailed printout  °

## 2019-09-01 ENCOUNTER — Other Ambulatory Visit: Payer: Self-pay

## 2019-09-01 ENCOUNTER — Inpatient Hospital Stay: Payer: Medicare Other

## 2019-09-01 ENCOUNTER — Inpatient Hospital Stay: Payer: Medicare Other | Attending: Internal Medicine

## 2019-09-01 DIAGNOSIS — Z7984 Long term (current) use of oral hypoglycemic drugs: Secondary | ICD-10-CM | POA: Insufficient documentation

## 2019-09-01 DIAGNOSIS — I739 Peripheral vascular disease, unspecified: Secondary | ICD-10-CM | POA: Insufficient documentation

## 2019-09-01 DIAGNOSIS — Z79899 Other long term (current) drug therapy: Secondary | ICD-10-CM | POA: Diagnosis not present

## 2019-09-01 DIAGNOSIS — Z7982 Long term (current) use of aspirin: Secondary | ICD-10-CM | POA: Diagnosis not present

## 2019-09-01 DIAGNOSIS — K5900 Constipation, unspecified: Secondary | ICD-10-CM | POA: Diagnosis not present

## 2019-09-01 DIAGNOSIS — M1990 Unspecified osteoarthritis, unspecified site: Secondary | ICD-10-CM | POA: Insufficient documentation

## 2019-09-01 DIAGNOSIS — C787 Secondary malignant neoplasm of liver and intrahepatic bile duct: Secondary | ICD-10-CM | POA: Insufficient documentation

## 2019-09-01 DIAGNOSIS — E1151 Type 2 diabetes mellitus with diabetic peripheral angiopathy without gangrene: Secondary | ICD-10-CM | POA: Diagnosis not present

## 2019-09-01 DIAGNOSIS — C3491 Malignant neoplasm of unspecified part of right bronchus or lung: Secondary | ICD-10-CM | POA: Diagnosis not present

## 2019-09-01 DIAGNOSIS — I10 Essential (primary) hypertension: Secondary | ICD-10-CM | POA: Diagnosis not present

## 2019-09-01 DIAGNOSIS — Z5111 Encounter for antineoplastic chemotherapy: Secondary | ICD-10-CM | POA: Insufficient documentation

## 2019-09-01 DIAGNOSIS — R5383 Other fatigue: Secondary | ICD-10-CM | POA: Insufficient documentation

## 2019-09-01 DIAGNOSIS — Z791 Long term (current) use of non-steroidal anti-inflammatories (NSAID): Secondary | ICD-10-CM | POA: Diagnosis not present

## 2019-09-01 DIAGNOSIS — Z95828 Presence of other vascular implants and grafts: Secondary | ICD-10-CM

## 2019-09-01 DIAGNOSIS — E785 Hyperlipidemia, unspecified: Secondary | ICD-10-CM | POA: Insufficient documentation

## 2019-09-01 DIAGNOSIS — Z452 Encounter for adjustment and management of vascular access device: Secondary | ICD-10-CM | POA: Diagnosis not present

## 2019-09-01 DIAGNOSIS — Z5112 Encounter for antineoplastic immunotherapy: Secondary | ICD-10-CM | POA: Diagnosis present

## 2019-09-01 DIAGNOSIS — C3490 Malignant neoplasm of unspecified part of unspecified bronchus or lung: Secondary | ICD-10-CM

## 2019-09-01 LAB — CBC WITH DIFFERENTIAL (CANCER CENTER ONLY)
Abs Immature Granulocytes: 0 10*3/uL (ref 0.00–0.07)
Basophils Absolute: 0 10*3/uL (ref 0.0–0.1)
Basophils Relative: 1 %
Eosinophils Absolute: 0 10*3/uL (ref 0.0–0.5)
Eosinophils Relative: 1 %
HCT: 30.6 % — ABNORMAL LOW (ref 39.0–52.0)
Hemoglobin: 10 g/dL — ABNORMAL LOW (ref 13.0–17.0)
Immature Granulocytes: 0 %
Lymphocytes Relative: 58 %
Lymphs Abs: 1.6 10*3/uL (ref 0.7–4.0)
MCH: 28.5 pg (ref 26.0–34.0)
MCHC: 32.7 g/dL (ref 30.0–36.0)
MCV: 87.2 fL (ref 80.0–100.0)
Monocytes Absolute: 0.2 10*3/uL (ref 0.1–1.0)
Monocytes Relative: 7 %
Neutro Abs: 0.9 10*3/uL — ABNORMAL LOW (ref 1.7–7.7)
Neutrophils Relative %: 33 %
Platelet Count: 213 10*3/uL (ref 150–400)
RBC: 3.51 MIL/uL — ABNORMAL LOW (ref 4.22–5.81)
RDW: 15.9 % — ABNORMAL HIGH (ref 11.5–15.5)
WBC Count: 2.8 10*3/uL — ABNORMAL LOW (ref 4.0–10.5)
nRBC: 0 % (ref 0.0–0.2)

## 2019-09-01 LAB — CMP (CANCER CENTER ONLY)
ALT: 19 U/L (ref 0–44)
AST: 19 U/L (ref 15–41)
Albumin: 4.1 g/dL (ref 3.5–5.0)
Alkaline Phosphatase: 58 U/L (ref 38–126)
Anion gap: 11 (ref 5–15)
BUN: 16 mg/dL (ref 8–23)
CO2: 24 mmol/L (ref 22–32)
Calcium: 9.4 mg/dL (ref 8.9–10.3)
Chloride: 104 mmol/L (ref 98–111)
Creatinine: 0.92 mg/dL (ref 0.61–1.24)
GFR, Est AFR Am: >60 mL/min (ref >60)
GFR, Estimated: >60 mL/min (ref >60)
Glucose, Bld: 170 mg/dL — ABNORMAL HIGH (ref 70–99)
Potassium: 4 mmol/L (ref 3.5–5.1)
Sodium: 139 mmol/L (ref 135–145)
Total Bilirubin: 0.6 mg/dL (ref 0.3–1.2)
Total Protein: 7.7 g/dL (ref 6.5–8.1)

## 2019-09-01 MED ORDER — HEPARIN SOD (PORK) LOCK FLUSH 100 UNIT/ML IV SOLN
500.0000 [IU] | Freq: Once | INTRAVENOUS | Status: AC
  Administered 2019-09-01: 10:00:00 500 [IU] via INTRAVENOUS
  Filled 2019-09-01: qty 5

## 2019-09-01 MED ORDER — SODIUM CHLORIDE 0.9% FLUSH
10.0000 mL | INTRAVENOUS | Status: DC | PRN
  Administered 2019-09-01: 10 mL via INTRAVENOUS
  Filled 2019-09-01: qty 10

## 2019-09-01 NOTE — Patient Instructions (Signed)

## 2019-09-02 ENCOUNTER — Other Ambulatory Visit: Payer: Self-pay | Admitting: Medical Oncology

## 2019-09-02 DIAGNOSIS — C787 Secondary malignant neoplasm of liver and intrahepatic bile duct: Secondary | ICD-10-CM

## 2019-09-02 DIAGNOSIS — C3490 Malignant neoplasm of unspecified part of unspecified bronchus or lung: Secondary | ICD-10-CM

## 2019-09-02 MED ORDER — PROCHLORPERAZINE MALEATE 10 MG PO TABS: 10.0000 mg | ORAL_TABLET | Freq: Four times a day (QID) | ORAL | 1 refills | Status: DC | PRN

## 2019-09-08 ENCOUNTER — Inpatient Hospital Stay: Payer: Medicare Other

## 2019-09-08 ENCOUNTER — Other Ambulatory Visit: Payer: Self-pay

## 2019-09-08 DIAGNOSIS — C3490 Malignant neoplasm of unspecified part of unspecified bronchus or lung: Secondary | ICD-10-CM

## 2019-09-08 DIAGNOSIS — Z95828 Presence of other vascular implants and grafts: Secondary | ICD-10-CM

## 2019-09-08 DIAGNOSIS — Z5111 Encounter for antineoplastic chemotherapy: Secondary | ICD-10-CM | POA: Diagnosis not present

## 2019-09-08 LAB — CBC WITH DIFFERENTIAL (CANCER CENTER ONLY)
Abs Immature Granulocytes: 0 10*3/uL (ref 0.00–0.07)
Basophils Absolute: 0 10*3/uL (ref 0.0–0.1)
Basophils Relative: 0 %
Eosinophils Absolute: 0 10*3/uL (ref 0.0–0.5)
Eosinophils Relative: 1 %
HCT: 28.8 % — ABNORMAL LOW (ref 39.0–52.0)
Hemoglobin: 9.4 g/dL — ABNORMAL LOW (ref 13.0–17.0)
Immature Granulocytes: 0 %
Lymphocytes Relative: 54 %
Lymphs Abs: 1.6 10*3/uL (ref 0.7–4.0)
MCH: 28.4 pg (ref 26.0–34.0)
MCHC: 32.6 g/dL (ref 30.0–36.0)
MCV: 87 fL (ref 80.0–100.0)
Monocytes Absolute: 0.6 10*3/uL (ref 0.1–1.0)
Monocytes Relative: 19 %
Neutro Abs: 0.8 10*3/uL — ABNORMAL LOW (ref 1.7–7.7)
Neutrophils Relative %: 26 %
Platelet Count: 112 10*3/uL — ABNORMAL LOW (ref 150–400)
RBC: 3.31 MIL/uL — ABNORMAL LOW (ref 4.22–5.81)
RDW: 16.8 % — ABNORMAL HIGH (ref 11.5–15.5)
WBC Count: 3 10*3/uL — ABNORMAL LOW (ref 4.0–10.5)
nRBC: 0 % (ref 0.0–0.2)

## 2019-09-08 LAB — CMP (CANCER CENTER ONLY)
ALT: 19 U/L (ref 0–44)
AST: 18 U/L (ref 15–41)
Albumin: 3.9 g/dL (ref 3.5–5.0)
Alkaline Phosphatase: 62 U/L (ref 38–126)
Anion gap: 12 (ref 5–15)
BUN: 10 mg/dL (ref 8–23)
CO2: 23 mmol/L (ref 22–32)
Calcium: 9 mg/dL (ref 8.9–10.3)
Chloride: 105 mmol/L (ref 98–111)
Creatinine: 1.04 mg/dL (ref 0.61–1.24)
GFR, Est AFR Am: >60 mL/min (ref >60)
GFR, Estimated: >60 mL/min (ref >60)
Glucose, Bld: 199 mg/dL — ABNORMAL HIGH (ref 70–99)
Potassium: 4 mmol/L (ref 3.5–5.1)
Sodium: 140 mmol/L (ref 135–145)
Total Bilirubin: <0.2 mg/dL — ABNORMAL LOW (ref 0.3–1.2)
Total Protein: 7.3 g/dL (ref 6.5–8.1)

## 2019-09-08 MED ORDER — SODIUM CHLORIDE 0.9% FLUSH
10.0000 mL | INTRAVENOUS | Status: DC | PRN
  Administered 2019-09-08: 10 mL via INTRAVENOUS
  Filled 2019-09-08: qty 10

## 2019-09-08 MED ORDER — HEPARIN SOD (PORK) LOCK FLUSH 100 UNIT/ML IV SOLN
500.0000 [IU] | Freq: Once | INTRAVENOUS | Status: AC
  Administered 2019-09-08: 500 [IU] via INTRAVENOUS
  Filled 2019-09-08: qty 5

## 2019-09-09 NOTE — Progress Notes (Signed)
Pharmacist Chemotherapy Monitoring - Follow Up Assessment    I verify that I have reviewed each item in the below checklist:  . Regimen for the patient is scheduled for the appropriate day and plan matches scheduled date. [name] Appropriate non-routine labs are ordered dependent on drug ordered. . If applicable, additional medications reviewed and ordered per protocol based on lifetime cumulative doses and/or treatment regimen.   Plan for follow-up and/or issues identified: No . I-vent associated with next due treatment: No . MD and/or nursing notified: No   Kennith Center, Pharm.D., CPP 09/09/2019@10 :02 AM

## 2019-09-13 ENCOUNTER — Other Ambulatory Visit: Payer: Self-pay | Admitting: Cardiovascular Disease

## 2019-09-13 ENCOUNTER — Other Ambulatory Visit: Payer: Self-pay | Admitting: Family Medicine

## 2019-09-13 ENCOUNTER — Ambulatory Visit (HOSPITAL_COMMUNITY)
Admission: RE | Admit: 2019-09-13 | Discharge: 2019-09-13 | Disposition: A | Discharge status: Home / Self Care | Payer: Medicare Other | Source: Ambulatory Visit | Attending: Internal Medicine | Admitting: Internal Medicine

## 2019-09-13 ENCOUNTER — Other Ambulatory Visit: Payer: Self-pay

## 2019-09-13 ENCOUNTER — Encounter (HOSPITAL_COMMUNITY): Payer: Self-pay

## 2019-09-13 DIAGNOSIS — C3490 Malignant neoplasm of unspecified part of unspecified bronchus or lung: Secondary | ICD-10-CM | POA: Insufficient documentation

## 2019-09-13 DIAGNOSIS — E119 Type 2 diabetes mellitus without complications: Secondary | ICD-10-CM

## 2019-09-13 DIAGNOSIS — I10 Essential (primary) hypertension: Secondary | ICD-10-CM

## 2019-09-13 MED ORDER — SODIUM CHLORIDE (PF) 0.9 % IJ SOLN
INTRAMUSCULAR | Status: AC
  Filled 2019-09-13: qty 50

## 2019-09-13 MED ORDER — IOHEXOL 300 MG/ML  SOLN
100.0000 mL | Freq: Once | INTRAMUSCULAR | Status: AC | PRN
  Administered 2019-09-13: 100 mL via INTRAVENOUS

## 2019-09-15 ENCOUNTER — Inpatient Hospital Stay: Payer: Medicare Other

## 2019-09-15 ENCOUNTER — Other Ambulatory Visit: Payer: Self-pay

## 2019-09-15 ENCOUNTER — Encounter: Payer: Self-pay | Admitting: Internal Medicine

## 2019-09-15 ENCOUNTER — Inpatient Hospital Stay (HOSPITAL_BASED_OUTPATIENT_CLINIC_OR_DEPARTMENT_OTHER): Payer: Medicare Other | Admitting: Internal Medicine

## 2019-09-15 VITALS — BP 125/58 | HR 87 | Temp 98.0°F | Resp 18 | Ht 75.0 in | Wt 211.2 lb

## 2019-09-15 DIAGNOSIS — I2510 Atherosclerotic heart disease of native coronary artery without angina pectoris: Secondary | ICD-10-CM

## 2019-09-15 DIAGNOSIS — I10 Essential (primary) hypertension: Secondary | ICD-10-CM | POA: Diagnosis not present

## 2019-09-15 DIAGNOSIS — C3490 Malignant neoplasm of unspecified part of unspecified bronchus or lung: Secondary | ICD-10-CM

## 2019-09-15 DIAGNOSIS — Z5111 Encounter for antineoplastic chemotherapy: Secondary | ICD-10-CM

## 2019-09-15 DIAGNOSIS — C3491 Malignant neoplasm of unspecified part of right bronchus or lung: Secondary | ICD-10-CM | POA: Diagnosis not present

## 2019-09-15 DIAGNOSIS — R5382 Chronic fatigue, unspecified: Secondary | ICD-10-CM

## 2019-09-15 DIAGNOSIS — Z5112 Encounter for antineoplastic immunotherapy: Secondary | ICD-10-CM

## 2019-09-15 DIAGNOSIS — C787 Secondary malignant neoplasm of liver and intrahepatic bile duct: Secondary | ICD-10-CM | POA: Diagnosis not present

## 2019-09-15 DIAGNOSIS — Z95828 Presence of other vascular implants and grafts: Secondary | ICD-10-CM

## 2019-09-15 LAB — CMP (CANCER CENTER ONLY)
ALT: 17 U/L (ref 0–44)
AST: 20 U/L (ref 15–41)
Albumin: 3.9 g/dL (ref 3.5–5.0)
Alkaline Phosphatase: 59 U/L (ref 38–126)
Anion gap: 12 (ref 5–15)
BUN: 9 mg/dL (ref 8–23)
CO2: 23 mmol/L (ref 22–32)
Calcium: 9.2 mg/dL (ref 8.9–10.3)
Chloride: 106 mmol/L (ref 98–111)
Creatinine: 1 mg/dL (ref 0.61–1.24)
GFR, Est AFR Am: >60 mL/min (ref >60)
GFR, Estimated: >60 mL/min (ref >60)
Glucose, Bld: 207 mg/dL — ABNORMAL HIGH (ref 70–99)
Potassium: 3.8 mmol/L (ref 3.5–5.1)
Sodium: 141 mmol/L (ref 135–145)
Total Bilirubin: 0.3 mg/dL (ref 0.3–1.2)
Total Protein: 7.5 g/dL (ref 6.5–8.1)

## 2019-09-15 LAB — CBC WITH DIFFERENTIAL (CANCER CENTER ONLY)
Abs Immature Granulocytes: 0 10*3/uL (ref 0.00–0.07)
Basophils Absolute: 0 10*3/uL (ref 0.0–0.1)
Basophils Relative: 1 %
Eosinophils Absolute: 0.1 10*3/uL (ref 0.0–0.5)
Eosinophils Relative: 2 %
HCT: 30.5 % — ABNORMAL LOW (ref 39.0–52.0)
Hemoglobin: 9.8 g/dL — ABNORMAL LOW (ref 13.0–17.0)
Immature Granulocytes: 0 %
Lymphocytes Relative: 56 %
Lymphs Abs: 2.1 10*3/uL (ref 0.7–4.0)
MCH: 29.3 pg (ref 26.0–34.0)
MCHC: 32.1 g/dL (ref 30.0–36.0)
MCV: 91 fL (ref 80.0–100.0)
Monocytes Absolute: 0.5 10*3/uL (ref 0.1–1.0)
Monocytes Relative: 14 %
Neutro Abs: 1 10*3/uL — ABNORMAL LOW (ref 1.7–7.7)
Neutrophils Relative %: 27 %
Platelet Count: 255 10*3/uL (ref 150–400)
RBC: 3.35 MIL/uL — ABNORMAL LOW (ref 4.22–5.81)
RDW: 18.5 % — ABNORMAL HIGH (ref 11.5–15.5)
WBC Count: 3.7 10*3/uL — ABNORMAL LOW (ref 4.0–10.5)
nRBC: 0 % (ref 0.0–0.2)

## 2019-09-15 LAB — TSH: TSH: 0.132 u[IU]/mL — ABNORMAL LOW (ref 0.320–4.118)

## 2019-09-15 MED ORDER — SODIUM CHLORIDE 0.9 % IV SOLN
150.0000 mg | Freq: Once | INTRAVENOUS | Status: AC
  Administered 2019-09-15: 150 mg via INTRAVENOUS
  Filled 2019-09-15: qty 150

## 2019-09-15 MED ORDER — PALONOSETRON HCL INJECTION 0.25 MG/5ML
INTRAVENOUS | Status: AC
  Filled 2019-09-15: qty 5

## 2019-09-15 MED ORDER — SODIUM CHLORIDE 0.9% FLUSH
10.0000 mL | INTRAVENOUS | Status: DC | PRN
  Administered 2019-09-15: 10 mL via INTRAVENOUS
  Filled 2019-09-15: qty 10

## 2019-09-15 MED ORDER — SODIUM CHLORIDE 0.9 % IV SOLN
10.0000 mg | Freq: Once | INTRAVENOUS | Status: AC
  Administered 2019-09-15: 10 mg via INTRAVENOUS
  Filled 2019-09-15: qty 10

## 2019-09-15 MED ORDER — SODIUM CHLORIDE 0.9 % IV SOLN
Freq: Once | INTRAVENOUS | Status: AC
  Filled 2019-09-15: qty 250

## 2019-09-15 MED ORDER — HEPARIN SOD (PORK) LOCK FLUSH 100 UNIT/ML IV SOLN
500.0000 [IU] | Freq: Once | INTRAVENOUS | Status: AC | PRN
  Administered 2019-09-15: 16:00:00 500 [IU]
  Filled 2019-09-15: qty 5

## 2019-09-15 MED ORDER — SODIUM CHLORIDE 0.9 % IV SOLN
495.2000 mg | Freq: Once | INTRAVENOUS | Status: AC
  Administered 2019-09-15: 500 mg via INTRAVENOUS
  Filled 2019-09-15: qty 50

## 2019-09-15 MED ORDER — SODIUM CHLORIDE 0.9 % IV SOLN
200.0000 mg | Freq: Once | INTRAVENOUS | Status: AC
  Administered 2019-09-15: 14:00:00 200 mg via INTRAVENOUS
  Filled 2019-09-15: qty 8

## 2019-09-15 MED ORDER — SODIUM CHLORIDE 0.9 % IV SOLN
500.0000 mg/m2 | Freq: Once | INTRAVENOUS | Status: AC
  Administered 2019-09-15: 1100 mg via INTRAVENOUS
  Filled 2019-09-15: qty 40

## 2019-09-15 MED ORDER — SODIUM CHLORIDE 0.9% FLUSH
10.0000 mL | INTRAVENOUS | Status: DC | PRN
  Administered 2019-09-15: 10 mL
  Filled 2019-09-15: qty 10

## 2019-09-15 MED ORDER — PALONOSETRON HCL INJECTION 0.25 MG/5ML
0.2500 mg | Freq: Once | INTRAVENOUS | Status: AC
  Administered 2019-09-15: 13:00:00 0.25 mg via INTRAVENOUS

## 2019-09-15 NOTE — Progress Notes (Signed)
Per Dr [name] , it is okay to treat pt today with [facility].

## 2019-09-15 NOTE — Patient Instructions (Signed)
[facility] Discharge Instructions for Patients Receiving Chemotherapy  Today you received the following chemotherapy agents: pembrolizumab, pemetrexed, carboplatin.  To help prevent nausea and vomiting after your treatment, we encourage you to take your nausea medication as directed.   If you develop nausea and vomiting that is not controlled by your nausea medication, call the clinic.   BELOW ARE SYMPTOMS THAT SHOULD BE REPORTED IMMEDIATELY:  *FEVER GREATER THAN 100.5 F  *CHILLS WITH OR WITHOUT FEVER  NAUSEA AND VOMITING THAT IS NOT CONTROLLED WITH YOUR NAUSEA MEDICATION  *UNUSUAL SHORTNESS OF BREATH  *UNUSUAL BRUISING OR BLEEDING  TENDERNESS IN MOUTH AND THROAT WITH OR WITHOUT PRESENCE OF ULCERS  *URINARY PROBLEMS  *BOWEL PROBLEMS  UNUSUAL RASH Items with * indicate a potential emergency and should be followed up as soon as possible.  Feel free to call the clinic should you have any questions or concerns. The clinic phone number is [phone].  Please show the [facility] at check-in to the Emergency Department and triage nurse.

## 2019-09-15 NOTE — Patient Instructions (Signed)

## 2019-09-15 NOTE — Progress Notes (Signed)
[facility] [phone]) [phone]   [phone]  OFFICE PROGRESS NOTE  [name], FNP [name] [address]  DIAGNOSIS: Stage IV (TX, N3, M1c) non-small cell lung cancer, poorly differentiated adenocarcinoma presented with bulky mediastinal lymphadenopathy in addition to right axillary lymphadenopathy and metastatic liver lesions, abdominal lymphadenopathy diagnosed in January 2021. Molecular studies by [employer] that shows no actionable mutations.  PRIOR THERAPY: None  CURRENT THERAPY: Systemic chemotherapy with carboplatin for AUC of 5, Alimta 500 mg/M2 and Keytruda 200 mg IV every 3 weeks.  First dose July 14, 2019.  Status post 3 cycles.  INTERVAL HISTORY: Kirk [name] 64 y.o. male returns to the clinic today for follow-up visit.  The patient is feeling fine today with no concerning complaints.  He notes improvement of his dysphagia few weeks ago.  He denied having any current chest pain, shortness of breath, cough or hemoptysis.  He denied having any fever or chills.  He has no nausea, vomiting, diarrhea or constipation.  He denied having any headache or visual changes.  He continues to tolerate his treatment with carboplatin, Alimta and Keytruda fairly well.  The patient had repeat CT scan of the chest, abdomen pelvis performed recently and he is here for evaluation and discussion of his scan results.  MEDICAL HISTORY: Past Medical History:  Diagnosis Date  . Arthritis    "right knee" (09/04/2015)  . Atrial flutter with rapid ventricular response ([facility]) 04/19/2019  . Dyspnea    increased exertion  . HTN (hypertension)   . Hyperlipidemia   . Lung cancer ([facility]) 05/2019  . NSTEMI (non-ST elevated myocardial infarction) ([facility]) 09/04/2015   a. cath 09/05/2015: 95% stenosis mid-LAD, 55% mid-RCA, and 40% prox-RCA. PCI performed w/ Synergy DES to LAD  . PAD (peripheral artery disease) ([facility])    Stenting of bilateral iliacs in  2003.  [name] Refusal of blood transfusions as patient is Jehovah's Witness   . Type II diabetes mellitus ([facility])     ALLERGIES:  has No Known Allergies.  MEDICATIONS:  Current Outpatient Medications  Medication Sig Dispense Refill  . acetaminophen (TYLENOL) 500 MG tablet Take 500 mg by mouth every 6 (six) hours as needed for fever.    [name] aspirin EC 81 MG tablet Take 81 mg by mouth at bedtime.    . carvedilol (COREG) 12.5 MG tablet Take 1 tablet (12.5 mg total) by mouth 2 (two) times daily with a meal. 180 tablet 3  . cetirizine (ZYRTEC) 10 MG tablet Take 1 tablet (10 mg total) by mouth daily. 30 tablet 11  . clopidogrel (PLAVIX) 75 MG tablet Take 1 tablet (75 mg total) by mouth daily. 90 tablet 3  . folic acid (FOLVITE) 1 MG tablet Take 1 tablet (1 mg total) by mouth daily. 30 tablet 4  . glimepiride (AMARYL) 4 MG tablet TAKE 1 TABLET BY MOUTH EVERY DAY 90 tablet 1  . HYDROcodone-homatropine (HYCODAN) 5-1.5 MG/5ML syrup Take 5 mLs by mouth every 8 (eight) hours as needed for cough. (Patient not taking: Reported on 08/09/2019) 120 mL 0  . Hydrocortisone Acetate 1 % CREA Apply 15 g topically 2 (two) times daily. 15 g 3  . ibuprofen (ADVIL) 200 MG tablet Take 600 mg by mouth once.    [name] ipratropium (ATROVENT) 0.03 % nasal spray USE 2 SPRAYS IN EACH NOSTRIL TWICE DAILY 30 mL 2  . lactulose (CHRONULAC) 10 GM/15ML solution Take 30 mLs (20 g total) by  mouth 2 (two) times daily as needed for moderate constipation. 240 mL 3  . lidocaine-prilocaine (EMLA) cream APPLY TO THE PORT-A-CATH SITE 30-60 MINUTES BEFORE TREATMENT. 30 g 0  . lisinopril (ZESTRIL) 10 MG tablet TAKE 1 TABLET BY MOUTH EVERY DAY 90 tablet 1  . metFORMIN (GLUCOPHAGE) 1000 MG tablet TAKE 1 TABLET (1,000 MG TOTAL) BY MOUTH 2 (TWO) TIMES DAILY WITH A MEAL. 180 tablet 1  . multivitamin (ONE-A-DAY MEN'S) TABS tablet Take 1 tablet by mouth daily. 30 tablet 6  . nitroGLYCERIN (NITROSTAT) 0.4 MG SL tablet Place 1 tablet (0.4 mg total) under the  tongue every 5 (five) minutes x 3 doses as needed for chest pain. (Patient not taking: Reported on 08/09/2019) 25 tablet 2  . oxyCODONE-acetaminophen (PERCOCET/ROXICET) 5-325 MG tablet Take 1 tablet by mouth every 6 (six) hours as needed for severe pain. 30 tablet 0  . pantoprazole (PROTONIX) 40 MG tablet TAKE 1 TABLET BY MOUTH EVERY DAY 30 tablet 5  . prochlorperazine (COMPAZINE) 10 MG tablet Take 1 tablet (10 mg total) by mouth every 6 (six) hours as needed for nausea or vomiting. 30 tablet 1  . rosuvastatin (CRESTOR) 40 MG tablet TAKE 1 TABLET BY MOUTH DAILY 90 tablet 3  . sucralfate (CARAFATE) 1 GM/10ML suspension Take 10 mLs (1 g total) by mouth 4 (four) times daily. 420 mL 1   No current facility-administered medications for this visit.    SURGICAL HISTORY:  Past Surgical History:  Procedure Laterality Date  . CARDIAC CATHETERIZATION N/A 09/05/2015   Procedure: Left Heart Cath and Coronary Angiography;  Surgeon: Troy Sine, MD;  Location: [facility];  Service: Cardiovascular;  Laterality: N/A;  . CARDIAC CATHETERIZATION N/A 09/05/2015   Procedure: Coronary Stent Intervention;  Surgeon: Troy Sine, MD;  Location: [facility];  Service: Cardiovascular;  Laterality: N/A;  . IR IMAGING GUIDED PORT INSERTION  07/15/2019  . 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 PERIPHERAL VASCULAR CATHETERIZATION Bilateral 2003   "stenting"  . TONSILLECTOMY  ~ 1963    REVIEW OF SYSTEMS:  Constitutional: positive for fatigue Eyes: negative Ears, nose, mouth, throat, and face: negative Respiratory: negative Cardiovascular: negative Gastrointestinal: negative Genitourinary:negative Integument/breast: negative Hematologic/lymphatic: negative Musculoskeletal:negative Neurological: negative Behavioral/Psych: negative Endocrine: negative Allergic/Immunologic: negative   PHYSICAL EXAMINATION: General appearance: alert, cooperative, appears stated age,  fatigued and no distress Head: Normocephalic, without obvious abnormality, atraumatic Neck: no adenopathy, no JVD, supple, symmetrical, trachea midline and thyroid not enlarged, symmetric, no tenderness/mass/nodules Lymph nodes: Cervical, supraclavicular, and axillary nodes normal. Resp: clear to auscultation bilaterally Back: symmetric, no curvature. ROM normal. No CVA tenderness. Cardio: regular rate and rhythm, S1, S2 normal, no murmur, click, rub or gallop GI: soft, non-tender; bowel sounds normal; no masses,  no organomegaly Extremities: extremities normal, atraumatic, no cyanosis or edema Neurologic: Alert and oriented X 3, normal strength and tone. Normal symmetric reflexes. Normal coordination and gait  ECOG PERFORMANCE STATUS: 1 - Symptomatic but completely ambulatory  Blood pressure (!) 125/58, pulse 87, temperature 98 F (36.7 C), temperature source Temporal, resp. rate 18, height 6\' 3"  (1.905 m), weight 211 lb 3.2 oz (95.8 kg), SpO2 100 %.  LABORATORY DATA: Lab Results  Component Value Date   WBC 3.7 (L) 09/15/2019   HGB 9.8 (L) 09/15/2019   HCT 30.5 (L) 09/15/2019   MCV 91.0 09/15/2019   PLT 255 09/15/2019      Chemistry  Component Value Date/Time   NA 141 09/15/2019 1120   NA 140 06/08/2015 0840   K 3.8 09/15/2019 1120   CL 106 09/15/2019 1120   CO2 23 09/15/2019 1120   BUN 9 09/15/2019 1120   BUN 15 06/08/2015 0840   CREATININE 1.00 09/15/2019 1120   CREATININE 1.06 10/01/2016 0855      Component Value Date/Time   CALCIUM 9.2 09/15/2019 1120   ALKPHOS 59 09/15/2019 1120   AST 20 09/15/2019 1120   ALT 17 09/15/2019 1120   BILITOT 0.3 09/15/2019 1120       RADIOGRAPHIC STUDIES: CT Chest W Contrast  Result Date: 09/13/2019 CLINICAL DATA:  64 year old male with history of non-small cell lung cancer. Staging examination. Chemotherapy in progress. EXAM: CT CHEST, ABDOMEN, AND PELVIS WITH CONTRAST TECHNIQUE: Multidetector CT imaging of the chest,  abdomen and pelvis was performed following the standard protocol during bolus administration of intravenous contrast. CONTRAST:  174mL OMNIPAQUE IOHEXOL 300 MG/ML  SOLN COMPARISON:  Chest CT 05/26/2019.  PET-CT 06/07/2019. FINDINGS: CT CHEST FINDINGS Cardiovascular: Heart size is normal. There is no significant pericardial fluid, thickening or pericardial calcification. There is aortic atherosclerosis, as well as atherosclerosis of the great vessels of the mediastinum and the coronary arteries, including calcified atherosclerotic plaque in the left main, left anterior descending, left circumflex and right coronary arteries. Right-sided internal jugular single-lumen porta cath with tip terminating in the superior aspect of the right atrium. Mediastinum/Nodes: Multiple prominent borderline enlarged and enlarged mediastinal lymph nodes, largest of which is in the subcarinal nodal station measuring up to 2.2 cm in short axis, decreased in size compared to the prior examination. No definite supraclavicular lymphadenopathy identified on today's examination. Esophagus is unremarkable in appearance. Previously noted axillary lymph nodes have decreased in size and are nonenlarged on today's examination. Lungs/Pleura: No suspicious appearing pulmonary nodules or masses are noted. No acute consolidative airspace disease. No pleural effusions. Mild linear scarring in the lateral segment of the right middle lobe, similar to the prior examination. Musculoskeletal: There are no aggressive appearing lytic or blastic lesions noted in the visualized portions of the skeleton. CT ABDOMEN PELVIS FINDINGS Hepatobiliary: There are 2 hypovascular hepatic lesions which correspond to areas of hypermetabolism noted on prior PET-CT, largest of which is in segment 4A of the liver (axial image 50 of series 2) measuring 2.0 x 1.9 cm. In addition, in segment 5 of the liver (axial image 66 of series 2) there is a 2.1 x 1.5 cm low-attenuation lesion  with some peripheral nodular hyperenhancement with progressive centripetal filling, diagnostic of a cavernous hemangioma. No intra or extrahepatic biliary ductal dilatation. Gallbladder is nearly decompressed, but otherwise unremarkable in appearance. Pancreas: No pancreatic mass. No pancreatic ductal dilatation. No pancreatic or peripancreatic fluid collections or inflammatory changes. Spleen: Unremarkable. Adrenals/Urinary Tract: Bilateral kidneys and adrenal glands are normal in appearance. No hydroureteronephrosis. Urinary bladder is normal in appearance. Stomach/Bowel: Normal appearance of the stomach. No pathologic dilatation of small bowel or colon. Normal appendix. Vascular/Lymphatic: Aortic atherosclerosis, without evidence of aneurysm or dissection in the abdominal or pelvic vasculature. Bilateral common iliac artery stents are noted. Extensive lymphadenopathy in the upper abdomen corresponding to hypermetabolic lymph nodes on recent PET-CT. These lymph nodes have significantly decreased in size, with the largest of these lymph nodes in the celiac axis nodal station currently measuring 2 cm in short axis (axial image 57 of series 2). Reproductive: Prostate gland and seminal vesicles are unremarkable in appearance. Other: No significant volume of ascites.  No pneumoperitoneum. Musculoskeletal: Orthopedic fixation hardware in the left femoral head and neck. There are no aggressive appearing lytic or blastic lesions noted in the visualized portions of the skeleton. IMPRESSION: 1. Today's study demonstrates positive response to therapy with regression of mediastinal, supraclavicular and axillary lymphadenopathy in the chest, and regression of 2 liver metastases and upper abdominal lymphadenopathy, as detailed above. 2. No discrete pulmonary lesion identified at this time. 3. Aortic atherosclerosis, in addition to left main and 3 vessel coronary artery disease. Please note that although the presence of coronary  artery calcium documents the presence of coronary artery disease, the severity of this disease and any potential stenosis cannot be assessed on this non-gated CT examination. Assessment for potential risk factor modification, dietary therapy or pharmacologic therapy may be warranted, if clinically indicated. 4. Additional incidental findings, as above. Electronically Signed   By: Vinnie Langton M.D.   On: 09/13/2019 11:06   CT Abdomen Pelvis W Contrast  Result Date: 09/13/2019 CLINICAL DATA:  64 year old male with history of non-small cell lung cancer. Staging examination. Chemotherapy in progress. EXAM: CT CHEST, ABDOMEN, AND PELVIS WITH CONTRAST TECHNIQUE: Multidetector CT imaging of the chest, abdomen and pelvis was performed following the standard protocol during bolus administration of intravenous contrast. CONTRAST:  131mL OMNIPAQUE IOHEXOL 300 MG/ML  SOLN COMPARISON:  Chest CT 05/26/2019.  PET-CT 06/07/2019. FINDINGS: CT CHEST FINDINGS Cardiovascular: Heart size is normal. There is no significant pericardial fluid, thickening or pericardial calcification. There is aortic atherosclerosis, as well as atherosclerosis of the great vessels of the mediastinum and the coronary arteries, including calcified atherosclerotic plaque in the left main, left anterior descending, left circumflex and right coronary arteries. Right-sided internal jugular single-lumen porta cath with tip terminating in the superior aspect of the right atrium. Mediastinum/Nodes: Multiple prominent borderline enlarged and enlarged mediastinal lymph nodes, largest of which is in the subcarinal nodal station measuring up to 2.2 cm in short axis, decreased in size compared to the prior examination. No definite supraclavicular lymphadenopathy identified on today's examination. Esophagus is unremarkable in appearance. Previously noted axillary lymph nodes have decreased in size and are nonenlarged on today's examination. Lungs/Pleura: No  suspicious appearing pulmonary nodules or masses are noted. No acute consolidative airspace disease. No pleural effusions. Mild linear scarring in the lateral segment of the right middle lobe, similar to the prior examination. Musculoskeletal: There are no aggressive appearing lytic or blastic lesions noted in the visualized portions of the skeleton. CT ABDOMEN PELVIS FINDINGS Hepatobiliary: There are 2 hypovascular hepatic lesions which correspond to areas of hypermetabolism noted on prior PET-CT, largest of which is in segment 4A of the liver (axial image 50 of series 2) measuring 2.0 x 1.9 cm. In addition, in segment 5 of the liver (axial image 66 of series 2) there is a 2.1 x 1.5 cm low-attenuation lesion with some peripheral nodular hyperenhancement with progressive centripetal filling, diagnostic of a cavernous hemangioma. No intra or extrahepatic biliary ductal dilatation. Gallbladder is nearly decompressed, but otherwise unremarkable in appearance. Pancreas: No pancreatic mass. No pancreatic ductal dilatation. No pancreatic or peripancreatic fluid collections or inflammatory changes. Spleen: Unremarkable. Adrenals/Urinary Tract: Bilateral kidneys and adrenal glands are normal in appearance. No hydroureteronephrosis. Urinary bladder is normal in appearance. Stomach/Bowel: Normal appearance of the stomach. No pathologic dilatation of small bowel or colon. Normal appendix. Vascular/Lymphatic: Aortic atherosclerosis, without evidence of aneurysm or dissection in the abdominal or pelvic vasculature. Bilateral common iliac artery stents are noted. Extensive lymphadenopathy in the upper abdomen corresponding  to hypermetabolic lymph nodes on recent PET-CT. These lymph nodes have significantly decreased in size, with the largest of these lymph nodes in the celiac axis nodal station currently measuring 2 cm in short axis (axial image 57 of series 2). Reproductive: Prostate gland and seminal vesicles are unremarkable  in appearance. Other: No significant volume of ascites.  No pneumoperitoneum. Musculoskeletal: Orthopedic fixation hardware in the left femoral head and neck. There are no aggressive appearing lytic or blastic lesions noted in the visualized portions of the skeleton. IMPRESSION: 1. Today's study demonstrates positive response to therapy with regression of mediastinal, supraclavicular and axillary lymphadenopathy in the chest, and regression of 2 liver metastases and upper abdominal lymphadenopathy, as detailed above. 2. No discrete pulmonary lesion identified at this time. 3. Aortic atherosclerosis, in addition to left main and 3 vessel coronary artery disease. Please note that although the presence of coronary artery calcium documents the presence of coronary artery disease, the severity of this disease and any potential stenosis cannot be assessed on this non-gated CT examination. Assessment for potential risk factor modification, dietary therapy or pharmacologic therapy may be warranted, if clinically indicated. 4. Additional incidental findings, as above. Electronically Signed   By: Vinnie Langton M.D.   On: 09/13/2019 11:06    ASSESSMENT AND PLAN: This is a very pleasant 64 years old African-American male recently diagnosed with a stage IV (TX, N3, M1c) non-small cell lung cancer, poorly differentiated adenocarcinoma presented with bulky mediastinal as well as right hilar, supraclavicular, abdominal and right axillary lymphadenopathy in addition to metastatic liver lesions diagnosed in January 2021. Molecular studies by guardant 360 shows no actionable mutations.  The patient is not a candidate for treatment with targeted therapy or enrollment in the clinical trial with the [facility] regimen. The patient is currently undergoing systemic chemotherapy with carboplatin for AUC of 5, Alimta 500 mg/M2 and Keytruda 200 mg IV every 3 weeks status post 3 cycles. The patient has been tolerating this treatment  well with no concerning adverse effects. He had repeat CT scan of the chest, abdomen pelvis performed recently.  I personally and independently reviewed the scans and discussed the results with the patient today. His scan showed improvement of his disease in the liver, mediastinal, axillary and supraclavicular nodes. I recommended for the patient to proceed with cycle #4 of his treatment today. He will come back for follow-up visit in 3 weeks for evaluation before starting cycle #5. For the pain management he will continue on Percocet on as-needed basis. For the constipation the patient will continue his current treatment with MiraLAX. He was advised to call immediately if he has any other concerning symptoms in the interval. The patient voices understanding of current disease status and treatment options and is in agreement with the current care plan.  All questions were answered. The patient knows to call the clinic with any problems, questions or concerns. We can certainly see the patient much sooner if necessary.  Disclaimer: This note was dictated with voice recognition software. Similar sounding words can inadvertently be transcribed and may not be corrected upon review.

## 2019-09-22 ENCOUNTER — Inpatient Hospital Stay: Payer: Medicare Other

## 2019-09-22 ENCOUNTER — Other Ambulatory Visit: Payer: Self-pay

## 2019-09-22 DIAGNOSIS — Z95828 Presence of other vascular implants and grafts: Secondary | ICD-10-CM

## 2019-09-22 DIAGNOSIS — C3490 Malignant neoplasm of unspecified part of unspecified bronchus or lung: Secondary | ICD-10-CM

## 2019-09-22 DIAGNOSIS — Z5111 Encounter for antineoplastic chemotherapy: Secondary | ICD-10-CM | POA: Diagnosis not present

## 2019-09-22 LAB — CBC WITH DIFFERENTIAL (CANCER CENTER ONLY)
Abs Immature Granulocytes: 0.01 10*3/uL (ref 0.00–0.07)
Basophils Absolute: 0 10*3/uL (ref 0.0–0.1)
Basophils Relative: 0 %
Eosinophils Absolute: 0 10*3/uL (ref 0.0–0.5)
Eosinophils Relative: 1 %
HCT: 28 % — ABNORMAL LOW (ref 39.0–52.0)
Hemoglobin: 9 g/dL — ABNORMAL LOW (ref 13.0–17.0)
Immature Granulocytes: 0 %
Lymphocytes Relative: 63 %
Lymphs Abs: 1.5 10*3/uL (ref 0.7–4.0)
MCH: 28.8 pg (ref 26.0–34.0)
MCHC: 32.1 g/dL (ref 30.0–36.0)
MCV: 89.5 fL (ref 80.0–100.0)
Monocytes Absolute: 0.2 10*3/uL (ref 0.1–1.0)
Monocytes Relative: 8 %
Neutro Abs: 0.7 10*3/uL — ABNORMAL LOW (ref 1.7–7.7)
Neutrophils Relative %: 28 %
Platelet Count: 181 10*3/uL (ref 150–400)
RBC: 3.13 MIL/uL — ABNORMAL LOW (ref 4.22–5.81)
RDW: 17.7 % — ABNORMAL HIGH (ref 11.5–15.5)
WBC Count: 2.4 10*3/uL — ABNORMAL LOW (ref 4.0–10.5)
nRBC: 0 % (ref 0.0–0.2)

## 2019-09-22 LAB — CMP (CANCER CENTER ONLY)
ALT: 19 U/L (ref 0–44)
AST: 20 U/L (ref 15–41)
Albumin: 3.9 g/dL (ref 3.5–5.0)
Alkaline Phosphatase: 58 U/L (ref 38–126)
Anion gap: 12 (ref 5–15)
BUN: 14 mg/dL (ref 8–23)
CO2: 25 mmol/L (ref 22–32)
Calcium: 9.1 mg/dL (ref 8.9–10.3)
Chloride: 105 mmol/L (ref 98–111)
Creatinine: 1.05 mg/dL (ref 0.61–1.24)
GFR, Est AFR Am: >60 mL/min (ref >60)
GFR, Estimated: >60 mL/min (ref >60)
Glucose, Bld: 161 mg/dL — ABNORMAL HIGH (ref 70–99)
Potassium: 3.6 mmol/L (ref 3.5–5.1)
Sodium: 142 mmol/L (ref 135–145)
Total Bilirubin: 0.4 mg/dL (ref 0.3–1.2)
Total Protein: 7.5 g/dL (ref 6.5–8.1)

## 2019-09-22 MED ORDER — SODIUM CHLORIDE 0.9% FLUSH
10.0000 mL | INTRAVENOUS | Status: DC | PRN
  Administered 2019-09-22: 09:00:00 10 mL via INTRAVENOUS
  Filled 2019-09-22: qty 10

## 2019-09-22 MED ORDER — HEPARIN SOD (PORK) LOCK FLUSH 100 UNIT/ML IV SOLN
500.0000 [IU] | Freq: Once | INTRAVENOUS | Status: AC
  Administered 2019-09-22: 500 [IU] via INTRAVENOUS
  Filled 2019-09-22: qty 5

## 2019-09-24 ENCOUNTER — Telehealth: Payer: Self-pay | Admitting: Medical Oncology

## 2019-09-24 NOTE — Telephone Encounter (Signed)
Real tired , no energy doing ADLS without problem. "I am not staying bed". Encouraged pt to stay hydrated and take frequent rest breaks as needed. Going on vacation June 16th.

## 2019-09-27 ENCOUNTER — Inpatient Hospital Stay: Payer: Medicare Other | Attending: Internal Medicine | Admitting: Medical

## 2019-09-27 ENCOUNTER — Other Ambulatory Visit: Payer: Self-pay

## 2019-09-27 ENCOUNTER — Inpatient Hospital Stay: Payer: Medicare Other

## 2019-09-27 ENCOUNTER — Telehealth: Payer: Self-pay | Admitting: Medical Oncology

## 2019-09-27 ENCOUNTER — Other Ambulatory Visit: Payer: Self-pay | Admitting: Emergency Medicine

## 2019-09-27 ENCOUNTER — Telehealth: Payer: Self-pay | Admitting: Family Medicine

## 2019-09-27 ENCOUNTER — Other Ambulatory Visit: Payer: Self-pay | Admitting: Family Medicine

## 2019-09-27 VITALS — BP 134/52 | HR 89 | Temp 98.5°F | Resp 17 | Ht 75.0 in | Wt 212.2 lb

## 2019-09-27 DIAGNOSIS — Z95828 Presence of other vascular implants and grafts: Secondary | ICD-10-CM | POA: Diagnosis not present

## 2019-09-27 DIAGNOSIS — E785 Hyperlipidemia, unspecified: Secondary | ICD-10-CM | POA: Insufficient documentation

## 2019-09-27 DIAGNOSIS — Z79899 Other long term (current) drug therapy: Secondary | ICD-10-CM | POA: Diagnosis not present

## 2019-09-27 DIAGNOSIS — Z7689 Persons encountering health services in other specified circumstances: Secondary | ICD-10-CM | POA: Insufficient documentation

## 2019-09-27 DIAGNOSIS — I10 Essential (primary) hypertension: Secondary | ICD-10-CM | POA: Diagnosis not present

## 2019-09-27 DIAGNOSIS — I252 Old myocardial infarction: Secondary | ICD-10-CM | POA: Diagnosis not present

## 2019-09-27 DIAGNOSIS — D702 Other drug-induced agranulocytosis: Secondary | ICD-10-CM

## 2019-09-27 DIAGNOSIS — B349 Viral infection, unspecified: Secondary | ICD-10-CM

## 2019-09-27 DIAGNOSIS — C787 Secondary malignant neoplasm of liver and intrahepatic bile duct: Secondary | ICD-10-CM

## 2019-09-27 DIAGNOSIS — C3490 Malignant neoplasm of unspecified part of unspecified bronchus or lung: Secondary | ICD-10-CM

## 2019-09-27 DIAGNOSIS — M1990 Unspecified osteoarthritis, unspecified site: Secondary | ICD-10-CM | POA: Diagnosis not present

## 2019-09-27 DIAGNOSIS — Z5112 Encounter for antineoplastic immunotherapy: Secondary | ICD-10-CM | POA: Insufficient documentation

## 2019-09-27 DIAGNOSIS — E1151 Type 2 diabetes mellitus with diabetic peripheral angiopathy without gangrene: Secondary | ICD-10-CM | POA: Insufficient documentation

## 2019-09-27 DIAGNOSIS — R5383 Other fatigue: Secondary | ICD-10-CM | POA: Diagnosis not present

## 2019-09-27 DIAGNOSIS — Z87891 Personal history of nicotine dependence: Secondary | ICD-10-CM | POA: Diagnosis not present

## 2019-09-27 DIAGNOSIS — C3491 Malignant neoplasm of unspecified part of right bronchus or lung: Secondary | ICD-10-CM | POA: Insufficient documentation

## 2019-09-27 DIAGNOSIS — Z5111 Encounter for antineoplastic chemotherapy: Secondary | ICD-10-CM | POA: Insufficient documentation

## 2019-09-27 LAB — CBC WITH DIFFERENTIAL (CANCER CENTER ONLY)
Abs Immature Granulocytes: 0 10*3/uL (ref 0.00–0.07)
Basophils Absolute: 0 10*3/uL (ref 0.0–0.1)
Basophils Relative: 0 %
Eosinophils Absolute: 0 10*3/uL (ref 0.0–0.5)
Eosinophils Relative: 0 %
HCT: 26.5 % — ABNORMAL LOW (ref 39.0–52.0)
Hemoglobin: 8.8 g/dL — ABNORMAL LOW (ref 13.0–17.0)
Immature Granulocytes: 0 %
Lymphocytes Relative: 69 %
Lymphs Abs: 1.9 10*3/uL (ref 0.7–4.0)
MCH: 29.7 pg (ref 26.0–34.0)
MCHC: 33.2 g/dL (ref 30.0–36.0)
MCV: 89.5 fL (ref 80.0–100.0)
Monocytes Absolute: 0.6 10*3/uL (ref 0.1–1.0)
Monocytes Relative: 21 %
Neutro Abs: 0.3 10*3/uL — CL (ref 1.7–7.7)
Neutrophils Relative %: 10 %
Platelet Count: 99 10*3/uL — ABNORMAL LOW (ref 150–400)
RBC: 2.96 MIL/uL — ABNORMAL LOW (ref 4.22–5.81)
RDW: 17.2 % — ABNORMAL HIGH (ref 11.5–15.5)
WBC Count: 2.8 10*3/uL — ABNORMAL LOW (ref 4.0–10.5)
nRBC: 0 % (ref 0.0–0.2)

## 2019-09-27 LAB — CMP (CANCER CENTER ONLY)
ALT: 20 U/L (ref 0–44)
AST: 20 U/L (ref 15–41)
Albumin: 3.8 g/dL (ref 3.5–5.0)
Alkaline Phosphatase: 64 U/L (ref 38–126)
Anion gap: 12 (ref 5–15)
BUN: 15 mg/dL (ref 8–23)
CO2: 25 mmol/L (ref 22–32)
Calcium: 9 mg/dL (ref 8.9–10.3)
Chloride: 104 mmol/L (ref 98–111)
Creatinine: 1.13 mg/dL (ref 0.61–1.24)
GFR, Est AFR Am: >60 mL/min (ref >60)
GFR, Estimated: >60 mL/min (ref >60)
Glucose, Bld: 157 mg/dL — ABNORMAL HIGH (ref 70–99)
Potassium: 3.8 mmol/L (ref 3.5–5.1)
Sodium: 141 mmol/L (ref 135–145)
Total Bilirubin: 0.3 mg/dL (ref 0.3–1.2)
Total Protein: 7.7 g/dL (ref 6.5–8.1)

## 2019-09-27 MED ORDER — SODIUM CHLORIDE 0.9% FLUSH
10.0000 mL | Freq: Once | INTRAVENOUS | Status: AC
  Administered 2019-09-27: 10 mL
  Filled 2019-09-27: qty 10

## 2019-09-27 MED ORDER — FILGRASTIM-SNDZ 480 MCG/0.8ML IJ SOSY
PREFILLED_SYRINGE | INTRAMUSCULAR | Status: AC
  Filled 2019-09-27: qty 0.8

## 2019-09-27 MED ORDER — HEPARIN SOD (PORK) LOCK FLUSH 100 UNIT/ML IV SOLN
500.0000 [IU] | Freq: Once | INTRAVENOUS | Status: AC
  Administered 2019-09-27: 15:00:00 500 [IU]
  Filled 2019-09-27: qty 5

## 2019-09-27 MED ORDER — AMOXICILLIN-POT CLAVULANATE 875-125 MG PO TABS: 1.0000 | ORAL_TABLET | Freq: Two times a day (BID) | ORAL | 0 refills | Status: DC

## 2019-09-27 MED ORDER — FILGRASTIM-SNDZ 480 MCG/0.8ML IJ SOSY
480.0000 ug | PREFILLED_SYRINGE | Freq: Once | INTRAMUSCULAR | Status: AC
  Administered 2019-09-27: 480 ug via SUBCUTANEOUS

## 2019-09-27 MED ORDER — FILGRASTIM-SNDZ 480 MCG/0.8ML IJ SOSY: 480.0000 ug | PREFILLED_SYRINGE | Freq: Once | INTRAMUSCULAR | Status: DC

## 2019-09-27 NOTE — Telephone Encounter (Signed)
Kirk [name] c/o nasal congestion runny nose, fatigue, gun soreness, eye sockets pain.    Patient wanted to know if you would send in an antibiotic or his symptoms?

## 2019-09-27 NOTE — Patient Instructions (Signed)

## 2019-09-27 NOTE — Telephone Encounter (Signed)
Called advised patient.

## 2019-09-27 NOTE — Telephone Encounter (Signed)
Kirk [name] called back he is going to the [facility] today

## 2019-09-27 NOTE — Telephone Encounter (Addendum)
He had a "rough weekend". Reports Sinus symptoms: watery eyes,facial pain under eyes and teeth hurt.  Denies fever, cough . Feels a little better  this am. Claritin not helpful.  Per Dr [name] -pt may go to [facility] .

## 2019-09-27 NOTE — Progress Notes (Signed)
[facility] Progress Note   ADHARV SOWINSKI [id number] July 13, 1955 64 y.o.  Jobe Gibbon is managed by Dr. Velora Heckler. Mohamed  Actively treated with chemotherapy/immunotherapy/hormonal therapy: yes  Current therapy: Systemic chemotherapy with carboplatin for AUC of 5, Alimta 500 mg/M2 and Keytruda 200 mg IV every 3 weeks.  First dose July 14, 2019.  Last treated: 09/15/2019 (cycle #4)  Next scheduled appointment with provider: 10/07/2019  Assessment: Plan:    Adenocarcinoma of right lung, stage 4 (HCC)  Liver metastasis (HCC)  Please see After Visit Summary for patient specific instructions.  Future Appointments  Date Time Provider Department Center  09/27/2019  2:00 PM [facility] LAB 2 [facility] None  09/27/2019  2:15 PM [facility] FLUSH [facility] None  09/27/2019  2:30 PM Muzamil Harker, Zenaida Niece E., PA-C [facility] None  09/29/2019  9:30 AM [facility]/FLUSH [facility] None  09/29/2019  9:45 AM [facility] FLUSH [facility] None  10/07/2019 10:45 AM [facility]/FLUSH [facility] None  10/07/2019 11:00 AM [facility] FLUSH [facility] None  10/07/2019 11:30 AM Si Gaul, MD [facility] None  10/07/2019 12:30 PM [facility] INFUSION [facility] None  10/13/2019  9:30 AM [facility]/FLUSH [facility] None  10/13/2019  9:45 AM [facility] FLUSH [facility] None  10/20/2019  9:30 AM [facility]/FLUSH [facility] None  10/20/2019  9:45 AM [facility] FLUSH [facility] None  10/27/2019 11:15 AM [facility] LAB 4 [facility] None  10/27/2019 11:30 AM [facility] FLUSH [facility] None  10/27/2019 12:00 PM Si Gaul, MD [facility] None  10/27/2019  1:00 PM [facility] INFUSION [facility] None  11/10/2019  9:40 AM Kallie Locks, FNP [facility] None    No orders of the defined types were placed in this encounter.      Subjective:   Patient ID:  DWIGHT CARRELL is a 64 y.o. (DOB [birth year]) male.  Chief Complaint: No chief complaint on file.   HPI BLADE OKULA  is a 64 y.o. male with a diagnosis of a stage IV (TX, N3, M1c) non-small cell lung cancer, poorly differentiated adenocarcinoma presented with bulky mediastinal lymphadenopathy in addition to right axillary lymphadenopathy and metastatic liver lesions, abdominal lymphadenopathy on his diagnosis in January 2021. He is managed by Dr. Arbutus Ped and is status post cycle #4 of systemic chemotherapy with carboplatin for AUC of 5, Alimta 500 mg/M2 and Keytruda 200 mg IV every 3 weeks.  He received cycle # 4 of chemotherapy on 09/15/2019. He presents to the clinic today with nasal congestion, runny nose, fatigue, watery eyes, facial pain under eyes, teeth pain, gum soreness, and eye sockets pain.  He denies fever, cough . He is feeling a little better  this Morning. He has been taking Claritin without much benefit..  Medications: I have reviewed the patient's current medications.  Allergies: No Known Allergies  Past Medical History:  Diagnosis Date  . Arthritis    "right knee" (09/04/2015)  . Atrial flutter with rapid ventricular response (HCC) 04/19/2019  . Dyspnea    increased exertion  . HTN (hypertension)   . Hyperlipidemia   . Lung cancer (HCC) 05/2019  . NSTEMI (non-ST elevated myocardial infarction) (HCC) 09/04/2015   a. cath 09/05/2015: 95% stenosis mid-LAD, 55% mid-RCA, and 40% prox-RCA. PCI performed w/ Synergy DES to LAD  . PAD (peripheral artery disease) (HCC)    Stenting of bilateral iliacs in 2003.  [name] Refusal of blood transfusions as patient is Jehovah's Witness   . Type II diabetes mellitus (HCC)     Past Surgical History:  Procedure Laterality Date  . CARDIAC CATHETERIZATION N/A 09/05/2015   Procedure: Left Heart Cath and Coronary Angiography;  Surgeon: Lennette Bihari, MD;  Location: [facility] INVASIVE CV LAB;  Service: Cardiovascular;  Laterality: N/A;  . CARDIAC CATHETERIZATION N/A 09/05/2015   Procedure: Coronary Stent Intervention;  Surgeon: Lennette Bihari, MD;  Location: [facility]  INVASIVE CV LAB;  Service: Cardiovascular;  Laterality: N/A;  . IR IMAGING GUIDED PORT INSERTION  07/15/2019  . 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 PERIPHERAL VASCULAR CATHETERIZATION Bilateral 2003   "stenting"  . TONSILLECTOMY  ~ 1963    Family History  Problem Relation Age of Onset  . Lymphoma Mother   . Cancer - Prostate Father   . CAD Neg Hx   . Colon cancer Neg Hx   . Rectal cancer Neg Hx   . Stomach cancer Neg Hx   . Esophageal cancer Neg Hx     Social History   Socioeconomic History  . Marital status: Married    Spouse name: Not on file  . Number of children: 1  . Years of education: Not on file  . Highest education level: Not on file  Occupational History  . Occupation: Management consultant  Tobacco Use  . Smoking status: Former Smoker    Packs/day: 2.00    Years: 35.00    Pack years: 70.00    Types: Cigarettes    Quit date: 02/19/2003    Years since quitting: 16.6  . Smokeless tobacco: Never Used  Substance and Sexual Activity  . Alcohol use: Not Currently    Alcohol/week: 0.0 standard drinks    Comment: 09/04/2015 "nothing since 1980s"  . 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 Sexual activity: Not Currently  Other Topics Concern  . Not on file  Social History Narrative  . Not on file   Social Determinants of Health   Financial Resource Strain:   . Difficulty of Paying Living Expenses:   Food Insecurity:   . Worried About Programme researcher, broadcasting/film/video in the Last Year:   . Barista in the Last Year:   Transportation Needs:   . Freight forwarder (Medical):   [name] Lack of Transportation (Non-Medical):   Physical Activity:   . Days of Exercise per Week:   . Minutes of Exercise per Session:   Stress:   . Feeling of Stress :   Social Connections:   . Frequency of Communication with Friends and Family:   . Frequency of Social Gatherings with Friends and Family:   . Attends Religious  Services:   . Active Member of Clubs or Organizations:   . Attends Banker Meetings:   [name] Marital Status:   Intimate Partner Violence:   . Fear of Current or Ex-Partner:   . Emotionally Abused:   [name] Physically Abused:   . Sexually Abused:     Past Medical History, Surgical history, Social history, and Family history were reviewed and updated as appropriate.   Please see review of systems for further details on the patient's review from today.   Review of Systems:  Review of Systems  Constitutional: Positive for fatigue. Negative for chills, diaphoresis and fever.  HENT: Positive for congestion, postnasal drip, sinus pressure and sinus pain. Negative for trouble swallowing and voice change.   Respiratory: Negative for cough, chest tightness, shortness of breath and wheezing.  Cardiovascular: Negative for chest pain and palpitations.  Gastrointestinal: Negative for abdominal pain, constipation, diarrhea, nausea and vomiting.  Musculoskeletal: Negative for back pain and myalgias.  Neurological: Negative for dizziness, light-headedness and headaches.    Objective:   Physical Exam:  There were no vitals taken for this visit. ECOG: 0  Physical Exam Constitutional:      General: He is not in acute distress.    Appearance: He is not diaphoretic.  HENT:     Head: Normocephalic and atraumatic.     Nose:     Right Sinus: Maxillary sinus tenderness present. No frontal sinus tenderness.     Left Sinus: Maxillary sinus tenderness present. No frontal sinus tenderness.     Mouth/Throat:     Pharynx: No oropharyngeal exudate.  Cardiovascular:     Rate and Rhythm: Normal rate and regular rhythm.     Heart sounds: Normal heart sounds. No murmur. No friction rub. No gallop.   Pulmonary:     Effort: Pulmonary effort is normal. No respiratory distress.     Breath sounds: Normal breath sounds. No wheezing or rales.  Skin:    General: Skin is warm and dry.     Findings: No  erythema or rash.  Neurological:     Mental Status: He is alert.     Lab Review:     Component Value Date/Time   NA 142 09/22/2019 0906   NA 140 06/08/2015 0840   K 3.6 09/22/2019 0906   CL 105 09/22/2019 0906   CO2 25 09/22/2019 0906   GLUCOSE 161 (H) 09/22/2019 0906   BUN 14 09/22/2019 0906   BUN 15 06/08/2015 0840   CREATININE 1.05 09/22/2019 0906   CREATININE 1.06 10/01/2016 0855   CALCIUM 9.1 09/22/2019 0906   PROT 7.5 09/22/2019 0906   PROT 7.5 07/22/2019 0824   ALBUMIN 3.9 09/22/2019 0906   ALBUMIN 4.4 07/22/2019 0824   AST 20 09/22/2019 0906   ALT 19 09/22/2019 0906   ALKPHOS 58 09/22/2019 0906   BILITOT 0.4 09/22/2019 0906   GFRNONAA >60 09/22/2019 0906   GFRNONAA 75 10/01/2016 0855   GFRAA >60 09/22/2019 0906   GFRAA 87 10/01/2016 0855       Component Value Date/Time   WBC 2.4 (L) 09/22/2019 0906   WBC 8.1 06/24/2019 1130   RBC 3.13 (L) 09/22/2019 0906   HGB 9.0 (L) 09/22/2019 0906   HCT 28.0 (L) 09/22/2019 0906   PLT 181 09/22/2019 0906   MCV 89.5 09/22/2019 0906   MCH 28.8 09/22/2019 0906   MCHC 32.1 09/22/2019 0906   RDW 17.7 (H) 09/22/2019 0906   LYMPHSABS 1.5 09/22/2019 0906   MONOABS 0.2 09/22/2019 0906   EOSABS 0.0 09/22/2019 0906   BASOSABS 0.0 09/22/2019 0906   -------------------------------  Imaging from last 24 hours (if applicable):  Radiology interpretation: CT Chest W Contrast  Result Date: 09/13/2019 CLINICAL DATA:  64 year old male with history of non-small cell lung cancer. Staging examination. Chemotherapy in progress. EXAM: CT CHEST, ABDOMEN, AND PELVIS WITH CONTRAST TECHNIQUE: Multidetector CT imaging of the chest, abdomen and pelvis was performed following the standard protocol during bolus administration of intravenous contrast. CONTRAST:  OMNIPAQUE IOHEXOL 300 MG/ML  SOLN COMPARISON:  Chest CT 05/26/2019.  PET-CT 06/07/2019. FINDINGS: CT CHEST FINDINGS Cardiovascular: Heart size is normal. There is no significant  pericardial fluid, thickening or pericardial calcification. There is aortic atherosclerosis, as well as atherosclerosis of the great vessels of the mediastinum and the coronary arteries, including calcified  atherosclerotic plaque in the left main, left anterior descending, left circumflex and right coronary arteries. Right-sided internal jugular single-lumen porta cath with tip terminating in the superior aspect of the right atrium. Mediastinum/Nodes: Multiple prominent borderline enlarged and enlarged mediastinal lymph nodes, largest of which is in the subcarinal nodal station measuring up to 2.2 cm in short axis, decreased in size compared to the prior examination. No definite supraclavicular lymphadenopathy identified on today's examination. Esophagus is unremarkable in appearance. Previously noted axillary lymph nodes have decreased in size and are nonenlarged on today's examination. Lungs/Pleura: No suspicious appearing pulmonary nodules or masses are noted. No acute consolidative airspace disease. No pleural effusions. Mild linear scarring in the lateral segment of the right middle lobe, similar to the prior examination. Musculoskeletal: There are no aggressive appearing lytic or blastic lesions noted in the visualized portions of the skeleton. CT ABDOMEN PELVIS FINDINGS Hepatobiliary: There are 2 hypovascular hepatic lesions which correspond to areas of hypermetabolism noted on prior PET-CT, largest of which is in segment 4A of the liver (axial image 50 of series 2) measuring 2.0 x 1.9 cm. In addition, in segment 5 of the liver (axial image 66 of series 2) there is a 2.1 x 1.5 cm low-attenuation lesion with some peripheral nodular hyperenhancement with progressive centripetal filling, diagnostic of a cavernous hemangioma. No intra or extrahepatic biliary ductal dilatation. Gallbladder is nearly decompressed, but otherwise unremarkable in appearance. Pancreas: No pancreatic mass. No pancreatic ductal  dilatation. No pancreatic or peripancreatic fluid collections or inflammatory changes. Spleen: Unremarkable. Adrenals/Urinary Tract: Bilateral kidneys and adrenal glands are normal in appearance. No hydroureteronephrosis. Urinary bladder is normal in appearance. Stomach/Bowel: Normal appearance of the stomach. No pathologic dilatation of small bowel or colon. Normal appendix. Vascular/Lymphatic: Aortic atherosclerosis, without evidence of aneurysm or dissection in the abdominal or pelvic vasculature. Bilateral common iliac artery stents are noted. Extensive lymphadenopathy in the upper abdomen corresponding to hypermetabolic lymph nodes on recent PET-CT. These lymph nodes have significantly decreased in size, with the largest of these lymph nodes in the celiac axis nodal station currently measuring 2 cm in short axis (axial image 57 of series 2). Reproductive: Prostate gland and seminal vesicles are unremarkable in appearance. Other: No significant volume of ascites.  No pneumoperitoneum. Musculoskeletal: Orthopedic fixation hardware in the left femoral head and neck. There are no aggressive appearing lytic or blastic lesions noted in the visualized portions of the skeleton. IMPRESSION: 1. Today's study demonstrates positive response to therapy with regression of mediastinal, supraclavicular and axillary lymphadenopathy in the chest, and regression of 2 liver metastases and upper abdominal lymphadenopathy, as detailed above. 2. No discrete pulmonary lesion identified at this time. 3. Aortic atherosclerosis, in addition to left main and 3 vessel coronary artery disease. Please note that although the presence of coronary artery calcium documents the presence of coronary artery disease, the severity of this disease and any potential stenosis cannot be assessed on this non-gated CT examination. Assessment for potential risk factor modification, dietary therapy or pharmacologic therapy may be warranted, if clinically  indicated. 4. Additional incidental findings, as above. Electronically Signed   By: Trudie Reed M.D.   On: 09/13/2019 11:06   CT Abdomen Pelvis W Contrast  Result Date: 09/13/2019 CLINICAL DATA:  64 year old male with history of non-small cell lung cancer. Staging examination. Chemotherapy in progress. EXAM: CT CHEST, ABDOMEN, AND PELVIS WITH CONTRAST TECHNIQUE: Multidetector CT imaging of the chest, abdomen and pelvis was performed following the standard protocol during bolus administration of intravenous  contrast. CONTRAST:  OMNIPAQUE IOHEXOL 300 MG/ML  SOLN COMPARISON:  Chest CT 05/26/2019.  PET-CT 06/07/2019. FINDINGS: CT CHEST FINDINGS Cardiovascular: Heart size is normal. There is no significant pericardial fluid, thickening or pericardial calcification. There is aortic atherosclerosis, as well as atherosclerosis of the great vessels of the mediastinum and the coronary arteries, including calcified atherosclerotic plaque in the left main, left anterior descending, left circumflex and right coronary arteries. Right-sided internal jugular single-lumen porta cath with tip terminating in the superior aspect of the right atrium. Mediastinum/Nodes: Multiple prominent borderline enlarged and enlarged mediastinal lymph nodes, largest of which is in the subcarinal nodal station measuring up to 2.2 cm in short axis, decreased in size compared to the prior examination. No definite supraclavicular lymphadenopathy identified on today's examination. Esophagus is unremarkable in appearance. Previously noted axillary lymph nodes have decreased in size and are nonenlarged on today's examination. Lungs/Pleura: No suspicious appearing pulmonary nodules or masses are noted. No acute consolidative airspace disease. No pleural effusions. Mild linear scarring in the lateral segment of the right middle lobe, similar to the prior examination. Musculoskeletal: There are no aggressive appearing lytic or blastic lesions  noted in the visualized portions of the skeleton. CT ABDOMEN PELVIS FINDINGS Hepatobiliary: There are 2 hypovascular hepatic lesions which correspond to areas of hypermetabolism noted on prior PET-CT, largest of which is in segment 4A of the liver (axial image 50 of series 2) measuring 2.0 x 1.9 cm. In addition, in segment 5 of the liver (axial image 66 of series 2) there is a 2.1 x 1.5 cm low-attenuation lesion with some peripheral nodular hyperenhancement with progressive centripetal filling, diagnostic of a cavernous hemangioma. No intra or extrahepatic biliary ductal dilatation. Gallbladder is nearly decompressed, but otherwise unremarkable in appearance. Pancreas: No pancreatic mass. No pancreatic ductal dilatation. No pancreatic or peripancreatic fluid collections or inflammatory changes. Spleen: Unremarkable. Adrenals/Urinary Tract: Bilateral kidneys and adrenal glands are normal in appearance. No hydroureteronephrosis. Urinary bladder is normal in appearance. Stomach/Bowel: Normal appearance of the stomach. No pathologic dilatation of small bowel or colon. Normal appendix. Vascular/Lymphatic: Aortic atherosclerosis, without evidence of aneurysm or dissection in the abdominal or pelvic vasculature. Bilateral common iliac artery stents are noted. Extensive lymphadenopathy in the upper abdomen corresponding to hypermetabolic lymph nodes on recent PET-CT. These lymph nodes have significantly decreased in size, with the largest of these lymph nodes in the celiac axis nodal station currently measuring 2 cm in short axis (axial image 57 of series 2). Reproductive: Prostate gland and seminal vesicles are unremarkable in appearance. Other: No significant volume of ascites.  No pneumoperitoneum. Musculoskeletal: Orthopedic fixation hardware in the left femoral head and neck. There are no aggressive appearing lytic or blastic lesions noted in the visualized portions of the skeleton. IMPRESSION: 1. Today's study  demonstrates positive response to therapy with regression of mediastinal, supraclavicular and axillary lymphadenopathy in the chest, and regression of 2 liver metastases and upper abdominal lymphadenopathy, as detailed above. 2. No discrete pulmonary lesion identified at this time. 3. Aortic atherosclerosis, in addition to left main and 3 vessel coronary artery disease. Please note that although the presence of coronary artery calcium documents the presence of coronary artery disease, the severity of this disease and any potential stenosis cannot be assessed on this non-gated CT examination. Assessment for potential risk factor modification, dietary therapy or pharmacologic therapy may be warranted, if clinically indicated. 4. Additional incidental findings, as above. Electronically Signed   By: Trudie Reed M.D.   On: 09/13/2019  11:06        This case was discussed with Dr. Arbutus Ped. He expressed agreement with my management of this patient.

## 2019-09-27 NOTE — Progress Notes (Signed)
Critical lab received at 1455: ANC 0.3 PA Lucianne Lei aware.

## 2019-09-27 NOTE — Telephone Encounter (Signed)
Pt experiencing congestion. Pt wants to know if something can be sent in or if he needs an appointment. Please call pt back.

## 2019-09-28 ENCOUNTER — Inpatient Hospital Stay: Payer: Medicare Other

## 2019-09-28 ENCOUNTER — Other Ambulatory Visit: Payer: Self-pay

## 2019-09-28 DIAGNOSIS — C3491 Malignant neoplasm of unspecified part of right bronchus or lung: Secondary | ICD-10-CM

## 2019-09-28 DIAGNOSIS — Z5111 Encounter for antineoplastic chemotherapy: Secondary | ICD-10-CM | POA: Diagnosis not present

## 2019-09-28 MED ORDER — FILGRASTIM-SNDZ 480 MCG/0.8ML IJ SOSY
PREFILLED_SYRINGE | INTRAMUSCULAR | Status: AC
  Filled 2019-09-28: qty 0.8

## 2019-09-28 MED ORDER — FILGRASTIM-SNDZ 480 MCG/0.8ML IJ SOSY
480.0000 ug | PREFILLED_SYRINGE | Freq: Once | INTRAMUSCULAR | Status: AC
  Administered 2019-09-28: 480 ug via SUBCUTANEOUS

## 2019-09-28 NOTE — Patient Instructions (Signed)

## 2019-09-29 ENCOUNTER — Other Ambulatory Visit: Payer: Medicare Other

## 2019-09-29 ENCOUNTER — Other Ambulatory Visit: Payer: Self-pay | Admitting: Internal Medicine

## 2019-10-01 NOTE — Progress Notes (Signed)
Pharmacist Chemotherapy Monitoring - Follow Up Assessment    I verify that I have reviewed each item in the below checklist:  . Regimen for the patient is scheduled for the appropriate day and plan matches scheduled date. [name] Appropriate non-routine labs are ordered dependent on drug ordered. . If applicable, additional medications reviewed and ordered per protocol based on lifetime cumulative doses and/or treatment regimen.   Plan for follow-up and/or issues identified: No . I-vent associated with next due treatment: No . MD and/or nursing notified: No  Britt Boozer 10/01/2019 1:51 PM

## 2019-10-03 ENCOUNTER — Other Ambulatory Visit: Payer: Self-pay | Admitting: Family Medicine

## 2019-10-03 DIAGNOSIS — J302 Other seasonal allergic rhinitis: Secondary | ICD-10-CM

## 2019-10-07 ENCOUNTER — Inpatient Hospital Stay: Payer: Medicare Other

## 2019-10-07 ENCOUNTER — Encounter: Payer: Self-pay | Admitting: Internal Medicine

## 2019-10-07 ENCOUNTER — Inpatient Hospital Stay (HOSPITAL_BASED_OUTPATIENT_CLINIC_OR_DEPARTMENT_OTHER): Payer: Medicare Other | Admitting: Internal Medicine

## 2019-10-07 ENCOUNTER — Other Ambulatory Visit: Payer: Self-pay

## 2019-10-07 VITALS — BP 133/52 | HR 83 | Temp 98.0°F | Resp 17 | Ht 75.0 in | Wt 212.7 lb

## 2019-10-07 DIAGNOSIS — C3490 Malignant neoplasm of unspecified part of unspecified bronchus or lung: Secondary | ICD-10-CM

## 2019-10-07 DIAGNOSIS — C787 Secondary malignant neoplasm of liver and intrahepatic bile duct: Secondary | ICD-10-CM | POA: Diagnosis not present

## 2019-10-07 DIAGNOSIS — I10 Essential (primary) hypertension: Secondary | ICD-10-CM | POA: Diagnosis not present

## 2019-10-07 DIAGNOSIS — Z5111 Encounter for antineoplastic chemotherapy: Secondary | ICD-10-CM

## 2019-10-07 DIAGNOSIS — I2510 Atherosclerotic heart disease of native coronary artery without angina pectoris: Secondary | ICD-10-CM | POA: Diagnosis not present

## 2019-10-07 DIAGNOSIS — Z95828 Presence of other vascular implants and grafts: Secondary | ICD-10-CM

## 2019-10-07 DIAGNOSIS — Z5112 Encounter for antineoplastic immunotherapy: Secondary | ICD-10-CM

## 2019-10-07 DIAGNOSIS — C3491 Malignant neoplasm of unspecified part of right bronchus or lung: Secondary | ICD-10-CM | POA: Diagnosis not present

## 2019-10-07 DIAGNOSIS — R5382 Chronic fatigue, unspecified: Secondary | ICD-10-CM

## 2019-10-07 LAB — CMP (CANCER CENTER ONLY)
ALT: 25 U/L (ref 0–44)
AST: 28 U/L (ref 15–41)
Albumin: 3.8 g/dL (ref 3.5–5.0)
Alkaline Phosphatase: 65 U/L (ref 38–126)
Anion gap: 13 (ref 5–15)
BUN: 10 mg/dL (ref 8–23)
CO2: 23 mmol/L (ref 22–32)
Calcium: 9.1 mg/dL (ref 8.9–10.3)
Chloride: 105 mmol/L (ref 98–111)
Creatinine: 1.26 mg/dL — ABNORMAL HIGH (ref 0.61–1.24)
GFR, Est AFR Am: >60 mL/min (ref >60)
GFR, Estimated: 59 mL/min — ABNORMAL LOW (ref >60)
Glucose, Bld: 235 mg/dL — ABNORMAL HIGH (ref 70–99)
Potassium: 3.7 mmol/L (ref 3.5–5.1)
Sodium: 141 mmol/L (ref 135–145)
Total Bilirubin: 0.2 mg/dL — ABNORMAL LOW (ref 0.3–1.2)
Total Protein: 7.5 g/dL (ref 6.5–8.1)

## 2019-10-07 LAB — CBC WITH DIFFERENTIAL (CANCER CENTER ONLY)
Abs Immature Granulocytes: 0.03 10*3/uL (ref 0.00–0.07)
Basophils Absolute: 0 10*3/uL (ref 0.0–0.1)
Basophils Relative: 1 %
Eosinophils Absolute: 0.1 10*3/uL (ref 0.0–0.5)
Eosinophils Relative: 1 %
HCT: 28.4 % — ABNORMAL LOW (ref 39.0–52.0)
Hemoglobin: 9.1 g/dL — ABNORMAL LOW (ref 13.0–17.0)
Immature Granulocytes: 1 %
Lymphocytes Relative: 41 %
Lymphs Abs: 1.9 10*3/uL (ref 0.7–4.0)
MCH: 30.1 pg (ref 26.0–34.0)
MCHC: 32 g/dL (ref 30.0–36.0)
MCV: 94 fL (ref 80.0–100.0)
Monocytes Absolute: 0.6 10*3/uL (ref 0.1–1.0)
Monocytes Relative: 12 %
Neutro Abs: 2.1 10*3/uL (ref 1.7–7.7)
Neutrophils Relative %: 44 %
Platelet Count: 254 10*3/uL (ref 150–400)
RBC: 3.02 MIL/uL — ABNORMAL LOW (ref 4.22–5.81)
RDW: 19.2 % — ABNORMAL HIGH (ref 11.5–15.5)
WBC Count: 4.7 10*3/uL (ref 4.0–10.5)
nRBC: 0 % (ref 0.0–0.2)

## 2019-10-07 LAB — TSH: TSH: 0.511 u[IU]/mL (ref 0.320–4.118)

## 2019-10-07 MED ORDER — HEPARIN SOD (PORK) LOCK FLUSH 100 UNIT/ML IV SOLN
500.0000 [IU] | Freq: Once | INTRAVENOUS | Status: AC | PRN
  Administered 2019-10-07: 500 [IU]
  Filled 2019-10-07: qty 5

## 2019-10-07 MED ORDER — PROCHLORPERAZINE MALEATE 10 MG PO TABS
10.0000 mg | ORAL_TABLET | Freq: Once | ORAL | Status: AC
  Administered 2019-10-07: 10 mg via ORAL

## 2019-10-07 MED ORDER — SODIUM CHLORIDE 0.9 % IV SOLN
Freq: Once | INTRAVENOUS | Status: AC
  Filled 2019-10-07: qty 250

## 2019-10-07 MED ORDER — SODIUM CHLORIDE 0.9% FLUSH
10.0000 mL | INTRAVENOUS | Status: DC | PRN
  Administered 2019-10-07: 10 mL via INTRAVENOUS
  Filled 2019-10-07: qty 10

## 2019-10-07 MED ORDER — PROCHLORPERAZINE MALEATE 10 MG PO TABS
ORAL_TABLET | ORAL | Status: AC
  Filled 2019-10-07: qty 1

## 2019-10-07 MED ORDER — SODIUM CHLORIDE 0.9 % IV SOLN
200.0000 mg | Freq: Once | INTRAVENOUS | Status: AC
  Administered 2019-10-07: 200 mg via INTRAVENOUS
  Filled 2019-10-07: qty 8

## 2019-10-07 MED ORDER — SODIUM CHLORIDE 0.9% FLUSH
10.0000 mL | INTRAVENOUS | Status: DC | PRN
  Administered 2019-10-07: 10 mL
  Filled 2019-10-07: qty 10

## 2019-10-07 MED ORDER — SODIUM CHLORIDE 0.9 % IV SOLN
500.0000 mg/m2 | Freq: Once | INTRAVENOUS | Status: AC
  Administered 2019-10-07: 1100 mg via INTRAVENOUS
  Filled 2019-10-07: qty 4

## 2019-10-07 NOTE — Patient Instructions (Signed)
[facility] Discharge Instructions for Patients Receiving Chemotherapy  Today you received the following chemotherapy agents: Keytruda and Alimta  To help prevent nausea and vomiting after your treatment, we encourage you to take your nausea medication as prescribed.    If you develop nausea and vomiting that is not controlled by your nausea medication, call the clinic.   BELOW ARE SYMPTOMS THAT SHOULD BE REPORTED IMMEDIATELY:  *FEVER GREATER THAN 100.5 F  *CHILLS WITH OR WITHOUT FEVER  NAUSEA AND VOMITING THAT IS NOT CONTROLLED WITH YOUR NAUSEA MEDICATION  *UNUSUAL SHORTNESS OF BREATH  *UNUSUAL BRUISING OR BLEEDING  TENDERNESS IN MOUTH AND THROAT WITH OR WITHOUT PRESENCE OF ULCERS  *URINARY PROBLEMS  *BOWEL PROBLEMS  UNUSUAL RASH Items with * indicate a potential emergency and should be followed up as soon as possible.  Feel free to call the clinic should you have any questions or concerns. The clinic phone number is [phone].  Please show the [facility] at check-in to the Emergency Department and triage nurse.

## 2019-10-07 NOTE — Progress Notes (Signed)
[place] [phone]   [phone]) [phone]  OFFICE PROGRESS NOTE  [name], FNP [name] [address]  DIAGNOSIS: Stage IV (TX, N3, M1c) non-small cell lung cancer, poorly differentiated adenocarcinoma presented with bulky mediastinal lymphadenopathy in addition to right axillary lymphadenopathy and metastatic liver lesions, abdominal lymphadenopathy diagnosed in January 2021. Molecular studies by [employer] that shows no actionable mutations.  PRIOR THERAPY: None  CURRENT THERAPY: Systemic chemotherapy with carboplatin for AUC of 5, Alimta 500 mg/M2 and Keytruda 200 mg IV every 3 weeks.  First dose July 14, 2019.  Status post 4 cycles. Starting from cycle #5 the patient will be treated with maintenance Alimta and Keytruda every 3 weeks.  INTERVAL HISTORY: Kirk [name] 64 y.o. male returns to the clinic today for follow-up visit. The patient is feeling fine today with no concerning complaints except for mild fatigue. He denied having any current chest pain, shortness of breath, cough or hemoptysis. He denied having any fever or chills. He has no nausea, vomiting, diarrhea or constipation. He has no headache or visual changes. He tolerated the last cycle of his treatment fairly well. Is here today for evaluation before starting cycle #5.  MEDICAL HISTORY: Past Medical History:  Diagnosis Date  . Arthritis    "right knee" (09/04/2015)  . Atrial flutter with rapid ventricular response ([facility]) 04/19/2019  . Dyspnea    increased exertion  . HTN (hypertension)   . Hyperlipidemia   . Lung cancer ([facility]) 05/2019  . NSTEMI (non-ST elevated myocardial infarction) ([facility]) 09/04/2015   a. cath 09/05/2015: 95% stenosis mid-LAD, 55% mid-RCA, and 40% prox-RCA. PCI performed w/ Synergy DES to LAD  . PAD (peripheral artery disease) ([facility])    Stenting of bilateral iliacs in 2003.  [name] Refusal of blood transfusions as patient is Jehovah's  Witness   . Type II diabetes mellitus ([facility])     ALLERGIES:  has No Known Allergies.  MEDICATIONS:  Current Outpatient Medications  Medication Sig Dispense Refill  . acetaminophen (TYLENOL) 500 MG tablet Take 500 mg by mouth every 6 (six) hours as needed for fever.    [name] amoxicillin-clavulanate (AUGMENTIN) 875-125 MG tablet Take 1 tablet by mouth 2 (two) times daily for 10 days. 20 tablet 0  . amoxicillin-clavulanate (AUGMENTIN) 875-125 MG tablet Take 1 tablet by mouth 2 (two) times daily. 14 tablet 0  . aspirin EC 81 MG tablet Take 81 mg by mouth at bedtime.    . carvedilol (COREG) 12.5 MG tablet Take 1 tablet (12.5 mg total) by mouth 2 (two) times daily with a meal. 180 tablet 3  . cetirizine (ZYRTEC) 10 MG tablet TAKE 1 TABLET BY MOUTH DAILY 30 tablet 0  . clopidogrel (PLAVIX) 75 MG tablet Take 1 tablet (75 mg total) by mouth daily. 90 tablet 3  . folic acid (FOLVITE) 1 MG tablet TAKE 1 TABLET BY MOUTH EVERY DAY 90 tablet 1  . glimepiride (AMARYL) 4 MG tablet TAKE 1 TABLET BY MOUTH EVERY DAY 90 tablet 1  . HYDROcodone-homatropine (HYCODAN) 5-1.5 MG/5ML syrup Take 5 mLs by mouth every 8 (eight) hours as needed for cough. (Patient not taking: Reported on 08/09/2019) 120 mL 0  . Hydrocortisone Acetate 1 % CREA Apply 15 g topically 2 (two) times daily. 15 g 3  . ibuprofen (ADVIL) 200 MG tablet Take 600 mg by mouth once.    [name] ipratropium (ATROVENT) 0.03 % nasal spray USE 2 SPRAYS IN [facility]  NOSTRIL TWICE DAILY 30 mL 2  . lactulose (CHRONULAC) 10 GM/15ML solution Take 30 mLs (20 g total) by mouth 2 (two) times daily as needed for moderate constipation. 240 mL 3  . lidocaine-prilocaine (EMLA) cream APPLY TO THE PORT-A-CATH SITE 30-60 MINUTES BEFORE TREATMENT. 30 g 0  . lisinopril (ZESTRIL) 10 MG tablet TAKE 1 TABLET BY MOUTH EVERY DAY 90 tablet 1  . metFORMIN (GLUCOPHAGE) 1000 MG tablet TAKE 1 TABLET (1,000 MG TOTAL) BY MOUTH 2 (TWO) TIMES DAILY WITH A MEAL. 180 tablet 1  . multivitamin (ONE-A-DAY  MEN'S) TABS tablet Take 1 tablet by mouth daily. 30 tablet 6  . nitroGLYCERIN (NITROSTAT) 0.4 MG SL tablet Place 1 tablet (0.4 mg total) under the tongue every 5 (five) minutes x 3 doses as needed for chest pain. (Patient not taking: Reported on 08/09/2019) 25 tablet 2  . oxyCODONE-acetaminophen (PERCOCET/ROXICET) 5-325 MG tablet Take 1 tablet by mouth every 6 (six) hours as needed for severe pain. 30 tablet 0  . pantoprazole (PROTONIX) 40 MG tablet TAKE 1 TABLET BY MOUTH EVERY DAY 30 tablet 5  . prochlorperazine (COMPAZINE) 10 MG tablet Take 1 tablet (10 mg total) by mouth every 6 (six) hours as needed for nausea or vomiting. 30 tablet 1  . rosuvastatin (CRESTOR) 40 MG tablet TAKE 1 TABLET BY MOUTH DAILY 90 tablet 3  . sucralfate (CARAFATE) 1 GM/10ML suspension Take 10 mLs (1 g total) by mouth 4 (four) times daily. 420 mL 1   No current facility-administered medications for this visit.    SURGICAL HISTORY:  Past Surgical History:  Procedure Laterality Date  . CARDIAC CATHETERIZATION N/A 09/05/2015   Procedure: Left Heart Cath and Coronary Angiography;  Surgeon: Troy Sine, MD;  Location: [facility];  Service: Cardiovascular;  Laterality: N/A;  . CARDIAC CATHETERIZATION N/A 09/05/2015   Procedure: Coronary Stent Intervention;  Surgeon: Troy Sine, MD;  Location: [facility];  Service: Cardiovascular;  Laterality: N/A;  . IR IMAGING GUIDED PORT INSERTION  07/15/2019  . 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 PERIPHERAL VASCULAR CATHETERIZATION Bilateral 2003   "stenting"  . TONSILLECTOMY  ~ 1963    REVIEW OF SYSTEMS:  A comprehensive review of systems was negative except for: Constitutional: positive for fatigue   PHYSICAL EXAMINATION: General appearance: alert, cooperative, appears stated age, fatigued and no distress Head: Normocephalic, without obvious abnormality, atraumatic Neck: no adenopathy, no JVD, supple, symmetrical, trachea midline  and thyroid not enlarged, symmetric, no tenderness/mass/nodules Lymph nodes: Cervical, supraclavicular, and axillary nodes normal. Resp: clear to auscultation bilaterally Back: symmetric, no curvature. ROM normal. No CVA tenderness. Cardio: regular rate and rhythm, S1, S2 normal, no murmur, click, rub or gallop GI: soft, non-tender; bowel sounds normal; no masses,  no organomegaly Extremities: extremities normal, atraumatic, no cyanosis or edema  ECOG PERFORMANCE STATUS: 1 - Symptomatic but completely ambulatory  Blood pressure (!) 133/52, pulse 83, temperature 98 F (36.7 C), temperature source Temporal, resp. rate 17, height 6\' 3"  (1.905 m), weight 212 lb 11.2 oz (96.5 kg), SpO2 100 %.  LABORATORY DATA: Lab Results  Component Value Date   WBC 4.7 10/07/2019   HGB 9.1 (L) 10/07/2019   HCT 28.4 (L) 10/07/2019   MCV 94.0 10/07/2019   PLT 254 10/07/2019      Chemistry      Component Value Date/Time   NA 141 09/27/2019 1445   NA 140 06/08/2015 0840   K  3.8 09/27/2019 1445   CL 104 09/27/2019 1445   CO2 25 09/27/2019 1445   BUN 15 09/27/2019 1445   BUN 15 06/08/2015 0840   CREATININE 1.13 09/27/2019 1445   CREATININE 1.06 10/01/2016 0855      Component Value Date/Time   CALCIUM 9.0 09/27/2019 1445   ALKPHOS 64 09/27/2019 1445   AST 20 09/27/2019 1445   ALT 20 09/27/2019 1445   BILITOT 0.3 09/27/2019 1445       RADIOGRAPHIC STUDIES: CT Chest W Contrast  Result Date: 09/13/2019 CLINICAL DATA:  64 year old male with history of non-small cell lung cancer. Staging examination. Chemotherapy in progress. EXAM: CT CHEST, ABDOMEN, AND PELVIS WITH CONTRAST TECHNIQUE: Multidetector CT imaging of the chest, abdomen and pelvis was performed following the standard protocol during bolus administration of intravenous contrast. CONTRAST:  138mL OMNIPAQUE IOHEXOL 300 MG/ML  SOLN COMPARISON:  Chest CT 05/26/2019.  PET-CT 06/07/2019. FINDINGS: CT CHEST FINDINGS Cardiovascular: Heart size is  normal. There is no significant pericardial fluid, thickening or pericardial calcification. There is aortic atherosclerosis, as well as atherosclerosis of the great vessels of the mediastinum and the coronary arteries, including calcified atherosclerotic plaque in the left main, left anterior descending, left circumflex and right coronary arteries. Right-sided internal jugular single-lumen porta cath with tip terminating in the superior aspect of the right atrium. Mediastinum/Nodes: Multiple prominent borderline enlarged and enlarged mediastinal lymph nodes, largest of which is in the subcarinal nodal station measuring up to 2.2 cm in short axis, decreased in size compared to the prior examination. No definite supraclavicular lymphadenopathy identified on today's examination. Esophagus is unremarkable in appearance. Previously noted axillary lymph nodes have decreased in size and are nonenlarged on today's examination. Lungs/Pleura: No suspicious appearing pulmonary nodules or masses are noted. No acute consolidative airspace disease. No pleural effusions. Mild linear scarring in the lateral segment of the right middle lobe, similar to the prior examination. Musculoskeletal: There are no aggressive appearing lytic or blastic lesions noted in the visualized portions of the skeleton. CT ABDOMEN PELVIS FINDINGS Hepatobiliary: There are 2 hypovascular hepatic lesions which correspond to areas of hypermetabolism noted on prior PET-CT, largest of which is in segment 4A of the liver (axial image 50 of series 2) measuring 2.0 x 1.9 cm. In addition, in segment 5 of the liver (axial image 66 of series 2) there is a 2.1 x 1.5 cm low-attenuation lesion with some peripheral nodular hyperenhancement with progressive centripetal filling, diagnostic of a cavernous hemangioma. No intra or extrahepatic biliary ductal dilatation. Gallbladder is nearly decompressed, but otherwise unremarkable in appearance. Pancreas: No pancreatic  mass. No pancreatic ductal dilatation. No pancreatic or peripancreatic fluid collections or inflammatory changes. Spleen: Unremarkable. Adrenals/Urinary Tract: Bilateral kidneys and adrenal glands are normal in appearance. No hydroureteronephrosis. Urinary bladder is normal in appearance. Stomach/Bowel: Normal appearance of the stomach. No pathologic dilatation of small bowel or colon. Normal appendix. Vascular/Lymphatic: Aortic atherosclerosis, without evidence of aneurysm or dissection in the abdominal or pelvic vasculature. Bilateral common iliac artery stents are noted. Extensive lymphadenopathy in the upper abdomen corresponding to hypermetabolic lymph nodes on recent PET-CT. These lymph nodes have significantly decreased in size, with the largest of these lymph nodes in the celiac axis nodal station currently measuring 2 cm in short axis (axial image 57 of series 2). Reproductive: Prostate gland and seminal vesicles are unremarkable in appearance. Other: No significant volume of ascites.  No pneumoperitoneum. Musculoskeletal: Orthopedic fixation hardware in the left femoral head and neck. There are no aggressive  appearing lytic or blastic lesions noted in the visualized portions of the skeleton. IMPRESSION: 1. Today's study demonstrates positive response to therapy with regression of mediastinal, supraclavicular and axillary lymphadenopathy in the chest, and regression of 2 liver metastases and upper abdominal lymphadenopathy, as detailed above. 2. No discrete pulmonary lesion identified at this time. 3. Aortic atherosclerosis, in addition to left main and 3 vessel coronary artery disease. Please note that although the presence of coronary artery calcium documents the presence of coronary artery disease, the severity of this disease and any potential stenosis cannot be assessed on this non-gated CT examination. Assessment for potential risk factor modification, dietary therapy or pharmacologic therapy may be  warranted, if clinically indicated. 4. Additional incidental findings, as above. Electronically Signed   By: Vinnie Langton M.D.   On: 09/13/2019 11:06   CT Abdomen Pelvis W Contrast  Result Date: 09/13/2019 CLINICAL DATA:  63 year old male with history of non-small cell lung cancer. Staging examination. Chemotherapy in progress. EXAM: CT CHEST, ABDOMEN, AND PELVIS WITH CONTRAST TECHNIQUE: Multidetector CT imaging of the chest, abdomen and pelvis was performed following the standard protocol during bolus administration of intravenous contrast. CONTRAST:  153mL OMNIPAQUE IOHEXOL 300 MG/ML  SOLN COMPARISON:  Chest CT 05/26/2019.  PET-CT 06/07/2019. FINDINGS: CT CHEST FINDINGS Cardiovascular: Heart size is normal. There is no significant pericardial fluid, thickening or pericardial calcification. There is aortic atherosclerosis, as well as atherosclerosis of the great vessels of the mediastinum and the coronary arteries, including calcified atherosclerotic plaque in the left main, left anterior descending, left circumflex and right coronary arteries. Right-sided internal jugular single-lumen porta cath with tip terminating in the superior aspect of the right atrium. Mediastinum/Nodes: Multiple prominent borderline enlarged and enlarged mediastinal lymph nodes, largest of which is in the subcarinal nodal station measuring up to 2.2 cm in short axis, decreased in size compared to the prior examination. No definite supraclavicular lymphadenopathy identified on today's examination. Esophagus is unremarkable in appearance. Previously noted axillary lymph nodes have decreased in size and are nonenlarged on today's examination. Lungs/Pleura: No suspicious appearing pulmonary nodules or masses are noted. No acute consolidative airspace disease. No pleural effusions. Mild linear scarring in the lateral segment of the right middle lobe, similar to the prior examination. Musculoskeletal: There are no aggressive appearing  lytic or blastic lesions noted in the visualized portions of the skeleton. CT ABDOMEN PELVIS FINDINGS Hepatobiliary: There are 2 hypovascular hepatic lesions which correspond to areas of hypermetabolism noted on prior PET-CT, largest of which is in segment 4A of the liver (axial image 50 of series 2) measuring 2.0 x 1.9 cm. In addition, in segment 5 of the liver (axial image 66 of series 2) there is a 2.1 x 1.5 cm low-attenuation lesion with some peripheral nodular hyperenhancement with progressive centripetal filling, diagnostic of a cavernous hemangioma. No intra or extrahepatic biliary ductal dilatation. Gallbladder is nearly decompressed, but otherwise unremarkable in appearance. Pancreas: No pancreatic mass. No pancreatic ductal dilatation. No pancreatic or peripancreatic fluid collections or inflammatory changes. Spleen: Unremarkable. Adrenals/Urinary Tract: Bilateral kidneys and adrenal glands are normal in appearance. No hydroureteronephrosis. Urinary bladder is normal in appearance. Stomach/Bowel: Normal appearance of the stomach. No pathologic dilatation of small bowel or colon. Normal appendix. Vascular/Lymphatic: Aortic atherosclerosis, without evidence of aneurysm or dissection in the abdominal or pelvic vasculature. Bilateral common iliac artery stents are noted. Extensive lymphadenopathy in the upper abdomen corresponding to hypermetabolic lymph nodes on recent PET-CT. These lymph nodes have significantly decreased in size, with the  largest of these lymph nodes in the celiac axis nodal station currently measuring 2 cm in short axis (axial image 57 of series 2). Reproductive: Prostate gland and seminal vesicles are unremarkable in appearance. Other: No significant volume of ascites.  No pneumoperitoneum. Musculoskeletal: Orthopedic fixation hardware in the left femoral head and neck. There are no aggressive appearing lytic or blastic lesions noted in the visualized portions of the skeleton. IMPRESSION:  1. Today's study demonstrates positive response to therapy with regression of mediastinal, supraclavicular and axillary lymphadenopathy in the chest, and regression of 2 liver metastases and upper abdominal lymphadenopathy, as detailed above. 2. No discrete pulmonary lesion identified at this time. 3. Aortic atherosclerosis, in addition to left main and 3 vessel coronary artery disease. Please note that although the presence of coronary artery calcium documents the presence of coronary artery disease, the severity of this disease and any potential stenosis cannot be assessed on this non-gated CT examination. Assessment for potential risk factor modification, dietary therapy or pharmacologic therapy may be warranted, if clinically indicated. 4. Additional incidental findings, as above. Electronically Signed   By: Vinnie Langton M.D.   On: 09/13/2019 11:06    ASSESSMENT AND PLAN: This is a very pleasant 64 years old African-American male recently diagnosed with a stage IV (TX, N3, M1c) non-small cell lung cancer, poorly differentiated adenocarcinoma presented with bulky mediastinal as well as right hilar, supraclavicular, abdominal and right axillary lymphadenopathy in addition to metastatic liver lesions diagnosed in January 2021. Molecular studies by guardant 360 shows no actionable mutations.  The patient is not a candidate for treatment with targeted therapy or enrollment in the clinical trial with the [facility] regimen. The patient is currently undergoing systemic chemotherapy with carboplatin for AUC of 5, Alimta 500 mg/M2 and Keytruda 200 mg IV every 3 weeks status post 4 cycles. Starting from cycle #5 he will be treated with maintenance Alimta and Keytruda every 3 weeks. The patient tolerated the last cycle of his treatment well with no concerning adverse effects. I recommended for him to proceed with cycle #5 today as planned. For the pain management he will continue on Percocet on as-needed  basis. For the constipation the patient will continue his current treatment with MiraLAX. He will come back for follow-up visit in 3 weeks for evaluation before starting cycle #6. The patient was advised to call immediately if he has any concerning symptoms in the interval. The patient voices understanding of current disease status and treatment options and is in agreement with the current care plan.  All questions were answered. The patient knows to call the clinic with any problems, questions or concerns. We can certainly see the patient much sooner if necessary.  Disclaimer: This note was dictated with voice recognition software. Similar sounding words can inadvertently be transcribed and may not be corrected upon review.

## 2019-10-08 ENCOUNTER — Telehealth: Payer: Self-pay | Admitting: Internal Medicine

## 2019-10-08 NOTE — Telephone Encounter (Signed)
Called and left msg about cancelled weekly labs per los.

## 2019-10-13 ENCOUNTER — Telehealth: Payer: Self-pay | Admitting: Internal Medicine

## 2019-10-13 ENCOUNTER — Encounter: Payer: Self-pay | Admitting: *Deleted

## 2019-10-13 ENCOUNTER — Other Ambulatory Visit: Payer: Medicare Other

## 2019-10-13 NOTE — Telephone Encounter (Signed)
Scheduled per los. Called and left msg. Mailed printout  °

## 2019-10-20 ENCOUNTER — Other Ambulatory Visit: Payer: Medicare Other

## 2019-10-20 NOTE — Progress Notes (Signed)
Pharmacist Chemotherapy Monitoring - Follow Up Assessment    I verify that I have reviewed each item in the below checklist:  . Regimen for the patient is scheduled for the appropriate day and plan matches scheduled date. [name] Appropriate non-routine labs are ordered dependent on drug ordered. . If applicable, additional medications reviewed and ordered per protocol based on lifetime cumulative doses and/or treatment regimen.   Plan for follow-up and/or issues identified: No . I-vent associated with next due treatment: No . MD and/or nursing notified: No  Esther Bradstreet D 10/20/2019 2:54 PM

## 2019-10-22 ENCOUNTER — Telehealth: Payer: Self-pay | Admitting: Medical Oncology

## 2019-10-22 NOTE — Telephone Encounter (Signed)
It is one of the side effect of Alimta.  He can continue with the eyedrops for now.  If no improvement he may need to see his eye doctor.  Thank you.

## 2019-10-22 NOTE — Telephone Encounter (Signed)
"  Watery eyes and  runny nose,sinus congestion". Denies fever , facial pain.  Symptomatic therapy suggested:  OTC allergy meds,  Try eye drops for  for dry eyes. Call for fever Kirk [name] General pain/ear ache.  Claritin and Zyrtec not helping.  Mohamed to advise.

## 2019-10-22 NOTE — Telephone Encounter (Signed)
Pt.notified

## 2019-10-26 DIAGNOSIS — R42 Dizziness and giddiness: Secondary | ICD-10-CM

## 2019-10-26 HISTORY — DX: Dizziness and giddiness: R42

## 2019-10-27 ENCOUNTER — Inpatient Hospital Stay: Payer: Medicare Other | Attending: Internal Medicine | Admitting: Internal Medicine

## 2019-10-27 ENCOUNTER — Inpatient Hospital Stay: Payer: Medicare Other

## 2019-10-27 ENCOUNTER — Encounter: Payer: Self-pay | Admitting: Internal Medicine

## 2019-10-27 ENCOUNTER — Other Ambulatory Visit: Payer: Self-pay

## 2019-10-27 VITALS — BP 133/61 | HR 90 | Temp 97.5°F | Resp 18 | Ht 75.0 in | Wt 215.0 lb

## 2019-10-27 DIAGNOSIS — Z5111 Encounter for antineoplastic chemotherapy: Secondary | ICD-10-CM | POA: Insufficient documentation

## 2019-10-27 DIAGNOSIS — Z7984 Long term (current) use of oral hypoglycemic drugs: Secondary | ICD-10-CM | POA: Insufficient documentation

## 2019-10-27 DIAGNOSIS — I10 Essential (primary) hypertension: Secondary | ICD-10-CM | POA: Diagnosis not present

## 2019-10-27 DIAGNOSIS — Z95828 Presence of other vascular implants and grafts: Secondary | ICD-10-CM

## 2019-10-27 DIAGNOSIS — C3490 Malignant neoplasm of unspecified part of unspecified bronchus or lung: Secondary | ICD-10-CM

## 2019-10-27 DIAGNOSIS — R5382 Chronic fatigue, unspecified: Secondary | ICD-10-CM

## 2019-10-27 DIAGNOSIS — I252 Old myocardial infarction: Secondary | ICD-10-CM | POA: Insufficient documentation

## 2019-10-27 DIAGNOSIS — Z7902 Long term (current) use of antithrombotics/antiplatelets: Secondary | ICD-10-CM | POA: Insufficient documentation

## 2019-10-27 DIAGNOSIS — E785 Hyperlipidemia, unspecified: Secondary | ICD-10-CM | POA: Insufficient documentation

## 2019-10-27 DIAGNOSIS — Z5112 Encounter for antineoplastic immunotherapy: Secondary | ICD-10-CM | POA: Diagnosis present

## 2019-10-27 DIAGNOSIS — E1151 Type 2 diabetes mellitus with diabetic peripheral angiopathy without gangrene: Secondary | ICD-10-CM | POA: Diagnosis not present

## 2019-10-27 DIAGNOSIS — J439 Emphysema, unspecified: Secondary | ICD-10-CM | POA: Diagnosis not present

## 2019-10-27 DIAGNOSIS — M1711 Unilateral primary osteoarthritis, right knee: Secondary | ICD-10-CM | POA: Diagnosis not present

## 2019-10-27 DIAGNOSIS — C787 Secondary malignant neoplasm of liver and intrahepatic bile duct: Secondary | ICD-10-CM | POA: Insufficient documentation

## 2019-10-27 DIAGNOSIS — K5900 Constipation, unspecified: Secondary | ICD-10-CM | POA: Diagnosis not present

## 2019-10-27 DIAGNOSIS — C3491 Malignant neoplasm of unspecified part of right bronchus or lung: Secondary | ICD-10-CM | POA: Diagnosis not present

## 2019-10-27 DIAGNOSIS — T451X5A Adverse effect of antineoplastic and immunosuppressive drugs, initial encounter: Secondary | ICD-10-CM | POA: Insufficient documentation

## 2019-10-27 DIAGNOSIS — I4892 Unspecified atrial flutter: Secondary | ICD-10-CM | POA: Insufficient documentation

## 2019-10-27 DIAGNOSIS — R5383 Other fatigue: Secondary | ICD-10-CM | POA: Insufficient documentation

## 2019-10-27 DIAGNOSIS — Z79899 Other long term (current) drug therapy: Secondary | ICD-10-CM | POA: Diagnosis not present

## 2019-10-27 DIAGNOSIS — I2510 Atherosclerotic heart disease of native coronary artery without angina pectoris: Secondary | ICD-10-CM

## 2019-10-27 DIAGNOSIS — Z7982 Long term (current) use of aspirin: Secondary | ICD-10-CM | POA: Diagnosis not present

## 2019-10-27 DIAGNOSIS — D6481 Anemia due to antineoplastic chemotherapy: Secondary | ICD-10-CM | POA: Insufficient documentation

## 2019-10-27 DIAGNOSIS — M1990 Unspecified osteoarthritis, unspecified site: Secondary | ICD-10-CM | POA: Insufficient documentation

## 2019-10-27 DIAGNOSIS — Z791 Long term (current) use of non-steroidal anti-inflammatories (NSAID): Secondary | ICD-10-CM | POA: Insufficient documentation

## 2019-10-27 LAB — CMP (CANCER CENTER ONLY)
ALT: 28 U/L (ref 0–44)
AST: 30 U/L (ref 15–41)
Albumin: 3.9 g/dL (ref 3.5–5.0)
Alkaline Phosphatase: 73 U/L (ref 38–126)
Anion gap: 11 (ref 5–15)
BUN: 14 mg/dL (ref 8–23)
CO2: 24 mmol/L (ref 22–32)
Calcium: 9.7 mg/dL (ref 8.9–10.3)
Chloride: 106 mmol/L (ref 98–111)
Creatinine: 1.29 mg/dL — ABNORMAL HIGH (ref 0.61–1.24)
GFR, Est AFR Am: >60 mL/min (ref >60)
GFR, Estimated: 58 mL/min — ABNORMAL LOW (ref >60)
Glucose, Bld: 185 mg/dL — ABNORMAL HIGH (ref 70–99)
Potassium: 4 mmol/L (ref 3.5–5.1)
Sodium: 141 mmol/L (ref 135–145)
Total Bilirubin: 0.2 mg/dL — ABNORMAL LOW (ref 0.3–1.2)
Total Protein: 7.9 g/dL (ref 6.5–8.1)

## 2019-10-27 LAB — CBC WITH DIFFERENTIAL (CANCER CENTER ONLY)
Abs Immature Granulocytes: 0.01 10*3/uL (ref 0.00–0.07)
Basophils Absolute: 0.1 10*3/uL (ref 0.0–0.1)
Basophils Relative: 1 %
Eosinophils Absolute: 0.2 10*3/uL (ref 0.0–0.5)
Eosinophils Relative: 2 %
HCT: 29 % — ABNORMAL LOW (ref 39.0–52.0)
Hemoglobin: 9.2 g/dL — ABNORMAL LOW (ref 13.0–17.0)
Immature Granulocytes: 0 %
Lymphocytes Relative: 27 %
Lymphs Abs: 2.1 10*3/uL (ref 0.7–4.0)
MCH: 31.1 pg (ref 26.0–34.0)
MCHC: 31.7 g/dL (ref 30.0–36.0)
MCV: 98 fL (ref 80.0–100.0)
Monocytes Absolute: 0.9 10*3/uL (ref 0.1–1.0)
Monocytes Relative: 11 %
Neutro Abs: 4.6 10*3/uL (ref 1.7–7.7)
Neutrophils Relative %: 59 %
Platelet Count: 373 10*3/uL (ref 150–400)
RBC: 2.96 MIL/uL — ABNORMAL LOW (ref 4.22–5.81)
RDW: 18.1 % — ABNORMAL HIGH (ref 11.5–15.5)
WBC Count: 7.8 10*3/uL (ref 4.0–10.5)
nRBC: 0 % (ref 0.0–0.2)

## 2019-10-27 LAB — TSH: TSH: 0.717 u[IU]/mL (ref 0.350–4.500)

## 2019-10-27 MED ORDER — SODIUM CHLORIDE 0.9 % IV SOLN
200.0000 mg | Freq: Once | INTRAVENOUS | Status: AC
  Administered 2019-10-27: 200 mg via INTRAVENOUS
  Filled 2019-10-27: qty 8

## 2019-10-27 MED ORDER — SODIUM CHLORIDE 0.9 % IV SOLN
Freq: Once | INTRAVENOUS | Status: AC
  Filled 2019-10-27: qty 250

## 2019-10-27 MED ORDER — HEPARIN SOD (PORK) LOCK FLUSH 100 UNIT/ML IV SOLN
500.0000 [IU] | Freq: Once | INTRAVENOUS | Status: AC | PRN
  Administered 2019-10-27: 500 [IU]
  Filled 2019-10-27: qty 5

## 2019-10-27 MED ORDER — SODIUM CHLORIDE 0.9% FLUSH
10.0000 mL | INTRAVENOUS | Status: DC | PRN
  Administered 2019-10-27: 10 mL via INTRAVENOUS
  Filled 2019-10-27: qty 10

## 2019-10-27 MED ORDER — SODIUM CHLORIDE 0.9 % IV SOLN
500.0000 mg/m2 | Freq: Once | INTRAVENOUS | Status: AC
  Administered 2019-10-27: 1100 mg via INTRAVENOUS
  Filled 2019-10-27: qty 40

## 2019-10-27 MED ORDER — PROCHLORPERAZINE MALEATE 10 MG PO TABS
ORAL_TABLET | ORAL | Status: AC
  Filled 2019-10-27: qty 1

## 2019-10-27 MED ORDER — SODIUM CHLORIDE 0.9% FLUSH
10.0000 mL | INTRAVENOUS | Status: DC | PRN
  Administered 2019-10-27: 10 mL
  Filled 2019-10-27: qty 10

## 2019-10-27 MED ORDER — CYANOCOBALAMIN 1000 MCG/ML IJ SOLN
INTRAMUSCULAR | Status: AC
  Filled 2019-10-27: qty 1

## 2019-10-27 MED ORDER — PROCHLORPERAZINE MALEATE 10 MG PO TABS
10.0000 mg | ORAL_TABLET | Freq: Once | ORAL | Status: AC
  Administered 2019-10-27: 10 mg via ORAL

## 2019-10-27 MED ORDER — CYANOCOBALAMIN 1000 MCG/ML IJ SOLN
1000.0000 ug | Freq: Once | INTRAMUSCULAR | Status: AC
  Administered 2019-10-27: 1000 ug via INTRAMUSCULAR

## 2019-10-27 NOTE — Patient Instructions (Signed)
Kirk [name] Discharge Instructions for Patients Receiving Chemotherapy  Today you received the following chemotherapy agents Keytruda and Alimta  To help prevent nausea and vomiting after your treatment, we encourage you to take your nausea medication as directed.    If you develop nausea and vomiting that is not controlled by your nausea medication, call the clinic.   BELOW ARE SYMPTOMS THAT SHOULD BE REPORTED IMMEDIATELY:  *FEVER GREATER THAN 100.5 F  *CHILLS WITH OR WITHOUT FEVER  NAUSEA AND VOMITING THAT IS NOT CONTROLLED WITH YOUR NAUSEA MEDICATION  *UNUSUAL SHORTNESS OF BREATH  *UNUSUAL BRUISING OR BLEEDING  TENDERNESS IN MOUTH AND THROAT WITH OR WITHOUT PRESENCE OF ULCERS  *URINARY PROBLEMS  *BOWEL PROBLEMS  UNUSUAL RASH Items with * indicate a potential emergency and should be followed up as soon as possible.  Feel free to call the clinic should you have any questions or concerns. The clinic phone number is [phone].  Please show the [facility] at check-in to the Emergency Department and triage nurse.

## 2019-10-27 NOTE — Progress Notes (Signed)
[facility] [phone]   [phone]  OFFICE PROGRESS NOTE  [name], FNP [name] [address]  DIAGNOSIS: Stage IV (TX, N3, M1c) non-small cell lung cancer, poorly differentiated adenocarcinoma presented with bulky mediastinal lymphadenopathy in addition to right axillary lymphadenopathy and metastatic liver lesions, abdominal lymphadenopathy diagnosed in January 2021. Molecular studies by [employer] that shows no actionable mutations.  PRIOR THERAPY: None  CURRENT THERAPY: Systemic chemotherapy with carboplatin for AUC of 5, Alimta 500 mg/M2 and Keytruda 200 mg IV every 3 weeks.  First dose July 14, 2019.  Status post 5 cycles. Starting from cycle #5 the patient will be treated with maintenance Alimta and Keytruda every 3 weeks.  INTERVAL HISTORY: Kirk [name] 64 y.o. male returns to the clinic today for follow-up visit.  The patient is feeling fine today with no concerning complaints except for mild fatigue.  He denied having any current chest pain, shortness of breath, cough or hemoptysis.  He has no nausea, vomiting, diarrhea but has constipation treated with MiraLAX as well as milk of magnesia.  He denied having any headache or visual changes.  He has no recent weight loss or night sweats.  He tolerated the last cycle of his treatment fairly well.  He is here today for evaluation before starting cycle #6 of his treatment.  MEDICAL HISTORY: Past Medical History:  Diagnosis Date  . Arthritis    "right knee" (09/04/2015)  . Atrial flutter with rapid ventricular response ([facility]) 04/19/2019  . Dyspnea    increased exertion  . HTN (hypertension)   . Hyperlipidemia   . Lung cancer ([facility]) 05/2019  . NSTEMI (non-ST elevated myocardial infarction) ([facility]) 09/04/2015   a. cath 09/05/2015: 95% stenosis mid-LAD, 55% mid-RCA, and 40% prox-RCA. PCI performed w/ Synergy DES to LAD  . PAD (peripheral artery disease) ([facility])     Stenting of bilateral iliacs in 2003.  [name] Refusal of blood transfusions as patient is Jehovah's Witness   . Type II diabetes mellitus ([facility])     ALLERGIES:  has No Known Allergies.  MEDICATIONS:  Current Outpatient Medications  Medication Sig Dispense Refill  . acetaminophen (TYLENOL) 500 MG tablet Take 500 mg by mouth every 6 (six) hours as needed for fever.    [name] aspirin EC 81 MG tablet Take 81 mg by mouth at bedtime.    . carvedilol (COREG) 12.5 MG tablet Take 1 tablet (12.5 mg total) by mouth 2 (two) times daily with a meal. 180 tablet 3  . cetirizine (ZYRTEC) 10 MG tablet TAKE 1 TABLET BY MOUTH DAILY 30 tablet 0  . clopidogrel (PLAVIX) 75 MG tablet Take 1 tablet (75 mg total) by mouth daily. 90 tablet 3  . folic acid (FOLVITE) 1 MG tablet TAKE 1 TABLET BY MOUTH EVERY DAY 90 tablet 1  . glimepiride (AMARYL) 4 MG tablet TAKE 1 TABLET BY MOUTH EVERY DAY 90 tablet 1  . HYDROcodone-homatropine (HYCODAN) 5-1.5 MG/5ML syrup Take 5 mLs by mouth every 8 (eight) hours as needed for cough. (Patient not taking: Reported on 08/09/2019) 120 mL 0  . Hydrocortisone Acetate 1 % CREA Apply 15 g topically 2 (two) times daily. 15 g 3  . ibuprofen (ADVIL) 200 MG tablet Take 600 mg by mouth once.    [name] ipratropium (ATROVENT) 0.03 % nasal spray USE 2 SPRAYS IN EACH NOSTRIL TWICE DAILY 30 mL 2  . lactulose (CHRONULAC) 10 GM/15ML solution Take 30 mLs (20  g total) by mouth 2 (two) times daily as needed for moderate constipation. 240 mL 3  . lidocaine-prilocaine (EMLA) cream APPLY TO THE PORT-A-CATH SITE 30-60 MINUTES BEFORE TREATMENT. 30 g 0  . lisinopril (ZESTRIL) 10 MG tablet TAKE 1 TABLET BY MOUTH EVERY DAY 90 tablet 1  . metFORMIN (GLUCOPHAGE) 1000 MG tablet TAKE 1 TABLET (1,000 MG TOTAL) BY MOUTH 2 (TWO) TIMES DAILY WITH A MEAL. 180 tablet 1  . multivitamin (ONE-A-DAY MEN'S) TABS tablet Take 1 tablet by mouth daily. 30 tablet 6  . nitroGLYCERIN (NITROSTAT) 0.4 MG SL tablet Place 1 tablet (0.4 mg total) under  the tongue every 5 (five) minutes x 3 doses as needed for chest pain. (Patient not taking: Reported on 08/09/2019) 25 tablet 2  . oxyCODONE-acetaminophen (PERCOCET/ROXICET) 5-325 MG tablet Take 1 tablet by mouth every 6 (six) hours as needed for severe pain. (Patient not taking: Reported on 10/27/2019) 30 tablet 0  . pantoprazole (PROTONIX) 40 MG tablet TAKE 1 TABLET BY MOUTH EVERY DAY 30 tablet 5  . prochlorperazine (COMPAZINE) 10 MG tablet Take 1 tablet (10 mg total) by mouth every 6 (six) hours as needed for nausea or vomiting. 30 tablet 1  . rosuvastatin (CRESTOR) 40 MG tablet TAKE 1 TABLET BY MOUTH DAILY 90 tablet 3  . sucralfate (CARAFATE) 1 GM/10ML suspension Take 10 mLs (1 g total) by mouth 4 (four) times daily. 420 mL 1   No current facility-administered medications for this visit.    SURGICAL HISTORY:  Past Surgical History:  Procedure Laterality Date  . CARDIAC CATHETERIZATION N/A 09/05/2015   Procedure: Left Heart Cath and Coronary Angiography;  Surgeon: Troy Sine, MD;  Location: [facility];  Service: Cardiovascular;  Laterality: N/A;  . CARDIAC CATHETERIZATION N/A 09/05/2015   Procedure: Coronary Stent Intervention;  Surgeon: Troy Sine, MD;  Location: [facility];  Service: Cardiovascular;  Laterality: N/A;  . IR IMAGING GUIDED PORT INSERTION  07/15/2019  . 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 PERIPHERAL VASCULAR CATHETERIZATION Bilateral 2003   "stenting"  . TONSILLECTOMY  ~ 1963    REVIEW OF SYSTEMS:  A comprehensive review of systems was negative except for: Constitutional: positive for fatigue   PHYSICAL EXAMINATION: General appearance: alert, cooperative, appears stated age, fatigued and no distress Head: Normocephalic, without obvious abnormality, atraumatic Neck: no adenopathy, no JVD, supple, symmetrical, trachea midline and thyroid not enlarged, symmetric, no tenderness/mass/nodules Lymph nodes: Cervical,  supraclavicular, and axillary nodes normal. Resp: clear to auscultation bilaterally Back: symmetric, no curvature. ROM normal. No CVA tenderness. Cardio: regular rate and rhythm, S1, S2 normal, no murmur, click, rub or gallop GI: soft, non-tender; bowel sounds normal; no masses,  no organomegaly Extremities: extremities normal, atraumatic, no cyanosis or edema  ECOG PERFORMANCE STATUS: 1 - Symptomatic but completely ambulatory  Blood pressure 133/61, pulse 90, temperature (!) 97.5 F (36.4 C), temperature source Temporal, resp. rate 18, height 6\' 3"  (1.905 m), weight 215 lb (97.5 kg), SpO2 100 %.  LABORATORY DATA: Lab Results  Component Value Date   WBC 7.8 10/27/2019   HGB 9.2 (L) 10/27/2019   HCT 29.0 (L) 10/27/2019   MCV 98.0 10/27/2019   PLT 373 10/27/2019      Chemistry      Component Value Date/Time   NA 141 10/07/2019 1123   NA 140 06/08/2015 0840   K 3.7 10/07/2019 1123   CL 105 10/07/2019 1123   CO2 23  10/07/2019 1123   BUN 10 10/07/2019 1123   BUN 15 06/08/2015 0840   CREATININE 1.26 (H) 10/07/2019 1123   CREATININE 1.06 10/01/2016 0855      Component Value Date/Time   CALCIUM 9.1 10/07/2019 1123   ALKPHOS 65 10/07/2019 1123   AST 28 10/07/2019 1123   ALT 25 10/07/2019 1123   BILITOT 0.2 (L) 10/07/2019 1123       RADIOGRAPHIC STUDIES: No results found.  ASSESSMENT AND PLAN: This is a very pleasant 64 years old African-American male recently diagnosed with a stage IV (TX, N3, M1c) non-small cell lung cancer, poorly differentiated adenocarcinoma presented with bulky mediastinal as well as right hilar, supraclavicular, abdominal and right axillary lymphadenopathy in addition to metastatic liver lesions diagnosed in January 2021. Molecular studies by [employer]ant 360 shows no actionable mutations.  The patient is not a candidate for treatment with targeted therapy or enrollment in the clinical trial with the [facility] regimen. The patient is currently undergoing  systemic chemotherapy with carboplatin for AUC of 5, Alimta 500 mg/M2 and Keytruda 200 mg IV every 3 weeks status post 5 cycles. Starting from cycle #5 he is treated with maintenance Alimta and Keytruda every 3 weeks. The patient tolerated the first cycle of his maintenance treatment fairly well. I recommended for him to proceed with cycle #6 today as planned. He will come back for follow-up visit in 3 weeks for evaluation with repeat CT scan of the chest, abdomen and pelvis for restaging of his disease. For the pain management he will continue on Percocet on as-needed basis. For the constipation the patient will continue his current treatment with MiraLAX and milk of magnesia as needed. The patient was advised to call immediately if he has any concerning symptoms in the interval. The patient voices understanding of current disease status and treatment options and is in agreement with the current care plan.  All questions were answered. The patient knows to call the clinic with any problems, questions or concerns. We can certainly see the patient much sooner if necessary.  Disclaimer: This note was dictated with voice recognition software. Similar sounding words can inadvertently be transcribed and may not be corrected upon review.

## 2019-11-10 ENCOUNTER — Ambulatory Visit: Payer: Medicare Other | Admitting: Family Medicine

## 2019-11-12 ENCOUNTER — Other Ambulatory Visit: Payer: Self-pay | Admitting: Family Medicine

## 2019-11-12 DIAGNOSIS — J302 Other seasonal allergic rhinitis: Secondary | ICD-10-CM

## 2019-11-15 ENCOUNTER — Ambulatory Visit (HOSPITAL_COMMUNITY)
Admission: RE | Admit: 2019-11-15 | Discharge: 2019-11-15 | Disposition: A | Discharge status: Home / Self Care | Payer: Medicare Other | Source: Ambulatory Visit | Attending: Internal Medicine | Admitting: Internal Medicine

## 2019-11-15 ENCOUNTER — Encounter (HOSPITAL_COMMUNITY): Payer: Self-pay

## 2019-11-15 ENCOUNTER — Other Ambulatory Visit: Payer: Self-pay

## 2019-11-15 DIAGNOSIS — C3490 Malignant neoplasm of unspecified part of unspecified bronchus or lung: Secondary | ICD-10-CM

## 2019-11-15 MED ORDER — HEPARIN SOD (PORK) LOCK FLUSH 100 UNIT/ML IV SOLN
INTRAVENOUS | Status: AC
  Administered 2019-11-15: 500 [IU] via INTRAVENOUS
  Filled 2019-11-15: qty 5

## 2019-11-15 MED ORDER — HEPARIN SOD (PORK) LOCK FLUSH 100 UNIT/ML IV SOLN: 500.0000 [IU] | Freq: Once | INTRAVENOUS | Status: AC

## 2019-11-15 MED ORDER — SODIUM CHLORIDE (PF) 0.9 % IJ SOLN
INTRAMUSCULAR | Status: AC
  Filled 2019-11-15: qty 50

## 2019-11-15 MED ORDER — IOHEXOL 300 MG/ML  SOLN
100.0000 mL | Freq: Once | INTRAMUSCULAR | Status: AC | PRN
  Administered 2019-11-15: 100 mL via INTRAVENOUS

## 2019-11-16 ENCOUNTER — Inpatient Hospital Stay: Payer: Medicare Other

## 2019-11-16 ENCOUNTER — Encounter: Payer: Self-pay | Admitting: Internal Medicine

## 2019-11-16 ENCOUNTER — Inpatient Hospital Stay (HOSPITAL_BASED_OUTPATIENT_CLINIC_OR_DEPARTMENT_OTHER): Payer: Medicare Other | Admitting: Internal Medicine

## 2019-11-16 ENCOUNTER — Other Ambulatory Visit: Payer: Self-pay

## 2019-11-16 VITALS — BP 140/53 | HR 99 | Temp 97.5°F | Resp 19 | Ht 75.0 in | Wt 211.7 lb

## 2019-11-16 DIAGNOSIS — C3490 Malignant neoplasm of unspecified part of unspecified bronchus or lung: Secondary | ICD-10-CM | POA: Diagnosis not present

## 2019-11-16 DIAGNOSIS — I2510 Atherosclerotic heart disease of native coronary artery without angina pectoris: Secondary | ICD-10-CM | POA: Diagnosis not present

## 2019-11-16 DIAGNOSIS — C3491 Malignant neoplasm of unspecified part of right bronchus or lung: Secondary | ICD-10-CM

## 2019-11-16 DIAGNOSIS — Z5111 Encounter for antineoplastic chemotherapy: Secondary | ICD-10-CM | POA: Diagnosis not present

## 2019-11-16 DIAGNOSIS — Z5112 Encounter for antineoplastic immunotherapy: Secondary | ICD-10-CM

## 2019-11-16 DIAGNOSIS — R5382 Chronic fatigue, unspecified: Secondary | ICD-10-CM

## 2019-11-16 DIAGNOSIS — Z95828 Presence of other vascular implants and grafts: Secondary | ICD-10-CM

## 2019-11-16 DIAGNOSIS — C787 Secondary malignant neoplasm of liver and intrahepatic bile duct: Secondary | ICD-10-CM | POA: Diagnosis not present

## 2019-11-16 LAB — CMP (CANCER CENTER ONLY)
ALT: 30 U/L (ref 0–44)
AST: 33 U/L (ref 15–41)
Albumin: 3.2 g/dL — ABNORMAL LOW (ref 3.5–5.0)
Alkaline Phosphatase: 71 U/L (ref 38–126)
Anion gap: 13 (ref 5–15)
BUN: 11 mg/dL (ref 8–23)
CO2: 22 mmol/L (ref 22–32)
Calcium: 9.2 mg/dL (ref 8.9–10.3)
Chloride: 104 mmol/L (ref 98–111)
Creatinine: 1.4 mg/dL — ABNORMAL HIGH (ref 0.61–1.24)
GFR, Est AFR Am: >60 mL/min (ref >60)
GFR, Estimated: 52 mL/min — ABNORMAL LOW (ref >60)
Glucose, Bld: 248 mg/dL — ABNORMAL HIGH (ref 70–99)
Potassium: 4 mmol/L (ref 3.5–5.1)
Sodium: 139 mmol/L (ref 135–145)
Total Bilirubin: 0.2 mg/dL — ABNORMAL LOW (ref 0.3–1.2)
Total Protein: 8.5 g/dL — ABNORMAL HIGH (ref 6.5–8.1)

## 2019-11-16 LAB — CBC WITH DIFFERENTIAL (CANCER CENTER ONLY)
Abs Immature Granulocytes: 0.04 10*3/uL (ref 0.00–0.07)
Basophils Absolute: 0.1 10*3/uL (ref 0.0–0.1)
Basophils Relative: 1 %
Eosinophils Absolute: 0.2 10*3/uL (ref 0.0–0.5)
Eosinophils Relative: 1 %
HCT: 24.9 % — ABNORMAL LOW (ref 39.0–52.0)
Hemoglobin: 8 g/dL — ABNORMAL LOW (ref 13.0–17.0)
Immature Granulocytes: 0 %
Lymphocytes Relative: 23 %
Lymphs Abs: 2.5 10*3/uL (ref 0.7–4.0)
MCH: 31 pg (ref 26.0–34.0)
MCHC: 32.1 g/dL (ref 30.0–36.0)
MCV: 96.5 fL (ref 80.0–100.0)
Monocytes Absolute: 1.3 10*3/uL — ABNORMAL HIGH (ref 0.1–1.0)
Monocytes Relative: 12 %
Neutro Abs: 6.8 10*3/uL (ref 1.7–7.7)
Neutrophils Relative %: 63 %
Platelet Count: 420 10*3/uL — ABNORMAL HIGH (ref 150–400)
RBC: 2.58 MIL/uL — ABNORMAL LOW (ref 4.22–5.81)
RDW: 15 % (ref 11.5–15.5)
WBC Count: 10.8 10*3/uL — ABNORMAL HIGH (ref 4.0–10.5)
nRBC: 0 % (ref 0.0–0.2)

## 2019-11-16 LAB — TSH: TSH: 0.299 u[IU]/mL — ABNORMAL LOW (ref 0.320–4.118)

## 2019-11-16 MED ORDER — PROCHLORPERAZINE MALEATE 10 MG PO TABS
ORAL_TABLET | ORAL | Status: AC
  Filled 2019-11-16: qty 1

## 2019-11-16 MED ORDER — CYANOCOBALAMIN 1000 MCG/ML IJ SOLN
1000.0000 ug | Freq: Once | INTRAMUSCULAR | Status: AC
  Administered 2019-11-16: 1000 ug via INTRAMUSCULAR

## 2019-11-16 MED ORDER — PROCHLORPERAZINE MALEATE 10 MG PO TABS
10.0000 mg | ORAL_TABLET | Freq: Once | ORAL | Status: AC
  Administered 2019-11-16: 10 mg via ORAL

## 2019-11-16 MED ORDER — SODIUM CHLORIDE 0.9 % IV SOLN
200.0000 mg | Freq: Once | INTRAVENOUS | Status: AC
  Administered 2019-11-16: 200 mg via INTRAVENOUS
  Filled 2019-11-16: qty 8

## 2019-11-16 MED ORDER — SODIUM CHLORIDE 0.9% FLUSH
10.0000 mL | INTRAVENOUS | Status: DC | PRN
  Administered 2019-11-16: 10 mL
  Filled 2019-11-16: qty 10

## 2019-11-16 MED ORDER — SODIUM CHLORIDE 0.9% FLUSH
10.0000 mL | INTRAVENOUS | Status: DC | PRN
  Administered 2019-11-16: 10 mL via INTRAVENOUS
  Filled 2019-11-16: qty 10

## 2019-11-16 MED ORDER — CYANOCOBALAMIN 1000 MCG/ML IJ SOLN
INTRAMUSCULAR | Status: AC
  Filled 2019-11-16: qty 1

## 2019-11-16 MED ORDER — SODIUM CHLORIDE 0.9 % IV SOLN
Freq: Once | INTRAVENOUS | Status: AC
  Filled 2019-11-16: qty 250

## 2019-11-16 MED ORDER — SODIUM CHLORIDE 0.9 % IV SOLN
500.0000 mg/m2 | Freq: Once | INTRAVENOUS | Status: AC
  Administered 2019-11-16: 1100 mg via INTRAVENOUS
  Filled 2019-11-16: qty 40

## 2019-11-16 MED ORDER — HEPARIN SOD (PORK) LOCK FLUSH 100 UNIT/ML IV SOLN
500.0000 [IU] | Freq: Once | INTRAVENOUS | Status: AC | PRN
  Administered 2019-11-16: 500 [IU]
  Filled 2019-11-16: qty 5

## 2019-11-16 NOTE — Progress Notes (Signed)
[place] [phone]) [phone]   [phone]  OFFICE PROGRESS NOTE  [name], FNP [name] [address]  DIAGNOSIS: Stage IV (TX, N3, M1c) non-small cell lung cancer, poorly differentiated adenocarcinoma presented with bulky mediastinal lymphadenopathy in addition to right axillary lymphadenopathy and metastatic liver lesions, abdominal lymphadenopathy diagnosed in January 2021. Molecular studies by [employer] 360 that shows no actionable mutations.  PRIOR THERAPY: None  CURRENT THERAPY: Systemic chemotherapy with carboplatin for AUC of 5, Alimta 500 mg/M2 and Keytruda 200 mg IV every 3 weeks.  First dose July 14, 2019.  Status post 6 cycles. Starting from cycle #5 the patient will be treated with maintenance Alimta and Keytruda every 3 weeks.  INTERVAL HISTORY: Kirk [name] 64 y.o. male returns to the clinic today for follow-up visit.  The patient is feeling fine today with no concerning complaints.  He denied having any chest pain, shortness of breath, cough or hemoptysis.  He continues to have fatigue secondary to chemotherapy-induced anemia but he refused transfusion because of being Jehovah's Witness.  The patient denied having any nausea, vomiting, diarrhea or constipation.  He denied having any fever or chills.  He has no weight loss or night sweats.  He continues to tolerate his treatment well with no concerning adverse effects.  The patient had repeat CT scan of the chest, abdomen pelvis performed recently and is here for evaluation and discussion of his scan results.  MEDICAL HISTORY: Past Medical History:  Diagnosis Date  . Arthritis    "right knee" (09/04/2015)  . Atrial flutter with rapid ventricular response ([facility]) 04/19/2019  . Dyspnea    increased exertion  . HTN (hypertension)   . Hyperlipidemia   . Lung cancer ([facility]) 05/2019  . NSTEMI (non-ST elevated myocardial infarction) ([facility]) 09/04/2015   a. cath  09/05/2015: 95% stenosis mid-LAD, 55% mid-RCA, and 40% prox-RCA. PCI performed w/ Synergy DES to LAD  . PAD (peripheral artery disease) ([facility])    Stenting of bilateral iliacs in 2003.  [name] Refusal of blood transfusions as patient is Jehovah's Witness   . Type II diabetes mellitus ([facility])     ALLERGIES:  has No Known Allergies.  MEDICATIONS:  Current Outpatient Medications  Medication Sig Dispense Refill  . acetaminophen (TYLENOL) 500 MG tablet Take 500 mg by mouth every 6 (six) hours as needed for fever.    [name] aspirin EC 81 MG tablet Take 81 mg by mouth at bedtime.    . carvedilol (COREG) 12.5 MG tablet Take 1 tablet (12.5 mg total) by mouth 2 (two) times daily with a meal. 180 tablet 3  . cetirizine (ZYRTEC) 10 MG tablet TAKE 1 TABLET BY MOUTH DAILY 30 tablet 0  . clopidogrel (PLAVIX) 75 MG tablet Take 1 tablet (75 mg total) by mouth daily. 90 tablet 3  . folic acid (FOLVITE) 1 MG tablet TAKE 1 TABLET BY MOUTH EVERY DAY 90 tablet 1  . glimepiride (AMARYL) 4 MG tablet TAKE 1 TABLET BY MOUTH EVERY DAY 90 tablet 1  . HYDROcodone-homatropine (HYCODAN) 5-1.5 MG/5ML syrup Take 5 mLs by mouth every 8 (eight) hours as needed for cough. (Patient not taking: Reported on 08/09/2019) 120 mL 0  . Hydrocortisone Acetate 1 % CREA Apply 15 g topically 2 (two) times daily. 15 g 3  . ibuprofen (ADVIL) 200 MG tablet Take 600 mg by mouth once.    [name] ipratropium (ATROVENT) 0.03 % nasal spray USE 2 SPRAYS IN [facility]  NOSTRIL TWICE DAILY 30 mL 2  . lactulose (CHRONULAC) 10 GM/15ML solution Take 30 mLs (20 g total) by mouth 2 (two) times daily as needed for moderate constipation. 240 mL 3  . lidocaine-prilocaine (EMLA) cream APPLY TO THE PORT-A-CATH SITE 30-60 MINUTES BEFORE TREATMENT. 30 g 0  . lisinopril (ZESTRIL) 10 MG tablet TAKE 1 TABLET BY MOUTH EVERY DAY 90 tablet 1  . metFORMIN (GLUCOPHAGE) 1000 MG tablet TAKE 1 TABLET (1,000 MG TOTAL) BY MOUTH 2 (TWO) TIMES DAILY WITH A MEAL. 180 tablet 1  . multivitamin (ONE-A-DAY  MEN'S) TABS tablet Take 1 tablet by mouth daily. 30 tablet 6  . nitroGLYCERIN (NITROSTAT) 0.4 MG SL tablet Place 1 tablet (0.4 mg total) under the tongue every 5 (five) minutes x 3 doses as needed for chest pain. (Patient not taking: Reported on 08/09/2019) 25 tablet 2  . oxyCODONE-acetaminophen (PERCOCET/ROXICET) 5-325 MG tablet Take 1 tablet by mouth every 6 (six) hours as needed for severe pain. (Patient not taking: Reported on 10/27/2019) 30 tablet 0  . pantoprazole (PROTONIX) 40 MG tablet TAKE 1 TABLET BY MOUTH EVERY DAY 30 tablet 5  . prochlorperazine (COMPAZINE) 10 MG tablet Take 1 tablet (10 mg total) by mouth every 6 (six) hours as needed for nausea or vomiting. 30 tablet 1  . rosuvastatin (CRESTOR) 40 MG tablet TAKE 1 TABLET BY MOUTH DAILY 90 tablet 3  . sucralfate (CARAFATE) 1 GM/10ML suspension Take 10 mLs (1 g total) by mouth 4 (four) times daily. 420 mL 1   No current facility-administered medications for this visit.    SURGICAL HISTORY:  Past Surgical History:  Procedure Laterality Date  . CARDIAC CATHETERIZATION N/A 09/05/2015   Procedure: Left Heart Cath and Coronary Angiography;  Surgeon: Troy Sine, MD;  Location: [facility];  Service: Cardiovascular;  Laterality: N/A;  . CARDIAC CATHETERIZATION N/A 09/05/2015   Procedure: Coronary Stent Intervention;  Surgeon: Troy Sine, MD;  Location: [facility];  Service: Cardiovascular;  Laterality: N/A;  . IR IMAGING GUIDED PORT INSERTION  07/15/2019  . 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 PERIPHERAL VASCULAR CATHETERIZATION Bilateral 2003   "stenting"  . TONSILLECTOMY  ~ 1963    REVIEW OF SYSTEMS:  Constitutional: positive for fatigue Eyes: negative Ears, nose, mouth, throat, and face: negative Respiratory: negative Cardiovascular: negative Gastrointestinal: negative Genitourinary:negative Integument/breast: negative Hematologic/lymphatic:  negative Musculoskeletal:negative Neurological: negative Behavioral/Psych: negative Endocrine: negative Allergic/Immunologic: negative   PHYSICAL EXAMINATION: General appearance: alert, cooperative, appears stated age, fatigued and no distress Head: Normocephalic, without obvious abnormality, atraumatic Neck: no adenopathy, no JVD, supple, symmetrical, trachea midline and thyroid not enlarged, symmetric, no tenderness/mass/nodules Lymph nodes: Cervical, supraclavicular, and axillary nodes normal. Resp: clear to auscultation bilaterally Back: symmetric, no curvature. ROM normal. No CVA tenderness. Cardio: regular rate and rhythm, S1, S2 normal, no murmur, click, rub or gallop GI: soft, non-tender; bowel sounds normal; no masses,  no organomegaly Extremities: extremities normal, atraumatic, no cyanosis or edema Neurologic: Alert and oriented X 3, normal strength and tone. Normal symmetric reflexes. Normal coordination and gait  ECOG PERFORMANCE STATUS: 1 - Symptomatic but completely ambulatory  Blood pressure (!) 140/53, pulse 99, temperature (!) 97.5 F (36.4 C), temperature source Temporal, resp. rate 19, height 6\' 3"  (1.905 m), weight 211 lb 11.2 oz (96 kg), SpO2 99 %.  LABORATORY DATA: Lab Results  Component Value Date   WBC 10.8 (H) 11/16/2019   HGB 8.0 (L) 11/16/2019  HCT 24.9 (L) 11/16/2019   MCV 96.5 11/16/2019   PLT 420 (H) 11/16/2019      Chemistry      Component Value Date/Time   NA 141 10/27/2019 1150   NA 140 06/08/2015 0840   K 4.0 10/27/2019 1150   CL 106 10/27/2019 1150   CO2 24 10/27/2019 1150   BUN 14 10/27/2019 1150   BUN 15 06/08/2015 0840   CREATININE 1.29 (H) 10/27/2019 1150   CREATININE 1.06 10/01/2016 0855      Component Value Date/Time   CALCIUM 9.7 10/27/2019 1150   ALKPHOS 73 10/27/2019 1150   AST 30 10/27/2019 1150   ALT 28 10/27/2019 1150   BILITOT 0.2 (L) 10/27/2019 1150       RADIOGRAPHIC STUDIES: CT Chest W Contrast  Result  Date: 11/15/2019 CLINICAL DATA:  Non-small cell lung cancer. EXAM: CT CHEST, ABDOMEN, AND PELVIS WITH CONTRAST TECHNIQUE: Multidetector CT imaging of the chest, abdomen and pelvis was performed following the standard protocol during bolus administration of intravenous contrast. CONTRAST:  111mL OMNIPAQUE IOHEXOL 300 MG/ML  SOLN COMPARISON:  09/13/2019. FINDINGS: CT CHEST FINDINGS Cardiovascular: Right IJ Port-A-Cath terminates in the high right atrium. Atherosclerotic calcification of the aorta and coronary arteries. Pulmonic trunk is enlarged. Heart size normal. No pericardial effusion. Mediastinum/Nodes: Subcarinal lymph node measures 1.8 cm, stable. Additional mediastinal lymph nodes measure up to 9 mm in the low right paratracheal station. No hilar adenopathy. Axillary lymph nodes measure up to 12 mm on the right, stable. Periesophageal lymph nodes are not enlarged by CT size criteria and appear stable. Esophagus is grossly unremarkable. Lungs/Pleura: Centrilobular emphysema. Peribronchovascular scarring in the lateral segment right middle lobe. Lungs are otherwise clear. No pleural fluid. Airway is unremarkable. Musculoskeletal: Degenerative changes in the spine. No worrisome lytic or sclerotic lesions. CT ABDOMEN PELVIS FINDINGS Hepatobiliary: Ill-defined lesion in segment 4 of the liver has decreased in size slightly now measuring 11 mm (2/51), compared to 2.0 cm on 09/13/2019. 7 mm low-attenuation lesion in the peripheral dome of the right hepatic lobe has also decreased in size, now measuring 7 mm, compared to 10 mm previously. A low-attenuation lesion with peripheral contrast puddling in the inferior right hepatic lobe measures 2.0 cm, is stable from 09/13/2019 and is not visualized on delayed nephrographic phase imaging, indicative of a hemangioma. Liver and gallbladder are otherwise unremarkable. No biliary ductal dilatation. Pancreas: Negative. Spleen: Negative. Adrenals/Urinary Tract: Adrenal glands  are unremarkable. There may be a slightly striated appearance of the kidneys on nephrographic phase imaging. Kidneys are otherwise unremarkable. Ureters are decompressed. Stomach/Bowel: Stomach, small bowel, appendix and colon are unremarkable. Vascular/Lymphatic: Atherosclerotic calcification of the aorta without aneurysm. Bilateral common iliac stents. Gastrohepatic ligament and periportal adenopathy is unchanged. Index periportal lymph node measures 1.6 cm (2/58). Small lymph nodes are seen throughout the retroperitoneum and small bowel mesentery, similar. Reproductive: Prostate is visualized. Other: No free fluid. Mesenteries and peritoneum are otherwise unremarkable. Musculoskeletal: Degenerative changes in the spine. No worrisome lytic or sclerotic lesions. Two pins are seen in the proximal left femur. Degenerative changes in the right hip. IMPRESSION: 1. Continued regression of hepatic metastases. Stable borderline to slightly enlarged lymph nodes in the chest, abdomen and pelvis. 2. Slightly striated appearance of the kidneys on nephrographic phase imaging, of questionable clinical significance. Consider correlation with urinalysis. 3. Aortic atherosclerosis (ICD10-I70.0). Coronary artery calcification. 4. Enlarged pulmonic trunk, indicative of pulmonary arterial hypertension. 5.  Emphysema (ICD10-J43.9). Electronically Signed   By: Lorin Picket M.D.  On: 11/15/2019 12:10   CT Abdomen Pelvis W Contrast  Result Date: 11/15/2019 CLINICAL DATA:  Non-small cell lung cancer. EXAM: CT CHEST, ABDOMEN, AND PELVIS WITH CONTRAST TECHNIQUE: Multidetector CT imaging of the chest, abdomen and pelvis was performed following the standard protocol during bolus administration of intravenous contrast. CONTRAST:  142mL OMNIPAQUE IOHEXOL 300 MG/ML  SOLN COMPARISON:  09/13/2019. FINDINGS: CT CHEST FINDINGS Cardiovascular: Right IJ Port-A-Cath terminates in the high right atrium. Atherosclerotic calcification of the aorta  and coronary arteries. Pulmonic trunk is enlarged. Heart size normal. No pericardial effusion. Mediastinum/Nodes: Subcarinal lymph node measures 1.8 cm, stable. Additional mediastinal lymph nodes measure up to 9 mm in the low right paratracheal station. No hilar adenopathy. Axillary lymph nodes measure up to 12 mm on the right, stable. Periesophageal lymph nodes are not enlarged by CT size criteria and appear stable. Esophagus is grossly unremarkable. Lungs/Pleura: Centrilobular emphysema. Peribronchovascular scarring in the lateral segment right middle lobe. Lungs are otherwise clear. No pleural fluid. Airway is unremarkable. Musculoskeletal: Degenerative changes in the spine. No worrisome lytic or sclerotic lesions. CT ABDOMEN PELVIS FINDINGS Hepatobiliary: Ill-defined lesion in segment 4 of the liver has decreased in size slightly now measuring 11 mm (2/51), compared to 2.0 cm on 09/13/2019. 7 mm low-attenuation lesion in the peripheral dome of the right hepatic lobe has also decreased in size, now measuring 7 mm, compared to 10 mm previously. A low-attenuation lesion with peripheral contrast puddling in the inferior right hepatic lobe measures 2.0 cm, is stable from 09/13/2019 and is not visualized on delayed nephrographic phase imaging, indicative of a hemangioma. Liver and gallbladder are otherwise unremarkable. No biliary ductal dilatation. Pancreas: Negative. Spleen: Negative. Adrenals/Urinary Tract: Adrenal glands are unremarkable. There may be a slightly striated appearance of the kidneys on nephrographic phase imaging. Kidneys are otherwise unremarkable. Ureters are decompressed. Stomach/Bowel: Stomach, small bowel, appendix and colon are unremarkable. Vascular/Lymphatic: Atherosclerotic calcification of the aorta without aneurysm. Bilateral common iliac stents. Gastrohepatic ligament and periportal adenopathy is unchanged. Index periportal lymph node measures 1.6 cm (2/58). Small lymph nodes are seen  throughout the retroperitoneum and small bowel mesentery, similar. Reproductive: Prostate is visualized. Other: No free fluid. Mesenteries and peritoneum are otherwise unremarkable. Musculoskeletal: Degenerative changes in the spine. No worrisome lytic or sclerotic lesions. Two pins are seen in the proximal left femur. Degenerative changes in the right hip. IMPRESSION: 1. Continued regression of hepatic metastases. Stable borderline to slightly enlarged lymph nodes in the chest, abdomen and pelvis. 2. Slightly striated appearance of the kidneys on nephrographic phase imaging, of questionable clinical significance. Consider correlation with urinalysis. 3. Aortic atherosclerosis (ICD10-I70.0). Coronary artery calcification. 4. Enlarged pulmonic trunk, indicative of pulmonary arterial hypertension. 5.  Emphysema (ICD10-J43.9). Electronically Signed   By: Lorin Picket M.D.   On: 11/15/2019 12:10    ASSESSMENT AND PLAN: This is a very pleasant 64 years old African-American male recently diagnosed with a stage IV (TX, N3, M1c) non-small cell lung cancer, poorly differentiated adenocarcinoma presented with bulky mediastinal as well as right hilar, supraclavicular, abdominal and right axillary lymphadenopathy in addition to metastatic liver lesions diagnosed in January 2021. Molecular studies by guardant 360 shows no actionable mutations.  The patient is not a candidate for treatment with targeted therapy or enrollment in the clinical trial with the [facility] regimen. The patient is currently undergoing systemic chemotherapy with carboplatin for AUC of 5, Alimta 500 mg/M2 and Keytruda 200 mg IV every 3 weeks status post 6 cycles. Starting from cycle #5 he  is treated with maintenance Alimta and Keytruda every 3 weeks. He continues to tolerate this treatment well with no concerning adverse effect except for fatigue secondary to chemotherapy-induced anemia. The patient had repeat CT scan of the chest, abdomen pelvis  performed recently.  I personally and independently reviewed the scans and discussed the results with the patient today. His scan showed continuous improvement of his disease with no concerning findings for progression. I recommended for the patient to continue his current treatment with maintenance Alimta and Keytruda every 3 weeks and he will proceed with cycle #7 today. For the chemotherapy-induced anemia, the patient was advised to take oral iron tablets at regular basis.  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 For pain management he will continue on Percocet on as-needed basis. For constipation the patient is currently on treatment with MiraLAX and milk of magnesia as needed. He will come back for follow-up visit in 3 weeks for evaluation before the next cycle of his treatment. The patient was advised to call immediately if he has any concerning symptoms in the interval.  The patient voices understanding of current disease status and treatment options and is in agreement with the current care plan.  All questions were answered. The patient knows to call the clinic with any problems, questions or concerns. We can certainly see the patient much sooner if necessary.  Disclaimer: This note was dictated with voice recognition software. Similar sounding words can inadvertently be transcribed and may not be corrected upon review.

## 2019-11-16 NOTE — Patient Instructions (Signed)
[place] [facility] Discharge Instructions for Patients Receiving Chemotherapy  Today you received the following chemotherapy agents: Keytruda, Alimta  To help prevent nausea and vomiting after your treatment, we encourage you to take your nausea medication as directed.    If you develop nausea and vomiting that is not controlled by your nausea medication, call the clinic.   BELOW ARE SYMPTOMS THAT SHOULD BE REPORTED IMMEDIATELY:  *FEVER GREATER THAN 100.5 F  *CHILLS WITH OR WITHOUT FEVER  NAUSEA AND VOMITING THAT IS NOT CONTROLLED WITH YOUR NAUSEA MEDICATION  *UNUSUAL SHORTNESS OF BREATH  *UNUSUAL BRUISING OR BLEEDING  TENDERNESS IN MOUTH AND THROAT WITH OR WITHOUT PRESENCE OF ULCERS  *URINARY PROBLEMS  *BOWEL PROBLEMS  UNUSUAL RASH Items with * indicate a potential emergency and should be followed up as soon as possible.  Feel free to call the clinic should you have any questions or concerns. The clinic phone number is [phone].  Please show the [place] at check-in to the Emergency Department and triage nurse.

## 2019-11-17 ENCOUNTER — Telehealth: Payer: Self-pay | Admitting: Medical Oncology

## 2019-11-17 NOTE — Telephone Encounter (Signed)
"  May I get my teeth cleaned tomorrow"? I told him it is okay .

## 2019-11-22 ENCOUNTER — Telehealth: Payer: Self-pay | Admitting: Medical Oncology

## 2019-11-22 ENCOUNTER — Other Ambulatory Visit: Payer: Self-pay

## 2019-11-22 ENCOUNTER — Ambulatory Visit (INDEPENDENT_AMBULATORY_CARE_PROVIDER_SITE_OTHER): Payer: Medicare Other | Admitting: Family Medicine

## 2019-11-22 ENCOUNTER — Encounter: Payer: Self-pay | Admitting: Family Medicine

## 2019-11-22 VITALS — BP 126/65 | HR 94 | Temp 97.5°F | Ht 75.0 in | Wt 210.0 lb

## 2019-11-22 DIAGNOSIS — R7303 Prediabetes: Secondary | ICD-10-CM | POA: Diagnosis not present

## 2019-11-22 DIAGNOSIS — E119 Type 2 diabetes mellitus without complications: Secondary | ICD-10-CM

## 2019-11-22 DIAGNOSIS — K5900 Constipation, unspecified: Secondary | ICD-10-CM

## 2019-11-22 DIAGNOSIS — D649 Anemia, unspecified: Secondary | ICD-10-CM

## 2019-11-22 DIAGNOSIS — I10 Essential (primary) hypertension: Secondary | ICD-10-CM

## 2019-11-22 DIAGNOSIS — R739 Hyperglycemia, unspecified: Secondary | ICD-10-CM | POA: Diagnosis not present

## 2019-11-22 DIAGNOSIS — E1151 Type 2 diabetes mellitus with diabetic peripheral angiopathy without gangrene: Secondary | ICD-10-CM

## 2019-11-22 DIAGNOSIS — C3490 Malignant neoplasm of unspecified part of unspecified bronchus or lung: Secondary | ICD-10-CM

## 2019-11-22 DIAGNOSIS — Z09 Encounter for follow-up examination after completed treatment for conditions other than malignant neoplasm: Secondary | ICD-10-CM

## 2019-11-22 LAB — GLUCOSE, POCT (MANUAL RESULT ENTRY): POC Glucose: 257 mg/dl — AB (ref 70–99)

## 2019-11-22 LAB — POCT URINALYSIS DIPSTICK
Bilirubin, UA: NEGATIVE
Blood, UA: NEGATIVE
Glucose, UA: POSITIVE — AB
Ketones, UA: NEGATIVE
Leukocytes, UA: NEGATIVE
Nitrite, UA: NEGATIVE
Protein, UA: POSITIVE — AB
Spec Grav, UA: 1.02 (ref 1.010–1.025)
Urobilinogen, UA: 0.2 E.U./dL
pH, UA: 5 (ref 5.0–8.0)

## 2019-11-22 LAB — POCT GLYCOSYLATED HEMOGLOBIN (HGB A1C)
HbA1c POC (<> result, manual entry): 6.5 % (ref 4.0–5.6)
HbA1c, POC (controlled diabetic range): 6.5 % (ref 0.0–7.0)
HbA1c, POC (prediabetic range): 6.5 % — AB (ref 5.7–6.4)
Hemoglobin A1C: 6.5 % — AB (ref 4.0–5.6)

## 2019-11-22 MED ORDER — FERROUS SULFATE 324 MG PO TBEC: 324.0000 mg | DELAYED_RELEASE_TABLET | Freq: Every day | ORAL | 11 refills | Status: DC

## 2019-11-22 MED ORDER — LACTULOSE 10 GM/15ML PO SOLN: 20.0000 g | Freq: Two times a day (BID) | ORAL | 11 refills | Status: DC | PRN

## 2019-11-22 MED ORDER — DOCUSATE SODIUM 100 MG PO CAPS: 100.0000 mg | ORAL_CAPSULE | Freq: Two times a day (BID) | ORAL | 11 refills | Status: DC | PRN

## 2019-11-22 NOTE — Telephone Encounter (Signed)
Weak , lightheaded. [name] HGB 8 last week. He does not take blood  Pt encouraged to eat foods containing iron.  Should he take iron supplement?

## 2019-11-22 NOTE — Progress Notes (Signed)
Patient [facility]   Established Patient Office Visit  Subjective:  Patient ID: Kirk [name], male    DOB: Feb 20, 1956  Age: 64 y.o. MRN: [id number]  CC:  Chief Complaint  Patient presents with  . Follow-up    3 month follow up;     HPI Kirk [name] is a 64 year old male who presents for Follow Up today.     Patient Active Problem List   Diagnosis Date Noted  . Metastatic non-small cell lung cancer ([place]) 07/07/2019  . Encounter for antineoplastic chemotherapy 07/07/2019  . Encounter for antineoplastic immunotherapy 07/07/2019  . Goals of care, counseling/discussion 06/28/2019  . Adenocarcinoma of right lung, stage 4 ([place]) 06/28/2019  . Cancer associated pain 06/28/2019  . CAD (coronary artery disease) 06/08/2019  . Mediastinal lymphadenopathy 06/03/2019  . Liver metastasis ([place]) 06/03/2019  . Hemoglobin A1C between 7% and 9% indicating borderline diabetic control 05/10/2019  . COVID-19 virus infection 05/10/2019  . Atrial flutter with rapid ventricular response ([place]) 04/19/2019  . Chest pain with moderate risk for cardiac etiology 04/19/2019  . Paroxysmal atrial flutter ([place]) 04/19/2019  . Chest congestion 07/10/2018  . Cough 07/10/2018  . NSTEMI (non-ST elevated myocardial infarction) ([place]) 09/04/2015  . PVD (peripheral vascular disease) ([place]) 06/08/2015  . DDD (degenerative disc disease), cervical 06/08/2015  . Hyperlipidemia 06/08/2015  . Diabetes mellitus with nephropathy ([place]) 06/08/2015  . Diabetes mellitus with peripheral vascular disease ([place]) 06/08/2015  . Annual physical exam 01/03/2015  . PAD (peripheral artery disease) ([place]) 02/18/2013  . Diabetes ([place]) 02/18/2013  . HTN (hypertension) 02/18/2013    Past Medical History:  Diagnosis Date  . Arthritis    "right knee" (09/04/2015)  . Atrial flutter with rapid ventricular response ([place]) 04/19/2019  . Dizziness 10/2019  . Dyspnea    increased exertion  .  HTN (hypertension)   . Hyperlipidemia   . Lung cancer ([place]) 05/2019  . NSTEMI (non-ST elevated myocardial infarction) ([place]) 09/04/2015   a. cath 09/05/2015: 95% stenosis mid-LAD, 55% mid-RCA, and 40% prox-RCA. PCI performed w/ Synergy DES to LAD  . PAD (peripheral artery disease) (H[facility])    Stenting of bilateral iliacs in 2003.  [name] Refusal of blood transfusions as patient is Jehovah's Witness   . Type II diabetes mellitus ([place])     Current Status: Since his last office visit, he is doing well with no complaints. He is currently receiving chemotherapy for Metastatic Non-Small Cell Lung Cancer. He denies visual changes, chest pain, cough, shortness of breath, heart palpitations, and falls. He has occasional headaches and dizziness with position changes. Denies severe headaches, confusion, seizures, double vision, and blurred vision, nausea and vomiting. His anxiety is moderate today, r/t his recent cancer diagnosis. He denies suicidal ideations, homicidal ideations, or auditory hallucinations. He denies fevers, chills, fatigue, recent infections, weight loss, and night sweats. Denies GI problems such as diarrhea, and constipation. He has no reports of blood in stools, dysuria and hematuria. He is taking all medications as prescribed. He denies pain today.    Past Surgical History:  Procedure Laterality Date  . CARDIAC CATHETERIZATION N/A 09/05/2015   Procedure: Left Heart Cath and Coronary Angiography;  Surgeon: Troy Sine, MD;  Location: [facility];  Service: Cardiovascular;  Laterality: N/A;  . CARDIAC CATHETERIZATION N/A 09/05/2015   Procedure: Coronary Stent Intervention;  Surgeon: Troy Sine, MD;  Location: [facility];  Service: Cardiovascular;  Laterality: N/A;  .  IR IMAGING GUIDED PORT INSERTION  07/15/2019  . 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 PERIPHERAL VASCULAR CATHETERIZATION Bilateral 2003   "stenting"  . TONSILLECTOMY  ~ 1963     Family History  Problem Relation Age of Onset  . Lymphoma Mother   . Cancer - Prostate Father   . CAD Neg Hx   . Colon cancer Neg Hx   . Rectal cancer Neg Hx   . Stomach cancer Neg Hx   . Esophageal cancer Neg Hx     Social History   Socioeconomic History  . Marital status: Married    Spouse name: Not on file  . Number of children: 1  . Years of education: Not on file  . Highest education level: Not on file  Occupational History  . Occupation: Acupuncturist  Tobacco Use  . Smoking status: Former Smoker    Packs/day: 2.00    Years: 35.00    Pack years: 70.00    Types: Cigarettes    Quit date: 02/19/2003    Years since quitting: 16.7  . Smokeless tobacco: Never Used  Vaping Use  . Vaping Use: Never used  Substance and Sexual Activity  . Alcohol use: Not Currently    Alcohol/week: 0.0 standard drinks    Comment: 09/04/2015 "nothing since 1980s"  . 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 Sexual activity: Not Currently  Other Topics Concern  . Not on file  Social History Narrative  . Not on file   Social Determinants of Health   Financial Resource Strain:   . Difficulty of Paying Living Expenses:   Food Insecurity:   . Worried About Charity fundraiser in the Last Year:   . Arboriculturist in the Last Year:   Transportation Needs:   . Film/video editor (Medical):   [name] Lack of Transportation (Non-Medical):   Physical Activity:   . Days of Exercise per Week:   . Minutes of Exercise per Session:   Stress:   . Feeling of Stress :   Social Connections:   . Frequency of Communication with Friends and Family:   . Frequency of Social Gatherings with Friends and Family:   . Attends Religious Services:   . Active Member of Clubs or Organizations:   . Attends Archivist Meetings:   [name] Marital Status:   Intimate Partner Violence:   . Fear of Current or Ex-Partner:   . Emotionally Abused:   [name] Physically  Abused:   . Sexually Abused:     Outpatient Medications Prior to Visit  Medication Sig Dispense Refill  . acetaminophen (TYLENOL) 500 MG tablet Take 500 mg by mouth every 6 (six) hours as needed for fever.    [name] aspirin EC 81 MG tablet Take 81 mg by mouth at bedtime.    . carvedilol (COREG) 12.5 MG tablet Take 1 tablet (12.5 mg total) by mouth 2 (two) times daily with a meal. 180 tablet 3  . cetirizine (ZYRTEC) 10 MG tablet TAKE 1 TABLET BY MOUTH DAILY 30 tablet 0  . folic acid (FOLVITE) 1 MG tablet TAKE 1 TABLET BY MOUTH EVERY DAY 90 tablet 1  . glimepiride (AMARYL) 4 MG tablet TAKE 1 TABLET BY MOUTH EVERY DAY 90 tablet 1  . HYDROcodone-homatropine (HYCODAN) 5-1.5 MG/5ML syrup Take 5 mLs by mouth every 8 (eight) hours as needed for cough.  120 mL 0  . Hydrocortisone Acetate 1 % CREA Apply 15 g topically 2 (two) times daily. (Patient not taking: Reported on 11/26/2019) 15 g 3  . ibuprofen (ADVIL) 200 MG tablet Take 600 mg by mouth once.    [name] ipratropium (ATROVENT) 0.03 % nasal spray USE 2 SPRAYS IN EACH NOSTRIL TWICE DAILY 30 mL 2  . lidocaine-prilocaine (EMLA) cream APPLY TO THE PORT-A-CATH SITE 30-60 MINUTES BEFORE TREATMENT. 30 g 0  . lisinopril (ZESTRIL) 10 MG tablet TAKE 1 TABLET BY MOUTH EVERY DAY 90 tablet 1  . metFORMIN (GLUCOPHAGE) 1000 MG tablet TAKE 1 TABLET (1,000 MG TOTAL) BY MOUTH 2 (TWO) TIMES DAILY WITH A MEAL. 180 tablet 1  . multivitamin (ONE-A-DAY MEN'S) TABS tablet Take 1 tablet by mouth daily. 30 tablet 6  . nitroGLYCERIN (NITROSTAT) 0.4 MG SL tablet Place 1 tablet (0.4 mg total) under the tongue every 5 (five) minutes x 3 doses as needed for chest pain. 25 tablet 2  . oxyCODONE-acetaminophen (PERCOCET/ROXICET) 5-325 MG tablet Take 1 tablet by mouth every 6 (six) hours as needed for severe pain. 30 tablet 0  . pantoprazole (PROTONIX) 40 MG tablet TAKE 1 TABLET BY MOUTH EVERY DAY 30 tablet 5  . prochlorperazine (COMPAZINE) 10 MG tablet Take 1 tablet (10 mg total) by mouth every  6 (six) hours as needed for nausea or vomiting. 30 tablet 1  . rosuvastatin (CRESTOR) 40 MG tablet TAKE 1 TABLET BY MOUTH DAILY 90 tablet 3  . sucralfate (CARAFATE) 1 GM/10ML suspension Take 10 mLs (1 g total) by mouth 4 (four) times daily. 420 mL 1  . lactulose (CHRONULAC) 10 GM/15ML solution Take 30 mLs (20 g total) by mouth 2 (two) times daily as needed for moderate constipation. 240 mL 3  . clopidogrel (PLAVIX) 75 MG tablet Take 1 tablet (75 mg total) by mouth daily. 90 tablet 3   No facility-administered medications prior to visit.    No Known Allergies  ROS Review of Systems  Constitutional: Negative.   HENT: Negative.   Eyes: Negative.   Respiratory: Negative.   Cardiovascular: Negative.   Gastrointestinal: Negative.   Endocrine: Negative.   Genitourinary: Negative.   Musculoskeletal: Negative.   Skin: Negative.   Allergic/Immunologic: Negative.   Neurological: Positive for dizziness (occasional ), weakness (Generalized) and headaches (occasional ).  Hematological: Negative.   Psychiatric/Behavioral: Negative.       Objective:    Physical Exam Vitals and nursing note reviewed.  Constitutional:      Appearance: Normal appearance.  HENT:     Head: Normocephalic and atraumatic.     Nose: Nose normal.     Mouth/Throat:     Mouth: Mucous membranes are moist.     Pharynx: Oropharynx is clear.  Cardiovascular:     Rate and Rhythm: Normal rate and regular rhythm.     Pulses: Normal pulses.     Heart sounds: Normal heart sounds.  Pulmonary:     Effort: Pulmonary effort is normal.     Breath sounds: Normal breath sounds.  Abdominal:     General: Bowel sounds are normal.     Palpations: Abdomen is soft.  Musculoskeletal:        General: Normal range of motion.     Cervical back: Normal range of motion and neck supple.  Skin:    General: Skin is warm and dry.  Neurological:     General: No focal deficit present.     Mental Status: He is alert and oriented  to  person, place, and time.  Psychiatric:     Comments: Tearful today, r/t cancer diagnosis     BP 126/65   Pulse 94   Temp (!) 97.5 F (36.4 C)   Ht 6\' 3"  (1.905 m)   Wt 210 lb (95.3 kg)   SpO2 98%   BMI 26.25 kg/m  Wt Readings from Last 3 Encounters:  11/26/19 211 lb (95.7 kg)  11/22/19 210 lb (95.3 kg)  11/16/19 211 lb 11.2 oz (96 kg)     Health Maintenance Due  Topic Date Due  . COVID-19 Vaccine (1) Never done  . FOOT EXAM  07/02/2017  . OPHTHALMOLOGY EXAM  07/11/2018    There are no preventive care reminders to display for this patient.  Lab Results  Component Value Date   TSH 0.299 (L) 11/16/2019   Lab Results  Component Value Date   WBC 10.8 (H) 11/16/2019   HGB 8.0 (L) 11/16/2019   HCT 24.9 (L) 11/16/2019   MCV 96.5 11/16/2019   PLT 420 (H) 11/16/2019   Lab Results  Component Value Date   NA 139 11/16/2019   K 4.0 11/16/2019   CO2 22 11/16/2019   GLUCOSE 248 (H) 11/16/2019   BUN 11 11/16/2019   CREATININE 1.40 (H) 11/16/2019   BILITOT 0.2 (L) 11/16/2019   ALKPHOS 71 11/16/2019   AST 33 11/16/2019   ALT 30 11/16/2019   PROT 8.5 (H) 11/16/2019   ALBUMIN 3.2 (L) 11/16/2019   CALCIUM 9.2 11/16/2019   ANIONGAP 13 11/16/2019   GFR 101.07 05/31/2019   Lab Results  Component Value Date   CHOL 102 07/22/2019   Lab Results  Component Value Date   HDL 34 (L) 07/22/2019   Lab Results  Component Value Date   LDLCALC 48 07/22/2019   Lab Results  Component Value Date   TRIG 106 07/22/2019   Lab Results  Component Value Date   CHOLHDL 3.0 07/22/2019   Lab Results  Component Value Date   HGBA1C 6.5 (A) 11/22/2019   HGBA1C 6.5 11/22/2019   HGBA1C 6.5 (A) 11/22/2019   HGBA1C 6.5 11/22/2019      Assessment & Plan:   1. Diabetes mellitus with peripheral vascular disease ([place]) He will continue medication as prescribed, to decrease foods/beverages high in sugars and carbs and follow Heart Healthy or DASH diet. Increase physical activity to at  least 30 minutes cardio exercise daily.  - Urinalysis Dipstick - Glucose (CBG) - HgB A1c  2. Hemoglobin A1C between 7% and 9% indicating borderline diabetic control Hgb A1c is stable at 6.5 today. Monitor.   3. Hyperglycemia  4. Essential hypertension The current medical regimen is effective; blood prssure is stable at 126/65 today; continue present plan and medications as prescribed. He will continue to take medications as prescribed, to decrease high sodium intake, excessive alcohol intake, increase potassium intake, smoking cessation, and increase physical activity of at least 30 minutes of cardio activity daily. He will continue to follow Heart Healthy or DASH diet. - Urinalysis Dipstick  5. Metastatic non-small cell lung cancer [facility]) He will continue chemotherapy treatments as prescribed.   6. Constipation, unspecified constipation type - docusate sodium (COLACE) 100 MG capsule; Take 1 capsule (100 mg total) by mouth 2 (two) times daily as needed for mild constipation.  Dispense: 60 capsule; Refill: 11 - lactulose (CHRONULAC) 10 GM/15ML solution; Take 30 mLs (20 g total) by mouth 2 (two) times daily as needed for moderate constipation.  Dispense: 240 mL; Refill:  11  7. Anemia, unspecified type - ferrous sulfate 324 MG TBEC; Take 1 tablet (324 mg total) by mouth daily with breakfast.  Dispense: 30 tablet; Refill: 11  8. Follow up He will follow up in 6 months.   Meds ordered this encounter  Medications  . docusate sodium (COLACE) 100 MG capsule    Sig: Take 1 capsule (100 mg total) by mouth 2 (two) times daily as needed for mild constipation.    Dispense:  60 capsule    Refill:  11  . lactulose (CHRONULAC) 10 GM/15ML solution    Sig: Take 30 mLs (20 g total) by mouth 2 (two) times daily as needed for moderate constipation.    Dispense:  240 mL    Refill:  11  . ferrous sulfate 324 MG TBEC    Sig: Take 1 tablet (324 mg total) by mouth daily with breakfast.    Dispense:  30  tablet    Refill:  11   Orders Placed This Encounter  Procedures  . Urinalysis Dipstick  . Glucose (CBG)  . HgB A1c    Referral Orders  No referral(s) requested today    Kathe Becton,  MSN, FNP-BC [place], [place] [address] [phone] fax  Problem List Items Addressed This Visit      Cardiovascular and Mediastinum   Diabetes mellitus with peripheral vascular disease ([place]) - Primary   Relevant Orders   Urinalysis Dipstick (Completed)   Glucose (CBG) (Completed)   HgB A1c (Completed)   HTN (hypertension)   Relevant Orders   Urinalysis Dipstick (Completed)     Respiratory   Metastatic non-small cell lung cancer ([place])     Endocrine   Hemoglobin A1C between 7% and 9% indicating borderline diabetic control    Other Visit Diagnoses    Hyperglycemia       Constipation, unspecified constipation type       Relevant Medications   docusate sodium (COLACE) 100 MG capsule   lactulose (CHRONULAC) 10 GM/15ML solution   Anemia, unspecified type       Relevant Medications   ferrous sulfate 324 MG TBEC   Follow up          Meds ordered this encounter  Medications  . docusate sodium (COLACE) 100 MG capsule    Sig: Take 1 capsule (100 mg total) by mouth 2 (two) times daily as needed for mild constipation.    Dispense:  60 capsule    Refill:  11  . lactulose (CHRONULAC) 10 GM/15ML solution    Sig: Take 30 mLs (20 g total) by mouth 2 (two) times daily as needed for moderate constipation.    Dispense:  240 mL    Refill:  11  . ferrous sulfate 324 MG TBEC    Sig: Take 1 tablet (324 mg total) by mouth daily with breakfast.    Dispense:  30 tablet    Refill:  11    Follow-up: Return in about 6 months (around 05/23/2020).    [name], FNP

## 2019-11-22 NOTE — Patient Instructions (Signed)

## 2019-11-26 ENCOUNTER — Other Ambulatory Visit: Payer: Self-pay

## 2019-11-26 ENCOUNTER — Telehealth: Payer: Self-pay | Admitting: Medical Oncology

## 2019-11-26 ENCOUNTER — Encounter: Payer: Self-pay | Admitting: Family Medicine

## 2019-11-26 ENCOUNTER — Ambulatory Visit (INDEPENDENT_AMBULATORY_CARE_PROVIDER_SITE_OTHER): Payer: Medicare Other | Admitting: Family Medicine

## 2019-11-26 VITALS — BP 126/53 | HR 78 | Ht 75.0 in | Wt 211.0 lb

## 2019-11-26 DIAGNOSIS — R42 Dizziness and giddiness: Secondary | ICD-10-CM | POA: Diagnosis not present

## 2019-11-26 DIAGNOSIS — C3490 Malignant neoplasm of unspecified part of unspecified bronchus or lung: Secondary | ICD-10-CM | POA: Diagnosis not present

## 2019-11-26 DIAGNOSIS — E1151 Type 2 diabetes mellitus with diabetic peripheral angiopathy without gangrene: Secondary | ICD-10-CM | POA: Diagnosis not present

## 2019-11-26 DIAGNOSIS — Z5111 Encounter for antineoplastic chemotherapy: Secondary | ICD-10-CM

## 2019-11-26 DIAGNOSIS — Z09 Encounter for follow-up examination after completed treatment for conditions other than malignant neoplasm: Secondary | ICD-10-CM

## 2019-11-26 LAB — GLUCOSE, POCT (MANUAL RESULT ENTRY): POC Glucose: 144 mg/dl — AB (ref 70–99)

## 2019-11-26 MED ORDER — MECLIZINE HCL 25 MG PO TABS: 25.0000 mg | ORAL_TABLET | Freq: Three times a day (TID) | ORAL | 3 refills | Status: DC | PRN

## 2019-11-26 NOTE — Telephone Encounter (Addendum)
"  Balance is off. I almost fell several times. I lose my balance and just tumble over.  I vomited suddenly. It just  came out of no where". Speech clear . Normal bowel/bladder pattern. Denies headaches, vision problems, any  extremity weakness.   Per Cassie , I instructed pt to monitor symptoms for now , increase fluid intake and to go to ED for worsening symptoms or  Weakness or numbness of the face , arm or legs on one side of body  , vision changes, difficulty talking or understanding others , headache, frequent falls.

## 2019-11-26 NOTE — Progress Notes (Signed)
Patient [name]  Sick Visit   Subjective:  Patient ID: Kirk [name], male    DOB: 1955-06-01  Age: 64 y.o. MRN: [id number]  CC:  Chief Complaint  Patient presents with  . Dizziness    started last night     HPI Kirk [name] is a 64 year old male who presents for Sick Visit today.    Patient Active Problem List   Diagnosis Date Noted  . Metastatic non-small cell lung cancer ([place]) 07/07/2019  . Encounter for antineoplastic chemotherapy 07/07/2019  . Encounter for antineoplastic immunotherapy 07/07/2019  . Goals of care, counseling/discussion 06/28/2019  . Adenocarcinoma of right lung, stage 4 ([place]) 06/28/2019  . Cancer associated pain 06/28/2019  . CAD (coronary artery disease) 06/08/2019  . Mediastinal lymphadenopathy 06/03/2019  . Liver metastasis ([place]) 06/03/2019  . Hemoglobin A1C between 7% and 9% indicating borderline diabetic control 05/10/2019  . COVID-19 virus infection 05/10/2019  . Atrial flutter with rapid ventricular response ([place]) 04/19/2019  . Chest pain with moderate risk for cardiac etiology 04/19/2019  . Paroxysmal atrial flutter ([place]) 04/19/2019  . Chest congestion 07/10/2018  . Cough 07/10/2018  . NSTEMI (non-ST elevated myocardial infarction) ([place]) 09/04/2015  . PVD (peripheral vascular disease) ([place]) 06/08/2015  . DDD (degenerative disc disease), cervical 06/08/2015  . Hyperlipidemia 06/08/2015  . Diabetes mellitus with nephropathy ([place]) 06/08/2015  . Diabetes mellitus with peripheral vascular disease ([place]) 06/08/2015  . Annual physical exam 01/03/2015  . PAD (peripheral artery disease) ([place]) 02/18/2013  . Diabetes ([place]) 02/18/2013  . HTN (hypertension) 02/18/2013    Past Medical History:  Diagnosis Date  . Arthritis    "right knee" (09/04/2015)  . Atrial flutter with rapid ventricular response ([place]) 04/19/2019  . Dizziness 10/2019  . Dyspnea    increased exertion  . HTN (hypertension)   .  Hyperlipidemia   . Lung cancer ([place]) 05/2019  . NSTEMI (non-ST elevated myocardial infarction) ([place]) 09/04/2015   a. cath 09/05/2015: 95% stenosis mid-LAD, 55% mid-RCA, and 40% prox-RCA. PCI performed w/ Synergy DES to LAD  . PAD (peripheral artery disease) ([facility])    Stenting of bilateral iliacs in 2003.  [name] Refusal of blood transfusions as patient is Jehovah's Witness   . Type II diabetes mellitus ([place])     Current Status: Since his last office visit, he is currently receiving chemotherapy treatments every 3 weeks now. He reports increasing episodes of dizziness and nasal drainage lately. He denies visual changes, chest pain, cough, shortness of breath, heart palpitations, and falls. He has occasional headaches and dizzines with position changes. Denies severe headaches, confusion, seizures, double vision, and blurred vision, nausea and vomiting. He denies fevers, chills, recent infections, weight loss, and night sweats. Denies GI problems such as diarrhea, and constipation. He has no reports of blood in stools, dysuria and hematuria. No depression or anxiety reported today. He is taking all medications as prescribed. He denies pain today.   Past Surgical History:  Procedure Laterality Date  . CARDIAC CATHETERIZATION N/A 09/05/2015   Procedure: Left Heart Cath and Coronary Angiography;  Surgeon: Troy Sine, MD;  Location: [facility];  Service: Cardiovascular;  Laterality: N/A;  . CARDIAC CATHETERIZATION N/A 09/05/2015   Procedure: Coronary Stent Intervention;  Surgeon: Troy Sine, MD;  Location: [facility];  Service: Cardiovascular;  Laterality: N/A;  . IR IMAGING GUIDED PORT INSERTION  07/15/2019  . ORIF CONGENITAL HIP DISLOCATION Left ~ 1965   "  put pins in it"  . PERIPHERAL VASCULAR CATHETERIZATION Bilateral 2003   "stenting"  . TONSILLECTOMY  ~ 1963    Family History  Problem Relation Age of Onset  . Lymphoma Mother   . Cancer - Prostate Father   . CAD Neg Hx   .  Colon cancer Neg Hx   . Rectal cancer Neg Hx   . Stomach cancer Neg Hx   . Esophageal cancer Neg Hx     Social History   Socioeconomic History  . Marital status: Married    Spouse name: Not on file  . Number of children: 1  . Years of education: Not on file  . Highest education level: Not on file  Occupational History  . Occupation: Acupuncturist  Tobacco Use  . Smoking status: Former Smoker    Packs/day: 2.00    Years: 35.00    Pack years: 70.00    Types: Cigarettes    Quit date: 02/19/2003    Years since quitting: 16.7  . Smokeless tobacco: Never Used  Vaping Use  . Vaping Use: Never used  Substance and Sexual Activity  . Alcohol use: Not Currently    Alcohol/week: 0.0 standard drinks    Comment: 09/04/2015 "nothing since 1980s"  . 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 Sexual activity: Not Currently  Other Topics Concern  . Not on file  Social History Narrative  . Not on file   Social Determinants of Health   Financial Resource Strain:   . Difficulty of Paying Living Expenses:   Food Insecurity:   . Worried About Charity fundraiser in the Last Year:   . Arboriculturist in the Last Year:   Transportation Needs:   . Film/video editor (Medical):   [name] Lack of Transportation (Non-Medical):   Physical Activity:   . Days of Exercise per Week:   . Minutes of Exercise per Session:   Stress:   . Feeling of Stress :   Social Connections:   . Frequency of Communication with Friends and Family:   . Frequency of Social Gatherings with Friends and Family:   . Attends Religious Services:   . Active Member of Clubs or Organizations:   . Attends Archivist Meetings:   [name] Marital Status:   Intimate Partner Violence:   . Fear of Current or Ex-Partner:   . Emotionally Abused:   [name] Physically Abused:   . Sexually Abused:     Outpatient Medications Prior to Visit  Medication Sig Dispense Refill  . acetaminophen  (TYLENOL) 500 MG tablet Take 500 mg by mouth every 6 (six) hours as needed for fever.    [name] aspirin EC 81 MG tablet Take 81 mg by mouth at bedtime.    . carvedilol (COREG) 12.5 MG tablet Take 1 tablet (12.5 mg total) by mouth 2 (two) times daily with a meal. 180 tablet 3  . cetirizine (ZYRTEC) 10 MG tablet TAKE 1 TABLET BY MOUTH DAILY 30 tablet 0  . clopidogrel (PLAVIX) 75 MG tablet Take 1 tablet (75 mg total) by mouth daily. 90 tablet 3  . docusate sodium (COLACE) 100 MG capsule Take 1 capsule (100 mg total) by mouth 2 (two) times daily as needed for mild constipation. 60 capsule 11  . ferrous sulfate 324 MG TBEC Take 1 tablet (324 mg total) by mouth daily with breakfast. 30 tablet 11  . folic acid (FOLVITE)  1 MG tablet TAKE 1 TABLET BY MOUTH EVERY DAY 90 tablet 1  . glimepiride (AMARYL) 4 MG tablet TAKE 1 TABLET BY MOUTH EVERY DAY 90 tablet 1  . HYDROcodone-homatropine (HYCODAN) 5-1.5 MG/5ML syrup Take 5 mLs by mouth every 8 (eight) hours as needed for cough. 120 mL 0  . ibuprofen (ADVIL) 200 MG tablet Take 600 mg by mouth once.    [name] ipratropium (ATROVENT) 0.03 % nasal spray USE 2 SPRAYS IN EACH NOSTRIL TWICE DAILY 30 mL 2  . lactulose (CHRONULAC) 10 GM/15ML solution Take 30 mLs (20 g total) by mouth 2 (two) times daily as needed for moderate constipation. 240 mL 11  . lidocaine-prilocaine (EMLA) cream APPLY TO THE PORT-A-CATH SITE 30-60 MINUTES BEFORE TREATMENT. 30 g 0  . lisinopril (ZESTRIL) 10 MG tablet TAKE 1 TABLET BY MOUTH EVERY DAY 90 tablet 1  . metFORMIN (GLUCOPHAGE) 1000 MG tablet TAKE 1 TABLET (1,000 MG TOTAL) BY MOUTH 2 (TWO) TIMES DAILY WITH A MEAL. 180 tablet 1  . multivitamin (ONE-A-DAY MEN'S) TABS tablet Take 1 tablet by mouth daily. 30 tablet 6  . nitroGLYCERIN (NITROSTAT) 0.4 MG SL tablet Place 1 tablet (0.4 mg total) under the tongue every 5 (five) minutes x 3 doses as needed for chest pain. 25 tablet 2  . oxyCODONE-acetaminophen (PERCOCET/ROXICET) 5-325 MG tablet Take 1 tablet  by mouth every 6 (six) hours as needed for severe pain. 30 tablet 0  . pantoprazole (PROTONIX) 40 MG tablet TAKE 1 TABLET BY MOUTH EVERY DAY 30 tablet 5  . prochlorperazine (COMPAZINE) 10 MG tablet Take 1 tablet (10 mg total) by mouth every 6 (six) hours as needed for nausea or vomiting. 30 tablet 1  . rosuvastatin (CRESTOR) 40 MG tablet TAKE 1 TABLET BY MOUTH DAILY 90 tablet 3  . sucralfate (CARAFATE) 1 GM/10ML suspension Take 10 mLs (1 g total) by mouth 4 (four) times daily. 420 mL 1  . Hydrocortisone Acetate 1 % CREA Apply 15 g topically 2 (two) times daily. (Patient not taking: Reported on 11/26/2019) 15 g 3   No facility-administered medications prior to visit.    No Known Allergies  ROS Review of Systems  Constitutional: Negative.   HENT: Negative.   Eyes: Negative.   Respiratory: Negative.   Cardiovascular: Negative.   Gastrointestinal: Negative.   Endocrine: Negative.   Genitourinary: Negative.   Musculoskeletal: Positive for arthralgias (generalized. ).  Skin: Negative.   Allergic/Immunologic: Negative.   Neurological: Positive for dizziness (frequently) and headaches (occasional ).  Hematological: Negative.   Psychiatric/Behavioral: Negative.       Objective:    Physical Exam Vitals and nursing note reviewed.  Constitutional:      Appearance: Normal appearance.  HENT:     Head: Normocephalic and atraumatic.     Nose: Nose normal.     Mouth/Throat:     Mouth: Mucous membranes are moist.     Pharynx: Oropharynx is clear.  Cardiovascular:     Rate and Rhythm: Normal rate and regular rhythm.     Pulses: Normal pulses.     Heart sounds: Normal heart sounds.  Pulmonary:     Effort: Pulmonary effort is normal.     Breath sounds: Normal breath sounds.  Abdominal:     General: Bowel sounds are normal.     Palpations: Abdomen is soft.  Musculoskeletal:        General: Normal range of motion.     Cervical back: Normal range of motion and neck supple.  Skin:  General: Skin is warm and dry.  Neurological:     General: No focal deficit present.     Mental Status: He is alert and oriented to person, place, and time.  Psychiatric:        Mood and Affect: Mood normal.        Behavior: Behavior normal.        Thought Content: Thought content normal.        Judgment: Judgment normal.     BP (!) 126/53 (BP Location: Left Arm, Patient Position: Sitting)   Pulse 78   Ht 6\' 3"  (1.905 m)   Wt 211 lb (95.7 kg)   SpO2 100%   BMI 26.37 kg/m  Wt Readings from Last 3 Encounters:  11/26/19 211 lb (95.7 kg)  11/22/19 210 lb (95.3 kg)  11/16/19 211 lb 11.2 oz (96 kg)     Health Maintenance Due  Topic Date Due  . COVID-19 Vaccine (1) Never done  . FOOT EXAM  07/02/2017  . OPHTHALMOLOGY EXAM  07/11/2018    There are no preventive care reminders to display for this patient.  Lab Results  Component Value Date   TSH 0.299 (L) 11/16/2019   Lab Results  Component Value Date   WBC 10.8 (H) 11/16/2019   HGB 8.0 (L) 11/16/2019   HCT 24.9 (L) 11/16/2019   MCV 96.5 11/16/2019   PLT 420 (H) 11/16/2019   Lab Results  Component Value Date   NA 139 11/16/2019   K 4.0 11/16/2019   CO2 22 11/16/2019   GLUCOSE 248 (H) 11/16/2019   BUN 11 11/16/2019   CREATININE 1.40 (H) 11/16/2019   BILITOT 0.2 (L) 11/16/2019   ALKPHOS 71 11/16/2019   AST 33 11/16/2019   ALT 30 11/16/2019   PROT 8.5 (H) 11/16/2019   ALBUMIN 3.2 (L) 11/16/2019   CALCIUM 9.2 11/16/2019   ANIONGAP 13 11/16/2019   GFR 101.07 05/31/2019   Lab Results  Component Value Date   CHOL 102 07/22/2019   Lab Results  Component Value Date   HDL 34 (L) 07/22/2019   Lab Results  Component Value Date   LDLCALC 48 07/22/2019   Lab Results  Component Value Date   TRIG 106 07/22/2019   Lab Results  Component Value Date   CHOLHDL 3.0 07/22/2019   Lab Results  Component Value Date   HGBA1C 6.5 (A) 11/22/2019   HGBA1C 6.5 11/22/2019   HGBA1C 6.5 (A) 11/22/2019   HGBA1C 6.5  11/22/2019      Assessment & Plan:   1. Dizziness - meclizine (ANTIVERT) 25 MG tablet; Take 1 tablet (25 mg total) by mouth 3 (three) times daily as needed for dizziness.  Dispense: 90 tablet; Refill: 3  2. Metastatic non-small cell lung cancer ([place])  3. Encounter for antineoplastic chemotherapy Receiving chemotherapy treatments every 3 weeks.   4. Diabetes mellitus with peripheral vascular disease ([place]) He will continue medication as prescribed, to decrease foods/beverages high in sugars and carbs and follow Heart Healthy or DASH diet. Increase physical activity to at least 30 minutes cardio exercise daily.  - Glucose (CBG)  5. Follow up Keep follow up appointment.   Meds ordered this encounter  Medications  . meclizine (ANTIVERT) 25 MG tablet    Sig: Take 1 tablet (25 mg total) by mouth 3 (three) times daily as needed for dizziness.    Dispense:  90 tablet    Refill:  3    Orders Placed This Encounter  Procedures  . Glucose (  CBG)    Referral Orders  No referral(s) requested today   Kathe Becton,  MSN, FNP-BC [facility] [address] [place], [place] [address] [phone] fax  Problem List Items Addressed This Visit      Cardiovascular and Mediastinum   Diabetes mellitus with peripheral vascular disease ([place])   Relevant Orders   Glucose (CBG) (Completed)     Respiratory   Metastatic non-small cell lung cancer ([place])   Relevant Medications   meclizine (ANTIVERT) 25 MG tablet     Other   Encounter for antineoplastic chemotherapy    Other Visit Diagnoses    Dizziness    -  Primary   Relevant Medications   meclizine (ANTIVERT) 25 MG tablet   Follow up          Meds ordered this encounter  Medications  . meclizine (ANTIVERT) 25 MG tablet    Sig: Take 1 tablet (25 mg total) by mouth 3 (three) times daily as needed for dizziness.    Dispense:  90  tablet    Refill:  3    Follow-up: No follow-ups on file.    [name], FNP

## 2019-12-08 ENCOUNTER — Other Ambulatory Visit: Payer: Self-pay

## 2019-12-08 ENCOUNTER — Encounter: Payer: Self-pay | Admitting: Internal Medicine

## 2019-12-08 ENCOUNTER — Inpatient Hospital Stay: Payer: Medicare Other

## 2019-12-08 ENCOUNTER — Inpatient Hospital Stay: Payer: Medicare Other | Attending: Internal Medicine | Admitting: Internal Medicine

## 2019-12-08 ENCOUNTER — Ambulatory Visit (INDEPENDENT_AMBULATORY_CARE_PROVIDER_SITE_OTHER): Payer: Medicare Other | Admitting: Family Medicine

## 2019-12-08 VITALS — BP 117/52 | HR 91 | Temp 97.9°F | Resp 18 | Ht 75.0 in | Wt 212.9 lb

## 2019-12-08 DIAGNOSIS — Z09 Encounter for follow-up examination after completed treatment for conditions other than malignant neoplasm: Secondary | ICD-10-CM

## 2019-12-08 DIAGNOSIS — E1151 Type 2 diabetes mellitus with diabetic peripheral angiopathy without gangrene: Secondary | ICD-10-CM

## 2019-12-08 DIAGNOSIS — C3490 Malignant neoplasm of unspecified part of unspecified bronchus or lung: Secondary | ICD-10-CM

## 2019-12-08 DIAGNOSIS — I252 Old myocardial infarction: Secondary | ICD-10-CM | POA: Insufficient documentation

## 2019-12-08 DIAGNOSIS — Z5111 Encounter for antineoplastic chemotherapy: Secondary | ICD-10-CM | POA: Insufficient documentation

## 2019-12-08 DIAGNOSIS — Z79899 Other long term (current) drug therapy: Secondary | ICD-10-CM | POA: Diagnosis not present

## 2019-12-08 DIAGNOSIS — R7303 Prediabetes: Secondary | ICD-10-CM | POA: Diagnosis not present

## 2019-12-08 DIAGNOSIS — Z791 Long term (current) use of non-steroidal anti-inflammatories (NSAID): Secondary | ICD-10-CM | POA: Insufficient documentation

## 2019-12-08 DIAGNOSIS — R42 Dizziness and giddiness: Secondary | ICD-10-CM

## 2019-12-08 DIAGNOSIS — C3491 Malignant neoplasm of unspecified part of right bronchus or lung: Secondary | ICD-10-CM | POA: Insufficient documentation

## 2019-12-08 DIAGNOSIS — D6481 Anemia due to antineoplastic chemotherapy: Secondary | ICD-10-CM | POA: Insufficient documentation

## 2019-12-08 DIAGNOSIS — Z7982 Long term (current) use of aspirin: Secondary | ICD-10-CM | POA: Diagnosis not present

## 2019-12-08 DIAGNOSIS — E785 Hyperlipidemia, unspecified: Secondary | ICD-10-CM | POA: Insufficient documentation

## 2019-12-08 DIAGNOSIS — T451X5A Adverse effect of antineoplastic and immunosuppressive drugs, initial encounter: Secondary | ICD-10-CM | POA: Diagnosis not present

## 2019-12-08 DIAGNOSIS — I10 Essential (primary) hypertension: Secondary | ICD-10-CM | POA: Insufficient documentation

## 2019-12-08 DIAGNOSIS — R5383 Other fatigue: Secondary | ICD-10-CM | POA: Insufficient documentation

## 2019-12-08 DIAGNOSIS — I739 Peripheral vascular disease, unspecified: Secondary | ICD-10-CM | POA: Diagnosis not present

## 2019-12-08 DIAGNOSIS — R5382 Chronic fatigue, unspecified: Secondary | ICD-10-CM

## 2019-12-08 DIAGNOSIS — K5900 Constipation, unspecified: Secondary | ICD-10-CM | POA: Insufficient documentation

## 2019-12-08 DIAGNOSIS — E119 Type 2 diabetes mellitus without complications: Secondary | ICD-10-CM

## 2019-12-08 DIAGNOSIS — Z5112 Encounter for antineoplastic immunotherapy: Secondary | ICD-10-CM | POA: Diagnosis present

## 2019-12-08 DIAGNOSIS — I2510 Atherosclerotic heart disease of native coronary artery without angina pectoris: Secondary | ICD-10-CM | POA: Diagnosis not present

## 2019-12-08 DIAGNOSIS — Z7984 Long term (current) use of oral hypoglycemic drugs: Secondary | ICD-10-CM | POA: Diagnosis not present

## 2019-12-08 LAB — CBC WITH DIFFERENTIAL (CANCER CENTER ONLY)
Abs Immature Granulocytes: 0.02 10*3/uL (ref 0.00–0.07)
Basophils Absolute: 0.1 10*3/uL (ref 0.0–0.1)
Basophils Relative: 1 %
Eosinophils Absolute: 0.1 10*3/uL (ref 0.0–0.5)
Eosinophils Relative: 2 %
HCT: 28.7 % — ABNORMAL LOW (ref 39.0–52.0)
Hemoglobin: 8.8 g/dL — ABNORMAL LOW (ref 13.0–17.0)
Immature Granulocytes: 0 %
Lymphocytes Relative: 32 %
Lymphs Abs: 2.4 10*3/uL (ref 0.7–4.0)
MCH: 30.9 pg (ref 26.0–34.0)
MCHC: 30.7 g/dL (ref 30.0–36.0)
MCV: 100.7 fL — ABNORMAL HIGH (ref 80.0–100.0)
Monocytes Absolute: 0.9 10*3/uL (ref 0.1–1.0)
Monocytes Relative: 12 %
Neutro Abs: 3.9 10*3/uL (ref 1.7–7.7)
Neutrophils Relative %: 53 %
Platelet Count: 307 10*3/uL (ref 150–400)
RBC: 2.85 MIL/uL — ABNORMAL LOW (ref 4.22–5.81)
RDW: 16.1 % — ABNORMAL HIGH (ref 11.5–15.5)
WBC Count: 7.4 10*3/uL (ref 4.0–10.5)
nRBC: 0 % (ref 0.0–0.2)

## 2019-12-08 LAB — CMP (CANCER CENTER ONLY)
ALT: 32 U/L (ref 0–44)
AST: 38 U/L (ref 15–41)
Albumin: 3.5 g/dL (ref 3.5–5.0)
Alkaline Phosphatase: 75 U/L (ref 38–126)
Anion gap: 11 (ref 5–15)
BUN: 19 mg/dL (ref 8–23)
CO2: 23 mmol/L (ref 22–32)
Calcium: 9.7 mg/dL (ref 8.9–10.3)
Chloride: 105 mmol/L (ref 98–111)
Creatinine: 1.42 mg/dL — ABNORMAL HIGH (ref 0.61–1.24)
GFR, Est AFR Am: 60 mL/min — ABNORMAL LOW (ref >60)
GFR, Estimated: 51 mL/min — ABNORMAL LOW (ref >60)
Glucose, Bld: 180 mg/dL — ABNORMAL HIGH (ref 70–99)
Potassium: 4.6 mmol/L (ref 3.5–5.1)
Sodium: 139 mmol/L (ref 135–145)
Total Bilirubin: 0.2 mg/dL — ABNORMAL LOW (ref 0.3–1.2)
Total Protein: 8.4 g/dL — ABNORMAL HIGH (ref 6.5–8.1)

## 2019-12-08 LAB — TSH: TSH: 0.616 u[IU]/mL (ref 0.320–4.118)

## 2019-12-08 MED ORDER — PROCHLORPERAZINE MALEATE 10 MG PO TABS
10.0000 mg | ORAL_TABLET | Freq: Once | ORAL | Status: AC
  Administered 2019-12-08: 10 mg via ORAL

## 2019-12-08 MED ORDER — HEPARIN SOD (PORK) LOCK FLUSH 100 UNIT/ML IV SOLN
500.0000 [IU] | Freq: Once | INTRAVENOUS | Status: AC | PRN
  Administered 2019-12-08: 500 [IU]
  Filled 2019-12-08: qty 5

## 2019-12-08 MED ORDER — CYANOCOBALAMIN 1000 MCG/ML IJ SOLN
INTRAMUSCULAR | Status: AC
  Filled 2019-12-08: qty 1

## 2019-12-08 MED ORDER — SODIUM CHLORIDE 0.9 % IV SOLN
200.0000 mg | Freq: Once | INTRAVENOUS | Status: AC
  Administered 2019-12-08: 200 mg via INTRAVENOUS
  Filled 2019-12-08: qty 8

## 2019-12-08 MED ORDER — SODIUM CHLORIDE 0.9% FLUSH
10.0000 mL | INTRAVENOUS | Status: DC | PRN
  Administered 2019-12-08: 10 mL
  Filled 2019-12-08: qty 10

## 2019-12-08 MED ORDER — PROCHLORPERAZINE MALEATE 10 MG PO TABS
ORAL_TABLET | ORAL | Status: AC
  Filled 2019-12-08: qty 1

## 2019-12-08 MED ORDER — SODIUM CHLORIDE 0.9 % IV SOLN
500.0000 mg/m2 | Freq: Once | INTRAVENOUS | Status: AC
  Administered 2019-12-08: 1100 mg via INTRAVENOUS
  Filled 2019-12-08: qty 40

## 2019-12-08 MED ORDER — CYANOCOBALAMIN 1000 MCG/ML IJ SOLN
1000.0000 ug | Freq: Once | INTRAMUSCULAR | Status: AC
  Administered 2019-12-08: 1000 ug via INTRAMUSCULAR

## 2019-12-08 MED ORDER — SODIUM CHLORIDE 0.9 % IV SOLN
Freq: Once | INTRAVENOUS | Status: AC
  Filled 2019-12-08: qty 250

## 2019-12-08 NOTE — Progress Notes (Signed)
[facility] [phone]   [phone]  OFFICE PROGRESS NOTE  [name], FNP [name] [address]  DIAGNOSIS: Stage IV (TX, N3, M1c) non-small cell lung cancer, poorly differentiated adenocarcinoma presented with bulky mediastinal lymphadenopathy in addition to right axillary lymphadenopathy and metastatic liver lesions, abdominal lymphadenopathy diagnosed in January 2021. Molecular studies by [employer] 360 that shows no actionable mutations.  PRIOR THERAPY: None  CURRENT THERAPY: Systemic chemotherapy with carboplatin for AUC of 5, Alimta 500 mg/M2 and Keytruda 200 mg IV every 3 weeks.  First dose July 14, 2019.  Status post 7 cycles. Starting from cycle #5 the patient will be treated with maintenance Alimta and Keytruda every 3 weeks.  INTERVAL HISTORY: Kirk [name] 64 y.o. male returns to the clinic today for follow-up visit.  The patient is feeling fine today with no concerning complaints except for intermittent dizzy spells especially with the change in position.  The patient has persistent anemia but he is Jehov[employer]'s Witness and he refused blood transfusion in the past.  He denied having any chest pain, shortness breath, cough or hemoptysis.  He denied having any fever or chills.  He has no nausea, vomiting, diarrhea or constipation.  He denied having any headache or visual changes.  He is here today for evaluation before starting cycle #8 of his treatment.   MEDICAL HISTORY: Past Medical History:  Diagnosis Date  . Arthritis    "right knee" (09/04/2015)  . Atrial flutter with rapid ventricular response ([facility]) 04/19/2019  . Dizziness 10/2019  . Dyspnea    increased exertion  . HTN (hypertension)   . Hyperlipidemia   . Lung cancer ([facility]) 05/2019  . NSTEMI (non-ST elevated myocardial infarction) ([facility]) 09/04/2015   a. cath 09/05/2015: 95% stenosis mid-LAD, 55% mid-RCA, and 40% prox-RCA. PCI performed w/ Synergy DES  to LAD  . PAD (peripheral artery disease) ([facility])    Stenting of bilateral iliacs in 2003.  [name] Refusal of blood transfusions as patient is Jehovah's Witness   . Type II diabetes mellitus ([facility])     ALLERGIES:  has No Known Allergies.  MEDICATIONS:  Current Outpatient Medications  Medication Sig Dispense Refill  . acetaminophen (TYLENOL) 500 MG tablet Take 500 mg by mouth every 6 (six) hours as needed for fever.    [name] aspirin EC 81 MG tablet Take 81 mg by mouth at bedtime.    . carvedilol (COREG) 12.5 MG tablet Take 1 tablet (12.5 mg total) by mouth 2 (two) times daily with a meal. 180 tablet 3  . cetirizine (ZYRTEC) 10 MG tablet TAKE 1 TABLET BY MOUTH DAILY 30 tablet 0  . clopidogrel (PLAVIX) 75 MG tablet Take 1 tablet (75 mg total) by mouth daily. 90 tablet 3  . docusate sodium (COLACE) 100 MG capsule Take 1 capsule (100 mg total) by mouth 2 (two) times daily as needed for mild constipation. 60 capsule 11  . ferrous sulfate 324 MG TBEC Take 1 tablet (324 mg total) by mouth daily with breakfast. 30 tablet 11  . folic acid (FOLVITE) 1 MG tablet TAKE 1 TABLET BY MOUTH EVERY DAY 90 tablet 1  . glimepiride (AMARYL) 4 MG tablet TAKE 1 TABLET BY MOUTH EVERY DAY 90 tablet 1  . HYDROcodone-homatropine (HYCODAN) 5-1.5 MG/5ML syrup Take 5 mLs by mouth every 8 (eight) hours as needed for cough. 120 mL 0  . Hydrocortisone Acetate 1 % CREA Apply 15 g topically 2 (two) times  daily. (Patient not taking: Reported on 11/26/2019) 15 g 3  . ibuprofen (ADVIL) 200 MG tablet Take 600 mg by mouth once.    [name] ipratropium (ATROVENT) 0.03 % nasal spray USE 2 SPRAYS IN EACH NOSTRIL TWICE DAILY 30 mL 2  . lactulose (CHRONULAC) 10 GM/15ML solution Take 30 mLs (20 g total) by mouth 2 (two) times daily as needed for moderate constipation. 240 mL 11  . lidocaine-prilocaine (EMLA) cream APPLY TO THE PORT-A-CATH SITE 30-60 MINUTES BEFORE TREATMENT. 30 g 0  . lisinopril (ZESTRIL) 10 MG tablet TAKE 1 TABLET BY MOUTH EVERY DAY 90  tablet 1  . meclizine (ANTIVERT) 25 MG tablet Take 1 tablet (25 mg total) by mouth 3 (three) times daily as needed for dizziness. 90 tablet 3  . metFORMIN (GLUCOPHAGE) 1000 MG tablet TAKE 1 TABLET (1,000 MG TOTAL) BY MOUTH 2 (TWO) TIMES DAILY WITH A MEAL. 180 tablet 1  . multivitamin (ONE-A-DAY MEN'S) TABS tablet Take 1 tablet by mouth daily. 30 tablet 6  . nitroGLYCERIN (NITROSTAT) 0.4 MG SL tablet Place 1 tablet (0.4 mg total) under the tongue every 5 (five) minutes x 3 doses as needed for chest pain. 25 tablet 2  . oxyCODONE-acetaminophen (PERCOCET/ROXICET) 5-325 MG tablet Take 1 tablet by mouth every 6 (six) hours as needed for severe pain. 30 tablet 0  . pantoprazole (PROTONIX) 40 MG tablet TAKE 1 TABLET BY MOUTH EVERY DAY 30 tablet 5  . prochlorperazine (COMPAZINE) 10 MG tablet Take 1 tablet (10 mg total) by mouth every 6 (six) hours as needed for nausea or vomiting. 30 tablet 1  . rosuvastatin (CRESTOR) 40 MG tablet TAKE 1 TABLET BY MOUTH DAILY 90 tablet 3  . sucralfate (CARAFATE) 1 GM/10ML suspension Take 10 mLs (1 g total) by mouth 4 (four) times daily. 420 mL 1   No current facility-administered medications for this visit.    SURGICAL HISTORY:  Past Surgical History:  Procedure Laterality Date  . CARDIAC CATHETERIZATION N/A 09/05/2015   Procedure: Left Heart Cath and Coronary Angiography;  Surgeon: Troy Sine, MD;  Location: [facility];  Service: Cardiovascular;  Laterality: N/A;  . CARDIAC CATHETERIZATION N/A 09/05/2015   Procedure: Coronary Stent Intervention;  Surgeon: Troy Sine, MD;  Location: [facility];  Service: Cardiovascular;  Laterality: N/A;  . IR IMAGING GUIDED PORT INSERTION  07/15/2019  . 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 PERIPHERAL VASCULAR CATHETERIZATION Bilateral 2003   "stenting"  . TONSILLECTOMY  ~ 1963    REVIEW OF SYSTEMS:  A comprehensive review of systems was negative except for: Constitutional:  positive for fatigue Neurological: positive for dizziness   PHYSICAL EXAMINATION: General appearance: alert, cooperative, appears stated age, fatigued and no distress Head: Normocephalic, without obvious abnormality, atraumatic Neck: no adenopathy, no JVD, supple, symmetrical, trachea midline and thyroid not enlarged, symmetric, no tenderness/mass/nodules Lymph nodes: Cervical, supraclavicular, and axillary nodes normal. Resp: clear to auscultation bilaterally Back: symmetric, no curvature. ROM normal. No CVA tenderness. Cardio: regular rate and rhythm, S1, S2 normal, no murmur, click, rub or gallop GI: soft, non-tender; bowel sounds normal; no masses,  no organomegaly Extremities: extremities normal, atraumatic, no cyanosis or edema  ECOG PERFORMANCE STATUS: 1 - Symptomatic but completely ambulatory  Blood pressure (!) 117/52, pulse 91, temperature 97.9 F (36.6 C), temperature source Temporal, resp. rate 18, height 6\' 3"  (1.905 m), weight 212 lb 14.4 oz (96.6 kg), SpO2 100 %.  LABORATORY DATA:  Lab Results  Component Value Date   WBC 7.4 12/08/2019   HGB 8.8 (L) 12/08/2019   HCT 28.7 (L) 12/08/2019   MCV 100.7 (H) 12/08/2019   PLT 307 12/08/2019      Chemistry      Component Value Date/Time   NA 139 11/16/2019 1258   NA 140 06/08/2015 0840   K 4.0 11/16/2019 1258   CL 104 11/16/2019 1258   CO2 22 11/16/2019 1258   BUN 11 11/16/2019 1258   BUN 15 06/08/2015 0840   CREATININE 1.40 (H) 11/16/2019 1258   CREATININE 1.06 10/01/2016 0855      Component Value Date/Time   CALCIUM 9.2 11/16/2019 1258   ALKPHOS 71 11/16/2019 1258   AST 33 11/16/2019 1258   ALT 30 11/16/2019 1258   BILITOT 0.2 (L) 11/16/2019 1258       RADIOGRAPHIC STUDIES: CT Chest W Contrast  Result Date: 11/15/2019 CLINICAL DATA:  Non-small cell lung cancer. EXAM: CT CHEST, ABDOMEN, AND PELVIS WITH CONTRAST TECHNIQUE: Multidetector CT imaging of the chest, abdomen and pelvis was performed following the  standard protocol during bolus administration of intravenous contrast. CONTRAST:  130mL OMNIPAQUE IOHEXOL 300 MG/ML  SOLN COMPARISON:  09/13/2019. FINDINGS: CT CHEST FINDINGS Cardiovascular: Right IJ Port-A-Cath terminates in the high right atrium. Atherosclerotic calcification of the aorta and coronary arteries. Pulmonic trunk is enlarged. Heart size normal. No pericardial effusion. Mediastinum/Nodes: Subcarinal lymph node measures 1.8 cm, stable. Additional mediastinal lymph nodes measure up to 9 mm in the low right paratracheal station. No hilar adenopathy. Axillary lymph nodes measure up to 12 mm on the right, stable. Periesophageal lymph nodes are not enlarged by CT size criteria and appear stable. Esophagus is grossly unremarkable. Lungs/Pleura: Centrilobular emphysema. Peribronchovascular scarring in the lateral segment right middle lobe. Lungs are otherwise clear. No pleural fluid. Airway is unremarkable. Musculoskeletal: Degenerative changes in the spine. No worrisome lytic or sclerotic lesions. CT ABDOMEN PELVIS FINDINGS Hepatobiliary: Ill-defined lesion in segment 4 of the liver has decreased in size slightly now measuring 11 mm (2/51), compared to 2.0 cm on 09/13/2019. 7 mm low-attenuation lesion in the peripheral dome of the right hepatic lobe has also decreased in size, now measuring 7 mm, compared to 10 mm previously. A low-attenuation lesion with peripheral contrast puddling in the inferior right hepatic lobe measures 2.0 cm, is stable from 09/13/2019 and is not visualized on delayed nephrographic phase imaging, indicative of a hemangioma. Liver and gallbladder are otherwise unremarkable. No biliary ductal dilatation. Pancreas: Negative. Spleen: Negative. Adrenals/Urinary Tract: Adrenal glands are unremarkable. There may be a slightly striated appearance of the kidneys on nephrographic phase imaging. Kidneys are otherwise unremarkable. Ureters are decompressed. Stomach/Bowel: Stomach, small bowel,  appendix and colon are unremarkable. Vascular/Lymphatic: Atherosclerotic calcification of the aorta without aneurysm. Bilateral common iliac stents. Gastrohepatic ligament and periportal adenopathy is unchanged. Index periportal lymph node measures 1.6 cm (2/58). Small lymph nodes are seen throughout the retroperitoneum and small bowel mesentery, similar. Reproductive: Prostate is visualized. Other: No free fluid. Mesenteries and peritoneum are otherwise unremarkable. Musculoskeletal: Degenerative changes in the spine. No worrisome lytic or sclerotic lesions. Two pins are seen in the proximal left femur. Degenerative changes in the right hip. IMPRESSION: 1. Continued regression of hepatic metastases. Stable borderline to slightly enlarged lymph nodes in the chest, abdomen and pelvis. 2. Slightly striated appearance of the kidneys on nephrographic phase imaging, of questionable clinical significance. Consider correlation with urinalysis. 3. Aortic atherosclerosis (ICD10-I70.0). Coronary artery calcification. 4. Enlarged pulmonic trunk,  indicative of pulmonary arterial hypertension. 5.  Emphysema (ICD10-J43.9). Electronically Signed   By: Lorin Picket M.D.   On: 11/15/2019 12:10   CT Abdomen Pelvis W Contrast  Result Date: 11/15/2019 CLINICAL DATA:  Non-small cell lung cancer. EXAM: CT CHEST, ABDOMEN, AND PELVIS WITH CONTRAST TECHNIQUE: Multidetector CT imaging of the chest, abdomen and pelvis was performed following the standard protocol during bolus administration of intravenous contrast. CONTRAST:  153mL OMNIPAQUE IOHEXOL 300 MG/ML  SOLN COMPARISON:  09/13/2019. FINDINGS: CT CHEST FINDINGS Cardiovascular: Right IJ Port-A-Cath terminates in the high right atrium. Atherosclerotic calcification of the aorta and coronary arteries. Pulmonic trunk is enlarged. Heart size normal. No pericardial effusion. Mediastinum/Nodes: Subcarinal lymph node measures 1.8 cm, stable. Additional mediastinal lymph nodes measure up  to 9 mm in the low right paratracheal station. No hilar adenopathy. Axillary lymph nodes measure up to 12 mm on the right, stable. Periesophageal lymph nodes are not enlarged by CT size criteria and appear stable. Esophagus is grossly unremarkable. Lungs/Pleura: Centrilobular emphysema. Peribronchovascular scarring in the lateral segment right middle lobe. Lungs are otherwise clear. No pleural fluid. Airway is unremarkable. Musculoskeletal: Degenerative changes in the spine. No worrisome lytic or sclerotic lesions. CT ABDOMEN PELVIS FINDINGS Hepatobiliary: Ill-defined lesion in segment 4 of the liver has decreased in size slightly now measuring 11 mm (2/51), compared to 2.0 cm on 09/13/2019. 7 mm low-attenuation lesion in the peripheral dome of the right hepatic lobe has also decreased in size, now measuring 7 mm, compared to 10 mm previously. A low-attenuation lesion with peripheral contrast puddling in the inferior right hepatic lobe measures 2.0 cm, is stable from 09/13/2019 and is not visualized on delayed nephrographic phase imaging, indicative of a hemangioma. Liver and gallbladder are otherwise unremarkable. No biliary ductal dilatation. Pancreas: Negative. Spleen: Negative. Adrenals/Urinary Tract: Adrenal glands are unremarkable. There may be a slightly striated appearance of the kidneys on nephrographic phase imaging. Kidneys are otherwise unremarkable. Ureters are decompressed. Stomach/Bowel: Stomach, small bowel, appendix and colon are unremarkable. Vascular/Lymphatic: Atherosclerotic calcification of the aorta without aneurysm. Bilateral common iliac stents. Gastrohepatic ligament and periportal adenopathy is unchanged. Index periportal lymph node measures 1.6 cm (2/58). Small lymph nodes are seen throughout the retroperitoneum and small bowel mesentery, similar. Reproductive: Prostate is visualized. Other: No free fluid. Mesenteries and peritoneum are otherwise unremarkable. Musculoskeletal:  Degenerative changes in the spine. No worrisome lytic or sclerotic lesions. Two pins are seen in the proximal left femur. Degenerative changes in the right hip. IMPRESSION: 1. Continued regression of hepatic metastases. Stable borderline to slightly enlarged lymph nodes in the chest, abdomen and pelvis. 2. Slightly striated appearance of the kidneys on nephrographic phase imaging, of questionable clinical significance. Consider correlation with urinalysis. 3. Aortic atherosclerosis (ICD10-I70.0). Coronary artery calcification. 4. Enlarged pulmonic trunk, indicative of pulmonary arterial hypertension. 5.  Emphysema (ICD10-J43.9). Electronically Signed   By: Lorin Picket M.D.   On: 11/15/2019 12:10    ASSESSMENT AND PLAN: This is a very pleasant 64 years old African-American male recently diagnosed with a stage IV (TX, N3, M1c) non-small cell lung cancer, poorly differentiated adenocarcinoma presented with bulky mediastinal as well as right hilar, supraclavicular, abdominal and right axillary lymphadenopathy in addition to metastatic liver lesions diagnosed in January 2021. Molecular studies by guardant 360 shows no actionable mutations.  The patient is not a candidate for treatment with targeted therapy or enrollment in the clinical trial with the [facility] regimen. The patient is currently undergoing systemic chemotherapy with carboplatin for AUC of 5,  Alimta 500 mg/M2 and Keytruda 200 mg IV every 3 weeks status post 7 cycles. Starting from cycle #5 he is treated with maintenance Alimta and Keytruda every 3 weeks. The patient continues to tolerate his treatment well with no concerning complaints except for the fatigue and the intermittent dizzy spells from the anemia. I recommended for him to proceed with cycle #8 today as planned. I will see him back for follow-up visit in 3 weeks for evaluation before the next cycle of his treatment. For the chemotherapy-induced anemia, the patient was advised to take  oral iron tablets at regular basis.  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 For pain management he will continue on Percocet on as-needed basis. For constipation the patient is currently on treatment with MiraLAX and milk of magnesia as needed. The patient was advised to call immediately if he has any concerning symptoms in the interval. The patient voices understanding of current disease status and treatment options and is in agreement with the current care plan.  All questions were answered. The patient knows to call the clinic with any problems, questions or concerns. We can certainly see the patient much sooner if necessary.  Disclaimer: This note was dictated with voice recognition software. Similar sounding words can inadvertently be transcribed and may not be corrected upon review.

## 2019-12-08 NOTE — Progress Notes (Signed)
Virtual Visit via Telephone Note  I connected with Kirk [name] on 12/08/19 at  8:40 AM EDT by telephone and verified that I am speaking with the correct person using two identifiers.   I discussed the limitations, risks, security and privacy concerns of performing an evaluation and management service by telephone and the availability of in person appointments. I also discussed with the patient that there may be a patient responsible charge related to this service. The patient expressed understanding and agreed to proceed.   Televisit Today Patient Location: Home Provider Location: Office  History of Present Illness:  Past Surgical History:  Procedure Laterality Date  . CARDIAC CATHETERIZATION N/A 09/05/2015   Procedure: Left Heart Cath and Coronary Angiography;  Surgeon: Troy Sine, MD;  Location: [facility];  Service: Cardiovascular;  Laterality: N/A;  . CARDIAC CATHETERIZATION N/A 09/05/2015   Procedure: Coronary Stent Intervention;  Surgeon: Troy Sine, MD;  Location: [facility];  Service: Cardiovascular;  Laterality: N/A;  . IR IMAGING GUIDED PORT INSERTION  07/15/2019  . 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 PERIPHERAL VASCULAR CATHETERIZATION Bilateral 2003   "stenting"  . TONSILLECTOMY  ~ 1963    Social History   Socioeconomic History  . Marital status: Married    Spouse name: Not on file  . Number of children: 1  . Years of education: Not on file  . Highest education level: Not on file  Occupational History  . Occupation: Acupuncturist  Tobacco Use  . Smoking status: Former Smoker    Packs/day: 2.00    Years: 35.00    Pack years: 70.00    Types: Cigarettes    Quit date: 02/19/2003    Years since quitting: 16.8  . Smokeless tobacco: Never Used  Vaping Use  . Vaping Use: Never used  Substance and Sexual Activity  . Alcohol use: Not Currently    Alcohol/week: 0.0 standard drinks    Comment: 09/04/2015  "nothing since 1980s"  . 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 Sexual activity: Not Currently  Other Topics Concern  . Not on file  Social History Narrative  . Not on file   Social Determinants of Health   Financial Resource Strain:   . Difficulty of Paying Living Expenses:   Food Insecurity:   . Worried About Charity fundraiser in the Last Year:   . Arboriculturist in the Last Year:   Transportation Needs:   . Film/video editor (Medical):   [name] Lack of Transportation (Non-Medical):   Physical Activity:   . Days of Exercise per Week:   . Minutes of Exercise per Session:   Stress:   . Feeling of Stress :   Social Connections:   . Frequency of Communication with Friends and Family:   . Frequency of Social Gatherings with Friends and Family:   . Attends Religious Services:   . Active Member of Clubs or Organizations:   . Attends Archivist Meetings:   [name] Marital Status:   Intimate Partner Violence:   . Fear of Current or Ex-Partner:   . Emotionally Abused:   [name] Physically Abused:   . Sexually Abused:    Family History  Problem Relation Age of Onset  . Lymphoma Mother   . Cancer - Prostate Father   . CAD Neg Hx   .  Colon cancer Neg Hx   . Rectal cancer Neg Hx   . Stomach cancer Neg Hx   . Esophageal cancer Neg Hx     Past Medical History:  Diagnosis Date  . Arthritis    "right knee" (09/04/2015)  . Atrial flutter with rapid ventricular response ([place]) 04/19/2019  . Dizziness 10/2019  . Dyspnea    increased exertion  . HTN (hypertension)   . Hyperlipidemia   . Lung cancer ([place]) 05/2019  . NSTEMI (non-ST elevated myocardial infarction) ([place]) 09/04/2015   a. cath 09/05/2015: 95% stenosis mid-LAD, 55% mid-RCA, and 40% prox-RCA. PCI performed w/ Synergy DES to LAD  . PAD (peripheral artery disease) ([facility])    Stenting of bilateral iliacs in 2003.  [name] Refusal of blood transfusions as patient is Jehovah's Witness   .  Type II diabetes mellitus ([place])     Current Outpatient Medications on File Prior to Visit  Medication Sig Dispense Refill  . acetaminophen (TYLENOL) 500 MG tablet Take 500 mg by mouth every 6 (six) hours as needed for fever.    [name] aspirin EC 81 MG tablet Take 81 mg by mouth at bedtime.    . carvedilol (COREG) 12.5 MG tablet Take 1 tablet (12.5 mg total) by mouth 2 (two) times daily with a meal. 180 tablet 3  . cetirizine (ZYRTEC) 10 MG tablet TAKE 1 TABLET BY MOUTH DAILY 30 tablet 0  . clopidogrel (PLAVIX) 75 MG tablet Take 1 tablet (75 mg total) by mouth daily. 90 tablet 3  . docusate sodium (COLACE) 100 MG capsule Take 1 capsule (100 mg total) by mouth 2 (two) times daily as needed for mild constipation. 60 capsule 11  . ferrous sulfate 324 MG TBEC Take 1 tablet (324 mg total) by mouth daily with breakfast. 30 tablet 11  . folic acid (FOLVITE) 1 MG tablet TAKE 1 TABLET BY MOUTH EVERY DAY 90 tablet 1  . glimepiride (AMARYL) 4 MG tablet TAKE 1 TABLET BY MOUTH EVERY DAY 90 tablet 1  . HYDROcodone-homatropine (HYCODAN) 5-1.5 MG/5ML syrup Take 5 mLs by mouth every 8 (eight) hours as needed for cough. 120 mL 0  . Hydrocortisone Acetate 1 % CREA Apply 15 g topically 2 (two) times daily. (Patient not taking: Reported on 11/26/2019) 15 g 3  . ibuprofen (ADVIL) 200 MG tablet Take 600 mg by mouth once.    [name] ipratropium (ATROVENT) 0.03 % nasal spray USE 2 SPRAYS IN EACH NOSTRIL TWICE DAILY 30 mL 2  . lactulose (CHRONULAC) 10 GM/15ML solution Take 30 mLs (20 g total) by mouth 2 (two) times daily as needed for moderate constipation. 240 mL 11  . lidocaine-prilocaine (EMLA) cream APPLY TO THE PORT-A-CATH SITE 30-60 MINUTES BEFORE TREATMENT. 30 g 0  . lisinopril (ZESTRIL) 10 MG tablet TAKE 1 TABLET BY MOUTH EVERY DAY 90 tablet 1  . meclizine (ANTIVERT) 25 MG tablet Take 1 tablet (25 mg total) by mouth 3 (three) times daily as needed for dizziness. 90 tablet 3  . metFORMIN (GLUCOPHAGE) 1000 MG tablet TAKE 1  TABLET (1,000 MG TOTAL) BY MOUTH 2 (TWO) TIMES DAILY WITH A MEAL. 180 tablet 1  . multivitamin (ONE-A-DAY MEN'S) TABS tablet Take 1 tablet by mouth daily. 30 tablet 6  . nitroGLYCERIN (NITROSTAT) 0.4 MG SL tablet Place 1 tablet (0.4 mg total) under the tongue every 5 (five) minutes x 3 doses as needed for chest pain. 25 tablet 2  . oxyCODONE-acetaminophen (PERCOCET/ROXICET) 5-325 MG tablet Take 1 tablet by mouth every  6 (six) hours as needed for severe pain. 30 tablet 0  . pantoprazole (PROTONIX) 40 MG tablet TAKE 1 TABLET BY MOUTH EVERY DAY 30 tablet 5  . prochlorperazine (COMPAZINE) 10 MG tablet Take 1 tablet (10 mg total) by mouth every 6 (six) hours as needed for nausea or vomiting. 30 tablet 1  . rosuvastatin (CRESTOR) 40 MG tablet TAKE 1 TABLET BY MOUTH DAILY 90 tablet 3  . sucralfate (CARAFATE) 1 GM/10ML suspension Take 10 mLs (1 g total) by mouth 4 (four) times daily. 420 mL 1   No current facility-administered medications on file prior to visit.    No Known Allergies   Patient Active Problem List   Diagnosis Date Noted  . Metastatic non-small cell lung cancer ([place]) 07/07/2019  . Encounter for antineoplastic chemotherapy 07/07/2019  . Encounter for antineoplastic immunotherapy 07/07/2019  . Goals of care, counseling/discussion 06/28/2019  . Adenocarcinoma of right lung, stage 4 ([place]) 06/28/2019  . Cancer associated pain 06/28/2019  . CAD (coronary artery disease) 06/08/2019  . Mediastinal lymphadenopathy 06/03/2019  . Liver metastasis ([place]) 06/03/2019  . Hemoglobin A1C between 7% and 9% indicating borderline diabetic control 05/10/2019  . COVID-19 virus infection 05/10/2019  . Atrial flutter with rapid ventricular response ([place]) 04/19/2019  . Chest pain with moderate risk for cardiac etiology 04/19/2019  . Paroxysmal atrial flutter ([place]) 04/19/2019  . Chest congestion 07/10/2018  . Cough 07/10/2018  . NSTEMI (non-ST elevated myocardial infarction) ([place]) 09/04/2015  . PVD  (peripheral vascular disease) ([place]) 06/08/2015  . DDD (degenerative disc disease), cervical 06/08/2015  . Hyperlipidemia 06/08/2015  . Diabetes mellitus with nephropathy ([place]) 06/08/2015  . Diabetes mellitus with peripheral vascular disease ([place]) 06/08/2015  . Annual physical exam 01/03/2015  . PAD (peripheral artery disease) ([place]) 02/18/2013  . Diabetes ([place]) 02/18/2013  . HTN (hypertension) 02/18/2013   Current Status: Since his last office visit, he had occasional  fatigue and nausea. He continues to follow up with chemotherapy treatments every 3 weeks for Lung Cancer. He reports that his dizzy spells have subsided significantly since beginning Meclizine. He reports increase fatigue, especially a few days after chemotherapy treatment. He is tolerating treatment fairly. He denies visual changes, chest pain, cough, shortness of breath, heart palpitations, and falls. He has occasional headaches and dizziness with position changes. Denies severe headaches, confusion, seizures, double vision, and blurred vision, and vomiting. His blood glucose levels have been stable. He denies frequent urination, blurred vision, excessive hunger, excessive thirst, weight gain, weight loss, and poor wound healing. He continues to check his feet regularly. He denies fevers, chills, recent infections, weight loss, and night sweats. Denies GI problems such as diarrhea, and constipation. He has no reports of blood in stools, dysuria and hematuria. No depression or anxiety, and denies suicidal ideations, homicidal ideations, or auditory hallucinations. He is taking all medications as prescribed. He denies pain today.    Observations/Objective:  Telephone Visit  Assessment and Plan:  1. Dizziness Stable today. Continue Meclizine as needed. Monitor.   2. Metastatic non-small cell lung cancer ([place]) Continue treatments every 3 weeks.   3. Diabetes mellitus with peripheral vascular disease ([place]) He will continue  medication as prescribed, to decrease foods/beverages high in sugars and carbs and follow Heart Healthy or DASH diet. Increase physical activity to at least 30 minutes cardio exercise daily.   4. Hemoglobin A1C between 7% and 9% indicating borderline diabetic control Improved. Hgb A1c is stable at 6.5 on 11/22/2019, from 8.2 on 04/18/2020. Monitor.  5. Essential hypertension She will continue to take medications as prescribed, to decrease high sodium intake, excessive alcohol intake, increase potassium intake, smoking cessation, and increase physical activity of at least 30 minutes of cardio activity daily. She will continue to follow Heart Healthy or DASH diet.  6. Follow up Keep follow up appointment.    No orders of the defined types were placed in this encounter.   No orders of the defined types were placed in this encounter.   Referral Orders  No referral(s) requested today    Kathe Becton,  MSN, FNP-BC [facility] [address] [place], [place] [address] [phone] fax    Follow Up Instructions:    I discussed the assessment and treatment plan with the patient. The patient was provided an opportunity to ask questions and all were answered. The patient agreed with the plan and demonstrated an understanding of the instructions.   The patient was advised to call back or seek an in-person evaluation if the symptoms worsen or if the condition fails to improve as anticipated.  I provided 15 minutes of non-face-to-face time during this encounter.   [name], FNP

## 2019-12-08 NOTE — Patient Instructions (Signed)
[facility] Discharge Instructions for Patients Receiving Chemotherapy  Today you received the following chemotherapy agents: Keytruda and Alimta  To help prevent nausea and vomiting after your treatment, we encourage you to take your nausea medication as prescribed.    If you develop nausea and vomiting that is not controlled by your nausea medication, call the clinic.   BELOW ARE SYMPTOMS THAT SHOULD BE REPORTED IMMEDIATELY:  *FEVER GREATER THAN 100.5 F  *CHILLS WITH OR WITHOUT FEVER  NAUSEA AND VOMITING THAT IS NOT CONTROLLED WITH YOUR NAUSEA MEDICATION  *UNUSUAL SHORTNESS OF BREATH  *UNUSUAL BRUISING OR BLEEDING  TENDERNESS IN MOUTH AND THROAT WITH OR WITHOUT PRESENCE OF ULCERS  *URINARY PROBLEMS  *BOWEL PROBLEMS  UNUSUAL RASH Items with * indicate a potential emergency and should be followed up as soon as possible.  Feel free to call the clinic should you have any questions or concerns. The clinic phone number is [phone].  Please show the [facility] at check-in to the Emergency Department and triage nurse.

## 2019-12-13 ENCOUNTER — Other Ambulatory Visit: Payer: Self-pay | Admitting: Family Medicine

## 2019-12-13 DIAGNOSIS — J302 Other seasonal allergic rhinitis: Secondary | ICD-10-CM

## 2019-12-14 ENCOUNTER — Telehealth: Payer: Self-pay | Admitting: Internal Medicine

## 2019-12-14 NOTE — Telephone Encounter (Signed)
Scheduled per los. Called and spoke with patient. Confirmed appt 

## 2019-12-24 ENCOUNTER — Telehealth: Payer: Self-pay | Admitting: Family Medicine

## 2019-12-24 ENCOUNTER — Telehealth: Payer: Self-pay | Admitting: *Deleted

## 2019-12-24 NOTE — Telephone Encounter (Signed)
Pt called stating he has been having dizziness and vomiting after standing and moving. Advised pt to take prescribed antiametics as well as his prescribed meclizine. Also to push fluids, check bp sitting and standing, and rise slowly when getting up. Pt stated that he will call his PCP to make an appt for Monday. Advised to call office with any other concerns. Pt verbalized understanding

## 2019-12-25 ENCOUNTER — Emergency Department (HOSPITAL_COMMUNITY)
Admission: EM | Admit: 2019-12-25 | Discharge: 2019-12-25 | Disposition: A | Discharge status: Home / Self Care | Payer: Medicare Other | Attending: Emergency Medicine | Admitting: Emergency Medicine

## 2019-12-25 ENCOUNTER — Emergency Department (HOSPITAL_COMMUNITY): Payer: Medicare Other

## 2019-12-25 ENCOUNTER — Other Ambulatory Visit: Payer: Self-pay

## 2019-12-25 DIAGNOSIS — E1121 Type 2 diabetes mellitus with diabetic nephropathy: Secondary | ICD-10-CM | POA: Diagnosis not present

## 2019-12-25 DIAGNOSIS — Z79899 Other long term (current) drug therapy: Secondary | ICD-10-CM | POA: Diagnosis not present

## 2019-12-25 DIAGNOSIS — Z7902 Long term (current) use of antithrombotics/antiplatelets: Secondary | ICD-10-CM | POA: Diagnosis not present

## 2019-12-25 DIAGNOSIS — Z7984 Long term (current) use of oral hypoglycemic drugs: Secondary | ICD-10-CM | POA: Insufficient documentation

## 2019-12-25 DIAGNOSIS — Z951 Presence of aortocoronary bypass graft: Secondary | ICD-10-CM | POA: Diagnosis not present

## 2019-12-25 DIAGNOSIS — R42 Dizziness and giddiness: Secondary | ICD-10-CM | POA: Diagnosis present

## 2019-12-25 DIAGNOSIS — Z87891 Personal history of nicotine dependence: Secondary | ICD-10-CM | POA: Insufficient documentation

## 2019-12-25 DIAGNOSIS — Z85118 Personal history of other malignant neoplasm of bronchus and lung: Secondary | ICD-10-CM | POA: Insufficient documentation

## 2019-12-25 DIAGNOSIS — I10 Essential (primary) hypertension: Secondary | ICD-10-CM | POA: Insufficient documentation

## 2019-12-25 DIAGNOSIS — I2510 Atherosclerotic heart disease of native coronary artery without angina pectoris: Secondary | ICD-10-CM | POA: Diagnosis not present

## 2019-12-25 DIAGNOSIS — R110 Nausea: Secondary | ICD-10-CM | POA: Diagnosis not present

## 2019-12-25 DIAGNOSIS — Z7982 Long term (current) use of aspirin: Secondary | ICD-10-CM | POA: Diagnosis not present

## 2019-12-25 LAB — COMPREHENSIVE METABOLIC PANEL
ALT: 58 U/L — ABNORMAL HIGH (ref 0–44)
AST: 46 U/L — ABNORMAL HIGH (ref 15–41)
Albumin: 4.7 g/dL (ref 3.5–5.0)
Alkaline Phosphatase: 70 U/L (ref 38–126)
Anion gap: 11 (ref 5–15)
BUN: 20 mg/dL (ref 8–23)
CO2: 25 mmol/L (ref 22–32)
Calcium: 9.6 mg/dL (ref 8.9–10.3)
Chloride: 105 mmol/L (ref 98–111)
Creatinine, Ser: 1.45 mg/dL — ABNORMAL HIGH (ref 0.61–1.24)
GFR calc Af Amer: 58 mL/min — ABNORMAL LOW (ref >60)
GFR calc non Af Amer: 50 mL/min — ABNORMAL LOW (ref >60)
Glucose, Bld: 121 mg/dL — ABNORMAL HIGH (ref 70–99)
Potassium: 4.5 mmol/L (ref 3.5–5.1)
Sodium: 141 mmol/L (ref 135–145)
Total Bilirubin: 0.3 mg/dL (ref 0.3–1.2)
Total Protein: 9.4 g/dL — ABNORMAL HIGH (ref 6.5–8.1)

## 2019-12-25 LAB — CBC WITH DIFFERENTIAL/PLATELET
Abs Immature Granulocytes: 0.03 10*3/uL (ref 0.00–0.07)
Basophils Absolute: 0 10*3/uL (ref 0.0–0.1)
Basophils Relative: 0 %
Eosinophils Absolute: 0.1 10*3/uL (ref 0.0–0.5)
Eosinophils Relative: 1 %
HCT: 31.1 % — ABNORMAL LOW (ref 39.0–52.0)
Hemoglobin: 9.6 g/dL — ABNORMAL LOW (ref 13.0–17.0)
Immature Granulocytes: 0 %
Lymphocytes Relative: 33 %
Lymphs Abs: 3 10*3/uL (ref 0.7–4.0)
MCH: 31.5 pg (ref 26.0–34.0)
MCHC: 30.9 g/dL (ref 30.0–36.0)
MCV: 102 fL — ABNORMAL HIGH (ref 80.0–100.0)
Monocytes Absolute: 1.1 10*3/uL — ABNORMAL HIGH (ref 0.1–1.0)
Monocytes Relative: 12 %
Neutro Abs: 4.8 10*3/uL (ref 1.7–7.7)
Neutrophils Relative %: 54 %
Platelets: 374 10*3/uL (ref 150–400)
RBC: 3.05 MIL/uL — ABNORMAL LOW (ref 4.22–5.81)
RDW: 16.9 % — ABNORMAL HIGH (ref 11.5–15.5)
WBC: 9.2 10*3/uL (ref 4.0–10.5)
nRBC: 0 % (ref 0.0–0.2)

## 2019-12-25 NOTE — ED Provider Notes (Signed)
[facility] DEPT Provider Note   CSN: [id number] Arrival date & time: 12/25/19  1633     History Chief Complaint  Patient presents with  . Dizziness    Kirk [name] is a 64 y.o. male.  Patient presenting with a complaint of dizziness feels as if head is spinning but does not have room spinning.  Ongoing for a while but worse in the last 3 days.  Patient already on [place].  States not helping much.  Onset occurs after a couple steps with walking.  And patient has to support himself to regain equilibrium.  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  Patient denies any visual problems any speech problems any upper or lower extremity numbness or weakness.  Received his last chemo supposedly 2-1/2 weeks ago.  Symptoms are associated with nausea.  Patient states no change in any chest pain or shortness of breath.  Patient asymptomatic here.        Past Medical History:  Diagnosis Date  . Arthritis    "right knee" (09/04/2015)  . Atrial flutter with rapid ventricular response ([place]) 04/19/2019  . Dizziness 10/2019  . Dyspnea    increased exertion  . HTN (hypertension)   . Hyperlipidemia   . Lung cancer ([place]) 05/2019  . NSTEMI (non-ST elevated myocardial infarction) ([place]) 09/04/2015   a. cath 09/05/2015: 95% stenosis mid-LAD, 55% mid-RCA, and 40% prox-RCA. PCI performed w/ Synergy DES to LAD  . PAD (peripheral artery disease) (HCC)    Stenting of bilateral iliacs in 2003.  [name] Refusal of blood transfusions as patient is Jehovah's Witness   . Type II diabetes mellitus [facility])     Patient Active Problem List   Diagnosis Date Noted  . Metastatic non-small cell lung cancer ([place]) 07/07/2019  . Encounter for antineoplastic chemotherapy 07/07/2019  . Encounter for antineoplastic immunotherapy 07/07/2019  . Goals of care, counseling/discussion 06/28/2019  . Adenocarcinoma of right lung, stage 4  ([place]) 06/28/2019  . Cancer associated pain 06/28/2019  . CAD (coronary artery disease) 06/08/2019  . Mediastinal lymphadenopathy 06/03/2019  . Liver metastasis ([place]) 06/03/2019  . Hemoglobin A1C between 7% and 9% indicating borderline diabetic control 05/10/2019  . COVID-19 virus infection 05/10/2019  . Atrial flutter with rapid ventricular response ([place]) 04/19/2019  . Chest pain with moderate risk for cardiac etiology 04/19/2019  . Paroxysmal atrial flutter ([place]) 04/19/2019  . Chest congestion 07/10/2018  . Cough 07/10/2018  . NSTEMI (non-ST elevated myocardial infarction) ([place]) 09/04/2015  . PVD (peripheral vascular disease) ([place]) 06/08/2015  . DDD (degenerative disc disease), cervical 06/08/2015  . Hyperlipidemia 06/08/2015  . Diabetes mellitus with nephropathy ([place]) 06/08/2015  . Diabetes mellitus with peripheral vascular disease ([place]) 06/08/2015  . Annual physical exam 01/03/2015  . PAD (peripheral artery disease) ([place]) 02/18/2013  . Diabetes ([place]) 02/18/2013  . HTN (hypertension) 02/18/2013    Past Surgical History:  Procedure Laterality Date  . CARDIAC CATHETERIZATION N/A 09/05/2015   Procedure: Left Heart Cath and Coronary Angiography;  Surgeon: Troy Sine, MD;  Location: [place] [facility] CV LAB;  Service: Cardiovascular;  Laterality: N/A;  . CARDIAC CATHETERIZATION N/A 09/05/2015   Procedure: Coronary Stent Intervention;  Surgeon: Troy Sine, MD;  Location: [facility] CV LAB;  Service: Cardiovascular;  Laterality: N/A;  . IR IMAGING GUIDED PORT INSERTION  07/15/2019  . 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 PERIPHERAL  VASCULAR CATHETERIZATION Bilateral 2003   "stenting"  . TONSILLECTOMY  ~ 1963       Family History  Problem Relation Age of Onset  . Lymphoma Mother   . Cancer - Prostate Father   . CAD Neg Hx   . Colon cancer Neg Hx   . Rectal cancer Neg Hx   . Stomach cancer Neg Hx   . Esophageal cancer Neg Hx     Social History     Tobacco Use  . Smoking status: Former Smoker    Packs/day: 2.00    Years: 35.00    Pack years: 70.00    Types: Cigarettes    Quit date: 02/19/2003    Years since quitting: 16.8  . Smokeless tobacco: Never Used  Vaping Use  . Vaping Use: Never used  Substance Use Topics  . Alcohol use: Not Currently    Alcohol/week: 0.0 standard drinks    Comment: 09/04/2015 "nothing since 1980s"  . Drug use: Yes    Types: Marijuana    Comment: "smoked reef in the 1970s"    Home Medications Prior to Admission medications   Medication Sig Start Date End Date Taking? Authorizing Provider  acetaminophen (TYLENOL) 500 MG tablet Take 500 mg by mouth every 6 (six) hours as needed for moderate pain or fever.    Yes [provider]  aspirin EC 81 MG tablet Take 81 mg by mouth at bedtime.   Yes [provider]  carvedilol (COREG) 12.5 MG tablet Take 1 tablet (12.5 mg total) by mouth 2 (two) times daily with a meal. Patient taking differently: Take 12.5 mg by mouth at bedtime.  06/09/19  Yes Imogene Burn, PA-C  cetirizine (ZYRTEC) 10 MG tablet TAKE 1 TABLET BY MOUTH DAILY Patient taking differently: Take 10 mg by mouth daily as needed for allergies.  12/13/19  Yes [name], FNP  clopidogrel (PLAVIX) 75 MG tablet Take 1 tablet (75 mg total) by mouth daily. 07/22/19  Yes Burnell Blanks, MD  docusate sodium (COLACE) 100 MG capsule Take 1 capsule (100 mg total) by mouth 2 (two) times daily as needed for mild constipation. 11/22/19  Yes [name], FNP  ferrous sulfate 324 MG TBEC Take 1 tablet (324 mg total) by mouth daily with breakfast. 11/22/19  Yes [name], FNP  folic acid (FOLVITE) 1 MG tablet TAKE 1 TABLET BY MOUTH EVERY DAY 09/29/19  Yes Curt Bears, MD  glimepiride (AMARYL) 4 MG tablet TAKE 1 TABLET BY MOUTH EVERY DAY 09/13/19  Yes [name], FNP  HYDROcodone-homatropine [facility]) 5-1.5 MG/5ML syrup Take 5 mLs by mouth every 8 (eight) hours as  needed for cough. 05/10/19  Yes [name], FNP  ipratropium (ATROVENT) 0.03 % nasal spray USE 2 SPRAYS IN EACH NOSTRIL TWICE DAILY Patient taking differently: Place 1 spray into both nostrils daily as needed (allergies).  01/13/19  Yes [name], FNP  lactulose (CHRONULAC) 10 GM/15ML solution Take 30 mLs (20 g total) by mouth 2 (two) times daily as needed for moderate constipation. 11/22/19  Yes [name], FNP  lidocaine-prilocaine (EMLA) cream APPLY TO THE PORT-A-CATH SITE 30-60 MINUTES BEFORE TREATMENT. Patient taking differently: Apply 1 application topically daily as needed (access port). Apply to the Port-A-Cath site 30-60 minutes before treatment. 07/28/19  Yes Curt Bears, MD  lisinopril (ZESTRIL) 10 MG tablet TAKE 1 TABLET BY MOUTH EVERY DAY 09/13/19  Yes [name], FNP  meclizine (ANTIVERT) 25 MG tablet Take  1 tablet (25 mg total) by mouth 3 (three) times daily as needed for dizziness. Patient taking differently: Take 25 mg by mouth 3 (three) times daily.  11/26/19  Yes [name], FNP  metFORMIN (GLUCOPHAGE) 1000 MG tablet TAKE 1 TABLET (1,000 MG TOTAL) BY MOUTH 2 (TWO) TIMES DAILY WITH A MEAL. 09/13/19  Yes [name], FNP  multivitamin (ONE-A-DAY MEN'S) TABS tablet Take 1 tablet by mouth daily. 11/11/18  Yes [name], FNP  nitroGLYCERIN (NITROSTAT) 0.4 MG SL tablet Place 1 tablet (0.4 mg total) under the tongue every 5 (five) minutes x 3 doses as needed for chest pain. 08/26/17  Yes Imogene Burn, PA-C  oxyCODONE-acetaminophen (PERCOCET/ROXICET) 5-325 MG tablet Take 1 tablet by mouth every 6 (six) hours as needed for severe pain. 06/28/19  Yes Curt Bears, MD  prochlorperazine (COMPAZINE) 10 MG tablet Take 1 tablet (10 mg total) by mouth every 6 (six) hours as needed for nausea or vomiting. 09/02/19  Yes Heilingoetter, Cassandra L, PA-C  rosuvastatin (CRESTOR) 40 MG tablet TAKE 1 TABLET BY MOUTH DAILY 09/14/19  Yes Burnell Blanks,  MD  Hydrocortisone Acetate 1 % CREA Apply 15 g topically 2 (two) times daily. Patient not taking: Reported on 11/26/2019 05/10/19   [name], FNP  pantoprazole (PROTONIX) 40 MG tablet TAKE 1 TABLET BY MOUTH EVERY DAY Patient not taking: Reported on 12/25/2019 06/04/19   Yetta Flock, MD  sucralfate (CARAFATE) 1 GM/10ML suspension Take 10 mLs (1 g total) by mouth 4 (four) times daily. Patient not taking: Reported on 12/25/2019 05/13/19   Armbruster, Carlota Raspberry, MD    Allergies    Patient has no known allergies.  Review of Systems   Review of Systems  Constitutional: Negative for chills and fever.  HENT: Negative for congestion, rhinorrhea and sore throat.   Eyes: Negative for visual disturbance.  Respiratory: Negative for cough and shortness of breath.   Cardiovascular: Negative for chest pain and leg swelling.  Gastrointestinal: Positive for nausea. Negative for abdominal pain, diarrhea and vomiting.  Genitourinary: Negative for dysuria.  Musculoskeletal: Negative for back pain and neck pain.  Skin: Negative for rash.  Neurological: Positive for dizziness. Negative for light-headedness and headaches.  Hematological: Does not bruise/bleed easily.  Psychiatric/Behavioral: Negative for confusion.    Physical Exam Updated Vital Signs BP (!) 153/69   Pulse 73   Temp 98.1 F (36.7 C) (Oral)   Resp 18   Ht 1.905 m (6\' 3" )   Wt (!) 96.2 kg   SpO2 100%   BMI 26.50 kg/m   Physical Exam Vitals and nursing note reviewed.  Constitutional:      Appearance: Normal appearance. He is well-developed.  HENT:     Head: Normocephalic and atraumatic.  Eyes:     Extraocular Movements: Extraocular movements intact.     Conjunctiva/sclera: Conjunctivae normal.     Pupils: Pupils are equal, round, and reactive to light.  Cardiovascular:     Rate and Rhythm: Normal rate and regular rhythm.     Heart sounds: No murmur heard.   Pulmonary:     Effort: Pulmonary effort is normal. No  respiratory distress.     Breath sounds: Normal breath sounds.  Abdominal:     Palpations: Abdomen is soft.     Tenderness: There is no abdominal tenderness.  Musculoskeletal:        General: No swelling. Normal range of motion.     Cervical back: Normal range of motion and  neck supple.  Skin:    General: Skin is warm and dry.     Capillary Refill: Capillary refill takes less than 2 seconds.  Neurological:     General: No focal deficit present.     Mental Status: He is alert and oriented to person, place, and time.     Cranial Nerves: No cranial nerve deficit.     Sensory: No sensory deficit.     Motor: No weakness.     Coordination: Coordination normal.     Comments: Patient not ambulated.     ED Results / Procedures / Treatments   Labs (all labs ordered are listed, but only abnormal results are displayed) Labs Reviewed  CBC WITH DIFFERENTIAL/PLATELET - Abnormal; Notable for the following components:      Result Value   RBC 3.05 (*)    Hemoglobin 9.6 (*)    HCT 31.1 (*)    MCV 102.0 (*)    RDW 16.9 (*)    Monocytes Absolute 1.1 (*)    All other components within normal limits  COMPREHENSIVE METABOLIC PANEL - Abnormal; Notable for the following components:   Glucose, Bld 121 (*)    Creatinine, Ser 1.45 (*)    Total Protein 9.4 (*)    AST 46 (*)    ALT 58 (*)    GFR calc non Af Amer 50 (*)    GFR calc Af Amer 58 (*)    All other components within normal limits    EKG None  Radiology CT Head Wo Contrast  Result Date: 12/25/2019 CLINICAL DATA:  Dizziness EXAM: CT HEAD WITHOUT CONTRAST TECHNIQUE: Contiguous axial images were obtained from the base of the skull through the vertex without intravenous contrast. COMPARISON:  None FINDINGS: Brain: No evidence of acute territorial infarction, hemorrhage, hydrocephalus,extra-axial collection or mass lesion/mass effect. There is mild dilatation the ventricles and sulci consistent with age-related atrophy. Low-attenuation  changes in the deep white matter consistent with small vessel ischemia. Vascular: No hyperdense vessel or unexpected calcification. Skull: The skull is intact. No fracture or focal lesion identified. Sinuses/Orbits: The visualized paranasal sinuses and mastoid air cells are clear. The orbits and globes intact. Other: None IMPRESSION: No acute intracranial abnormality. Findings consistent with age related atrophy and chronic small vessel ischemia Electronically Signed   By: Prudencio Pair M.D.   On: 12/25/2019 19:31   MR Brain Wo Contrast (neuro protocol)  Result Date: 12/25/2019 CLINICAL DATA:  Neuro deficit, acute, stroke suspected. Additional history provided: Acute onset vertigo. Stage IV non-small cell lung cancer. EXAM: MRI HEAD WITHOUT CONTRAST TECHNIQUE: Multiplanar, multiecho pulse sequences of the brain and surrounding structures were obtained without intravenous contrast. COMPARISON:  Noncontrast head CT 12/25/2019., brain MRI 07/26/2019. FINDINGS: Brain: Please note there is limited assessment for intracranial metastatic disease on this noncontrast examination. Cerebral volume is normal for age. No focal parenchymal signal abnormality is identified. There is no acute infarct. No evidence of intracranial mass. No chronic intracranial blood products. No extra-axial fluid collection. No midline shift. Vascular: Expected proximal arterial flow voids. Skull and upper cervical spine: Redemonstrated 7 mm lesion within the left parietal calvarium demonstrating restricted diffusion. This lesion demonstrated enhancement on the prior MRI of 07/26/2019 and is suspicious for a osseous metastatic lesion. Redemonstrated subcentimeter nonspecific T2/FLAIR hyperintense lesion within the right parietal scalp with associated restricted diffusion, nonspecific but possibly reflecting an epidermoid cyst. Sinuses/Orbits: Visualized orbits show no acute finding. Mild ethmoid and maxillary sinus mucosal thickening. Small left  maxillary sinus mucous  retention cyst. Trace right mastoid effusion IMPRESSION: Please note there is limited assessment for intracranial metastatic disease on this noncontrast MRI. No noncontrast evidence of acute intracranial abnormality, including acute infarction. Unchanged 7 mm lesion within the left parietal calvarium concerning for an osseous metastasis. Mild ethmoid and maxillary sinus mucosal thickening. Small left maxillary sinus mucous retention cyst. Trace right mastoid effusion. Electronically Signed   By: Kellie Simmering DO   On: 12/25/2019 22:28    Procedures Procedures (including critical care time)  Medications Ordered in ED Medications - No data to display  ED Course  I have reviewed the triage vital signs and the nursing notes.  Pertinent labs & imaging results that were available during my care of the patient were reviewed by me and considered in my medical decision making (see chart for details).    MDM Rules/Calculators/A&P                          Work-up here for the dizziness with a component of vertigo without any acute findings of an hemoglobin 9.6.  CT head was negative MRI brain without evidence of stroke.  Since the MRI brain was done without contrast not 100% in identifying small neoplastic process.  Pre-existing lesion to the skull.  Which was present before.  Electrolytes including liver function test without any significant changes.  Patient will follow back up with hematology oncology on Monday.  He will continue his Antivert.   Final Clinical Impression(s) / ED Diagnoses Final diagnoses:  Dizziness    Rx / DC Orders ED Discharge Orders    None       Fredia Sorrow, MD 12/26/19 1349

## 2019-12-25 NOTE — ED Notes (Signed)
Pt transported to MRI 

## 2019-12-25 NOTE — Discharge Instructions (Addendum)
Continue take your Antivert. Call your hematologist oncologist on Monday to have them review the studies from the night to head CT and the MRI. Certainly no evidence of any stroke. No evidence of any obvious neoplastic process. But there could still be something subtle. Also referral made to neurology for further evaluation of the dizziness/vertigo.

## 2019-12-25 NOTE — ED Notes (Signed)
MRI called

## 2019-12-25 NOTE — ED Triage Notes (Signed)
Patient reports when he stands up he gets dizzy and if stands up for a long time he gets nauseated. Patient reports last chemo was 2.5 weeks.

## 2019-12-27 ENCOUNTER — Telehealth: Payer: Self-pay | Admitting: Medical Oncology

## 2019-12-27 NOTE — Telephone Encounter (Signed)
Sent to NP 

## 2019-12-27 NOTE — Telephone Encounter (Signed)
-  Dizziness- He went to ED  on sat -had CT /MRI . He feels better today.Just an FYI.

## 2019-12-29 ENCOUNTER — Encounter: Payer: Self-pay | Admitting: Internal Medicine

## 2019-12-29 ENCOUNTER — Inpatient Hospital Stay: Payer: Medicare Other

## 2019-12-29 ENCOUNTER — Other Ambulatory Visit: Payer: Self-pay

## 2019-12-29 ENCOUNTER — Inpatient Hospital Stay: Payer: Medicare Other | Attending: Internal Medicine | Admitting: Internal Medicine

## 2019-12-29 VITALS — BP 140/57 | HR 84 | Temp 97.5°F | Resp 20 | Ht 75.0 in | Wt 216.2 lb

## 2019-12-29 DIAGNOSIS — Z79899 Other long term (current) drug therapy: Secondary | ICD-10-CM | POA: Insufficient documentation

## 2019-12-29 DIAGNOSIS — I252 Old myocardial infarction: Secondary | ICD-10-CM | POA: Insufficient documentation

## 2019-12-29 DIAGNOSIS — K5900 Constipation, unspecified: Secondary | ICD-10-CM | POA: Diagnosis not present

## 2019-12-29 DIAGNOSIS — C3490 Malignant neoplasm of unspecified part of unspecified bronchus or lung: Secondary | ICD-10-CM

## 2019-12-29 DIAGNOSIS — E785 Hyperlipidemia, unspecified: Secondary | ICD-10-CM | POA: Diagnosis not present

## 2019-12-29 DIAGNOSIS — I2510 Atherosclerotic heart disease of native coronary artery without angina pectoris: Secondary | ICD-10-CM

## 2019-12-29 DIAGNOSIS — Z7984 Long term (current) use of oral hypoglycemic drugs: Secondary | ICD-10-CM | POA: Diagnosis not present

## 2019-12-29 DIAGNOSIS — R42 Dizziness and giddiness: Secondary | ICD-10-CM | POA: Insufficient documentation

## 2019-12-29 DIAGNOSIS — C787 Secondary malignant neoplasm of liver and intrahepatic bile duct: Secondary | ICD-10-CM | POA: Insufficient documentation

## 2019-12-29 DIAGNOSIS — R5383 Other fatigue: Secondary | ICD-10-CM | POA: Insufficient documentation

## 2019-12-29 DIAGNOSIS — D6481 Anemia due to antineoplastic chemotherapy: Secondary | ICD-10-CM | POA: Insufficient documentation

## 2019-12-29 DIAGNOSIS — Z95828 Presence of other vascular implants and grafts: Secondary | ICD-10-CM

## 2019-12-29 DIAGNOSIS — I10 Essential (primary) hypertension: Secondary | ICD-10-CM

## 2019-12-29 DIAGNOSIS — Z5112 Encounter for antineoplastic immunotherapy: Secondary | ICD-10-CM | POA: Diagnosis present

## 2019-12-29 DIAGNOSIS — E1151 Type 2 diabetes mellitus with diabetic peripheral angiopathy without gangrene: Secondary | ICD-10-CM | POA: Insufficient documentation

## 2019-12-29 DIAGNOSIS — Z5111 Encounter for antineoplastic chemotherapy: Secondary | ICD-10-CM | POA: Insufficient documentation

## 2019-12-29 DIAGNOSIS — L299 Pruritus, unspecified: Secondary | ICD-10-CM | POA: Diagnosis not present

## 2019-12-29 DIAGNOSIS — C3491 Malignant neoplasm of unspecified part of right bronchus or lung: Secondary | ICD-10-CM | POA: Diagnosis not present

## 2019-12-29 DIAGNOSIS — T451X5A Adverse effect of antineoplastic and immunosuppressive drugs, initial encounter: Secondary | ICD-10-CM | POA: Insufficient documentation

## 2019-12-29 DIAGNOSIS — Z7982 Long term (current) use of aspirin: Secondary | ICD-10-CM | POA: Diagnosis not present

## 2019-12-29 DIAGNOSIS — R590 Localized enlarged lymph nodes: Secondary | ICD-10-CM | POA: Insufficient documentation

## 2019-12-29 DIAGNOSIS — R5382 Chronic fatigue, unspecified: Secondary | ICD-10-CM

## 2019-12-29 LAB — CMP (CANCER CENTER ONLY)
ALT: 27 U/L (ref 0–44)
AST: 24 U/L (ref 15–41)
Albumin: 3.7 g/dL (ref 3.5–5.0)
Alkaline Phosphatase: 74 U/L (ref 38–126)
Anion gap: 11 (ref 5–15)
BUN: 23 mg/dL (ref 8–23)
CO2: 23 mmol/L (ref 22–32)
Calcium: 9.8 mg/dL (ref 8.9–10.3)
Chloride: 106 mmol/L (ref 98–111)
Creatinine: 1.43 mg/dL — ABNORMAL HIGH (ref 0.61–1.24)
GFR, Est AFR Am: 59 mL/min — ABNORMAL LOW (ref >60)
GFR, Estimated: 51 mL/min — ABNORMAL LOW (ref >60)
Glucose, Bld: 231 mg/dL — ABNORMAL HIGH (ref 70–99)
Potassium: 4.4 mmol/L (ref 3.5–5.1)
Sodium: 140 mmol/L (ref 135–145)
Total Bilirubin: <0.2 mg/dL — ABNORMAL LOW (ref 0.3–1.2)
Total Protein: 8.2 g/dL — ABNORMAL HIGH (ref 6.5–8.1)

## 2019-12-29 LAB — CBC WITH DIFFERENTIAL (CANCER CENTER ONLY)
Abs Immature Granulocytes: 0.02 10*3/uL (ref 0.00–0.07)
Basophils Absolute: 0.1 10*3/uL (ref 0.0–0.1)
Basophils Relative: 1 %
Eosinophils Absolute: 0.1 10*3/uL (ref 0.0–0.5)
Eosinophils Relative: 2 %
HCT: 28.4 % — ABNORMAL LOW (ref 39.0–52.0)
Hemoglobin: 9 g/dL — ABNORMAL LOW (ref 13.0–17.0)
Immature Granulocytes: 0 %
Lymphocytes Relative: 30 %
Lymphs Abs: 2.5 10*3/uL (ref 0.7–4.0)
MCH: 31.7 pg (ref 26.0–34.0)
MCHC: 31.7 g/dL (ref 30.0–36.0)
MCV: 100 fL (ref 80.0–100.0)
Monocytes Absolute: 0.9 10*3/uL (ref 0.1–1.0)
Monocytes Relative: 11 %
Neutro Abs: 4.8 10*3/uL (ref 1.7–7.7)
Neutrophils Relative %: 56 %
Platelet Count: 370 10*3/uL (ref 150–400)
RBC: 2.84 MIL/uL — ABNORMAL LOW (ref 4.22–5.81)
RDW: 16.3 % — ABNORMAL HIGH (ref 11.5–15.5)
WBC Count: 8.5 10*3/uL (ref 4.0–10.5)
nRBC: 0 % (ref 0.0–0.2)

## 2019-12-29 LAB — TSH: TSH: 0.763 u[IU]/mL (ref 0.320–4.118)

## 2019-12-29 MED ORDER — SODIUM CHLORIDE 0.9 % IV SOLN
200.0000 mg | Freq: Once | INTRAVENOUS | Status: AC
  Administered 2019-12-29: 200 mg via INTRAVENOUS
  Filled 2019-12-29: qty 8

## 2019-12-29 MED ORDER — HEPARIN SOD (PORK) LOCK FLUSH 100 UNIT/ML IV SOLN
500.0000 [IU] | Freq: Once | INTRAVENOUS | Status: AC | PRN
  Administered 2019-12-29: 500 [IU]
  Filled 2019-12-29: qty 5

## 2019-12-29 MED ORDER — SODIUM CHLORIDE 0.9 % IV SOLN
500.0000 mg/m2 | Freq: Once | INTRAVENOUS | Status: AC
  Administered 2019-12-29: 1100 mg via INTRAVENOUS
  Filled 2019-12-29: qty 40

## 2019-12-29 MED ORDER — PROCHLORPERAZINE MALEATE 10 MG PO TABS
ORAL_TABLET | ORAL | Status: AC
  Filled 2019-12-29: qty 1

## 2019-12-29 MED ORDER — SODIUM CHLORIDE 0.9% FLUSH
10.0000 mL | INTRAVENOUS | Status: DC | PRN
  Administered 2019-12-29: 10 mL
  Filled 2019-12-29: qty 10

## 2019-12-29 MED ORDER — SODIUM CHLORIDE 0.9 % IV SOLN
Freq: Once | INTRAVENOUS | Status: AC
  Filled 2019-12-29: qty 250

## 2019-12-29 MED ORDER — PROCHLORPERAZINE MALEATE 10 MG PO TABS
10.0000 mg | ORAL_TABLET | Freq: Once | ORAL | Status: AC
  Administered 2019-12-29: 10 mg via ORAL

## 2019-12-29 MED ORDER — CYANOCOBALAMIN 1000 MCG/ML IJ SOLN: 1000.0000 ug | Freq: Once | INTRAMUSCULAR | Status: DC

## 2019-12-29 MED ORDER — SODIUM CHLORIDE 0.9% FLUSH
10.0000 mL | Freq: Once | INTRAVENOUS | Status: AC
  Administered 2019-12-29: 10 mL
  Filled 2019-12-29: qty 10

## 2019-12-29 NOTE — Patient Instructions (Signed)

## 2019-12-29 NOTE — Patient Instructions (Signed)
[facility] Discharge Instructions for Patients Receiving Chemotherapy  Today you received the following chemotherapy agents: keytruda, alimta  To help prevent nausea and vomiting after your treatment, we encourage you to take your nausea medication as directed.    If you develop nausea and vomiting that is not controlled by your nausea medication, call the clinic.   BELOW ARE SYMPTOMS THAT SHOULD BE REPORTED IMMEDIATELY:  *FEVER GREATER THAN 100.5 F  *CHILLS WITH OR WITHOUT FEVER  NAUSEA AND VOMITING THAT IS NOT CONTROLLED WITH YOUR NAUSEA MEDICATION  *UNUSUAL SHORTNESS OF BREATH  *UNUSUAL BRUISING OR BLEEDING  TENDERNESS IN MOUTH AND THROAT WITH OR WITHOUT PRESENCE OF ULCERS  *URINARY PROBLEMS  *BOWEL PROBLEMS  UNUSUAL RASH Items with * indicate a potential emergency and should be followed up as soon as possible.  Feel free to call the clinic should you have any questions or concerns. The clinic phone number is [phone].  Please show the [place] at check-in to the Emergency Department and triage nurse.

## 2019-12-29 NOTE — Progress Notes (Signed)
[place] Telephone:([phone]) [phone]   Fax:([phone]) [phone]  OFFICE PROGRESS NOTE  [name], FNP [name] [address]  DIAGNOSIS: Stage IV (TX, N3, M1c) non-small cell lung cancer, poorly differentiated adenocarcinoma presented with bulky mediastinal lymphadenopathy in addition to right axillary lymphadenopathy and metastatic liver lesions, abdominal lymphadenopathy diagnosed in January 2021. Molecular studies by [employer] that shows no actionable mutations.  PRIOR THERAPY: None  CURRENT THERAPY: Systemic chemotherapy with carboplatin for AUC of 5, Alimta 500 mg/M2 and Keytruda 200 mg IV every 3 weeks.  First dose July 14, 2019.  Status post 8 cycles. Starting from cycle #5 the patient will be treated with maintenance Alimta and Keytruda every 3 weeks.  INTERVAL HISTORY: Kirk [name] 64 y.o. male returns to the clinic today for follow-up visit.  The patient is feeling fine today with no concerning complaints.  He was seen at the emergency department few days ago because of dizzy spells.  He had CT of the head as well as MRI of the brain that showed no suspicious brain metastasis.  The patient also has some itching.  He has mild nausea improved with Compazine.  He denied having any chest pain, shortness of breath, cough or hemoptysis.  He denied having any fever or chills.  He has no current nausea, vomiting, diarrhea or constipation.  The patient is here today for evaluation before starting cycle #9 of his treatment.    MEDICAL HISTORY: Past Medical History:  Diagnosis Date  . Arthritis    "right knee" (09/04/2015)  . Atrial flutter with rapid ventricular response ([facility]) 04/19/2019  . Dizziness 10/2019  . Dyspnea    increased exertion  . HTN (hypertension)   . Hyperlipidemia   . Lung cancer ([facility]) 05/2019  . NSTEMI (non-ST elevated myocardial infarction) ([facility]) 09/04/2015   a. cath 09/05/2015: 95% stenosis mid-LAD, 55% mid-RCA, and  40% prox-RCA. PCI performed w/ Synergy DES to LAD  . PAD (peripheral artery disease) ([facility])    Stenting of bilateral iliacs in 2003.  [name] Refusal of blood transfusions as patient is Jehovah's Witness   . Type II diabetes mellitus ([facility])     ALLERGIES:  has No Known Allergies.  MEDICATIONS:  Current Outpatient Medications  Medication Sig Dispense Refill  . acetaminophen (TYLENOL) 500 MG tablet Take 500 mg by mouth every 6 (six) hours as needed for moderate pain or fever.     [name] aspirin EC 81 MG tablet Take 81 mg by mouth at bedtime.    . carvedilol (COREG) 12.5 MG tablet Take 1 tablet (12.5 mg total) by mouth 2 (two) times daily with a meal. (Patient taking differently: Take 12.5 mg by mouth at bedtime. ) 180 tablet 3  . cetirizine (ZYRTEC) 10 MG tablet TAKE 1 TABLET BY MOUTH DAILY (Patient taking differently: Take 10 mg by mouth daily as needed for allergies. ) 30 tablet 0  . clopidogrel (PLAVIX) 75 MG tablet Take 1 tablet (75 mg total) by mouth daily. 90 tablet 3  . docusate sodium (COLACE) 100 MG capsule Take 1 capsule (100 mg total) by mouth 2 (two) times daily as needed for mild constipation. 60 capsule 11  . ferrous sulfate 324 MG TBEC Take 1 tablet (324 mg total) by mouth daily with breakfast. 30 tablet 11  . folic acid (FOLVITE) 1 MG tablet TAKE 1 TABLET BY MOUTH EVERY DAY 90 tablet 1  . glimepiride (AMARYL) 4 MG tablet TAKE 1 TABLET  BY MOUTH EVERY DAY 90 tablet 1  . HYDROcodone-homatropine (HYCODAN) 5-1.5 MG/5ML syrup Take 5 mLs by mouth every 8 (eight) hours as needed for cough. 120 mL 0  . Hydrocortisone Acetate 1 % CREA Apply 15 g topically 2 (two) times daily. (Patient not taking: Reported on 11/26/2019) 15 g 3  . ipratropium (ATROVENT) 0.03 % nasal spray USE 2 SPRAYS IN EACH NOSTRIL TWICE DAILY (Patient taking differently: Place 1 spray into both nostrils daily as needed (allergies). ) 30 mL 2  . lactulose (CHRONULAC) 10 GM/15ML solution Take 30 mLs (20 g total) by mouth 2 (two) times  daily as needed for moderate constipation. 240 mL 11  . lidocaine-prilocaine (EMLA) cream APPLY TO THE PORT-A-CATH SITE 30-60 MINUTES BEFORE TREATMENT. (Patient taking differently: Apply 1 application topically daily as needed (access port). Apply to the Port-A-Cath site 30-60 minutes before treatment.) 30 g 0  . lisinopril (ZESTRIL) 10 MG tablet TAKE 1 TABLET BY MOUTH EVERY DAY 90 tablet 1  . meclizine (ANTIVERT) 25 MG tablet Take 1 tablet (25 mg total) by mouth 3 (three) times daily as needed for dizziness. (Patient taking differently: Take 25 mg by mouth 3 (three) times daily. ) 90 tablet 3  . metFORMIN (GLUCOPHAGE) 1000 MG tablet TAKE 1 TABLET (1,000 MG TOTAL) BY MOUTH 2 (TWO) TIMES DAILY WITH A MEAL. 180 tablet 1  . multivitamin (ONE-A-DAY MEN'S) TABS tablet Take 1 tablet by mouth daily. 30 tablet 6  . nitroGLYCERIN (NITROSTAT) 0.4 MG SL tablet Place 1 tablet (0.4 mg total) under the tongue every 5 (five) minutes x 3 doses as needed for chest pain. 25 tablet 2  . oxyCODONE-acetaminophen (PERCOCET/ROXICET) 5-325 MG tablet Take 1 tablet by mouth every 6 (six) hours as needed for severe pain. 30 tablet 0  . pantoprazole (PROTONIX) 40 MG tablet TAKE 1 TABLET BY MOUTH EVERY DAY (Patient not taking: Reported on 12/25/2019) 30 tablet 5  . prochlorperazine (COMPAZINE) 10 MG tablet Take 1 tablet (10 mg total) by mouth every 6 (six) hours as needed for nausea or vomiting. 30 tablet 1  . rosuvastatin (CRESTOR) 40 MG tablet TAKE 1 TABLET BY MOUTH DAILY 90 tablet 3  . sucralfate (CARAFATE) 1 GM/10ML suspension Take 10 mLs (1 g total) by mouth 4 (four) times daily. (Patient not taking: Reported on 12/25/2019) 420 mL 1   No current facility-administered medications for this visit.    SURGICAL HISTORY:  Past Surgical History:  Procedure Laterality Date  . CARDIAC CATHETERIZATION N/A 09/05/2015   Procedure: Left Heart Cath and Coronary Angiography;  Surgeon: Troy Sine, MD;  Location: [facility];   Service: Cardiovascular;  Laterality: N/A;  . CARDIAC CATHETERIZATION N/A 09/05/2015   Procedure: Coronary Stent Intervention;  Surgeon: Troy Sine, MD;  Location: [facility];  Service: Cardiovascular;  Laterality: N/A;  . IR IMAGING GUIDED PORT INSERTION  07/15/2019  . 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 PERIPHERAL VASCULAR CATHETERIZATION Bilateral 2003   "stenting"  . TONSILLECTOMY  ~ 1963    REVIEW OF SYSTEMS:  A comprehensive review of systems was negative except for: Constitutional: positive for fatigue Integument/breast: positive for pruritus Neurological: positive for dizziness   PHYSICAL EXAMINATION: General appearance: alert, cooperative, appears stated age, fatigued and no distress Head: Normocephalic, without obvious abnormality, atraumatic Neck: no adenopathy, no JVD, supple, symmetrical, trachea midline and thyroid not enlarged, symmetric, no tenderness/mass/nodules Lymph nodes: Cervical, supraclavicular, and axillary nodes normal.  Resp: clear to auscultation bilaterally Back: symmetric, no curvature. ROM normal. No CVA tenderness. Cardio: regular rate and rhythm, S1, S2 normal, no murmur, click, rub or gallop GI: soft, non-tender; bowel sounds normal; no masses,  no organomegaly Extremities: extremities normal, atraumatic, no cyanosis or edema  ECOG PERFORMANCE STATUS: 1 - Symptomatic but completely ambulatory  Blood pressure (!) 140/57, pulse 84, temperature (!) 97.5 F (36.4 C), temperature source Temporal, resp. rate 20, height 6\' 3"  (1.905 m), weight 216 lb 3.2 oz (98.1 kg), SpO2 100 %.  LABORATORY DATA: Lab Results  Component Value Date   WBC 8.5 12/29/2019   HGB 9.0 (L) 12/29/2019   HCT 28.4 (L) 12/29/2019   MCV 100.0 12/29/2019   PLT 370 12/29/2019      Chemistry      Component Value Date/Time   NA 141 12/25/2019 1850   NA 140 06/08/2015 0840   K 4.5 12/25/2019 1850   CL 105 12/25/2019 1850   CO2 25  12/25/2019 1850   BUN 20 12/25/2019 1850   BUN 15 06/08/2015 0840   CREATININE 1.45 (H) 12/25/2019 1850   CREATININE 1.42 (H) 12/08/2019 1134   CREATININE 1.06 10/01/2016 0855      Component Value Date/Time   CALCIUM 9.6 12/25/2019 1850   ALKPHOS 70 12/25/2019 1850   AST 46 (H) 12/25/2019 1850   AST 38 12/08/2019 1134   ALT 58 (H) 12/25/2019 1850   ALT 32 12/08/2019 1134   BILITOT 0.3 12/25/2019 1850   BILITOT 0.2 (L) 12/08/2019 1134       RADIOGRAPHIC STUDIES: CT Head Wo Contrast  Result Date: 12/25/2019 CLINICAL DATA:  Dizziness EXAM: CT HEAD WITHOUT CONTRAST TECHNIQUE: Contiguous axial images were obtained from the base of the skull through the vertex without intravenous contrast. COMPARISON:  None FINDINGS: Brain: No evidence of acute territorial infarction, hemorrhage, hydrocephalus,extra-axial collection or mass lesion/mass effect. There is mild dilatation the ventricles and sulci consistent with age-related atrophy. Low-attenuation changes in the deep white matter consistent with small vessel ischemia. Vascular: No hyperdense vessel or unexpected calcification. Skull: The skull is intact. No fracture or focal lesion identified. Sinuses/Orbits: The visualized paranasal sinuses and mastoid air cells are clear. The orbits and globes intact. Other: None IMPRESSION: No acute intracranial abnormality. Findings consistent with age related atrophy and chronic small vessel ischemia Electronically Signed   By: Prudencio Pair M.D.   On: 12/25/2019 19:31   MR Brain Wo Contrast (neuro protocol)  Result Date: 12/25/2019 CLINICAL DATA:  Neuro deficit, acute, stroke suspected. Additional history provided: Acute onset vertigo. Stage IV non-small cell lung cancer. EXAM: MRI HEAD WITHOUT CONTRAST TECHNIQUE: Multiplanar, multiecho pulse sequences of the brain and surrounding structures were obtained without intravenous contrast. COMPARISON:  Noncontrast head CT 12/25/2019., brain MRI 07/26/2019.  FINDINGS: Brain: Please note there is limited assessment for intracranial metastatic disease on this noncontrast examination. Cerebral volume is normal for age. No focal parenchymal signal abnormality is identified. There is no acute infarct. No evidence of intracranial mass. No chronic intracranial blood products. No extra-axial fluid collection. No midline shift. Vascular: Expected proximal arterial flow voids. Skull and upper cervical spine: Redemonstrated 7 mm lesion within the left parietal calvarium demonstrating restricted diffusion. This lesion demonstrated enhancement on the prior MRI of 07/26/2019 and is suspicious for a osseous metastatic lesion. Redemonstrated subcentimeter nonspecific T2/FLAIR hyperintense lesion within the right parietal scalp with associated restricted diffusion, nonspecific but possibly reflecting an epidermoid cyst. Sinuses/Orbits: Visualized orbits show no acute finding. Mild ethmoid  and maxillary sinus mucosal thickening. Small left maxillary sinus mucous retention cyst. Trace right mastoid effusion IMPRESSION: Please note there is limited assessment for intracranial metastatic disease on this noncontrast MRI. No noncontrast evidence of acute intracranial abnormality, including acute infarction. Unchanged 7 mm lesion within the left parietal calvarium concerning for an osseous metastasis. Mild ethmoid and maxillary sinus mucosal thickening. Small left maxillary sinus mucous retention cyst. Trace right mastoid effusion. Electronically Signed   By: Kellie Simmering DO   On: 12/25/2019 22:28    ASSESSMENT AND PLAN: This is a very pleasant 64 years old African-American male recently diagnosed with a stage IV (TX, N3, M1c) non-small cell lung cancer, poorly differentiated adenocarcinoma presented with bulky mediastinal as well as right hilar, supraclavicular, abdominal and right axillary lymphadenopathy in addition to metastatic liver lesions diagnosed in January 2021. Molecular  studies by [employer] shows no actionable mutations.  The patient is not a candidate for treatment with targeted therapy or enrollment in the clinical trial with the [facility] regimen. The patient is currently undergoing systemic chemotherapy with carboplatin for AUC of 5, Alimta 500 mg/M2 and Keytruda 200 mg IV every 3 weeks status post 8 cycles. Starting from cycle #5 he is treated with maintenance Alimta and Keytruda every 3 weeks. The patient continues to tolerate his treatment well except for occasional dizzy spells secondary to chemotherapy-induced anemia. I recommended for him to proceed with cycle #9 today as planned. I will see him back for follow-up visit in 3 weeks for evaluation with repeat CT scan of the chest, abdomen pelvis for restaging of his disease. For the chemotherapy-induced anemia, the patient was advised to take oral iron tablets at regular basis.  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 For pain management he will continue on Percocet on as-needed basis. For constipation the patient is currently on treatment with MiraLAX and milk of magnesia as needed. The patient was advised to call immediately if he has any concerning symptoms in the interval. The patient voices understanding of current disease status and treatment options and is in agreement with the current care plan.  All questions were answered. The patient knows to call the clinic with any problems, questions or concerns. We can certainly see the patient much sooner if necessary.  Disclaimer: This note was dictated with voice recognition software. Similar sounding words can inadvertently be transcribed and may not be corrected upon review.

## 2019-12-31 ENCOUNTER — Telehealth (INDEPENDENT_AMBULATORY_CARE_PROVIDER_SITE_OTHER): Payer: Medicare Other | Admitting: Family Medicine

## 2019-12-31 ENCOUNTER — Other Ambulatory Visit: Payer: Self-pay

## 2019-12-31 DIAGNOSIS — Z09 Encounter for follow-up examination after completed treatment for conditions other than malignant neoplasm: Secondary | ICD-10-CM

## 2019-12-31 DIAGNOSIS — R42 Dizziness and giddiness: Secondary | ICD-10-CM

## 2019-12-31 DIAGNOSIS — J3489 Other specified disorders of nose and nasal sinuses: Secondary | ICD-10-CM

## 2019-12-31 DIAGNOSIS — R110 Nausea: Secondary | ICD-10-CM

## 2019-12-31 DIAGNOSIS — I10 Essential (primary) hypertension: Secondary | ICD-10-CM

## 2019-12-31 DIAGNOSIS — C3490 Malignant neoplasm of unspecified part of unspecified bronchus or lung: Secondary | ICD-10-CM

## 2019-12-31 NOTE — Progress Notes (Signed)
Virtual Visit via Telephone Note  I connected with Kirk [name] on 01/06/20 at 10:00 AM EDT by telephone and verified that I am speaking with the correct person using two identifiers.   I discussed the limitations, risks, security and privacy concerns of performing an evaluation and management service by telephone and the availability of in person appointments. I also discussed with the patient that there may be a patient responsible charge related to this service. The patient expressed understanding and agreed to proceed.  Televisit Today Patient Location: Home Provider Location: Office   History of Present Illness:  Past Surgical History:  Procedure Laterality Date  . CARDIAC CATHETERIZATION N/A 09/05/2015   Procedure: Left Heart Cath and Coronary Angiography;  Surgeon: Troy Sine, MD;  Location: [facility];  Service: Cardiovascular;  Laterality: N/A;  . CARDIAC CATHETERIZATION N/A 09/05/2015   Procedure: Coronary Stent Intervention;  Surgeon: Troy Sine, MD;  Location: [facility];  Service: Cardiovascular;  Laterality: N/A;  . IR IMAGING GUIDED PORT INSERTION  07/15/2019  . 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 PERIPHERAL VASCULAR CATHETERIZATION Bilateral 2003   "stenting"  . TONSILLECTOMY  ~ 1963   Social History   Socioeconomic History  . Marital status: Married    Spouse name: Not on file  . Number of children: 1  . Years of education: Not on file  . Highest education level: Not on file  Occupational History  . Occupation: Acupuncturist  Tobacco Use  . Smoking status: Former Smoker    Packs/day: 2.00    Years: 35.00    Pack years: 70.00    Types: Cigarettes    Quit date: 02/19/2003    Years since quitting: 16.8  . Smokeless tobacco: Never Used  Vaping Use  . Vaping Use: Never used  Substance and Sexual Activity  . Alcohol use: Not Currently    Alcohol/week: 0.0 standard drinks    Comment: 09/04/2015  "nothing since 1980s"  . 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 Sexual activity: Not Currently  Other Topics Concern  . Not on file  Social History Narrative  . Not on file   Social Determinants of Health   Financial Resource Strain:   . Difficulty of Paying Living Expenses:   Food Insecurity:   . Worried About Charity fundraiser in the Last Year:   . Arboriculturist in the Last Year:   Transportation Needs:   . Film/video editor (Medical):   [name] Lack of Transportation (Non-Medical):   Physical Activity:   . Days of Exercise per Week:   . Minutes of Exercise per Session:   Stress:   . Feeling of Stress :   Social Connections:   . Frequency of Communication with Friends and Family:   . Frequency of Social Gatherings with Friends and Family:   . Attends Religious Services:   . Active Member of Clubs or Organizations:   . Attends Archivist Meetings:   [name] Marital Status:   Intimate Partner Violence:   . Fear of Current or Ex-Partner:   . Emotionally Abused:   [name] Physically Abused:   . Sexually Abused:    Family History  Problem Relation Age of Onset  . Lymphoma Mother   . Cancer - Prostate Father   . CAD Neg Hx   . Colon cancer  Neg Hx   . Rectal cancer Neg Hx   . Stomach cancer Neg Hx   . Esophageal cancer Neg Hx     Past Medical History:  Diagnosis Date  . Arthritis    "right knee" (09/04/2015)  . Atrial flutter with rapid ventricular response ([place]) 04/19/2019  . Dizziness 10/2019  . Dyspnea    increased exertion  . HTN (hypertension)   . Hyperlipidemia   . Lung cancer ([place]) 05/2019  . NSTEMI (non-ST elevated myocardial infarction) ([place]) 09/04/2015   a. cath 09/05/2015: 95% stenosis mid-LAD, 55% mid-RCA, and 40% prox-RCA. PCI performed w/ Synergy DES to LAD  . PAD (peripheral artery disease) ([facility])    Stenting of bilateral iliacs in 2003.  [name] Refusal of blood transfusions as patient is Jehovah's Witness   .  Type II diabetes mellitus ([facility])    No Known Allergies    Current Outpatient Medications on File Prior to Visit  Medication Sig Dispense Refill  . acetaminophen (TYLENOL) 500 MG tablet Take 500 mg by mouth every 6 (six) hours as needed for moderate pain or fever.     [name] aspirin EC 81 MG tablet Take 81 mg by mouth at bedtime.    . cetirizine (ZYRTEC) 10 MG tablet TAKE 1 TABLET BY MOUTH DAILY (Patient taking differently: Take 10 mg by mouth daily as needed for allergies. ) 30 tablet 0  . clopidogrel (PLAVIX) 75 MG tablet Take 1 tablet (75 mg total) by mouth daily. 90 tablet 3  . docusate sodium (COLACE) 100 MG capsule Take 1 capsule (100 mg total) by mouth 2 (two) times daily as needed for mild constipation. 60 capsule 11  . ferrous sulfate 324 MG TBEC Take 1 tablet (324 mg total) by mouth daily with breakfast. 30 tablet 11  . folic acid (FOLVITE) 1 MG tablet TAKE 1 TABLET BY MOUTH EVERY DAY 90 tablet 1  . glimepiride (AMARYL) 4 MG tablet TAKE 1 TABLET BY MOUTH EVERY DAY 90 tablet 1  . ipratropium (ATROVENT) 0.03 % nasal spray USE 2 SPRAYS IN EACH NOSTRIL TWICE DAILY (Patient taking differently: Place 1 spray into both nostrils daily as needed (allergies). ) 30 mL 2  . lactulose (CHRONULAC) 10 GM/15ML solution Take 30 mLs (20 g total) by mouth 2 (two) times daily as needed for moderate constipation. 240 mL 11  . lidocaine-prilocaine (EMLA) cream APPLY TO THE PORT-A-CATH SITE 30-60 MINUTES BEFORE TREATMENT. (Patient taking differently: Apply 1 application topically daily as needed (access port). Apply to the Port-A-Cath site 30-60 minutes before treatment.) 30 g 0  . lisinopril (ZESTRIL) 10 MG tablet TAKE 1 TABLET BY MOUTH EVERY DAY 90 tablet 1  . meclizine (ANTIVERT) 25 MG tablet Take 1 tablet (25 mg total) by mouth 3 (three) times daily as needed for dizziness. (Patient taking differently: Take 25 mg by mouth 3 (three) times daily. ) 90 tablet 3  . metFORMIN (GLUCOPHAGE) 1000 MG tablet TAKE 1 TABLET  (1,000 MG TOTAL) BY MOUTH 2 (TWO) TIMES DAILY WITH A MEAL. 180 tablet 1  . multivitamin (ONE-A-DAY MEN'S) TABS tablet Take 1 tablet by mouth daily. 30 tablet 6  . oxyCODONE-acetaminophen (PERCOCET/ROXICET) 5-325 MG tablet Take 1 tablet by mouth every 6 (six) hours as needed for severe pain. 30 tablet 0  . pantoprazole (PROTONIX) 40 MG tablet TAKE 1 TABLET BY MOUTH EVERY DAY 30 tablet 5  . prochlorperazine (COMPAZINE) 10 MG tablet Take 1 tablet (10 mg total) by mouth every 6 (six) hours as needed for  nausea or vomiting. 30 tablet 1  . rosuvastatin (CRESTOR) 40 MG tablet TAKE 1 TABLET BY MOUTH DAILY 90 tablet 3  . carvedilol (COREG) 12.5 MG tablet Take 1 tablet (12.5 mg total) by mouth 2 (two) times daily with a meal. (Patient taking differently: Take 12.5 mg by mouth at bedtime. ) 180 tablet 3  . Hydrocortisone Acetate 1 % CREA Apply 15 g topically 2 (two) times daily. (Patient not taking: Reported on 11/26/2019) 15 g 3  . nitroGLYCERIN (NITROSTAT) 0.4 MG SL tablet Place 1 tablet (0.4 mg total) under the tongue every 5 (five) minutes x 3 doses as needed for chest pain. (Patient not taking: Reported on 12/31/2019) 25 tablet 2  . sucralfate (CARAFATE) 1 GM/10ML suspension Take 10 mLs (1 g total) by mouth 4 (four) times daily. (Patient not taking: Reported on 12/25/2019) 420 mL 1   No current facility-administered medications on file prior to visit.    Patient Active Problem List   Diagnosis Date Noted  . Port-A-Cath in place 12/29/2019  . Metastatic non-small cell lung cancer ([place]) 07/07/2019  . Encounter for antineoplastic chemotherapy 07/07/2019  . Encounter for antineoplastic immunotherapy 07/07/2019  . Goals of care, counseling/discussion 06/28/2019  . Adenocarcinoma of right lung, stage 4 ([place]) 06/28/2019  . Cancer associated pain 06/28/2019  . CAD (coronary artery disease) 06/08/2019  . Mediastinal lymphadenopathy 06/03/2019  . Liver metastasis ([place]) 06/03/2019  . Hemoglobin A1C between 7%  and 9% indicating borderline diabetic control 05/10/2019  . COVID-19 virus infection 05/10/2019  . Atrial flutter with rapid ventricular response ([place]) 04/19/2019  . Chest pain with moderate risk for cardiac etiology 04/19/2019  . Paroxysmal atrial flutter ([place]) 04/19/2019  . Chest congestion 07/10/2018  . Cough 07/10/2018  . NSTEMI (non-ST elevated myocardial infarction) ([place]) 09/04/2015  . PVD (peripheral vascular disease) ([place]) 06/08/2015  . DDD (degenerative disc disease), cervical 06/08/2015  . Hyperlipidemia 06/08/2015  . Diabetes mellitus with nephropathy ([place]) 06/08/2015  . Diabetes mellitus with peripheral vascular disease ([place]) 06/08/2015  . Annual physical exam 01/03/2015  . PAD (peripheral artery disease) ([place]) 02/18/2013  . Diabetes ([place]) 02/18/2013  . HTN (hypertension) 02/18/2013   Current Status: Since her last office visit, he has had is doing well with no complaints. He continues to have chemotherapy treatment for Lung Cancer 05/2019. He is tolerating treatment fairly well, with minimal adverse side effects. He is experiencing dizziness, rhinorrhea, and nausea often. He does take anti-emetic as needed and meclizine, but symptoms persist. He denies visual changes, chest pain, cough, shortness of breath, heart palpitations, and falls. He has occasional headaches with position changes. Denies severe headaches, confusion, seizures, double vision, and blurred vision, and vomiting. His is currently undergoing chemotherapy treatments for Lung Cancer. He states that he is  tolerating treatments well. His anxiety is mild today r/t his present healthcare status. He denies fevers, chills, fatigue, recent infections, weight loss, and night sweats. Denies GI problems such as diarrhea. He has no reports of blood in stools, dysuria and hematuria. He is taking all medications as prescribed. He denies pain today.   Observations/Objective:  Telephone Visit  Assessment and Plan:  1. Hospital  discharge follow-up  2. Dizziness - Ambulatory referral to ENT  3. Rhinorrhea  4. Nausea  5. Metastatic non-small cell lung cancer ([place]) Continue chemotherapy treatments.   6. Essential hypertension He will continue to take medications as prescribed, to decrease high sodium intake, excessive alcohol intake, increase potassium intake, smoking cessation, and increase physical activity of  at least 30 minutes of cardio activity daily. He will continue to follow Heart Healthy or DASH diet.  7. Follow up He will keep follow up appointment.   No orders of the defined types were placed in this encounter.  Orders Placed This Encounter  Procedures  . Ambulatory referral to ENT     Referral Orders     Ambulatory referral to ENT   Kathe Becton,  MSN, FNP-BC [facility] [place] [address] [place], [place] [address] [phone]   I discussed the assessment and treatment plan with the patient. The patient was provided an opportunity to ask questions and all were answered. The patient agreed with the plan and demonstrated an understanding of the instructions.   The patient was advised to call back or seek an in-person evaluation if the symptoms worsen or if the condition fails to improve as anticipated.  I provided 20 minutes of non-face-to-face time during this encounter.   [name], FNP

## 2020-01-05 ENCOUNTER — Telehealth: Payer: Self-pay | Admitting: Family Medicine

## 2020-01-05 NOTE — Telephone Encounter (Signed)
Called and spoke with Dr. Pollie Friar office (ENT) they have the referral and will contact patient as soon as possible to schedule. Thanks!

## 2020-01-05 NOTE — Telephone Encounter (Signed)
Called and made patient aware. Thanks!

## 2020-01-06 ENCOUNTER — Encounter: Payer: Self-pay | Admitting: Family Medicine

## 2020-01-09 ENCOUNTER — Other Ambulatory Visit: Payer: Self-pay | Admitting: Family Medicine

## 2020-01-09 DIAGNOSIS — E1151 Type 2 diabetes mellitus with diabetic peripheral angiopathy without gangrene: Secondary | ICD-10-CM

## 2020-01-09 DIAGNOSIS — E7800 Pure hypercholesterolemia, unspecified: Secondary | ICD-10-CM

## 2020-01-09 DIAGNOSIS — I10 Essential (primary) hypertension: Secondary | ICD-10-CM

## 2020-01-09 MED ORDER — LISINOPRIL 10 MG PO TABS: 10.0000 mg | ORAL_TABLET | Freq: Every day | ORAL | 3 refills | Status: DC

## 2020-01-09 MED ORDER — GLIMEPIRIDE 4 MG PO TABS: 4.0000 mg | ORAL_TABLET | Freq: Every day | ORAL | 3 refills | Status: DC

## 2020-01-17 ENCOUNTER — Ambulatory Visit (HOSPITAL_COMMUNITY)
Admission: RE | Admit: 2020-01-17 | Discharge: 2020-01-17 | Disposition: A | Discharge status: Home / Self Care | Payer: Medicare Other | Source: Ambulatory Visit | Attending: Internal Medicine | Admitting: Internal Medicine

## 2020-01-17 ENCOUNTER — Other Ambulatory Visit: Payer: Self-pay

## 2020-01-17 DIAGNOSIS — I2510 Atherosclerotic heart disease of native coronary artery without angina pectoris: Secondary | ICD-10-CM | POA: Insufficient documentation

## 2020-01-17 DIAGNOSIS — C3490 Malignant neoplasm of unspecified part of unspecified bronchus or lung: Secondary | ICD-10-CM | POA: Insufficient documentation

## 2020-01-17 DIAGNOSIS — J439 Emphysema, unspecified: Secondary | ICD-10-CM | POA: Insufficient documentation

## 2020-01-17 DIAGNOSIS — C787 Secondary malignant neoplasm of liver and intrahepatic bile duct: Secondary | ICD-10-CM | POA: Diagnosis not present

## 2020-01-17 DIAGNOSIS — I700 Atherosclerosis of aorta: Secondary | ICD-10-CM | POA: Diagnosis not present

## 2020-01-17 DIAGNOSIS — R590 Localized enlarged lymph nodes: Secondary | ICD-10-CM | POA: Insufficient documentation

## 2020-01-17 MED ORDER — SODIUM CHLORIDE (PF) 0.9 % IJ SOLN
INTRAMUSCULAR | Status: AC
  Filled 2020-01-17: qty 50

## 2020-01-17 MED ORDER — IOHEXOL 300 MG/ML  SOLN
100.0000 mL | Freq: Once | INTRAMUSCULAR | Status: AC | PRN
  Administered 2020-01-17: 100 mL via INTRAVENOUS

## 2020-01-18 ENCOUNTER — Inpatient Hospital Stay: Payer: Medicare Other

## 2020-01-18 ENCOUNTER — Inpatient Hospital Stay (HOSPITAL_BASED_OUTPATIENT_CLINIC_OR_DEPARTMENT_OTHER): Payer: Medicare Other | Admitting: Internal Medicine

## 2020-01-18 ENCOUNTER — Encounter: Payer: Self-pay | Admitting: Internal Medicine

## 2020-01-18 ENCOUNTER — Other Ambulatory Visit: Payer: Self-pay

## 2020-01-18 ENCOUNTER — Telehealth: Payer: Self-pay | Admitting: Internal Medicine

## 2020-01-18 VITALS — BP 143/57 | HR 96 | Temp 97.8°F | Resp 18 | Ht 75.0 in | Wt 218.2 lb

## 2020-01-18 DIAGNOSIS — R5382 Chronic fatigue, unspecified: Secondary | ICD-10-CM

## 2020-01-18 DIAGNOSIS — I10 Essential (primary) hypertension: Secondary | ICD-10-CM

## 2020-01-18 DIAGNOSIS — Z5111 Encounter for antineoplastic chemotherapy: Secondary | ICD-10-CM | POA: Diagnosis not present

## 2020-01-18 DIAGNOSIS — I2510 Atherosclerotic heart disease of native coronary artery without angina pectoris: Secondary | ICD-10-CM

## 2020-01-18 DIAGNOSIS — Z5112 Encounter for antineoplastic immunotherapy: Secondary | ICD-10-CM

## 2020-01-18 DIAGNOSIS — G893 Neoplasm related pain (acute) (chronic): Secondary | ICD-10-CM

## 2020-01-18 DIAGNOSIS — C787 Secondary malignant neoplasm of liver and intrahepatic bile duct: Secondary | ICD-10-CM | POA: Diagnosis not present

## 2020-01-18 DIAGNOSIS — C3490 Malignant neoplasm of unspecified part of unspecified bronchus or lung: Secondary | ICD-10-CM

## 2020-01-18 DIAGNOSIS — C3491 Malignant neoplasm of unspecified part of right bronchus or lung: Secondary | ICD-10-CM

## 2020-01-18 DIAGNOSIS — Z95828 Presence of other vascular implants and grafts: Secondary | ICD-10-CM

## 2020-01-18 DIAGNOSIS — Z23 Encounter for immunization: Secondary | ICD-10-CM

## 2020-01-18 LAB — CMP (CANCER CENTER ONLY)
ALT: 33 U/L (ref 0–44)
AST: 35 U/L (ref 15–41)
Albumin: 3.6 g/dL (ref 3.5–5.0)
Alkaline Phosphatase: 66 U/L (ref 38–126)
Anion gap: 11 (ref 5–15)
BUN: 21 mg/dL (ref 8–23)
CO2: 23 mmol/L (ref 22–32)
Calcium: 9.9 mg/dL (ref 8.9–10.3)
Chloride: 104 mmol/L (ref 98–111)
Creatinine: 1.5 mg/dL — ABNORMAL HIGH (ref 0.61–1.24)
GFR, Est AFR Am: 56 mL/min — ABNORMAL LOW (ref >60)
GFR, Estimated: 48 mL/min — ABNORMAL LOW (ref >60)
Glucose, Bld: 220 mg/dL — ABNORMAL HIGH (ref 70–99)
Potassium: 4.4 mmol/L (ref 3.5–5.1)
Sodium: 138 mmol/L (ref 135–145)
Total Bilirubin: <0.2 mg/dL — ABNORMAL LOW (ref 0.3–1.2)
Total Protein: 8 g/dL (ref 6.5–8.1)

## 2020-01-18 LAB — CBC WITH DIFFERENTIAL (CANCER CENTER ONLY)
Abs Immature Granulocytes: 0.01 10*3/uL (ref 0.00–0.07)
Basophils Absolute: 0.1 10*3/uL (ref 0.0–0.1)
Basophils Relative: 1 %
Eosinophils Absolute: 0.1 10*3/uL (ref 0.0–0.5)
Eosinophils Relative: 2 %
HCT: 28.5 % — ABNORMAL LOW (ref 39.0–52.0)
Hemoglobin: 9 g/dL — ABNORMAL LOW (ref 13.0–17.0)
Immature Granulocytes: 0 %
Lymphocytes Relative: 34 %
Lymphs Abs: 2.4 10*3/uL (ref 0.7–4.0)
MCH: 31.9 pg (ref 26.0–34.0)
MCHC: 31.6 g/dL (ref 30.0–36.0)
MCV: 101.1 fL — ABNORMAL HIGH (ref 80.0–100.0)
Monocytes Absolute: 0.9 10*3/uL (ref 0.1–1.0)
Monocytes Relative: 13 %
Neutro Abs: 3.6 10*3/uL (ref 1.7–7.7)
Neutrophils Relative %: 50 %
Platelet Count: 363 10*3/uL (ref 150–400)
RBC: 2.82 MIL/uL — ABNORMAL LOW (ref 4.22–5.81)
RDW: 17 % — ABNORMAL HIGH (ref 11.5–15.5)
WBC Count: 7 10*3/uL (ref 4.0–10.5)
nRBC: 0 % (ref 0.0–0.2)

## 2020-01-18 LAB — TSH: TSH: 0.513 u[IU]/mL (ref 0.320–4.118)

## 2020-01-18 MED ORDER — SODIUM CHLORIDE 0.9% FLUSH
10.0000 mL | Freq: Once | INTRAVENOUS | Status: AC
  Administered 2020-01-18: 10 mL
  Filled 2020-01-18: qty 10

## 2020-01-18 MED ORDER — SODIUM CHLORIDE 0.9 % IV SOLN
500.0000 mg/m2 | Freq: Once | INTRAVENOUS | Status: AC
  Administered 2020-01-18: 1100 mg via INTRAVENOUS
  Filled 2020-01-18: qty 40

## 2020-01-18 MED ORDER — PROCHLORPERAZINE MALEATE 10 MG PO TABS
ORAL_TABLET | ORAL | Status: AC
  Filled 2020-01-18: qty 1

## 2020-01-18 MED ORDER — PROCHLORPERAZINE MALEATE 10 MG PO TABS
10.0000 mg | ORAL_TABLET | Freq: Once | ORAL | Status: AC
  Administered 2020-01-18: 10 mg via ORAL

## 2020-01-18 MED ORDER — SODIUM CHLORIDE 0.9% FLUSH
10.0000 mL | INTRAVENOUS | Status: DC | PRN
  Filled 2020-01-18: qty 10

## 2020-01-18 MED ORDER — HEPARIN SOD (PORK) LOCK FLUSH 100 UNIT/ML IV SOLN
500.0000 [IU] | Freq: Once | INTRAVENOUS | Status: DC | PRN
  Filled 2020-01-18: qty 5

## 2020-01-18 MED ORDER — SODIUM CHLORIDE 0.9 % IV SOLN
200.0000 mg | Freq: Once | INTRAVENOUS | Status: AC
  Administered 2020-01-18: 200 mg via INTRAVENOUS
  Filled 2020-01-18: qty 8

## 2020-01-18 MED ORDER — SODIUM CHLORIDE 0.9 % IV SOLN
Freq: Once | INTRAVENOUS | Status: AC
  Filled 2020-01-18: qty 250

## 2020-01-18 NOTE — Progress Notes (Signed)
   Covid-19 Vaccination Clinic  Name:  Kirk [name]    MRN: [id number] DOB: [birth year]  01/18/2020  Kirk [name] was observed post Covid-19 immunization for 15 minutes without incident. He was provided with Vaccine Information Sheet and instruction to access the V-Safe system.   Kirk [name] was instructed to call 911 with any severe reactions post vaccine: [name] Difficulty breathing  . Swelling of face and throat  . A fast heartbeat  . A bad rash all over body  . Dizziness and weakness   Immunizations Administered    Name Date Dose VIS Date Route   Pfizer COVID-19 Vaccine 01/18/2020  1:11 PM 0.3 mL 07/21/2018 Intramuscular   Manufacturer: [place]   Lot: [id number]   [place]: [id number]

## 2020-01-18 NOTE — Telephone Encounter (Signed)
Scheduled per 8/24 los. Noted to give pt appt calendar during treatment.

## 2020-01-18 NOTE — Progress Notes (Signed)
[facility] [phone]   [phone]  OFFICE PROGRESS NOTE  Kirk Glatter, FNP [name] [address]  DIAGNOSIS: Stage IV (TX, N3, M1c) non-small cell lung cancer, poorly differentiated adenocarcinoma presented with bulky mediastinal lymphadenopathy in addition to right axillary lymphadenopathy and metastatic liver lesions, abdominal lymphadenopathy diagnosed in January 2021. Molecular studies by [employer] that shows no actionable mutations.  PRIOR THERAPY: None  CURRENT THERAPY: Systemic chemotherapy with carboplatin for AUC of 5, Alimta 500 mg/M2 and Keytruda 200 mg IV every 3 weeks.  First dose July 14, 2019.  Status post 9 cycles. Starting from cycle #5 the patient will be treated with maintenance Alimta and Keytruda every 3 weeks.  INTERVAL HISTORY: Kirk [name] 64 y.o. male returns to the clinic today for follow-up visit.  The patient is feeling fine today with no concerning complaints except for the dizzy spells.  He had MRI of the brain recently that was unremarkable for metastatic disease to the brain.  He has an appointment with ENT in early September for evaluation of his dizzy spells.  He denied having any current chest pain, shortness of breath, cough or hemoptysis.  He denied having any nausea, vomiting, diarrhea or constipation.  He has no headache or visual changes.  The patient continues to tolerate his maintenance treatment with Alimta and Keytruda fairly well.  He had repeat CT scan of the chest, abdomen pelvis performed recently and he is here for evaluation and discussion of his discuss results.  MEDICAL HISTORY: Past Medical History:  Diagnosis Date  . Arthritis    "right knee" (09/04/2015)  . Atrial flutter with rapid ventricular response ([facility]) 04/19/2019  . Dizziness 10/2019  . Dyspnea    increased exertion  . HTN (hypertension)   . Hyperlipidemia   . Lung cancer ([facility]) 05/2019  . NSTEMI  (non-ST elevated myocardial infarction) ([facility]) 09/04/2015   a. cath 09/05/2015: 95% stenosis mid-LAD, 55% mid-RCA, and 40% prox-RCA. PCI performed w/ Synergy DES to LAD  . PAD (peripheral artery disease) ([facility])    Stenting of bilateral iliacs in 2003.  [name] Refusal of blood transfusions as patient is Jehovah's Witness   . Type II diabetes mellitus ([facility])     ALLERGIES:  has No Known Allergies.  MEDICATIONS:  Current Outpatient Medications  Medication Sig Dispense Refill  . acetaminophen (TYLENOL) 500 MG tablet Take 500 mg by mouth every 6 (six) hours as needed for moderate pain or fever.     [name] aspirin EC 81 MG tablet Take 81 mg by mouth at bedtime.    . carvedilol (COREG) 12.5 MG tablet Take 1 tablet (12.5 mg total) by mouth 2 (two) times daily with a meal. (Patient taking differently: Take 12.5 mg by mouth at bedtime. ) 180 tablet 3  . cetirizine (ZYRTEC) 10 MG tablet TAKE 1 TABLET BY MOUTH DAILY (Patient taking differently: Take 10 mg by mouth daily as needed for allergies. ) 30 tablet 0  . clopidogrel (PLAVIX) 75 MG tablet Take 1 tablet (75 mg total) by mouth daily. 90 tablet 3  . docusate sodium (COLACE) 100 MG capsule Take 1 capsule (100 mg total) by mouth 2 (two) times daily as needed for mild constipation. 60 capsule 11  . ferrous sulfate 324 MG TBEC Take 1 tablet (324 mg total) by mouth daily with breakfast. 30 tablet 11  . folic acid (FOLVITE) 1 MG tablet TAKE 1 TABLET BY MOUTH EVERY DAY 90  tablet 1  . glimepiride (AMARYL) 4 MG tablet Take 1 tablet (4 mg total) by mouth daily. 90 tablet 3  . Hydrocortisone Acetate 1 % CREA Apply 15 g topically 2 (two) times daily. (Patient not taking: Reported on 11/26/2019) 15 g 3  . ipratropium (ATROVENT) 0.03 % nasal spray USE 2 SPRAYS IN EACH NOSTRIL TWICE DAILY (Patient taking differently: Place 1 spray into both nostrils daily as needed (allergies). ) 30 mL 2  . lactulose (CHRONULAC) 10 GM/15ML solution Take 30 mLs (20 g total) by mouth 2 (two) times  daily as needed for moderate constipation. 240 mL 11  . lidocaine-prilocaine (EMLA) cream APPLY TO THE PORT-A-CATH SITE 30-60 MINUTES BEFORE TREATMENT. (Patient taking differently: Apply 1 application topically daily as needed (access port). Apply to the Port-A-Cath site 30-60 minutes before treatment.) 30 g 0  . lisinopril (ZESTRIL) 10 MG tablet Take 1 tablet (10 mg total) by mouth daily. 90 tablet 3  . meclizine (ANTIVERT) 25 MG tablet Take 1 tablet (25 mg total) by mouth 3 (three) times daily as needed for dizziness. (Patient taking differently: Take 25 mg by mouth 3 (three) times daily. ) 90 tablet 3  . metFORMIN (GLUCOPHAGE) 1000 MG tablet TAKE 1 TABLET (1,000 MG TOTAL) BY MOUTH 2 (TWO) TIMES DAILY WITH A MEAL. 180 tablet 1  . multivitamin (ONE-A-DAY MEN'S) TABS tablet Take 1 tablet by mouth daily. 30 tablet 6  . nitroGLYCERIN (NITROSTAT) 0.4 MG SL tablet Place 1 tablet (0.4 mg total) under the tongue every 5 (five) minutes x 3 doses as needed for chest pain. (Patient not taking: Reported on 12/31/2019) 25 tablet 2  . oxyCODONE-acetaminophen (PERCOCET/ROXICET) 5-325 MG tablet Take 1 tablet by mouth every 6 (six) hours as needed for severe pain. 30 tablet 0  . pantoprazole (PROTONIX) 40 MG tablet TAKE 1 TABLET BY MOUTH EVERY DAY 30 tablet 5  . prochlorperazine (COMPAZINE) 10 MG tablet Take 1 tablet (10 mg total) by mouth every 6 (six) hours as needed for nausea or vomiting. 30 tablet 1  . rosuvastatin (CRESTOR) 40 MG tablet TAKE 1 TABLET BY MOUTH DAILY 90 tablet 3  . sucralfate (CARAFATE) 1 GM/10ML suspension Take 10 mLs (1 g total) by mouth 4 (four) times daily. (Patient not taking: Reported on 12/25/2019) 420 mL 1   No current facility-administered medications for this visit.    SURGICAL HISTORY:  Past Surgical History:  Procedure Laterality Date  . CARDIAC CATHETERIZATION N/A 09/05/2015   Procedure: Left Heart Cath and Coronary Angiography;  Surgeon: Troy Sine, MD;  Location: [facility];  Service: Cardiovascular;  Laterality: N/A;  . CARDIAC CATHETERIZATION N/A 09/05/2015   Procedure: Coronary Stent Intervention;  Surgeon: Troy Sine, MD;  Location: [facility];  Service: Cardiovascular;  Laterality: N/A;  . IR IMAGING GUIDED PORT INSERTION  07/15/2019  . 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 PERIPHERAL VASCULAR CATHETERIZATION Bilateral 2003   "stenting"  . TONSILLECTOMY  ~ 1963    REVIEW OF SYSTEMS:  Constitutional: negative Eyes: negative Ears, nose, mouth, throat, and face: negative Respiratory: negative Cardiovascular: negative Gastrointestinal: negative Genitourinary:negative Integument/breast: negative Hematologic/lymphatic: negative Musculoskeletal:negative Neurological: positive for dizziness Behavioral/Psych: negative Endocrine: negative Allergic/Immunologic: negative   PHYSICAL EXAMINATION: General appearance: alert, cooperative, appears stated age, fatigued and no distress Head: Normocephalic, without obvious abnormality, atraumatic Neck: no adenopathy, no JVD, supple, symmetrical, trachea midline and thyroid not enlarged, symmetric, no tenderness/mass/nodules Lymph nodes: Cervical,  supraclavicular, and axillary nodes normal. Resp: clear to auscultation bilaterally Back: symmetric, no curvature. ROM normal. No CVA tenderness. Cardio: regular rate and rhythm, S1, S2 normal, no murmur, click, rub or gallop GI: soft, non-tender; bowel sounds normal; no masses,  no organomegaly Extremities: extremities normal, atraumatic, no cyanosis or edema Neurologic: Alert and oriented X 3, normal strength and tone. Normal symmetric reflexes. Normal coordination and gait  ECOG PERFORMANCE STATUS: 1 - Symptomatic but completely ambulatory  Blood pressure (!) 143/57, pulse 96, temperature 97.8 F (36.6 C), temperature source Tympanic, resp. rate 18, height 6\' 3"  (1.905 m), weight 218 lb 3.2 oz (99 kg), SpO2 100  %.  LABORATORY DATA: Lab Results  Component Value Date   WBC 7.0 01/18/2020   HGB 9.0 (L) 01/18/2020   HCT 28.5 (L) 01/18/2020   MCV 101.1 (H) 01/18/2020   PLT 363 01/18/2020      Chemistry      Component Value Date/Time   NA 140 12/29/2019 1108   NA 140 06/08/2015 0840   K 4.4 12/29/2019 1108   CL 106 12/29/2019 1108   CO2 23 12/29/2019 1108   BUN 23 12/29/2019 1108   BUN 15 06/08/2015 0840   CREATININE 1.43 (H) 12/29/2019 1108   CREATININE 1.06 10/01/2016 0855      Component Value Date/Time   CALCIUM 9.8 12/29/2019 1108   ALKPHOS 74 12/29/2019 1108   AST 24 12/29/2019 1108   ALT 27 12/29/2019 1108   BILITOT <0.2 (L) 12/29/2019 1108       RADIOGRAPHIC STUDIES: CT Head Wo Contrast  Result Date: 12/25/2019 CLINICAL DATA:  Dizziness EXAM: CT HEAD WITHOUT CONTRAST TECHNIQUE: Contiguous axial images were obtained from the base of the skull through the vertex without intravenous contrast. COMPARISON:  None FINDINGS: Brain: No evidence of acute territorial infarction, hemorrhage, hydrocephalus,extra-axial collection or mass lesion/mass effect. There is mild dilatation the ventricles and sulci consistent with age-related atrophy. Low-attenuation changes in the deep white matter consistent with small vessel ischemia. Vascular: No hyperdense vessel or unexpected calcification. Skull: The skull is intact. No fracture or focal lesion identified. Sinuses/Orbits: The visualized paranasal sinuses and mastoid air cells are clear. The orbits and globes intact. Other: None IMPRESSION: No acute intracranial abnormality. Findings consistent with age related atrophy and chronic small vessel ischemia Electronically Signed   By: Prudencio Pair M.D.   On: 12/25/2019 19:31   CT Chest W Contrast  Result Date: 01/17/2020 CLINICAL DATA:  Non-small cell lung cancer with ongoing chemotherapy. Restaging. EXAM: CT CHEST, ABDOMEN, AND PELVIS WITH CONTRAST TECHNIQUE: Multidetector CT imaging of the chest,  abdomen and pelvis was performed following the standard protocol during bolus administration of intravenous contrast. CONTRAST:  122mL OMNIPAQUE IOHEXOL 300 MG/ML  SOLN COMPARISON:  11/15/2019 CT chest, abdomen and pelvis. FINDINGS: CT CHEST FINDINGS Cardiovascular: Normal heart size. Stable trace pericardial effusion/thickening. Three-vessel coronary atherosclerosis. Right internal jugular Port-A-Cath terminates at the cavoatrial junction. Atherosclerotic nonaneurysmal thoracic aorta. Top-normal caliber main pulmonary artery (3.1 cm diameter). No central pulmonary emboli. Mediastinum/Nodes: No discrete thyroid nodules. Unremarkable esophagus. No pathologically enlarged axillary lymph nodes. Enlarged 1.5 cm subcarinal node (series 2/image 33), decreased from 1.7 cm on 11/15/2019 CT using similar measurement technique. No additional pathologically enlarged mediastinal nodes. No pathologically enlarged hilar nodes. Lungs/Pleura: No pneumothorax. No pleural effusion. Moderate centrilobular emphysema with diffuse bronchial wall thickening. No acute consolidative airspace disease, lung masses or significant pulmonary nodules. Musculoskeletal: No aggressive appearing focal osseous lesions. Mild thoracic spondylosis. CT ABDOMEN PELVIS FINDINGS  Hepatobiliary: Normal liver size. Stable 1.9 cm segment 5 right liver hemangioma. Hypodense 1.1 cm segment 4A left liver lesion (series 2/image 54), previously 1.1 cm, stable. Hypodense 0.6 cm segment 8 right liver dome lesion (series 2/image 53), previously 0.7 cm, slightly decreased. No new liver lesions. Normal gallbladder with no radiopaque cholelithiasis. No biliary ductal dilatation. Pancreas: Normal, with no mass or duct dilation. Spleen: Normal size. No mass. Adrenals/Urinary Tract: Normal adrenals. No hydronephrosis. No renal masses. Stable heterogeneous parenchymal enhancement throughout both kidneys without significant perinephric fat stranding. Normal bladder.  Stomach/Bowel: Normal non-distended stomach. Normal caliber small bowel with no small bowel wall thickening. Normal appendix. Oral contrast transits to the right colon. Moderate diffuse colonic stool volume. No large bowel wall thickening, diverticulosis or acute pericolonic fat stranding. Vascular/Lymphatic: Atherosclerotic nonaneurysmal abdominal aorta. Patent portal, splenic, hepatic and renal veins. Mildly enlarged 1.4 cm gastrohepatic ligament node (series 2/image 60), previously 1.6 cm, slightly decreased. Enlarged 1.4 cm porta hepatis node (series 2/image 61), previously 1.4 cm, stable. No new pathologically enlarged lymph nodes in the abdomen or pelvis. Reproductive: Top-normal size prostate. Other: No pneumoperitoneum, ascites or focal fluid collection. Musculoskeletal: No aggressive appearing focal osseous lesions. Fixation pins in the left femoral neck. Mild lumbar spondylosis. IMPRESSION: 1. No new or progressive metastatic disease. Continued positive response to therapy. Subcarinal, gastrohepatic ligament and porta hepatis lymphadenopathy is stable to slightly decreased. Liver metastases are stable to slightly decreased. 2. Stable heterogeneous parenchymal enhancement throughout both kidneys, nonspecific, suggest correlation with serum creatinine. No discrete renal mass. No hydronephrosis. 3. Moderate diffuse colonic stool volume, suggesting constipation. 4. Three-vessel coronary atherosclerosis. 5. Aortic Atherosclerosis (ICD10-I70.0) and Emphysema (ICD10-J43.9). Electronically Signed   By: Ilona Sorrel M.D.   On: 01/17/2020 09:59   MR Brain Wo Contrast (neuro protocol)  Result Date: 12/25/2019 CLINICAL DATA:  Neuro deficit, acute, stroke suspected. Additional history provided: Acute onset vertigo. Stage IV non-small cell lung cancer. EXAM: MRI HEAD WITHOUT CONTRAST TECHNIQUE: Multiplanar, multiecho pulse sequences of the brain and surrounding structures were obtained without intravenous contrast.  COMPARISON:  Noncontrast head CT 12/25/2019., brain MRI 07/26/2019. FINDINGS: Brain: Please note there is limited assessment for intracranial metastatic disease on this noncontrast examination. Cerebral volume is normal for age. No focal parenchymal signal abnormality is identified. There is no acute infarct. No evidence of intracranial mass. No chronic intracranial blood products. No extra-axial fluid collection. No midline shift. Vascular: Expected proximal arterial flow voids. Skull and upper cervical spine: Redemonstrated 7 mm lesion within the left parietal calvarium demonstrating restricted diffusion. This lesion demonstrated enhancement on the prior MRI of 07/26/2019 and is suspicious for a osseous metastatic lesion. Redemonstrated subcentimeter nonspecific T2/FLAIR hyperintense lesion within the right parietal scalp with associated restricted diffusion, nonspecific but possibly reflecting an epidermoid cyst. Sinuses/Orbits: Visualized orbits show no acute finding. Mild ethmoid and maxillary sinus mucosal thickening. Small left maxillary sinus mucous retention cyst. Trace right mastoid effusion IMPRESSION: Please note there is limited assessment for intracranial metastatic disease on this noncontrast MRI. No noncontrast evidence of acute intracranial abnormality, including acute infarction. Unchanged 7 mm lesion within the left parietal calvarium concerning for an osseous metastasis. Mild ethmoid and maxillary sinus mucosal thickening. Small left maxillary sinus mucous retention cyst. Trace right mastoid effusion. Electronically Signed   By: Kellie Simmering DO   On: 12/25/2019 22:28   CT Abdomen Pelvis W Contrast  Result Date: 01/17/2020 CLINICAL DATA:  Non-small cell lung cancer with ongoing chemotherapy. Restaging. EXAM: CT CHEST, ABDOMEN, AND PELVIS  WITH CONTRAST TECHNIQUE: Multidetector CT imaging of the chest, abdomen and pelvis was performed following the standard protocol during bolus administration  of intravenous contrast. CONTRAST:  175mL OMNIPAQUE IOHEXOL 300 MG/ML  SOLN COMPARISON:  11/15/2019 CT chest, abdomen and pelvis. FINDINGS: CT CHEST FINDINGS Cardiovascular: Normal heart size. Stable trace pericardial effusion/thickening. Three-vessel coronary atherosclerosis. Right internal jugular Port-A-Cath terminates at the cavoatrial junction. Atherosclerotic nonaneurysmal thoracic aorta. Top-normal caliber main pulmonary artery (3.1 cm diameter). No central pulmonary emboli. Mediastinum/Nodes: No discrete thyroid nodules. Unremarkable esophagus. No pathologically enlarged axillary lymph nodes. Enlarged 1.5 cm subcarinal node (series 2/image 33), decreased from 1.7 cm on 11/15/2019 CT using similar measurement technique. No additional pathologically enlarged mediastinal nodes. No pathologically enlarged hilar nodes. Lungs/Pleura: No pneumothorax. No pleural effusion. Moderate centrilobular emphysema with diffuse bronchial wall thickening. No acute consolidative airspace disease, lung masses or significant pulmonary nodules. Musculoskeletal: No aggressive appearing focal osseous lesions. Mild thoracic spondylosis. CT ABDOMEN PELVIS FINDINGS Hepatobiliary: Normal liver size. Stable 1.9 cm segment 5 right liver hemangioma. Hypodense 1.1 cm segment 4A left liver lesion (series 2/image 54), previously 1.1 cm, stable. Hypodense 0.6 cm segment 8 right liver dome lesion (series 2/image 53), previously 0.7 cm, slightly decreased. No new liver lesions. Normal gallbladder with no radiopaque cholelithiasis. No biliary ductal dilatation. Pancreas: Normal, with no mass or duct dilation. Spleen: Normal size. No mass. Adrenals/Urinary Tract: Normal adrenals. No hydronephrosis. No renal masses. Stable heterogeneous parenchymal enhancement throughout both kidneys without significant perinephric fat stranding. Normal bladder. Stomach/Bowel: Normal non-distended stomach. Normal caliber small bowel with no small bowel wall  thickening. Normal appendix. Oral contrast transits to the right colon. Moderate diffuse colonic stool volume. No large bowel wall thickening, diverticulosis or acute pericolonic fat stranding. Vascular/Lymphatic: Atherosclerotic nonaneurysmal abdominal aorta. Patent portal, splenic, hepatic and renal veins. Mildly enlarged 1.4 cm gastrohepatic ligament node (series 2/image 60), previously 1.6 cm, slightly decreased. Enlarged 1.4 cm porta hepatis node (series 2/image 61), previously 1.4 cm, stable. No new pathologically enlarged lymph nodes in the abdomen or pelvis. Reproductive: Top-normal size prostate. Other: No pneumoperitoneum, ascites or focal fluid collection. Musculoskeletal: No aggressive appearing focal osseous lesions. Fixation pins in the left femoral neck. Mild lumbar spondylosis. IMPRESSION: 1. No new or progressive metastatic disease. Continued positive response to therapy. Subcarinal, gastrohepatic ligament and porta hepatis lymphadenopathy is stable to slightly decreased. Liver metastases are stable to slightly decreased. 2. Stable heterogeneous parenchymal enhancement throughout both kidneys, nonspecific, suggest correlation with serum creatinine. No discrete renal mass. No hydronephrosis. 3. Moderate diffuse colonic stool volume, suggesting constipation. 4. Three-vessel coronary atherosclerosis. 5. Aortic Atherosclerosis (ICD10-I70.0) and Emphysema (ICD10-J43.9). Electronically Signed   By: Ilona Sorrel M.D.   On: 01/17/2020 09:59    ASSESSMENT AND PLAN: This is a very pleasant 64 years old African-American male recently diagnosed with a stage IV (TX, N3, M1c) non-small cell lung cancer, poorly differentiated adenocarcinoma presented with bulky mediastinal as well as right hilar, supraclavicular, abdominal and right axillary lymphadenopathy in addition to metastatic liver lesions diagnosed in January 2021. Molecular studies by [employer]ant 360 shows no actionable mutations.  The patient is not a  candidate for treatment with targeted therapy or enrollment in the clinical trial with the [facility] regimen. The patient is currently undergoing systemic chemotherapy with carboplatin for AUC of 5, Alimta 500 mg/M2 and Keytruda 200 mg IV every 3 weeks status post 9 cycles. Starting from cycle #5 he is treated with maintenance Alimta and Keytruda every 3 weeks. The patient continues to tolerate  his treatment fairly well with no concerning adverse effects. He had repeat CT scan of the chest, abdomen pelvis performed recently.  I personally and independently reviewed the scans and discussed the results with the patient today. His scan showed no evidence for disease progression and there was some further improvement of his disease. I recommended for him to continue his current treatment with maintenance Alimta and Keytruda and he will proceed with cycle #10 today. For the chemotherapy-induced anemia, he will continue with the oral iron tablet at regular basis.  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 For pain management he will continue on Percocet on as-needed basis. For the constipation the patient will continue on MiraLAX and milk of magnesia as needed. He will come back for follow-up visit in 3 weeks for evaluation before the next cycle of his treatment. He was advised to call immediately if he has any concerning symptoms in the interval.  The patient voices understanding of current disease status and treatment options and is in agreement with the current care plan.  All questions were answered. The patient knows to call the clinic with any problems, questions or concerns. We can certainly see the patient much sooner if necessary.  Disclaimer: This note was dictated with voice recognition software. Similar sounding words can inadvertently be transcribed and may not be corrected upon review.

## 2020-01-18 NOTE — Patient Instructions (Signed)
[facility] Discharge Instructions for Patients Receiving Chemotherapy  Today you received the following chemotherapy agents: keytruda, alimta  To help prevent nausea and vomiting after your treatment, we encourage you to take your nausea medication as directed.    If you develop nausea and vomiting that is not controlled by your nausea medication, call the clinic.   BELOW ARE SYMPTOMS THAT SHOULD BE REPORTED IMMEDIATELY:  *FEVER GREATER THAN 100.5 F  *CHILLS WITH OR WITHOUT FEVER  NAUSEA AND VOMITING THAT IS NOT CONTROLLED WITH YOUR NAUSEA MEDICATION  *UNUSUAL SHORTNESS OF BREATH  *UNUSUAL BRUISING OR BLEEDING  TENDERNESS IN MOUTH AND THROAT WITH OR WITHOUT PRESENCE OF ULCERS  *URINARY PROBLEMS  *BOWEL PROBLEMS  UNUSUAL RASH Items with * indicate a potential emergency and should be followed up as soon as possible.  Feel free to call the clinic should you have any questions or concerns. The clinic phone number is [phone].  Please show the [facility] at check-in to the Emergency Department and triage nurse.

## 2020-01-26 ENCOUNTER — Ambulatory Visit (INDEPENDENT_AMBULATORY_CARE_PROVIDER_SITE_OTHER): Payer: Medicare Other | Admitting: Otolaryngology

## 2020-01-26 ENCOUNTER — Encounter (INDEPENDENT_AMBULATORY_CARE_PROVIDER_SITE_OTHER): Payer: Self-pay | Admitting: Otolaryngology

## 2020-01-26 ENCOUNTER — Other Ambulatory Visit: Payer: Self-pay

## 2020-01-26 VITALS — Temp 96.8°F

## 2020-01-26 DIAGNOSIS — J310 Chronic rhinitis: Secondary | ICD-10-CM

## 2020-01-26 DIAGNOSIS — R42 Dizziness and giddiness: Secondary | ICD-10-CM | POA: Diagnosis not present

## 2020-01-26 DIAGNOSIS — I2510 Atherosclerotic heart disease of native coronary artery without angina pectoris: Secondary | ICD-10-CM

## 2020-01-26 NOTE — Progress Notes (Signed)
HPI: Kirk [name] is a 64 y.o. male who presents for evaluation of dizziness that he has had for about 2 months now.  He has a chronic imbalance problem when he gets up and walks.  He does not really describe vertigo or spinning sensation.  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  He felt like the balance symptoms began when he was started on chemotherapy.  He also describes a lot of clear mucus discharge from his nose.  He also feels like this has been worse since taking chemotherapy.  He denies any pain or pressure in the paranasal areas.  The mucus is always clear as he has no yellow-green discharge.  He has slight imbalance when he walks in the office today. He has not noticed any hearing problems.  No ear pain or discomfort.  The balance seems better when he is in bed at night or when he is not active.  Past Medical History:  Diagnosis Date  . Arthritis    "right knee" (09/04/2015)  . Atrial flutter with rapid ventricular response ([place]) 04/19/2019  . Dizziness 10/2019  . Dyspnea    increased exertion  . HTN (hypertension)   . Hyperlipidemia   . Lung cancer ([place]) 05/2019  . NSTEMI (non-ST elevated myocardial infarction) ([place]) 09/04/2015   a. cath 09/05/2015: 95% stenosis mid-LAD, 55% mid-RCA, and 40% prox-RCA. PCI performed w/ Synergy DES to LAD  . PAD (peripheral artery disease) ([facility])    Stenting of bilateral iliacs in 2003.  [name] Refusal of blood transfusions as patient is Jehovah's Witness   . Type II diabetes mellitus ([place])    Past Surgical History:  Procedure Laterality Date  . CARDIAC CATHETERIZATION N/A 09/05/2015   Procedure: Left Heart Cath and Coronary Angiography;  Surgeon: Troy Sine, MD;  Location: [facility];  Service: Cardiovascular;  Laterality: N/A;  . CARDIAC CATHETERIZATION N/A 09/05/2015   Procedure: Coronary Stent Intervention;  Surgeon: Troy Sine, MD;  Location: [facility];  Service: Cardiovascular;  Laterality: N/A;  . IR IMAGING GUIDED PORT INSERTION  07/15/2019  . 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 PERIPHERAL VASCULAR CATHETERIZATION Bilateral 2003   "stenting"  . TONSILLECTOMY  ~ 1963   Social History   Socioeconomic History  . Marital status: Married    Spouse name: Not on file  . Number of children: 1  . Years of education: Not on file  . Highest education level: Not on file  Occupational History  . Occupation: Acupuncturist  Tobacco Use  . Smoking status: Former Smoker    Packs/day: 2.00    Years: 35.00    Pack years: 70.00    Types: Cigarettes    Quit date: 02/19/2003    Years since quitting: 16.9  . Smokeless tobacco: Never Used  Vaping Use  . Vaping Use: Never used  Substance and Sexual Activity  . Alcohol use: Not Currently    Alcohol/week: 0.0 standard drinks    Comment: 09/04/2015 "nothing since 1980s"  . 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 Sexual activity: Not Currently  Other Topics Concern  . Not on file  Social History Narrative  . Not on file   Social Determinants of Health   Financial Resource Strain:   . Difficulty of Paying Living  Expenses: Not on file  Food Insecurity:   . Worried About Charity fundraiser in the Last Year: Not on file  . Ran Out of Food in the Last Year: Not on file  Transportation Needs:   . Lack of Transportation (Medical): Not on file  . Lack of Transportation (Non-Medical): Not on file  Physical Activity:   . Days of Exercise per Week: Not on file  . Minutes of Exercise per Session: Not on file  Stress:   . Feeling of Stress : Not on file  Social Connections:   . Frequency of Communication with Friends and Family: Not on file  . Frequency of Social Gatherings with Friends and Family: Not on file  . Attends Religious Services: Not on file  . Active Member of Clubs or Organizations: Not on file  . Attends  Archivist Meetings: Not on file  . Marital Status: Not on file   Family History  Problem Relation Age of Onset  . Lymphoma Mother   . Cancer - Prostate Father   . CAD Neg Hx   . Colon cancer Neg Hx   . Rectal cancer Neg Hx   . Stomach cancer Neg Hx   . Esophageal cancer Neg Hx    No Known Allergies Prior to Admission medications   Medication Sig Start Date End Date Taking? Authorizing Provider  acetaminophen (TYLENOL) 500 MG tablet Take 500 mg by mouth every 6 (six) hours as needed for moderate pain or fever.    Yes [provider]  aspirin EC 81 MG tablet Take 81 mg by mouth at bedtime.   Yes [provider]  carvedilol (COREG) 12.5 MG tablet Take 1 tablet (12.5 mg total) by mouth 2 (two) times daily with a meal. Patient taking differently: Take 12.5 mg by mouth at bedtime.  06/09/19  Yes Imogene Burn, PA-C  cetirizine (ZYRTEC) 10 MG tablet TAKE 1 TABLET BY MOUTH DAILY Patient taking differently: Take 10 mg by mouth daily as needed for allergies.  12/13/19  Yes [name], FNP  clopidogrel (PLAVIX) 75 MG tablet Take 1 tablet (75 mg total) by mouth daily. 07/22/19  Yes Burnell Blanks, MD  docusate sodium (COLACE) 100 MG capsule Take 1 capsule (100 mg total) by mouth 2 (two) times daily as needed for mild constipation. 11/22/19  Yes [name], FNP  ferrous sulfate 324 MG TBEC Take 1 tablet (324 mg total) by mouth daily with breakfast. 11/22/19  Yes [name], FNP  folic acid (FOLVITE) 1 MG tablet TAKE 1 TABLET BY MOUTH EVERY DAY 09/29/19  Yes Curt Bears, MD  glimepiride (AMARYL) 4 MG tablet Take 1 tablet (4 mg total) by mouth daily. 01/09/20  Yes [name], FNP  Hydrocortisone Acetate 1 % CREA Apply 15 g topically 2 (two) times daily. 05/10/19  Yes [name], FNP  ipratropium (ATROVENT) 0.03 % nasal spray USE 2 SPRAYS IN EACH NOSTRIL TWICE DAILY Patient taking differently: Place 1 spray into both nostrils daily  as needed (allergies).  01/13/19  Yes [name], FNP  lactulose (CHRONULAC) 10 GM/15ML solution Take 30 mLs (20 g total) by mouth 2 (two) times daily as needed for moderate constipation. 11/22/19  Yes [name], FNP  lidocaine-prilocaine (EMLA) cream APPLY TO THE PORT-A-CATH SITE 30-60 MINUTES BEFORE TREATMENT. Patient taking differently: Apply 1 application topically daily as needed (access port). Apply to the Port-A-Cath site 30-60 minutes before treatment. 07/28/19  Yes  Curt Bears, MD  lisinopril (ZESTRIL) 10 MG tablet Take 1 tablet (10 mg total) by mouth daily. 01/09/20  Yes [name], FNP  meclizine (ANTIVERT) 25 MG tablet Take 1 tablet (25 mg total) by mouth 3 (three) times daily as needed for dizziness. Patient taking differently: Take 25 mg by mouth 3 (three) times daily.  11/26/19  Yes [name], FNP  metFORMIN (GLUCOPHAGE) 1000 MG tablet TAKE 1 TABLET (1,000 MG TOTAL) BY MOUTH 2 (TWO) TIMES DAILY WITH A MEAL. 09/13/19  Yes [name], FNP  multivitamin (ONE-A-DAY MEN'S) TABS tablet Take 1 tablet by mouth daily. 11/11/18  Yes [name], FNP  nitroGLYCERIN (NITROSTAT) 0.4 MG SL tablet Place 1 tablet (0.4 mg total) under the tongue every 5 (five) minutes x 3 doses as needed for chest pain. 08/26/17  Yes Imogene Burn, PA-C  oxyCODONE-acetaminophen (PERCOCET/ROXICET) 5-325 MG tablet Take 1 tablet by mouth every 6 (six) hours as needed for severe pain. 06/28/19  Yes Curt Bears, MD  pantoprazole (PROTONIX) 40 MG tablet TAKE 1 TABLET BY MOUTH EVERY DAY 06/04/19  Yes Armbruster, Carlota Raspberry, MD  prochlorperazine (COMPAZINE) 10 MG tablet Take 1 tablet (10 mg total) by mouth every 6 (six) hours as needed for nausea or vomiting. 09/02/19  Yes Heilingoetter, Cassandra L, PA-C  rosuvastatin (CRESTOR) 40 MG tablet TAKE 1 TABLET BY MOUTH DAILY 09/14/19  Yes Burnell Blanks, MD  sucralfate (CARAFATE) 1 GM/10ML suspension Take 10 mLs (1 g total) by mouth 4  (four) times daily. 05/13/19  Yes Armbruster, Carlota Raspberry, MD     Positive ROS: Otherwise negative  All other systems have been reviewed and were otherwise negative with the exception of those mentioned in the HPI and as above.  Physical Exam: Constitutional: Alert, well-appearing, no acute distress Ears: External ears without lesions or tenderness. Ear canals are clear bilaterally.  TMs are clear bilaterally with minimal wax buildup that was removed in the office.  On Dix-Hallpike testing he had no evidence of BPPV.  On hearing screening with a tuning forks patient had good hearing in both ears. Nasal: External nose without lesions. Septum with minimal deformity and mild rhinitis with clear mucus discharge within the nasal cavity.  Both middle meatus regions were clear with no signs of infection.  Oral: Lips and gums without lesions. Tongue and palate mucosa without lesions. Posterior oropharynx clear. Neck: No palpable adenopathy or masses Respiratory: Breathing comfortably  Skin: No facial/neck lesions or rash noted.  Procedures  Assessment: Chronic dizziness probably related to chemotherapy. On clinical exam no evidence of BPPV or inner ear abnormality. Chronic rhinitis  Plan: For his nasal drainage suggested using Flonase 2 sprays each nostril at night as well as possible use of antihistamine during the day such as Claritin Allegra or Zyrtec.  Also discussed with him concerning using saline irrigation during the day as this will help some with nasal drainage and gave him a sample of hypertonic saline nasal rinse. Concerning his balance problem I suspect this is related to his chemotherapy.  He has no clinical evidence of acute vestibular abnormality or BPPV.  Radene Journey, MD

## 2020-01-29 ENCOUNTER — Other Ambulatory Visit: Payer: Self-pay | Admitting: Family Medicine

## 2020-01-29 DIAGNOSIS — K5900 Constipation, unspecified: Secondary | ICD-10-CM

## 2020-02-04 ENCOUNTER — Telehealth: Payer: Self-pay | Admitting: Family Medicine

## 2020-02-04 ENCOUNTER — Encounter: Payer: Self-pay | Admitting: Family Medicine

## 2020-02-07 NOTE — Telephone Encounter (Signed)
Done

## 2020-02-08 ENCOUNTER — Encounter: Payer: Self-pay | Admitting: Family Medicine

## 2020-02-10 ENCOUNTER — Inpatient Hospital Stay: Payer: Medicare Other

## 2020-02-10 ENCOUNTER — Encounter: Payer: Self-pay | Admitting: Internal Medicine

## 2020-02-10 ENCOUNTER — Other Ambulatory Visit: Payer: Self-pay

## 2020-02-10 ENCOUNTER — Telehealth: Payer: Self-pay | Admitting: Family Medicine

## 2020-02-10 ENCOUNTER — Inpatient Hospital Stay: Payer: Medicare Other | Attending: Internal Medicine | Admitting: Internal Medicine

## 2020-02-10 VITALS — BP 125/57 | HR 95 | Temp 97.1°F | Resp 18 | Ht 75.0 in | Wt 222.8 lb

## 2020-02-10 DIAGNOSIS — C3490 Malignant neoplasm of unspecified part of unspecified bronchus or lung: Secondary | ICD-10-CM

## 2020-02-10 DIAGNOSIS — I252 Old myocardial infarction: Secondary | ICD-10-CM | POA: Insufficient documentation

## 2020-02-10 DIAGNOSIS — Z7984 Long term (current) use of oral hypoglycemic drugs: Secondary | ICD-10-CM | POA: Diagnosis not present

## 2020-02-10 DIAGNOSIS — D649 Anemia, unspecified: Secondary | ICD-10-CM | POA: Insufficient documentation

## 2020-02-10 DIAGNOSIS — R5382 Chronic fatigue, unspecified: Secondary | ICD-10-CM

## 2020-02-10 DIAGNOSIS — Z7982 Long term (current) use of aspirin: Secondary | ICD-10-CM | POA: Insufficient documentation

## 2020-02-10 DIAGNOSIS — Z5112 Encounter for antineoplastic immunotherapy: Secondary | ICD-10-CM

## 2020-02-10 DIAGNOSIS — Z79899 Other long term (current) drug therapy: Secondary | ICD-10-CM | POA: Diagnosis not present

## 2020-02-10 DIAGNOSIS — I10 Essential (primary) hypertension: Secondary | ICD-10-CM | POA: Insufficient documentation

## 2020-02-10 DIAGNOSIS — E785 Hyperlipidemia, unspecified: Secondary | ICD-10-CM | POA: Insufficient documentation

## 2020-02-10 DIAGNOSIS — C787 Secondary malignant neoplasm of liver and intrahepatic bile duct: Secondary | ICD-10-CM | POA: Diagnosis not present

## 2020-02-10 DIAGNOSIS — Z95828 Presence of other vascular implants and grafts: Secondary | ICD-10-CM

## 2020-02-10 DIAGNOSIS — I2510 Atherosclerotic heart disease of native coronary artery without angina pectoris: Secondary | ICD-10-CM

## 2020-02-10 DIAGNOSIS — Z5111 Encounter for antineoplastic chemotherapy: Secondary | ICD-10-CM

## 2020-02-10 DIAGNOSIS — R0989 Other specified symptoms and signs involving the circulatory and respiratory systems: Secondary | ICD-10-CM | POA: Diagnosis not present

## 2020-02-10 DIAGNOSIS — J439 Emphysema, unspecified: Secondary | ICD-10-CM | POA: Diagnosis not present

## 2020-02-10 DIAGNOSIS — C3491 Malignant neoplasm of unspecified part of right bronchus or lung: Secondary | ICD-10-CM | POA: Diagnosis not present

## 2020-02-10 DIAGNOSIS — R42 Dizziness and giddiness: Secondary | ICD-10-CM | POA: Insufficient documentation

## 2020-02-10 DIAGNOSIS — E1151 Type 2 diabetes mellitus with diabetic peripheral angiopathy without gangrene: Secondary | ICD-10-CM | POA: Insufficient documentation

## 2020-02-10 LAB — CBC WITH DIFFERENTIAL (CANCER CENTER ONLY)
Abs Immature Granulocytes: 0.02 10*3/uL (ref 0.00–0.07)
Basophils Absolute: 0.1 10*3/uL (ref 0.0–0.1)
Basophils Relative: 1 %
Eosinophils Absolute: 0.1 10*3/uL (ref 0.0–0.5)
Eosinophils Relative: 2 %
HCT: 28.8 % — ABNORMAL LOW (ref 39.0–52.0)
Hemoglobin: 9.1 g/dL — ABNORMAL LOW (ref 13.0–17.0)
Immature Granulocytes: 0 %
Lymphocytes Relative: 37 %
Lymphs Abs: 2.7 10*3/uL (ref 0.7–4.0)
MCH: 32.4 pg (ref 26.0–34.0)
MCHC: 31.6 g/dL (ref 30.0–36.0)
MCV: 102.5 fL — ABNORMAL HIGH (ref 80.0–100.0)
Monocytes Absolute: 1 10*3/uL (ref 0.1–1.0)
Monocytes Relative: 14 %
Neutro Abs: 3.4 10*3/uL (ref 1.7–7.7)
Neutrophils Relative %: 46 %
Platelet Count: 330 10*3/uL (ref 150–400)
RBC: 2.81 MIL/uL — ABNORMAL LOW (ref 4.22–5.81)
RDW: 17.1 % — ABNORMAL HIGH (ref 11.5–15.5)
WBC Count: 7.3 10*3/uL (ref 4.0–10.5)
nRBC: 0 % (ref 0.0–0.2)

## 2020-02-10 LAB — CMP (CANCER CENTER ONLY)
ALT: 21 U/L (ref 0–44)
AST: 28 U/L (ref 15–41)
Albumin: 3.6 g/dL (ref 3.5–5.0)
Alkaline Phosphatase: 74 U/L (ref 38–126)
Anion gap: 10 (ref 5–15)
BUN: 18 mg/dL (ref 8–23)
CO2: 24 mmol/L (ref 22–32)
Calcium: 9.4 mg/dL (ref 8.9–10.3)
Chloride: 105 mmol/L (ref 98–111)
Creatinine: 1.45 mg/dL — ABNORMAL HIGH (ref 0.61–1.24)
GFR, Est AFR Am: 58 mL/min — ABNORMAL LOW (ref >60)
GFR, Estimated: 50 mL/min — ABNORMAL LOW (ref >60)
Glucose, Bld: 178 mg/dL — ABNORMAL HIGH (ref 70–99)
Potassium: 4.2 mmol/L (ref 3.5–5.1)
Sodium: 139 mmol/L (ref 135–145)
Total Bilirubin: 0.3 mg/dL (ref 0.3–1.2)
Total Protein: 8.2 g/dL — ABNORMAL HIGH (ref 6.5–8.1)

## 2020-02-10 LAB — TSH: TSH: 0.74 u[IU]/mL (ref 0.320–4.118)

## 2020-02-10 MED ORDER — SODIUM CHLORIDE 0.9 % IV SOLN
Freq: Once | INTRAVENOUS | Status: AC
  Filled 2020-02-10: qty 250

## 2020-02-10 MED ORDER — PROCHLORPERAZINE MALEATE 10 MG PO TABS
10.0000 mg | ORAL_TABLET | Freq: Once | ORAL | Status: AC
  Administered 2020-02-10: 10 mg via ORAL

## 2020-02-10 MED ORDER — SODIUM CHLORIDE 0.9 % IV SOLN
500.0000 mg/m2 | Freq: Once | INTRAVENOUS | Status: AC
  Administered 2020-02-10: 1100 mg via INTRAVENOUS
  Filled 2020-02-10: qty 4

## 2020-02-10 MED ORDER — SODIUM CHLORIDE 0.9% FLUSH
10.0000 mL | INTRAVENOUS | Status: DC | PRN
  Administered 2020-02-10: 10 mL
  Filled 2020-02-10: qty 10

## 2020-02-10 MED ORDER — SODIUM CHLORIDE 0.9% FLUSH
10.0000 mL | Freq: Once | INTRAVENOUS | Status: AC
  Administered 2020-02-10: 10 mL
  Filled 2020-02-10: qty 10

## 2020-02-10 MED ORDER — HEPARIN SOD (PORK) LOCK FLUSH 100 UNIT/ML IV SOLN
500.0000 [IU] | Freq: Once | INTRAVENOUS | Status: AC | PRN
  Administered 2020-02-10: 500 [IU]
  Filled 2020-02-10: qty 5

## 2020-02-10 MED ORDER — PROCHLORPERAZINE MALEATE 10 MG PO TABS
ORAL_TABLET | ORAL | Status: AC
  Filled 2020-02-10: qty 1

## 2020-02-10 MED ORDER — CYANOCOBALAMIN 1000 MCG/ML IJ SOLN
INTRAMUSCULAR | Status: AC
  Filled 2020-02-10: qty 1

## 2020-02-10 MED ORDER — SODIUM CHLORIDE 0.9 % IV SOLN
200.0000 mg | Freq: Once | INTRAVENOUS | Status: AC
  Administered 2020-02-10: 200 mg via INTRAVENOUS
  Filled 2020-02-10: qty 8

## 2020-02-10 MED ORDER — CYANOCOBALAMIN 1000 MCG/ML IJ SOLN
1000.0000 ug | Freq: Once | INTRAMUSCULAR | Status: AC
  Administered 2020-02-10: 1000 ug via INTRAMUSCULAR

## 2020-02-10 NOTE — Telephone Encounter (Signed)
Patient has questions about his cancer treatments and would like to speak to you about them since you have answered some before. Requests a call back.

## 2020-02-10 NOTE — Progress Notes (Signed)
[name] [phone]) [phone]   [phone]  OFFICE PROGRESS NOTE  [name], FNP [name] [address]  DIAGNOSIS: Stage IV (TX, N3, M1c) non-small cell lung cancer, poorly differentiated adenocarcinoma presented with bulky mediastinal lymphadenopathy in addition to right axillary lymphadenopathy and metastatic liver lesions, abdominal lymphadenopathy diagnosed in January 2021. Molecular studies by [employer] that shows no actionable mutations.  PRIOR THERAPY: None  CURRENT THERAPY: Systemic chemotherapy with carboplatin for AUC of 5, Alimta 500 mg/M2 and Keytruda 200 mg IV every 3 weeks.  First dose July 14, 2019.  Status post 11 cycles. Starting from cycle #5 the patient will be treated with maintenance Alimta and Keytruda every 3 weeks.  INTERVAL HISTORY: Kirk [name] 64 y.o. male returns to the clinic today for follow-up visit.  The patient is feeling fine today with no concerning complaints except for occasional dizzy spells.  He was seen by ENT and was given prescription for meclizine and cleaning of his ears with no significant improvement.  He continues to have runny nose.  He denied having any chest pain, shortness of breath, cough or hemoptysis.  He denied having any fever or chills.  He has no nausea, vomiting, diarrhea or constipation.  He denied having any headache or visual changes.  He is here today for evaluation before starting cycle #11 of his treatment.  MEDICAL HISTORY: Past Medical History:  Diagnosis Date   Arthritis    "right knee" (09/04/2015)   Atrial flutter with rapid ventricular response ([facility]) 04/19/2019   Dizziness 10/2019   Dyspnea    increased exertion   HTN (hypertension)    Hyperlipidemia    Lung cancer ([facility]) 05/2019   NSTEMI (non-ST elevated myocardial infarction) ([facility]) 09/04/2015   a. cath 09/05/2015: 95% stenosis mid-LAD, 55% mid-RCA, and 40% prox-RCA. PCI performed w/  Synergy DES to LAD   PAD (peripheral artery disease) ([facility])    Stenting of bilateral iliacs in 2003.   Refusal of blood transfusions as patient is Jehovah's Witness    Type II diabetes mellitus ([facility])     ALLERGIES:  has No Known Allergies.  MEDICATIONS:  Current Outpatient Medications  Medication Sig Dispense Refill   acetaminophen (TYLENOL) 500 MG tablet Take 500 mg by mouth every 6 (six) hours as needed for moderate pain or fever.      aspirin EC 81 MG tablet Take 81 mg by mouth at bedtime.     carvedilol (COREG) 12.5 MG tablet Take 1 tablet (12.5 mg total) by mouth 2 (two) times daily with a meal. (Patient taking differently: Take 12.5 mg by mouth at bedtime. ) 180 tablet 3   cetirizine (ZYRTEC) 10 MG tablet TAKE 1 TABLET BY MOUTH DAILY (Patient taking differently: Take 10 mg by mouth daily as needed for allergies. ) 30 tablet 0   clopidogrel (PLAVIX) 75 MG tablet Take 1 tablet (75 mg total) by mouth daily. 90 tablet 3   docusate sodium (COLACE) 100 MG capsule Take 1 capsule (100 mg total) by mouth 2 (two) times daily as needed for mild constipation. 60 capsule 11   ferrous sulfate 324 MG TBEC Take 1 tablet (324 mg total) by mouth daily with breakfast. 30 tablet 11   folic acid (FOLVITE) 1 MG tablet TAKE 1 TABLET BY MOUTH EVERY DAY 90 tablet 1   glimepiride (AMARYL) 4 MG tablet Take 1 tablet (4 mg total) by mouth daily. 90 tablet 3   Hydrocortisone  Acetate 1 % CREA Apply 15 g topically 2 (two) times daily. 15 g 3   ipratropium (ATROVENT) 0.03 % nasal spray USE 2 SPRAYS IN EACH NOSTRIL TWICE DAILY (Patient taking differently: Place 1 spray into both nostrils daily as needed (allergies). ) 30 mL 2   lactulose (CHRONULAC) 10 GM/15ML solution Take 30 mLs (20 g total) by mouth 2 (two) times daily as needed for moderate constipation. 240 mL 11   lidocaine-prilocaine (EMLA) cream APPLY TO THE PORT-A-CATH SITE 30-60 MINUTES BEFORE TREATMENT. (Patient taking differently: Apply 1  application topically daily as needed (access port). Apply to the Port-A-Cath site 30-60 minutes before treatment.) 30 g 0   lisinopril (ZESTRIL) 10 MG tablet Take 1 tablet (10 mg total) by mouth daily. 90 tablet 3   meclizine (ANTIVERT) 25 MG tablet Take 1 tablet (25 mg total) by mouth 3 (three) times daily as needed for dizziness. (Patient taking differently: Take 25 mg by mouth 3 (three) times daily. ) 90 tablet 3   metFORMIN (GLUCOPHAGE) 1000 MG tablet TAKE 1 TABLET (1,000 MG TOTAL) BY MOUTH 2 (TWO) TIMES DAILY WITH A MEAL. 180 tablet 1   multivitamin (ONE-A-DAY MEN'S) TABS tablet Take 1 tablet by mouth daily. 30 tablet 6   nitroGLYCERIN (NITROSTAT) 0.4 MG SL tablet Place 1 tablet (0.4 mg total) under the tongue every 5 (five) minutes x 3 doses as needed for chest pain. 25 tablet 2   oxyCODONE-acetaminophen (PERCOCET/ROXICET) 5-325 MG tablet Take 1 tablet by mouth every 6 (six) hours as needed for severe pain. 30 tablet 0   pantoprazole (PROTONIX) 40 MG tablet TAKE 1 TABLET BY MOUTH EVERY DAY 30 tablet 5   prochlorperazine (COMPAZINE) 10 MG tablet Take 1 tablet (10 mg total) by mouth every 6 (six) hours as needed for nausea or vomiting. 30 tablet 1   rosuvastatin (CRESTOR) 40 MG tablet TAKE 1 TABLET BY MOUTH DAILY 90 tablet 3   sucralfate (CARAFATE) 1 GM/10ML suspension Take 10 mLs (1 g total) by mouth 4 (four) times daily. 420 mL 1   No current facility-administered medications for this visit.    SURGICAL HISTORY:  Past Surgical History:  Procedure Laterality Date   CARDIAC CATHETERIZATION N/A 09/05/2015   Procedure: Left Heart Cath and Coronary Angiography;  Surgeon: Troy Sine, MD;  Location: [facility];  Service: Cardiovascular;  Laterality: N/A;   CARDIAC CATHETERIZATION N/A 09/05/2015   Procedure: Coronary Stent Intervention;  Surgeon: Troy Sine, MD;  Location: [facility];  Service: Cardiovascular;  Laterality: N/A;   IR IMAGING GUIDED PORT  INSERTION  07/15/2019   ORIF CONGENITAL HIP DISLOCATION Left ~ 1965   "put pins in it"   PERIPHERAL VASCULAR CATHETERIZATION Bilateral 2003   "stenting"   TONSILLECTOMY  ~ [place]:  A comprehensive review of systems was negative except for: Constitutional: positive for fatigue Neurological: positive for dizziness   PHYSICAL EXAMINATION: General appearance: alert, cooperative, appears stated age, fatigued and no distress Head: Normocephalic, without obvious abnormality, atraumatic Neck: no adenopathy, no JVD, supple, symmetrical, trachea midline and thyroid not enlarged, symmetric, no tenderness/mass/nodules Lymph nodes: Cervical, supraclavicular, and axillary nodes normal. Resp: clear to auscultation bilaterally Back: symmetric, no curvature. ROM normal. No CVA tenderness. Cardio: regular rate and rhythm, S1, S2 normal, no murmur, click, rub or gallop GI: soft, non-tender; bowel sounds normal; no masses,  no organomegaly Extremities: extremities normal, atraumatic, no cyanosis or edema  ECOG PERFORMANCE STATUS: 1 -  Symptomatic but completely ambulatory  Blood pressure (!) 125/57, pulse 95, temperature (!) 97.1 F (36.2 C), temperature source Tympanic, resp. rate 18, height 6\' 3"  (1.905 m), weight 222 lb 12.8 oz (101.1 kg), SpO2 100 %.  LABORATORY DATA: Lab Results  Component Value Date   WBC 7.0 01/18/2020   HGB 9.0 (L) 01/18/2020   HCT 28.5 (L) 01/18/2020   MCV 101.1 (H) 01/18/2020   PLT 363 01/18/2020      Chemistry      Component Value Date/Time   NA 138 01/18/2020 1204   NA 140 06/08/2015 0840   K 4.4 01/18/2020 1204   CL 104 01/18/2020 1204   CO2 23 01/18/2020 1204   BUN 21 01/18/2020 1204   BUN 15 06/08/2015 0840   CREATININE 1.50 (H) 01/18/2020 1204   CREATININE 1.06 10/01/2016 0855      Component Value Date/Time   CALCIUM 9.9 01/18/2020 1204   ALKPHOS 66 01/18/2020 1204   AST 35 01/18/2020 1204   ALT 33 01/18/2020 1204   BILITOT <0.2  (L) 01/18/2020 1204       RADIOGRAPHIC STUDIES: CT Chest W Contrast  Result Date: 01/17/2020 CLINICAL DATA:  Non-small cell lung cancer with ongoing chemotherapy. Restaging. EXAM: CT CHEST, ABDOMEN, AND PELVIS WITH CONTRAST TECHNIQUE: Multidetector CT imaging of the chest, abdomen and pelvis was performed following the standard protocol during bolus administration of intravenous contrast. CONTRAST:  182mL OMNIPAQUE IOHEXOL 300 MG/ML  SOLN COMPARISON:  11/15/2019 CT chest, abdomen and pelvis. FINDINGS: CT CHEST FINDINGS Cardiovascular: Normal heart size. Stable trace pericardial effusion/thickening. Three-vessel coronary atherosclerosis. Right internal jugular Port-A-Cath terminates at the cavoatrial junction. Atherosclerotic nonaneurysmal thoracic aorta. Top-normal caliber main pulmonary artery (3.1 cm diameter). No central pulmonary emboli. Mediastinum/Nodes: No discrete thyroid nodules. Unremarkable esophagus. No pathologically enlarged axillary lymph nodes. Enlarged 1.5 cm subcarinal node (series 2/image 33), decreased from 1.7 cm on 11/15/2019 CT using similar measurement technique. No additional pathologically enlarged mediastinal nodes. No pathologically enlarged hilar nodes. Lungs/Pleura: No pneumothorax. No pleural effusion. Moderate centrilobular emphysema with diffuse bronchial wall thickening. No acute consolidative airspace disease, lung masses or significant pulmonary nodules. Musculoskeletal: No aggressive appearing focal osseous lesions. Mild thoracic spondylosis. CT ABDOMEN PELVIS FINDINGS Hepatobiliary: Normal liver size. Stable 1.9 cm segment 5 right liver hemangioma. Hypodense 1.1 cm segment 4A left liver lesion (series 2/image 54), previously 1.1 cm, stable. Hypodense 0.6 cm segment 8 right liver dome lesion (series 2/image 53), previously 0.7 cm, slightly decreased. No new liver lesions. Normal gallbladder with no radiopaque cholelithiasis. No biliary ductal dilatation. Pancreas: Normal,  with no mass or duct dilation. Spleen: Normal size. No mass. Adrenals/Urinary Tract: Normal adrenals. No hydronephrosis. No renal masses. Stable heterogeneous parenchymal enhancement throughout both kidneys without significant perinephric fat stranding. Normal bladder. Stomach/Bowel: Normal non-distended stomach. Normal caliber small bowel with no small bowel wall thickening. Normal appendix. Oral contrast transits to the right colon. Moderate diffuse colonic stool volume. No large bowel wall thickening, diverticulosis or acute pericolonic fat stranding. Vascular/Lymphatic: Atherosclerotic nonaneurysmal abdominal aorta. Patent portal, splenic, hepatic and renal veins. Mildly enlarged 1.4 cm gastrohepatic ligament node (series 2/image 60), previously 1.6 cm, slightly decreased. Enlarged 1.4 cm porta hepatis node (series 2/image 61), previously 1.4 cm, stable. No new pathologically enlarged lymph nodes in the abdomen or pelvis. Reproductive: Top-normal size prostate. Other: No pneumoperitoneum, ascites or focal fluid collection. Musculoskeletal: No aggressive appearing focal osseous lesions. Fixation pins in the left femoral neck. Mild lumbar spondylosis. IMPRESSION: 1. No new or progressive  metastatic disease. Continued positive response to therapy. Subcarinal, gastrohepatic ligament and porta hepatis lymphadenopathy is stable to slightly decreased. Liver metastases are stable to slightly decreased. 2. Stable heterogeneous parenchymal enhancement throughout both kidneys, nonspecific, suggest correlation with serum creatinine. No discrete renal mass. No hydronephrosis. 3. Moderate diffuse colonic stool volume, suggesting constipation. 4. Three-vessel coronary atherosclerosis. 5. Aortic Atherosclerosis (ICD10-I70.0) and Emphysema (ICD10-J43.9). Electronically Signed   By: Ilona Sorrel M.D.   On: 01/17/2020 09:59   CT Abdomen Pelvis W Contrast  Result Date: 01/17/2020 CLINICAL DATA:  Non-small cell lung cancer with  ongoing chemotherapy. Restaging. EXAM: CT CHEST, ABDOMEN, AND PELVIS WITH CONTRAST TECHNIQUE: Multidetector CT imaging of the chest, abdomen and pelvis was performed following the standard protocol during bolus administration of intravenous contrast. CONTRAST:  143mL OMNIPAQUE IOHEXOL 300 MG/ML  SOLN COMPARISON:  11/15/2019 CT chest, abdomen and pelvis. FINDINGS: CT CHEST FINDINGS Cardiovascular: Normal heart size. Stable trace pericardial effusion/thickening. Three-vessel coronary atherosclerosis. Right internal jugular Port-A-Cath terminates at the cavoatrial junction. Atherosclerotic nonaneurysmal thoracic aorta. Top-normal caliber main pulmonary artery (3.1 cm diameter). No central pulmonary emboli. Mediastinum/Nodes: No discrete thyroid nodules. Unremarkable esophagus. No pathologically enlarged axillary lymph nodes. Enlarged 1.5 cm subcarinal node (series 2/image 33), decreased from 1.7 cm on 11/15/2019 CT using similar measurement technique. No additional pathologically enlarged mediastinal nodes. No pathologically enlarged hilar nodes. Lungs/Pleura: No pneumothorax. No pleural effusion. Moderate centrilobular emphysema with diffuse bronchial wall thickening. No acute consolidative airspace disease, lung masses or significant pulmonary nodules. Musculoskeletal: No aggressive appearing focal osseous lesions. Mild thoracic spondylosis. CT ABDOMEN PELVIS FINDINGS Hepatobiliary: Normal liver size. Stable 1.9 cm segment 5 right liver hemangioma. Hypodense 1.1 cm segment 4A left liver lesion (series 2/image 54), previously 1.1 cm, stable. Hypodense 0.6 cm segment 8 right liver dome lesion (series 2/image 53), previously 0.7 cm, slightly decreased. No new liver lesions. Normal gallbladder with no radiopaque cholelithiasis. No biliary ductal dilatation. Pancreas: Normal, with no mass or duct dilation. Spleen: Normal size. No mass. Adrenals/Urinary Tract: Normal adrenals. No hydronephrosis. No renal masses. Stable  heterogeneous parenchymal enhancement throughout both kidneys without significant perinephric fat stranding. Normal bladder. Stomach/Bowel: Normal non-distended stomach. Normal caliber small bowel with no small bowel wall thickening. Normal appendix. Oral contrast transits to the right colon. Moderate diffuse colonic stool volume. No large bowel wall thickening, diverticulosis or acute pericolonic fat stranding. Vascular/Lymphatic: Atherosclerotic nonaneurysmal abdominal aorta. Patent portal, splenic, hepatic and renal veins. Mildly enlarged 1.4 cm gastrohepatic ligament node (series 2/image 60), previously 1.6 cm, slightly decreased. Enlarged 1.4 cm porta hepatis node (series 2/image 61), previously 1.4 cm, stable. No new pathologically enlarged lymph nodes in the abdomen or pelvis. Reproductive: Top-normal size prostate. Other: No pneumoperitoneum, ascites or focal fluid collection. Musculoskeletal: No aggressive appearing focal osseous lesions. Fixation pins in the left femoral neck. Mild lumbar spondylosis. IMPRESSION: 1. No new or progressive metastatic disease. Continued positive response to therapy. Subcarinal, gastrohepatic ligament and porta hepatis lymphadenopathy is stable to slightly decreased. Liver metastases are stable to slightly decreased. 2. Stable heterogeneous parenchymal enhancement throughout both kidneys, nonspecific, suggest correlation with serum creatinine. No discrete renal mass. No hydronephrosis. 3. Moderate diffuse colonic stool volume, suggesting constipation. 4. Three-vessel coronary atherosclerosis. 5. Aortic Atherosclerosis (ICD10-I70.0) and Emphysema (ICD10-J43.9). Electronically Signed   By: Ilona Sorrel M.D.   On: 01/17/2020 09:59    ASSESSMENT AND PLAN: This is a very pleasant 64 years old African-American male recently diagnosed with a stage IV (TX, N3, M1c) non-small cell lung cancer, poorly  differentiated adenocarcinoma presented with bulky mediastinal as well as right  hilar, supraclavicular, abdominal and right axillary lymphadenopathy in addition to metastatic liver lesions diagnosed in January 2021. Molecular studies by [employer] shows no actionable mutations.  The patient is not a candidate for treatment with targeted therapy or enrollment in the clinical trial with the [facility] regimen. The patient is currently undergoing systemic chemotherapy with carboplatin for AUC of 5, Alimta 500 mg/M2 and Keytruda 200 mg IV every 3 weeks status post 10 cycles. Starting from cycle #5 he is treated with maintenance Alimta and Keytruda every 3 weeks. The patient is tolerating his treatment well except for the fatigue and occasional dizzy spells which may not be related to his treatment except for the persistent anemia. He is currently on oral iron tablets.  He is Jehovah's Witness and refused transfusion in the past. I recommended for the patient to proceed with cycle #11 today as planned.  He will discuss with his family the option of discontinuing treatment and switching to observation.  He will let us know in the next few weeks. I will arrange for the patient to come back for follow-up visit in 3 weeks for evaluation before the next cycle of his treatment unless he decides to discontinue his treatment. The patient was advised to call immediately if he has any concerning symptoms in the interval. The patient voices understanding of current disease status and treatment options and is in agreement with the current care plan.  All questions were answered. The patient knows to call the clinic with any problems, questions or concerns. We can certainly see the patient much sooner if necessary.  Disclaimer: This note was dictated with voice recognition software. Similar sounding words can inadvertently be transcribed and may not be corrected upon review.

## 2020-02-11 ENCOUNTER — Telehealth (INDEPENDENT_AMBULATORY_CARE_PROVIDER_SITE_OTHER): Payer: Medicare Other | Admitting: Family Medicine

## 2020-02-11 DIAGNOSIS — R42 Dizziness and giddiness: Secondary | ICD-10-CM | POA: Diagnosis not present

## 2020-02-11 DIAGNOSIS — J3489 Other specified disorders of nose and nasal sinuses: Secondary | ICD-10-CM

## 2020-02-11 DIAGNOSIS — Z09 Encounter for follow-up examination after completed treatment for conditions other than malignant neoplasm: Secondary | ICD-10-CM

## 2020-02-11 DIAGNOSIS — R110 Nausea: Secondary | ICD-10-CM

## 2020-02-11 DIAGNOSIS — I10 Essential (primary) hypertension: Secondary | ICD-10-CM

## 2020-02-11 DIAGNOSIS — T889XXA Complication of surgical and medical care, unspecified, initial encounter: Secondary | ICD-10-CM

## 2020-02-11 DIAGNOSIS — I517 Cardiomegaly: Secondary | ICD-10-CM

## 2020-02-11 DIAGNOSIS — I2510 Atherosclerotic heart disease of native coronary artery without angina pectoris: Secondary | ICD-10-CM

## 2020-02-11 DIAGNOSIS — E119 Type 2 diabetes mellitus without complications: Secondary | ICD-10-CM

## 2020-02-11 DIAGNOSIS — E1151 Type 2 diabetes mellitus with diabetic peripheral angiopathy without gangrene: Secondary | ICD-10-CM | POA: Diagnosis not present

## 2020-02-11 DIAGNOSIS — C3490 Malignant neoplasm of unspecified part of unspecified bronchus or lung: Secondary | ICD-10-CM

## 2020-02-11 DIAGNOSIS — J302 Other seasonal allergic rhinitis: Secondary | ICD-10-CM

## 2020-02-11 DIAGNOSIS — C787 Secondary malignant neoplasm of liver and intrahepatic bile duct: Secondary | ICD-10-CM

## 2020-02-11 NOTE — Progress Notes (Signed)
Virtual Visit via Telephone Note  I connected with Kirk [name] on 02/16/20 at 11:00 AM EDT by telephone and verified that I am speaking with the correct person using two identifiers.   I discussed the limitations, risks, security and privacy concerns of performing an evaluation and management service by telephone and the availability of in person appointments. I also discussed with the patient that there may be a patient responsible charge related to this service. The patient expressed understanding and agreed to proceed.   Televisit Today Patient Location: Home Provider Location: Office   History of Present Illness:  Social History   Socioeconomic History  . Marital status: Married    Spouse name: Not on file  . Number of children: 1  . Years of education: Not on file  . Highest education level: Not on file  Occupational History  . Occupation: Acupuncturist  Tobacco Use  . Smoking status: Former Smoker    Packs/day: 2.00    Years: 35.00    Pack years: 70.00    Types: Cigarettes    Quit date: 02/19/2003    Years since quitting: 17.0  . Smokeless tobacco: Never Used  Vaping Use  . Vaping Use: Never used  Substance and Sexual Activity  . Alcohol use: Not Currently    Alcohol/week: 0.0 standard drinks    Comment: 09/04/2015 "nothing since 1980s"  . 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 Sexual activity: Not Currently  Other Topics Concern  . Not on file  Social History Narrative  . Not on file   Social Determinants of Health   Financial Resource Strain:   . Difficulty of Paying Living Expenses: Not on file  Food Insecurity:   . Worried About Charity fundraiser in the Last Year: Not on file  . Ran Out of Food in the Last Year: Not on file  Transportation Needs:   . Lack of Transportation (Medical): Not on file  . Lack of Transportation (Non-Medical): Not on file  Physical Activity:   . Days of Exercise per Week: Not  on file  . Minutes of Exercise per Session: Not on file  Stress:   . Feeling of Stress : Not on file  Social Connections:   . Frequency of Communication with Friends and Family: Not on file  . Frequency of Social Gatherings with Friends and Family: Not on file  . Attends Religious Services: Not on file  . Active Member of Clubs or Organizations: Not on file  . Attends Archivist Meetings: Not on file  . Marital Status: Not on file  Intimate Partner Violence:   . Fear of Current or Ex-Partner: Not on file  . Emotionally Abused: Not on file  . Physically Abused: Not on file  . Sexually Abused: Not on file    Past Surgical History:  Procedure Laterality Date  . CARDIAC CATHETERIZATION N/A 09/05/2015   Procedure: Left Heart Cath and Coronary Angiography;  Surgeon: Troy Sine, MD;  Location: [facility];  Service: Cardiovascular;  Laterality: N/A;  . CARDIAC CATHETERIZATION N/A 09/05/2015   Procedure: Coronary Stent Intervention;  Surgeon: Troy Sine, MD;  Location: [facility];  Service: Cardiovascular;  Laterality: N/A;  . IR IMAGING GUIDED PORT INSERTION  07/15/2019  . 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 PERIPHERAL VASCULAR CATHETERIZATION Bilateral 2003   "stenting"  .  TONSILLECTOMY  ~ 1963    Past Medical History:  Diagnosis Date  . Arthritis    "right knee" (09/04/2015)  . Atrial flutter with rapid ventricular response ([place]) 04/19/2019  . Dizziness 10/2019  . Dyspnea    increased exertion  . HTN (hypertension)   . Hyperlipidemia   . Lung cancer ([place]) 05/2019  . NSTEMI (non-ST elevated myocardial infarction) ([place]) 09/04/2015   a. cath 09/05/2015: 95% stenosis mid-LAD, 55% mid-RCA, and 40% prox-RCA. PCI performed w/ Synergy DES to LAD  . PAD (peripheral artery disease) (H[facility])    Stenting of bilateral iliacs in 2003.  [name] Refusal of blood transfusions as patient is Jehovah's Witness   . Type II diabetes mellitus  (H[facility])     Family History  Problem Relation Age of Onset  . Lymphoma Mother   . Cancer - Prostate Father   . CAD Neg Hx   . Colon cancer Neg Hx   . Rectal cancer Neg Hx   . Stomach cancer Neg Hx   . Esophageal cancer Neg Hx    Patient Active Problem List   Diagnosis Date Noted  . Port-A-Cath in place 12/29/2019  . Metastatic non-small cell lung cancer ([name]) 07/07/2019  . Encounter for antineoplastic chemotherapy 07/07/2019  . Encounter for antineoplastic immunotherapy 07/07/2019  . Goals of care, counseling/discussion 06/28/2019  . Adenocarcinoma of right lung, stage 4 ([place]) 06/28/2019  . Cancer associated pain 06/28/2019  . CAD (coronary artery disease) 06/08/2019  . Mediastinal lymphadenopathy 06/03/2019  . Liver metastasis ([place]) 06/03/2019  . Hemoglobin A1C between 7% and 9% indicating borderline diabetic control 05/10/2019  . COVID-19 virus infection 05/10/2019  . Atrial flutter with rapid ventricular response ([place]) 04/19/2019  . Chest pain with moderate risk for cardiac etiology 04/19/2019  . Paroxysmal atrial flutter ([place]) 04/19/2019  . Chest congestion 07/10/2018  . Cough 07/10/2018  . NSTEMI (non-ST elevated myocardial infarction) ([place]) 09/04/2015  . PVD (peripheral vascular disease) ([place]) 06/08/2015  . DDD (degenerative disc disease), cervical 06/08/2015  . Hyperlipidemia 06/08/2015  . Diabetes mellitus with nephropathy ([place]) 06/08/2015  . Diabetes mellitus with peripheral vascular disease ([place]) 06/08/2015  . Annual physical exam 01/03/2015  . PAD (peripheral artery disease) ([place]) 02/18/2013  . Diabetes ([place]) 02/18/2013  . HTN (hypertension) 02/18/2013   Current Status: Since his last office visit, he is doing well with no complaints. He is currently receiving chemotherapy treatments for Metastatic Non-Small Cell Carcinoma, per Dr. [name]. His anxiety continues is increased r/t adverse side effects of nausea, rhinorrhea, and extreme dizziness. He was referred to  ENT which he followed up on 01/26/2020. He continues to take Meclizine and Prochlorperazine as needed for symptoms. He has not had a recent fall, but reports that he came close because of dizziness. He denies fevers, chills, fatigue, recent infections, weight loss, and night sweats. He has not had any visual changes. No chest pain, heart palpitations, cough and shortness of breath reported. Denies GI problems such as vomiting, diarrhea, and constipation. He has no reports of blood in stools, dysuria and hematuria.  He is taking all medications as prescribed. He reports mild pain today.   Observations/Objective:  Telephone Visit  Assessment and Plan:  1. Essential hypertension He will continue to take medications as prescribed, to decrease high sodium intake, excessive alcohol intake, increase potassium intake, smoking cessation, and increase physical activity of at least 30 minutes of cardio activity daily. He will continue to follow Heart Healthy or DASH diet.  2. Metastatic non-small cell  lung cancer ([place]) Continue follow ups with Oncology.   3. Diabetes mellitus with peripheral vascular disease ([place]) He will continue medication as prescribed, to decrease foods/beverages high in sugars and carbs and follow Heart Healthy or DASH diet. Increase physical activity to at least 30 minutes cardio exercise daily.   4. Dizziness Frequent. Monitor. We will order Rollator today.   5. Adverse effect of treatment, initial encounter  6. Rhinorrhea Stable today. Recent follow up with Dr. Lucia Gaskins   7. Nausea Stable today.   8. Follow up He will keep follow up appointment.   Meds ordered this encounter  Medications  . carvedilol (COREG) 12.5 MG tablet    Sig: Take 1 tablet (12.5 mg total) by mouth 2 (two) times daily with a meal.    Dispense:  180 tablet    Refill:  3  . cetirizine (ZYRTEC) 10 MG tablet    Sig: Take 1 tablet (10 mg total) by mouth daily.    Dispense:  90 tablet    Refill:  3   . clopidogrel (PLAVIX) 75 MG tablet    Sig: Take 1 tablet (75 mg total) by mouth daily.    Dispense:  90 tablet    Refill:  3  . folic acid (FOLVITE) 1 MG tablet    Sig: Take 1 tablet (1 mg total) by mouth daily.    Dispense:  90 tablet    Refill:  3  . metFORMIN (GLUCOPHAGE) 1000 MG tablet    Sig: Take 1 tablet (1,000 mg total) by mouth 2 (two) times daily with a meal.    Dispense:  180 tablet    Refill:  3  . pantoprazole (PROTONIX) 40 MG tablet    Sig: Take 1 tablet (40 mg total) by mouth daily.    Dispense:  90 tablet    Refill:  3  . prochlorperazine (COMPAZINE) 10 MG tablet    Sig: Take 1 tablet (10 mg total) by mouth every 6 (six) hours as needed for nausea or vomiting.    Dispense:  30 tablet    Refill:  11  . rosuvastatin (CRESTOR) 40 MG tablet    Sig: Take 1 tablet (40 mg total) by mouth daily.    Dispense:  90 tablet    Refill:  3    No orders of the defined types were placed in this encounter.   No orders of the defined types were placed in this encounter.  Referral Orders  No referral(s) requested today    Kathe Becton,  MSN, FNP-BC [facility] [address] [place], [place] [address] [phone]- fax   I discussed the assessment and treatment plan with the patient. The patient was provided an opportunity to ask questions and all were answered. The patient agreed with the plan and demonstrated an understanding of the instructions.   The patient was advised to call back or seek an in-person evaluation if the symptoms worsen or if the condition fails to improve as anticipated.  I provided 20 minutes of non-face-to-face time during this encounter.   [name], FNP

## 2020-02-16 ENCOUNTER — Encounter: Payer: Self-pay | Admitting: Family Medicine

## 2020-02-16 MED ORDER — PANTOPRAZOLE SODIUM 40 MG PO TBEC
40.0000 mg | DELAYED_RELEASE_TABLET | Freq: Every day | ORAL | 3 refills | Status: DC
Start: 2020-02-16 — End: 2021-02-28

## 2020-02-16 MED ORDER — CARVEDILOL 12.5 MG PO TABS: 12.5000 mg | ORAL_TABLET | Freq: Two times a day (BID) | ORAL | 3 refills | Status: DC

## 2020-02-16 MED ORDER — PROCHLORPERAZINE MALEATE 10 MG PO TABS: 10.0000 mg | ORAL_TABLET | Freq: Four times a day (QID) | ORAL | 11 refills | Status: DC | PRN

## 2020-02-16 MED ORDER — ROSUVASTATIN CALCIUM 40 MG PO TABS
40.0000 mg | ORAL_TABLET | Freq: Every day | ORAL | 3 refills | Status: DC
Start: 2020-02-16 — End: 2020-12-25

## 2020-02-16 MED ORDER — CLOPIDOGREL BISULFATE 75 MG PO TABS
75.0000 mg | ORAL_TABLET | Freq: Every day | ORAL | 3 refills | Status: DC
Start: 2020-02-16 — End: 2020-12-25

## 2020-02-16 MED ORDER — CETIRIZINE HCL 10 MG PO TABS: 10.0000 mg | ORAL_TABLET | Freq: Every day | ORAL | 3 refills | Status: DC

## 2020-02-16 MED ORDER — METFORMIN HCL 1000 MG PO TABS: 1000.0000 mg | ORAL_TABLET | Freq: Two times a day (BID) | ORAL | 3 refills | Status: DC

## 2020-02-16 MED ORDER — FOLIC ACID 1 MG PO TABS
1.0000 mg | ORAL_TABLET | Freq: Every day | ORAL | 3 refills | Status: DC
Start: 2020-02-16 — End: 2021-03-15

## 2020-02-17 ENCOUNTER — Other Ambulatory Visit: Payer: Self-pay

## 2020-02-17 DIAGNOSIS — K5900 Constipation, unspecified: Secondary | ICD-10-CM

## 2020-02-17 MED ORDER — LACTULOSE 10 GM/15ML PO SOLN: 20.0000 g | Freq: Two times a day (BID) | ORAL | 11 refills | Status: DC | PRN

## 2020-02-17 NOTE — Telephone Encounter (Signed)
-----   Message from [name], [place] sent at 02/16/2020  8:53 PM EDT ----- Regarding: "Follow Up" Kirk [name] is currently receiving chemotherapy treatments at [facility] per Dr. [name]. Please inform Mr. Molinari that I have reached out to Dr. [name] about his concerns of chemotherapy treatment options. Someone should be giving him a call or will be discussing options at his next follow up appointment with Dr. [name]. Thank you Asaad Gulley!

## 2020-02-25 NOTE — Progress Notes (Signed)
Kirk [name] OFFICE PROGRESS NOTE  Kirk Glatter, FNP [name] [address]  DIAGNOSIS:  Stage IV (TX, N3, M1c) non-small cell lung cancer, poorly differentiated adenocarcinoma presented with bulky mediastinal lymphadenopathy in addition to right axillary lymphadenopathy and metastatic liver lesions, abdominal lymphadenopathy diagnosed in January 2021. Molecular studies by [facility] [employer] that shows no actionable mutations.  PRIOR THERAPY: None  CURRENT THERAPY: Systemic chemotherapy with carboplatin for AUC of 5, Alimta 500 mg/M2 and Keytruda 200 mg IV every 3 weeks.  First dose July 14, 2019.  Status post 11 cycles. Starting from cycle #5 the patient will be treated with maintenance Alimta and Keytruda every 3 weeks.  INTERVAL HISTORY: Kirk [name] 64 y.o. male returns to the clinic today for a follow-up visit. The patient is feeling fair today without any concerning complaints except for fatigue, persistent dizziness, and a small knot on the right parietal region side of his scalp. Regarding his dizziness, he was seen by ENT in the past and given meclizine without significant improvement. The dizziness is exacerbated by moving his head too quickly to the side as well as rolling over in bed. 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 He tends to be anemic and is on iron supplements. His last hemoglobin was 9.1 and he is fairly stable at 8.8 today. Regarding the knot on his scalp, the patient states this has been present for a few months but has enlarged over the course of the last few weeks. On his most recent imaging study of his brain from July 2021, it mentioned subcentimeter nonspecific T2/FLAIR hyperintense lesion within the right parietal scalp with associated restricted diffusion, nonspecific but possibly reflecting an epidermoid cyst. He describes that this area feels sore. Denies warmth, itching, or drainage.    His last  appointment, Dr. [name] recommended continue on the same treatment for considering comfort care. After discussion with his family the patient is interested in continuing with treatment because he is concerned that if he has disease progression, he would need to restart more challenging chemotherapy.   Otherwise he denies any recent fever, chills, night sweats, or weight loss. Denies any chest pain, cough, or hemoptysis. He reports some shortness of breath with exertion and needing to rest but he also attributes this to fatigue. Denies any nausea, vomiting, or diarrhea. He has some constipation which he attributes to his iron supplements. He takes a stool softener twice a day.  Denies any headache or visual changes.  The patient is here today for evaluation before starting cycle #12.    MEDICAL HISTORY: Past Medical History:  Diagnosis Date   Arthritis    "right knee" (09/04/2015)   Atrial flutter with rapid ventricular response ([facility]) 04/19/2019   Dizziness 10/2019   Dyspnea    increased exertion   HTN (hypertension)    Hyperlipidemia    Lung cancer ([facility]) 05/2019   NSTEMI (non-ST elevated myocardial infarction) ([facility]) 09/04/2015   a. cath 09/05/2015: 95% stenosis mid-LAD, 55% mid-RCA, and 40% prox-RCA. PCI performed w/ Synergy DES to LAD   PAD (peripheral artery disease) ([facility])    Stenting of bilateral iliacs in 2003.   Refusal of blood transfusions as patient is Jehovah's Witness    Type II diabetes mellitus ([facility])     ALLERGIES:  has No Known Allergies.  MEDICATIONS:  Current Outpatient Medications  Medication Sig Dispense Refill   acetaminophen (TYLENOL) 500 MG tablet Take 500 mg by mouth every 6 (  six) hours as needed for moderate pain or fever.      aspirin EC 81 MG tablet Take 81 mg by mouth at bedtime.     carvedilol (COREG) 12.5 MG tablet Take 1 tablet (12.5 mg total) by mouth 2 (two) times daily with a meal. 180 tablet 3   cetirizine (ZYRTEC) 10 MG tablet Take 1  tablet (10 mg total) by mouth daily. 90 tablet 3   clopidogrel (PLAVIX) 75 MG tablet Take 1 tablet (75 mg total) by mouth daily. 90 tablet 3   docusate sodium (COLACE) 100 MG capsule Take 1 capsule (100 mg total) by mouth 2 (two) times daily as needed for mild constipation. 60 capsule 11   ferrous sulfate 324 MG TBEC Take 1 tablet (324 mg total) by mouth daily with breakfast. 30 tablet 11   folic acid (FOLVITE) 1 MG tablet Take 1 tablet (1 mg total) by mouth daily. 90 tablet 3   furosemide (LASIX) 20 MG tablet Take 1 tablet (20 mg total) by mouth 2 (two) times daily. 60 tablet 1   gabapentin (NEURONTIN) 300 MG capsule Take 1 capsule (300 mg total) by mouth at bedtime. 30 capsule 1   glimepiride (AMARYL) 4 MG tablet Take 1 tablet (4 mg total) by mouth daily. 90 tablet 3   Hydrocortisone Acetate 1 % CREA Apply 15 g topically 2 (two) times daily. 15 g 3   ipratropium (ATROVENT) 0.03 % nasal spray USE 2 SPRAYS IN EACH NOSTRIL TWICE DAILY (Patient taking differently: Place 1 spray into both nostrils daily as needed (allergies). ) 30 mL 2   lactulose (CHRONULAC) 10 GM/15ML solution TAKE 30 MLS (20 G TOTAL) BY MOUTH 2 (TWO) TIMES DAILY AS NEEDED FOR MODERATE CONSTIPATION. 720 mL 1   lactulose (CHRONULAC) 10 GM/15ML solution Take 30 mLs (20 g total) by mouth 2 (two) times daily as needed for moderate constipation. 240 mL 11   lidocaine-prilocaine (EMLA) cream APPLY TO THE PORT-A-CATH SITE 30-60 MINUTES BEFORE TREATMENT. (Patient taking differently: Apply 1 application topically daily as needed (access port). Apply to the Port-A-Cath site 30-60 minutes before treatment.) 30 g 0   lisinopril (ZESTRIL) 10 MG tablet Take 1 tablet (10 mg total) by mouth daily. 90 tablet 3   meclizine (ANTIVERT) 25 MG tablet Take 1 tablet (25 mg total) by mouth 3 (three) times daily as needed for dizziness. (Patient taking differently: Take 25 mg by mouth 3 (three) times daily. ) 90 tablet 3   metFORMIN (GLUCOPHAGE)  1000 MG tablet Take 1 tablet (1,000 mg total) by mouth 2 (two) times daily with a meal. 180 tablet 3   multivitamin (ONE-A-DAY MEN'S) TABS tablet Take 1 tablet by mouth daily. 30 tablet 6   nitroGLYCERIN (NITROSTAT) 0.4 MG SL tablet Place 1 tablet (0.4 mg total) under the tongue every 5 (five) minutes x 3 doses as needed for chest pain. 25 tablet 2   oxyCODONE-acetaminophen (PERCOCET/ROXICET) 5-325 MG tablet Take 1 tablet by mouth every 6 (six) hours as needed for severe pain. 30 tablet 0   pantoprazole (PROTONIX) 40 MG tablet Take 1 tablet (40 mg total) by mouth daily. (Patient not taking: Reported on 02/28/2020) 90 tablet 3   prochlorperazine (COMPAZINE) 10 MG tablet Take 1 tablet (10 mg total) by mouth every 6 (six) hours as needed for nausea or vomiting. 30 tablet 11   rosuvastatin (CRESTOR) 40 MG tablet Take 1 tablet (40 mg total) by mouth daily. 90 tablet 3   sucralfate (CARAFATE) 1 GM/10ML suspension  Take 10 mLs (1 g total) by mouth 4 (four) times daily. 420 mL 1   No current facility-administered medications for this visit.    SURGICAL HISTORY:  Past Surgical History:  Procedure Laterality Date   CARDIAC CATHETERIZATION N/A 09/05/2015   Procedure: Left Heart Cath and Coronary Angiography;  Surgeon: Troy Sine, MD;  Location: [facility];  Service: Cardiovascular;  Laterality: N/A;   CARDIAC CATHETERIZATION N/A 09/05/2015   Procedure: Coronary Stent Intervention;  Surgeon: Troy Sine, MD;  Location: [facility];  Service: Cardiovascular;  Laterality: N/A;   IR IMAGING GUIDED PORT INSERTION  07/15/2019   ORIF CONGENITAL HIP DISLOCATION Left ~ 1965   "put pins in it"   PERIPHERAL VASCULAR CATHETERIZATION Bilateral 2003   "stenting"   TONSILLECTOMY  ~ 1963    REVIEW OF SYSTEMS:   Review of Systems  Constitutional: Positive for fatigue. Negative for appetite change, chills, fever and unexpected weight change.  HENT: Positive for right parietal scalp  lesion. Negative for mouth sores, nosebleeds, sore throat and trouble swallowing.   Eyes: Negative for eye problems and icterus.  Respiratory: Positive for mild dyspnea on exertion. Negative for cough, hemoptysis, and wheezing.   Cardiovascular: Negative for chest pain and leg swelling.  Gastrointestinal: Positive for some constipation. Negative for abdominal pain, diarrhea, nausea and vomiting.  Genitourinary: Negative for bladder incontinence, difficulty urinating, dysuria, frequency and hematuria.   Musculoskeletal: Negative for back pain, gait problem, neck pain and neck stiffness.  Skin: Negative for itching and rash.  Neurological: Positive for dizziness. Negative for extremity weakness, gait problem, headaches, light-headedness and seizures.  Hematological: Negative for adenopathy. Does not bruise/bleed easily.  Psychiatric/Behavioral: Negative for confusion, depression and sleep disturbance. The patient is not nervous/anxious.     PHYSICAL EXAMINATION:  Blood pressure (!) 126/58, pulse 98, temperature 97.8 F (36.6 C), temperature source Tympanic, resp. rate 20, height 6\' 3"  (1.905 m), weight 220 lb 4.8 oz (99.9 kg), SpO2 100 %.  ECOG PERFORMANCE STATUS: 1 - Symptomatic but completely ambulatory  Physical Exam  Constitutional: Oriented to person, place, and time and well-developed, well-nourished, and in no distress.  HENT:  Head: Normocephalic and atraumatic.  Mouth/Throat: Oropharynx is clear and moist. No oropharyngeal exudate.  Eyes: Conjunctivae are normal. Right eye exhibits no discharge. Left eye exhibits no discharge. No scleral icterus.  Neck: Normal range of motion. Neck supple.  Cardiovascular: Normal rate, regular rhythm, normal heart sounds and intact distal pulses.   Pulmonary/Chest: Effort normal and breath sounds normal. No respiratory distress. No wheezes. No rales.  Abdominal: Soft. Bowel sounds are normal. Exhibits no distension and no mass. There is no  tenderness.  Musculoskeletal: Normal range of motion. Exhibits no edema.  Lymphadenopathy:    No cervical adenopathy.  Neurological: Alert and oriented to person, place, and time. Exhibits normal muscle tone. Gait normal. Coordination normal.  Skin: Positive for rounded, firm, mild erythematous, lesion on the right scalp without evidence of drainage. Skin is warm and dry.  Not diaphoretic.  No pallor.  Psychiatric: Mood, memory and judgment normal.  Vitals reviewed.  LABORATORY DATA: Lab Results  Component Value Date   WBC 8.0 02/29/2020   HGB 8.8 (L) 02/29/2020   HCT 28.1 (L) 02/29/2020   MCV 101.4 (H) 02/29/2020   PLT 341 02/29/2020      Chemistry      Component Value Date/Time   NA 139 02/10/2020 1120   NA 140 06/08/2015 0840   K 4.2  02/10/2020 1120   CL 105 02/10/2020 1120   CO2 24 02/10/2020 1120   BUN 18 02/10/2020 1120   BUN 15 06/08/2015 0840   CREATININE 1.45 (H) 02/10/2020 1120   CREATININE 1.06 10/01/2016 0855      Component Value Date/Time   CALCIUM 9.4 02/10/2020 1120   ALKPHOS 74 02/10/2020 1120   AST 28 02/10/2020 1120   ALT 21 02/10/2020 1120   BILITOT 0.3 02/10/2020 1120       RADIOGRAPHIC STUDIES:  No results found.   ASSESSMENT/PLAN:  This is a very pleasant 64 year old African-American male diagnosed with stage IV (TX, N3, M1C) non-small cell lung cancer, poorly differentiated adenocarcinoma. He presented with bulky mediastinal as well as right hilar, supraclavicular, abdominal, and right axillary lymphadenopathy in addition to metastatic disease to the liver. He was diagnosed in January 2021. The patient does not have any actionable mutations. The patient was not a candidate for treatment with targeted therapy or enrollment in a clinical trial with the [facility] regimen.  The patient is currently undergoing systemic chemotherapy with carboplatin for an AUC of 5, Alimta 500 mg per metered squared, Keytruda 200 mg IV every 3 weeks. He is status post  11 cycles. Starting from cycle #5 patient has been on maintenance Alimta and Keytruda IV every 3 weeks.  At his last appointment, Dr. [name] offered comfort care versus continuing on the same treatment. The patient is interested in proceeding with cycle #12 today; however, he will receive Keytruda only due to his increased serum creatinine. Due to the patient being a Jehovah's Witness, he has persistent anemia in which he is currently on oral iron tablets.  I will arrange for restaging CT scan of the chest, abdomen, and pelvis prior to starting his next cycle of treatment. We will also arrange for a CT scan of the head without contrast to rule out metastatic disease, further evaluate his dizziness, and to evaluate the parietal scalp lesion on the right. I will order the scans without contrast due to his renal insufficiency.   We will see the patient back for follow-up visit in 3 weeks for evaluation and to review his scan results before starting cycle #13.  The patient was advised to call immediately if he has any concerning symptoms in the interval. The patient voices understanding of current disease status and treatment options and is in agreement with the current care plan. All questions were answered. The patient knows to call the clinic with any problems, questions or concerns. We can certainly see the patient much sooner if necessary      Orders Placed This Encounter  Procedures   CT Chest W Contrast    Standing Status:   Future    Standing Expiration Date:   02/28/2021    Order Specific Question:   If indicated for the ordered procedure, I authorize the administration of contrast media per Radiology protocol    Answer:   Yes    Order Specific Question:   Preferred imaging location?    Answer:   [facility]   CT Abdomen Pelvis W Contrast    Standing Status:   Future    Standing Expiration Date:   02/28/2021    Order Specific Question:   If indicated for the ordered  procedure, I authorize the administration of contrast media per Radiology protocol    Answer:   Yes    Order Specific Question:   Preferred imaging location?    Answer:   [facility]    Order Specific Question:   Is Oral Contrast requested for this exam?    Answer:   Yes, Per Radiology protocol   CT Head W Wo Contrast    Standing Status:   Future    Standing Expiration Date:   02/28/2021    Order Specific Question:   If indicated for the ordered procedure, I authorize the administration of contrast media per Radiology protocol    Answer:   Yes    Order Specific Question:   Preferred imaging location?    Answer:   [facility]   TSH    Standing Status:   Standing    Number of Occurrences:   18    Standing Expiration Date:   02/28/2021     Tobe Sos Lashanti Chambless, PA-C 02/29/20  ADDENDUM: Hematology/Oncology Attending: I had a face-to-face encounter with the patient today.  I recommended his care plan.  This is a very pleasant 64 years old African-American male with metastatic non-small cell lung cancer, adenocarcinoma status post 4 cycles of systemic chemotherapy with carboplatin, Alimta and Keytruda with partial response.  He is currently on maintenance treatment with Alimta and Keytruda every 3 weeks status post additional 7 cycles.  The patient continues to tolerate his treatment well except for the fatigue.  He has lack of stamina recently.  Most of his symptoms are likely related to the anemia of chronic disease plus/minus chemotherapy-induced anemia.  The patient is Jehovah's Witness and he refused PRBCs transfusion. His thought at some point to discontinue his treatment but after discussion with his family and his primary care physician he decided to continue treatment. He will proceed with cycle #12 today but with only single agent Keytruda because of the renal insufficiency. The patient will come back for follow-up visit in 3 weeks for evaluation with repeat CT  scan of the chest, abdomen pelvis. The patient was advised to call immediately if he has any concerning symptoms in the interval.  Disclaimer: This note was dictated with voice recognition software. Similar sounding words can inadvertently be transcribed and may be missed upon review. Eilleen Kempf, MD 02/29/20

## 2020-02-28 ENCOUNTER — Telehealth (INDEPENDENT_AMBULATORY_CARE_PROVIDER_SITE_OTHER): Payer: Medicare Other | Admitting: Family Medicine

## 2020-02-28 ENCOUNTER — Other Ambulatory Visit: Payer: Self-pay

## 2020-02-28 DIAGNOSIS — G620 Drug-induced polyneuropathy: Secondary | ICD-10-CM

## 2020-02-28 DIAGNOSIS — C3490 Malignant neoplasm of unspecified part of unspecified bronchus or lung: Secondary | ICD-10-CM

## 2020-02-28 DIAGNOSIS — R42 Dizziness and giddiness: Secondary | ICD-10-CM

## 2020-02-28 DIAGNOSIS — I739 Peripheral vascular disease, unspecified: Secondary | ICD-10-CM

## 2020-02-28 DIAGNOSIS — R600 Localized edema: Secondary | ICD-10-CM

## 2020-02-28 DIAGNOSIS — I10 Essential (primary) hypertension: Secondary | ICD-10-CM

## 2020-02-28 DIAGNOSIS — T451X5A Adverse effect of antineoplastic and immunosuppressive drugs, initial encounter: Secondary | ICD-10-CM

## 2020-02-28 DIAGNOSIS — Z09 Encounter for follow-up examination after completed treatment for conditions other than malignant neoplasm: Secondary | ICD-10-CM

## 2020-02-28 MED ORDER — GABAPENTIN 300 MG PO CAPS: 300.0000 mg | ORAL_CAPSULE | Freq: Every day | ORAL | 1 refills | Status: DC

## 2020-02-28 MED ORDER — FUROSEMIDE 20 MG PO TABS: 20.0000 mg | ORAL_TABLET | Freq: Two times a day (BID) | ORAL | 1 refills | Status: DC

## 2020-02-28 NOTE — Progress Notes (Deleted)
Patient [facility]    Established Patient Office Visit  Subjective:  Patient ID: Kirk [name], male    DOB: [birth year]  Age: 64 y.o. MRN: [id number]  CC:  Chief Complaint  Patient presents with  . Follow-up    Pt states when he lay down at nithr he has some throbbing in his feet and toes.x3-4 wks.    HPI Kirk [name] presents for ***   Patient Active Problem List   Diagnosis Date Noted  . Port-A-Cath in place 12/29/2019  . Metastatic non-small cell lung cancer ([place]) 07/07/2019  . Encounter for antineoplastic chemotherapy 07/07/2019  . Encounter for antineoplastic immunotherapy 07/07/2019  . Goals of care, counseling/discussion 06/28/2019  . Adenocarcinoma of right lung, stage 4 ([place]) 06/28/2019  . Cancer associated pain 06/28/2019  . CAD (coronary artery disease) 06/08/2019  . Mediastinal lymphadenopathy 06/03/2019  . Liver metastasis ([name]) 06/03/2019  . Hemoglobin A1C between 7% and 9% indicating borderline diabetic control 05/10/2019  . COVID-19 virus infection 05/10/2019  . Atrial flutter with rapid ventricular response ([place]) 04/19/2019  . Chest pain with moderate risk for cardiac etiology 04/19/2019  . Paroxysmal atrial flutter ([place]) 04/19/2019  . Chest congestion 07/10/2018  . Cough 07/10/2018  . NSTEMI (non-ST elevated myocardial infarction) ([place]) 09/04/2015  . PVD (peripheral vascular disease) ([place]) 06/08/2015  . DDD (degenerative disc disease), cervical 06/08/2015  . Hyperlipidemia 06/08/2015  . Diabetes mellitus with nephropathy ([place]) 06/08/2015  . Diabetes mellitus with peripheral vascular disease ([place]) 06/08/2015  . Annual physical exam 01/03/2015  . PAD (peripheral artery disease) ([place]) 02/18/2013  . Diabetes ([place]) 02/18/2013  . HTN (hypertension) 02/18/2013    Current Status: Since *** last office visit, *** is doing well with no complaints.    He has c/o bilateral foot edema and pain, with L>R. He has  been taking pain medication for minor relief.     *** denies fevers, chills, fatigue, recent infections, weight loss, and night sweats. *** has not had any headaches, visual changes, dizziness, and falls. No chest pain, heart palpitations, cough and shortness of breath reported. Denies GI problems such as nausea, vomiting, diarrhea, and constipation. *** has no reports of blood in stools, dysuria and hematuria. No depression or anxiety, and denies suicidal ideations, homicidal ideations, or auditory hallucinations. *** is *** all medications as prescribed. *** denies pain today.      Past Medical History:  Diagnosis Date  . Arthritis    "right knee" (09/04/2015)  . Atrial flutter with rapid ventricular response ([place]) 04/19/2019  . Dizziness 10/2019  . Dyspnea    increased exertion  . HTN (hypertension)   . Hyperlipidemia   . Lung cancer ([place]) 05/2019  . NSTEMI (non-ST elevated myocardial infarction) ([place]) 09/04/2015   a. cath 09/05/2015: 95% stenosis mid-LAD, 55% mid-RCA, and 40% prox-RCA. PCI performed w/ Synergy DES to LAD  . PAD (peripheral artery disease) ([facility])    Stenting of bilateral iliacs in 2003.  [name] Refusal of blood transfusions as patient is Jehovah's Witness   . Type II diabetes mellitus ([place])     Past Surgical History:  Procedure Laterality Date  . CARDIAC CATHETERIZATION N/A 09/05/2015   Procedure: Left Heart Cath and Coronary Angiography;  Surgeon: Troy Sine, MD;  Location: [facility];  Service: Cardiovascular;  Laterality: N/A;  . CARDIAC CATHETERIZATION N/A 09/05/2015   Procedure: Coronary Stent Intervention;  Surgeon: Troy Sine, MD;  Location: [facility];  Service: Cardiovascular;  Laterality: N/A;  . IR IMAGING GUIDED PORT INSERTION  07/15/2019  . 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 PERIPHERAL VASCULAR CATHETERIZATION Bilateral 2003   "stenting"  . TONSILLECTOMY  ~ 1963    Family History  Problem Relation  Age of Onset  . Lymphoma Mother   . Cancer - Prostate Father   . CAD Neg Hx   . Colon cancer Neg Hx   . Rectal cancer Neg Hx   . Stomach cancer Neg Hx   . Esophageal cancer Neg Hx     Social History   Socioeconomic History  . Marital status: Married    Spouse name: Not on file  . Number of children: 1  . Years of education: Not on file  . Highest education level: Not on file  Occupational History  . Occupation: Acupuncturist  Tobacco Use  . Smoking status: Former Smoker    Packs/day: 2.00    Years: 35.00    Pack years: 70.00    Types: Cigarettes    Quit date: 02/19/2003    Years since quitting: 17.0  . Smokeless tobacco: Never Used  Vaping Use  . Vaping Use: Never used  Substance and Sexual Activity  . Alcohol use: Not Currently    Alcohol/week: 0.0 standard drinks    Comment: 09/04/2015 "nothing since 1980s"  . 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 Sexual activity: Not Currently  Other Topics Concern  . Not on file  Social History Narrative  . Not on file   Social Determinants of Health   Financial Resource Strain:   . Difficulty of Paying Living Expenses: Not on file  Food Insecurity:   . Worried About Charity fundraiser in the Last Year: Not on file  . Ran Out of Food in the Last Year: Not on file  Transportation Needs:   . Lack of Transportation (Medical): Not on file  . Lack of Transportation (Non-Medical): Not on file  Physical Activity:   . Days of Exercise per Week: Not on file  . Minutes of Exercise per Session: Not on file  Stress:   . Feeling of Stress : Not on file  Social Connections:   . Frequency of Communication with Friends and Family: Not on file  . Frequency of Social Gatherings with Friends and Family: Not on file  . Attends Religious Services: Not on file  . Active Member of Clubs or Organizations: Not on file  . Attends Archivist Meetings: Not on file  . Marital Status: Not on  file  Intimate Partner Violence:   . Fear of Current or Ex-Partner: Not on file  . Emotionally Abused: Not on file  . Physically Abused: Not on file  . Sexually Abused: Not on file    Outpatient Medications Prior to Visit  Medication Sig Dispense Refill  . acetaminophen (TYLENOL) 500 MG tablet Take 500 mg by mouth every 6 (six) hours as needed for moderate pain or fever.     [name] aspirin EC 81 MG tablet Take 81 mg by mouth at bedtime.    . carvedilol (COREG) 12.5 MG tablet Take 1 tablet (12.5 mg total) by mouth 2 (two) times daily with a meal. 180 tablet 3  . cetirizine (ZYRTEC) 10 MG tablet Take 1 tablet (10 mg total) by mouth daily. 90 tablet 3  . clopidogrel (PLAVIX) 75  MG tablet Take 1 tablet (75 mg total) by mouth daily. 90 tablet 3  . docusate sodium (COLACE) 100 MG capsule Take 1 capsule (100 mg total) by mouth 2 (two) times daily as needed for mild constipation. 60 capsule 11  . ferrous sulfate 324 MG TBEC Take 1 tablet (324 mg total) by mouth daily with breakfast. 30 tablet 11  . folic acid (FOLVITE) 1 MG tablet Take 1 tablet (1 mg total) by mouth daily. 90 tablet 3  . glimepiride (AMARYL) 4 MG tablet Take 1 tablet (4 mg total) by mouth daily. 90 tablet 3  . Hydrocortisone Acetate 1 % CREA Apply 15 g topically 2 (two) times daily. 15 g 3  . ipratropium (ATROVENT) 0.03 % nasal spray USE 2 SPRAYS IN EACH NOSTRIL TWICE DAILY (Patient taking differently: Place 1 spray into both nostrils daily as needed (allergies). ) 30 mL 2  . lactulose (CHRONULAC) 10 GM/15ML solution TAKE 30 MLS (20 G TOTAL) BY MOUTH 2 (TWO) TIMES DAILY AS NEEDED FOR MODERATE CONSTIPATION. 720 mL 1  . lactulose (CHRONULAC) 10 GM/15ML solution Take 30 mLs (20 g total) by mouth 2 (two) times daily as needed for moderate constipation. 240 mL 11  . lidocaine-prilocaine (EMLA) cream APPLY TO THE PORT-A-CATH SITE 30-60 MINUTES BEFORE TREATMENT. (Patient taking differently: Apply 1 application topically daily as needed (access  port). Apply to the Port-A-Cath site 30-60 minutes before treatment.) 30 g 0  . lisinopril (ZESTRIL) 10 MG tablet Take 1 tablet (10 mg total) by mouth daily. 90 tablet 3  . meclizine (ANTIVERT) 25 MG tablet Take 1 tablet (25 mg total) by mouth 3 (three) times daily as needed for dizziness. (Patient taking differently: Take 25 mg by mouth 3 (three) times daily. ) 90 tablet 3  . metFORMIN (GLUCOPHAGE) 1000 MG tablet Take 1 tablet (1,000 mg total) by mouth 2 (two) times daily with a meal. 180 tablet 3  . multivitamin (ONE-A-DAY MEN'S) TABS tablet Take 1 tablet by mouth daily. 30 tablet 6  . nitroGLYCERIN (NITROSTAT) 0.4 MG SL tablet Place 1 tablet (0.4 mg total) under the tongue every 5 (five) minutes x 3 doses as needed for chest pain. 25 tablet 2  . oxyCODONE-acetaminophen (PERCOCET/ROXICET) 5-325 MG tablet Take 1 tablet by mouth every 6 (six) hours as needed for severe pain. 30 tablet 0  . prochlorperazine (COMPAZINE) 10 MG tablet Take 1 tablet (10 mg total) by mouth every 6 (six) hours as needed for nausea or vomiting. 30 tablet 11  . rosuvastatin (CRESTOR) 40 MG tablet Take 1 tablet (40 mg total) by mouth daily. 90 tablet 3  . sucralfate (CARAFATE) 1 GM/10ML suspension Take 10 mLs (1 g total) by mouth 4 (four) times daily. 420 mL 1  . pantoprazole (PROTONIX) 40 MG tablet Take 1 tablet (40 mg total) by mouth daily. (Patient not taking: Reported on 02/28/2020) 90 tablet 3   No facility-administered medications prior to visit.    No Known Allergies  ROS Review of Systems    Objective:    Physical Exam  There were no vitals taken for this visit. Wt Readings from Last 3 Encounters:  02/10/20 222 lb 12.8 oz (101.1 kg)  01/18/20 218 lb 3.2 oz (99 kg)  12/29/19 216 lb 3.2 oz (98.1 kg)     Health Maintenance Due  Topic Date Due  . FOOT EXAM  07/02/2017  . OPHTHALMOLOGY EXAM  07/11/2018  . COVID-19 Vaccine (2 - Pfizer 2-dose series) 02/08/2020    There are  no preventive care reminders  to display for this patient.  Lab Results  Component Value Date   TSH 0.740 02/10/2020   Lab Results  Component Value Date   WBC 7.3 02/10/2020   HGB 9.1 (L) 02/10/2020   HCT 28.8 (L) 02/10/2020   MCV 102.5 (H) 02/10/2020   PLT 330 02/10/2020   Lab Results  Component Value Date   NA 139 02/10/2020   K 4.2 02/10/2020   CO2 24 02/10/2020   GLUCOSE 178 (H) 02/10/2020   BUN 18 02/10/2020   CREATININE 1.45 (H) 02/10/2020   BILITOT 0.3 02/10/2020   ALKPHOS 74 02/10/2020   AST 28 02/10/2020   ALT 21 02/10/2020   PROT 8.2 (H) 02/10/2020   ALBUMIN 3.6 02/10/2020   CALCIUM 9.4 02/10/2020   ANIONGAP 10 02/10/2020   GFR 101.07 05/31/2019   Lab Results  Component Value Date   CHOL 102 07/22/2019   Lab Results  Component Value Date   HDL 34 (L) 07/22/2019   Lab Results  Component Value Date   LDLCALC 48 07/22/2019   Lab Results  Component Value Date   TRIG 106 07/22/2019   Lab Results  Component Value Date   CHOLHDL 3.0 07/22/2019   Lab Results  Component Value Date   HGBA1C 6.5 (A) 11/22/2019   HGBA1C 6.5 11/22/2019   HGBA1C 6.5 (A) 11/22/2019   HGBA1C 6.5 11/22/2019      Assessment & Plan:   Problem List Items Addressed This Visit    None      No orders of the defined types were placed in this encounter.   Follow-up: No follow-ups on file.    [name], FNP

## 2020-02-29 ENCOUNTER — Inpatient Hospital Stay: Payer: Medicare Other

## 2020-02-29 ENCOUNTER — Encounter: Payer: Self-pay | Admitting: Physician Assistant

## 2020-02-29 ENCOUNTER — Other Ambulatory Visit: Payer: Self-pay

## 2020-02-29 ENCOUNTER — Inpatient Hospital Stay: Payer: Medicare Other | Attending: Internal Medicine

## 2020-02-29 ENCOUNTER — Inpatient Hospital Stay (HOSPITAL_BASED_OUTPATIENT_CLINIC_OR_DEPARTMENT_OTHER): Payer: Medicare Other | Admitting: Physician Assistant

## 2020-02-29 VITALS — BP 126/58 | HR 98 | Temp 97.8°F | Resp 20 | Ht 75.0 in | Wt 220.3 lb

## 2020-02-29 DIAGNOSIS — R5383 Other fatigue: Secondary | ICD-10-CM | POA: Insufficient documentation

## 2020-02-29 DIAGNOSIS — R42 Dizziness and giddiness: Secondary | ICD-10-CM | POA: Insufficient documentation

## 2020-02-29 DIAGNOSIS — Z7984 Long term (current) use of oral hypoglycemic drugs: Secondary | ICD-10-CM | POA: Diagnosis not present

## 2020-02-29 DIAGNOSIS — I2510 Atherosclerotic heart disease of native coronary artery without angina pectoris: Secondary | ICD-10-CM

## 2020-02-29 DIAGNOSIS — I252 Old myocardial infarction: Secondary | ICD-10-CM | POA: Insufficient documentation

## 2020-02-29 DIAGNOSIS — E1151 Type 2 diabetes mellitus with diabetic peripheral angiopathy without gangrene: Secondary | ICD-10-CM | POA: Diagnosis not present

## 2020-02-29 DIAGNOSIS — R600 Localized edema: Secondary | ICD-10-CM | POA: Diagnosis not present

## 2020-02-29 DIAGNOSIS — C3491 Malignant neoplasm of unspecified part of right bronchus or lung: Secondary | ICD-10-CM

## 2020-02-29 DIAGNOSIS — Z5111 Encounter for antineoplastic chemotherapy: Secondary | ICD-10-CM

## 2020-02-29 DIAGNOSIS — Z9221 Personal history of antineoplastic chemotherapy: Secondary | ICD-10-CM | POA: Insufficient documentation

## 2020-02-29 DIAGNOSIS — E785 Hyperlipidemia, unspecified: Secondary | ICD-10-CM | POA: Diagnosis not present

## 2020-02-29 DIAGNOSIS — D509 Iron deficiency anemia, unspecified: Secondary | ICD-10-CM | POA: Diagnosis not present

## 2020-02-29 DIAGNOSIS — Z7982 Long term (current) use of aspirin: Secondary | ICD-10-CM | POA: Insufficient documentation

## 2020-02-29 DIAGNOSIS — I10 Essential (primary) hypertension: Secondary | ICD-10-CM | POA: Insufficient documentation

## 2020-02-29 DIAGNOSIS — I739 Peripheral vascular disease, unspecified: Secondary | ICD-10-CM | POA: Insufficient documentation

## 2020-02-29 DIAGNOSIS — Z79899 Other long term (current) drug therapy: Secondary | ICD-10-CM | POA: Insufficient documentation

## 2020-02-29 DIAGNOSIS — Z95828 Presence of other vascular implants and grafts: Secondary | ICD-10-CM

## 2020-02-29 DIAGNOSIS — C3490 Malignant neoplasm of unspecified part of unspecified bronchus or lung: Secondary | ICD-10-CM

## 2020-02-29 DIAGNOSIS — Z5112 Encounter for antineoplastic immunotherapy: Secondary | ICD-10-CM | POA: Diagnosis not present

## 2020-02-29 DIAGNOSIS — C787 Secondary malignant neoplasm of liver and intrahepatic bile duct: Secondary | ICD-10-CM | POA: Diagnosis not present

## 2020-02-29 DIAGNOSIS — Z23 Encounter for immunization: Secondary | ICD-10-CM | POA: Insufficient documentation

## 2020-02-29 LAB — CBC WITH DIFFERENTIAL (CANCER CENTER ONLY)
Abs Immature Granulocytes: 0.01 10*3/uL (ref 0.00–0.07)
Basophils Absolute: 0 10*3/uL (ref 0.0–0.1)
Basophils Relative: 1 %
Eosinophils Absolute: 0.1 10*3/uL (ref 0.0–0.5)
Eosinophils Relative: 2 %
HCT: 28.1 % — ABNORMAL LOW (ref 39.0–52.0)
Hemoglobin: 8.8 g/dL — ABNORMAL LOW (ref 13.0–17.0)
Immature Granulocytes: 0 %
Lymphocytes Relative: 28 %
Lymphs Abs: 2.3 10*3/uL (ref 0.7–4.0)
MCH: 31.8 pg (ref 26.0–34.0)
MCHC: 31.3 g/dL (ref 30.0–36.0)
MCV: 101.4 fL — ABNORMAL HIGH (ref 80.0–100.0)
Monocytes Absolute: 1 10*3/uL (ref 0.1–1.0)
Monocytes Relative: 12 %
Neutro Abs: 4.6 10*3/uL (ref 1.7–7.7)
Neutrophils Relative %: 57 %
Platelet Count: 341 10*3/uL (ref 150–400)
RBC: 2.77 MIL/uL — ABNORMAL LOW (ref 4.22–5.81)
RDW: 16.4 % — ABNORMAL HIGH (ref 11.5–15.5)
WBC Count: 8 10*3/uL (ref 4.0–10.5)
nRBC: 0 % (ref 0.0–0.2)

## 2020-02-29 LAB — CMP (CANCER CENTER ONLY)
ALT: 28 U/L (ref 0–44)
AST: 34 U/L (ref 15–41)
Albumin: 3.4 g/dL — ABNORMAL LOW (ref 3.5–5.0)
Alkaline Phosphatase: 63 U/L (ref 38–126)
Anion gap: 10 (ref 5–15)
BUN: 19 mg/dL (ref 8–23)
CO2: 25 mmol/L (ref 22–32)
Calcium: 9.7 mg/dL (ref 8.9–10.3)
Chloride: 105 mmol/L (ref 98–111)
Creatinine: 1.76 mg/dL — ABNORMAL HIGH (ref 0.61–1.24)
GFR, Estimated: 39 mL/min — ABNORMAL LOW (ref >60)
Glucose, Bld: 192 mg/dL — ABNORMAL HIGH (ref 70–99)
Potassium: 4 mmol/L (ref 3.5–5.1)
Sodium: 140 mmol/L (ref 135–145)
Total Bilirubin: <0.2 mg/dL — ABNORMAL LOW (ref 0.3–1.2)
Total Protein: 8.4 g/dL — ABNORMAL HIGH (ref 6.5–8.1)

## 2020-02-29 MED ORDER — HEPARIN SOD (PORK) LOCK FLUSH 100 UNIT/ML IV SOLN
500.0000 [IU] | Freq: Once | INTRAVENOUS | Status: AC | PRN
  Administered 2020-02-29: 500 [IU]
  Filled 2020-02-29: qty 5

## 2020-02-29 MED ORDER — SODIUM CHLORIDE 0.9 % IV SOLN
200.0000 mg | Freq: Once | INTRAVENOUS | Status: AC
  Administered 2020-02-29: 200 mg via INTRAVENOUS
  Filled 2020-02-29: qty 8

## 2020-02-29 MED ORDER — SODIUM CHLORIDE 0.9 % IV SOLN
Freq: Once | INTRAVENOUS | Status: AC
  Filled 2020-02-29: qty 250

## 2020-02-29 MED ORDER — SODIUM CHLORIDE 0.9% FLUSH
10.0000 mL | INTRAVENOUS | Status: DC | PRN
  Administered 2020-02-29: 10 mL
  Filled 2020-02-29: qty 10

## 2020-02-29 MED ORDER — SODIUM CHLORIDE 0.9% FLUSH
10.0000 mL | Freq: Once | INTRAVENOUS | Status: AC
  Administered 2020-02-29: 10 mL
  Filled 2020-02-29: qty 10

## 2020-02-29 MED ORDER — PROCHLORPERAZINE MALEATE 10 MG PO TABS: 10.0000 mg | ORAL_TABLET | Freq: Once | ORAL | Status: DC

## 2020-02-29 NOTE — Progress Notes (Signed)
Per Cassie H, PA, Alimta will be held today due to elevated creatinine.

## 2020-02-29 NOTE — Progress Notes (Signed)
MD holding Alimta today due to elevated Scr. Alimta discontinued from today's orders in the treatment plan.  Hardie Pulley, PharmD, BCPS, BCOP

## 2020-02-29 NOTE — Patient Instructions (Signed)
[facility] Discharge Instructions for Patients Receiving Chemotherapy  Today you received the following chemotherapy agents: Beryle Flock  To help prevent nausea and vomiting after your treatment, we encourage you to take your nausea medication as directed.    If you develop nausea and vomiting that is not controlled by your nausea medication, call the clinic.   BELOW ARE SYMPTOMS THAT SHOULD BE REPORTED IMMEDIATELY:  *FEVER GREATER THAN 100.5 F  *CHILLS WITH OR WITHOUT FEVER  NAUSEA AND VOMITING THAT IS NOT CONTROLLED WITH YOUR NAUSEA MEDICATION  *UNUSUAL SHORTNESS OF BREATH  *UNUSUAL BRUISING OR BLEEDING  TENDERNESS IN MOUTH AND THROAT WITH OR WITHOUT PRESENCE OF ULCERS  *URINARY PROBLEMS  *BOWEL PROBLEMS  UNUSUAL RASH Items with * indicate a potential emergency and should be followed up as soon as possible.  Feel free to call the clinic should you have any questions or concerns. The clinic phone number is [phone].  Please show the [facility] at check-in to the Emergency Department and triage nurse.

## 2020-03-04 ENCOUNTER — Encounter: Payer: Self-pay | Admitting: Family Medicine

## 2020-03-04 NOTE — Progress Notes (Signed)
Virtual Visit via Telephone Note  I connected with Kirk [name] on 03/04/20 at  3:20 PM EDT by telephone and verified that I am speaking with the correct person using two identifiers.   I discussed the limitations, risks, security and privacy concerns of performing an evaluation and management service by telephone and the availability of in person appointments. I also discussed with the patient that there may be a patient responsible charge related to this service. The patient expressed understanding and agreed to proceed.  Televisit Today Patient Location: Home Provider Location: Office   History of Present Illness:  Patient Active Problem List   Diagnosis Date Noted  . Port-A-Cath in place 12/29/2019  . Metastatic non-small cell lung cancer ([place]) 07/07/2019  . Encounter for antineoplastic chemotherapy 07/07/2019  . Encounter for antineoplastic immunotherapy 07/07/2019  . Goals of care, counseling/discussion 06/28/2019  . Adenocarcinoma of right lung, stage 4 ([place]) 06/28/2019  . Cancer associated pain 06/28/2019  . CAD (coronary artery disease) 06/08/2019  . Mediastinal lymphadenopathy 06/03/2019  . Liver metastasis ([place]) 06/03/2019  . Hemoglobin A1C between 7% and 9% indicating borderline diabetic control 05/10/2019  . COVID-19 virus infection 05/10/2019  . Atrial flutter with rapid ventricular response ([place]) 04/19/2019  . Chest pain with moderate risk for cardiac etiology 04/19/2019  . Paroxysmal atrial flutter ([place]) 04/19/2019  . Chest congestion 07/10/2018  . Cough 07/10/2018  . NSTEMI (non-ST elevated myocardial infarction) ([place]) 09/04/2015  . PVD (peripheral vascular disease) ([place]) 06/08/2015  . DDD (degenerative disc disease), cervical 06/08/2015  . Hyperlipidemia 06/08/2015  . Diabetes mellitus with nephropathy ([place]) 06/08/2015  . Diabetes mellitus with peripheral vascular disease ([place]) 06/08/2015  . Annual physical exam 01/03/2015  . PAD (peripheral artery disease)  ([place]) 02/18/2013  . Diabetes ([place]) 02/18/2013  . HTN (hypertension) 02/18/2013    Current Status: Since his last office visit, he has c/o bilateral foot edema and pain, with L>R. He has been taking pain medication for minor relief. He denies fevers, chills, fatigue, recent infections, weight loss, and night sweats. He has not had any headaches, visual changes, dizziness, and falls. No chest pain, heart palpitations, cough and shortness of breath reported. Denies GI problems such as nausea, vomiting, diarrhea, and constipation. He has no reports of blood in stools, dysuria and hematuria. No depression or anxiety reported today. He is taking all medications as prescribed. He denies pain today.    Observations/Objective:  Telephone Visit   Assessment and Plan:  1. Metastatic non-small cell lung cancer (Kirk [name])  2. Chemotherapy-induced neuropathy (Kirk [name]) He will continue chemotherapy treatments as prescribed.  - gabapentin (NEURONTIN) 300 MG capsule; Take 1 capsule (300 mg total) by mouth at bedtime.  Dispense: 30 capsule; Refill: 1  3. PVD (peripheral vascular disease) (Kirk [name])  4. Bilateral lower extremity edema - furosemide (LASIX) 20 MG tablet; Take 1 tablet (20 mg total) by mouth 2 (two) times daily.  Dispense: 60 tablet; Refill: 1  5. Essential hypertension He will continue to take medications as prescribed, to decrease high sodium intake, excessive alcohol intake, increase potassium intake, smoking cessation, and increase physical activity of at least 30 minutes of cardio activity daily. He will continue to follow Heart Healthy or DASH diet.  6. Dizziness Occasional. Continue Meclizine as prescribed. Continue to ambulate carefully.   7. Follow up He will follow up in 2 months.   Meds ordered this encounter  Medications  . furosemide (LASIX) 20 MG tablet    Sig: Take 1 tablet (20 mg total)  by mouth 2 (two) times daily.    Dispense:  60 tablet    Refill:  1  . gabapentin  (NEURONTIN) 300 MG capsule    Sig: Take 1 capsule (300 mg total) by mouth at bedtime.    Dispense:  30 capsule    Refill:  1   No orders of the defined types were placed in this encounter.   Referral Orders  No referral(s) requested today    Kathe Becton,  MSN, FNP-BC Kirk [name] [address] [place], Kirk [name] [address] [phone] fax  I discussed the assessment and treatment plan with the patient. The patient was provided an opportunity to ask questions and all were answered. The patient agreed with the plan and demonstrated an understanding of the instructions.   The patient was advised to call back or seek an in-person evaluation if the symptoms worsen or if the condition fails to improve as anticipated.  I provided 20 minutes of non-face-to-face time during this encounter.   [name], FNP

## 2020-03-16 ENCOUNTER — Encounter (HOSPITAL_COMMUNITY): Payer: Self-pay

## 2020-03-16 ENCOUNTER — Ambulatory Visit (HOSPITAL_COMMUNITY)
Admission: RE | Admit: 2020-03-16 | Discharge: 2020-03-16 | Disposition: A | Discharge status: Home / Self Care | Payer: Medicare Other | Source: Ambulatory Visit | Attending: Physician Assistant | Admitting: Physician Assistant

## 2020-03-16 ENCOUNTER — Other Ambulatory Visit: Payer: Self-pay

## 2020-03-16 DIAGNOSIS — C3491 Malignant neoplasm of unspecified part of right bronchus or lung: Secondary | ICD-10-CM | POA: Diagnosis present

## 2020-03-20 ENCOUNTER — Telehealth: Payer: Self-pay | Admitting: Medical Oncology

## 2020-03-20 ENCOUNTER — Emergency Department (HOSPITAL_COMMUNITY)
Admission: EM | Admit: 2020-03-20 | Discharge: 2020-03-20 | Disposition: A | Discharge status: Home / Self Care | Payer: Medicare Other | Attending: Emergency Medicine | Admitting: Emergency Medicine

## 2020-03-20 ENCOUNTER — Other Ambulatory Visit: Payer: Self-pay

## 2020-03-20 ENCOUNTER — Emergency Department (HOSPITAL_COMMUNITY): Payer: Medicare Other

## 2020-03-20 ENCOUNTER — Encounter (HOSPITAL_COMMUNITY): Payer: Self-pay

## 2020-03-20 ENCOUNTER — Emergency Department (HOSPITAL_BASED_OUTPATIENT_CLINIC_OR_DEPARTMENT_OTHER): Payer: Medicare Other

## 2020-03-20 DIAGNOSIS — Z7984 Long term (current) use of oral hypoglycemic drugs: Secondary | ICD-10-CM | POA: Insufficient documentation

## 2020-03-20 DIAGNOSIS — E1140 Type 2 diabetes mellitus with diabetic neuropathy, unspecified: Secondary | ICD-10-CM | POA: Insufficient documentation

## 2020-03-20 DIAGNOSIS — M79662 Pain in left lower leg: Secondary | ICD-10-CM | POA: Insufficient documentation

## 2020-03-20 DIAGNOSIS — Z8616 Personal history of COVID-19: Secondary | ICD-10-CM | POA: Diagnosis not present

## 2020-03-20 DIAGNOSIS — Z79899 Other long term (current) drug therapy: Secondary | ICD-10-CM | POA: Insufficient documentation

## 2020-03-20 DIAGNOSIS — M7989 Other specified soft tissue disorders: Secondary | ICD-10-CM | POA: Diagnosis not present

## 2020-03-20 DIAGNOSIS — Z7982 Long term (current) use of aspirin: Secondary | ICD-10-CM | POA: Insufficient documentation

## 2020-03-20 DIAGNOSIS — Z85118 Personal history of other malignant neoplasm of bronchus and lung: Secondary | ICD-10-CM | POA: Insufficient documentation

## 2020-03-20 DIAGNOSIS — R2242 Localized swelling, mass and lump, left lower limb: Secondary | ICD-10-CM | POA: Insufficient documentation

## 2020-03-20 DIAGNOSIS — Z87891 Personal history of nicotine dependence: Secondary | ICD-10-CM | POA: Diagnosis not present

## 2020-03-20 DIAGNOSIS — I10 Essential (primary) hypertension: Secondary | ICD-10-CM | POA: Diagnosis not present

## 2020-03-20 LAB — CBG MONITORING, ED: Glucose-Capillary: 118 mg/dL — ABNORMAL HIGH (ref 70–99)

## 2020-03-20 NOTE — Telephone Encounter (Signed)
In ED about Left foot-'Sore"

## 2020-03-20 NOTE — ED Notes (Signed)
When asked if patient has a legal guardian he stated that he does not have a legal guardian, and does not know why that is in the chart. Patient is A&O x4.

## 2020-03-20 NOTE — Discharge Instructions (Addendum)
Please begin wearing compression stockings to help with swelling.  Elevate your leg is much as possible. Continue treating your symptoms with prescribed medications as directed by your primary care provider.  Please  follow closely with your primary care provider regarding your visit today. Return to the emergency department for fever, severely worsening pain, redness, or new concerning symptoms.

## 2020-03-20 NOTE — Progress Notes (Signed)
Lower extremity venous LT study completed.  Preliminary results relayed to provider in ED.   See CV Proc for preliminary results report.   Darlin Coco, RDMS

## 2020-03-20 NOTE — ED Triage Notes (Signed)
Patient c/o left leg and left foot pain x 1 month or more. Patient states he was diagnosed with neuropathy and has been prescribed Gabapentin. patient states Gabapentin not helping. [id number]-[id number] in triage.

## 2020-03-20 NOTE — ED Provider Notes (Signed)
[facility] DEPT Provider Note   CSN: [id number] Arrival date & time: 03/20/20  1450     History Chief Complaint  Patient presents with  . Foot Pain  . Leg Pain    Kirk [name] is a 64 y.o. male w PMHx [place] Lung cancer on active chemotherapy, T2DM, diabetic neuropathy, HTN, COVID-19, presenting to the ED with 1 month of gradually worsening left lower leg pain. Patient states he has a fall a couple of months ago due to gait instability caused by his chemotherapy treatment. Since then, he has been having gradually worsening pain and swelling. PCP started him on lasix for swelling and gabapentin for suspect diabetic neuropathy, though he has not had any relief of his symptoms. Pain is located to left lateral distal lower leg into the dorsal lateral foot, as well as distal foot at the base of the 2-4 digits. Pain is described as throbbing and intermittent sharp pains that seem to be worse at night. He recently saw a podiatrist who trimmed his nails and shaved down the callus on the plantar aspect of his foot (around the 3rd metatarsal).   The history is provided by the patient.       Past Medical History:  Diagnosis Date  . Arthritis    "right knee" (09/04/2015)  . Atrial flutter with rapid ventricular response ([place]) 04/19/2019  . Dizziness 10/2019  . Dyspnea    increased exertion  . HTN (hypertension)   . Hyperlipidemia   . Lung cancer ([place]) 05/2019  . NSTEMI (non-ST elevated myocardial infarction) ([place]) 09/04/2015   a. cath 09/05/2015: 95% stenosis mid-LAD, 55% mid-RCA, and 40% prox-RCA. PCI performed w/ Synergy DES to LAD  . PAD (peripheral artery disease) (HCC)    Stenting of bilateral iliacs in 2003.  [name] Refusal of blood transfusions as patient is Jehovah's Witness   . Type II diabetes mellitus [facility])     Patient Active Problem List   Diagnosis Date Noted  . Port-A-Cath in place 12/29/2019  . Metastatic non-small cell lung cancer ([place])  07/07/2019  . Encounter for antineoplastic chemotherapy 07/07/2019  . Encounter for antineoplastic immunotherapy 07/07/2019  . Goals of care, counseling/discussion 06/28/2019  . Adenocarcinoma of right lung, stage 4 ([place]) 06/28/2019  . Cancer associated pain 06/28/2019  . CAD (coronary artery disease) 06/08/2019  . Mediastinal lymphadenopathy 06/03/2019  . Liver metastasis ([place]) 06/03/2019  . Hemoglobin A1C between 7% and 9% indicating borderline diabetic control 05/10/2019  . COVID-19 virus infection 05/10/2019  . Atrial flutter with rapid ventricular response ([place]) 04/19/2019  . Chest pain with moderate risk for cardiac etiology 04/19/2019  . Paroxysmal atrial flutter ([place]) 04/19/2019  . Chest congestion 07/10/2018  . Cough 07/10/2018  . NSTEMI (non-ST elevated myocardial infarction) ([place]) 09/04/2015  . PVD (peripheral vascular disease) ([place]) 06/08/2015  . DDD (degenerative disc disease), cervical 06/08/2015  . Hyperlipidemia 06/08/2015  . Diabetes mellitus with nephropathy ([place]) 06/08/2015  . Diabetes mellitus with peripheral vascular disease ([place]) 06/08/2015  . Annual physical exam 01/03/2015  . PAD (peripheral artery disease) ([place]) 02/18/2013  . Diabetes ([place]) 02/18/2013  . HTN (hypertension) 02/18/2013    Past Surgical History:  Procedure Laterality Date  . CARDIAC CATHETERIZATION N/A 09/05/2015   Procedure: Left Heart Cath and Coronary Angiography;  Surgeon: Troy Sine, MD;  Location: [facility];  Service: Cardiovascular;  Laterality: N/A;  . CARDIAC CATHETERIZATION N/A 09/05/2015   Procedure: Coronary Stent Intervention;  Surgeon: Troy Sine, MD;  Location: [facility];  Service: Cardiovascular;  Laterality: N/A;  . IR IMAGING GUIDED PORT INSERTION  07/15/2019  . 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 PERIPHERAL VASCULAR CATHETERIZATION Bilateral 2003   "stenting"  . TONSILLECTOMY  ~ 1963       Family History  Problem  Relation Age of Onset  . Lymphoma Mother   . Cancer - Prostate Father   . CAD Neg Hx   . Colon cancer Neg Hx   . Rectal cancer Neg Hx   . Stomach cancer Neg Hx   . Esophageal cancer Neg Hx     Social History   Tobacco Use  . Smoking status: Former Smoker    Packs/day: 2.00    Years: 35.00    Pack years: 70.00    Types: Cigarettes    Quit date: 02/19/2003    Years since quitting: 17.0  . Smokeless tobacco: Never Used  Vaping Use  . Vaping Use: Never used  Substance Use Topics  . Alcohol use: Not Currently    Alcohol/week: 0.0 standard drinks  . Drug use: Not Currently    Types: Marijuana    Home Medications Prior to Admission medications   Medication Sig Start Date End Date Taking? Authorizing Provider  acetaminophen (TYLENOL) 500 MG tablet Take 500 mg by mouth every 6 (six) hours as needed for moderate pain or fever.     [provider]  aspirin EC 81 MG tablet Take 81 mg by mouth at bedtime.    [provider]  carvedilol (COREG) 12.5 MG tablet Take 1 tablet (12.5 mg total) by mouth 2 (two) times daily with a meal. 02/16/20   [name], FNP  cetirizine (ZYRTEC) 10 MG tablet Take 1 tablet (10 mg total) by mouth daily. 02/16/20   [name], FNP  clopidogrel (PLAVIX) 75 MG tablet Take 1 tablet (75 mg total) by mouth daily. 02/16/20   [name], FNP  docusate sodium (COLACE) 100 MG capsule Take 1 capsule (100 mg total) by mouth 2 (two) times daily as needed for mild constipation. 11/22/19   [name], FNP  ferrous sulfate 324 MG TBEC Take 1 tablet (324 mg total) by mouth daily with breakfast. 11/22/19   [name], FNP  folic acid (FOLVITE) 1 MG tablet Take 1 tablet (1 mg total) by mouth daily. 02/16/20   [name], FNP  furosemide (LASIX) 20 MG tablet Take 1 tablet (20 mg total) by mouth 2 (two) times daily. 02/28/20   [name], FNP  gabapentin (NEURONTIN) 300 MG capsule Take 1 capsule (300 mg total) by mouth at  bedtime. 02/28/20   [name], FNP  glimepiride (AMARYL) 4 MG tablet Take 1 tablet (4 mg total) by mouth daily. 01/09/20   [name], FNP  Hydrocortisone Acetate 1 % CREA Apply 15 g topically 2 (two) times daily. 05/10/19   [name], FNP  ipratropium (ATROVENT) 0.03 % nasal spray USE 2 SPRAYS IN EACH NOSTRIL TWICE DAILY Patient taking differently: Place 1 spray into both nostrils daily as needed (allergies).  01/13/19   [name], FNP  lactulose (CHRONULAC) 10 GM/15ML solution TAKE 30 MLS (20 G TOTAL) BY MOUTH 2 (TWO) TIMES DAILY AS NEEDED FOR MODERATE CONSTIPATION. 02/17/20   [name], FNP  lactulose (CHRONULAC) 10 GM/15ML solution Take 30 mLs (20 g total) by mouth 2 (two) times daily as needed  for moderate constipation. 02/17/20   [name], FNP  lidocaine-prilocaine (EMLA) cream APPLY TO THE PORT-A-CATH SITE 30-60 MINUTES BEFORE TREATMENT. Patient taking differently: Apply 1 application topically daily as needed (access port). Apply to the Port-A-Cath site 30-60 minutes before treatment. 07/28/19   Curt Bears, MD  lisinopril (ZESTRIL) 10 MG tablet Take 1 tablet (10 mg total) by mouth daily. 01/09/20   [name], FNP  meclizine (ANTIVERT) 25 MG tablet Take 1 tablet (25 mg total) by mouth 3 (three) times daily as needed for dizziness. Patient taking differently: Take 25 mg by mouth 3 (three) times daily.  11/26/19   [name], FNP  metFORMIN (GLUCOPHAGE) 1000 MG tablet Take 1 tablet (1,000 mg total) by mouth 2 (two) times daily with a meal. 02/16/20   [name], FNP  multivitamin (ONE-A-DAY MEN'S) TABS tablet Take 1 tablet by mouth daily. 11/11/18   [name], FNP  nitroGLYCERIN (NITROSTAT) 0.4 MG SL tablet Place 1 tablet (0.4 mg total) under the tongue every 5 (five) minutes x 3 doses as needed for chest pain. 08/26/17   Imogene Burn, PA-C  oxyCODONE-acetaminophen (PERCOCET/ROXICET) 5-325 MG tablet Take 1 tablet by mouth  every 6 (six) hours as needed for severe pain. 06/28/19   Curt Bears, MD  pantoprazole (PROTONIX) 40 MG tablet Take 1 tablet (40 mg total) by mouth daily. Patient not taking: Reported on 02/28/2020 02/16/20   [name], FNP  prochlorperazine (COMPAZINE) 10 MG tablet Take 1 tablet (10 mg total) by mouth every 6 (six) hours as needed for nausea or vomiting. 02/16/20   [name], FNP  rosuvastatin (CRESTOR) 40 MG tablet Take 1 tablet (40 mg total) by mouth daily. 02/16/20   [name], FNP  sucralfate (CARAFATE) 1 GM/10ML suspension Take 10 mLs (1 g total) by mouth 4 (four) times daily. 05/13/19   Armbruster, Carlota Raspberry, MD    Allergies    Patient has no known allergies.  Review of Systems   Review of Systems  Cardiovascular: Positive for leg swelling.  Musculoskeletal: Positive for myalgias.  Neurological: Positive for numbness.  All other systems reviewed and are negative.   Physical Exam Updated Vital Signs BP (!) 141/73 (BP Location: Left Arm)   Pulse (!) 101   Temp 98.1 F (36.7 C) (Oral)   Resp 16   Ht 6\' 3"  (1.905 m)   Wt 99.9 kg   SpO2 97%   BMI 27.54 kg/m   Physical Exam Vitals and nursing note reviewed.  Constitutional:      General: He is not in acute distress.    Appearance: He is well-developed.  HENT:     Head: Normocephalic and atraumatic.  Eyes:     Conjunctiva/sclera: Conjunctivae normal.  Cardiovascular:     Rate and Rhythm: Normal rate.  Pulmonary:     Effort: Pulmonary effort is normal.  Abdominal:     Palpations: Abdomen is soft.  Musculoskeletal:     Comments: Left lower leg with trace edema present both pretibial, and surrounding the ankle and foot.  No erythema or warmth.  There is some tenderness to the lateral lower leg extending to the lateral and dorsal foot.  Intact DP pulse. Nl ROM. No wounds.  Skin:    General: Skin is warm.  Neurological:     Mental Status: He is alert.  Psychiatric:        Behavior: Behavior  normal.     ED Results / Procedures /  Treatments   Labs (all labs ordered are listed, but only abnormal results are displayed) Labs Reviewed  CBG MONITORING, ED - Abnormal; Notable for the following components:      Result Value   Glucose-Capillary 118 (*)    All other components within normal limits    EKG None  Radiology DG Foot Complete Left  Result Date: 03/20/2020 CLINICAL DATA:  Pain EXAM: LEFT FOOT - COMPLETE 3+ VIEW COMPARISON:  None. FINDINGS: There is no evidence of fracture or dislocation. There is no evidence of arthropathy or other focal bone abnormality. Soft tissues are unremarkable. IMPRESSION: Negative. Electronically Signed   By: Constance Holster M.D.   On: 03/20/2020 16:55   VAS [place] LOWER EXTREMITY VENOUS (DVT) (ONLY [facility] & [facility] 7a-7p)  Result Date: 03/20/2020  Lower Venous DVTStudy Indications: Swelling of foot/ankle.  Comparison Study: No prior studies. Performing Technologist: Darlin Coco  Examination Guidelines: A complete evaluation includes B-mode imaging, spectral Doppler, color Doppler, and power Doppler as needed of all accessible portions of each vessel. Bilateral testing is considered an integral part of a complete examination. Limited examinations for reoccurring indications may be performed as noted. The reflux portion of the exam is performed with the patient in reverse Trendelenburg.  +-----+---------------+---------+-----------+----------+--------------+ RIGHTCompressibilityPhasicitySpontaneityPropertiesThrombus Aging +-----+---------------+---------+-----------+----------+--------------+ CFV  Full           Yes      Yes                                 +-----+---------------+---------+-----------+----------+--------------+   +---------+---------------+---------+-----------+----------+--------------+ LEFT     CompressibilityPhasicitySpontaneityPropertiesThrombus Aging  +---------+---------------+---------+-----------+----------+--------------+ CFV      Full           Yes      Yes                                 +---------+---------------+---------+-----------+----------+--------------+ SFJ      Full                                                        +---------+---------------+---------+-----------+----------+--------------+ FV Prox  Full                                                        +---------+---------------+---------+-----------+----------+--------------+ FV Mid   Full                                                        +---------+---------------+---------+-----------+----------+--------------+ FV DistalFull                                                        +---------+---------------+---------+-----------+----------+--------------+ PFV      Full                                                        +---------+---------------+---------+-----------+----------+--------------+  POP      Full           Yes      Yes                                 +---------+---------------+---------+-----------+----------+--------------+ PTV      Full                                                        +---------+---------------+---------+-----------+----------+--------------+ PERO     Full                                                        +---------+---------------+---------+-----------+----------+--------------+     Summary: RIGHT: - No evidence of common femoral vein obstruction.  LEFT: - There is no evidence of deep vein thrombosis in the lower extremity.  - No cystic structure found in the popliteal fossa. -Incidental: Monophasic arterial waveforms noted throughout left lower extremity.  *See table(s) above for measurements and observations.    Preliminary     Procedures Procedures (including critical care time)  Medications Ordered in ED Medications - No data to display  ED Course  I have  reviewed the triage vital signs and the nursing notes.  Pertinent labs & imaging results that were available during my care of the patient were reviewed by me and considered in my medical decision making (see chart for details).    MDM Rules/Calculators/A&P                          Patient with 1 month of left lower leg and foot pain with some mild swelling present.  He fell a couple of months ago though no significant injury was noted.  He does have diabetic neuropathy endorses some numbness as well as sharp and throbbing pain.  Symptoms seem to be worse at night.  No history of DVT though patient is currently going active chemotherapy for lung cancer.  On exam he is well-appearing and in no distress.  Mild edema is noted to the lower leg and foot, no redness or warmth.  No wounds.  Normal pulse.  Plain film as well as venous ultrasound were obtained for further evaluation and are negative.  Exam is not consistent with cellulitis.  Low suspicion for osteomyelitis.  Consider venous insufficiency as cause of patient's leg edema.  He is instructed to elevate his leg is much as possible, compression stockings, and close follow-up by PCP.  Strict return precautions discussed.   Final Clinical Impression(s) / ED Diagnoses Final diagnoses:  Pain of left lower leg    Rx / DC Orders ED Discharge Orders    None       Nahomy Limburg, [place] N, PA-C 03/20/20 1735    Margette Fast, MD 03/22/20 [phone]

## 2020-03-21 ENCOUNTER — Inpatient Hospital Stay: Payer: Medicare Other

## 2020-03-21 ENCOUNTER — Other Ambulatory Visit: Payer: Self-pay

## 2020-03-21 ENCOUNTER — Encounter: Payer: Self-pay | Admitting: Internal Medicine

## 2020-03-21 ENCOUNTER — Telehealth: Payer: Self-pay

## 2020-03-21 ENCOUNTER — Inpatient Hospital Stay (HOSPITAL_BASED_OUTPATIENT_CLINIC_OR_DEPARTMENT_OTHER): Payer: Medicare Other | Admitting: Internal Medicine

## 2020-03-21 VITALS — BP 154/58 | HR 99 | Temp 97.7°F | Resp 17 | Ht 75.0 in | Wt 222.5 lb

## 2020-03-21 DIAGNOSIS — I2510 Atherosclerotic heart disease of native coronary artery without angina pectoris: Secondary | ICD-10-CM | POA: Diagnosis not present

## 2020-03-21 DIAGNOSIS — C3491 Malignant neoplasm of unspecified part of right bronchus or lung: Secondary | ICD-10-CM

## 2020-03-21 DIAGNOSIS — C787 Secondary malignant neoplasm of liver and intrahepatic bile duct: Secondary | ICD-10-CM | POA: Diagnosis not present

## 2020-03-21 DIAGNOSIS — Z5112 Encounter for antineoplastic immunotherapy: Secondary | ICD-10-CM | POA: Diagnosis not present

## 2020-03-21 DIAGNOSIS — Z23 Encounter for immunization: Secondary | ICD-10-CM

## 2020-03-21 DIAGNOSIS — C3490 Malignant neoplasm of unspecified part of unspecified bronchus or lung: Secondary | ICD-10-CM

## 2020-03-21 DIAGNOSIS — R5383 Other fatigue: Secondary | ICD-10-CM

## 2020-03-21 DIAGNOSIS — Z95828 Presence of other vascular implants and grafts: Secondary | ICD-10-CM

## 2020-03-21 LAB — CBC WITH DIFFERENTIAL (CANCER CENTER ONLY)
Abs Immature Granulocytes: 0.03 10*3/uL (ref 0.00–0.07)
Basophils Absolute: 0.1 10*3/uL (ref 0.0–0.1)
Basophils Relative: 1 %
Eosinophils Absolute: 0.2 10*3/uL (ref 0.0–0.5)
Eosinophils Relative: 2 %
HCT: 28.8 % — ABNORMAL LOW (ref 39.0–52.0)
Hemoglobin: 9.2 g/dL — ABNORMAL LOW (ref 13.0–17.0)
Immature Granulocytes: 0 %
Lymphocytes Relative: 35 %
Lymphs Abs: 2.8 10*3/uL (ref 0.7–4.0)
MCH: 31.3 pg (ref 26.0–34.0)
MCHC: 31.9 g/dL (ref 30.0–36.0)
MCV: 98 fL (ref 80.0–100.0)
Monocytes Absolute: 1.2 10*3/uL — ABNORMAL HIGH (ref 0.1–1.0)
Monocytes Relative: 16 %
Neutro Abs: 3.7 10*3/uL (ref 1.7–7.7)
Neutrophils Relative %: 46 %
Platelet Count: 245 10*3/uL (ref 150–400)
RBC: 2.94 MIL/uL — ABNORMAL LOW (ref 4.22–5.81)
RDW: 14.4 % (ref 11.5–15.5)
WBC Count: 7.9 10*3/uL (ref 4.0–10.5)
nRBC: 0 % (ref 0.0–0.2)

## 2020-03-21 LAB — CMP (CANCER CENTER ONLY)
ALT: 16 U/L (ref 0–44)
AST: 22 U/L (ref 15–41)
Albumin: 3.8 g/dL (ref 3.5–5.0)
Alkaline Phosphatase: 63 U/L (ref 38–126)
Anion gap: 15 (ref 5–15)
BUN: 40 mg/dL — ABNORMAL HIGH (ref 8–23)
CO2: 21 mmol/L — ABNORMAL LOW (ref 22–32)
Calcium: 9.9 mg/dL (ref 8.9–10.3)
Chloride: 103 mmol/L (ref 98–111)
Creatinine: 2.28 mg/dL — ABNORMAL HIGH (ref 0.61–1.24)
GFR, Estimated: 31 mL/min — ABNORMAL LOW (ref >60)
Glucose, Bld: 195 mg/dL — ABNORMAL HIGH (ref 70–99)
Potassium: 4 mmol/L (ref 3.5–5.1)
Sodium: 139 mmol/L (ref 135–145)
Total Bilirubin: 0.3 mg/dL (ref 0.3–1.2)
Total Protein: 8.5 g/dL — ABNORMAL HIGH (ref 6.5–8.1)

## 2020-03-21 LAB — TSH: TSH: 0.793 u[IU]/mL (ref 0.320–4.118)

## 2020-03-21 MED ORDER — SODIUM CHLORIDE 0.9% FLUSH
10.0000 mL | INTRAVENOUS | Status: DC | PRN
  Administered 2020-03-21: 10 mL
  Filled 2020-03-21: qty 10

## 2020-03-21 MED ORDER — HEPARIN SOD (PORK) LOCK FLUSH 100 UNIT/ML IV SOLN
500.0000 [IU] | Freq: Once | INTRAVENOUS | Status: AC | PRN
  Administered 2020-03-21: 500 [IU]
  Filled 2020-03-21: qty 5

## 2020-03-21 MED ORDER — SODIUM CHLORIDE 0.9 % IV SOLN
Freq: Once | INTRAVENOUS | Status: AC
  Filled 2020-03-21: qty 250

## 2020-03-21 MED ORDER — INFLUENZA VAC A&B SA ADJ QUAD 0.5 ML IM PRSY
0.5000 mL | PREFILLED_SYRINGE | Freq: Once | INTRAMUSCULAR | Status: AC
  Administered 2020-03-21: 0.5 mL via INTRAMUSCULAR

## 2020-03-21 MED ORDER — SODIUM CHLORIDE 0.9% FLUSH
10.0000 mL | Freq: Once | INTRAVENOUS | Status: AC
  Administered 2020-03-21: 10 mL
  Filled 2020-03-21: qty 10

## 2020-03-21 MED ORDER — INFLUENZA VAC A&B SA ADJ QUAD 0.5 ML IM PRSY
PREFILLED_SYRINGE | INTRAMUSCULAR | Status: AC
  Filled 2020-03-21: qty 0.5

## 2020-03-21 MED ORDER — PROCHLORPERAZINE MALEATE 10 MG PO TABS: 10.0000 mg | ORAL_TABLET | Freq: Once | ORAL | Status: DC

## 2020-03-21 MED ORDER — SODIUM CHLORIDE 0.9 % IV SOLN
200.0000 mg | Freq: Once | INTRAVENOUS | Status: AC
  Administered 2020-03-21: 200 mg via INTRAVENOUS
  Filled 2020-03-21: qty 8

## 2020-03-21 NOTE — Telephone Encounter (Addendum)
Per Dr. [name] OK to treat with creatinine 2.28. Pt will only get Keytruda today

## 2020-03-21 NOTE — Patient Instructions (Signed)
[facility] Discharge Instructions for Patients Receiving Chemotherapy  Today you received the following chemotherapy agents: Beryle Flock  To help prevent nausea and vomiting after your treatment, we encourage you to take your nausea medication as directed.    If you develop nausea and vomiting that is not controlled by your nausea medication, call the clinic.   BELOW ARE SYMPTOMS THAT SHOULD BE REPORTED IMMEDIATELY:  *FEVER GREATER THAN 100.5 F  *CHILLS WITH OR WITHOUT FEVER  NAUSEA AND VOMITING THAT IS NOT CONTROLLED WITH YOUR NAUSEA MEDICATION  *UNUSUAL SHORTNESS OF BREATH  *UNUSUAL BRUISING OR BLEEDING  TENDERNESS IN MOUTH AND THROAT WITH OR WITHOUT PRESENCE OF ULCERS  *URINARY PROBLEMS  *BOWEL PROBLEMS  UNUSUAL RASH Items with * indicate a potential emergency and should be followed up as soon as possible.  Feel free to call the clinic should you have any questions or concerns. The clinic phone number is [phone].  Please show the [facility] at check-in to the Emergency Department and triage nurse.

## 2020-03-21 NOTE — Progress Notes (Signed)
Per Dr. [name], patient only receiving Beryle Flock today due to creatine 2.28.

## 2020-03-21 NOTE — Patient Instructions (Signed)

## 2020-03-21 NOTE — Progress Notes (Signed)
Baseline Scr 1. Scr 2.28 today. Continue with Keytruda only today per MD. No IVF needed. Pt was on Lasix per PCP.  Hardie Pulley, PharmD, BCPS, BCOP

## 2020-03-21 NOTE — Progress Notes (Signed)
[place] [phone]) [phone]   [phone]  OFFICE PROGRESS NOTE  [name], FNP [name] [address]  DIAGNOSIS: Stage IV (TX, N3, M1c) non-small cell lung cancer, poorly differentiated adenocarcinoma presented with bulky mediastinal lymphadenopathy in addition to right axillary lymphadenopathy and metastatic liver lesions, abdominal lymphadenopathy diagnosed in January 2021. Molecular studies by [employer] [employer] that shows no actionable mutations.  PRIOR THERAPY: None  CURRENT THERAPY: Systemic chemotherapy with carboplatin for AUC of 5, Alimta 500 mg/M2 and Keytruda 200 mg IV every 3 weeks.  First dose July 14, 2019.  Status post 12 cycles. Starting from cycle #5 the patient will be treated with maintenance Alimta and Keytruda every 3 weeks.  Starting from cycle #12 he will be on single agent treatment with Keytruda secondary to renal insufficiency.  INTERVAL HISTORY: Kirk [name] 64 y.o. male returns to the clinic today for follow-up visit.  The patient is feeling fine today with no concerning complaints except for the swelling of the lower extremities.  He was seen by his primary care physician and started on Lasix.  Unfortunately this worsened his renal insufficiency.  He denied having any current chest pain, shortness of breath except with exertion with no cough or hemoptysis.  He has no nausea, vomiting, diarrhea or constipation.  He has no headache or visual changes.  He is tolerating his treatment fairly well.  He received single agent Keytruda last cycle because of the renal insufficiency.  The patient had repeat CT scan of the Chest, Abdomen and pelvis as well as CT scan of the head performed recently and he is here for evaluation and discussion of his scan results.  MEDICAL HISTORY: Past Medical History:  Diagnosis Date  . Arthritis    "right knee" (09/04/2015)  . Atrial flutter with rapid ventricular response ([facility])  04/19/2019  . Dizziness 10/2019  . Dyspnea    increased exertion  . HTN (hypertension)   . Hyperlipidemia   . Lung cancer ([facility]) 05/2019  . NSTEMI (non-ST elevated myocardial infarction) ([facility]) 09/04/2015   a. cath 09/05/2015: 95% stenosis mid-LAD, 55% mid-RCA, and 40% prox-RCA. PCI performed w/ Synergy DES to LAD  . PAD (peripheral artery disease) ([facility])    Stenting of bilateral iliacs in 2003.  [name] Refusal of blood transfusions as patient is Jehovah's Witness   . Type II diabetes mellitus ([facility])     ALLERGIES:  has No Known Allergies.  MEDICATIONS:  Current Outpatient Medications  Medication Sig Dispense Refill  . acetaminophen (TYLENOL) 500 MG tablet Take 500 mg by mouth every 6 (six) hours as needed for moderate pain or fever.     [name] aspirin EC 81 MG tablet Take 81 mg by mouth at bedtime.    . carvedilol (COREG) 12.5 MG tablet Take 1 tablet (12.5 mg total) by mouth 2 (two) times daily with a meal. 180 tablet 3  . cetirizine (ZYRTEC) 10 MG tablet Take 1 tablet (10 mg total) by mouth daily. 90 tablet 3  . clopidogrel (PLAVIX) 75 MG tablet Take 1 tablet (75 mg total) by mouth daily. 90 tablet 3  . docusate sodium (COLACE) 100 MG capsule Take 1 capsule (100 mg total) by mouth 2 (two) times daily as needed for mild constipation. 60 capsule 11  . ferrous sulfate 324 MG TBEC Take 1 tablet (324 mg total) by mouth daily with breakfast. 30 tablet 11  . folic acid (FOLVITE) 1 MG tablet Take 1  tablet (1 mg total) by mouth daily. 90 tablet 3  . furosemide (LASIX) 20 MG tablet Take 1 tablet (20 mg total) by mouth 2 (two) times daily. 60 tablet 1  . gabapentin (NEURONTIN) 300 MG capsule Take 1 capsule (300 mg total) by mouth at bedtime. 30 capsule 1  . glimepiride (AMARYL) 4 MG tablet Take 1 tablet (4 mg total) by mouth daily. 90 tablet 3  . Hydrocortisone Acetate 1 % CREA Apply 15 g topically 2 (two) times daily. 15 g 3  . ipratropium (ATROVENT) 0.03 % nasal spray USE 2 SPRAYS IN EACH NOSTRIL TWICE  DAILY (Patient taking differently: Place 1 spray into both nostrils daily as needed (allergies). ) 30 mL 2  . lactulose (CHRONULAC) 10 GM/15ML solution TAKE 30 MLS (20 G TOTAL) BY MOUTH 2 (TWO) TIMES DAILY AS NEEDED FOR MODERATE CONSTIPATION. 720 mL 1  . lactulose (CHRONULAC) 10 GM/15ML solution Take 30 mLs (20 g total) by mouth 2 (two) times daily as needed for moderate constipation. 240 mL 11  . lidocaine-prilocaine (EMLA) cream APPLY TO THE PORT-A-CATH SITE 30-60 MINUTES BEFORE TREATMENT. (Patient taking differently: Apply 1 application topically daily as needed (access port). Apply to the Port-A-Cath site 30-60 minutes before treatment.) 30 g 0  . lisinopril (ZESTRIL) 10 MG tablet Take 1 tablet (10 mg total) by mouth daily. 90 tablet 3  . meclizine (ANTIVERT) 25 MG tablet Take 1 tablet (25 mg total) by mouth 3 (three) times daily as needed for dizziness. (Patient taking differently: Take 25 mg by mouth 3 (three) times daily. ) 90 tablet 3  . metFORMIN (GLUCOPHAGE) 1000 MG tablet Take 1 tablet (1,000 mg total) by mouth 2 (two) times daily with a meal. 180 tablet 3  . multivitamin (ONE-A-DAY MEN'S) TABS tablet Take 1 tablet by mouth daily. 30 tablet 6  . nitroGLYCERIN (NITROSTAT) 0.4 MG SL tablet Place 1 tablet (0.4 mg total) under the tongue every 5 (five) minutes x 3 doses as needed for chest pain. 25 tablet 2  . oxyCODONE-acetaminophen (PERCOCET/ROXICET) 5-325 MG tablet Take 1 tablet by mouth every 6 (six) hours as needed for severe pain. 30 tablet 0  . prochlorperazine (COMPAZINE) 10 MG tablet Take 1 tablet (10 mg total) by mouth every 6 (six) hours as needed for nausea or vomiting. 30 tablet 11  . rosuvastatin (CRESTOR) 40 MG tablet Take 1 tablet (40 mg total) by mouth daily. 90 tablet 3  . sucralfate (CARAFATE) 1 GM/10ML suspension Take 10 mLs (1 g total) by mouth 4 (four) times daily. 420 mL 1  . pantoprazole (PROTONIX) 40 MG tablet Take 1 tablet (40 mg total) by mouth daily. (Patient not  taking: Reported on 02/28/2020) 90 tablet 3   No current facility-administered medications for this visit.    SURGICAL HISTORY:  Past Surgical History:  Procedure Laterality Date  . CARDIAC CATHETERIZATION N/A 09/05/2015   Procedure: Left Heart Cath and Coronary Angiography;  Surgeon: Troy Sine, MD;  Location: [facility];  Service: Cardiovascular;  Laterality: N/A;  . CARDIAC CATHETERIZATION N/A 09/05/2015   Procedure: Coronary Stent Intervention;  Surgeon: Troy Sine, MD;  Location: [facility];  Service: Cardiovascular;  Laterality: N/A;  . IR IMAGING GUIDED PORT INSERTION  07/15/2019  . 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 PERIPHERAL VASCULAR CATHETERIZATION Bilateral 2003   "stenting"  . TONSILLECTOMY  ~ 1963    REVIEW OF SYSTEMS:  Constitutional: positive for  fatigue Eyes: negative Ears, nose, mouth, throat, and face: negative Respiratory: negative Cardiovascular: negative Gastrointestinal: negative Genitourinary:negative Integument/breast: negative Hematologic/lymphatic: negative Musculoskeletal:negative Neurological: negative Behavioral/Psych: negative Endocrine: negative Allergic/Immunologic: negative   PHYSICAL EXAMINATION: General appearance: alert, cooperative, appears stated age, fatigued and no distress Head: Normocephalic, without obvious abnormality, atraumatic Neck: no adenopathy, no JVD, supple, symmetrical, trachea midline and thyroid not enlarged, symmetric, no tenderness/mass/nodules Lymph nodes: Cervical, supraclavicular, and axillary nodes normal. Resp: clear to auscultation bilaterally Back: symmetric, no curvature. ROM normal. No CVA tenderness. Cardio: regular rate and rhythm, S1, S2 normal, no murmur, click, rub or gallop GI: soft, non-tender; bowel sounds normal; no masses,  no organomegaly Extremities: edema 1+ edema bilaterally Neurologic: Alert and oriented X 3, normal strength and tone. Normal  symmetric reflexes. Normal coordination and gait  ECOG PERFORMANCE STATUS: 1 - Symptomatic but completely ambulatory  Blood pressure (!) 154/58, pulse 99, temperature 97.7 F (36.5 C), temperature source Tympanic, resp. rate 17, height 6\' 3"  (1.905 m), weight 222 lb 8 oz (100.9 kg), SpO2 100 %.  LABORATORY DATA: Lab Results  Component Value Date   WBC 7.9 03/21/2020   HGB 9.2 (L) 03/21/2020   HCT 28.8 (L) 03/21/2020   MCV 98.0 03/21/2020   PLT 245 03/21/2020      Chemistry      Component Value Date/Time   NA 139 03/21/2020 1046   NA 140 06/08/2015 0840   K 4.0 03/21/2020 1046   CL 103 03/21/2020 1046   CO2 21 (L) 03/21/2020 1046   BUN 40 (H) 03/21/2020 1046   BUN 15 06/08/2015 0840   CREATININE 2.28 (H) 03/21/2020 1046   CREATININE 1.06 10/01/2016 0855      Component Value Date/Time   CALCIUM 9.9 03/21/2020 1046   ALKPHOS 63 03/21/2020 1046   AST 22 03/21/2020 1046   ALT 16 03/21/2020 1046   BILITOT 0.3 03/21/2020 1046       RADIOGRAPHIC STUDIES: CT Abdomen Pelvis Wo Contrast  Result Date: 03/16/2020 CLINICAL DATA:  Lung cancer diagnosed in February 2021. Chemotherapy in progress. EXAM: CT CHEST, ABDOMEN AND PELVIS WITHOUT CONTRAST TECHNIQUE: Multidetector CT imaging of the chest, abdomen and pelvis was performed following the standard protocol without IV contrast. COMPARISON:  01/17/2020 FINDINGS: CT CHEST FINDINGS Cardiovascular: Right Port-A-Cath tip: SVC. Coronary, aortic arch, and branch vessel atherosclerotic vascular disease. Stable small pericardial effusion. Mediastinum/Nodes: Subcarinal node 1.7 cm in short axis on image 31 series 7, previously 1.8 cm. Lower thoracic periaortic lymph node 1.0 cm in short axis on image 47 of series 7, previously the same. Right lower paratracheal lymph node 1.0 cm in short axis on image 22 of series 7, previously the same. Lungs/Pleura: Centrilobular emphysema. Stable scarring in the right middle lobe. Mild scarring in the right  lower lobe. Increase conspicuity of mild nodularity along the right hemidiaphragmatic pleural surface measuring 0.4 cm in diameter on image 126 of series 9. Musculoskeletal: Lower thoracic spondylosis. CT ABDOMEN PELVIS FINDINGS Hepatobiliary: Hypodensity corresponding to known hemangioma in segment 5 of the liver. There are previously some subtle hypodense lesions in the liver on postcontrast images, these are not visible on today's noncontrast MRI, possibly indicating resolution but more likely simply not conspicuous without contrast. The gallbladder appears unremarkable. Pancreas: Unremarkable Spleen: Unremarkable Adrenals/Urinary Tract: Unremarkable Stomach/Bowel: Prominent stool throughout the colon favors constipation. Vascular/Lymphatic: Aortoiliac atherosclerotic vascular disease. Left external iliac node 0.8 cm in short axis on image 113 of series 7, previously 0.9 cm. Scattered small central mesenteric lymph nodes.  Right gastric lymph node 1.1 cm in short axis on image 57 of series 7, previously 1.0 cm. A separate right gastric/gastrohepatic ligament node measures 1.6 cm in short axis on image 58 of series 7, formerly 1.7 cm. Other regional mildly enlarged right gastric/gastrohepatic ligament lymph nodes are present. These are roughly stable from prior. Reproductive: Unremarkable Other: Hazy stranding in the central mesentery unchanged from prior. Musculoskeletal: Left proximal femoral fixation screws unchanged. Lower lumbar spondylosis and degenerative disc disease with potential foraminal impingement at L2-3, L3-4, and L4-5. Moderate to severe degenerative right hip arthropathy. IMPRESSION: 1. Essentially stable appearance of mild adenopathy in the chest and upper abdomen. 2. 4 mm pleural-based nodule along the right hemidiaphragm, merits surveillance. 3. Stable nonspecific hazy stranding in the central mesentery, nonspecific but possibly from low-grade sclerosing mesenteritis. 4. Other imaging findings  of potential clinical significance: Coronary atherosclerosis. Stable small pericardial effusion. Prominent stool throughout the colon favors constipation. Lower lumbar spondylosis and degenerative disc disease with potential foraminal impingement at L2-3, L3-4, and L4-5. Moderate to severe degenerative right hip arthropathy. 5. Emphysema and aortic atherosclerosis. Aortic Atherosclerosis (ICD10-I70.0) and Emphysema (ICD10-J43.9). Electronically Signed   By: Van Clines M.D.   On: 03/16/2020 15:17   CT Head Wo Contrast  Result Date: 03/16/2020 CLINICAL DATA:  Metastatic disease evaluation. Stage IV lung cancer. Dizziness and right parietal scalp lesion. Assess for metastatic disease. EXAM: CT HEAD WITHOUT CONTRAST TECHNIQUE: Contiguous axial images were obtained from the base of the skull through the vertex without intravenous contrast. COMPARISON:  MRI of the brain and head CT, December 25, 2019. FINDINGS: Brain: No evidence of acute infarction, hemorrhage, hydrocephalus, extra-axial collection or mass lesion/mass effect. Vascular: Calcified plaques in the bilateral carotid siphons. Skull: Stable appearance of hypodense 6 mm left parietal bone lesion. No new lesions identified. Sinuses/Orbits: No acute finding. Other: Interval growth of right parietal scalp lesion now measuring 1.4 cm (1.1 cm on prior). This has nonspecific appearance and may represent an epidermal inclusion cyst. However, the possibility metastatic lesion to the skin cannot be entirely excluded. IMPRESSION: 1. No acute intracranial abnormality. Please, note that noncontrast head CT has low sensitivity for small metastatic lesions. 2. Interval growth of right parietal scalp lesion now measuring 1.4 cm (1.1 cm on prior). This has a nonspecific appearance and may represent an epidermal inclusion cyst. However, the possibility metastatic lesion to the skin cannot be entirely excluded. Electronically Signed   By: Pedro Earls  M.D.   On: 03/16/2020 09:32   CT Chest Wo Contrast  Result Date: 03/16/2020 CLINICAL DATA:  Lung cancer diagnosed in February 2021. Chemotherapy in progress. EXAM: CT CHEST, ABDOMEN AND PELVIS WITHOUT CONTRAST TECHNIQUE: Multidetector CT imaging of the chest, abdomen and pelvis was performed following the standard protocol without IV contrast. COMPARISON:  01/17/2020 FINDINGS: CT CHEST FINDINGS Cardiovascular: Right Port-A-Cath tip: SVC. Coronary, aortic arch, and branch vessel atherosclerotic vascular disease. Stable small pericardial effusion. Mediastinum/Nodes: Subcarinal node 1.7 cm in short axis on image 31 series 7, previously 1.8 cm. Lower thoracic periaortic lymph node 1.0 cm in short axis on image 47 of series 7, previously the same. Right lower paratracheal lymph node 1.0 cm in short axis on image 22 of series 7, previously the same. Lungs/Pleura: Centrilobular emphysema. Stable scarring in the right middle lobe. Mild scarring in the right lower lobe. Increase conspicuity of mild nodularity along the right hemidiaphragmatic pleural surface measuring 0.4 cm in diameter on image 126 of series 9. Musculoskeletal: Lower  thoracic spondylosis. CT ABDOMEN PELVIS FINDINGS Hepatobiliary: Hypodensity corresponding to known hemangioma in segment 5 of the liver. There are previously some subtle hypodense lesions in the liver on postcontrast images, these are not visible on today's noncontrast MRI, possibly indicating resolution but more likely simply not conspicuous without contrast. The gallbladder appears unremarkable. Pancreas: Unremarkable Spleen: Unremarkable Adrenals/Urinary Tract: Unremarkable Stomach/Bowel: Prominent stool throughout the colon favors constipation. Vascular/Lymphatic: Aortoiliac atherosclerotic vascular disease. Left external iliac node 0.8 cm in short axis on image 113 of series 7, previously 0.9 cm. Scattered small central mesenteric lymph nodes. Right gastric lymph node 1.1 cm in short  axis on image 57 of series 7, previously 1.0 cm. A separate right gastric/gastrohepatic ligament node measures 1.6 cm in short axis on image 58 of series 7, formerly 1.7 cm. Other regional mildly enlarged right gastric/gastrohepatic ligament lymph nodes are present. These are roughly stable from prior. Reproductive: Unremarkable Other: Hazy stranding in the central mesentery unchanged from prior. Musculoskeletal: Left proximal femoral fixation screws unchanged. Lower lumbar spondylosis and degenerative disc disease with potential foraminal impingement at L2-3, L3-4, and L4-5. Moderate to severe degenerative right hip arthropathy. IMPRESSION: 1. Essentially stable appearance of mild adenopathy in the chest and upper abdomen. 2. 4 mm pleural-based nodule along the right hemidiaphragm, merits surveillance. 3. Stable nonspecific hazy stranding in the central mesentery, nonspecific but possibly from low-grade sclerosing mesenteritis. 4. Other imaging findings of potential clinical significance: Coronary atherosclerosis. Stable small pericardial effusion. Prominent stool throughout the colon favors constipation. Lower lumbar spondylosis and degenerative disc disease with potential foraminal impingement at L2-3, L3-4, and L4-5. Moderate to severe degenerative right hip arthropathy. 5. Emphysema and aortic atherosclerosis. Aortic Atherosclerosis (ICD10-I70.0) and Emphysema (ICD10-J43.9). Electronically Signed   By: Van Clines M.D.   On: 03/16/2020 15:17   DG Foot Complete Left  Result Date: 03/20/2020 CLINICAL DATA:  Pain EXAM: LEFT FOOT - COMPLETE 3+ VIEW COMPARISON:  None. FINDINGS: There is no evidence of fracture or dislocation. There is no evidence of arthropathy or other focal bone abnormality. Soft tissues are unremarkable. IMPRESSION: Negative. Electronically Signed   By: Constance Holster M.D.   On: 03/20/2020 16:55   VAS [place] LOWER EXTREMITY VENOUS (DVT) (ONLY [facility] & [facility] 7a-7p)  Result Date:  03/20/2020  Lower Venous DVTStudy Indications: Swelling of foot/ankle.  Comparison Study: No prior studies. Performing Technologist: Darlin Coco  Examination Guidelines: A complete evaluation includes B-mode imaging, spectral Doppler, color Doppler, and power Doppler as needed of all accessible portions of each vessel. Bilateral testing is considered an integral part of a complete examination. Limited examinations for reoccurring indications may be performed as noted. The reflux portion of the exam is performed with the patient in reverse Trendelenburg.  +-----+---------------+---------+-----------+----------+--------------+ RIGHTCompressibilityPhasicitySpontaneityPropertiesThrombus Aging +-----+---------------+---------+-----------+----------+--------------+ CFV  Full           Yes      Yes                                 +-----+---------------+---------+-----------+----------+--------------+   +---------+---------------+---------+-----------+----------+--------------+ LEFT     CompressibilityPhasicitySpontaneityPropertiesThrombus Aging +---------+---------------+---------+-----------+----------+--------------+ CFV      Full           Yes      Yes                                 +---------+---------------+---------+-----------+----------+--------------+ SFJ      Full                                                        +---------+---------------+---------+-----------+----------+--------------+  FV Prox  Full                                                        +---------+---------------+---------+-----------+----------+--------------+ FV Mid   Full                                                        +---------+---------------+---------+-----------+----------+--------------+ FV DistalFull                                                        +---------+---------------+---------+-----------+----------+--------------+ PFV      Full                                                         +---------+---------------+---------+-----------+----------+--------------+ POP      Full           Yes      Yes                                 +---------+---------------+---------+-----------+----------+--------------+ PTV      Full                                                        +---------+---------------+---------+-----------+----------+--------------+ PERO     Full                                                        +---------+---------------+---------+-----------+----------+--------------+     Summary: RIGHT: - No evidence of common femoral vein obstruction.  LEFT: - There is no evidence of deep vein thrombosis in the lower extremity.  - No cystic structure found in the popliteal fossa. -Incidental: Monophasic arterial waveforms noted throughout left lower extremity.  *See table(s) above for measurements and observations. Electronically signed by Ruta Hinds MD on 03/20/2020 at 6:20:37 PM.    Final     ASSESSMENT AND PLAN: This is a very pleasant 64 years old African-American male recently diagnosed with a stage IV (TX, N3, M1c) non-small cell lung cancer, poorly differentiated adenocarcinoma presented with bulky mediastinal as well as right hilar, supraclavicular, abdominal and right axillary lymphadenopathy in addition to metastatic liver lesions diagnosed in January 2021. Molecular studies by [employer]ant 360 shows no actionable mutations.  The patient is not a candidate for treatment with targeted therapy or enrollment in the clinical trial with the [facility] regimen. The patient is currently undergoing systemic chemotherapy with carboplatin for AUC of 5, Alimta 500  mg/M2 and Keytruda 200 mg IV every 3 weeks status post 12 cycles. Starting from cycle #5 he is treated with maintenance Alimta and Keytruda every 3 weeks.  Starting from cycle #12 the patient has been on treatment with single agent Keytruda because of the renal  insufficiency. He tolerated the last cycle of his treatment fairly well. He had repeat CT scan of the head, chest, abdomen pelvis performed recently.  I personally and independently reviewed the scans and discussed the results with the patient today. His scan showed no concerning findings for disease progression. I recommended for him to continue his current treatment and he will proceed with cycle #16 with single agent Keytruda because of the renal insufficiency. I will see him back for follow-up visit in 3 weeks for evaluation before the next cycle of his treatment. Because of the renal insufficiency, I recommended for the patient to discontinue his current treatment with Lasix. He was advised to call immediately if he has any concerning symptoms in the interval. The patient voices understanding of current disease status and treatment options and is in agreement with the current care plan.  All questions were answered. The patient knows to call the clinic with any problems, questions or concerns. We can certainly see the patient much sooner if necessary.  Disclaimer: This note was dictated with voice recognition software. Similar sounding words can inadvertently be transcribed and may not be corrected upon review.

## 2020-04-05 ENCOUNTER — Encounter: Payer: Self-pay | Admitting: Family Medicine

## 2020-04-05 ENCOUNTER — Other Ambulatory Visit: Payer: Self-pay

## 2020-04-05 ENCOUNTER — Ambulatory Visit (INDEPENDENT_AMBULATORY_CARE_PROVIDER_SITE_OTHER): Payer: Medicare Other | Admitting: Family Medicine

## 2020-04-05 VITALS — BP 116/53 | HR 89 | Temp 97.7°F | Ht 75.0 in | Wt 226.0 lb

## 2020-04-05 DIAGNOSIS — R600 Localized edema: Secondary | ICD-10-CM

## 2020-04-05 DIAGNOSIS — R42 Dizziness and giddiness: Secondary | ICD-10-CM

## 2020-04-05 DIAGNOSIS — C3490 Malignant neoplasm of unspecified part of unspecified bronchus or lung: Secondary | ICD-10-CM

## 2020-04-05 DIAGNOSIS — G620 Drug-induced polyneuropathy: Secondary | ICD-10-CM

## 2020-04-05 DIAGNOSIS — E1151 Type 2 diabetes mellitus with diabetic peripheral angiopathy without gangrene: Secondary | ICD-10-CM | POA: Diagnosis not present

## 2020-04-05 DIAGNOSIS — I739 Peripheral vascular disease, unspecified: Secondary | ICD-10-CM | POA: Diagnosis not present

## 2020-04-05 DIAGNOSIS — T451X5A Adverse effect of antineoplastic and immunosuppressive drugs, initial encounter: Secondary | ICD-10-CM

## 2020-04-05 DIAGNOSIS — F419 Anxiety disorder, unspecified: Secondary | ICD-10-CM

## 2020-04-05 DIAGNOSIS — I10 Essential (primary) hypertension: Secondary | ICD-10-CM

## 2020-04-05 DIAGNOSIS — Z09 Encounter for follow-up examination after completed treatment for conditions other than malignant neoplasm: Secondary | ICD-10-CM

## 2020-04-05 LAB — POCT URINALYSIS DIPSTICK
Bilirubin, UA: NEGATIVE
Glucose, UA: NEGATIVE
Ketones, UA: NEGATIVE
Leukocytes, UA: NEGATIVE
Nitrite, UA: NEGATIVE
Protein, UA: POSITIVE — AB
Spec Grav, UA: 1.01 (ref 1.010–1.025)
Urobilinogen, UA: 0.2 E.U./dL
pH, UA: 5.5 (ref 5.0–8.0)

## 2020-04-05 MED ORDER — GABAPENTIN 300 MG PO CAPS: 300.0000 mg | ORAL_CAPSULE | Freq: Three times a day (TID) | ORAL | 6 refills | Status: DC

## 2020-04-05 NOTE — Progress Notes (Signed)
Patient [facility] Follow Up   Subjective:  Patient ID: Kirk [name], male    DOB: 05/05/1956  Age: 64 y.o. MRN: [id number]  CC:  Chief Complaint  Patient presents with  . Follow-up  . Foot Swelling    X2-3 MONTHS.    HPI Kirk [name] is a 64 year old male who presents for Hospital Follow Up today.    Patient Active Problem List   Diagnosis Date Noted  . Port-A-Cath in place 12/29/2019  . Metastatic non-small cell lung cancer ([place]) 07/07/2019  . Encounter for antineoplastic chemotherapy 07/07/2019  . Encounter for antineoplastic immunotherapy 07/07/2019  . Goals of care, counseling/discussion 06/28/2019  . Adenocarcinoma of right lung, stage 4 ([place]) 06/28/2019  . Cancer associated pain 06/28/2019  . CAD (coronary artery disease) 06/08/2019  . Mediastinal lymphadenopathy 06/03/2019  . Liver metastasis ([place]) 06/03/2019  . Hemoglobin A1C between 7% and 9% indicating borderline diabetic control 05/10/2019  . COVID-19 virus infection 05/10/2019  . Atrial flutter with rapid ventricular response ([place]) 04/19/2019  . Chest pain with moderate risk for cardiac etiology 04/19/2019  . Paroxysmal atrial flutter ([place]) 04/19/2019  . Chest congestion 07/10/2018  . Cough 07/10/2018  . NSTEMI (non-ST elevated myocardial infarction) ([place]) 09/04/2015  . PVD (peripheral vascular disease) ([place]) 06/08/2015  . DDD (degenerative disc disease), cervical 06/08/2015  . Hyperlipidemia 06/08/2015  . Diabetes mellitus with nephropathy ([place]) 06/08/2015  . Diabetes mellitus with peripheral vascular disease ([place]) 06/08/2015  . Annual physical exam 01/03/2015  . PAD (peripheral artery disease) ([place]) 02/18/2013  . Diabetes ([place]) 02/18/2013  . HTN (hypertension) 02/18/2013    Current Status: Since his last office visit, he has had a ED visit for Left Leg Pain on 03/20/2020. He states that he continues have increased left lower extremity pain.  He continues to follow up with Dr. Earlie Server, Oncologist for chemotherapy treatments. He is tolerating his treatments well. His anxiety is stable today. He denies suicidal ideations, homicidal ideations, or auditory hallucinations. He denies fatigue, frequent urination, blurred vision, excessive hunger, excessive thirst, weight gain, weight loss, and poor wound healing. He continues to check his feet regularly. He continues to have intermittent incidents of dizziness. He denies visual changes, chest pain, cough, shortness of breath, heart palpitations, and falls. Denies severe headaches, confusion, seizures, double vision, and blurred vision, nausea and vomiting. He denies fevers, chills, recent infections, weight loss, and night sweats. Denies GI problems such as diarrhea, and constipation. He has no reports of blood in stools, dysuria and hematuria. He is taking all medications as prescribed. He denies pain today.   Past Medical History:  Diagnosis Date  . Arthritis    "right knee" (09/04/2015)  . Atrial flutter with rapid ventricular response ([place]) 04/19/2019  . Dizziness 10/2019  . Dyspnea    increased exertion  . HTN (hypertension)   . Hyperlipidemia   . Lung cancer ([place]) 05/2019  . NSTEMI (non-ST elevated myocardial infarction) ([place]) 09/04/2015   a. cath 09/05/2015: 95% stenosis mid-LAD, 55% mid-RCA, and 40% prox-RCA. PCI performed w/ Synergy DES to LAD  . PAD (peripheral artery disease) (H[facility])    Stenting of bilateral iliacs in 2003.  [name] Refusal of blood transfusions as patient is Jehovah's Witness   . Type II diabetes mellitus ([place])     Past Surgical History:  Procedure Laterality Date  . CARDIAC CATHETERIZATION N/A 09/05/2015   Procedure: Left Heart Cath and Coronary Angiography;  Surgeon:  Troy Sine, MD;  Location: [facility];  Service: Cardiovascular;  Laterality: N/A;  . CARDIAC CATHETERIZATION N/A 09/05/2015   Procedure: Coronary Stent Intervention;  Surgeon: Troy Sine, MD;  Location: [facility];  Service: Cardiovascular;  Laterality: N/A;  . IR IMAGING GUIDED PORT INSERTION  07/15/2019  . 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 PERIPHERAL VASCULAR CATHETERIZATION Bilateral 2003   "stenting"  . TONSILLECTOMY  ~ 1963    Family History  Problem Relation Age of Onset  . Lymphoma Mother   . Cancer - Prostate Father   . CAD Neg Hx   . Colon cancer Neg Hx   . Rectal cancer Neg Hx   . Stomach cancer Neg Hx   . Esophageal cancer Neg Hx     Social History   Socioeconomic History  . Marital status: Married    Spouse name: Not on file  . Number of children: 1  . Years of education: Not on file  . Highest education level: Not on file  Occupational History  . Occupation: Acupuncturist  Tobacco Use  . Smoking status: Former Smoker    Packs/day: 2.00    Years: 35.00    Pack years: 70.00    Types: Cigarettes    Quit date: 02/19/2003    Years since quitting: 17.1  . Smokeless tobacco: Never Used  Vaping Use  . Vaping Use: Never used  Substance and Sexual Activity  . Alcohol use: Not Currently    Alcohol/week: 0.0 standard drinks  . Drug use: Not Currently    Types: Marijuana  . Sexual activity: Not Currently  Other Topics Concern  . Not on file  Social History Narrative  . Not on file   Social Determinants of Health   Financial Resource Strain:   . Difficulty of Paying Living Expenses: Not on file  Food Insecurity:   . Worried About Charity fundraiser in the Last Year: Not on file  . Ran Out of Food in the Last Year: Not on file  Transportation Needs:   . Lack of Transportation (Medical): Not on file  . Lack of Transportation (Non-Medical): Not on file  Physical Activity:   . Days of Exercise per Week: Not on file  . Minutes of Exercise per Session: Not on file  Stress:   . Feeling of Stress : Not on file  Social Connections:   . Frequency of Communication with Friends and Family: Not on  file  . Frequency of Social Gatherings with Friends and Family: Not on file  . Attends Religious Services: Not on file  . Active Member of Clubs or Organizations: Not on file  . Attends Archivist Meetings: Not on file  . Marital Status: Not on file  Intimate Partner Violence:   . Fear of Current or Ex-Partner: Not on file  . Emotionally Abused: Not on file  . Physically Abused: Not on file  . Sexually Abused: Not on file    Outpatient Medications Prior to Visit  Medication Sig Dispense Refill  . acetaminophen (TYLENOL) 500 MG tablet Take 500 mg by mouth every 6 (six) hours as needed for moderate pain or fever.     [name] aspirin EC 81 MG tablet Take 81 mg by mouth at bedtime.    . carvedilol (COREG) 12.5 MG tablet Take 1 tablet (12.5 mg total) by mouth 2 (two) times daily with a meal. 180 tablet  3  . cetirizine (ZYRTEC) 10 MG tablet Take 1 tablet (10 mg total) by mouth daily. 90 tablet 3  . clopidogrel (PLAVIX) 75 MG tablet Take 1 tablet (75 mg total) by mouth daily. 90 tablet 3  . docusate sodium (COLACE) 100 MG capsule Take 1 capsule (100 mg total) by mouth 2 (two) times daily as needed for mild constipation. 60 capsule 11  . ferrous sulfate 324 MG TBEC Take 1 tablet (324 mg total) by mouth daily with breakfast. 30 tablet 11  . folic acid (FOLVITE) 1 MG tablet Take 1 tablet (1 mg total) by mouth daily. 90 tablet 3  . furosemide (LASIX) 20 MG tablet Take 1 tablet (20 mg total) by mouth 2 (two) times daily. 60 tablet 1  . glimepiride (AMARYL) 4 MG tablet Take 1 tablet (4 mg total) by mouth daily. 90 tablet 3  . Hydrocortisone Acetate 1 % CREA Apply 15 g topically 2 (two) times daily. 15 g 3  . ipratropium (ATROVENT) 0.03 % nasal spray USE 2 SPRAYS IN EACH NOSTRIL TWICE DAILY (Patient taking differently: Place 1 spray into both nostrils daily as needed (allergies). ) 30 mL 2  . lactulose (CHRONULAC) 10 GM/15ML solution TAKE 30 MLS (20 G TOTAL) BY MOUTH 2 (TWO) TIMES DAILY AS NEEDED  FOR MODERATE CONSTIPATION. 720 mL 1  . lactulose (CHRONULAC) 10 GM/15ML solution Take 30 mLs (20 g total) by mouth 2 (two) times daily as needed for moderate constipation. 240 mL 11  . lidocaine-prilocaine (EMLA) cream APPLY TO THE PORT-A-CATH SITE 30-60 MINUTES BEFORE TREATMENT. (Patient taking differently: Apply 1 application topically daily as needed (access port). Apply to the Port-A-Cath site 30-60 minutes before treatment.) 30 g 0  . lisinopril (ZESTRIL) 10 MG tablet Take 1 tablet (10 mg total) by mouth daily. 90 tablet 3  . metFORMIN (GLUCOPHAGE) 1000 MG tablet Take 1 tablet (1,000 mg total) by mouth 2 (two) times daily with a meal. 180 tablet 3  . multivitamin (ONE-A-DAY MEN'S) TABS tablet Take 1 tablet by mouth daily. 30 tablet 6  . rosuvastatin (CRESTOR) 40 MG tablet Take 1 tablet (40 mg total) by mouth daily. 90 tablet 3  . meclizine (ANTIVERT) 25 MG tablet Take 1 tablet (25 mg total) by mouth 3 (three) times daily as needed for dizziness. (Patient not taking: Reported on 04/05/2020) 90 tablet 3  . nitroGLYCERIN (NITROSTAT) 0.4 MG SL tablet Place 1 tablet (0.4 mg total) under the tongue every 5 (five) minutes x 3 doses as needed for chest pain. (Patient not taking: Reported on 04/05/2020) 25 tablet 2  . oxyCODONE-acetaminophen (PERCOCET/ROXICET) 5-325 MG tablet Take 1 tablet by mouth every 6 (six) hours as needed for severe pain. (Patient not taking: Reported on 04/05/2020) 30 tablet 0  . pantoprazole (PROTONIX) 40 MG tablet Take 1 tablet (40 mg total) by mouth daily. (Patient not taking: Reported on 04/05/2020) 90 tablet 3  . prochlorperazine (COMPAZINE) 10 MG tablet Take 1 tablet (10 mg total) by mouth every 6 (six) hours as needed for nausea or vomiting. (Patient not taking: Reported on 04/05/2020) 30 tablet 11  . sucralfate (CARAFATE) 1 GM/10ML suspension Take 10 mLs (1 g total) by mouth 4 (four) times daily. (Patient not taking: Reported on 04/05/2020) 420 mL 1  . gabapentin (NEURONTIN)  300 MG capsule Take 1 capsule (300 mg total) by mouth at bedtime. 30 capsule 1   No facility-administered medications prior to visit.    No Known Allergies  ROS Review of Systems  Constitutional: Negative.   HENT: Negative.   Eyes: Negative.   Respiratory: Positive for shortness of breath (occasional ).   Cardiovascular: Negative.   Gastrointestinal: Negative.   Endocrine: Negative.   Genitourinary: Negative.   Musculoskeletal: Positive for arthralgias (generalized).  Skin: Negative.   Allergic/Immunologic: Negative.   Neurological: Positive for dizziness (frequent), weakness (occasional ) and headaches (occasional ).  Hematological: Negative.   Psychiatric/Behavioral: Negative.     Objective:    Physical Exam Vitals and nursing note reviewed.  Constitutional:      Appearance: Normal appearance.  HENT:     Head: Normocephalic and atraumatic.     Nose: Nose normal.     Mouth/Throat:     Mouth: Mucous membranes are moist.     Pharynx: Oropharynx is clear.  Cardiovascular:     Rate and Rhythm: Normal rate and regular rhythm.     Pulses: Normal pulses.     Heart sounds: Normal heart sounds.  Pulmonary:     Effort: Pulmonary effort is normal.     Breath sounds: Normal breath sounds.  Abdominal:     General: Bowel sounds are normal.     Palpations: Abdomen is soft.  Musculoskeletal:        General: Normal range of motion.     Cervical back: Normal range of motion and neck supple.  Skin:    General: Skin is warm and dry.  Neurological:     General: No focal deficit present.     Mental Status: He is alert and oriented to person, place, and time.  Psychiatric:        Mood and Affect: Mood normal.        Behavior: Behavior normal.        Thought Content: Thought content normal.        Judgment: Judgment normal.     BP (!) 116/53 (BP Location: Left Arm, Patient Position: Sitting, Cuff Size: Large)   Pulse 89   Temp 97.7 F (36.5 C)   Ht 6\' 3"  (1.905 m)   Wt  226 lb (102.5 kg)   SpO2 100%   BMI 28.25 kg/m  Wt Readings from Last 3 Encounters:  04/05/20 226 lb (102.5 kg)  03/21/20 222 lb 8 oz (100.9 kg)  03/20/20 220 lb 4.8 oz (99.9 kg)     Health Maintenance Due  Topic Date Due  . FOOT EXAM  07/02/2017  . OPHTHALMOLOGY EXAM  07/11/2018  . COVID-19 Vaccine (2 - Pfizer 2-dose series) 02/08/2020  . PNA vac Low Risk Adult (2 of 2 - PPSV23) 03/21/2020    There are no preventive care reminders to display for this patient.  Lab Results  Component Value Date   TSH 0.793 03/21/2020   Lab Results  Component Value Date   WBC 7.9 03/21/2020   HGB 9.2 (L) 03/21/2020   HCT 28.8 (L) 03/21/2020   MCV 98.0 03/21/2020   PLT 245 03/21/2020   Lab Results  Component Value Date   NA 139 03/21/2020   K 4.0 03/21/2020   CO2 21 (L) 03/21/2020   GLUCOSE 195 (H) 03/21/2020   BUN 40 (H) 03/21/2020   CREATININE 2.28 (H) 03/21/2020   BILITOT 0.3 03/21/2020   ALKPHOS 63 03/21/2020   AST 22 03/21/2020   ALT 16 03/21/2020   PROT 8.5 (H) 03/21/2020   ALBUMIN 3.8 03/21/2020   CALCIUM 9.9 03/21/2020   ANIONGAP 15 03/21/2020   GFR 101.07 05/31/2019   Lab Results  Component Value Date  CHOL 102 07/22/2019   Lab Results  Component Value Date   HDL 34 (L) 07/22/2019   Lab Results  Component Value Date   LDLCALC 48 07/22/2019   Lab Results  Component Value Date   TRIG 106 07/22/2019   Lab Results  Component Value Date   CHOLHDL 3.0 07/22/2019   Lab Results  Component Value Date   HGBA1C 6.5 (A) 11/22/2019   HGBA1C 6.5 11/22/2019   HGBA1C 6.5 (A) 11/22/2019   HGBA1C 6.5 11/22/2019      Assessment & Plan:   1. Metastatic non-small cell lung cancer ([place]) Continue Chemotherapy treatments as scheduled.   2. Chemotherapy-induced neuropathy (HCC) We will increase frequency of Gabapentin today. Monitor.   3. PVD (peripheral vascular disease) ([place])  4. Diabetes mellitus with peripheral vascular disease ([facility]) Stable. Most recent  Hgb A1c at 6.5. monitor.   5. Bilateral lower extremity edema  6. Essential hypertension Blood pressures are decreased today. We will discontinue Lisinopril at this time. Monitor.   7. Dizziness Stable. He will continue to ambulate slowly when changing positions.   8. Anxiety Stable today.   9. Follow up He will follow up in 3 months.    Meds ordered this encounter  Medications  . gabapentin (NEURONTIN) 300 MG capsule    Sig: Take 1 capsule (300 mg total) by mouth 3 (three) times daily.    Dispense:  90 capsule    Refill:  6    Orders Placed This Encounter  Procedures  . Urinalysis Dipstick    Referral Orders  No referral(s) requested today    Kathe Becton,  MSN, FNP-BC [facility] [address] [place], [place] [address] [phone] fax  Problem List Items Addressed This Visit      Cardiovascular and Mediastinum   Diabetes mellitus with peripheral vascular disease ([place])   Relevant Orders   Urinalysis Dipstick   PVD (peripheral vascular disease) ([place])     Respiratory   Metastatic non-small cell lung cancer ([place]) - Primary    Other Visit Diagnoses    Chemotherapy-induced neuropathy ([place])       Relevant Medications   gabapentin (NEURONTIN) 300 MG capsule   Bilateral lower extremity edema       Essential hypertension       Dizziness       Anxiety       Follow up          Meds ordered this encounter  Medications  . gabapentin (NEURONTIN) 300 MG capsule    Sig: Take 1 capsule (300 mg total) by mouth 3 (three) times daily.    Dispense:  90 capsule    Refill:  6    Follow-up: Return in about 3 months (around 07/06/2020).    [name], FNP

## 2020-04-06 ENCOUNTER — Telehealth: Payer: Self-pay

## 2020-04-06 NOTE — Telephone Encounter (Signed)
-----   Message from [name], [place] sent at 04/05/2020  9:45 PM EST ----- Regarding: "Decreased Blood Pressures" Please inform patient to HOLD Lisinopril at this time. He is to continue all other medications as prescribed.

## 2020-04-07 ENCOUNTER — Telehealth: Payer: Self-pay | Admitting: Physician Assistant

## 2020-04-07 NOTE — Telephone Encounter (Signed)
Scheduled per los, patient has been called and notified of upcoming appointments.

## 2020-04-07 NOTE — Progress Notes (Signed)
Kirk [name] OFFICE PROGRESS NOTE  [name], FNP [name] [address]  DIAGNOSIS: Stage IV (TX, N3, M1c) non-small cell lung cancer, poorly differentiated adenocarcinoma presented with bulky mediastinal lymphadenopathy in addition to right axillary lymphadenopathy and metastatic liver lesions, abdominal lymphadenopathy diagnosed in January 2021. Molecular studies by [employer] shows no actionable mutations.  PRIOR THERAPY: None  CURRENT THERAPY: Systemic chemotherapy with carboplatin for AUC of 5, Alimta 500 mg/M2 and Keytruda 200 mg IV every 3 weeks. First dose July 14, 2019. Status post 13cycles. Starting from cycle #5 the patient will be treated with maintenance Alimta and Keytruda every 3 weeks. Alimta dose reduced to 375 mg/m2 starting from cycle #14  INTERVAL HISTORY: Kirk [name] 64 y.o. male returns to the clinic today for a follow-up visit. The patient is feeling well today without any concerning complaints except for left foot pain. The patient was seen in the ER for this concern. He had an ultrasound of the left lower extremity which was negative for DVT. He takes plavix. He was given lasix which was discontinued due to elevated creatinine. He also had a x ray of his foot which was negative for fracture. He saw his PCP for this recently who increased the dose of his gabapentin. He states the bottom and top of his foot feel swollen/sore and it affects his ability to move his toes and ankle. Some of the pain is described as sharp shooting pain.   Otherwise, the patient denies any recent fever, chills, night sweats, or weight loss. He denies any chest pain, cough, shortness of breath, or hemoptysis. He denies any nausea, vomiting, or diarrhea. He reports some occasional baseline constipation. He takes stool softener and a lactulose if needed. He denies any headaches. He states he is overdue for an eye exam and that his vision is not as  good as it used to be. He had a CT scan of the head last month which was negative for metastatic disease to the brain. The patient denies any rashes or skin changes but reports some generalized itching. The patient is here today for evaluation before starting cycle #14 of his treatment.  MEDICAL HISTORY: Past Medical History:  Diagnosis Date  . Arthritis    "right knee" (09/04/2015)  . Atrial flutter with rapid ventricular response ([facility]) 04/19/2019  . Dizziness 10/2019  . Dyspnea    increased exertion  . HTN (hypertension)   . Hyperlipidemia   . Lung cancer ([facility]) 05/2019  . NSTEMI (non-ST elevated myocardial infarction) ([facility]) 09/04/2015   a. cath 09/05/2015: 95% stenosis mid-LAD, 55% mid-RCA, and 40% prox-RCA. PCI performed w/ Synergy DES to LAD  . PAD (peripheral artery disease) ([facility])    Stenting of bilateral iliacs in 2003.  [name] Refusal of blood transfusions as patient is Jehovah's Witness   . Type II diabetes mellitus ([facility])     ALLERGIES:  has No Known Allergies.  MEDICATIONS:  Current Outpatient Medications  Medication Sig Dispense Refill  . acetaminophen (TYLENOL) 500 MG tablet Take 500 mg by mouth every 6 (six) hours as needed for moderate pain or fever.     [name] aspirin EC 81 MG tablet Take 81 mg by mouth at bedtime.    . carvedilol (COREG) 12.5 MG tablet Take 1 tablet (12.5 mg total) by mouth 2 (two) times daily with a meal. 180 tablet 3  . cetirizine (ZYRTEC) 10 MG tablet Take 1 tablet (10 mg total) by mouth daily. [place]  tablet 3  . clopidogrel (PLAVIX) 75 MG tablet Take 1 tablet (75 mg total) by mouth daily. 90 tablet 3  . docusate sodium (COLACE) 100 MG capsule Take 1 capsule (100 mg total) by mouth 2 (two) times daily as needed for mild constipation. 60 capsule 11  . ferrous sulfate 324 MG TBEC Take 1 tablet (324 mg total) by mouth daily with breakfast. 30 tablet 11  . folic acid (FOLVITE) 1 MG tablet Take 1 tablet (1 mg total) by mouth daily. 90 tablet 3  . furosemide (LASIX)  20 MG tablet Take 1 tablet (20 mg total) by mouth 2 (two) times daily. 60 tablet 1  . gabapentin (NEURONTIN) 300 MG capsule Take 1 capsule (300 mg total) by mouth 3 (three) times daily. 90 capsule 6  . glimepiride (AMARYL) 4 MG tablet Take 1 tablet (4 mg total) by mouth daily. 90 tablet 3  . Hydrocortisone Acetate 1 % CREA Apply 15 g topically 2 (two) times daily. 15 g 3  . ipratropium (ATROVENT) 0.03 % nasal spray USE 2 SPRAYS IN EACH NOSTRIL TWICE DAILY (Patient taking differently: Place 1 spray into both nostrils daily as needed (allergies). ) 30 mL 2  . lactulose (CHRONULAC) 10 GM/15ML solution TAKE 30 MLS (20 G TOTAL) BY MOUTH 2 (TWO) TIMES DAILY AS NEEDED FOR MODERATE CONSTIPATION. 720 mL 1  . lactulose (CHRONULAC) 10 GM/15ML solution Take 30 mLs (20 g total) by mouth 2 (two) times daily as needed for moderate constipation. 240 mL 11  . lidocaine-prilocaine (EMLA) cream APPLY TO THE PORT-A-CATH SITE 30-60 MINUTES BEFORE TREATMENT. (Patient taking differently: Apply 1 application topically daily as needed (access port). Apply to the Port-A-Cath site 30-60 minutes before treatment.) 30 g 0  . meclizine (ANTIVERT) 25 MG tablet Take 1 tablet (25 mg total) by mouth 3 (three) times daily as needed for dizziness. (Patient not taking: Reported on 04/05/2020) 90 tablet 3  . metFORMIN (GLUCOPHAGE) 1000 MG tablet Take 1 tablet (1,000 mg total) by mouth 2 (two) times daily with a meal. 180 tablet 3  . multivitamin (ONE-A-DAY MEN'S) TABS tablet Take 1 tablet by mouth daily. 30 tablet 6  . nitroGLYCERIN (NITROSTAT) 0.4 MG SL tablet Place 1 tablet (0.4 mg total) under the tongue every 5 (five) minutes x 3 doses as needed for chest pain. (Patient not taking: Reported on 04/05/2020) 25 tablet 2  . oxyCODONE-acetaminophen (PERCOCET/ROXICET) 5-325 MG tablet Take 1 tablet by mouth every 6 (six) hours as needed for severe pain. 20 tablet 0  . pantoprazole (PROTONIX) 40 MG tablet Take 1 tablet (40 mg total) by mouth  daily. (Patient not taking: Reported on 04/05/2020) 90 tablet 3  . prochlorperazine (COMPAZINE) 10 MG tablet Take 1 tablet (10 mg total) by mouth every 6 (six) hours as needed for nausea or vomiting. (Patient not taking: Reported on 04/05/2020) 30 tablet 11  . rosuvastatin (CRESTOR) 40 MG tablet Take 1 tablet (40 mg total) by mouth daily. 90 tablet 3  . sucralfate (CARAFATE) 1 GM/10ML suspension Take 10 mLs (1 g total) by mouth 4 (four) times daily. (Patient not taking: Reported on 04/05/2020) 420 mL 1   No current facility-administered medications for this visit.   Facility-Administered Medications Ordered in Other Visits  Medication Dose Route Frequency Provider Last Rate Last Admin  . heparin lock flush 100 unit/mL  500 Units Intracatheter Once PRN Curt Bears, MD      . pembrolizumab [facility]) 200 mg in sodium chloride 0.9 % 50 mL chemo  infusion  200 mg Intravenous Once Curt Bears, MD      . PEMEtrexed (ALIMTA) 800 mg in sodium chloride 0.9 % 100 mL chemo infusion  375 mg/m2 (Treatment Plan Recorded) Intravenous Once Curt Bears, MD      . sodium chloride flush (NS) 0.9 % injection 10 mL  10 mL Intracatheter PRN Curt Bears, MD        SURGICAL HISTORY:  Past Surgical History:  Procedure Laterality Date  . CARDIAC CATHETERIZATION N/A 09/05/2015   Procedure: Left Heart Cath and Coronary Angiography;  Surgeon: Troy Sine, MD;  Location: [facility];  Service: Cardiovascular;  Laterality: N/A;  . CARDIAC CATHETERIZATION N/A 09/05/2015   Procedure: Coronary Stent Intervention;  Surgeon: Troy Sine, MD;  Location: [facility];  Service: Cardiovascular;  Laterality: N/A;  . IR IMAGING GUIDED PORT INSERTION  07/15/2019  . 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 PERIPHERAL VASCULAR CATHETERIZATION Bilateral 2003   "stenting"  . TONSILLECTOMY  ~ 1963    REVIEW OF SYSTEMS:   Review of Systems  Constitutional: Positive for  fatigue. Negative for appetite change, chills, fever and unexpected weight change.  HENT:  Negative for mouth sores, nosebleeds, sore throat and trouble swallowing.   Eyes: Negative for eye problems and icterus.  Respiratory: Negative for cough, shortness of breath, hemoptysis, and wheezing.   Cardiovascular: Negative for chest pain and leg swelling.  Gastrointestinal: Positive for some constipation. Negative for abdominal pain, diarrhea, nausea and vomiting.  Genitourinary: Negative for bladder incontinence, difficulty urinating, dysuria, frequency and hematuria.   Musculoskeletal: Positive for left foot pain. Negative for back pain, gait problem, neck pain and neck stiffness.  Skin: Positive for itching. Negative for rash.  Neurological: Negative for extremity weakness, gait problem, headaches, light-headedness and seizures.  Hematological: Negative for adenopathy. Does not bruise/bleed easily.  Psychiatric/Behavioral: Negative for confusion, depression and sleep disturbance. The patient is not nervous/anxious.     PHYSICAL EXAMINATION:  Blood pressure (!) 160/67, pulse 93, temperature 97.7 F (36.5 C), temperature source Tympanic, resp. rate 18, height 6\' 3"  (1.905 m), weight 225 lb 12.8 oz (102.4 kg), SpO2 100 %.  ECOG PERFORMANCE STATUS: 1 - Symptomatic but completely ambulatory  Physical Exam  Constitutional: Oriented to person, place, and time and well-developed, well-nourished, and in no distress.  HENT:  Head: Normocephalic and atraumatic.  Mouth/Throat: Oropharynx is clear and moist. No oropharyngeal exudate.  Eyes: Conjunctivae are normal. Right eye exhibits no discharge. Left eye exhibits no discharge. No scleral icterus.  Neck: Normal range of motion. Neck supple.  Cardiovascular: Normal rate, regular rhythm, normal heart sounds and intact distal pulses.   Pulmonary/Chest: Effort normal and breath sounds normal. No respiratory distress. No wheezes. No rales.  Abdominal:  Soft. Bowel sounds are normal. Exhibits no distension and no mass. There is no tenderness.  Musculoskeletal: Decreased range of motion of left ankle. Exhibits no edema. No erythema of his calf.  Lymphadenopathy:    No cervical adenopathy.  Neurological: Alert and oriented to person, place, and time. Exhibits normal muscle tone. Gait normal. Coordination normal.  Skin: Skin is warm and dry. No rash noted. Not diaphoretic. No erythema. No pallor.  Psychiatric: Mood, memory and judgment normal.  Vitals reviewed.  LABORATORY DATA: Lab Results  Component Value Date   WBC 6.2 04/11/2020   HGB 9.7 (L) 04/11/2020   HCT 30.0 (L) 04/11/2020   MCV 96.5 04/11/2020   PLT  245 04/11/2020      Chemistry      Component Value Date/Time   NA 138 04/11/2020 0930   NA 140 06/08/2015 0840   K 4.4 04/11/2020 0930   CL 105 04/11/2020 0930   CO2 24 04/11/2020 0930   BUN 23 04/11/2020 0930   BUN 15 06/08/2015 0840   CREATININE 1.71 (H) 04/11/2020 0930   CREATININE 1.06 10/01/2016 0855      Component Value Date/Time   CALCIUM 9.8 04/11/2020 0930   ALKPHOS 70 04/11/2020 0930   AST 21 04/11/2020 0930   ALT 13 04/11/2020 0930   BILITOT 0.4 04/11/2020 0930       RADIOGRAPHIC STUDIES:  CT Abdomen Pelvis Wo Contrast  Result Date: 03/16/2020 CLINICAL DATA:  Lung cancer diagnosed in February 2021. Chemotherapy in progress. EXAM: CT CHEST, ABDOMEN AND PELVIS WITHOUT CONTRAST TECHNIQUE: Multidetector CT imaging of the chest, abdomen and pelvis was performed following the standard protocol without IV contrast. COMPARISON:  01/17/2020 FINDINGS: CT CHEST FINDINGS Cardiovascular: Right Port-A-Cath tip: SVC. Coronary, aortic arch, and branch vessel atherosclerotic vascular disease. Stable small pericardial effusion. Mediastinum/Nodes: Subcarinal node 1.7 cm in short axis on image 31 series 7, previously 1.8 cm. Lower thoracic periaortic lymph node 1.0 cm in short axis on image 47 of series 7, previously the  same. Right lower paratracheal lymph node 1.0 cm in short axis on image 22 of series 7, previously the same. Lungs/Pleura: Centrilobular emphysema. Stable scarring in the right middle lobe. Mild scarring in the right lower lobe. Increase conspicuity of mild nodularity along the right hemidiaphragmatic pleural surface measuring 0.4 cm in diameter on image 126 of series 9. Musculoskeletal: Lower thoracic spondylosis. CT ABDOMEN PELVIS FINDINGS Hepatobiliary: Hypodensity corresponding to known hemangioma in segment 5 of the liver. There are previously some subtle hypodense lesions in the liver on postcontrast images, these are not visible on today's noncontrast MRI, possibly indicating resolution but more likely simply not conspicuous without contrast. The gallbladder appears unremarkable. Pancreas: Unremarkable Spleen: Unremarkable Adrenals/Urinary Tract: Unremarkable Stomach/Bowel: Prominent stool throughout the colon favors constipation. Vascular/Lymphatic: Aortoiliac atherosclerotic vascular disease. Left external iliac node 0.8 cm in short axis on image 113 of series 7, previously 0.9 cm. Scattered small central mesenteric lymph nodes. Right gastric lymph node 1.1 cm in short axis on image 57 of series 7, previously 1.0 cm. A separate right gastric/gastrohepatic ligament node measures 1.6 cm in short axis on image 58 of series 7, formerly 1.7 cm. Other regional mildly enlarged right gastric/gastrohepatic ligament lymph nodes are present. These are roughly stable from prior. Reproductive: Unremarkable Other: Hazy stranding in the central mesentery unchanged from prior. Musculoskeletal: Left proximal femoral fixation screws unchanged. Lower lumbar spondylosis and degenerative disc disease with potential foraminal impingement at L2-3, L3-4, and L4-5. Moderate to severe degenerative right hip arthropathy. IMPRESSION: 1. Essentially stable appearance of mild adenopathy in the chest and upper abdomen. 2. 4 mm  pleural-based nodule along the right hemidiaphragm, merits surveillance. 3. Stable nonspecific hazy stranding in the central mesentery, nonspecific but possibly from low-grade sclerosing mesenteritis. 4. Other imaging findings of potential clinical significance: Coronary atherosclerosis. Stable small pericardial effusion. Prominent stool throughout the colon favors constipation. Lower lumbar spondylosis and degenerative disc disease with potential foraminal impingement at L2-3, L3-4, and L4-5. Moderate to severe degenerative right hip arthropathy. 5. Emphysema and aortic atherosclerosis. Aortic Atherosclerosis (ICD10-I70.0) and Emphysema (ICD10-J43.9). Electronically Signed   By: Van Clines M.D.   On: 03/16/2020 15:17   CT Head Wo Contrast  Result Date: 03/16/2020 CLINICAL DATA:  Metastatic disease evaluation. Stage IV lung cancer. Dizziness and right parietal scalp lesion. Assess for metastatic disease. EXAM: CT HEAD WITHOUT CONTRAST TECHNIQUE: Contiguous axial images were obtained from the base of the skull through the vertex without intravenous contrast. COMPARISON:  MRI of the brain and head CT, December 25, 2019. FINDINGS: Brain: No evidence of acute infarction, hemorrhage, hydrocephalus, extra-axial collection or mass lesion/mass effect. Vascular: Calcified plaques in the bilateral carotid siphons. Skull: Stable appearance of hypodense 6 mm left parietal bone lesion. No new lesions identified. Sinuses/Orbits: No acute finding. Other: Interval growth of right parietal scalp lesion now measuring 1.4 cm (1.1 cm on prior). This has nonspecific appearance and may represent an epidermal inclusion cyst. However, the possibility metastatic lesion to the skin cannot be entirely excluded. IMPRESSION: 1. No acute intracranial abnormality. Please, note that noncontrast head CT has low sensitivity for small metastatic lesions. 2. Interval growth of right parietal scalp lesion now measuring 1.4 cm (1.1 cm on  prior). This has a nonspecific appearance and may represent an epidermal inclusion cyst. However, the possibility metastatic lesion to the skin cannot be entirely excluded. Electronically Signed   By: Pedro Earls M.D.   On: 03/16/2020 09:32   CT Chest Wo Contrast  Result Date: 03/16/2020 CLINICAL DATA:  Lung cancer diagnosed in February 2021. Chemotherapy in progress. EXAM: CT CHEST, ABDOMEN AND PELVIS WITHOUT CONTRAST TECHNIQUE: Multidetector CT imaging of the chest, abdomen and pelvis was performed following the standard protocol without IV contrast. COMPARISON:  01/17/2020 FINDINGS: CT CHEST FINDINGS Cardiovascular: Right Port-A-Cath tip: SVC. Coronary, aortic arch, and branch vessel atherosclerotic vascular disease. Stable small pericardial effusion. Mediastinum/Nodes: Subcarinal node 1.7 cm in short axis on image 31 series 7, previously 1.8 cm. Lower thoracic periaortic lymph node 1.0 cm in short axis on image 47 of series 7, previously the same. Right lower paratracheal lymph node 1.0 cm in short axis on image 22 of series 7, previously the same. Lungs/Pleura: Centrilobular emphysema. Stable scarring in the right middle lobe. Mild scarring in the right lower lobe. Increase conspicuity of mild nodularity along the right hemidiaphragmatic pleural surface measuring 0.4 cm in diameter on image 126 of series 9. Musculoskeletal: Lower thoracic spondylosis. CT ABDOMEN PELVIS FINDINGS Hepatobiliary: Hypodensity corresponding to known hemangioma in segment 5 of the liver. There are previously some subtle hypodense lesions in the liver on postcontrast images, these are not visible on today's noncontrast MRI, possibly indicating resolution but more likely simply not conspicuous without contrast. The gallbladder appears unremarkable. Pancreas: Unremarkable Spleen: Unremarkable Adrenals/Urinary Tract: Unremarkable Stomach/Bowel: Prominent stool throughout the colon favors constipation.  Vascular/Lymphatic: Aortoiliac atherosclerotic vascular disease. Left external iliac node 0.8 cm in short axis on image 113 of series 7, previously 0.9 cm. Scattered small central mesenteric lymph nodes. Right gastric lymph node 1.1 cm in short axis on image 57 of series 7, previously 1.0 cm. A separate right gastric/gastrohepatic ligament node measures 1.6 cm in short axis on image 58 of series 7, formerly 1.7 cm. Other regional mildly enlarged right gastric/gastrohepatic ligament lymph nodes are present. These are roughly stable from prior. Reproductive: Unremarkable Other: Hazy stranding in the central mesentery unchanged from prior. Musculoskeletal: Left proximal femoral fixation screws unchanged. Lower lumbar spondylosis and degenerative disc disease with potential foraminal impingement at L2-3, L3-4, and L4-5. Moderate to severe degenerative right hip arthropathy. IMPRESSION: 1. Essentially stable appearance of mild adenopathy in the chest and upper abdomen. 2. 4 mm pleural-based nodule  along the right hemidiaphragm, merits surveillance. 3. Stable nonspecific hazy stranding in the central mesentery, nonspecific but possibly from low-grade sclerosing mesenteritis. 4. Other imaging findings of potential clinical significance: Coronary atherosclerosis. Stable small pericardial effusion. Prominent stool throughout the colon favors constipation. Lower lumbar spondylosis and degenerative disc disease with potential foraminal impingement at L2-3, L3-4, and L4-5. Moderate to severe degenerative right hip arthropathy. 5. Emphysema and aortic atherosclerosis. Aortic Atherosclerosis (ICD10-I70.0) and Emphysema (ICD10-J43.9). Electronically Signed   By: Van Clines M.D.   On: 03/16/2020 15:17   DG Foot Complete Left  Result Date: 03/20/2020 CLINICAL DATA:  Pain EXAM: LEFT FOOT - COMPLETE 3+ VIEW COMPARISON:  None. FINDINGS: There is no evidence of fracture or dislocation. There is no evidence of arthropathy or  other focal bone abnormality. Soft tissues are unremarkable. IMPRESSION: Negative. Electronically Signed   By: Constance Holster M.D.   On: 03/20/2020 16:55   VAS [place] LOWER EXTREMITY VENOUS (DVT) (ONLY [facility] & [facility] 7a-7p)  Result Date: 03/20/2020  Lower Venous DVTStudy Indications: Swelling of foot/ankle.  Comparison Study: No prior studies. Performing Technologist: Darlin Coco  Examination Guidelines: A complete evaluation includes B-mode imaging, spectral Doppler, color Doppler, and power Doppler as needed of all accessible portions of each vessel. Bilateral testing is considered an integral part of a complete examination. Limited examinations for reoccurring indications may be performed as noted. The reflux portion of the exam is performed with the patient in reverse Trendelenburg.  +-----+---------------+---------+-----------+----------+--------------+ RIGHTCompressibilityPhasicitySpontaneityPropertiesThrombus Aging +-----+---------------+---------+-----------+----------+--------------+ CFV  Full           Yes      Yes                                 +-----+---------------+---------+-----------+----------+--------------+   +---------+---------------+---------+-----------+----------+--------------+ LEFT     CompressibilityPhasicitySpontaneityPropertiesThrombus Aging +---------+---------------+---------+-----------+----------+--------------+ CFV      Full           Yes      Yes                                 +---------+---------------+---------+-----------+----------+--------------+ SFJ      Full                                                        +---------+---------------+---------+-----------+----------+--------------+ FV Prox  Full                                                        +---------+---------------+---------+-----------+----------+--------------+ FV Mid   Full                                                         +---------+---------------+---------+-----------+----------+--------------+ FV DistalFull                                                        +---------+---------------+---------+-----------+----------+--------------+  PFV      Full                                                        +---------+---------------+---------+-----------+----------+--------------+ POP      Full           Yes      Yes                                 +---------+---------------+---------+-----------+----------+--------------+ PTV      Full                                                        +---------+---------------+---------+-----------+----------+--------------+ PERO     Full                                                        +---------+---------------+---------+-----------+----------+--------------+     Summary: RIGHT: - No evidence of common femoral vein obstruction.  LEFT: - There is no evidence of deep vein thrombosis in the lower extremity.  - No cystic structure found in the popliteal fossa. -Incidental: Monophasic arterial waveforms noted throughout left lower extremity.  *See table(s) above for measurements and observations. Electronically signed by Ruta Hinds MD on 03/20/2020 at 6:20:37 PM.    Final      ASSESSMENT/PLAN:  This is a very pleasant 64 year old African-American male diagnosed with stage IV (TX, N3, M1C) non-small cell lung cancer, poorly differentiated adenocarcinoma. He presented with bulky mediastinal as well as right hilar, supraclavicular, abdominal, and right axillary lymphadenopathy in addition to metastatic disease to the liver. He was diagnosed in January 2021. The patient does not have any actionable mutations. The patient was not a candidate for treatment with targeted therapy or enrollment in a clinical trial with the [facility] regimen.  The patient is currently undergoing systemic chemotherapy with carboplatin for an AUC of 5, Alimta 500 mg per  metered squared, Keytruda 200 mg IV every 3 weeks. He is status post 13 cycles. Starting from cycle #5 patient has been on maintenance Alimta and Keytruda IV every 3 weeks.  Due to the patient being a Jehovah's Witness, he has persistent anemia in which he is currently on oral iron tablets.  The patient's labs were reviewed with Dr. [name]. His creatinine is 1.7a today. He will proceed with cycle #14 today as scheduled with keytruda and dose reduced alimta 375 mg/m2.   We will see him back for follow-up visit in 3 weeks for evaluation before starting cycle #15.  For his foot/ankle pain, no evidence of erythema or swelling of the calf. Doppler of his calf negative for DVT. Advised the patient to follow up with PCP if no improvement. Likely musculoskeletal due to soreness with ankle movements. No evidence of fractures on recent x-ray. They may consider referral to orthopedics if no improvement. The patient was also advised to use salonpas cream. We will refill 20 tablets of oxycodone; however,  advised the patient to use this sparingly as it may exacerbate constipation. Recommend conservative measures such as tylenol first.   The patient's blood pressure was elevated today. His lisinopril was discontinued by his PCP likely due to his elevated creatinine. Advised the patient to monitor his BP at home and record his readings. If his BP continues to be elevated, he may need to see his PCP sooner for BP management.   The patient was advised to call immediately if he has any concerning symptoms in the interval. The patient voices understanding of current disease status and treatment options and is in agreement with the current care plan. All questions were answered. The patient knows to call the clinic with any problems, questions or concerns. We can certainly see the patient much sooner if necessary     No orders of the defined types were placed in this encounter.    Kirk Bianchini L Ayane Delancey,  PA-C 04/11/20

## 2020-04-11 ENCOUNTER — Inpatient Hospital Stay: Payer: Medicare Other | Attending: Internal Medicine

## 2020-04-11 ENCOUNTER — Inpatient Hospital Stay: Payer: Medicare Other

## 2020-04-11 ENCOUNTER — Other Ambulatory Visit: Payer: Self-pay

## 2020-04-11 ENCOUNTER — Inpatient Hospital Stay (HOSPITAL_BASED_OUTPATIENT_CLINIC_OR_DEPARTMENT_OTHER): Payer: Medicare Other | Admitting: Physician Assistant

## 2020-04-11 VITALS — BP 160/67 | HR 93 | Temp 97.7°F | Resp 18 | Ht 75.0 in | Wt 225.8 lb

## 2020-04-11 DIAGNOSIS — E785 Hyperlipidemia, unspecified: Secondary | ICD-10-CM | POA: Diagnosis not present

## 2020-04-11 DIAGNOSIS — Z5111 Encounter for antineoplastic chemotherapy: Secondary | ICD-10-CM | POA: Insufficient documentation

## 2020-04-11 DIAGNOSIS — Z7984 Long term (current) use of oral hypoglycemic drugs: Secondary | ICD-10-CM | POA: Diagnosis not present

## 2020-04-11 DIAGNOSIS — M79672 Pain in left foot: Secondary | ICD-10-CM | POA: Diagnosis not present

## 2020-04-11 DIAGNOSIS — K5900 Constipation, unspecified: Secondary | ICD-10-CM | POA: Insufficient documentation

## 2020-04-11 DIAGNOSIS — C3490 Malignant neoplasm of unspecified part of unspecified bronchus or lung: Secondary | ICD-10-CM | POA: Insufficient documentation

## 2020-04-11 DIAGNOSIS — Z5112 Encounter for antineoplastic immunotherapy: Secondary | ICD-10-CM | POA: Diagnosis present

## 2020-04-11 DIAGNOSIS — C787 Secondary malignant neoplasm of liver and intrahepatic bile duct: Secondary | ICD-10-CM | POA: Insufficient documentation

## 2020-04-11 DIAGNOSIS — I10 Essential (primary) hypertension: Secondary | ICD-10-CM | POA: Insufficient documentation

## 2020-04-11 DIAGNOSIS — Z95828 Presence of other vascular implants and grafts: Secondary | ICD-10-CM

## 2020-04-11 DIAGNOSIS — Z79899 Other long term (current) drug therapy: Secondary | ICD-10-CM | POA: Insufficient documentation

## 2020-04-11 DIAGNOSIS — C3491 Malignant neoplasm of unspecified part of right bronchus or lung: Secondary | ICD-10-CM

## 2020-04-11 DIAGNOSIS — E1151 Type 2 diabetes mellitus with diabetic peripheral angiopathy without gangrene: Secondary | ICD-10-CM | POA: Diagnosis not present

## 2020-04-11 DIAGNOSIS — D509 Iron deficiency anemia, unspecified: Secondary | ICD-10-CM | POA: Diagnosis not present

## 2020-04-11 DIAGNOSIS — R5383 Other fatigue: Secondary | ICD-10-CM

## 2020-04-11 DIAGNOSIS — Z7982 Long term (current) use of aspirin: Secondary | ICD-10-CM | POA: Insufficient documentation

## 2020-04-11 LAB — CBC WITH DIFFERENTIAL (CANCER CENTER ONLY)
Abs Immature Granulocytes: 0.02 10*3/uL (ref 0.00–0.07)
Basophils Absolute: 0.1 10*3/uL (ref 0.0–0.1)
Basophils Relative: 1 %
Eosinophils Absolute: 0.3 10*3/uL (ref 0.0–0.5)
Eosinophils Relative: 4 %
HCT: 30 % — ABNORMAL LOW (ref 39.0–52.0)
Hemoglobin: 9.7 g/dL — ABNORMAL LOW (ref 13.0–17.0)
Immature Granulocytes: 0 %
Lymphocytes Relative: 39 %
Lymphs Abs: 2.4 10*3/uL (ref 0.7–4.0)
MCH: 31.2 pg (ref 26.0–34.0)
MCHC: 32.3 g/dL (ref 30.0–36.0)
MCV: 96.5 fL (ref 80.0–100.0)
Monocytes Absolute: 0.7 10*3/uL (ref 0.1–1.0)
Monocytes Relative: 11 %
Neutro Abs: 2.7 10*3/uL (ref 1.7–7.7)
Neutrophils Relative %: 45 %
Platelet Count: 245 10*3/uL (ref 150–400)
RBC: 3.11 MIL/uL — ABNORMAL LOW (ref 4.22–5.81)
RDW: 13.4 % (ref 11.5–15.5)
WBC Count: 6.2 10*3/uL (ref 4.0–10.5)
nRBC: 0 % (ref 0.0–0.2)

## 2020-04-11 LAB — CMP (CANCER CENTER ONLY)
ALT: 13 U/L (ref 0–44)
AST: 21 U/L (ref 15–41)
Albumin: 3.7 g/dL (ref 3.5–5.0)
Alkaline Phosphatase: 70 U/L (ref 38–126)
Anion gap: 9 (ref 5–15)
BUN: 23 mg/dL (ref 8–23)
CO2: 24 mmol/L (ref 22–32)
Calcium: 9.8 mg/dL (ref 8.9–10.3)
Chloride: 105 mmol/L (ref 98–111)
Creatinine: 1.71 mg/dL — ABNORMAL HIGH (ref 0.61–1.24)
GFR, Estimated: 44 mL/min — ABNORMAL LOW (ref >60)
Glucose, Bld: 224 mg/dL — ABNORMAL HIGH (ref 70–99)
Potassium: 4.4 mmol/L (ref 3.5–5.1)
Sodium: 138 mmol/L (ref 135–145)
Total Bilirubin: 0.4 mg/dL (ref 0.3–1.2)
Total Protein: 8.6 g/dL — ABNORMAL HIGH (ref 6.5–8.1)

## 2020-04-11 LAB — TSH: TSH: 0.71 u[IU]/mL (ref 0.320–4.118)

## 2020-04-11 MED ORDER — OXYCODONE-ACETAMINOPHEN 5-325 MG PO TABS: 1.0000 | ORAL_TABLET | Freq: Four times a day (QID) | ORAL | 0 refills | Status: DC | PRN

## 2020-04-11 MED ORDER — SODIUM CHLORIDE 0.9 % IV SOLN
200.0000 mg | Freq: Once | INTRAVENOUS | Status: AC
  Administered 2020-04-11: 200 mg via INTRAVENOUS
  Filled 2020-04-11: qty 8

## 2020-04-11 MED ORDER — CYANOCOBALAMIN 1000 MCG/ML IJ SOLN
INTRAMUSCULAR | Status: AC
  Filled 2020-04-11: qty 1

## 2020-04-11 MED ORDER — SODIUM CHLORIDE 0.9 % IV SOLN
Freq: Once | INTRAVENOUS | Status: AC
  Filled 2020-04-11: qty 250

## 2020-04-11 MED ORDER — CYANOCOBALAMIN 1000 MCG/ML IJ SOLN
1000.0000 ug | Freq: Once | INTRAMUSCULAR | Status: AC
  Administered 2020-04-11: 1000 ug via INTRAMUSCULAR

## 2020-04-11 MED ORDER — SODIUM CHLORIDE 0.9% FLUSH
10.0000 mL | INTRAVENOUS | Status: DC | PRN
  Filled 2020-04-11: qty 10

## 2020-04-11 MED ORDER — HEPARIN SOD (PORK) LOCK FLUSH 100 UNIT/ML IV SOLN
500.0000 [IU] | Freq: Once | INTRAVENOUS | Status: DC | PRN
  Filled 2020-04-11: qty 5

## 2020-04-11 MED ORDER — SODIUM CHLORIDE 0.9% FLUSH
10.0000 mL | Freq: Once | INTRAVENOUS | Status: AC
  Administered 2020-04-11: 10 mL
  Filled 2020-04-11: qty 10

## 2020-04-11 MED ORDER — SODIUM CHLORIDE 0.9 % IV SOLN
375.0000 mg/m2 | Freq: Once | INTRAVENOUS | Status: AC
  Administered 2020-04-11: 800 mg via INTRAVENOUS
  Filled 2020-04-11: qty 20

## 2020-04-11 MED ORDER — PROCHLORPERAZINE MALEATE 10 MG PO TABS
10.0000 mg | ORAL_TABLET | Freq: Once | ORAL | Status: AC
  Administered 2020-04-11: 10 mg via ORAL

## 2020-04-11 NOTE — Progress Notes (Signed)
Ok to treat with elevated creatinine per heilingoetter PA

## 2020-04-11 NOTE — Patient Instructions (Signed)

## 2020-04-11 NOTE — Patient Instructions (Signed)
[facility] Discharge Instructions for Patients Receiving Chemotherapy  Today you received the following chemotherapy agents: keytruda, alimta  To help prevent nausea and vomiting after your treatment, we encourage you to take your nausea medication as directed.    If you develop nausea and vomiting that is not controlled by your nausea medication, call the clinic.   BELOW ARE SYMPTOMS THAT SHOULD BE REPORTED IMMEDIATELY:  *FEVER GREATER THAN 100.5 F  *CHILLS WITH OR WITHOUT FEVER  NAUSEA AND VOMITING THAT IS NOT CONTROLLED WITH YOUR NAUSEA MEDICATION  *UNUSUAL SHORTNESS OF BREATH  *UNUSUAL BRUISING OR BLEEDING  TENDERNESS IN MOUTH AND THROAT WITH OR WITHOUT PRESENCE OF ULCERS  *URINARY PROBLEMS  *BOWEL PROBLEMS  UNUSUAL RASH Items with * indicate a potential emergency and should be followed up as soon as possible.  Feel free to call the clinic should you have any questions or concerns. The clinic phone number is [phone].  Please show the [facility] at check-in to the Emergency Department and triage nurse.

## 2020-04-11 NOTE — Progress Notes (Signed)
CrCl 61.5 ml/min Dr [name] decreased dose of Alimta today to 375 mg/m2.  Henreitta Leber, PharmD

## 2020-05-02 ENCOUNTER — Inpatient Hospital Stay (HOSPITAL_BASED_OUTPATIENT_CLINIC_OR_DEPARTMENT_OTHER): Payer: Medicare Other | Admitting: Internal Medicine

## 2020-05-02 ENCOUNTER — Inpatient Hospital Stay: Payer: Medicare Other | Attending: Internal Medicine

## 2020-05-02 ENCOUNTER — Inpatient Hospital Stay: Payer: Medicare Other

## 2020-05-02 ENCOUNTER — Other Ambulatory Visit: Payer: Self-pay | Admitting: Medical Oncology

## 2020-05-02 ENCOUNTER — Other Ambulatory Visit: Payer: Self-pay

## 2020-05-02 ENCOUNTER — Encounter: Payer: Self-pay | Admitting: Internal Medicine

## 2020-05-02 VITALS — BP 126/65 | HR 88 | Temp 98.3°F | Resp 18 | Ht 75.0 in | Wt 227.5 lb

## 2020-05-02 DIAGNOSIS — C787 Secondary malignant neoplasm of liver and intrahepatic bile duct: Secondary | ICD-10-CM

## 2020-05-02 DIAGNOSIS — Z7984 Long term (current) use of oral hypoglycemic drugs: Secondary | ICD-10-CM | POA: Diagnosis not present

## 2020-05-02 DIAGNOSIS — I252 Old myocardial infarction: Secondary | ICD-10-CM | POA: Insufficient documentation

## 2020-05-02 DIAGNOSIS — M1711 Unilateral primary osteoarthritis, right knee: Secondary | ICD-10-CM | POA: Insufficient documentation

## 2020-05-02 DIAGNOSIS — N289 Disorder of kidney and ureter, unspecified: Secondary | ICD-10-CM | POA: Insufficient documentation

## 2020-05-02 DIAGNOSIS — I2510 Atherosclerotic heart disease of native coronary artery without angina pectoris: Secondary | ICD-10-CM | POA: Diagnosis not present

## 2020-05-02 DIAGNOSIS — Z5112 Encounter for antineoplastic immunotherapy: Secondary | ICD-10-CM

## 2020-05-02 DIAGNOSIS — R42 Dizziness and giddiness: Secondary | ICD-10-CM | POA: Diagnosis not present

## 2020-05-02 DIAGNOSIS — Z79899 Other long term (current) drug therapy: Secondary | ICD-10-CM | POA: Insufficient documentation

## 2020-05-02 DIAGNOSIS — E1151 Type 2 diabetes mellitus with diabetic peripheral angiopathy without gangrene: Secondary | ICD-10-CM | POA: Diagnosis not present

## 2020-05-02 DIAGNOSIS — I10 Essential (primary) hypertension: Secondary | ICD-10-CM | POA: Insufficient documentation

## 2020-05-02 DIAGNOSIS — E785 Hyperlipidemia, unspecified: Secondary | ICD-10-CM | POA: Insufficient documentation

## 2020-05-02 DIAGNOSIS — C3491 Malignant neoplasm of unspecified part of right bronchus or lung: Secondary | ICD-10-CM

## 2020-05-02 DIAGNOSIS — Z95828 Presence of other vascular implants and grafts: Secondary | ICD-10-CM

## 2020-05-02 DIAGNOSIS — Z7982 Long term (current) use of aspirin: Secondary | ICD-10-CM | POA: Insufficient documentation

## 2020-05-02 DIAGNOSIS — C3490 Malignant neoplasm of unspecified part of unspecified bronchus or lung: Secondary | ICD-10-CM

## 2020-05-02 DIAGNOSIS — K5900 Constipation, unspecified: Secondary | ICD-10-CM | POA: Insufficient documentation

## 2020-05-02 DIAGNOSIS — D649 Anemia, unspecified: Secondary | ICD-10-CM | POA: Diagnosis not present

## 2020-05-02 DIAGNOSIS — R5383 Other fatigue: Secondary | ICD-10-CM | POA: Diagnosis not present

## 2020-05-02 LAB — CMP (CANCER CENTER ONLY)
ALT: 29 U/L (ref 0–44)
AST: 31 U/L (ref 15–41)
Albumin: 3.4 g/dL — ABNORMAL LOW (ref 3.5–5.0)
Alkaline Phosphatase: 69 U/L (ref 38–126)
Anion gap: 8 (ref 5–15)
BUN: 20 mg/dL (ref 8–23)
CO2: 25 mmol/L (ref 22–32)
Calcium: 9.5 mg/dL (ref 8.9–10.3)
Chloride: 108 mmol/L (ref 98–111)
Creatinine: 1.67 mg/dL — ABNORMAL HIGH (ref 0.61–1.24)
GFR, Estimated: 45 mL/min — ABNORMAL LOW (ref >60)
Glucose, Bld: 145 mg/dL — ABNORMAL HIGH (ref 70–99)
Potassium: 3.7 mmol/L (ref 3.5–5.1)
Sodium: 141 mmol/L (ref 135–145)
Total Bilirubin: <0.2 mg/dL — ABNORMAL LOW (ref 0.3–1.2)
Total Protein: 8.7 g/dL — ABNORMAL HIGH (ref 6.5–8.1)

## 2020-05-02 LAB — CBC WITH DIFFERENTIAL (CANCER CENTER ONLY)
Abs Immature Granulocytes: 0.01 10*3/uL (ref 0.00–0.07)
Basophils Absolute: 0.1 10*3/uL (ref 0.0–0.1)
Basophils Relative: 1 %
Eosinophils Absolute: 0.1 10*3/uL (ref 0.0–0.5)
Eosinophils Relative: 1 %
HCT: 28.7 % — ABNORMAL LOW (ref 39.0–52.0)
Hemoglobin: 9.2 g/dL — ABNORMAL LOW (ref 13.0–17.0)
Immature Granulocytes: 0 %
Lymphocytes Relative: 47 %
Lymphs Abs: 3.2 10*3/uL (ref 0.7–4.0)
MCH: 31.4 pg (ref 26.0–34.0)
MCHC: 32.1 g/dL (ref 30.0–36.0)
MCV: 98 fL (ref 80.0–100.0)
Monocytes Absolute: 0.9 10*3/uL (ref 0.1–1.0)
Monocytes Relative: 13 %
Neutro Abs: 2.7 10*3/uL (ref 1.7–7.7)
Neutrophils Relative %: 38 %
Platelet Count: 417 10*3/uL — ABNORMAL HIGH (ref 150–400)
RBC: 2.93 MIL/uL — ABNORMAL LOW (ref 4.22–5.81)
RDW: 14.6 % (ref 11.5–15.5)
WBC Count: 6.9 10*3/uL (ref 4.0–10.5)
nRBC: 0 % (ref 0.0–0.2)

## 2020-05-02 LAB — TSH: TSH: 1.039 u[IU]/mL (ref 0.320–4.118)

## 2020-05-02 MED ORDER — SODIUM CHLORIDE 0.9% FLUSH
10.0000 mL | Freq: Once | INTRAVENOUS | Status: AC
  Administered 2020-05-02: 10 mL
  Filled 2020-05-02: qty 10

## 2020-05-02 MED ORDER — CYANOCOBALAMIN 1000 MCG/ML IJ SOLN: 1000.0000 ug | Freq: Once | INTRAMUSCULAR | Status: DC

## 2020-05-02 MED ORDER — CYANOCOBALAMIN 1000 MCG/ML IJ SOLN
INTRAMUSCULAR | Status: AC
  Filled 2020-05-02: qty 1

## 2020-05-02 MED ORDER — PROCHLORPERAZINE MALEATE 10 MG PO TABS
ORAL_TABLET | ORAL | Status: AC
  Filled 2020-05-02: qty 1

## 2020-05-02 MED ORDER — HEPARIN SOD (PORK) LOCK FLUSH 100 UNIT/ML IV SOLN
500.0000 [IU] | Freq: Once | INTRAVENOUS | Status: AC | PRN
  Administered 2020-05-02: 500 [IU]
  Filled 2020-05-02: qty 5

## 2020-05-02 MED ORDER — SODIUM CHLORIDE 0.9% FLUSH
10.0000 mL | INTRAVENOUS | Status: DC | PRN
  Administered 2020-05-02: 10 mL
  Filled 2020-05-02: qty 10

## 2020-05-02 MED ORDER — SODIUM CHLORIDE 0.9 % IV SOLN
200.0000 mg | Freq: Once | INTRAVENOUS | Status: AC
  Administered 2020-05-02: 200 mg via INTRAVENOUS
  Filled 2020-05-02: qty 8

## 2020-05-02 MED ORDER — SODIUM CHLORIDE 0.9 % IV SOLN: 500.0000 mg/m2 | Freq: Once | INTRAVENOUS | Status: DC

## 2020-05-02 MED ORDER — SODIUM CHLORIDE 0.9 % IV SOLN
Freq: Once | INTRAVENOUS | Status: AC
  Filled 2020-05-02: qty 250

## 2020-05-02 MED ORDER — PROCHLORPERAZINE MALEATE 10 MG PO TABS: 10.0000 mg | ORAL_TABLET | Freq: Once | ORAL | Status: DC

## 2020-05-02 NOTE — Progress Notes (Signed)
[name] Telephone:([phone]   [phone]) [phone]  OFFICE PROGRESS NOTE  [name], FNP [name] [address]  DIAGNOSIS: Stage IV (TX, N3, M1c) non-small cell lung cancer, poorly differentiated adenocarcinoma presented with bulky mediastinal lymphadenopathy in addition to right axillary lymphadenopathy and metastatic liver lesions, abdominal lymphadenopathy diagnosed in January 2021. Molecular studies by [employer] that shows no actionable mutations.  PRIOR THERAPY: None  CURRENT THERAPY: Systemic chemotherapy with carboplatin for AUC of 5, Alimta 500 mg/M2 and Keytruda 200 mg IV every 3 weeks.  First dose July 14, 2019.  Status post 14 cycles. Starting from cycle #5 the patient will be treated with maintenance Alimta and Keytruda every 3 weeks.  Starting from cycle #12 he will be on single agent treatment with Keytruda secondary to renal insufficiency.  INTERVAL HISTORY: Kirk [name] 64 y.o. male returns to the clinic today for follow-up visit.  The patient is feeling fine today with no concerning complaints except for the fatigue as well as occasional dizzy spells.  He is currently on meclizine.  He had CT of the head performed less than 2 months ago and it was unremarkable for any metastatic disease to the brain.  He denied having any current chest pain, shortness of breath, cough or hemoptysis.  He denied having any fever or chills.  He has no nausea, vomiting or diarrhea but has occasional constipation and he stopped taking the oral iron tablets because of the constipation.  He has bleeding hemorrhoids.  The patient is here today for evaluation before starting cycle #15 of his treatment.  MEDICAL HISTORY: Past Medical History:  Diagnosis Date  . Arthritis    "right knee" (09/04/2015)  . Atrial flutter with rapid ventricular response ([facility]) 04/19/2019  . Dizziness 10/2019  . Dyspnea    increased exertion  . HTN  (hypertension)   . Hyperlipidemia   . Lung cancer ([facility]) 05/2019  . NSTEMI (non-ST elevated myocardial infarction) ([facility]) 09/04/2015   a. cath 09/05/2015: 95% stenosis mid-LAD, 55% mid-RCA, and 40% prox-RCA. PCI performed w/ Synergy DES to LAD  . PAD (peripheral artery disease) (HCC)    Stenting of bilateral iliacs in 2003.  [name] Refusal of blood transfusions as patient is Jehovah's Witness   . Type II diabetes mellitus (HCC)     ALLERGIES:  has No Known Allergies.  MEDICATIONS:  Current Outpatient Medications  Medication Sig Dispense Refill  . acetaminophen (TYLENOL) 500 MG tablet Take 500 mg by mouth every 6 (six) hours as needed for moderate pain or fever.     [name] aspirin EC 81 MG tablet Take 81 mg by mouth at bedtime.    . carvedilol (COREG) 12.5 MG tablet Take 1 tablet (12.5 mg total) by mouth 2 (two) times daily with a meal. 180 tablet 3  . cetirizine (ZYRTEC) 10 MG tablet Take 1 tablet (10 mg total) by mouth daily. 90 tablet 3  . clopidogrel (PLAVIX) 75 MG tablet Take 1 tablet (75 mg total) by mouth daily. 90 tablet 3  . docusate sodium (COLACE) 100 MG capsule Take 1 capsule (100 mg total) by mouth 2 (two) times daily as needed for mild constipation. 60 capsule 11  . ferrous sulfate 324 MG TBEC Take 1 tablet (324 mg total) by mouth daily with breakfast. 30 tablet 11  . folic acid (FOLVITE) 1 MG tablet Take 1 tablet (1 mg total) by mouth daily. 90 tablet 3  . furosemide (LASIX)  20 MG tablet Take 1 tablet (20 mg total) by mouth 2 (two) times daily. 60 tablet 1  . gabapentin (NEURONTIN) 300 MG capsule Take 1 capsule (300 mg total) by mouth 3 (three) times daily. 90 capsule 6  . glimepiride (AMARYL) 4 MG tablet Take 1 tablet (4 mg total) by mouth daily. 90 tablet 3  . Hydrocortisone Acetate 1 % CREA Apply 15 g topically 2 (two) times daily. 15 g 3  . ipratropium (ATROVENT) 0.03 % nasal spray USE 2 SPRAYS IN EACH NOSTRIL TWICE DAILY (Patient taking differently: Place 1 spray into both nostrils  daily as needed (allergies). ) 30 mL 2  . lactulose (CHRONULAC) 10 GM/15ML solution Take 30 mLs (20 g total) by mouth 2 (two) times daily as needed for moderate constipation. 240 mL 11  . lidocaine-prilocaine (EMLA) cream APPLY TO THE PORT-A-CATH SITE 30-60 MINUTES BEFORE TREATMENT. (Patient taking differently: Apply 1 application topically daily as needed (access port). Apply to the Port-A-Cath site 30-60 minutes before treatment.) 30 g 0  . meclizine (ANTIVERT) 25 MG tablet Take 1 tablet (25 mg total) by mouth 3 (three) times daily as needed for dizziness. (Patient not taking: Reported on 04/05/2020) 90 tablet 3  . metFORMIN (GLUCOPHAGE) 1000 MG tablet Take 1 tablet (1,000 mg total) by mouth 2 (two) times daily with a meal. 180 tablet 3  . multivitamin (ONE-A-DAY MEN'S) TABS tablet Take 1 tablet by mouth daily. 30 tablet 6  . nitroGLYCERIN (NITROSTAT) 0.4 MG SL tablet Place 1 tablet (0.4 mg total) under the tongue every 5 (five) minutes x 3 doses as needed for chest pain. (Patient not taking: Reported on 04/05/2020) 25 tablet 2  . oxyCODONE-acetaminophen (PERCOCET/ROXICET) 5-325 MG tablet Take 1 tablet by mouth every 6 (six) hours as needed for severe pain. 20 tablet 0  . pantoprazole (PROTONIX) 40 MG tablet Take 1 tablet (40 mg total) by mouth daily. (Patient not taking: Reported on 04/05/2020) 90 tablet 3  . prochlorperazine (COMPAZINE) 10 MG tablet Take 1 tablet (10 mg total) by mouth every 6 (six) hours as needed for nausea or vomiting. (Patient not taking: Reported on 04/05/2020) 30 tablet 11  . rosuvastatin (CRESTOR) 40 MG tablet Take 1 tablet (40 mg total) by mouth daily. 90 tablet 3  . sucralfate (CARAFATE) 1 GM/10ML suspension Take 10 mLs (1 g total) by mouth 4 (four) times daily. (Patient not taking: Reported on 04/05/2020) 420 mL 1   No current facility-administered medications for this visit.    SURGICAL HISTORY:  Past Surgical History:  Procedure Laterality Date  . CARDIAC  CATHETERIZATION N/A 09/05/2015   Procedure: Left Heart Cath and Coronary Angiography;  Surgeon: Troy Sine, MD;  Location: [facility];  Service: Cardiovascular;  Laterality: N/A;  . CARDIAC CATHETERIZATION N/A 09/05/2015   Procedure: Coronary Stent Intervention;  Surgeon: Troy Sine, MD;  Location: [facility];  Service: Cardiovascular;  Laterality: N/A;  . IR IMAGING GUIDED PORT INSERTION  07/15/2019  . 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 PERIPHERAL VASCULAR CATHETERIZATION Bilateral 2003   "stenting"  . TONSILLECTOMY  ~ 1963    REVIEW OF SYSTEMS:  A comprehensive review of systems was negative except for: Constitutional: positive for fatigue Gastrointestinal: positive for constipation Neurological: positive for dizziness   PHYSICAL EXAMINATION: General appearance: alert, cooperative, appears stated age, fatigued and no distress Head: Normocephalic, without obvious abnormality, atraumatic Neck: no adenopathy, no JVD, supple, symmetrical, trachea  midline and thyroid not enlarged, symmetric, no tenderness/mass/nodules Lymph nodes: Cervical, supraclavicular, and axillary nodes normal. Resp: clear to auscultation bilaterally Back: symmetric, no curvature. ROM normal. No CVA tenderness. Cardio: regular rate and rhythm, S1, S2 normal, no murmur, click, rub or gallop GI: soft, non-tender; bowel sounds normal; no masses,  no organomegaly Extremities: extremities normal, atraumatic, no cyanosis or edema  ECOG PERFORMANCE STATUS: 1 - Symptomatic but completely ambulatory  Blood pressure 126/65, pulse 88, temperature 98.3 F (36.8 C), temperature source Tympanic, resp. rate 18, height 6\' 3"  (1.905 m), weight 227 lb 8 oz (103.2 kg), SpO2 100 %.  LABORATORY DATA: Lab Results  Component Value Date   WBC 6.9 05/02/2020   HGB 9.2 (L) 05/02/2020   HCT 28.7 (L) 05/02/2020   MCV 98.0 05/02/2020   PLT 417 (H) 05/02/2020      Chemistry       Component Value Date/Time   NA 141 05/02/2020 0816   NA 140 06/08/2015 0840   K 3.7 05/02/2020 0816   CL 108 05/02/2020 0816   CO2 25 05/02/2020 0816   BUN 20 05/02/2020 0816   BUN 15 06/08/2015 0840   CREATININE 1.67 (H) 05/02/2020 0816   CREATININE 1.06 10/01/2016 0855      Component Value Date/Time   CALCIUM 9.5 05/02/2020 0816   ALKPHOS 69 05/02/2020 0816   AST 31 05/02/2020 0816   ALT 29 05/02/2020 0816   BILITOT <0.2 (L) 05/02/2020 0816       RADIOGRAPHIC STUDIES: No results found.  ASSESSMENT AND PLAN: This is a very pleasant 64 years old African-American male recently diagnosed with a stage IV (TX, N3, M1c) non-small cell lung cancer, poorly differentiated adenocarcinoma presented with bulky mediastinal as well as right hilar, supraclavicular, abdominal and right axillary lymphadenopathy in addition to metastatic liver lesions diagnosed in January 2021. Molecular studies by [employer] 360 shows no actionable mutations.  The patient is not a candidate for treatment with targeted therapy or enrollment in the clinical trial with the [facility] regimen. The patient is currently undergoing systemic chemotherapy with carboplatin for AUC of 5, Alimta 500 mg/M2 and Keytruda 200 mg IV every 3 weeks status post 14 cycles. Starting from cycle #5 he is treated with maintenance Alimta and Keytruda every 3 weeks.  Starting from cycle #12 the patient has been on treatment with single agent Keytruda because of the renal insufficiency. The patient has been tolerating his treatment with single agent Keytruda fairly well. I recommended for him to proceed with cycle #15 today as planned. I will see him back for follow-up visit in 3 weeks for evaluation with repeat CT scan of the chest, abdomen pelvis for restaging of his disease. For the anemia, I strongly advised him to resume his oral iron either as liquid or tablet if tolerated. He was advised to call immediately if he has any other concerning  symptoms in the interval. The patient voices understanding of current disease status and treatment options and is in agreement with the current care plan.  All questions were answered. The patient knows to call the clinic with any problems, questions or concerns. We can certainly see the patient much sooner if necessary.  Disclaimer: This note was dictated with voice recognition software. Similar sounding words can inadvertently be transcribed and may not be corrected upon review.

## 2020-05-02 NOTE — Progress Notes (Signed)
Per Dr [name] ,it is okay to treat pt today with Keytruda and alimta and creatinine of 1.67

## 2020-05-02 NOTE — Patient Instructions (Signed)
[facility] Discharge Instructions for Patients Receiving Chemotherapy  Today you received the following chemotherapy agents Pembrolizumab [facility]).  To help prevent nausea and vomiting after your treatment, we encourage you to take your nausea medication as prescribed.    If you develop nausea and vomiting that is not controlled by your nausea medication, call the clinic.   BELOW ARE SYMPTOMS THAT SHOULD BE REPORTED IMMEDIATELY:  *FEVER GREATER THAN 100.5 F  *CHILLS WITH OR WITHOUT FEVER  NAUSEA AND VOMITING THAT IS NOT CONTROLLED WITH YOUR NAUSEA MEDICATION  *UNUSUAL SHORTNESS OF BREATH  *UNUSUAL BRUISING OR BLEEDING  TENDERNESS IN MOUTH AND THROAT WITH OR WITHOUT PRESENCE OF ULCERS  *URINARY PROBLEMS  *BOWEL PROBLEMS  UNUSUAL RASH Items with * indicate a potential emergency and should be followed up as soon as possible.  Feel free to call the clinic should you have any questions or concerns. The clinic phone number is [phone].  Please show the [facility] at check-in to the Emergency Department and triage nurse.

## 2020-05-02 NOTE — Progress Notes (Signed)
No Alimta today per Dr. [name].  Keytruda only.  Kennith Center, Pharm.D., CPP 05/02/2020@10 :49 AM

## 2020-05-23 ENCOUNTER — Other Ambulatory Visit: Payer: Medicare Other

## 2020-05-23 ENCOUNTER — Other Ambulatory Visit: Payer: Self-pay

## 2020-05-23 ENCOUNTER — Ambulatory Visit (HOSPITAL_COMMUNITY)
Admission: RE | Admit: 2020-05-23 | Discharge: 2020-05-23 | Disposition: A | Discharge status: Home / Self Care | Payer: Medicare Other | Source: Ambulatory Visit | Attending: Internal Medicine | Admitting: Internal Medicine

## 2020-05-23 ENCOUNTER — Ambulatory Visit: Payer: Medicare Other

## 2020-05-23 ENCOUNTER — Encounter (HOSPITAL_COMMUNITY): Payer: Self-pay

## 2020-05-23 ENCOUNTER — Ambulatory Visit: Payer: Medicare Other | Admitting: Physician Assistant

## 2020-05-23 DIAGNOSIS — C3490 Malignant neoplasm of unspecified part of unspecified bronchus or lung: Secondary | ICD-10-CM | POA: Insufficient documentation

## 2020-05-24 ENCOUNTER — Inpatient Hospital Stay: Payer: Medicare Other

## 2020-05-24 ENCOUNTER — Inpatient Hospital Stay (HOSPITAL_BASED_OUTPATIENT_CLINIC_OR_DEPARTMENT_OTHER): Payer: Medicare Other | Admitting: Internal Medicine

## 2020-05-24 ENCOUNTER — Other Ambulatory Visit: Payer: Self-pay

## 2020-05-24 ENCOUNTER — Ambulatory Visit: Payer: Medicare Other | Admitting: Family Medicine

## 2020-05-24 VITALS — BP 151/63 | HR 98 | Temp 97.8°F | Resp 16 | Ht 75.0 in | Wt 229.9 lb

## 2020-05-24 DIAGNOSIS — I2510 Atherosclerotic heart disease of native coronary artery without angina pectoris: Secondary | ICD-10-CM | POA: Diagnosis not present

## 2020-05-24 DIAGNOSIS — C3490 Malignant neoplasm of unspecified part of unspecified bronchus or lung: Secondary | ICD-10-CM

## 2020-05-24 DIAGNOSIS — Z5112 Encounter for antineoplastic immunotherapy: Secondary | ICD-10-CM | POA: Diagnosis not present

## 2020-05-24 DIAGNOSIS — R5383 Other fatigue: Secondary | ICD-10-CM

## 2020-05-24 DIAGNOSIS — C787 Secondary malignant neoplasm of liver and intrahepatic bile duct: Secondary | ICD-10-CM

## 2020-05-24 DIAGNOSIS — C3491 Malignant neoplasm of unspecified part of right bronchus or lung: Secondary | ICD-10-CM

## 2020-05-24 LAB — CMP (CANCER CENTER ONLY)
ALT: 15 U/L (ref 0–44)
AST: 20 U/L (ref 15–41)
Albumin: 3.7 g/dL (ref 3.5–5.0)
Alkaline Phosphatase: 75 U/L (ref 38–126)
Anion gap: 7 (ref 5–15)
BUN: 19 mg/dL (ref 8–23)
CO2: 28 mmol/L (ref 22–32)
Calcium: 9.5 mg/dL (ref 8.9–10.3)
Chloride: 106 mmol/L (ref 98–111)
Creatinine: 1.99 mg/dL — ABNORMAL HIGH (ref 0.61–1.24)
GFR, Estimated: 36 mL/min — ABNORMAL LOW (ref >60)
Glucose, Bld: 220 mg/dL — ABNORMAL HIGH (ref 70–99)
Potassium: 3.9 mmol/L (ref 3.5–5.1)
Sodium: 141 mmol/L (ref 135–145)
Total Bilirubin: 0.3 mg/dL (ref 0.3–1.2)
Total Protein: 8.7 g/dL — ABNORMAL HIGH (ref 6.5–8.1)

## 2020-05-24 LAB — CBC WITH DIFFERENTIAL (CANCER CENTER ONLY)
Abs Immature Granulocytes: 0.02 10*3/uL (ref 0.00–0.07)
Basophils Absolute: 0.1 10*3/uL (ref 0.0–0.1)
Basophils Relative: 1 %
Eosinophils Absolute: 0.3 10*3/uL (ref 0.0–0.5)
Eosinophils Relative: 3 %
HCT: 31.5 % — ABNORMAL LOW (ref 39.0–52.0)
Hemoglobin: 10 g/dL — ABNORMAL LOW (ref 13.0–17.0)
Immature Granulocytes: 0 %
Lymphocytes Relative: 39 %
Lymphs Abs: 3.2 10*3/uL (ref 0.7–4.0)
MCH: 30.8 pg (ref 26.0–34.0)
MCHC: 31.7 g/dL (ref 30.0–36.0)
MCV: 96.9 fL (ref 80.0–100.0)
Monocytes Absolute: 1.1 10*3/uL — ABNORMAL HIGH (ref 0.1–1.0)
Monocytes Relative: 14 %
Neutro Abs: 3.6 10*3/uL (ref 1.7–7.7)
Neutrophils Relative %: 43 %
Platelet Count: 237 10*3/uL (ref 150–400)
RBC: 3.25 MIL/uL — ABNORMAL LOW (ref 4.22–5.81)
RDW: 15 % (ref 11.5–15.5)
WBC Count: 8.3 10*3/uL (ref 4.0–10.5)
nRBC: 0 % (ref 0.0–0.2)

## 2020-05-24 LAB — TSH: TSH: 0.484 u[IU]/mL (ref 0.320–4.118)

## 2020-05-24 LAB — TOTAL PROTEIN, URINE DIPSTICK: Protein, ur: 100 mg/dL — AB

## 2020-05-24 MED ORDER — SODIUM CHLORIDE 0.9 % IV SOLN
200.0000 mg | Freq: Once | INTRAVENOUS | Status: AC
  Administered 2020-05-24: 200 mg via INTRAVENOUS
  Filled 2020-05-24: qty 8

## 2020-05-24 MED ORDER — SODIUM CHLORIDE 0.9 % IV SOLN
Freq: Once | INTRAVENOUS | Status: AC
  Filled 2020-05-24: qty 250

## 2020-05-24 MED ORDER — SODIUM CHLORIDE 0.9% FLUSH
10.0000 mL | INTRAVENOUS | Status: DC | PRN
  Administered 2020-05-24: 10 mL
  Filled 2020-05-24: qty 10

## 2020-05-24 MED ORDER — HEPARIN SOD (PORK) LOCK FLUSH 100 UNIT/ML IV SOLN
500.0000 [IU] | Freq: Once | INTRAVENOUS | Status: AC | PRN
  Administered 2020-05-24: 500 [IU]
  Filled 2020-05-24: qty 5

## 2020-05-24 NOTE — Progress Notes (Signed)
[facility] [phone]   [phone]  OFFICE PROGRESS NOTE  [name], FNP [name] [address]  DIAGNOSIS: Stage IV (TX, N3, M1c) non-small cell lung cancer, poorly differentiated adenocarcinoma presented with bulky mediastinal lymphadenopathy in addition to right axillary lymphadenopathy and metastatic liver lesions, abdominal lymphadenopathy diagnosed in January 2021. Molecular studies by [employer] that shows no actionable mutations.  PRIOR THERAPY: None  CURRENT THERAPY: Systemic chemotherapy with carboplatin for AUC of 5, Alimta 500 mg/M2 and Keytruda 200 mg IV every 3 weeks.  First dose July 14, 2019.  Status post 15 cycles. Starting from cycle #5 the patient will be treated with maintenance Alimta and Keytruda every 3 weeks.  Starting from cycle #12 he will be on single agent treatment with Keytruda secondary to renal insufficiency.  INTERVAL HISTORY: Kirk [name] 64 y.o. male returns to the clinic today for follow-up visit.  The patient is feeling fine today with no concerning complaints except for the baseline fatigue.  He denied having any chest pain, shortness of breath, cough or hemoptysis.  He denied having any fever or chills.  He has no nausea, vomiting, diarrhea or constipation.  He has no significant weight loss or night sweats.  He has no headache or visual changes.  He has been tolerating his treatment with maintenance Keytruda fairly well the patient is here today for evaluation with repeat CT scan of the chest, abdomen pelvis for restaging of his disease before starting cycle #16.   MEDICAL HISTORY: Past Medical History:  Diagnosis Date  . Arthritis    "right knee" (09/04/2015)  . Atrial flutter with rapid ventricular response ([facility]) 04/19/2019  . Dizziness 10/2019  . Dyspnea    increased exertion  . HTN (hypertension)   . Hyperlipidemia   . Lung cancer ([facility]) 05/2019  . NSTEMI (non-ST  elevated myocardial infarction) ([facility]) 09/04/2015   a. cath 09/05/2015: 95% stenosis mid-LAD, 55% mid-RCA, and 40% prox-RCA. PCI performed w/ Synergy DES to LAD  . PAD (peripheral artery disease) ([facility])    Stenting of bilateral iliacs in 2003.  [name] Refusal of blood transfusions as patient is Jehovah's Witness   . Type II diabetes mellitus ([facility])     ALLERGIES:  has No Known Allergies.  MEDICATIONS:  Current Outpatient Medications  Medication Sig Dispense Refill  . acetaminophen (TYLENOL) 500 MG tablet Take 500 mg by mouth every 6 (six) hours as needed for moderate pain or fever.     [name] aspirin EC 81 MG tablet Take 81 mg by mouth at bedtime.    . carvedilol (COREG) 12.5 MG tablet Take 1 tablet (12.5 mg total) by mouth 2 (two) times daily with a meal. 180 tablet 3  . cetirizine (ZYRTEC) 10 MG tablet Take 1 tablet (10 mg total) by mouth daily. 90 tablet 3  . clopidogrel (PLAVIX) 75 MG tablet Take 1 tablet (75 mg total) by mouth daily. 90 tablet 3  . docusate sodium (COLACE) 100 MG capsule Take 1 capsule (100 mg total) by mouth 2 (two) times daily as needed for mild constipation. 60 capsule 11  . ferrous sulfate 324 MG TBEC Take 1 tablet (324 mg total) by mouth daily with breakfast. 30 tablet 11  . folic acid (FOLVITE) 1 MG tablet Take 1 tablet (1 mg total) by mouth daily. 90 tablet 3  . furosemide (LASIX) 20 MG tablet Take 1 tablet (20 mg total) by mouth 2 (two) times daily.  60 tablet 1  . gabapentin (NEURONTIN) 300 MG capsule Take 1 capsule (300 mg total) by mouth 3 (three) times daily. 90 capsule 6  . glimepiride (AMARYL) 4 MG tablet Take 1 tablet (4 mg total) by mouth daily. 90 tablet 3  . Hydrocortisone Acetate 1 % CREA Apply 15 g topically 2 (two) times daily. 15 g 3  . ipratropium (ATROVENT) 0.03 % nasal spray USE 2 SPRAYS IN EACH NOSTRIL TWICE DAILY (Patient taking differently: Place 1 spray into both nostrils daily as needed (allergies). ) 30 mL 2  . lactulose (CHRONULAC) 10 GM/15ML solution  Take 30 mLs (20 g total) by mouth 2 (two) times daily as needed for moderate constipation. 240 mL 11  . lidocaine-prilocaine (EMLA) cream APPLY TO THE PORT-A-CATH SITE 30-60 MINUTES BEFORE TREATMENT. (Patient taking differently: Apply 1 application topically daily as needed (access port). Apply to the Port-A-Cath site 30-60 minutes before treatment.) 30 g 0  . meclizine (ANTIVERT) 25 MG tablet Take 1 tablet (25 mg total) by mouth 3 (three) times daily as needed for dizziness. (Patient not taking: Reported on 04/05/2020) 90 tablet 3  . metFORMIN (GLUCOPHAGE) 1000 MG tablet Take 1 tablet (1,000 mg total) by mouth 2 (two) times daily with a meal. 180 tablet 3  . multivitamin (ONE-A-DAY MEN'S) TABS tablet Take 1 tablet by mouth daily. 30 tablet 6  . nitroGLYCERIN (NITROSTAT) 0.4 MG SL tablet Place 1 tablet (0.4 mg total) under the tongue every 5 (five) minutes x 3 doses as needed for chest pain. (Patient not taking: Reported on 04/05/2020) 25 tablet 2  . oxyCODONE-acetaminophen (PERCOCET/ROXICET) 5-325 MG tablet Take 1 tablet by mouth every 6 (six) hours as needed for severe pain. 20 tablet 0  . pantoprazole (PROTONIX) 40 MG tablet Take 1 tablet (40 mg total) by mouth daily. (Patient not taking: Reported on 04/05/2020) 90 tablet 3  . prochlorperazine (COMPAZINE) 10 MG tablet Take 1 tablet (10 mg total) by mouth every 6 (six) hours as needed for nausea or vomiting. (Patient not taking: Reported on 04/05/2020) 30 tablet 11  . rosuvastatin (CRESTOR) 40 MG tablet Take 1 tablet (40 mg total) by mouth daily. 90 tablet 3  . sucralfate (CARAFATE) 1 GM/10ML suspension Take 10 mLs (1 g total) by mouth 4 (four) times daily. (Patient not taking: Reported on 04/05/2020) 420 mL 1   No current facility-administered medications for this visit.    SURGICAL HISTORY:  Past Surgical History:  Procedure Laterality Date  . CARDIAC CATHETERIZATION N/A 09/05/2015   Procedure: Left Heart Cath and Coronary Angiography;   Surgeon: Troy Sine, MD;  Location: [facility];  Service: Cardiovascular;  Laterality: N/A;  . CARDIAC CATHETERIZATION N/A 09/05/2015   Procedure: Coronary Stent Intervention;  Surgeon: Troy Sine, MD;  Location: [facility];  Service: Cardiovascular;  Laterality: N/A;  . IR IMAGING GUIDED PORT INSERTION  07/15/2019  . 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 PERIPHERAL VASCULAR CATHETERIZATION Bilateral 2003   "stenting"  . TONSILLECTOMY  ~ 1963    REVIEW OF SYSTEMS:  Constitutional: positive for fatigue Eyes: negative Ears, nose, mouth, throat, and face: negative Respiratory: negative Cardiovascular: negative Gastrointestinal: negative Genitourinary:negative Integument/breast: negative Hematologic/lymphatic: negative Musculoskeletal:negative Neurological: negative Behavioral/Psych: negative Endocrine: negative Allergic/Immunologic: negative   PHYSICAL EXAMINATION: General appearance: alert, cooperative, appears stated age, fatigued and no distress Head: Normocephalic, without obvious abnormality, atraumatic Neck: no adenopathy, no JVD, supple, symmetrical, trachea midline and thyroid  not enlarged, symmetric, no tenderness/mass/nodules Lymph nodes: Cervical, supraclavicular, and axillary nodes normal. Resp: clear to auscultation bilaterally Back: symmetric, no curvature. ROM normal. No CVA tenderness. Cardio: regular rate and rhythm, S1, S2 normal, no murmur, click, rub or gallop GI: soft, non-tender; bowel sounds normal; no masses,  no organomegaly Extremities: extremities normal, atraumatic, no cyanosis or edema Neurologic: Alert and oriented X 3, normal strength and tone. Normal symmetric reflexes. Normal coordination and gait  ECOG PERFORMANCE STATUS: 1 - Symptomatic but completely ambulatory  Blood pressure (!) 151/63, pulse 98, temperature 97.8 F (36.6 C), temperature source Tympanic, resp. rate 16, height 6\' 3"  (1.905 m),  weight 229 lb 14.4 oz (104.3 kg), SpO2 98 %.  LABORATORY DATA: Lab Results  Component Value Date   WBC 8.3 05/24/2020   HGB 10.0 (L) 05/24/2020   HCT 31.5 (L) 05/24/2020   MCV 96.9 05/24/2020   PLT 237 05/24/2020      Chemistry      Component Value Date/Time   NA 141 05/24/2020 1100   NA 140 06/08/2015 0840   K 3.9 05/24/2020 1100   CL 106 05/24/2020 1100   CO2 28 05/24/2020 1100   BUN 19 05/24/2020 1100   BUN 15 06/08/2015 0840   CREATININE 1.99 (H) 05/24/2020 1100   CREATININE 1.06 10/01/2016 0855      Component Value Date/Time   CALCIUM 9.5 05/24/2020 1100   ALKPHOS 75 05/24/2020 1100   AST 20 05/24/2020 1100   ALT 15 05/24/2020 1100   BILITOT 0.3 05/24/2020 1100       RADIOGRAPHIC STUDIES: CT Abdomen Pelvis Wo Contrast  Result Date: 05/23/2020 CLINICAL DATA:  Non-small cell lung cancer.  Staging. EXAM: CT CHEST, ABDOMEN AND PELVIS WITHOUT CONTRAST TECHNIQUE: Multidetector CT imaging of the chest, abdomen and pelvis was performed following the standard protocol without IV contrast. COMPARISON:  03/16/2020 FINDINGS: CT CHEST FINDINGS Cardiovascular: The heart size is normal. Trace pericardial effusion versus thickening, unchanged. Aortic atherosclerosis. Coronary artery calcifications. Mediastinum/Nodes: Index subcarinal lymph node measures 1.2 cm, image 32/2. Previously 1.3 cm (when remeasured). Index lower thoracic aorto-esophageal lymph node measures 1 cm, image 48/2. Unchanged. Low right paratracheal node measures 0.9 cm, image 23/2.  Unchanged. Lungs/Pleura: No pleural effusion. Moderate centrilobular and paraseptal emphysema. Unchanged scar like density in the right middle lobe, image 89/6. Previous subpleural nodule in the right base is resolved. Musculoskeletal: Thoracic degenerative disc disease. CT ABDOMEN PELVIS FINDINGS Hepatobiliary: Previously characterized benign hemangioma within segment 5 the is unchanged measuring 2 cm. Within the limitations of unenhanced  technique there are no additional liver abnormalities. 1 gallbladder is normal. No biliary dilatation. Pancreas: Unremarkable. No pancreatic ductal dilatation or surrounding inflammatory changes. Spleen: Normal in size without focal abnormality. Adrenals/Urinary Tract: Normal appearance of the adrenal glands. The kidneys are unremarkable. No mass, calcification or hydronephrosis identified. Urinary bladder is unremarkable. Stomach/Bowel: Stomach is normal. The appendix is visualized and is unremarkable. No bowel wall thickening, inflammation, or distension. Vascular/Lymphatic: Aortic atherosclerosis.  No aneurysm. Index gastrohepatic ligament node measures 0.9 cm, image 60/2. Previously 1.1 cm. Gastrohepatic ligament lymph node measures 1.5 cm, image 60/2. Index left external iliac node measures 0.9 cm, image 113/2. Previously 0.8 cm. Reproductive: Prostate is unremarkable. Other: No free fluid or fluid collections. Musculoskeletal: Previous left hip pinning. Degenerative disc disease noted. No acute or suspicious osseous findings. IMPRESSION: 1. Stable exam. Borderline enlarged upper abdominal and left external iliac lymph nodes are stable in size in the interval. No new or progressive disease  identified. 2. Emphysema and aortic atherosclerosis. 3. Coronary artery calcifications. 4. Stable benign hemangioma within segment 5 of the liver. Aortic Atherosclerosis (ICD10-I70.0) and Emphysema (ICD10-J43.9). Electronically Signed   By: Kerby Moors M.D.   On: 05/23/2020 09:17   CT Chest Wo Contrast  Result Date: 05/23/2020 CLINICAL DATA:  Non-small cell lung cancer.  Staging. EXAM: CT CHEST, ABDOMEN AND PELVIS WITHOUT CONTRAST TECHNIQUE: Multidetector CT imaging of the chest, abdomen and pelvis was performed following the standard protocol without IV contrast. COMPARISON:  03/16/2020 FINDINGS: CT CHEST FINDINGS Cardiovascular: The heart size is normal. Trace pericardial effusion versus thickening, unchanged.  Aortic atherosclerosis. Coronary artery calcifications. Mediastinum/Nodes: Index subcarinal lymph node measures 1.2 cm, image 32/2. Previously 1.3 cm (when remeasured). Index lower thoracic aorto-esophageal lymph node measures 1 cm, image 48/2. Unchanged. Low right paratracheal node measures 0.9 cm, image 23/2.  Unchanged. Lungs/Pleura: No pleural effusion. Moderate centrilobular and paraseptal emphysema. Unchanged scar like density in the right middle lobe, image 89/6. Previous subpleural nodule in the right base is resolved. Musculoskeletal: Thoracic degenerative disc disease. CT ABDOMEN PELVIS FINDINGS Hepatobiliary: Previously characterized benign hemangioma within segment 5 the is unchanged measuring 2 cm. Within the limitations of unenhanced technique there are no additional liver abnormalities. 1 gallbladder is normal. No biliary dilatation. Pancreas: Unremarkable. No pancreatic ductal dilatation or surrounding inflammatory changes. Spleen: Normal in size without focal abnormality. Adrenals/Urinary Tract: Normal appearance of the adrenal glands. The kidneys are unremarkable. No mass, calcification or hydronephrosis identified. Urinary bladder is unremarkable. Stomach/Bowel: Stomach is normal. The appendix is visualized and is unremarkable. No bowel wall thickening, inflammation, or distension. Vascular/Lymphatic: Aortic atherosclerosis.  No aneurysm. Index gastrohepatic ligament node measures 0.9 cm, image 60/2. Previously 1.1 cm. Gastrohepatic ligament lymph node measures 1.5 cm, image 60/2. Index left external iliac node measures 0.9 cm, image 113/2. Previously 0.8 cm. Reproductive: Prostate is unremarkable. Other: No free fluid or fluid collections. Musculoskeletal: Previous left hip pinning. Degenerative disc disease noted. No acute or suspicious osseous findings. IMPRESSION: 1. Stable exam. Borderline enlarged upper abdominal and left external iliac lymph nodes are stable in size in the interval. No new  or progressive disease identified. 2. Emphysema and aortic atherosclerosis. 3. Coronary artery calcifications. 4. Stable benign hemangioma within segment 5 of the liver. Aortic Atherosclerosis (ICD10-I70.0) and Emphysema (ICD10-J43.9). Electronically Signed   By: Kerby Moors M.D.   On: 05/23/2020 09:17    ASSESSMENT AND PLAN: This is a very pleasant 64 years old African-American male recently diagnosed with a stage IV (TX, N3, M1c) non-small cell lung cancer, poorly differentiated adenocarcinoma presented with bulky mediastinal as well as right hilar, supraclavicular, abdominal and right axillary lymphadenopathy in addition to metastatic liver lesions diagnosed in January 2021. Molecular studies by guardant 360 shows no actionable mutations.  The patient is not a candidate for treatment with targeted therapy or enrollment in the clinical trial with the [facility] regimen. The patient is currently undergoing systemic chemotherapy with carboplatin for AUC of 5, Alimta 500 mg/M2 and Keytruda 200 mg IV every 3 weeks status post 15 cycles. Starting from cycle #5 he is treated with maintenance Alimta and Keytruda every 3 weeks.  Starting from cycle #12 the patient has been on treatment with single agent Keytruda because of the renal insufficiency. The patient has been tolerating his treatment with Keytruda fairly well with no concerning adverse effects. He had repeat CT scan of the chest, abdomen pelvis performed recently.  I personally and independently reviewed the scan images and discussed  the result and showed the images to the patient today. His scan showed no concerning findings for disease progression. I recommended for him to continue his current maintenance treatment with Keytruda every 3 weeks for a total of 2 years as long as the patient does not have any disease progression or unacceptable toxicity. For the anemia he was a started on oral iron tablet but discontinued it because of constipation.  His  hemoglobin and hematocrit are acceptable today. The patient was advised to call immediately if he has any other concerning symptoms in the interval.  The patient voices understanding of current disease status and treatment options and is in agreement with the current care plan.  All questions were answered. The patient knows to call the clinic with any problems, questions or concerns. We can certainly see the patient much sooner if necessary.  Disclaimer: This note was dictated with voice recognition software. Similar sounding words can inadvertently be transcribed and may not be corrected upon review.

## 2020-05-24 NOTE — Progress Notes (Signed)
Okay to treat with elevated creat of 1.99 per Dr. [name]. Also per Dr. [name] will not administer the compazine with the [facility]

## 2020-05-25 ENCOUNTER — Telehealth: Payer: Self-pay | Admitting: Internal Medicine

## 2020-05-25 NOTE — Telephone Encounter (Signed)
Scheduled 2 rounds of infusions per 12/29 los. Patient is aware.

## 2020-06-13 ENCOUNTER — Other Ambulatory Visit: Payer: Self-pay | Admitting: Family Medicine

## 2020-06-13 DIAGNOSIS — T451X5A Adverse effect of antineoplastic and immunosuppressive drugs, initial encounter: Secondary | ICD-10-CM

## 2020-06-13 DIAGNOSIS — G620 Drug-induced polyneuropathy: Secondary | ICD-10-CM

## 2020-06-14 ENCOUNTER — Other Ambulatory Visit: Payer: Self-pay

## 2020-06-14 ENCOUNTER — Inpatient Hospital Stay: Payer: Medicare Other | Attending: Internal Medicine | Admitting: Internal Medicine

## 2020-06-14 ENCOUNTER — Inpatient Hospital Stay: Payer: Medicare Other

## 2020-06-14 ENCOUNTER — Encounter: Payer: Self-pay | Admitting: Internal Medicine

## 2020-06-14 VITALS — BP 157/67 | HR 88 | Temp 97.1°F | Resp 18 | Ht 75.0 in | Wt 231.0 lb

## 2020-06-14 DIAGNOSIS — D649 Anemia, unspecified: Secondary | ICD-10-CM | POA: Diagnosis not present

## 2020-06-14 DIAGNOSIS — Z7984 Long term (current) use of oral hypoglycemic drugs: Secondary | ICD-10-CM | POA: Insufficient documentation

## 2020-06-14 DIAGNOSIS — C3490 Malignant neoplasm of unspecified part of unspecified bronchus or lung: Secondary | ICD-10-CM

## 2020-06-14 DIAGNOSIS — I252 Old myocardial infarction: Secondary | ICD-10-CM | POA: Diagnosis not present

## 2020-06-14 DIAGNOSIS — I10 Essential (primary) hypertension: Secondary | ICD-10-CM | POA: Insufficient documentation

## 2020-06-14 DIAGNOSIS — N289 Disorder of kidney and ureter, unspecified: Secondary | ICD-10-CM | POA: Insufficient documentation

## 2020-06-14 DIAGNOSIS — E785 Hyperlipidemia, unspecified: Secondary | ICD-10-CM | POA: Diagnosis not present

## 2020-06-14 DIAGNOSIS — Z95828 Presence of other vascular implants and grafts: Secondary | ICD-10-CM

## 2020-06-14 DIAGNOSIS — C787 Secondary malignant neoplasm of liver and intrahepatic bile duct: Secondary | ICD-10-CM | POA: Diagnosis not present

## 2020-06-14 DIAGNOSIS — Z7982 Long term (current) use of aspirin: Secondary | ICD-10-CM | POA: Insufficient documentation

## 2020-06-14 DIAGNOSIS — R5383 Other fatigue: Secondary | ICD-10-CM

## 2020-06-14 DIAGNOSIS — E1151 Type 2 diabetes mellitus with diabetic peripheral angiopathy without gangrene: Secondary | ICD-10-CM | POA: Diagnosis not present

## 2020-06-14 DIAGNOSIS — Z79899 Other long term (current) drug therapy: Secondary | ICD-10-CM | POA: Insufficient documentation

## 2020-06-14 DIAGNOSIS — C3491 Malignant neoplasm of unspecified part of right bronchus or lung: Secondary | ICD-10-CM | POA: Insufficient documentation

## 2020-06-14 DIAGNOSIS — Z5112 Encounter for antineoplastic immunotherapy: Secondary | ICD-10-CM | POA: Diagnosis present

## 2020-06-14 LAB — CBC WITH DIFFERENTIAL (CANCER CENTER ONLY)
Abs Immature Granulocytes: 0.01 10*3/uL (ref 0.00–0.07)
Basophils Absolute: 0.1 10*3/uL (ref 0.0–0.1)
Basophils Relative: 1 %
Eosinophils Absolute: 0.3 10*3/uL (ref 0.0–0.5)
Eosinophils Relative: 5 %
HCT: 33.6 % — ABNORMAL LOW (ref 39.0–52.0)
Hemoglobin: 10.6 g/dL — ABNORMAL LOW (ref 13.0–17.0)
Immature Granulocytes: 0 %
Lymphocytes Relative: 47 %
Lymphs Abs: 3.1 10*3/uL (ref 0.7–4.0)
MCH: 29.9 pg (ref 26.0–34.0)
MCHC: 31.5 g/dL (ref 30.0–36.0)
MCV: 94.6 fL (ref 80.0–100.0)
Monocytes Absolute: 0.8 10*3/uL (ref 0.1–1.0)
Monocytes Relative: 12 %
Neutro Abs: 2.3 10*3/uL (ref 1.7–7.7)
Neutrophils Relative %: 35 %
Platelet Count: 219 10*3/uL (ref 150–400)
RBC: 3.55 MIL/uL — ABNORMAL LOW (ref 4.22–5.81)
RDW: 14.2 % (ref 11.5–15.5)
WBC Count: 6.6 10*3/uL (ref 4.0–10.5)
nRBC: 0 % (ref 0.0–0.2)

## 2020-06-14 LAB — CMP (CANCER CENTER ONLY)
ALT: 13 U/L (ref 0–44)
AST: 21 U/L (ref 15–41)
Albumin: 3.8 g/dL (ref 3.5–5.0)
Alkaline Phosphatase: 70 U/L (ref 38–126)
Anion gap: 10 (ref 5–15)
BUN: 21 mg/dL (ref 8–23)
CO2: 23 mmol/L (ref 22–32)
Calcium: 9.5 mg/dL (ref 8.9–10.3)
Chloride: 106 mmol/L (ref 98–111)
Creatinine: 1.9 mg/dL — ABNORMAL HIGH (ref 0.61–1.24)
GFR, Estimated: 38 mL/min — ABNORMAL LOW (ref >60)
Glucose, Bld: 233 mg/dL — ABNORMAL HIGH (ref 70–99)
Potassium: 4.1 mmol/L (ref 3.5–5.1)
Sodium: 139 mmol/L (ref 135–145)
Total Bilirubin: 0.3 mg/dL (ref 0.3–1.2)
Total Protein: 8.6 g/dL — ABNORMAL HIGH (ref 6.5–8.1)

## 2020-06-14 LAB — TSH: TSH: 1.016 u[IU]/mL (ref 0.320–4.118)

## 2020-06-14 LAB — TOTAL PROTEIN, URINE DIPSTICK: Protein, ur: 30 mg/dL — AB

## 2020-06-14 MED ORDER — SODIUM CHLORIDE 0.9% FLUSH
10.0000 mL | Freq: Once | INTRAVENOUS | Status: AC
  Administered 2020-06-14: 10 mL
  Filled 2020-06-14: qty 10

## 2020-06-14 MED ORDER — SODIUM CHLORIDE 0.9 % IV SOLN
Freq: Once | INTRAVENOUS | Status: AC
  Filled 2020-06-14: qty 250

## 2020-06-14 MED ORDER — PEMBROLIZUMAB CHEMO INJECTION 100 MG/4ML
200.0000 mg | Freq: Once | INTRAVENOUS | Status: AC
  Administered 2020-06-14: 200 mg via INTRAVENOUS
  Filled 2020-06-14: qty 8

## 2020-06-14 MED ORDER — SODIUM CHLORIDE 0.9% FLUSH
10.0000 mL | INTRAVENOUS | Status: DC | PRN
  Administered 2020-06-14: 10 mL
  Filled 2020-06-14: qty 10

## 2020-06-14 MED ORDER — HEPARIN SOD (PORK) LOCK FLUSH 100 UNIT/ML IV SOLN
500.0000 [IU] | Freq: Once | INTRAVENOUS | Status: AC | PRN
  Administered 2020-06-14: 500 [IU]
  Filled 2020-06-14: qty 5

## 2020-06-14 NOTE — Progress Notes (Signed)
[place] [phone]   [phone]  OFFICE PROGRESS NOTE  [name], FNP [name] [address]  DIAGNOSIS: Stage IV (TX, N3, M1c) non-small cell lung cancer, poorly differentiated adenocarcinoma presented with bulky mediastinal lymphadenopathy in addition to right axillary lymphadenopathy and metastatic liver lesions, abdominal lymphadenopathy diagnosed in January 2021. Molecular studies by [employer] 360 that shows no actionable mutations.  PRIOR THERAPY: None  CURRENT THERAPY: Systemic chemotherapy with carboplatin for AUC of 5, Alimta 500 mg/M2 and Keytruda 200 mg IV every 3 weeks.  First dose July 14, 2019.  Status post 16 cycles. Starting from cycle #5 the patient will be treated with maintenance Alimta and Keytruda every 3 weeks.  Starting from cycle #12 he will be on single agent treatment with Keytruda secondary to renal insufficiency.  INTERVAL HISTORY: Kirk [name] 65 y.o. male returns to the clinic today for follow-up visit.  The patient is feeling fine today with no concerning complaints except for occasional pain in the right flank area.  He denied having any history of kidney stone or dysuria.  He denied having any chest pain, shortness of breath, cough or hemoptysis.  He has no nausea, vomiting, diarrhea or constipation.  He has no headache or visual changes.  He continues to tolerate his systemic treatment with Keytruda fairly well.  The patient is here today for evaluation before starting cycle #17.  MEDICAL HISTORY: Past Medical History:  Diagnosis Date  . Arthritis    "right knee" (09/04/2015)  . Atrial flutter with rapid ventricular response ([place]) 04/19/2019  . Dizziness 10/2019  . Dyspnea    increased exertion  . HTN (hypertension)   . Hyperlipidemia   . Lung cancer ([place]) 05/2019  . NSTEMI (non-ST elevated myocardial infarction) ([place]) 09/04/2015   a. cath 09/05/2015: 95% stenosis mid-LAD, 55%  mid-RCA, and 40% prox-RCA. PCI performed w/ Synergy DES to LAD  . PAD (peripheral artery disease) ([facility])    Stenting of bilateral iliacs in 2003.  [name] Refusal of blood transfusions as patient is Jehovah's Witness   . Type II diabetes mellitus ([facility])     ALLERGIES:  has No Known Allergies.  MEDICATIONS:  Current Outpatient Medications  Medication Sig Dispense Refill  . acetaminophen (TYLENOL) 500 MG tablet Take 500 mg by mouth every 6 (six) hours as needed for moderate pain or fever.     [name] aspirin EC 81 MG tablet Take 81 mg by mouth at bedtime.    . carvedilol (COREG) 12.5 MG tablet Take 1 tablet (12.5 mg total) by mouth 2 (two) times daily with a meal. 180 tablet 3  . cetirizine (ZYRTEC) 10 MG tablet Take 1 tablet (10 mg total) by mouth daily. 90 tablet 3  . clopidogrel (PLAVIX) 75 MG tablet Take 1 tablet (75 mg total) by mouth daily. 90 tablet 3  . docusate sodium (COLACE) 100 MG capsule Take 1 capsule (100 mg total) by mouth 2 (two) times daily as needed for mild constipation. 60 capsule 11  . ferrous sulfate 324 MG TBEC Take 1 tablet (324 mg total) by mouth daily with breakfast. 30 tablet 11  . folic acid (FOLVITE) 1 MG tablet Take 1 tablet (1 mg total) by mouth daily. 90 tablet 3  . furosemide (LASIX) 20 MG tablet Take 1 tablet (20 mg total) by mouth 2 (two) times daily. 60 tablet 1  . gabapentin (NEURONTIN) 300 MG capsule Take 1 capsule (300 mg total) by  mouth 3 (three) times daily. 90 capsule 6  . glimepiride (AMARYL) 4 MG tablet Take 1 tablet (4 mg total) by mouth daily. 90 tablet 3  . Hydrocortisone Acetate 1 % CREA Apply 15 g topically 2 (two) times daily. 15 g 3  . ipratropium (ATROVENT) 0.03 % nasal spray USE 2 SPRAYS IN EACH NOSTRIL TWICE DAILY (Patient taking differently: Place 1 spray into both nostrils daily as needed (allergies). ) 30 mL 2  . lactulose (CHRONULAC) 10 GM/15ML solution Take 30 mLs (20 g total) by mouth 2 (two) times daily as needed for moderate constipation. 240 mL  11  . lidocaine-prilocaine (EMLA) cream APPLY TO THE PORT-A-CATH SITE 30-60 MINUTES BEFORE TREATMENT. (Patient taking differently: Apply 1 application topically daily as needed (access port). Apply to the Port-A-Cath site 30-60 minutes before treatment.) 30 g 0  . meclizine (ANTIVERT) 25 MG tablet Take 1 tablet (25 mg total) by mouth 3 (three) times daily as needed for dizziness. (Patient not taking: Reported on 04/05/2020) 90 tablet 3  . metFORMIN (GLUCOPHAGE) 1000 MG tablet Take 1 tablet (1,000 mg total) by mouth 2 (two) times daily with a meal. 180 tablet 3  . multivitamin (ONE-A-DAY MEN'S) TABS tablet Take 1 tablet by mouth daily. 30 tablet 6  . nitroGLYCERIN (NITROSTAT) 0.4 MG SL tablet Place 1 tablet (0.4 mg total) under the tongue every 5 (five) minutes x 3 doses as needed for chest pain. (Patient not taking: Reported on 04/05/2020) 25 tablet 2  . oxyCODONE-acetaminophen (PERCOCET/ROXICET) 5-325 MG tablet Take 1 tablet by mouth every 6 (six) hours as needed for severe pain. 20 tablet 0  . pantoprazole (PROTONIX) 40 MG tablet Take 1 tablet (40 mg total) by mouth daily. (Patient not taking: Reported on 04/05/2020) 90 tablet 3  . prochlorperazine (COMPAZINE) 10 MG tablet Take 1 tablet (10 mg total) by mouth every 6 (six) hours as needed for nausea or vomiting. (Patient not taking: Reported on 04/05/2020) 30 tablet 11  . rosuvastatin (CRESTOR) 40 MG tablet Take 1 tablet (40 mg total) by mouth daily. 90 tablet 3  . sucralfate (CARAFATE) 1 GM/10ML suspension Take 10 mLs (1 g total) by mouth 4 (four) times daily. (Patient not taking: Reported on 04/05/2020) 420 mL 1   No current facility-administered medications for this visit.    SURGICAL HISTORY:  Past Surgical History:  Procedure Laterality Date  . CARDIAC CATHETERIZATION N/A 09/05/2015   Procedure: Left Heart Cath and Coronary Angiography;  Surgeon: Troy Sine, MD;  Location: [facility];  Service: Cardiovascular;  Laterality: N/A;   . CARDIAC CATHETERIZATION N/A 09/05/2015   Procedure: Coronary Stent Intervention;  Surgeon: Troy Sine, MD;  Location: [facility];  Service: Cardiovascular;  Laterality: N/A;  . IR IMAGING GUIDED PORT INSERTION  07/15/2019  . 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 PERIPHERAL VASCULAR CATHETERIZATION Bilateral 2003   "stenting"  . TONSILLECTOMY  ~ 1963    REVIEW OF SYSTEMS:  A comprehensive review of systems was negative except for: Musculoskeletal: positive for back pain   PHYSICAL EXAMINATION: General appearance: alert, cooperative, appears stated age and no distress Head: Normocephalic, without obvious abnormality, atraumatic Neck: no adenopathy, no JVD, supple, symmetrical, trachea midline and thyroid not enlarged, symmetric, no tenderness/mass/nodules Lymph nodes: Cervical, supraclavicular, and axillary nodes normal. Resp: clear to auscultation bilaterally Back: symmetric, no curvature. ROM normal. No CVA tenderness. Cardio: regular rate and rhythm, S1, S2 normal, no murmur,  click, rub or gallop GI: soft, non-tender; bowel sounds normal; no masses,  no organomegaly Extremities: extremities normal, atraumatic, no cyanosis or edema  ECOG PERFORMANCE STATUS: 1 - Symptomatic but completely ambulatory  Blood pressure (!) 157/67, pulse 88, temperature (!) 97.1 F (36.2 C), temperature source Tympanic, resp. rate 18, height 6\' 3"  (1.905 m), weight 231 lb (104.8 kg), SpO2 100 %.  LABORATORY DATA: Lab Results  Component Value Date   WBC 6.6 06/14/2020   HGB 10.6 (L) 06/14/2020   HCT 33.6 (L) 06/14/2020   MCV 94.6 06/14/2020   PLT 219 06/14/2020      Chemistry      Component Value Date/Time   NA 141 05/24/2020 1100   NA 140 06/08/2015 0840   K 3.9 05/24/2020 1100   CL 106 05/24/2020 1100   CO2 28 05/24/2020 1100   BUN 19 05/24/2020 1100   BUN 15 06/08/2015 0840   CREATININE 1.99 (H) 05/24/2020 1100   CREATININE 1.06 10/01/2016 0855       Component Value Date/Time   CALCIUM 9.5 05/24/2020 1100   ALKPHOS 75 05/24/2020 1100   AST 20 05/24/2020 1100   ALT 15 05/24/2020 1100   BILITOT 0.3 05/24/2020 1100       RADIOGRAPHIC STUDIES: CT Abdomen Pelvis Wo Contrast  Result Date: 05/23/2020 CLINICAL DATA:  Non-small cell lung cancer.  Staging. EXAM: CT CHEST, ABDOMEN AND PELVIS WITHOUT CONTRAST TECHNIQUE: Multidetector CT imaging of the chest, abdomen and pelvis was performed following the standard protocol without IV contrast. COMPARISON:  03/16/2020 FINDINGS: CT CHEST FINDINGS Cardiovascular: The heart size is normal. Trace pericardial effusion versus thickening, unchanged. Aortic atherosclerosis. Coronary artery calcifications. Mediastinum/Nodes: Index subcarinal lymph node measures 1.2 cm, image 32/2. Previously 1.3 cm (when remeasured). Index lower thoracic aorto-esophageal lymph node measures 1 cm, image 48/2. Unchanged. Low right paratracheal node measures 0.9 cm, image 23/2.  Unchanged. Lungs/Pleura: No pleural effusion. Moderate centrilobular and paraseptal emphysema. Unchanged scar like density in the right middle lobe, image 89/6. Previous subpleural nodule in the right base is resolved. Musculoskeletal: Thoracic degenerative disc disease. CT ABDOMEN PELVIS FINDINGS Hepatobiliary: Previously characterized benign hemangioma within segment 5 the is unchanged measuring 2 cm. Within the limitations of unenhanced technique there are no additional liver abnormalities. 1 gallbladder is normal. No biliary dilatation. Pancreas: Unremarkable. No pancreatic ductal dilatation or surrounding inflammatory changes. Spleen: Normal in size without focal abnormality. Adrenals/Urinary Tract: Normal appearance of the adrenal glands. The kidneys are unremarkable. No mass, calcification or hydronephrosis identified. Urinary bladder is unremarkable. Stomach/Bowel: Stomach is normal. The appendix is visualized and is unremarkable. No bowel wall  thickening, inflammation, or distension. Vascular/Lymphatic: Aortic atherosclerosis.  No aneurysm. Index gastrohepatic ligament node measures 0.9 cm, image 60/2. Previously 1.1 cm. Gastrohepatic ligament lymph node measures 1.5 cm, image 60/2. Index left external iliac node measures 0.9 cm, image 113/2. Previously 0.8 cm. Reproductive: Prostate is unremarkable. Other: No free fluid or fluid collections. Musculoskeletal: Previous left hip pinning. Degenerative disc disease noted. No acute or suspicious osseous findings. IMPRESSION: 1. Stable exam. Borderline enlarged upper abdominal and left external iliac lymph nodes are stable in size in the interval. No new or progressive disease identified. 2. Emphysema and aortic atherosclerosis. 3. Coronary artery calcifications. 4. Stable benign hemangioma within segment 5 of the liver. Aortic Atherosclerosis (ICD10-I70.0) and Emphysema (ICD10-J43.9). Electronically Signed   By: Kerby Moors M.D.   On: 05/23/2020 09:17   CT Chest Wo Contrast  Result Date: 05/23/2020 CLINICAL DATA:  Non-small cell  lung cancer.  Staging. EXAM: CT CHEST, ABDOMEN AND PELVIS WITHOUT CONTRAST TECHNIQUE: Multidetector CT imaging of the chest, abdomen and pelvis was performed following the standard protocol without IV contrast. COMPARISON:  03/16/2020 FINDINGS: CT CHEST FINDINGS Cardiovascular: The heart size is normal. Trace pericardial effusion versus thickening, unchanged. Aortic atherosclerosis. Coronary artery calcifications. Mediastinum/Nodes: Index subcarinal lymph node measures 1.2 cm, image 32/2. Previously 1.3 cm (when remeasured). Index lower thoracic aorto-esophageal lymph node measures 1 cm, image 48/2. Unchanged. Low right paratracheal node measures 0.9 cm, image 23/2.  Unchanged. Lungs/Pleura: No pleural effusion. Moderate centrilobular and paraseptal emphysema. Unchanged scar like density in the right middle lobe, image 89/6. Previous subpleural nodule in the right base is  resolved. Musculoskeletal: Thoracic degenerative disc disease. CT ABDOMEN PELVIS FINDINGS Hepatobiliary: Previously characterized benign hemangioma within segment 5 the is unchanged measuring 2 cm. Within the limitations of unenhanced technique there are no additional liver abnormalities. 1 gallbladder is normal. No biliary dilatation. Pancreas: Unremarkable. No pancreatic ductal dilatation or surrounding inflammatory changes. Spleen: Normal in size without focal abnormality. Adrenals/Urinary Tract: Normal appearance of the adrenal glands. The kidneys are unremarkable. No mass, calcification or hydronephrosis identified. Urinary bladder is unremarkable. Stomach/Bowel: Stomach is normal. The appendix is visualized and is unremarkable. No bowel wall thickening, inflammation, or distension. Vascular/Lymphatic: Aortic atherosclerosis.  No aneurysm. Index gastrohepatic ligament node measures 0.9 cm, image 60/2. Previously 1.1 cm. Gastrohepatic ligament lymph node measures 1.5 cm, image 60/2. Index left external iliac node measures 0.9 cm, image 113/2. Previously 0.8 cm. Reproductive: Prostate is unremarkable. Other: No free fluid or fluid collections. Musculoskeletal: Previous left hip pinning. Degenerative disc disease noted. No acute or suspicious osseous findings. IMPRESSION: 1. Stable exam. Borderline enlarged upper abdominal and left external iliac lymph nodes are stable in size in the interval. No new or progressive disease identified. 2. Emphysema and aortic atherosclerosis. 3. Coronary artery calcifications. 4. Stable benign hemangioma within segment 5 of the liver. Aortic Atherosclerosis (ICD10-I70.0) and Emphysema (ICD10-J43.9). Electronically Signed   By: Kerby Moors M.D.   On: 05/23/2020 09:17    ASSESSMENT AND PLAN: This is a very pleasant 65 years old African-American male recently diagnosed with a stage IV (TX, N3, M1c) non-small cell lung cancer, poorly differentiated adenocarcinoma presented with  bulky mediastinal as well as right hilar, supraclavicular, abdominal and right axillary lymphadenopathy in addition to metastatic liver lesions diagnosed in January 2021. Molecular studies by guardant 360 shows no actionable mutations.  The patient is not a candidate for treatment with targeted therapy or enrollment in the clinical trial with the [facility] regimen. The patient is currently undergoing systemic chemotherapy with carboplatin for AUC of 5, Alimta 500 mg/M2 and Keytruda 200 mg IV every 3 weeks status post 16 cycles. Starting from cycle #5 he is treated with maintenance Alimta and Keytruda every 3 weeks.  Starting from cycle #12 the patient has been on treatment with single agent Keytruda because of the renal insufficiency. The patient continues to tolerate this treatment well with no concerning adverse effects. I recommended for him to proceed with cycle #17 today as planned. He will come back for follow-up visit in 3 weeks for evaluation before starting cycle #18. For the anemia his hemoglobin and hematocrit are better.  He was advised to increase his iron rich diet. The patient was advised to call immediately if he has any other concerning symptoms in the interval. The patient voices understanding of current disease status and treatment options and is in agreement with the  current care plan.  All questions were answered. The patient knows to call the clinic with any problems, questions or concerns. We can certainly see the patient much sooner if necessary.  Disclaimer: This note was dictated with voice recognition software. Similar sounding words can inadvertently be transcribed and may not be corrected upon review.

## 2020-06-14 NOTE — Progress Notes (Signed)
Per MD, states okay to treat with creat 1.9

## 2020-06-14 NOTE — Progress Notes (Signed)
Pt creatinine = 1.9. Per Dr. [name], okay to treat today 06/14/2020.

## 2020-06-14 NOTE — Telephone Encounter (Signed)
Is this okay to refill? 

## 2020-06-14 NOTE — Patient Instructions (Signed)
[facility] Discharge Instructions for Patients Receiving Chemotherapy  Today you received the following chemotherapy agents:  Keytruda.  To help prevent nausea and vomiting after your treatment, we encourage you to take your nausea medication as directed.   If you develop nausea and vomiting that is not controlled by your nausea medication, call the clinic.   BELOW ARE SYMPTOMS THAT SHOULD BE REPORTED IMMEDIATELY:  *FEVER GREATER THAN 100.5 F  *CHILLS WITH OR WITHOUT FEVER  NAUSEA AND VOMITING THAT IS NOT CONTROLLED WITH YOUR NAUSEA MEDICATION  *UNUSUAL SHORTNESS OF BREATH  *UNUSUAL BRUISING OR BLEEDING  TENDERNESS IN MOUTH AND THROAT WITH OR WITHOUT PRESENCE OF ULCERS  *URINARY PROBLEMS  *BOWEL PROBLEMS  UNUSUAL RASH Items with * indicate a potential emergency and should be followed up as soon as possible.  Feel free to call the clinic should you have any questions or concerns. The clinic phone number is [phone].  Please show the CHEMO ALERT CARD at check-in to the Emergency Department and triage nurse.    

## 2020-06-16 ENCOUNTER — Telehealth: Payer: Self-pay | Admitting: Internal Medicine

## 2020-06-16 NOTE — Telephone Encounter (Signed)
Per 1/19 los, appts already scheduled

## 2020-06-26 ENCOUNTER — Other Ambulatory Visit: Payer: Self-pay

## 2020-06-26 ENCOUNTER — Ambulatory Visit (INDEPENDENT_AMBULATORY_CARE_PROVIDER_SITE_OTHER): Payer: Medicare Other | Admitting: Family Medicine

## 2020-06-26 VITALS — HR 85 | Temp 97.2°F | Ht 75.0 in | Wt 231.6 lb

## 2020-06-26 DIAGNOSIS — R109 Unspecified abdominal pain: Secondary | ICD-10-CM | POA: Diagnosis not present

## 2020-06-26 DIAGNOSIS — F419 Anxiety disorder, unspecified: Secondary | ICD-10-CM

## 2020-06-26 DIAGNOSIS — I10 Essential (primary) hypertension: Secondary | ICD-10-CM | POA: Diagnosis not present

## 2020-06-26 DIAGNOSIS — R10A1 Flank pain, right side: Secondary | ICD-10-CM

## 2020-06-26 DIAGNOSIS — Z9181 History of falling: Secondary | ICD-10-CM

## 2020-06-26 DIAGNOSIS — R42 Dizziness and giddiness: Secondary | ICD-10-CM

## 2020-06-26 DIAGNOSIS — C3490 Malignant neoplasm of unspecified part of unspecified bronchus or lung: Secondary | ICD-10-CM

## 2020-06-26 DIAGNOSIS — Z09 Encounter for follow-up examination after completed treatment for conditions other than malignant neoplasm: Secondary | ICD-10-CM

## 2020-06-26 NOTE — Progress Notes (Signed)
Patient [facility]   Established Patient Office Visit  Subjective:  Patient ID: Kirk [name], male    DOB: Jul 16, 1955  Age: 65 y.o. MRN: [id number]  CC:  Chief Complaint  Patient presents with  . Flank Pain    Rt side pain , started about 1 week ago     HPI Kirk [name] is a 65 year old male who presents for Follow Up today.    Patient Active Problem List   Diagnosis Date Noted  . Port-A-Cath in place 12/29/2019  . Metastatic non-small cell lung cancer ([place]) 07/07/2019  . Encounter for antineoplastic chemotherapy 07/07/2019  . Encounter for antineoplastic immunotherapy 07/07/2019  . Goals of care, counseling/discussion 06/28/2019  . Adenocarcinoma of right lung, stage 4 ([place]) 06/28/2019  . Cancer associated pain 06/28/2019  . CAD (coronary artery disease) 06/08/2019  . Mediastinal lymphadenopathy 06/03/2019  . Liver metastasis ([place]) 06/03/2019  . Hemoglobin A1C between 7% and 9% indicating borderline diabetic control 05/10/2019  . COVID-19 virus infection 05/10/2019  . Atrial flutter with rapid ventricular response ([place]) 04/19/2019  . Chest pain with moderate risk for cardiac etiology 04/19/2019  . Paroxysmal atrial flutter ([employer]) 04/19/2019  . Chest congestion 07/10/2018  . Cough 07/10/2018  . NSTEMI (non-ST elevated myocardial infarction) ([place]) 09/04/2015  . PVD (peripheral vascular disease) ([place]) 06/08/2015  . DDD (degenerative disc disease), cervical 06/08/2015  . Hyperlipidemia 06/08/2015  . Diabetes mellitus with nephropathy ([place]) 06/08/2015  . Diabetes mellitus with peripheral vascular disease ([place]) 06/08/2015  . Annual physical exam 01/03/2015  . PAD (peripheral artery disease) ([place]) 02/18/2013  . Diabetes ([place]) 02/18/2013  . HTN (hypertension) 02/18/2013   Current Status: Since his last office visit, he has c/o right flank pain and constipation, which he takes laxative, etc. He continues to follow up with  chemotherapy treatments, per Dr. Mckinley Jewel, for Non-Small Cell Lung Cancer.Kirk [name] He is currently receiving chemotherapy treatments, per Dr. [name]. He is tolerating treatments well. He denies visual changes, chest pain, cough, shortness of breath, heart palpitations, and falls. He has occasional headaches and dizziness with position changes. Denies severe headaches, confusion, seizures, double vision, and blurred vision, nausea and vomiting.    He denies fevers, chills, fatigue, recent infections, weight loss, and night sweats. Denies GI problems such as diarrhea, and constipation. He has no reports of blood in stools, dysuria and hematuria. No depression or anxiety reported today. He is taking all medications as prescribed. 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   Past Medical History:  Diagnosis Date  . Arthritis    "right knee" (09/04/2015)  . Atrial flutter with rapid ventricular response ([place]) 04/19/2019  . Dizziness 10/2019  . Dyspnea    increased exertion  . HTN (hypertension)   . Hyperlipidemia   . Lung cancer ([place]) 05/2019  . NSTEMI (non-ST elevated myocardial infarction) ([place]) 09/04/2015   a. cath 09/05/2015: 95% stenosis mid-LAD, 55% mid-RCA, and 40% prox-RCA. PCI performed w/ Synergy DES to LAD  . PAD (peripheral artery disease) (H[facility])    Stenting of bilateral iliacs in 2003.  Kirk [name] Refusal of blood transfusions as patient is Jehovah's Witness   . Type II diabetes mellitus ([place])     Past Surgical History:  Procedure Laterality Date  . CARDIAC CATHETERIZATION N/A 09/05/2015   Procedure: Left Heart Cath and Coronary Angiography;  Surgeon: Troy Sine, MD;  Location: [facility];  Service: Cardiovascular;  Laterality: N/A;  . CARDIAC CATHETERIZATION  N/A 09/05/2015   Procedure: Coronary Stent Intervention;  Surgeon: Troy Sine, MD;  Location: [facility];  Service: Cardiovascular;  Laterality: N/A;  . IR IMAGING GUIDED PORT INSERTION  07/15/2019  .  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 PERIPHERAL VASCULAR CATHETERIZATION Bilateral 2003   "stenting"  . TONSILLECTOMY  ~ 1963    Family History  Problem Relation Age of Onset  . Lymphoma Mother   . Cancer - Prostate Father   . CAD Neg Hx   . Colon cancer Neg Hx   . Rectal cancer Neg Hx   . Stomach cancer Neg Hx   . Esophageal cancer Neg Hx     Social History   Socioeconomic History  . Marital status: Married    Spouse name: Not on file  . Number of children: 1  . Years of education: Not on file  . Highest education level: Not on file  Occupational History  . Occupation: Acupuncturist  Tobacco Use  . Smoking status: Former Smoker    Packs/day: 2.00    Years: 35.00    Pack years: 70.00    Types: Cigarettes    Quit date: 02/19/2003    Years since quitting: 17.3  . Smokeless tobacco: Never Used  Vaping Use  . Vaping Use: Never used  Substance and Sexual Activity  . Alcohol use: Not Currently    Alcohol/week: 0.0 standard drinks  . Drug use: Not Currently    Types: Marijuana  . Sexual activity: Not Currently  Other Topics Concern  . Not on file  Social History Narrative  . Not on file   Social Determinants of Health   Financial Resource Strain: Not on file  Food Insecurity: Not on file  Transportation Needs: Not on file  Physical Activity: Not on file  Stress: Not on file  Social Connections: Not on file  Intimate Partner Violence: Not on file    Outpatient Medications Prior to Visit  Medication Sig Dispense Refill  . acetaminophen (TYLENOL) 500 MG tablet Take 500 mg by mouth every 6 (six) hours as needed for moderate pain or fever.     Kirk [name] aspirin EC 81 MG tablet Take 81 mg by mouth at bedtime.    . carvedilol (COREG) 12.5 MG tablet Take 1 tablet (12.5 mg total) by mouth 2 (two) times daily with a meal. 180 tablet 3  . cetirizine (ZYRTEC) 10 MG tablet Take 1 tablet (10 mg total) by mouth daily. 90 tablet 3  . clopidogrel  (PLAVIX) 75 MG tablet Take 1 tablet (75 mg total) by mouth daily. 90 tablet 3  . docusate sodium (COLACE) 100 MG capsule Take 1 capsule (100 mg total) by mouth 2 (two) times daily as needed for mild constipation. 60 capsule 11  . ferrous sulfate 324 MG TBEC Take 1 tablet (324 mg total) by mouth daily with breakfast. 30 tablet 11  . folic acid (FOLVITE) 1 MG tablet Take 1 tablet (1 mg total) by mouth daily. 90 tablet 3  . furosemide (LASIX) 20 MG tablet Take 1 tablet (20 mg total) by mouth 2 (two) times daily. 60 tablet 1  . gabapentin (NEURONTIN) 300 MG capsule Take 1 capsule (300 mg total) by mouth 3 (three) times daily. 270 capsule 3  . glimepiride (AMARYL) 4 MG tablet Take 1 tablet (4 mg total) by mouth daily. 90 tablet 3  . ipratropium (ATROVENT) 0.03 % nasal spray USE 2 SPRAYS IN  EACH NOSTRIL TWICE DAILY (Patient taking differently: Place 1 spray into both nostrils daily as needed (allergies).) 30 mL 2  . lactulose (CHRONULAC) 10 GM/15ML solution Take 30 mLs (20 g total) by mouth 2 (two) times daily as needed for moderate constipation. 240 mL 11  . lidocaine-prilocaine (EMLA) cream APPLY TO THE PORT-A-CATH SITE 30-60 MINUTES BEFORE TREATMENT. (Patient taking differently: Apply 1 application topically daily as needed (access port). Apply to the Port-A-Cath site 30-60 minutes before treatment.) 30 g 0  . meclizine (ANTIVERT) 25 MG tablet Take 1 tablet (25 mg total) by mouth 3 (three) times daily as needed for dizziness. 90 tablet 3  . metFORMIN (GLUCOPHAGE) 1000 MG tablet Take 1 tablet (1,000 mg total) by mouth 2 (two) times daily with a meal. 180 tablet 3  . multivitamin (ONE-A-DAY MEN'S) TABS tablet Take 1 tablet by mouth daily. 30 tablet 6  . nitroGLYCERIN (NITROSTAT) 0.4 MG SL tablet Place 1 tablet (0.4 mg total) under the tongue every 5 (five) minutes x 3 doses as needed for chest pain. 25 tablet 2  . oxyCODONE-acetaminophen (PERCOCET/ROXICET) 5-325 MG tablet Take 1 tablet by mouth every 6  (six) hours as needed for severe pain. 20 tablet 0  . pantoprazole (PROTONIX) 40 MG tablet Take 1 tablet (40 mg total) by mouth daily. 90 tablet 3  . prochlorperazine (COMPAZINE) 10 MG tablet Take 1 tablet (10 mg total) by mouth every 6 (six) hours as needed for nausea or vomiting. 30 tablet 11  . rosuvastatin (CRESTOR) 40 MG tablet Take 1 tablet (40 mg total) by mouth daily. 90 tablet 3  . sucralfate (CARAFATE) 1 GM/10ML suspension Take 10 mLs (1 g total) by mouth 4 (four) times daily. 420 mL 1  . Hydrocortisone Acetate 1 % CREA Apply 15 g topically 2 (two) times daily. 15 g 3   No facility-administered medications prior to visit.    No Known Allergies  ROS Review of Systems  Constitutional: Negative.   HENT: Negative.   Eyes: Negative.   Respiratory: Negative.   Cardiovascular: Negative.   Gastrointestinal: Positive for constipation (occasional ) and nausea (occasional ).  Endocrine: Negative.   Genitourinary: Negative.   Musculoskeletal: Positive for arthralgias (generalized joint pain ).       Feet pain r/t Neuropathy.   Skin: Negative.   Allergic/Immunologic: Negative.   Neurological: Positive for dizziness (occasional ) and headaches (occasional ).  Hematological: Negative.   Psychiatric/Behavioral: Negative.    Objective:    Physical Exam Vitals and nursing note reviewed.  Constitutional:      Appearance: Normal appearance.  HENT:     Head: Normocephalic and atraumatic.     Nose: Nose normal.     Mouth/Throat:     Mouth: Mucous membranes are moist.     Pharynx: Oropharynx is clear.  Cardiovascular:     Rate and Rhythm: Normal rate and regular rhythm.     Pulses: Normal pulses.     Heart sounds: Normal heart sounds.  Pulmonary:     Effort: Pulmonary effort is normal.     Breath sounds: Normal breath sounds.  Abdominal:     General: Bowel sounds are normal.     Palpations: Abdomen is soft.  Musculoskeletal:        General: Normal range of motion.      Cervical back: Normal range of motion and neck supple.  Skin:    General: Skin is warm and dry.  Neurological:     General: No focal deficit  present.     Mental Status: He is alert and oriented to person, place, and time.  Psychiatric:        Mood and Affect: Mood normal.        Behavior: Behavior normal.        Thought Content: Thought content normal.        Judgment: Judgment normal.     Pulse 85   Temp (!) 97.2 F (36.2 C) (Temporal)   Ht 6\' 3"  (1.905 m)   Wt 231 lb 9.6 oz (105.1 kg)   SpO2 95%   BMI 28.95 kg/m  Wt Readings from Last 3 Encounters:  06/26/20 231 lb 9.6 oz (105.1 kg)  06/14/20 231 lb (104.8 kg)  05/24/20 229 lb 14.4 oz (104.3 kg)     Health Maintenance Due  Topic Date Due  . FOOT EXAM  07/02/2017  . URINE MICROALBUMIN  01/28/2018  . OPHTHALMOLOGY EXAM  07/11/2018  . COVID-19 Vaccine (2 - Pfizer risk 4-dose series) 02/08/2020  . PNA vac Low Risk Adult (2 of 2 - PPSV23) 03/21/2020  . HEMOGLOBIN A1C  05/23/2020    There are no preventive care reminders to display for this patient.  Lab Results  Component Value Date   TSH 1.016 06/14/2020   Lab Results  Component Value Date   WBC 6.6 06/14/2020   HGB 10.6 (L) 06/14/2020   HCT 33.6 (L) 06/14/2020   MCV 94.6 06/14/2020   PLT 219 06/14/2020   Lab Results  Component Value Date   NA 139 06/14/2020   K 4.1 06/14/2020   CO2 23 06/14/2020   GLUCOSE 233 (H) 06/14/2020   BUN 21 06/14/2020   CREATININE 1.90 (H) 06/14/2020   BILITOT 0.3 06/14/2020   ALKPHOS 70 06/14/2020   AST 21 06/14/2020   ALT 13 06/14/2020   PROT 8.6 (H) 06/14/2020   ALBUMIN 3.8 06/14/2020   CALCIUM 9.5 06/14/2020   ANIONGAP 10 06/14/2020   GFR 101.07 05/31/2019   Lab Results  Component Value Date   CHOL 102 07/22/2019   Lab Results  Component Value Date   HDL 34 (L) 07/22/2019   Lab Results  Component Value Date   LDLCALC 48 07/22/2019   Lab Results  Component Value Date   TRIG 106 07/22/2019   Lab Results   Component Value Date   CHOLHDL 3.0 07/22/2019   Lab Results  Component Value Date   HGBA1C 6.5 (A) 11/22/2019   HGBA1C 6.5 11/22/2019   HGBA1C 6.5 (A) 11/22/2019   HGBA1C 6.5 11/22/2019      Assessment & Plan:   1. Metastatic non-small cell lung cancer (HCC) Stable. He is tolerating treatments well.   2. Essential hypertension He will continue to take medications as prescribed, to decrease high sodium intake, excessive alcohol intake, increase potassium intake, smoking cessation, and increase physical activity of at least 30 minutes of cardio activity daily. He will continue to follow Heart Healthy or DASH diet.  3. Right flank pain  4. Anxiety Stable today.   5. Dizziness Mild. He will continue Meclizine as needed. He will continue to change positions slowly when ambulating. Monitor.   6. History of fall  7. Follow up He will follow up in 6 months.   No orders of the defined types were placed in this encounter.   No orders of the defined types were placed in this encounter.   Referral Orders  No referral(s) requested today    Kathe Becton, MSN, ANE, FNP-BC [facility] [address] [place], [place] [address] [phone]- fax    Problem List Items Addressed This Visit      Respiratory   Metastatic non-small cell lung cancer ([place]) - Primary    Other Visit Diagnoses    Essential hypertension       Right flank pain       Anxiety       Dizziness       History of fall       Follow up          No orders of the defined types were placed in this encounter.   Follow-up: No follow-ups on file.    [name], FNP

## 2020-06-27 ENCOUNTER — Encounter: Payer: Self-pay | Admitting: Family Medicine

## 2020-06-28 ENCOUNTER — Encounter: Payer: Self-pay | Admitting: Cardiovascular Disease

## 2020-06-28 ENCOUNTER — Ambulatory Visit (INDEPENDENT_AMBULATORY_CARE_PROVIDER_SITE_OTHER): Payer: Medicare Other | Admitting: Cardiovascular Disease

## 2020-06-28 ENCOUNTER — Other Ambulatory Visit: Payer: Self-pay

## 2020-06-28 VITALS — BP 132/70 | HR 84 | Ht 75.0 in | Wt 236.4 lb

## 2020-06-28 DIAGNOSIS — E7800 Pure hypercholesterolemia, unspecified: Secondary | ICD-10-CM

## 2020-06-28 DIAGNOSIS — I10 Essential (primary) hypertension: Secondary | ICD-10-CM

## 2020-06-28 DIAGNOSIS — I2510 Atherosclerotic heart disease of native coronary artery without angina pectoris: Secondary | ICD-10-CM | POA: Diagnosis not present

## 2020-06-28 DIAGNOSIS — I4892 Unspecified atrial flutter: Secondary | ICD-10-CM

## 2020-06-28 LAB — LIPID PANEL
Chol/HDL Ratio: 3.3 ratio (ref 0.0–5.0)
Cholesterol, Total: 117 mg/dL (ref 100–199)
HDL: 35 mg/dL — ABNORMAL LOW (ref >39)
LDL Chol Calc (NIH): 59 mg/dL (ref 0–99)
Triglycerides: 132 mg/dL (ref 0–149)
VLDL Cholesterol Cal: 23 mg/dL (ref 5–40)

## 2020-06-28 LAB — HEPATIC FUNCTION PANEL
ALT: 12 IU/L (ref 0–44)
AST: 19 IU/L (ref 0–40)
Albumin: 4.5 g/dL (ref 3.8–4.8)
Alkaline Phosphatase: 91 IU/L (ref 44–121)
Bilirubin Total: 0.2 mg/dL (ref 0.0–1.2)
Bilirubin, Direct: <0.1 mg/dL (ref 0.00–0.40)
Total Protein: 8.5 g/dL (ref 6.0–8.5)

## 2020-06-28 NOTE — Patient Instructions (Signed)
Medication Instructions:  No changes *If you need a refill on your cardiac medications before your next appointment, please call your pharmacy*   Lab Work: Today: lipids, liver function  If you have labs (blood work) drawn today and your tests are completely normal, you will receive your results only by: [name] MyChart Message (if you have MyChart) OR . A paper copy in the mail If you have any lab test that is abnormal or we need to change your treatment, we will call you to review the results.   Testing/Procedures: none   Follow-Up: At [facility], you and your health needs are our priority.  As part of our continuing mission to provide you with exceptional heart care, we have created designated Provider Care Teams.  These Care Teams include your primary Cardiologist (physician) and Advanced Practice Providers (APPs -  Physician Assistants and Nurse Practitioners) who all work together to provide you with the care you need, when you need it.     Your next appointment:   12 month(s)  The format for your next appointment:   In Person  Provider:   You may see Lauree Chandler, MD or one of the following Advanced Practice Providers on your designated Care Team:    Melina Copa, PA-C  Ermalinda Barrios, PA-C    Other Instructions

## 2020-06-28 NOTE — Progress Notes (Signed)
Chief Complaint  Patient presents with  . Follow-up    CAD   History of Present Illness: 65 yo male with history of CAD, PAD, HLD, HTN, atrial flutter and DM who is here today for follow up. He has a history of PAD s/p bilateral lower extremity stenting in 2003. ABI March 2019 stable. He had his first cardiac event in April 2017 when he was admitted to [facility] with a NSTEMI. Cardiac cath 09/05/15 with high grade mid LAD stenosis treated with a drug eluting stent x 1. There was mild to moderate non-obstructive disease in the Circumflex and RCA. Echo April 2017 with normal LV systolic function. He was seen in our office April 2019 with c/o dyspnea. Nuclear stress test April 2019 with no ischemia. He was admitted to [facility] in November 2020 with atrial flutter. Echo November 2020 with LVEF=60-65%, no valve disease. He was started on Eliquis. CT chest December 2020 with multiple lesions. He has since been diagnosed with non small cell lung cancer and is on chemotherapy. He stopped Eliquis due to cost and does not wish to restart. He does not wish to start coumadin.   He is here today for follow up. The patient denies any chest pain, dyspnea, palpitations, lower extremity edema, orthopnea, PND, dizziness, near syncope or syncope.   Primary Care Physician: [name], FNP  Past Medical History:  Diagnosis Date  . Arthritis    "right knee" (09/04/2015)  . Atrial flutter with rapid ventricular response ([place]) 04/19/2019  . Dizziness 10/2019  . Dyspnea    increased exertion  . HTN (hypertension)   . Hyperlipidemia   . Lung cancer ([place]) 05/2019  . NSTEMI (non-ST elevated myocardial infarction) ([place]) 09/04/2015   a. cath 09/05/2015: 95% stenosis mid-LAD, 55% mid-RCA, and 40% prox-RCA. PCI performed w/ Synergy DES to LAD  . PAD (peripheral artery disease) (H[facility])    Stenting of bilateral iliacs in 2003.  [name] Refusal of blood transfusions as patient is Jehovah's Witness   . Type II diabetes mellitus  ([place])     Past Surgical History:  Procedure Laterality Date  . CARDIAC CATHETERIZATION N/A 09/05/2015   Procedure: Left Heart Cath and Coronary Angiography;  Surgeon: Troy Sine, MD;  Location: [facility];  Service: Cardiovascular;  Laterality: N/A;  . CARDIAC CATHETERIZATION N/A 09/05/2015   Procedure: Coronary Stent Intervention;  Surgeon: Troy Sine, MD;  Location: [facility];  Service: Cardiovascular;  Laterality: N/A;  . IR IMAGING GUIDED PORT INSERTION  07/15/2019  . 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 PERIPHERAL VASCULAR CATHETERIZATION Bilateral 2003   "stenting"  . TONSILLECTOMY  ~ 1963    Current Outpatient Medications  Medication Sig Dispense Refill  . acetaminophen (TYLENOL) 500 MG tablet Take 500 mg by mouth every 6 (six) hours as needed for moderate pain or fever.     [name] aspirin EC 81 MG tablet Take 81 mg by mouth at bedtime.    . carvedilol (COREG) 12.5 MG tablet Take 1 tablet (12.5 mg total) by mouth 2 (two) times daily with a meal. 180 tablet 3  . cetirizine (ZYRTEC) 10 MG tablet Take 1 tablet (10 mg total) by mouth daily. 90 tablet 3  . clopidogrel (PLAVIX) 75 MG tablet Take 1 tablet (75 mg total) by mouth daily. 90 tablet 3  . docusate sodium (COLACE) 100 MG capsule Take 1 capsule (100 mg total) by mouth 2 (  two) times daily as needed for mild constipation. 60 capsule 11  . ferrous sulfate 324 MG TBEC Take 1 tablet (324 mg total) by mouth daily with breakfast. 30 tablet 11  . folic acid (FOLVITE) 1 MG tablet Take 1 tablet (1 mg total) by mouth daily. 90 tablet 3  . furosemide (LASIX) 20 MG tablet Take 1 tablet (20 mg total) by mouth 2 (two) times daily. 60 tablet 1  . gabapentin (NEURONTIN) 300 MG capsule Take 1 capsule (300 mg total) by mouth 3 (three) times daily. 270 capsule 3  . glimepiride (AMARYL) 4 MG tablet Take 1 tablet (4 mg total) by mouth daily. 90 tablet 3  . Hydrocortisone Acetate 1 % CREA Apply 15 g  topically 2 (two) times daily. 15 g 3  . ipratropium (ATROVENT) 0.03 % nasal spray USE 2 SPRAYS IN EACH NOSTRIL TWICE DAILY (Patient taking differently: Place 1 spray into both nostrils daily as needed (allergies).) 30 mL 2  . lactulose (CHRONULAC) 10 GM/15ML solution Take 30 mLs (20 g total) by mouth 2 (two) times daily as needed for moderate constipation. 240 mL 11  . lidocaine-prilocaine (EMLA) cream APPLY TO THE PORT-A-CATH SITE 30-60 MINUTES BEFORE TREATMENT. (Patient taking differently: Apply 1 application topically daily as needed (access port). Apply to the Port-A-Cath site 30-60 minutes before treatment.) 30 g 0  . meclizine (ANTIVERT) 25 MG tablet Take 1 tablet (25 mg total) by mouth 3 (three) times daily as needed for dizziness. 90 tablet 3  . metFORMIN (GLUCOPHAGE) 1000 MG tablet Take 1 tablet (1,000 mg total) by mouth 2 (two) times daily with a meal. 180 tablet 3  . multivitamin (ONE-A-DAY MEN'S) TABS tablet Take 1 tablet by mouth daily. 30 tablet 6  . nitroGLYCERIN (NITROSTAT) 0.4 MG SL tablet Place 1 tablet (0.4 mg total) under the tongue every 5 (five) minutes x 3 doses as needed for chest pain. 25 tablet 2  . oxyCODONE-acetaminophen (PERCOCET/ROXICET) 5-325 MG tablet Take 1 tablet by mouth every 6 (six) hours as needed for severe pain. 20 tablet 0  . pantoprazole (PROTONIX) 40 MG tablet Take 1 tablet (40 mg total) by mouth daily. 90 tablet 3  . prochlorperazine (COMPAZINE) 10 MG tablet Take 1 tablet (10 mg total) by mouth every 6 (six) hours as needed for nausea or vomiting. 30 tablet 11  . rosuvastatin (CRESTOR) 40 MG tablet Take 1 tablet (40 mg total) by mouth daily. 90 tablet 3   No current facility-administered medications for this visit.    No Known Allergies  Social History   Socioeconomic History  . Marital status: Married    Spouse name: Not on file  . Number of children: 1  . Years of education: Not on file  . Highest education level: Not on file  Occupational  History  . Occupation: Acupuncturist  Tobacco Use  . Smoking status: Former Smoker    Packs/day: 2.00    Years: 35.00    Pack years: 70.00    Types: Cigarettes    Quit date: 02/19/2003    Years since quitting: 17.3  . Smokeless tobacco: Never Used  Vaping Use  . Vaping Use: Never used  Substance and Sexual Activity  . Alcohol use: Not Currently    Alcohol/week: 0.0 standard drinks  . Drug use: Not Currently    Types: Marijuana  . Sexual activity: Not Currently  Other Topics Concern  . Not on file  Social History Narrative  . Not on file   Social  Determinants of Health   Financial Resource Strain: Not on file  Food Insecurity: Not on file  Transportation Needs: Not on file  Physical Activity: Not on file  Stress: Not on file  Social Connections: Not on file  Intimate Partner Violence: Not on file    Family History  Problem Relation Age of Onset  . Lymphoma Mother   . Cancer - Prostate Father   . CAD Neg Hx   . Colon cancer Neg Hx   . Rectal cancer Neg Hx   . Stomach cancer Neg Hx   . Esophageal cancer Neg Hx     Review of Systems:  As stated in the HPI and otherwise negative.   BP 132/70   Pulse 84   Ht 6\' 3"  (1.905 m)   Wt 236 lb 6.4 oz (107.2 kg)   SpO2 97%   BMI 29.55 kg/m   Physical Examination:  General: Well developed, well nourished, NAD  HEENT: OP clear, mucus membranes moist  SKIN: warm, dry. No rashes. Neuro: No focal deficits  Musculoskeletal: Muscle strength 5/5 all ext  Psychiatric: Mood and affect normal  Neck: No JVD, no carotid bruits, no thyromegaly, no lymphadenopathy.  Lungs:Clear bilaterally, no wheezes, rhonci, crackles Cardiovascular: Regular rate and rhythm. No murmurs, gallops or rubs. Abdomen:Soft. Bowel sounds present. Non-tender.  Extremities: No lower extremity edema. Pulses are 2 + in the bilateral DP/PT.    EKG:  EKG is  ordered today. The ekg ordered today demonstrates sinus  Recent Labs: 06/14/2020: ALT 13;  BUN 21; Creatinine 1.90; Hemoglobin 10.6; Platelet Count 219; Potassium 4.1; Sodium 139; TSH 1.016   Lipid Panel    Component Value Date/Time   CHOL 102 07/22/2019 0824   TRIG 106 07/22/2019 0824   HDL 34 (L) 07/22/2019 0824   CHOLHDL 3.0 07/22/2019 0824   CHOLHDL 3.7 03/12/2016 0837   VLDL 17 03/12/2016 0837   LDLCALC 48 07/22/2019 0824     Wt Readings from Last 3 Encounters:  06/28/20 236 lb 6.4 oz (107.2 kg)  06/26/20 231 lb 9.6 oz (105.1 kg)  06/14/20 231 lb (104.8 kg)     Other studies Reviewed: Additional studies/ records that were reviewed today include: . Review of the above records demonstrates:   Assessment and Plan:   1. CAD without angina: NSTEMI April 2017 with severe stenosis mid LAD treated with drug eluting stent. Mild to moderate non-obstrucitve disease in the RCA and Circumflex. He has no chest pain. Will continue ASA, Plavix, statin and beta blocker.   2. HTN: BP is controlled. No changes  3. HLD: LDL at goal in February 2021. Check lipids and LFTS. Continue statin  4. PAD: No claudication  5. Atrial flutter, paroxysmal: He is in sinus today. He does not wish to restart Eliquis due to cost and will not consider coumadin. If he has recurrence, will need to consider anti-coagulation. Continue beta blocker.    Current medicines are reviewed at length with the patient today.  The patient does not have concerns regarding medicines.  The following changes have been made:  no change  Labs/ tests ordered today include:   Orders Placed This Encounter  Procedures  . Lipid panel  . Hepatic function panel  . EKG 12-Lead     Disposition:   FU with me in 12 months   Signed, Lauree Chandler, MD 06/28/2020 10:10 AM    [facility] [address], [place], [place]  [address] Phone: [phone]; Fax: [phone]  

## 2020-07-05 ENCOUNTER — Inpatient Hospital Stay: Payer: Medicare Other

## 2020-07-05 ENCOUNTER — Inpatient Hospital Stay (HOSPITAL_BASED_OUTPATIENT_CLINIC_OR_DEPARTMENT_OTHER): Payer: Medicare Other | Admitting: Internal Medicine

## 2020-07-05 ENCOUNTER — Inpatient Hospital Stay: Payer: Medicare Other | Attending: Internal Medicine

## 2020-07-05 ENCOUNTER — Other Ambulatory Visit: Payer: Self-pay

## 2020-07-05 VITALS — BP 148/60 | HR 84 | Temp 97.8°F | Resp 18 | Wt 233.5 lb

## 2020-07-05 DIAGNOSIS — Z7902 Long term (current) use of antithrombotics/antiplatelets: Secondary | ICD-10-CM | POA: Diagnosis not present

## 2020-07-05 DIAGNOSIS — E785 Hyperlipidemia, unspecified: Secondary | ICD-10-CM | POA: Diagnosis not present

## 2020-07-05 DIAGNOSIS — I252 Old myocardial infarction: Secondary | ICD-10-CM | POA: Insufficient documentation

## 2020-07-05 DIAGNOSIS — Z95828 Presence of other vascular implants and grafts: Secondary | ICD-10-CM

## 2020-07-05 DIAGNOSIS — C3490 Malignant neoplasm of unspecified part of unspecified bronchus or lung: Secondary | ICD-10-CM

## 2020-07-05 DIAGNOSIS — Z5112 Encounter for antineoplastic immunotherapy: Secondary | ICD-10-CM | POA: Insufficient documentation

## 2020-07-05 DIAGNOSIS — Z79899 Other long term (current) drug therapy: Secondary | ICD-10-CM | POA: Insufficient documentation

## 2020-07-05 DIAGNOSIS — I10 Essential (primary) hypertension: Secondary | ICD-10-CM | POA: Diagnosis not present

## 2020-07-05 DIAGNOSIS — C3491 Malignant neoplasm of unspecified part of right bronchus or lung: Secondary | ICD-10-CM | POA: Insufficient documentation

## 2020-07-05 DIAGNOSIS — N289 Disorder of kidney and ureter, unspecified: Secondary | ICD-10-CM | POA: Insufficient documentation

## 2020-07-05 DIAGNOSIS — Z7982 Long term (current) use of aspirin: Secondary | ICD-10-CM | POA: Insufficient documentation

## 2020-07-05 DIAGNOSIS — C787 Secondary malignant neoplasm of liver and intrahepatic bile duct: Secondary | ICD-10-CM | POA: Diagnosis not present

## 2020-07-05 DIAGNOSIS — I2510 Atherosclerotic heart disease of native coronary artery without angina pectoris: Secondary | ICD-10-CM

## 2020-07-05 DIAGNOSIS — E1151 Type 2 diabetes mellitus with diabetic peripheral angiopathy without gangrene: Secondary | ICD-10-CM | POA: Insufficient documentation

## 2020-07-05 DIAGNOSIS — Z7984 Long term (current) use of oral hypoglycemic drugs: Secondary | ICD-10-CM | POA: Insufficient documentation

## 2020-07-05 DIAGNOSIS — R5383 Other fatigue: Secondary | ICD-10-CM

## 2020-07-05 DIAGNOSIS — D649 Anemia, unspecified: Secondary | ICD-10-CM | POA: Insufficient documentation

## 2020-07-05 LAB — CMP (CANCER CENTER ONLY)
ALT: 15 U/L (ref 0–44)
AST: 20 U/L (ref 15–41)
Albumin: 3.9 g/dL (ref 3.5–5.0)
Alkaline Phosphatase: 78 U/L (ref 38–126)
Anion gap: 8 (ref 5–15)
BUN: 22 mg/dL (ref 8–23)
CO2: 24 mmol/L (ref 22–32)
Calcium: 9.5 mg/dL (ref 8.9–10.3)
Chloride: 107 mmol/L (ref 98–111)
Creatinine: 1.73 mg/dL — ABNORMAL HIGH (ref 0.61–1.24)
GFR, Estimated: 43 mL/min — ABNORMAL LOW (ref >60)
Glucose, Bld: 147 mg/dL — ABNORMAL HIGH (ref 70–99)
Potassium: 4.1 mmol/L (ref 3.5–5.1)
Sodium: 139 mmol/L (ref 135–145)
Total Bilirubin: 0.3 mg/dL (ref 0.3–1.2)
Total Protein: 8.5 g/dL — ABNORMAL HIGH (ref 6.5–8.1)

## 2020-07-05 LAB — CBC WITH DIFFERENTIAL (CANCER CENTER ONLY)
Abs Immature Granulocytes: 0.01 10*3/uL (ref 0.00–0.07)
Basophils Absolute: 0.1 10*3/uL (ref 0.0–0.1)
Basophils Relative: 1 %
Eosinophils Absolute: 0.2 10*3/uL (ref 0.0–0.5)
Eosinophils Relative: 3 %
HCT: 33.4 % — ABNORMAL LOW (ref 39.0–52.0)
Hemoglobin: 10.6 g/dL — ABNORMAL LOW (ref 13.0–17.0)
Immature Granulocytes: 0 %
Lymphocytes Relative: 54 %
Lymphs Abs: 3.5 10*3/uL (ref 0.7–4.0)
MCH: 29.4 pg (ref 26.0–34.0)
MCHC: 31.7 g/dL (ref 30.0–36.0)
MCV: 92.5 fL (ref 80.0–100.0)
Monocytes Absolute: 0.6 10*3/uL (ref 0.1–1.0)
Monocytes Relative: 10 %
Neutro Abs: 2 10*3/uL (ref 1.7–7.7)
Neutrophils Relative %: 32 %
Platelet Count: 223 10*3/uL (ref 150–400)
RBC: 3.61 MIL/uL — ABNORMAL LOW (ref 4.22–5.81)
RDW: 14 % (ref 11.5–15.5)
WBC Count: 6.4 10*3/uL (ref 4.0–10.5)
nRBC: 0 % (ref 0.0–0.2)

## 2020-07-05 LAB — TSH: TSH: 1.166 u[IU]/mL (ref 0.320–4.118)

## 2020-07-05 MED ORDER — HEPARIN SOD (PORK) LOCK FLUSH 100 UNIT/ML IV SOLN
500.0000 [IU] | Freq: Once | INTRAVENOUS | Status: AC | PRN
  Administered 2020-07-05: 500 [IU]
  Filled 2020-07-05: qty 5

## 2020-07-05 MED ORDER — SODIUM CHLORIDE 0.9 % IV SOLN
Freq: Once | INTRAVENOUS | Status: AC
  Filled 2020-07-05: qty 250

## 2020-07-05 MED ORDER — SODIUM CHLORIDE 0.9% FLUSH
10.0000 mL | INTRAVENOUS | Status: DC | PRN
  Administered 2020-07-05: 10 mL
  Filled 2020-07-05: qty 10

## 2020-07-05 MED ORDER — SODIUM CHLORIDE 0.9% FLUSH
10.0000 mL | Freq: Once | INTRAVENOUS | Status: DC
  Filled 2020-07-05: qty 10

## 2020-07-05 MED ORDER — SODIUM CHLORIDE 0.9 % IV SOLN
200.0000 mg | Freq: Once | INTRAVENOUS | Status: AC
  Administered 2020-07-05: 200 mg via INTRAVENOUS
  Filled 2020-07-05: qty 8

## 2020-07-05 NOTE — Progress Notes (Signed)
[facility] [phone]   [phone]  OFFICE PROGRESS NOTE  [name], FNP [name] [address]  DIAGNOSIS: Stage IV (TX, N3, M1c) non-small cell lung cancer, poorly differentiated adenocarcinoma presented with bulky mediastinal lymphadenopathy in addition to right axillary lymphadenopathy and metastatic liver lesions, abdominal lymphadenopathy diagnosed in January 2021. Molecular studies by [employer] 360 that shows no actionable mutations.  PRIOR THERAPY: None  CURRENT THERAPY: Systemic chemotherapy with carboplatin for AUC of 5, Alimta 500 mg/M2 and Keytruda 200 mg IV every 3 weeks.  First dose July 14, 2019.  Status post 17 cycles. Starting from cycle #5 the patient will be treated with maintenance Alimta and Keytruda every 3 weeks.  Starting from cycle #12 he will be on single agent treatment with Keytruda secondary to renal insufficiency.  INTERVAL HISTORY: Kirk [name] 65 y.o. male returns to the clinic today for follow-up visit.  The patient is feeling fine today with no concerning complaints.  He denied having any significant fatigue or weakness.  He denied having any nausea, vomiting, diarrhea or constipation.  He has no headache or visual changes.  He denied having any chest pain, shortness of breath, cough or hemoptysis.  He continues to tolerate his treatment with Keytruda fairly well.  The patient is here today for evaluation before starting cycle #18 of his treatment.  MEDICAL HISTORY: Past Medical History:  Diagnosis Date  . Arthritis    "right knee" (09/04/2015)  . Atrial flutter with rapid ventricular response ([facility]) 04/19/2019  . Dizziness 10/2019  . Dyspnea    increased exertion  . HTN (hypertension)   . Hyperlipidemia   . Lung cancer ([facility]) 05/2019  . NSTEMI (non-ST elevated myocardial infarction) ([facility]) 09/04/2015   a. cath 09/05/2015: 95% stenosis mid-LAD, 55% mid-RCA, and 40% prox-RCA. PCI  performed w/ Synergy DES to LAD  . PAD (peripheral artery disease) (H[facility])    Stenting of bilateral iliacs in 2003.  [name] Refusal of blood transfusions as patient is Jehovah's Witness   . Type II diabetes mellitus (HCC)     ALLERGIES:  has No Known Allergies.  MEDICATIONS:  Current Outpatient Medications  Medication Sig Dispense Refill  . acetaminophen (TYLENOL) 500 MG tablet Take 500 mg by mouth every 6 (six) hours as needed for moderate pain or fever.     [name] aspirin EC 81 MG tablet Take 81 mg by mouth at bedtime.    . carvedilol (COREG) 12.5 MG tablet Take 1 tablet (12.5 mg total) by mouth 2 (two) times daily with a meal. 180 tablet 3  . cetirizine (ZYRTEC) 10 MG tablet Take 1 tablet (10 mg total) by mouth daily. 90 tablet 3  . clopidogrel (PLAVIX) 75 MG tablet Take 1 tablet (75 mg total) by mouth daily. 90 tablet 3  . docusate sodium (COLACE) 100 MG capsule Take 1 capsule (100 mg total) by mouth 2 (two) times daily as needed for mild constipation. 60 capsule 11  . ferrous sulfate 324 MG TBEC Take 1 tablet (324 mg total) by mouth daily with breakfast. 30 tablet 11  . folic acid (FOLVITE) 1 MG tablet Take 1 tablet (1 mg total) by mouth daily. 90 tablet 3  . furosemide (LASIX) 20 MG tablet Take 1 tablet (20 mg total) by mouth 2 (two) times daily. 60 tablet 1  . gabapentin (NEURONTIN) 300 MG capsule Take 1 capsule (300 mg total) by mouth 3 (three) times daily. 270 capsule 3  .  glimepiride (AMARYL) 4 MG tablet Take 1 tablet (4 mg total) by mouth daily. 90 tablet 3  . Hydrocortisone Acetate 1 % CREA Apply 15 g topically 2 (two) times daily. 15 g 3  . ipratropium (ATROVENT) 0.03 % nasal spray USE 2 SPRAYS IN EACH NOSTRIL TWICE DAILY (Patient taking differently: Place 1 spray into both nostrils daily as needed (allergies).) 30 mL 2  . lactulose (CHRONULAC) 10 GM/15ML solution Take 30 mLs (20 g total) by mouth 2 (two) times daily as needed for moderate constipation. 240 mL 11  . lidocaine-prilocaine  (EMLA) cream APPLY TO THE PORT-A-CATH SITE 30-60 MINUTES BEFORE TREATMENT. (Patient taking differently: Apply 1 application topically daily as needed (access port). Apply to the Port-A-Cath site 30-60 minutes before treatment.) 30 g 0  . meclizine (ANTIVERT) 25 MG tablet Take 1 tablet (25 mg total) by mouth 3 (three) times daily as needed for dizziness. 90 tablet 3  . metFORMIN (GLUCOPHAGE) 1000 MG tablet Take 1 tablet (1,000 mg total) by mouth 2 (two) times daily with a meal. 180 tablet 3  . multivitamin (ONE-A-DAY MEN'S) TABS tablet Take 1 tablet by mouth daily. 30 tablet 6  . nitroGLYCERIN (NITROSTAT) 0.4 MG SL tablet Place 1 tablet (0.4 mg total) under the tongue every 5 (five) minutes x 3 doses as needed for chest pain. 25 tablet 2  . oxyCODONE-acetaminophen (PERCOCET/ROXICET) 5-325 MG tablet Take 1 tablet by mouth every 6 (six) hours as needed for severe pain. 20 tablet 0  . pantoprazole (PROTONIX) 40 MG tablet Take 1 tablet (40 mg total) by mouth daily. 90 tablet 3  . prochlorperazine (COMPAZINE) 10 MG tablet Take 1 tablet (10 mg total) by mouth every 6 (six) hours as needed for nausea or vomiting. 30 tablet 11  . rosuvastatin (CRESTOR) 40 MG tablet Take 1 tablet (40 mg total) by mouth daily. 90 tablet 3   No current facility-administered medications for this visit.   Facility-Administered Medications Ordered in Other Visits  Medication Dose Route Frequency Provider Last Rate Last Admin  . sodium chloride flush (NS) 0.9 % injection 10 mL  10 mL Intracatheter Once Curt Bears, MD        SURGICAL HISTORY:  Past Surgical History:  Procedure Laterality Date  . CARDIAC CATHETERIZATION N/A 09/05/2015   Procedure: Left Heart Cath and Coronary Angiography;  Surgeon: Troy Sine, MD;  Location: [facility];  Service: Cardiovascular;  Laterality: N/A;  . CARDIAC CATHETERIZATION N/A 09/05/2015   Procedure: Coronary Stent Intervention;  Surgeon: Troy Sine, MD;  Location: [facility];  Service: Cardiovascular;  Laterality: N/A;  . IR IMAGING GUIDED PORT INSERTION  07/15/2019  . 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 PERIPHERAL VASCULAR CATHETERIZATION Bilateral 2003   "stenting"  . TONSILLECTOMY  ~ 1963    REVIEW OF SYSTEMS:  A comprehensive review of systems was negative except for: Constitutional: positive for fatigue   PHYSICAL EXAMINATION: General appearance: alert, cooperative, appears stated age and no distress Head: Normocephalic, without obvious abnormality, atraumatic Neck: no adenopathy, no JVD, supple, symmetrical, trachea midline and thyroid not enlarged, symmetric, no tenderness/mass/nodules Lymph nodes: Cervical, supraclavicular, and axillary nodes normal. Resp: clear to auscultation bilaterally Back: symmetric, no curvature. ROM normal. No CVA tenderness. Cardio: regular rate and rhythm, S1, S2 normal, no murmur, click, rub or gallop GI: soft, non-tender; bowel sounds normal; no masses,  no organomegaly Extremities: extremities normal, atraumatic, no cyanosis or edema  ECOG PERFORMANCE STATUS: 1 - Symptomatic but completely ambulatory  Blood pressure (!) 148/60, pulse 84, temperature 97.8 F (36.6 C), temperature source Tympanic, resp. rate 18, weight 233 lb 8 oz (105.9 kg), SpO2 99 %.  LABORATORY DATA: Lab Results  Component Value Date   WBC 6.6 06/14/2020   HGB 10.6 (L) 06/14/2020   HCT 33.6 (L) 06/14/2020   MCV 94.6 06/14/2020   PLT 219 06/14/2020      Chemistry      Component Value Date/Time   NA 139 06/14/2020 0859   NA 140 06/08/2015 0840   K 4.1 06/14/2020 0859   CL 106 06/14/2020 0859   CO2 23 06/14/2020 0859   BUN 21 06/14/2020 0859   BUN 15 06/08/2015 0840   CREATININE 1.90 (H) 06/14/2020 0859   CREATININE 1.06 10/01/2016 0855      Component Value Date/Time   CALCIUM 9.5 06/14/2020 0859   ALKPHOS 91 06/28/2020 0819   AST 19 06/28/2020 0819   AST 21 06/14/2020 0859   ALT 12  06/28/2020 0819   ALT 13 06/14/2020 0859   BILITOT 0.2 06/28/2020 0819   BILITOT 0.3 06/14/2020 0859       RADIOGRAPHIC STUDIES: No results found.  ASSESSMENT AND PLAN: This is a very pleasant 65 years old African-American male recently diagnosed with a stage IV (TX, N3, M1c) non-small cell lung cancer, poorly differentiated adenocarcinoma presented with bulky mediastinal as well as right hilar, supraclavicular, abdominal and right axillary lymphadenopathy in addition to metastatic liver lesions diagnosed in January 2021. Molecular studies by guardant 360 shows no actionable mutations.  The patient is not a candidate for treatment with targeted therapy or enrollment in the clinical trial with the [facility] regimen. The patient is currently undergoing systemic chemotherapy with carboplatin for AUC of 5, Alimta 500 mg/M2 and Keytruda 200 mg IV every 3 weeks status post 17 cycles. Starting from cycle #5 he is treated with maintenance Alimta and Keytruda every 3 weeks.  Starting from cycle #12 the patient has been on treatment with single agent Keytruda because of the renal insufficiency. The patient continues to tolerate his treatment with Keytruda fairly well with no concerning adverse effects. I recommended for him to proceed with cycle #18 today as planned. He will come back for follow-up visit in 3 weeks for evaluation before the next cycle of his treatment. I will arrange for the patient to have imaging studies after cycle #19. For the anemia his hemoglobin and hematocrit are better.  He was advised to increase his iron rich diet. He was advised to call immediately if he has any other concerning symptoms in the interval. The patient voices understanding of current disease status and treatment options and is in agreement with the current care plan.  All questions were answered. The patient knows to call the clinic with any problems, questions or concerns. We can certainly see the patient much  sooner if necessary.  Disclaimer: This note was dictated with voice recognition software. Similar sounding words can inadvertently be transcribed and may not be corrected upon review.

## 2020-07-05 NOTE — Patient Instructions (Signed)
[facility] Discharge Instructions for Patients Receiving Chemotherapy  Today you received the following chemotherapy agents: Pembrolizumab Beryle Flock)  To help prevent nausea and vomiting after your treatment, we encourage you to take your nausea medication  as prescribed.    If you develop nausea and vomiting that is not controlled by your nausea medication, call the clinic.   BELOW ARE SYMPTOMS THAT SHOULD BE REPORTED IMMEDIATELY:  *FEVER GREATER THAN 100.5 F  *CHILLS WITH OR WITHOUT FEVER  NAUSEA AND VOMITING THAT IS NOT CONTROLLED WITH YOUR NAUSEA MEDICATION  *UNUSUAL SHORTNESS OF BREATH  *UNUSUAL BRUISING OR BLEEDING  TENDERNESS IN MOUTH AND THROAT WITH OR WITHOUT PRESENCE OF ULCERS  *URINARY PROBLEMS  *BOWEL PROBLEMS  UNUSUAL RASH Items with * indicate a potential emergency and should be followed up as soon as possible.  Feel free to call the clinic should you have any questions or concerns. The clinic phone number is [phone].  Please show the [facility] at check-in to the Emergency Department and triage nurse.

## 2020-07-05 NOTE — Progress Notes (Signed)
Per Dr [name] Cr is always elevated. It is ok to treat/infusion with elevated CR.

## 2020-07-07 ENCOUNTER — Ambulatory Visit: Payer: Medicare Other | Admitting: Family Medicine

## 2020-07-07 ENCOUNTER — Telehealth: Payer: Self-pay | Admitting: Internal Medicine

## 2020-07-07 NOTE — Telephone Encounter (Signed)
Scheduled per 2/9 los. Pt will receive an updated appt calendar per next visit appt notes

## 2020-07-13 ENCOUNTER — Telehealth: Payer: Self-pay

## 2020-07-13 NOTE — Telephone Encounter (Signed)
Pt called stating he has been feeling achy for a few days and has taken 2 home COVID tests and they came back "with a little pink, so I'm going to go to [facility] to get a test, what do you think I should do?". I advised the pt he definitely should get tested and if he is not able to get a test there he can be tested at a [facility] location. I offered the number for him to call but he wants to go to [facility]. I advised the pt to let us know what his results are when they come in. Pt expressed understanding of this information.

## 2020-07-25 DIAGNOSIS — R809 Proteinuria, unspecified: Secondary | ICD-10-CM

## 2020-07-25 DIAGNOSIS — R7989 Other specified abnormal findings of blood chemistry: Secondary | ICD-10-CM

## 2020-07-25 DIAGNOSIS — E559 Vitamin D deficiency, unspecified: Secondary | ICD-10-CM

## 2020-07-25 HISTORY — DX: Other specified abnormal findings of blood chemistry: R79.89

## 2020-07-25 HISTORY — DX: Vitamin D deficiency, unspecified: E55.9

## 2020-07-25 HISTORY — DX: Proteinuria, unspecified: R80.9

## 2020-07-26 ENCOUNTER — Inpatient Hospital Stay (HOSPITAL_BASED_OUTPATIENT_CLINIC_OR_DEPARTMENT_OTHER): Payer: Medicare Other | Admitting: Internal Medicine

## 2020-07-26 ENCOUNTER — Inpatient Hospital Stay: Payer: Medicare Other

## 2020-07-26 ENCOUNTER — Other Ambulatory Visit: Payer: Self-pay

## 2020-07-26 ENCOUNTER — Inpatient Hospital Stay: Payer: Medicare Other | Attending: Internal Medicine

## 2020-07-26 VITALS — BP 148/63 | HR 85 | Temp 97.0°F | Resp 18 | Ht 75.0 in | Wt 231.7 lb

## 2020-07-26 DIAGNOSIS — Z7984 Long term (current) use of oral hypoglycemic drugs: Secondary | ICD-10-CM | POA: Diagnosis not present

## 2020-07-26 DIAGNOSIS — I2510 Atherosclerotic heart disease of native coronary artery without angina pectoris: Secondary | ICD-10-CM | POA: Diagnosis not present

## 2020-07-26 DIAGNOSIS — C3490 Malignant neoplasm of unspecified part of unspecified bronchus or lung: Secondary | ICD-10-CM | POA: Diagnosis not present

## 2020-07-26 DIAGNOSIS — Z5112 Encounter for antineoplastic immunotherapy: Secondary | ICD-10-CM | POA: Insufficient documentation

## 2020-07-26 DIAGNOSIS — Z7982 Long term (current) use of aspirin: Secondary | ICD-10-CM | POA: Diagnosis not present

## 2020-07-26 DIAGNOSIS — M1990 Unspecified osteoarthritis, unspecified site: Secondary | ICD-10-CM | POA: Insufficient documentation

## 2020-07-26 DIAGNOSIS — I252 Old myocardial infarction: Secondary | ICD-10-CM | POA: Diagnosis not present

## 2020-07-26 DIAGNOSIS — N289 Disorder of kidney and ureter, unspecified: Secondary | ICD-10-CM | POA: Diagnosis not present

## 2020-07-26 DIAGNOSIS — R5383 Other fatigue: Secondary | ICD-10-CM | POA: Insufficient documentation

## 2020-07-26 DIAGNOSIS — E785 Hyperlipidemia, unspecified: Secondary | ICD-10-CM | POA: Insufficient documentation

## 2020-07-26 DIAGNOSIS — E1151 Type 2 diabetes mellitus with diabetic peripheral angiopathy without gangrene: Secondary | ICD-10-CM | POA: Diagnosis not present

## 2020-07-26 DIAGNOSIS — Z95828 Presence of other vascular implants and grafts: Secondary | ICD-10-CM

## 2020-07-26 DIAGNOSIS — Z79899 Other long term (current) drug therapy: Secondary | ICD-10-CM | POA: Insufficient documentation

## 2020-07-26 DIAGNOSIS — I10 Essential (primary) hypertension: Secondary | ICD-10-CM | POA: Insufficient documentation

## 2020-07-26 DIAGNOSIS — C3491 Malignant neoplasm of unspecified part of right bronchus or lung: Secondary | ICD-10-CM

## 2020-07-26 DIAGNOSIS — D638 Anemia in other chronic diseases classified elsewhere: Secondary | ICD-10-CM | POA: Insufficient documentation

## 2020-07-26 LAB — CBC WITH DIFFERENTIAL (CANCER CENTER ONLY)
Abs Immature Granulocytes: 0.01 10*3/uL (ref 0.00–0.07)
Basophils Absolute: 0.1 10*3/uL (ref 0.0–0.1)
Basophils Relative: 1 %
Eosinophils Absolute: 0.2 10*3/uL (ref 0.0–0.5)
Eosinophils Relative: 3 %
HCT: 33.1 % — ABNORMAL LOW (ref 39.0–52.0)
Hemoglobin: 10.9 g/dL — ABNORMAL LOW (ref 13.0–17.0)
Immature Granulocytes: 0 %
Lymphocytes Relative: 50 %
Lymphs Abs: 3.2 10*3/uL (ref 0.7–4.0)
MCH: 29.5 pg (ref 26.0–34.0)
MCHC: 32.9 g/dL (ref 30.0–36.0)
MCV: 89.7 fL (ref 80.0–100.0)
Monocytes Absolute: 0.6 10*3/uL (ref 0.1–1.0)
Monocytes Relative: 10 %
Neutro Abs: 2.3 10*3/uL (ref 1.7–7.7)
Neutrophils Relative %: 36 %
Platelet Count: 228 10*3/uL (ref 150–400)
RBC: 3.69 MIL/uL — ABNORMAL LOW (ref 4.22–5.81)
RDW: 13.7 % (ref 11.5–15.5)
WBC Count: 6.3 10*3/uL (ref 4.0–10.5)
nRBC: 0 % (ref 0.0–0.2)

## 2020-07-26 LAB — CMP (CANCER CENTER ONLY)
ALT: 16 U/L (ref 0–44)
AST: 19 U/L (ref 15–41)
Albumin: 3.9 g/dL (ref 3.5–5.0)
Alkaline Phosphatase: 82 U/L (ref 38–126)
Anion gap: 10 (ref 5–15)
BUN: 25 mg/dL — ABNORMAL HIGH (ref 8–23)
CO2: 23 mmol/L (ref 22–32)
Calcium: 9.4 mg/dL (ref 8.9–10.3)
Chloride: 105 mmol/L (ref 98–111)
Creatinine: 1.91 mg/dL — ABNORMAL HIGH (ref 0.61–1.24)
GFR, Estimated: 38 mL/min — ABNORMAL LOW (ref >60)
Glucose, Bld: 213 mg/dL — ABNORMAL HIGH (ref 70–99)
Potassium: 3.8 mmol/L (ref 3.5–5.1)
Sodium: 138 mmol/L (ref 135–145)
Total Bilirubin: 0.2 mg/dL — ABNORMAL LOW (ref 0.3–1.2)
Total Protein: 8.5 g/dL — ABNORMAL HIGH (ref 6.5–8.1)

## 2020-07-26 LAB — TSH: TSH: 1.421 u[IU]/mL (ref 0.320–4.118)

## 2020-07-26 MED ORDER — SODIUM CHLORIDE 0.9% FLUSH
10.0000 mL | INTRAVENOUS | Status: DC | PRN
  Administered 2020-07-26: 10 mL
  Filled 2020-07-26: qty 10

## 2020-07-26 MED ORDER — SODIUM CHLORIDE 0.9 % IV SOLN
Freq: Once | INTRAVENOUS | Status: AC
  Filled 2020-07-26: qty 250

## 2020-07-26 MED ORDER — SODIUM CHLORIDE 0.9 % IV SOLN
200.0000 mg | Freq: Once | INTRAVENOUS | Status: AC
  Administered 2020-07-26: 200 mg via INTRAVENOUS
  Filled 2020-07-26: qty 8

## 2020-07-26 MED ORDER — SODIUM CHLORIDE 0.9% FLUSH
10.0000 mL | Freq: Once | INTRAVENOUS | Status: AC
  Administered 2020-07-26: 10 mL
  Filled 2020-07-26: qty 10

## 2020-07-26 MED ORDER — HEPARIN SOD (PORK) LOCK FLUSH 100 UNIT/ML IV SOLN
500.0000 [IU] | Freq: Once | INTRAVENOUS | Status: AC | PRN
  Administered 2020-07-26: 500 [IU]
  Filled 2020-07-26: qty 5

## 2020-07-26 NOTE — Patient Instructions (Signed)
[facility] Discharge Instructions for Patients Receiving Chemotherapy  Today you received the following chemotherapy agents: Pembrolizumab Beryle Flock)  To help prevent nausea and vomiting after your treatment, we encourage you to take your nausea medication  as prescribed.    If you develop nausea and vomiting that is not controlled by your nausea medication, call the clinic.   BELOW ARE SYMPTOMS THAT SHOULD BE REPORTED IMMEDIATELY:  *FEVER GREATER THAN 100.5 F  *CHILLS WITH OR WITHOUT FEVER  NAUSEA AND VOMITING THAT IS NOT CONTROLLED WITH YOUR NAUSEA MEDICATION  *UNUSUAL SHORTNESS OF BREATH  *UNUSUAL BRUISING OR BLEEDING  TENDERNESS IN MOUTH AND THROAT WITH OR WITHOUT PRESENCE OF ULCERS  *URINARY PROBLEMS  *BOWEL PROBLEMS  UNUSUAL RASH Items with * indicate a potential emergency and should be followed up as soon as possible.  Feel free to call the clinic should you have any questions or concerns. The clinic phone number is [phone].  Please show the [facility] at check-in to the Emergency Department and triage nurse.

## 2020-07-26 NOTE — Progress Notes (Signed)
Per Dr. [name], ok to proceed with Scr 1.91.

## 2020-07-26 NOTE — Patient Instructions (Signed)

## 2020-07-26 NOTE — Progress Notes (Signed)
[facility] [phone]   [phone]  OFFICE PROGRESS NOTE  [name], FNP [name] [address]  DIAGNOSIS: Stage IV (TX, N3, M1c) non-small cell lung cancer, poorly differentiated adenocarcinoma presented with bulky mediastinal lymphadenopathy in addition to right axillary lymphadenopathy and metastatic liver lesions, abdominal lymphadenopathy diagnosed in January 2021. Molecular studies by [employer] 360 that shows no actionable mutations.  PRIOR THERAPY: None  CURRENT THERAPY: Systemic chemotherapy with carboplatin for AUC of 5, Alimta 500 mg/M2 and Keytruda 200 mg IV every 3 weeks.  First dose July 14, 2019.  Status post 18 cycles. Starting from cycle #5 the patient will be treated with maintenance Alimta and Keytruda every 3 weeks.  Starting from cycle #12 he will be on single agent treatment with Keytruda secondary to renal insufficiency.  INTERVAL HISTORY: Kirk [name] 65 y.o. male returns to the clinic today for follow-up visit.  The patient is feeling fine today with no concerning complaints except for arthralgia mainly in the shoulders.  He denied having any chest pain, shortness of breath, cough or hemoptysis.  He denied having any fever or chills.  He has no nausea, vomiting, diarrhea or constipation.  He has no headache or visual changes.  He continues to tolerate his treatment with maintenance Keytruda fairly well.  The patient is here today for evaluation before starting cycle #19.  MEDICAL HISTORY: Past Medical History:  Diagnosis Date  . Arthritis    "right knee" (09/04/2015)  . Atrial flutter with rapid ventricular response ([facility]) 04/19/2019  . Dizziness 10/2019  . Dyspnea    increased exertion  . HTN (hypertension)   . Hyperlipidemia   . Lung cancer ([facility]) 05/2019  . NSTEMI (non-ST elevated myocardial infarction) ([facility]) 09/04/2015   a. cath 09/05/2015: 95% stenosis mid-LAD, 55% mid-RCA, and 40%  prox-RCA. PCI performed w/ Synergy DES to LAD  . PAD (peripheral artery disease) (H[facility])    Stenting of bilateral iliacs in 2003.  [name] Refusal of blood transfusions as patient is Jehovah's Witness   . Type II diabetes mellitus (HCC)     ALLERGIES:  has No Known Allergies.  MEDICATIONS:  Current Outpatient Medications  Medication Sig Dispense Refill  . acetaminophen (TYLENOL) 500 MG tablet Take 500 mg by mouth every 6 (six) hours as needed for moderate pain or fever.     [name] aspirin EC 81 MG tablet Take 81 mg by mouth at bedtime.    . carvedilol (COREG) 12.5 MG tablet Take 1 tablet (12.5 mg total) by mouth 2 (two) times daily with a meal. 180 tablet 3  . cetirizine (ZYRTEC) 10 MG tablet Take 1 tablet (10 mg total) by mouth daily. 90 tablet 3  . clopidogrel (PLAVIX) 75 MG tablet Take 1 tablet (75 mg total) by mouth daily. 90 tablet 3  . docusate sodium (COLACE) 100 MG capsule Take 1 capsule (100 mg total) by mouth 2 (two) times daily as needed for mild constipation. 60 capsule 11  . ferrous sulfate 324 MG TBEC Take 1 tablet (324 mg total) by mouth daily with breakfast. 30 tablet 11  . folic acid (FOLVITE) 1 MG tablet Take 1 tablet (1 mg total) by mouth daily. 90 tablet 3  . furosemide (LASIX) 20 MG tablet Take 1 tablet (20 mg total) by mouth 2 (two) times daily. 60 tablet 1  . gabapentin (NEURONTIN) 300 MG capsule Take 1 capsule (300 mg total) by mouth 3 (three) times daily.  270 capsule 3  . glimepiride (AMARYL) 4 MG tablet Take 1 tablet (4 mg total) by mouth daily. 90 tablet 3  . Hydrocortisone Acetate 1 % CREA Apply 15 g topically 2 (two) times daily. 15 g 3  . ipratropium (ATROVENT) 0.03 % nasal spray USE 2 SPRAYS IN EACH NOSTRIL TWICE DAILY (Patient taking differently: Place 1 spray into both nostrils daily as needed (allergies).) 30 mL 2  . lactulose (CHRONULAC) 10 GM/15ML solution Take 30 mLs (20 g total) by mouth 2 (two) times daily as needed for moderate constipation. 240 mL 11  .  lidocaine-prilocaine (EMLA) cream APPLY TO THE PORT-A-CATH SITE 30-60 MINUTES BEFORE TREATMENT. (Patient taking differently: Apply 1 application topically daily as needed (access port). Apply to the Port-A-Cath site 30-60 minutes before treatment.) 30 g 0  . meclizine (ANTIVERT) 25 MG tablet Take 1 tablet (25 mg total) by mouth 3 (three) times daily as needed for dizziness. 90 tablet 3  . metFORMIN (GLUCOPHAGE) 1000 MG tablet Take 1 tablet (1,000 mg total) by mouth 2 (two) times daily with a meal. 180 tablet 3  . multivitamin (ONE-A-DAY MEN'S) TABS tablet Take 1 tablet by mouth daily. 30 tablet 6  . nitroGLYCERIN (NITROSTAT) 0.4 MG SL tablet Place 1 tablet (0.4 mg total) under the tongue every 5 (five) minutes x 3 doses as needed for chest pain. 25 tablet 2  . oxyCODONE-acetaminophen (PERCOCET/ROXICET) 5-325 MG tablet Take 1 tablet by mouth every 6 (six) hours as needed for severe pain. 20 tablet 0  . pantoprazole (PROTONIX) 40 MG tablet Take 1 tablet (40 mg total) by mouth daily. 90 tablet 3  . prochlorperazine (COMPAZINE) 10 MG tablet Take 1 tablet (10 mg total) by mouth every 6 (six) hours as needed for nausea or vomiting. 30 tablet 11  . rosuvastatin (CRESTOR) 40 MG tablet Take 1 tablet (40 mg total) by mouth daily. 90 tablet 3   No current facility-administered medications for this visit.    SURGICAL HISTORY:  Past Surgical History:  Procedure Laterality Date  . CARDIAC CATHETERIZATION N/A 09/05/2015   Procedure: Left Heart Cath and Coronary Angiography;  Surgeon: Troy Sine, MD;  Location: [facility];  Service: Cardiovascular;  Laterality: N/A;  . CARDIAC CATHETERIZATION N/A 09/05/2015   Procedure: Coronary Stent Intervention;  Surgeon: Troy Sine, MD;  Location: [facility];  Service: Cardiovascular;  Laterality: N/A;  . IR IMAGING GUIDED PORT INSERTION  07/15/2019  . 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 PERIPHERAL VASCULAR  CATHETERIZATION Bilateral 2003   "stenting"  . TONSILLECTOMY  ~ 1963    REVIEW OF SYSTEMS:  A comprehensive review of systems was negative except for: Constitutional: positive for fatigue Musculoskeletal: positive for arthralgias   PHYSICAL EXAMINATION: General appearance: alert, cooperative, appears stated age, fatigued and no distress Head: Normocephalic, without obvious abnormality, atraumatic Neck: no adenopathy, no JVD, supple, symmetrical, trachea midline and thyroid not enlarged, symmetric, no tenderness/mass/nodules Lymph nodes: Cervical, supraclavicular, and axillary nodes normal. Resp: clear to auscultation bilaterally Back: symmetric, no curvature. ROM normal. No CVA tenderness. Cardio: regular rate and rhythm, S1, S2 normal, no murmur, click, rub or gallop GI: soft, non-tender; bowel sounds normal; no masses,  no organomegaly Extremities: extremities normal, atraumatic, no cyanosis or edema  ECOG PERFORMANCE STATUS: 1 - Symptomatic but completely ambulatory  Blood pressure (!) 148/63, pulse 85, temperature (!) 97 F (36.1 C), temperature source Tympanic, resp. rate 18, height 6'  3" (1.905 m), weight 231 lb 11.2 oz (105.1 kg), SpO2 97 %.  LABORATORY DATA: Lab Results  Component Value Date   WBC 6.3 07/26/2020   HGB 10.9 (L) 07/26/2020   HCT 33.1 (L) 07/26/2020   MCV 89.7 07/26/2020   PLT 228 07/26/2020      Chemistry      Component Value Date/Time   NA 139 07/05/2020 0850   NA 140 06/08/2015 0840   K 4.1 07/05/2020 0850   CL 107 07/05/2020 0850   CO2 24 07/05/2020 0850   BUN 22 07/05/2020 0850   BUN 15 06/08/2015 0840   CREATININE 1.73 (H) 07/05/2020 0850   CREATININE 1.06 10/01/2016 0855      Component Value Date/Time   CALCIUM 9.5 07/05/2020 0850   ALKPHOS 78 07/05/2020 0850   AST 20 07/05/2020 0850   ALT 15 07/05/2020 0850   BILITOT 0.3 07/05/2020 0850       RADIOGRAPHIC STUDIES: No results found.  ASSESSMENT AND PLAN: This is a very pleasant  65 years old African-American male recently diagnosed with a stage IV (TX, N3, M1c) non-small cell lung cancer, poorly differentiated adenocarcinoma presented with bulky mediastinal as well as right hilar, supraclavicular, abdominal and right axillary lymphadenopathy in addition to metastatic liver lesions diagnosed in January 2021. Molecular studies by guardant 360 shows no actionable mutations.  The patient is not a candidate for treatment with targeted therapy or enrollment in the clinical trial with the [facility] regimen. The patient is currently undergoing systemic chemotherapy with carboplatin for AUC of 5, Alimta 500 mg/M2 and Keytruda 200 mg IV every 3 weeks status post 18 cycles. Starting from cycle #5 he is treated with maintenance Alimta and Keytruda every 3 weeks.  Starting from cycle #12 the patient has been on treatment with single agent Keytruda because of the renal insufficiency. He has been tolerating his treatment well with no concerning adverse effects. I recommended for him to proceed with cycle #19 today as planned. I will see him back for follow-up visit in 3 weeks for evaluation with repeat CT scan of the chest, abdomen pelvis without contrast for restaging of his disease. For the anemia of chronic disease, his hemoglobin and hematocrit continues to improve and we will continue to monitor for now. For the renal sufficiency he may need referral from his primary care physician to nephrology for evaluation. He was advised to call immediately if he has any other concerning symptoms in the interval. The patient voices understanding of current disease status and treatment options and is in agreement with the current care plan.  All questions were answered. The patient knows to call the clinic with any problems, questions or concerns. We can certainly see the patient much sooner if necessary.  Disclaimer: This note was dictated with voice recognition software. Similar sounding words can  inadvertently be transcribed and may not be corrected upon review.

## 2020-07-27 ENCOUNTER — Telehealth: Payer: Self-pay | Admitting: Internal Medicine

## 2020-07-27 ENCOUNTER — Telehealth: Payer: Self-pay

## 2020-07-27 LAB — HM DIABETES EYE EXAM

## 2020-07-27 NOTE — Telephone Encounter (Signed)
Scheduled per los. Called and left msg. Mailed printout  °

## 2020-07-27 NOTE — Telephone Encounter (Signed)
Pt states that you will have receive some paper work (from other doctor)and is asking to see a kidney specialist?

## 2020-07-31 ENCOUNTER — Other Ambulatory Visit: Payer: Self-pay

## 2020-07-31 ENCOUNTER — Other Ambulatory Visit: Payer: Self-pay | Admitting: Family Medicine

## 2020-07-31 ENCOUNTER — Other Ambulatory Visit: Payer: Medicare Other

## 2020-07-31 ENCOUNTER — Telehealth: Payer: Self-pay | Admitting: Family Medicine

## 2020-07-31 DIAGNOSIS — R7989 Other specified abnormal findings of blood chemistry: Secondary | ICD-10-CM

## 2020-07-31 DIAGNOSIS — M6281 Muscle weakness (generalized): Secondary | ICD-10-CM

## 2020-07-31 DIAGNOSIS — E559 Vitamin D deficiency, unspecified: Secondary | ICD-10-CM

## 2020-07-31 DIAGNOSIS — E538 Deficiency of other specified B group vitamins: Secondary | ICD-10-CM

## 2020-08-01 LAB — VITAMIN D 25 HYDROXY (VIT D DEFICIENCY, FRACTURES): Vit D, 25-Hydroxy: 16.6 ng/mL — ABNORMAL LOW (ref 30.0–100.0)

## 2020-08-01 LAB — MAGNESIUM: Magnesium: 1.9 mg/dL (ref 1.6–2.3)

## 2020-08-01 LAB — VITAMIN B12: Vitamin B-12: 473 pg/mL (ref 232–1245)

## 2020-08-02 ENCOUNTER — Encounter: Payer: Self-pay | Admitting: Family Medicine

## 2020-08-02 ENCOUNTER — Other Ambulatory Visit: Payer: Self-pay | Admitting: Family Medicine

## 2020-08-02 DIAGNOSIS — E559 Vitamin D deficiency, unspecified: Secondary | ICD-10-CM

## 2020-08-02 DIAGNOSIS — R809 Proteinuria, unspecified: Secondary | ICD-10-CM

## 2020-08-02 DIAGNOSIS — R7989 Other specified abnormal findings of blood chemistry: Secondary | ICD-10-CM

## 2020-08-02 LAB — MICROALBUMIN / CREATININE URINE RATIO
Creatinine, Urine: 78.6 mg/dL
Microalb/Creat Ratio: 182 mg/g creat — ABNORMAL HIGH (ref 0–29)
Microalbumin, Urine: 143.2 ug/mL

## 2020-08-02 MED ORDER — VITAMIN D (ERGOCALCIFEROL) 1.25 MG (50000 UNIT) PO CAPS: 50000.0000 [IU] | ORAL_CAPSULE | ORAL | 3 refills | Status: DC

## 2020-08-14 ENCOUNTER — Other Ambulatory Visit: Payer: Self-pay

## 2020-08-14 ENCOUNTER — Ambulatory Visit (HOSPITAL_COMMUNITY)
Admission: RE | Admit: 2020-08-14 | Discharge: 2020-08-14 | Disposition: A | Discharge status: Home / Self Care | Payer: Medicare Other | Source: Ambulatory Visit | Attending: Internal Medicine | Admitting: Internal Medicine

## 2020-08-14 DIAGNOSIS — C3490 Malignant neoplasm of unspecified part of unspecified bronchus or lung: Secondary | ICD-10-CM | POA: Insufficient documentation

## 2020-08-16 ENCOUNTER — Inpatient Hospital Stay: Payer: Medicare Other

## 2020-08-16 ENCOUNTER — Other Ambulatory Visit: Payer: Self-pay

## 2020-08-16 ENCOUNTER — Encounter: Payer: Self-pay | Admitting: Internal Medicine

## 2020-08-16 ENCOUNTER — Inpatient Hospital Stay (HOSPITAL_BASED_OUTPATIENT_CLINIC_OR_DEPARTMENT_OTHER): Payer: Medicare Other | Admitting: Internal Medicine

## 2020-08-16 VITALS — BP 147/68 | HR 85 | Temp 98.0°F | Resp 18 | Wt 229.1 lb

## 2020-08-16 DIAGNOSIS — Z95828 Presence of other vascular implants and grafts: Secondary | ICD-10-CM

## 2020-08-16 DIAGNOSIS — C3491 Malignant neoplasm of unspecified part of right bronchus or lung: Secondary | ICD-10-CM | POA: Diagnosis not present

## 2020-08-16 DIAGNOSIS — Z5112 Encounter for antineoplastic immunotherapy: Secondary | ICD-10-CM

## 2020-08-16 DIAGNOSIS — I2510 Atherosclerotic heart disease of native coronary artery without angina pectoris: Secondary | ICD-10-CM

## 2020-08-16 DIAGNOSIS — C3490 Malignant neoplasm of unspecified part of unspecified bronchus or lung: Secondary | ICD-10-CM

## 2020-08-16 DIAGNOSIS — R5383 Other fatigue: Secondary | ICD-10-CM

## 2020-08-16 DIAGNOSIS — C787 Secondary malignant neoplasm of liver and intrahepatic bile duct: Secondary | ICD-10-CM | POA: Diagnosis not present

## 2020-08-16 LAB — CMP (CANCER CENTER ONLY)
ALT: 17 U/L (ref 0–44)
AST: 18 U/L (ref 15–41)
Albumin: 3.8 g/dL (ref 3.5–5.0)
Alkaline Phosphatase: 80 U/L (ref 38–126)
Anion gap: 12 (ref 5–15)
BUN: 28 mg/dL — ABNORMAL HIGH (ref 8–23)
CO2: 22 mmol/L (ref 22–32)
Calcium: 9.3 mg/dL (ref 8.9–10.3)
Chloride: 105 mmol/L (ref 98–111)
Creatinine: 2.28 mg/dL — ABNORMAL HIGH (ref 0.61–1.24)
GFR, Estimated: 31 mL/min — ABNORMAL LOW (ref >60)
Glucose, Bld: 312 mg/dL — ABNORMAL HIGH (ref 70–99)
Potassium: 3.9 mmol/L (ref 3.5–5.1)
Sodium: 139 mmol/L (ref 135–145)
Total Bilirubin: 0.3 mg/dL (ref 0.3–1.2)
Total Protein: 8.3 g/dL — ABNORMAL HIGH (ref 6.5–8.1)

## 2020-08-16 LAB — CBC WITH DIFFERENTIAL (CANCER CENTER ONLY)
Abs Immature Granulocytes: 0.01 10*3/uL (ref 0.00–0.07)
Basophils Absolute: 0.1 10*3/uL (ref 0.0–0.1)
Basophils Relative: 1 %
Eosinophils Absolute: 0.2 10*3/uL (ref 0.0–0.5)
Eosinophils Relative: 3 %
HCT: 33.6 % — ABNORMAL LOW (ref 39.0–52.0)
Hemoglobin: 10.9 g/dL — ABNORMAL LOW (ref 13.0–17.0)
Immature Granulocytes: 0 %
Lymphocytes Relative: 44 %
Lymphs Abs: 3.1 10*3/uL (ref 0.7–4.0)
MCH: 29 pg (ref 26.0–34.0)
MCHC: 32.4 g/dL (ref 30.0–36.0)
MCV: 89.4 fL (ref 80.0–100.0)
Monocytes Absolute: 0.7 10*3/uL (ref 0.1–1.0)
Monocytes Relative: 10 %
Neutro Abs: 2.9 10*3/uL (ref 1.7–7.7)
Neutrophils Relative %: 42 %
Platelet Count: 236 10*3/uL (ref 150–400)
RBC: 3.76 MIL/uL — ABNORMAL LOW (ref 4.22–5.81)
RDW: 13.7 % (ref 11.5–15.5)
WBC Count: 6.9 10*3/uL (ref 4.0–10.5)
nRBC: 0 % (ref 0.0–0.2)

## 2020-08-16 LAB — TSH: TSH: 1.358 u[IU]/mL (ref 0.320–4.118)

## 2020-08-16 MED ORDER — SODIUM CHLORIDE 0.9% FLUSH
10.0000 mL | Freq: Once | INTRAVENOUS | Status: AC
  Administered 2020-08-16: 10 mL
  Filled 2020-08-16: qty 10

## 2020-08-16 MED ORDER — SODIUM CHLORIDE 0.9% FLUSH
10.0000 mL | INTRAVENOUS | Status: DC | PRN
  Administered 2020-08-16: 10 mL
  Filled 2020-08-16: qty 10

## 2020-08-16 MED ORDER — HEPARIN SOD (PORK) LOCK FLUSH 100 UNIT/ML IV SOLN
500.0000 [IU] | Freq: Once | INTRAVENOUS | Status: AC | PRN
  Administered 2020-08-16: 500 [IU]
  Filled 2020-08-16: qty 5

## 2020-08-16 MED ORDER — SODIUM CHLORIDE 0.9 % IV SOLN
200.0000 mg | Freq: Once | INTRAVENOUS | Status: AC
  Administered 2020-08-16: 200 mg via INTRAVENOUS
  Filled 2020-08-16: qty 8

## 2020-08-16 MED ORDER — SODIUM CHLORIDE 0.9 % IV SOLN
Freq: Once | INTRAVENOUS | Status: AC
  Filled 2020-08-16: qty 250

## 2020-08-16 NOTE — Progress Notes (Signed)
Kirk [name] [phone]   [phone]  OFFICE PROGRESS NOTE  Kirk Glatter, FNP [name] [address]  DIAGNOSIS: Stage IV (TX, N3, M1c) non-small cell lung cancer, poorly differentiated adenocarcinoma presented with bulky mediastinal lymphadenopathy in addition to right axillary lymphadenopathy and metastatic liver lesions, abdominal lymphadenopathy diagnosed in January 2021. Molecular studies by [employer] that shows no actionable mutations.  PRIOR THERAPY: None  CURRENT THERAPY: Systemic chemotherapy with carboplatin for AUC of 5, Alimta 500 mg/M2 and Keytruda 200 mg IV every 3 weeks.  First dose July 14, 2019.  Status post 19 cycles. Starting from cycle #5 the patient will be treated with maintenance Alimta and Keytruda every 3 weeks.  Starting from cycle #12 he will be on single agent treatment with Keytruda secondary to renal insufficiency.  INTERVAL HISTORY: Kirk [name] 65 y.o. male returns to the clinic today for follow-up visit.  The patient is feeling fine today with no concerning complaints except for mild fatigue.  He denied having any chest pain, shortness of breath, cough or hemoptysis.  He denied having any fever or chills.  He has no nausea, vomiting, diarrhea or constipation.  He has no headache or visual changes.  He is not taking the oral iron tablets regularly because of constipation.  He had repeat CT scan of the chest, abdomen pelvis performed recently and he is here for evaluation and discussion of his discuss results.  MEDICAL HISTORY: Past Medical History:  Diagnosis Date  . Arthritis    "right knee" (09/04/2015)  . Atrial flutter with rapid ventricular response ([facility]) 04/19/2019  . Dizziness 10/2019  . Dyspnea    increased exertion  . Elevated serum creatinine 07/2020  . HTN (hypertension)   . Hyperlipidemia   . Lung cancer ([facility]) 05/2019  . Microalbuminuria 07/2020  . NSTEMI (non-ST  elevated myocardial infarction) ([facility]) 09/04/2015   a. cath 09/05/2015: 95% stenosis mid-LAD, 55% mid-RCA, and 40% prox-RCA. PCI performed w/ Synergy DES to LAD  . PAD (peripheral artery disease) ([facility])    Stenting of bilateral iliacs in 2003.  [name] Refusal of blood transfusions as patient is Jehovah's Witness   . Type II diabetes mellitus ([facility])   . Vitamin D deficiency 07/2020    ALLERGIES:  has No Known Allergies.  MEDICATIONS:  Current Outpatient Medications  Medication Sig Dispense Refill  . acetaminophen (TYLENOL) 500 MG tablet Take 500 mg by mouth every 6 (six) hours as needed for moderate pain or fever.     [name] aspirin EC 81 MG tablet Take 81 mg by mouth at bedtime.    . carvedilol (COREG) 12.5 MG tablet Take 1 tablet (12.5 mg total) by mouth 2 (two) times daily with a meal. 180 tablet 3  . cetirizine (ZYRTEC) 10 MG tablet Take 1 tablet (10 mg total) by mouth daily. 90 tablet 3  . clopidogrel (PLAVIX) 75 MG tablet Take 1 tablet (75 mg total) by mouth daily. 90 tablet 3  . docusate sodium (COLACE) 100 MG capsule Take 1 capsule (100 mg total) by mouth 2 (two) times daily as needed for mild constipation. 60 capsule 11  . ferrous sulfate 324 MG TBEC Take 1 tablet (324 mg total) by mouth daily with breakfast. 30 tablet 11  . folic acid (FOLVITE) 1 MG tablet Take 1 tablet (1 mg total) by mouth daily. 90 tablet 3  . furosemide (LASIX) 20 MG tablet Take 1 tablet (20 mg total) by  mouth 2 (two) times daily. 60 tablet 1  . gabapentin (NEURONTIN) 300 MG capsule Take 1 capsule (300 mg total) by mouth 3 (three) times daily. 270 capsule 3  . glimepiride (AMARYL) 4 MG tablet Take 1 tablet (4 mg total) by mouth daily. 90 tablet 3  . Hydrocortisone Acetate 1 % CREA Apply 15 g topically 2 (two) times daily. 15 g 3  . ipratropium (ATROVENT) 0.03 % nasal spray USE 2 SPRAYS IN EACH NOSTRIL TWICE DAILY (Patient taking differently: Place 1 spray into both nostrils daily as needed (allergies).) 30 mL 2  .  lactulose (CHRONULAC) 10 GM/15ML solution Take 30 mLs (20 g total) by mouth 2 (two) times daily as needed for moderate constipation. 240 mL 11  . lidocaine-prilocaine (EMLA) cream APPLY TO THE PORT-A-CATH SITE 30-60 MINUTES BEFORE TREATMENT. (Patient taking differently: Apply 1 application topically daily as needed (access port). Apply to the Port-A-Cath site 30-60 minutes before treatment.) 30 g 0  . meclizine (ANTIVERT) 25 MG tablet Take 1 tablet (25 mg total) by mouth 3 (three) times daily as needed for dizziness. 90 tablet 3  . metFORMIN (GLUCOPHAGE) 1000 MG tablet Take 1 tablet (1,000 mg total) by mouth 2 (two) times daily with a meal. 180 tablet 3  . multivitamin (ONE-A-DAY MEN'S) TABS tablet Take 1 tablet by mouth daily. 30 tablet 6  . nitroGLYCERIN (NITROSTAT) 0.4 MG SL tablet Place 1 tablet (0.4 mg total) under the tongue every 5 (five) minutes x 3 doses as needed for chest pain. 25 tablet 2  . oxyCODONE-acetaminophen (PERCOCET/ROXICET) 5-325 MG tablet Take 1 tablet by mouth every 6 (six) hours as needed for severe pain. 20 tablet 0  . pantoprazole (PROTONIX) 40 MG tablet Take 1 tablet (40 mg total) by mouth daily. 90 tablet 3  . prochlorperazine (COMPAZINE) 10 MG tablet Take 1 tablet (10 mg total) by mouth every 6 (six) hours as needed for nausea or vomiting. 30 tablet 11  . rosuvastatin (CRESTOR) 40 MG tablet Take 1 tablet (40 mg total) by mouth daily. 90 tablet 3  . Vitamin D, Ergocalciferol, (DRISDOL) 1.25 MG (50000 UNIT) CAPS capsule Take 1 capsule (50,000 Units total) by mouth every 7 (seven) days. 5 capsule 3   No current facility-administered medications for this visit.    SURGICAL HISTORY:  Past Surgical History:  Procedure Laterality Date  . CARDIAC CATHETERIZATION N/A 09/05/2015   Procedure: Left Heart Cath and Coronary Angiography;  Surgeon: Troy Sine, MD;  Location: [facility];  Service: Cardiovascular;  Laterality: N/A;  . CARDIAC CATHETERIZATION N/A 09/05/2015    Procedure: Coronary Stent Intervention;  Surgeon: Troy Sine, MD;  Location: [facility];  Service: Cardiovascular;  Laterality: N/A;  . IR IMAGING GUIDED PORT INSERTION  07/15/2019  . 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 PERIPHERAL VASCULAR CATHETERIZATION Bilateral 2003   "stenting"  . TONSILLECTOMY  ~ 1963    REVIEW OF SYSTEMS:  Constitutional: positive for fatigue Eyes: negative Ears, nose, mouth, throat, and face: negative Respiratory: negative Cardiovascular: negative Gastrointestinal: negative Genitourinary:negative Integument/breast: negative Hematologic/lymphatic: negative Musculoskeletal:negative Neurological: negative Behavioral/Psych: negative Endocrine: negative Allergic/Immunologic: negative   PHYSICAL EXAMINATION: General appearance: alert, cooperative, appears stated age, fatigued and no distress Head: Normocephalic, without obvious abnormality, atraumatic Neck: no adenopathy, no JVD, supple, symmetrical, trachea midline and thyroid not enlarged, symmetric, no tenderness/mass/nodules Lymph nodes: Cervical, supraclavicular, and axillary nodes normal. Resp: clear to auscultation bilaterally Back: symmetric, no  curvature. ROM normal. No CVA tenderness. Cardio: regular rate and rhythm, S1, S2 normal, no murmur, click, rub or gallop GI: soft, non-tender; bowel sounds normal; no masses,  no organomegaly Extremities: extremities normal, atraumatic, no cyanosis or edema Neurologic: Alert and oriented X 3, normal strength and tone. Normal symmetric reflexes. Normal coordination and gait  ECOG PERFORMANCE STATUS: 1 - Symptomatic but completely ambulatory  Blood pressure (!) 147/68, pulse 85, temperature 98 F (36.7 C), temperature source Oral, resp. rate 18, weight 229 lb 0.9 oz (103.9 kg), SpO2 98 %.  LABORATORY DATA: Lab Results  Component Value Date   WBC 6.9 08/16/2020   HGB 10.9 (L) 08/16/2020   HCT 33.6 (L) 08/16/2020    MCV 89.4 08/16/2020   PLT 236 08/16/2020      Chemistry      Component Value Date/Time   NA 138 07/26/2020 0834   NA 140 06/08/2015 0840   K 3.8 07/26/2020 0834   CL 105 07/26/2020 0834   CO2 23 07/26/2020 0834   BUN 25 (H) 07/26/2020 0834   BUN 15 06/08/2015 0840   CREATININE 1.91 (H) 07/26/2020 0834   CREATININE 1.06 10/01/2016 0855      Component Value Date/Time   CALCIUM 9.4 07/26/2020 0834   ALKPHOS 82 07/26/2020 0834   AST 19 07/26/2020 0834   ALT 16 07/26/2020 0834   BILITOT 0.2 (L) 07/26/2020 0834       RADIOGRAPHIC STUDIES: CT Abdomen Pelvis Wo Contrast  Result Date: 08/14/2020 CLINICAL DATA:  Stage IV non-small cell lung cancer with ongoing immunotherapy. Restaging. EXAM: CT CHEST, ABDOMEN AND PELVIS WITHOUT CONTRAST TECHNIQUE: Multidetector CT imaging of the chest, abdomen and pelvis was performed following the standard protocol without IV contrast. COMPARISON:  05/15/2020 CT chest, abdomen and pelvis. FINDINGS: CT CHEST FINDINGS Cardiovascular: Normal heart size. Stable trace pericardial effusion/thickening. Left main and 3 vessel coronary atherosclerosis. Right internal jugular Port-A-Cath terminates at the cavoatrial junction. Atherosclerotic nonaneurysmal thoracic aorta. Top-normal caliber main pulmonary artery (3.0 cm diameter), stable. Mediastinum/Nodes: No discrete thyroid nodules. Unremarkable esophagus. Stable mildly enlarged 1.0 cm short axis diameter right axillary lymph node (series 2/image 11). No additional pathologically enlarged axillary nodes. No pathologically enlarged mediastinal nodes. No discrete hilar adenopathy on these noncontrast images. Lungs/Pleura: No pneumothorax. No pleural effusion. Moderate centrilobular emphysema with diffuse bronchial wall thickening. No acute consolidative airspace disease, lung masses or significant pulmonary nodules. Musculoskeletal: No aggressive appearing focal osseous lesions. Moderate thoracic spondylosis. CT  ABDOMEN PELVIS FINDINGS Hepatobiliary: Normal liver size. Hypodense anterior inferior right liver 1.6 x 1.3 cm lesion (series 2/image 69), previously 1.6 x 1.3 cm, stable, previously characterized as a hemangioma. No additional liver lesions. Normal gallbladder with no radiopaque cholelithiasis. No biliary ductal dilatation. Pancreas: Normal, with no mass or duct dilation. Spleen: Normal size. No mass. Adrenals/Urinary Tract: Normal adrenals. No renal stones. No hydronephrosis. No contour deforming renal masses. Normal bladder. Stomach/Bowel: Normal non-distended stomach. Normal caliber small bowel with no small bowel wall thickening. Normal appendix. Oral contrast transits to the right colon. Moderate diffuse colonic stool volume. No large bowel wall thickening, significant diverticulosis or acute pericolonic fat stranding. Vascular/Lymphatic: Atherosclerotic nonaneurysmal abdominal aorta. Mildly enlarged 1.4 cm short axis diameter gastrohepatic ligament node (series 2/image 58), previously 1.4 cm, stable. No new pathologically enlarged lymph nodes in the abdomen or pelvis. Left external iliac 0.8 cm node (series 2/image 110) is stable and within normal limits. Reproductive: Normal size prostate. Other: No pneumoperitoneum, ascites or focal fluid collection. Stable mild  haziness of the central mesenteric fat favoring panniculitis. Musculoskeletal: No aggressive appearing focal osseous lesions. Fixation pins in the left femoral neck appear intact. Mild lumbar spondylosis. IMPRESSION: 1. Stable exam. Stable mild right axillary and gastrohepatic ligament adenopathy. No new or progressive metastatic disease in the chest, abdomen or pelvis. 2. Moderate diffuse colonic stool volume, suggesting constipation. 3. Left main and 3 vessel coronary atherosclerosis. 4. Aortic Atherosclerosis (ICD10-I70.0) and Emphysema (ICD10-J43.9). Electronically Signed   By: Ilona Sorrel M.D.   On: 08/14/2020 13:50   CT Chest Wo  Contrast  Result Date: 08/14/2020 CLINICAL DATA:  Stage IV non-small cell lung cancer with ongoing immunotherapy. Restaging. EXAM: CT CHEST, ABDOMEN AND PELVIS WITHOUT CONTRAST TECHNIQUE: Multidetector CT imaging of the chest, abdomen and pelvis was performed following the standard protocol without IV contrast. COMPARISON:  05/15/2020 CT chest, abdomen and pelvis. FINDINGS: CT CHEST FINDINGS Cardiovascular: Normal heart size. Stable trace pericardial effusion/thickening. Left main and 3 vessel coronary atherosclerosis. Right internal jugular Port-A-Cath terminates at the cavoatrial junction. Atherosclerotic nonaneurysmal thoracic aorta. Top-normal caliber main pulmonary artery (3.0 cm diameter), stable. Mediastinum/Nodes: No discrete thyroid nodules. Unremarkable esophagus. Stable mildly enlarged 1.0 cm short axis diameter right axillary lymph node (series 2/image 11). No additional pathologically enlarged axillary nodes. No pathologically enlarged mediastinal nodes. No discrete hilar adenopathy on these noncontrast images. Lungs/Pleura: No pneumothorax. No pleural effusion. Moderate centrilobular emphysema with diffuse bronchial wall thickening. No acute consolidative airspace disease, lung masses or significant pulmonary nodules. Musculoskeletal: No aggressive appearing focal osseous lesions. Moderate thoracic spondylosis. CT ABDOMEN PELVIS FINDINGS Hepatobiliary: Normal liver size. Hypodense anterior inferior right liver 1.6 x 1.3 cm lesion (series 2/image 69), previously 1.6 x 1.3 cm, stable, previously characterized as a hemangioma. No additional liver lesions. Normal gallbladder with no radiopaque cholelithiasis. No biliary ductal dilatation. Pancreas: Normal, with no mass or duct dilation. Spleen: Normal size. No mass. Adrenals/Urinary Tract: Normal adrenals. No renal stones. No hydronephrosis. No contour deforming renal masses. Normal bladder. Stomach/Bowel: Normal non-distended stomach. Normal caliber  small bowel with no small bowel wall thickening. Normal appendix. Oral contrast transits to the right colon. Moderate diffuse colonic stool volume. No large bowel wall thickening, significant diverticulosis or acute pericolonic fat stranding. Vascular/Lymphatic: Atherosclerotic nonaneurysmal abdominal aorta. Mildly enlarged 1.4 cm short axis diameter gastrohepatic ligament node (series 2/image 58), previously 1.4 cm, stable. No new pathologically enlarged lymph nodes in the abdomen or pelvis. Left external iliac 0.8 cm node (series 2/image 110) is stable and within normal limits. Reproductive: Normal size prostate. Other: No pneumoperitoneum, ascites or focal fluid collection. Stable mild haziness of the central mesenteric fat favoring panniculitis. Musculoskeletal: No aggressive appearing focal osseous lesions. Fixation pins in the left femoral neck appear intact. Mild lumbar spondylosis. IMPRESSION: 1. Stable exam. Stable mild right axillary and gastrohepatic ligament adenopathy. No new or progressive metastatic disease in the chest, abdomen or pelvis. 2. Moderate diffuse colonic stool volume, suggesting constipation. 3. Left main and 3 vessel coronary atherosclerosis. 4. Aortic Atherosclerosis (ICD10-I70.0) and Emphysema (ICD10-J43.9). Electronically Signed   By: Ilona Sorrel M.D.   On: 08/14/2020 13:50    ASSESSMENT AND PLAN: This is a very pleasant 65 years old African-American male recently diagnosed with a stage IV (TX, N3, M1c) non-small cell lung cancer, poorly differentiated adenocarcinoma presented with bulky mediastinal as well as right hilar, supraclavicular, abdominal and right axillary lymphadenopathy in addition to metastatic liver lesions diagnosed in January 2021. Molecular studies by guardant 360 shows no actionable mutations.  The patient is  not a candidate for treatment with targeted therapy or enrollment in the clinical trial with the [facility] regimen. The patient is currently undergoing  systemic chemotherapy with carboplatin for AUC of 5, Alimta 500 mg/M2 and Keytruda 200 mg IV every 3 weeks status post 19 cycles. Starting from cycle #5 he is treated with maintenance Alimta and Keytruda every 3 weeks.  Starting from cycle #12 the patient has been on treatment with single agent Keytruda because of the renal insufficiency. He is tolerating this treatment well with no concerning adverse effect except for mild fatigue. He had repeat CT scan of the chest, abdomen pelvis performed recently.  I personally and independently reviewed the scan and discussed the results with the patient today. His scan showed no concerning findings for disease progression. I recommended for him to continue his current treatment with [facility] and he will proceed with cycle #20 today. The patient will come back for follow-up visit in 3 weeks for evaluation before the next cycle of his treatment. For the anemia of chronic disease this has significantly improved and we will continue to monitor his hemoglobin and hematocrit closely.  I also advised him to take oral iron tablet few times a week. For the chronic renal insufficiency, he is followed by his primary care physician and may need referral to nephrology at some point. The patient was advised to call immediately if he has any concerning symptoms in the interval. The patient voices understanding of current disease status and treatment options and is in agreement with the current care plan.  All questions were answered. The patient knows to call the clinic with any problems, questions or concerns. We can certainly see the patient much sooner if necessary.  Disclaimer: This note was dictated with voice recognition software. Similar sounding words can inadvertently be transcribed and may not be corrected upon review.

## 2020-08-16 NOTE — Patient Instructions (Signed)
[facility] Discharge Instructions for Patients Receiving Chemotherapy  Today you received the following chemotherapy agents:  Keytruda.  To help prevent nausea and vomiting after your treatment, we encourage you to take your nausea medication as directed.   If you develop nausea and vomiting that is not controlled by your nausea medication, call the clinic.   BELOW ARE SYMPTOMS THAT SHOULD BE REPORTED IMMEDIATELY:  *FEVER GREATER THAN 100.5 F  *CHILLS WITH OR WITHOUT FEVER  NAUSEA AND VOMITING THAT IS NOT CONTROLLED WITH YOUR NAUSEA MEDICATION  *UNUSUAL SHORTNESS OF BREATH  *UNUSUAL BRUISING OR BLEEDING  TENDERNESS IN MOUTH AND THROAT WITH OR WITHOUT PRESENCE OF ULCERS  *URINARY PROBLEMS  *BOWEL PROBLEMS  UNUSUAL RASH Items with * indicate a potential emergency and should be followed up as soon as possible.  Feel free to call the clinic should you have any questions or concerns. The clinic phone number is [phone].  Please show the CHEMO ALERT CARD at check-in to the Emergency Department and triage nurse.    

## 2020-08-16 NOTE — Progress Notes (Signed)
Okay to treat with CR of 2.28 per Dr. [name].

## 2020-08-17 ENCOUNTER — Telehealth: Payer: Self-pay | Admitting: Internal Medicine

## 2020-08-17 NOTE — Telephone Encounter (Signed)
Scheduled appts per 3/23 los. Will have updated calendar printed for patient at next visit.

## 2020-08-28 ENCOUNTER — Ambulatory Visit: Payer: Medicare Other | Admitting: Cardiovascular Disease

## 2020-08-29 NOTE — Telephone Encounter (Signed)
No call made on this day.

## 2020-09-06 ENCOUNTER — Inpatient Hospital Stay (HOSPITAL_BASED_OUTPATIENT_CLINIC_OR_DEPARTMENT_OTHER): Payer: Medicare Other | Admitting: Internal Medicine

## 2020-09-06 ENCOUNTER — Other Ambulatory Visit: Payer: Self-pay

## 2020-09-06 ENCOUNTER — Inpatient Hospital Stay: Payer: Medicare Other

## 2020-09-06 ENCOUNTER — Encounter: Payer: Self-pay | Admitting: Internal Medicine

## 2020-09-06 ENCOUNTER — Inpatient Hospital Stay: Payer: Medicare Other | Attending: Internal Medicine

## 2020-09-06 VITALS — BP 143/63 | HR 87 | Temp 97.5°F | Resp 20 | Ht 75.0 in | Wt 229.5 lb

## 2020-09-06 DIAGNOSIS — I129 Hypertensive chronic kidney disease with stage 1 through stage 4 chronic kidney disease, or unspecified chronic kidney disease: Secondary | ICD-10-CM | POA: Insufficient documentation

## 2020-09-06 DIAGNOSIS — E785 Hyperlipidemia, unspecified: Secondary | ICD-10-CM | POA: Insufficient documentation

## 2020-09-06 DIAGNOSIS — C3491 Malignant neoplasm of unspecified part of right bronchus or lung: Secondary | ICD-10-CM | POA: Diagnosis not present

## 2020-09-06 DIAGNOSIS — M1990 Unspecified osteoarthritis, unspecified site: Secondary | ICD-10-CM | POA: Diagnosis not present

## 2020-09-06 DIAGNOSIS — I252 Old myocardial infarction: Secondary | ICD-10-CM | POA: Insufficient documentation

## 2020-09-06 DIAGNOSIS — R5383 Other fatigue: Secondary | ICD-10-CM | POA: Diagnosis not present

## 2020-09-06 DIAGNOSIS — E1122 Type 2 diabetes mellitus with diabetic chronic kidney disease: Secondary | ICD-10-CM | POA: Diagnosis not present

## 2020-09-06 DIAGNOSIS — N189 Chronic kidney disease, unspecified: Secondary | ICD-10-CM | POA: Diagnosis not present

## 2020-09-06 DIAGNOSIS — Z7984 Long term (current) use of oral hypoglycemic drugs: Secondary | ICD-10-CM | POA: Insufficient documentation

## 2020-09-06 DIAGNOSIS — C787 Secondary malignant neoplasm of liver and intrahepatic bile duct: Secondary | ICD-10-CM

## 2020-09-06 DIAGNOSIS — Z5112 Encounter for antineoplastic immunotherapy: Secondary | ICD-10-CM | POA: Diagnosis not present

## 2020-09-06 DIAGNOSIS — Z79899 Other long term (current) drug therapy: Secondary | ICD-10-CM | POA: Insufficient documentation

## 2020-09-06 DIAGNOSIS — Z7982 Long term (current) use of aspirin: Secondary | ICD-10-CM | POA: Diagnosis not present

## 2020-09-06 DIAGNOSIS — I2510 Atherosclerotic heart disease of native coronary artery without angina pectoris: Secondary | ICD-10-CM

## 2020-09-06 DIAGNOSIS — C3490 Malignant neoplasm of unspecified part of unspecified bronchus or lung: Secondary | ICD-10-CM

## 2020-09-06 LAB — CBC WITH DIFFERENTIAL (CANCER CENTER ONLY)
Abs Immature Granulocytes: 0.01 10*3/uL (ref 0.00–0.07)
Basophils Absolute: 0 10*3/uL (ref 0.0–0.1)
Basophils Relative: 1 %
Eosinophils Absolute: 0.2 10*3/uL (ref 0.0–0.5)
Eosinophils Relative: 2 %
HCT: 33.7 % — ABNORMAL LOW (ref 39.0–52.0)
Hemoglobin: 11.2 g/dL — ABNORMAL LOW (ref 13.0–17.0)
Immature Granulocytes: 0 %
Lymphocytes Relative: 42 %
Lymphs Abs: 3 10*3/uL (ref 0.7–4.0)
MCH: 29.7 pg (ref 26.0–34.0)
MCHC: 33.2 g/dL (ref 30.0–36.0)
MCV: 89.4 fL (ref 80.0–100.0)
Monocytes Absolute: 0.7 10*3/uL (ref 0.1–1.0)
Monocytes Relative: 10 %
Neutro Abs: 3.2 10*3/uL (ref 1.7–7.7)
Neutrophils Relative %: 45 %
Platelet Count: 237 10*3/uL (ref 150–400)
RBC: 3.77 MIL/uL — ABNORMAL LOW (ref 4.22–5.81)
RDW: 13.9 % (ref 11.5–15.5)
WBC Count: 7.1 10*3/uL (ref 4.0–10.5)
nRBC: 0 % (ref 0.0–0.2)

## 2020-09-06 LAB — CMP (CANCER CENTER ONLY)
ALT: 12 U/L (ref 0–44)
AST: 17 U/L (ref 15–41)
Albumin: 3.8 g/dL (ref 3.5–5.0)
Alkaline Phosphatase: 63 U/L (ref 38–126)
Anion gap: 12 (ref 5–15)
BUN: 26 mg/dL — ABNORMAL HIGH (ref 8–23)
CO2: 23 mmol/L (ref 22–32)
Calcium: 9.2 mg/dL (ref 8.9–10.3)
Chloride: 105 mmol/L (ref 98–111)
Creatinine: 2.06 mg/dL — ABNORMAL HIGH (ref 0.61–1.24)
GFR, Estimated: 35 mL/min — ABNORMAL LOW (ref >60)
Glucose, Bld: 267 mg/dL — ABNORMAL HIGH (ref 70–99)
Potassium: 4 mmol/L (ref 3.5–5.1)
Sodium: 140 mmol/L (ref 135–145)
Total Bilirubin: 0.3 mg/dL (ref 0.3–1.2)
Total Protein: 8.2 g/dL — ABNORMAL HIGH (ref 6.5–8.1)

## 2020-09-06 LAB — TSH: TSH: 0.91 u[IU]/mL (ref 0.320–4.118)

## 2020-09-06 MED ORDER — SODIUM CHLORIDE 0.9 % IV SOLN
Freq: Once | INTRAVENOUS | Status: DC
  Filled 2020-09-06: qty 250

## 2020-09-06 MED ORDER — SODIUM CHLORIDE 0.9% FLUSH
10.0000 mL | INTRAVENOUS | Status: DC | PRN
Start: 2020-09-06 — End: 2020-09-06
  Administered 2020-09-06: 10 mL
  Filled 2020-09-06: qty 10

## 2020-09-06 MED ORDER — SODIUM CHLORIDE 0.9 % IV SOLN
200.0000 mg | Freq: Once | INTRAVENOUS | Status: AC
  Administered 2020-09-06: 200 mg via INTRAVENOUS
  Filled 2020-09-06: qty 8

## 2020-09-06 MED ORDER — HEPARIN SOD (PORK) LOCK FLUSH 100 UNIT/ML IV SOLN
500.0000 [IU] | Freq: Once | INTRAVENOUS | Status: AC | PRN
  Administered 2020-09-06: 500 [IU]
  Filled 2020-09-06: qty 5

## 2020-09-06 MED ORDER — SODIUM CHLORIDE 0.9 % IV SOLN
Freq: Once | INTRAVENOUS | Status: AC
  Filled 2020-09-06: qty 250

## 2020-09-06 NOTE — Patient Instructions (Signed)
[facility] Discharge Instructions for Patients Receiving Chemotherapy  Today you received the following chemotherapy agents:  Keytruda.  To help prevent nausea and vomiting after your treatment, we encourage you to take your nausea medication as directed.   If you develop nausea and vomiting that is not controlled by your nausea medication, call the clinic.   BELOW ARE SYMPTOMS THAT SHOULD BE REPORTED IMMEDIATELY:  *FEVER GREATER THAN 100.5 F  *CHILLS WITH OR WITHOUT FEVER  NAUSEA AND VOMITING THAT IS NOT CONTROLLED WITH YOUR NAUSEA MEDICATION  *UNUSUAL SHORTNESS OF BREATH  *UNUSUAL BRUISING OR BLEEDING  TENDERNESS IN MOUTH AND THROAT WITH OR WITHOUT PRESENCE OF ULCERS  *URINARY PROBLEMS  *BOWEL PROBLEMS  UNUSUAL RASH Items with * indicate a potential emergency and should be followed up as soon as possible.  Feel free to call the clinic should you have any questions or concerns. The clinic phone number is [phone].  Please show the CHEMO ALERT CARD at check-in to the Emergency Department and triage nurse.    

## 2020-09-06 NOTE — Progress Notes (Signed)
[place] [phone]   [phone]  OFFICE PROGRESS NOTE  No primary care provider on file. No primary provider on file.  DIAGNOSIS: Stage IV (TX, N3, M1c) non-small cell lung cancer, poorly differentiated adenocarcinoma presented with bulky mediastinal lymphadenopathy in addition to right axillary lymphadenopathy and metastatic liver lesions, abdominal lymphadenopathy diagnosed in January 2021. Molecular studies by [employer] that shows no actionable mutations.  PRIOR THERAPY: None  CURRENT THERAPY: Systemic chemotherapy with carboplatin for AUC of 5, Alimta 500 mg/M2 and Keytruda 200 mg IV every 3 weeks.  First dose July 14, 2019.  Status post 20 cycles. Starting from cycle #5 the patient will be treated with maintenance Alimta and Keytruda every 3 weeks.  Starting from cycle #12 he will be on single agent treatment with Keytruda secondary to renal insufficiency.  INTERVAL HISTORY: Kirk [name] 65 y.o. male returns to the clinic today for follow-up visit.  The patient is feeling fine today with no concerning complaints except for mild fatigue.  He denied having any current chest pain, shortness of breath, cough or hemoptysis.  He denied having any nausea, vomiting, diarrhea or constipation.  He has no headache or visual changes.  He continues to tolerate his maintenance treatment with Keytruda fairly well.  The patient is here today for evaluation before starting cycle #21.  MEDICAL HISTORY: Past Medical History:  Diagnosis Date  . Arthritis    "right knee" (09/04/2015)  . Atrial flutter with rapid ventricular response ([place]) 04/19/2019  . Dizziness 10/2019  . Dyspnea    increased exertion  . Elevated serum creatinine 07/2020  . HTN (hypertension)   . Hyperlipidemia   . Lung cancer ([place]) 05/2019  . Microalbuminuria 07/2020  . NSTEMI (non-ST elevated myocardial infarction) ([place]) 09/04/2015   a. cath 09/05/2015: 95% stenosis mid-LAD,  55% mid-RCA, and 40% prox-RCA. PCI performed w/ Synergy DES to LAD  . PAD (peripheral artery disease) ([facility])    Stenting of bilateral iliacs in 2003.  [name] Refusal of blood transfusions as patient is Jehovah's Witness   . Type II diabetes mellitus ([place])   . Vitamin D deficiency 07/2020    ALLERGIES:  has No Known Allergies.  MEDICATIONS:  Current Outpatient Medications  Medication Sig Dispense Refill  . acetaminophen (TYLENOL) 500 MG tablet Take 500 mg by mouth every 6 (six) hours as needed for moderate pain or fever.     [name] aspirin EC 81 MG tablet Take 81 mg by mouth at bedtime.    . carvedilol (COREG) 12.5 MG tablet Take 1 tablet (12.5 mg total) by mouth 2 (two) times daily with a meal. 180 tablet 3  . cetirizine (ZYRTEC) 10 MG tablet Take 1 tablet (10 mg total) by mouth daily. 90 tablet 3  . clopidogrel (PLAVIX) 75 MG tablet Take 1 tablet (75 mg total) by mouth daily. 90 tablet 3  . docusate sodium (COLACE) 100 MG capsule Take 1 capsule (100 mg total) by mouth 2 (two) times daily as needed for mild constipation. 60 capsule 11  . ferrous sulfate 324 MG TBEC Take 1 tablet (324 mg total) by mouth daily with breakfast. 30 tablet 11  . folic acid (FOLVITE) 1 MG tablet Take 1 tablet (1 mg total) by mouth daily. 90 tablet 3  . furosemide (LASIX) 20 MG tablet Take 1 tablet (20 mg total) by mouth 2 (two) times daily. 60 tablet 1  . gabapentin (NEURONTIN) 300 MG capsule Take 1 capsule (300 mg  total) by mouth 3 (three) times daily. 270 capsule 3  . glimepiride (AMARYL) 4 MG tablet Take 1 tablet (4 mg total) by mouth daily. 90 tablet 3  . Hydrocortisone Acetate 1 % CREA Apply 15 g topically 2 (two) times daily. 15 g 3  . ipratropium (ATROVENT) 0.03 % nasal spray USE 2 SPRAYS IN EACH NOSTRIL TWICE DAILY (Patient taking differently: Place 1 spray into both nostrils daily as needed (allergies).) 30 mL 2  . lactulose (CHRONULAC) 10 GM/15ML solution Take 30 mLs (20 g total) by mouth 2 (two) times daily as  needed for moderate constipation. 240 mL 11  . lidocaine-prilocaine (EMLA) cream APPLY TO THE PORT-A-CATH SITE 30-60 MINUTES BEFORE TREATMENT. (Patient taking differently: Apply 1 application topically daily as needed (access port). Apply to the Port-A-Cath site 30-60 minutes before treatment.) 30 g 0  . meclizine (ANTIVERT) 25 MG tablet Take 1 tablet (25 mg total) by mouth 3 (three) times daily as needed for dizziness. 90 tablet 3  . metFORMIN (GLUCOPHAGE) 1000 MG tablet Take 1 tablet (1,000 mg total) by mouth 2 (two) times daily with a meal. 180 tablet 3  . multivitamin (ONE-A-DAY MEN'S) TABS tablet Take 1 tablet by mouth daily. 30 tablet 6  . nitroGLYCERIN (NITROSTAT) 0.4 MG SL tablet Place 1 tablet (0.4 mg total) under the tongue every 5 (five) minutes x 3 doses as needed for chest pain. 25 tablet 2  . oxyCODONE-acetaminophen (PERCOCET/ROXICET) 5-325 MG tablet Take 1 tablet by mouth every 6 (six) hours as needed for severe pain. 20 tablet 0  . pantoprazole (PROTONIX) 40 MG tablet Take 1 tablet (40 mg total) by mouth daily. 90 tablet 3  . prochlorperazine (COMPAZINE) 10 MG tablet Take 1 tablet (10 mg total) by mouth every 6 (six) hours as needed for nausea or vomiting. 30 tablet 11  . rosuvastatin (CRESTOR) 40 MG tablet Take 1 tablet (40 mg total) by mouth daily. 90 tablet 3  . Vitamin D, Ergocalciferol, (DRISDOL) 1.25 MG (50000 UNIT) CAPS capsule Take 1 capsule (50,000 Units total) by mouth every 7 (seven) days. 5 capsule 3   No current facility-administered medications for this visit.    SURGICAL HISTORY:  Past Surgical History:  Procedure Laterality Date  . CARDIAC CATHETERIZATION N/A 09/05/2015   Procedure: Left Heart Cath and Coronary Angiography;  Surgeon: Troy Sine, MD;  Location: [facility];  Service: Cardiovascular;  Laterality: N/A;  . CARDIAC CATHETERIZATION N/A 09/05/2015   Procedure: Coronary Stent Intervention;  Surgeon: Troy Sine, MD;  Location: [facility];  Service: Cardiovascular;  Laterality: N/A;  . IR IMAGING GUIDED PORT INSERTION  07/15/2019  . 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 PERIPHERAL VASCULAR CATHETERIZATION Bilateral 2003   "stenting"  . TONSILLECTOMY  ~ 1963    REVIEW OF SYSTEMS:  A comprehensive review of systems was negative except for: Constitutional: positive for fatigue   PHYSICAL EXAMINATION: General appearance: alert, cooperative, appears stated age, fatigued and no distress Head: Normocephalic, without obvious abnormality, atraumatic Neck: no adenopathy, no JVD, supple, symmetrical, trachea midline and thyroid not enlarged, symmetric, no tenderness/mass/nodules Lymph nodes: Cervical, supraclavicular, and axillary nodes normal. Resp: clear to auscultation bilaterally Back: symmetric, no curvature. ROM normal. No CVA tenderness. Cardio: regular rate and rhythm, S1, S2 normal, no murmur, click, rub or gallop GI: soft, non-tender; bowel sounds normal; no masses,  no organomegaly Extremities: extremities normal, atraumatic, no cyanosis or edema  ECOG PERFORMANCE STATUS: 1 - Symptomatic but completely ambulatory  Blood pressure (!) 143/63, pulse 87, temperature (!) 97.5 F (36.4 C), temperature source Tympanic, resp. rate 20, height 6\' 3"  (1.905 m), weight 229 lb 8 oz (104.1 kg), SpO2 97 %.  LABORATORY DATA: Lab Results  Component Value Date   WBC 7.1 09/06/2020   HGB 11.2 (L) 09/06/2020   HCT 33.7 (L) 09/06/2020   MCV 89.4 09/06/2020   PLT 237 09/06/2020      Chemistry      Component Value Date/Time   NA 140 09/06/2020 0840   NA 140 06/08/2015 0840   K 4.0 09/06/2020 0840   CL 105 09/06/2020 0840   CO2 23 09/06/2020 0840   BUN 26 (H) 09/06/2020 0840   BUN 15 06/08/2015 0840   CREATININE 2.06 (H) 09/06/2020 0840   CREATININE 1.06 10/01/2016 0855      Component Value Date/Time   CALCIUM 9.2 09/06/2020 0840   ALKPHOS 63 09/06/2020 0840   AST 17 09/06/2020 0840   ALT 12  09/06/2020 0840   BILITOT 0.3 09/06/2020 0840       RADIOGRAPHIC STUDIES: CT Abdomen Pelvis Wo Contrast  Result Date: 08/14/2020 CLINICAL DATA:  Stage IV non-small cell lung cancer with ongoing immunotherapy. Restaging. EXAM: CT CHEST, ABDOMEN AND PELVIS WITHOUT CONTRAST TECHNIQUE: Multidetector CT imaging of the chest, abdomen and pelvis was performed following the standard protocol without IV contrast. COMPARISON:  05/15/2020 CT chest, abdomen and pelvis. FINDINGS: CT CHEST FINDINGS Cardiovascular: Normal heart size. Stable trace pericardial effusion/thickening. Left main and 3 vessel coronary atherosclerosis. Right internal jugular Port-A-Cath terminates at the cavoatrial junction. Atherosclerotic nonaneurysmal thoracic aorta. Top-normal caliber main pulmonary artery (3.0 cm diameter), stable. Mediastinum/Nodes: No discrete thyroid nodules. Unremarkable esophagus. Stable mildly enlarged 1.0 cm short axis diameter right axillary lymph node (series 2/image 11). No additional pathologically enlarged axillary nodes. No pathologically enlarged mediastinal nodes. No discrete hilar adenopathy on these noncontrast images. Lungs/Pleura: No pneumothorax. No pleural effusion. Moderate centrilobular emphysema with diffuse bronchial wall thickening. No acute consolidative airspace disease, lung masses or significant pulmonary nodules. Musculoskeletal: No aggressive appearing focal osseous lesions. Moderate thoracic spondylosis. CT ABDOMEN PELVIS FINDINGS Hepatobiliary: Normal liver size. Hypodense anterior inferior right liver 1.6 x 1.3 cm lesion (series 2/image 69), previously 1.6 x 1.3 cm, stable, previously characterized as a hemangioma. No additional liver lesions. Normal gallbladder with no radiopaque cholelithiasis. No biliary ductal dilatation. Pancreas: Normal, with no mass or duct dilation. Spleen: Normal size. No mass. Adrenals/Urinary Tract: Normal adrenals. No renal stones. No hydronephrosis. No contour  deforming renal masses. Normal bladder. Stomach/Bowel: Normal non-distended stomach. Normal caliber small bowel with no small bowel wall thickening. Normal appendix. Oral contrast transits to the right colon. Moderate diffuse colonic stool volume. No large bowel wall thickening, significant diverticulosis or acute pericolonic fat stranding. Vascular/Lymphatic: Atherosclerotic nonaneurysmal abdominal aorta. Mildly enlarged 1.4 cm short axis diameter gastrohepatic ligament node (series 2/image 58), previously 1.4 cm, stable. No new pathologically enlarged lymph nodes in the abdomen or pelvis. Left external iliac 0.8 cm node (series 2/image 110) is stable and within normal limits. Reproductive: Normal size prostate. Other: No pneumoperitoneum, ascites or focal fluid collection. Stable mild haziness of the central mesenteric fat favoring panniculitis. Musculoskeletal: No aggressive appearing focal osseous lesions. Fixation pins in the left femoral neck appear intact. Mild lumbar spondylosis. IMPRESSION: 1. Stable exam. Stable mild right axillary and gastrohepatic ligament adenopathy. No new or progressive metastatic disease in the chest, abdomen or pelvis. 2.  Moderate diffuse colonic stool volume, suggesting constipation. 3. Left main and 3 vessel coronary atherosclerosis. 4. Aortic Atherosclerosis (ICD10-I70.0) and Emphysema (ICD10-J43.9). Electronically Signed   By: Ilona Sorrel M.D.   On: 08/14/2020 13:50   CT Chest Wo Contrast  Result Date: 08/14/2020 CLINICAL DATA:  Stage IV non-small cell lung cancer with ongoing immunotherapy. Restaging. EXAM: CT CHEST, ABDOMEN AND PELVIS WITHOUT CONTRAST TECHNIQUE: Multidetector CT imaging of the chest, abdomen and pelvis was performed following the standard protocol without IV contrast. COMPARISON:  05/15/2020 CT chest, abdomen and pelvis. FINDINGS: CT CHEST FINDINGS Cardiovascular: Normal heart size. Stable trace pericardial effusion/thickening. Left main and 3 vessel  coronary atherosclerosis. Right internal jugular Port-A-Cath terminates at the cavoatrial junction. Atherosclerotic nonaneurysmal thoracic aorta. Top-normal caliber main pulmonary artery (3.0 cm diameter), stable. Mediastinum/Nodes: No discrete thyroid nodules. Unremarkable esophagus. Stable mildly enlarged 1.0 cm short axis diameter right axillary lymph node (series 2/image 11). No additional pathologically enlarged axillary nodes. No pathologically enlarged mediastinal nodes. No discrete hilar adenopathy on these noncontrast images. Lungs/Pleura: No pneumothorax. No pleural effusion. Moderate centrilobular emphysema with diffuse bronchial wall thickening. No acute consolidative airspace disease, lung masses or significant pulmonary nodules. Musculoskeletal: No aggressive appearing focal osseous lesions. Moderate thoracic spondylosis. CT ABDOMEN PELVIS FINDINGS Hepatobiliary: Normal liver size. Hypodense anterior inferior right liver 1.6 x 1.3 cm lesion (series 2/image 69), previously 1.6 x 1.3 cm, stable, previously characterized as a hemangioma. No additional liver lesions. Normal gallbladder with no radiopaque cholelithiasis. No biliary ductal dilatation. Pancreas: Normal, with no mass or duct dilation. Spleen: Normal size. No mass. Adrenals/Urinary Tract: Normal adrenals. No renal stones. No hydronephrosis. No contour deforming renal masses. Normal bladder. Stomach/Bowel: Normal non-distended stomach. Normal caliber small bowel with no small bowel wall thickening. Normal appendix. Oral contrast transits to the right colon. Moderate diffuse colonic stool volume. No large bowel wall thickening, significant diverticulosis or acute pericolonic fat stranding. Vascular/Lymphatic: Atherosclerotic nonaneurysmal abdominal aorta. Mildly enlarged 1.4 cm short axis diameter gastrohepatic ligament node (series 2/image 58), previously 1.4 cm, stable. No new pathologically enlarged lymph nodes in the abdomen or pelvis. Left  external iliac 0.8 cm node (series 2/image 110) is stable and within normal limits. Reproductive: Normal size prostate. Other: No pneumoperitoneum, ascites or focal fluid collection. Stable mild haziness of the central mesenteric fat favoring panniculitis. Musculoskeletal: No aggressive appearing focal osseous lesions. Fixation pins in the left femoral neck appear intact. Mild lumbar spondylosis. IMPRESSION: 1. Stable exam. Stable mild right axillary and gastrohepatic ligament adenopathy. No new or progressive metastatic disease in the chest, abdomen or pelvis. 2. Moderate diffuse colonic stool volume, suggesting constipation. 3. Left main and 3 vessel coronary atherosclerosis. 4. Aortic Atherosclerosis (ICD10-I70.0) and Emphysema (ICD10-J43.9). Electronically Signed   By: Ilona Sorrel M.D.   On: 08/14/2020 13:50    ASSESSMENT AND PLAN: This is a very pleasant 65 years old African-American male recently diagnosed with a stage IV (TX, N3, M1c) non-small cell lung cancer, poorly differentiated adenocarcinoma presented with bulky mediastinal as well as right hilar, supraclavicular, abdominal and right axillary lymphadenopathy in addition to metastatic liver lesions diagnosed in January 2021. Molecular studies by guardant 360 shows no actionable mutations.  The patient is not a candidate for treatment with targeted therapy or enrollment in the clinical trial with the [facility] regimen. The patient is currently undergoing systemic chemotherapy with carboplatin for AUC of 5, Alimta 500 mg/M2 and Keytruda 200 mg IV every 3 weeks status post 20 cycles. Starting from cycle #5 he is  treated with maintenance Alimta and Keytruda every 3 weeks.  Starting from cycle #12 the patient has been on treatment with single agent Keytruda because of the renal insufficiency. The patient continues to tolerate this treatment well with no concerning adverse effects except for mild fatigue. I recommended for him to proceed with cycle #20  one of his maintenance treatment with [facility] today as planned. He will come back for follow-up visit in 3 weeks for evaluation before the next cycle of his treatment. For the anemia of chronic disease, we will continue to monitor his hemoglobin and hematocrit closely.  The patient was also advised to continue with the oral iron tablets. For the chronic renal insufficiency he is followed by nephrology. He was advised to call immediately if he has any other concerning symptoms in the interval. The patient voices understanding of current disease status and treatment options and is in agreement with the current care plan.  All questions were answered. The patient knows to call the clinic with any problems, questions or concerns. We can certainly see the patient much sooner if necessary.  Disclaimer: This note was dictated with voice recognition software. Similar sounding words can inadvertently be transcribed and may not be corrected upon review.

## 2020-09-06 NOTE — Progress Notes (Signed)
Per Dr. [name], ok to treat with scr of 2.08.

## 2020-09-27 ENCOUNTER — Other Ambulatory Visit: Payer: Medicare Other

## 2020-09-27 ENCOUNTER — Inpatient Hospital Stay: Payer: Medicare Other

## 2020-09-27 ENCOUNTER — Other Ambulatory Visit: Payer: Self-pay

## 2020-09-27 ENCOUNTER — Inpatient Hospital Stay: Payer: Medicare Other | Attending: Internal Medicine | Admitting: Internal Medicine

## 2020-09-27 VITALS — BP 154/67 | HR 87 | Temp 97.7°F | Resp 20 | Ht 75.0 in | Wt 228.8 lb

## 2020-09-27 DIAGNOSIS — E1122 Type 2 diabetes mellitus with diabetic chronic kidney disease: Secondary | ICD-10-CM | POA: Insufficient documentation

## 2020-09-27 DIAGNOSIS — Z79899 Other long term (current) drug therapy: Secondary | ICD-10-CM | POA: Insufficient documentation

## 2020-09-27 DIAGNOSIS — Z7984 Long term (current) use of oral hypoglycemic drugs: Secondary | ICD-10-CM | POA: Diagnosis not present

## 2020-09-27 DIAGNOSIS — C3491 Malignant neoplasm of unspecified part of right bronchus or lung: Secondary | ICD-10-CM | POA: Diagnosis not present

## 2020-09-27 DIAGNOSIS — C787 Secondary malignant neoplasm of liver and intrahepatic bile duct: Secondary | ICD-10-CM | POA: Diagnosis not present

## 2020-09-27 DIAGNOSIS — M1990 Unspecified osteoarthritis, unspecified site: Secondary | ICD-10-CM | POA: Diagnosis not present

## 2020-09-27 DIAGNOSIS — C3490 Malignant neoplasm of unspecified part of unspecified bronchus or lung: Secondary | ICD-10-CM

## 2020-09-27 DIAGNOSIS — I2510 Atherosclerotic heart disease of native coronary artery without angina pectoris: Secondary | ICD-10-CM | POA: Diagnosis not present

## 2020-09-27 DIAGNOSIS — I129 Hypertensive chronic kidney disease with stage 1 through stage 4 chronic kidney disease, or unspecified chronic kidney disease: Secondary | ICD-10-CM | POA: Diagnosis not present

## 2020-09-27 DIAGNOSIS — Z7982 Long term (current) use of aspirin: Secondary | ICD-10-CM | POA: Diagnosis not present

## 2020-09-27 DIAGNOSIS — Z5112 Encounter for antineoplastic immunotherapy: Secondary | ICD-10-CM | POA: Diagnosis present

## 2020-09-27 DIAGNOSIS — N189 Chronic kidney disease, unspecified: Secondary | ICD-10-CM | POA: Diagnosis not present

## 2020-09-27 DIAGNOSIS — R5383 Other fatigue: Secondary | ICD-10-CM | POA: Insufficient documentation

## 2020-09-27 DIAGNOSIS — E785 Hyperlipidemia, unspecified: Secondary | ICD-10-CM | POA: Insufficient documentation

## 2020-09-27 DIAGNOSIS — I252 Old myocardial infarction: Secondary | ICD-10-CM | POA: Insufficient documentation

## 2020-09-27 LAB — CBC WITH DIFFERENTIAL (CANCER CENTER ONLY)
Abs Immature Granulocytes: 0.02 10*3/uL (ref 0.00–0.07)
Basophils Absolute: 0 10*3/uL (ref 0.0–0.1)
Basophils Relative: 1 %
Eosinophils Absolute: 0.2 10*3/uL (ref 0.0–0.5)
Eosinophils Relative: 3 %
HCT: 35 % — ABNORMAL LOW (ref 39.0–52.0)
Hemoglobin: 11.3 g/dL — ABNORMAL LOW (ref 13.0–17.0)
Immature Granulocytes: 0 %
Lymphocytes Relative: 49 %
Lymphs Abs: 3.2 10*3/uL (ref 0.7–4.0)
MCH: 28.9 pg (ref 26.0–34.0)
MCHC: 32.3 g/dL (ref 30.0–36.0)
MCV: 89.5 fL (ref 80.0–100.0)
Monocytes Absolute: 0.7 10*3/uL (ref 0.1–1.0)
Monocytes Relative: 10 %
Neutro Abs: 2.4 10*3/uL (ref 1.7–7.7)
Neutrophils Relative %: 37 %
Platelet Count: 225 10*3/uL (ref 150–400)
RBC: 3.91 MIL/uL — ABNORMAL LOW (ref 4.22–5.81)
RDW: 14 % (ref 11.5–15.5)
WBC Count: 6.5 10*3/uL (ref 4.0–10.5)
nRBC: 0 % (ref 0.0–0.2)

## 2020-09-27 LAB — CMP (CANCER CENTER ONLY)
ALT: 15 U/L (ref 0–44)
AST: 21 U/L (ref 15–41)
Albumin: 4 g/dL (ref 3.5–5.0)
Alkaline Phosphatase: 79 U/L (ref 38–126)
Anion gap: 10 (ref 5–15)
BUN: 25 mg/dL — ABNORMAL HIGH (ref 8–23)
CO2: 23 mmol/L (ref 22–32)
Calcium: 9.8 mg/dL (ref 8.9–10.3)
Chloride: 107 mmol/L (ref 98–111)
Creatinine: 2.07 mg/dL — ABNORMAL HIGH (ref 0.61–1.24)
GFR, Estimated: 35 mL/min — ABNORMAL LOW (ref >60)
Glucose, Bld: 134 mg/dL — ABNORMAL HIGH (ref 70–99)
Potassium: 3.9 mmol/L (ref 3.5–5.1)
Sodium: 140 mmol/L (ref 135–145)
Total Bilirubin: 0.3 mg/dL (ref 0.3–1.2)
Total Protein: 8.6 g/dL — ABNORMAL HIGH (ref 6.5–8.1)

## 2020-09-27 LAB — TSH: TSH: 1.108 u[IU]/mL (ref 0.350–4.500)

## 2020-09-27 MED ORDER — SODIUM CHLORIDE 0.9 % IV SOLN
Freq: Once | INTRAVENOUS | Status: AC
  Filled 2020-09-27: qty 250

## 2020-09-27 MED ORDER — HEPARIN SOD (PORK) LOCK FLUSH 100 UNIT/ML IV SOLN
500.0000 [IU] | Freq: Once | INTRAVENOUS | Status: AC | PRN
  Administered 2020-09-27: 500 [IU]
  Filled 2020-09-27: qty 5

## 2020-09-27 MED ORDER — SODIUM CHLORIDE 0.9% FLUSH
10.0000 mL | INTRAVENOUS | Status: DC | PRN
  Administered 2020-09-27: 10 mL
  Filled 2020-09-27: qty 10

## 2020-09-27 MED ORDER — SODIUM CHLORIDE 0.9 % IV SOLN
200.0000 mg | Freq: Once | INTRAVENOUS | Status: AC
  Administered 2020-09-27: 200 mg via INTRAVENOUS
  Filled 2020-09-27: qty 8

## 2020-09-27 NOTE — Progress Notes (Signed)
[place] [phone]   [phone]  OFFICE PROGRESS NOTE  No primary care provider on file. No primary provider on file.  DIAGNOSIS: Stage IV (TX, N3, M1c) non-small cell lung cancer, poorly differentiated adenocarcinoma presented with bulky mediastinal lymphadenopathy in addition to right axillary lymphadenopathy and metastatic liver lesions, abdominal lymphadenopathy diagnosed in January 2021. Molecular studies by [employer] that shows no actionable mutations.  PRIOR THERAPY: None  CURRENT THERAPY: Systemic chemotherapy with carboplatin for AUC of 5, Alimta 500 mg/M2 and Keytruda 200 mg IV every 3 weeks.  First dose July 14, 2019.  Status post 21 cycles. Starting from cycle #5 the patient will be treated with maintenance Alimta and Keytruda every 3 weeks.  Starting from cycle #12 he will be on single agent treatment with Keytruda secondary to renal insufficiency.  INTERVAL HISTORY: Kirk [name] 65 y.o. male returns to the clinic today for follow-up visit.  The patient has no complaints today except for mild fatigue.  He denied having any chest pain, shortness of breath, cough or hemoptysis.  He denied having any fever or chills.  He has no nausea, vomiting, diarrhea or constipation.  He has no headache or visual changes.  He has no fever or chills.  He is here today for evaluation before starting cycle #22 of his treatment.  MEDICAL HISTORY: Past Medical History:  Diagnosis Date  . Arthritis    "right knee" (09/04/2015)  . Atrial flutter with rapid ventricular response ([place]) 04/19/2019  . Dizziness 10/2019  . Dyspnea    increased exertion  . Elevated serum creatinine 07/2020  . HTN (hypertension)   . Hyperlipidemia   . Lung cancer ([place]) 05/2019  . Microalbuminuria 07/2020  . NSTEMI (non-ST elevated myocardial infarction) ([place]) 09/04/2015   a. cath 09/05/2015: 95% stenosis mid-LAD, 55% mid-RCA, and 40% prox-RCA. PCI performed w/  Synergy DES to LAD  . PAD (peripheral artery disease) ([facility])    Stenting of bilateral iliacs in 2003.  [name] Refusal of blood transfusions as patient is Jehovah's Witness   . Type II diabetes mellitus ([place])   . Vitamin D deficiency 07/2020    ALLERGIES:  has No Known Allergies.  MEDICATIONS:  Current Outpatient Medications  Medication Sig Dispense Refill  . acetaminophen (TYLENOL) 500 MG tablet Take 500 mg by mouth every 6 (six) hours as needed for moderate pain or fever.     [name] aspirin EC 81 MG tablet Take 81 mg by mouth at bedtime.    . carvedilol (COREG) 12.5 MG tablet Take 1 tablet (12.5 mg total) by mouth 2 (two) times daily with a meal. 180 tablet 3  . cetirizine (ZYRTEC) 10 MG tablet Take 1 tablet (10 mg total) by mouth daily. 90 tablet 3  . clopidogrel (PLAVIX) 75 MG tablet Take 1 tablet (75 mg total) by mouth daily. 90 tablet 3  . docusate sodium (COLACE) 100 MG capsule Take 1 capsule (100 mg total) by mouth 2 (two) times daily as needed for mild constipation. 60 capsule 11  . ferrous sulfate 324 MG TBEC Take 1 tablet (324 mg total) by mouth daily with breakfast. 30 tablet 11  . folic acid (FOLVITE) 1 MG tablet Take 1 tablet (1 mg total) by mouth daily. 90 tablet 3  . furosemide (LASIX) 20 MG tablet Take 1 tablet (20 mg total) by mouth 2 (two) times daily. 60 tablet 1  . gabapentin (NEURONTIN) 300 MG capsule Take 1 capsule (300 mg total)  by mouth 3 (three) times daily. 270 capsule 3  . glimepiride (AMARYL) 4 MG tablet Take 1 tablet (4 mg total) by mouth daily. 90 tablet 3  . Hydrocortisone Acetate 1 % CREA Apply 15 g topically 2 (two) times daily. 15 g 3  . ipratropium (ATROVENT) 0.03 % nasal spray USE 2 SPRAYS IN EACH NOSTRIL TWICE DAILY (Patient taking differently: Place 1 spray into both nostrils daily as needed (allergies).) 30 mL 2  . lactulose (CHRONULAC) 10 GM/15ML solution Take 30 mLs (20 g total) by mouth 2 (two) times daily as needed for moderate constipation. 240 mL 11  .  lidocaine-prilocaine (EMLA) cream APPLY TO THE PORT-A-CATH SITE 30-60 MINUTES BEFORE TREATMENT. (Patient taking differently: Apply 1 application topically daily as needed (access port). Apply to the Port-A-Cath site 30-60 minutes before treatment.) 30 g 0  . meclizine (ANTIVERT) 25 MG tablet Take 1 tablet (25 mg total) by mouth 3 (three) times daily as needed for dizziness. 90 tablet 3  . metFORMIN (GLUCOPHAGE) 1000 MG tablet Take 1 tablet (1,000 mg total) by mouth 2 (two) times daily with a meal. 180 tablet 3  . multivitamin (ONE-A-DAY MEN'S) TABS tablet Take 1 tablet by mouth daily. 30 tablet 6  . nitroGLYCERIN (NITROSTAT) 0.4 MG SL tablet Place 1 tablet (0.4 mg total) under the tongue every 5 (five) minutes x 3 doses as needed for chest pain. 25 tablet 2  . oxyCODONE-acetaminophen (PERCOCET/ROXICET) 5-325 MG tablet Take 1 tablet by mouth every 6 (six) hours as needed for severe pain. 20 tablet 0  . pantoprazole (PROTONIX) 40 MG tablet Take 1 tablet (40 mg total) by mouth daily. 90 tablet 3  . prochlorperazine (COMPAZINE) 10 MG tablet Take 1 tablet (10 mg total) by mouth every 6 (six) hours as needed for nausea or vomiting. 30 tablet 11  . rosuvastatin (CRESTOR) 40 MG tablet Take 1 tablet (40 mg total) by mouth daily. 90 tablet 3  . Vitamin D, Ergocalciferol, (DRISDOL) 1.25 MG (50000 UNIT) CAPS capsule Take 1 capsule (50,000 Units total) by mouth every 7 (seven) days. 5 capsule 3   No current facility-administered medications for this visit.    SURGICAL HISTORY:  Past Surgical History:  Procedure Laterality Date  . CARDIAC CATHETERIZATION N/A 09/05/2015   Procedure: Left Heart Cath and Coronary Angiography;  Surgeon: Troy Sine, MD;  Location: [facility];  Service: Cardiovascular;  Laterality: N/A;  . CARDIAC CATHETERIZATION N/A 09/05/2015   Procedure: Coronary Stent Intervention;  Surgeon: Troy Sine, MD;  Location: [facility];  Service: Cardiovascular;  Laterality: N/A;   . IR IMAGING GUIDED PORT INSERTION  07/15/2019  . 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 PERIPHERAL VASCULAR CATHETERIZATION Bilateral 2003   "stenting"  . TONSILLECTOMY  ~ 1963    REVIEW OF SYSTEMS:  A comprehensive review of systems was negative except for: Constitutional: positive for fatigue   PHYSICAL EXAMINATION: General appearance: alert, cooperative, appears stated age, fatigued and no distress Head: Normocephalic, without obvious abnormality, atraumatic Neck: no adenopathy, no JVD, supple, symmetrical, trachea midline and thyroid not enlarged, symmetric, no tenderness/mass/nodules Lymph nodes: Cervical, supraclavicular, and axillary nodes normal. Resp: clear to auscultation bilaterally Back: symmetric, no curvature. ROM normal. No CVA tenderness. Cardio: regular rate and rhythm, S1, S2 normal, no murmur, click, rub or gallop GI: soft, non-tender; bowel sounds normal; no masses,  no organomegaly Extremities: extremities normal, atraumatic, no cyanosis or edema  ECOG  PERFORMANCE STATUS: 1 - Symptomatic but completely ambulatory  Blood pressure (!) 154/67, pulse 87, temperature 97.7 F (36.5 C), temperature source Tympanic, resp. rate 20, height 6\' 3"  (1.905 m), weight 228 lb 12.8 oz (103.8 kg), SpO2 98 %.  LABORATORY DATA: Lab Results  Component Value Date   WBC 7.1 09/06/2020   HGB 11.2 (L) 09/06/2020   HCT 33.7 (L) 09/06/2020   MCV 89.4 09/06/2020   PLT 237 09/06/2020      Chemistry      Component Value Date/Time   NA 140 09/06/2020 0840   NA 140 06/08/2015 0840   K 4.0 09/06/2020 0840   CL 105 09/06/2020 0840   CO2 23 09/06/2020 0840   BUN 26 (H) 09/06/2020 0840   BUN 15 06/08/2015 0840   CREATININE 2.06 (H) 09/06/2020 0840   CREATININE 1.06 10/01/2016 0855      Component Value Date/Time   CALCIUM 9.2 09/06/2020 0840   ALKPHOS 63 09/06/2020 0840   AST 17 09/06/2020 0840   ALT 12 09/06/2020 0840   BILITOT 0.3 09/06/2020 0840        RADIOGRAPHIC STUDIES: No results found.  ASSESSMENT AND PLAN: This is a very pleasant 65 years old African-American male recently diagnosed with a stage IV (TX, N3, M1c) non-small cell lung cancer, poorly differentiated adenocarcinoma presented with bulky mediastinal as well as right hilar, supraclavicular, abdominal and right axillary lymphadenopathy in addition to metastatic liver lesions diagnosed in January 2021. Molecular studies by [employer]ant 360 shows no actionable mutations.  The patient is not a candidate for treatment with targeted therapy or enrollment in the clinical trial with the [facility] regimen. The patient is currently undergoing systemic chemotherapy with carboplatin for AUC of 5, Alimta 500 mg/M2 and Keytruda 200 mg IV every 3 weeks status post 21 cycles. Starting from cycle #5 he is treated with maintenance Alimta and Keytruda every 3 weeks.  Starting from cycle #12 the patient has been on treatment with single agent Keytruda because of the renal insufficiency. He has been tolerating his treatment with maintenance Keytruda fairly well. I recommended for him to proceed with cycle #22 today as planned. I will see him back for follow-up visit in 3 weeks for evaluation before starting cycle #23. For the anemia of chronic disease, we will continue to monitor his hemoglobin and hematocrit closely.  The patient was also advised to continue with the oral iron tablets. For the chronic renal insufficiency he is followed by nephrology. The patient was advised to call immediately if he has any concerning symptoms in the interval. The patient voices understanding of current disease status and treatment options and is in agreement with the current care plan.  All questions were answered. The patient knows to call the clinic with any problems, questions or concerns. We can certainly see the patient much sooner if necessary.  Disclaimer: This note was dictated with voice recognition  software. Similar sounding words can inadvertently be transcribed and may not be corrected upon review.

## 2020-09-27 NOTE — Patient Instructions (Signed)
[facility] Discharge Instructions for Patients Receiving Chemotherapy  Today you received the following chemotherapy agents:  Keytruda.  To help prevent nausea and vomiting after your treatment, we encourage you to take your nausea medication as directed.   If you develop nausea and vomiting that is not controlled by your nausea medication, call the clinic.   BELOW ARE SYMPTOMS THAT SHOULD BE REPORTED IMMEDIATELY:  *FEVER GREATER THAN 100.5 F  *CHILLS WITH OR WITHOUT FEVER  NAUSEA AND VOMITING THAT IS NOT CONTROLLED WITH YOUR NAUSEA MEDICATION  *UNUSUAL SHORTNESS OF BREATH  *UNUSUAL BRUISING OR BLEEDING  TENDERNESS IN MOUTH AND THROAT WITH OR WITHOUT PRESENCE OF ULCERS  *URINARY PROBLEMS  *BOWEL PROBLEMS  UNUSUAL RASH Items with * indicate a potential emergency and should be followed up as soon as possible.  Feel free to call the clinic should you have any questions or concerns. The clinic phone number is [phone].  Please show the CHEMO ALERT CARD at check-in to the Emergency Department and triage nurse.    

## 2020-09-28 ENCOUNTER — Telehealth: Payer: Self-pay | Admitting: Internal Medicine

## 2020-09-28 NOTE — Telephone Encounter (Signed)
Scheduled per los. Called and left msg. Mailed printout  °

## 2020-10-18 ENCOUNTER — Inpatient Hospital Stay: Payer: Medicare Other

## 2020-10-18 ENCOUNTER — Other Ambulatory Visit: Payer: Self-pay

## 2020-10-18 ENCOUNTER — Encounter: Payer: Self-pay | Admitting: Internal Medicine

## 2020-10-18 ENCOUNTER — Other Ambulatory Visit: Payer: Medicare Other

## 2020-10-18 ENCOUNTER — Inpatient Hospital Stay (HOSPITAL_BASED_OUTPATIENT_CLINIC_OR_DEPARTMENT_OTHER): Payer: Medicare Other | Admitting: Internal Medicine

## 2020-10-18 VITALS — BP 154/55 | HR 93 | Temp 97.7°F | Resp 19 | Ht 75.0 in | Wt 231.5 lb

## 2020-10-18 DIAGNOSIS — Z95828 Presence of other vascular implants and grafts: Secondary | ICD-10-CM

## 2020-10-18 DIAGNOSIS — Z5112 Encounter for antineoplastic immunotherapy: Secondary | ICD-10-CM | POA: Diagnosis not present

## 2020-10-18 DIAGNOSIS — C3491 Malignant neoplasm of unspecified part of right bronchus or lung: Secondary | ICD-10-CM

## 2020-10-18 DIAGNOSIS — C787 Secondary malignant neoplasm of liver and intrahepatic bile duct: Secondary | ICD-10-CM

## 2020-10-18 DIAGNOSIS — C3490 Malignant neoplasm of unspecified part of unspecified bronchus or lung: Secondary | ICD-10-CM

## 2020-10-18 DIAGNOSIS — R5383 Other fatigue: Secondary | ICD-10-CM

## 2020-10-18 LAB — CBC WITH DIFFERENTIAL (CANCER CENTER ONLY)
Abs Immature Granulocytes: 0.01 10*3/uL (ref 0.00–0.07)
Basophils Absolute: 0 10*3/uL (ref 0.0–0.1)
Basophils Relative: 1 %
Eosinophils Absolute: 0.2 10*3/uL (ref 0.0–0.5)
Eosinophils Relative: 3 %
HCT: 32.3 % — ABNORMAL LOW (ref 39.0–52.0)
Hemoglobin: 10.7 g/dL — ABNORMAL LOW (ref 13.0–17.0)
Immature Granulocytes: 0 %
Lymphocytes Relative: 44 %
Lymphs Abs: 3.1 10*3/uL (ref 0.7–4.0)
MCH: 29.6 pg (ref 26.0–34.0)
MCHC: 33.1 g/dL (ref 30.0–36.0)
MCV: 89.2 fL (ref 80.0–100.0)
Monocytes Absolute: 0.8 10*3/uL (ref 0.1–1.0)
Monocytes Relative: 12 %
Neutro Abs: 2.8 10*3/uL (ref 1.7–7.7)
Neutrophils Relative %: 40 %
Platelet Count: 244 10*3/uL (ref 150–400)
RBC: 3.62 MIL/uL — ABNORMAL LOW (ref 4.22–5.81)
RDW: 14.4 % (ref 11.5–15.5)
WBC Count: 7 10*3/uL (ref 4.0–10.5)
nRBC: 0 % (ref 0.0–0.2)

## 2020-10-18 LAB — CMP (CANCER CENTER ONLY)
ALT: 14 U/L (ref 0–44)
AST: 17 U/L (ref 15–41)
Albumin: 3.8 g/dL (ref 3.5–5.0)
Alkaline Phosphatase: 72 U/L (ref 38–126)
Anion gap: 12 (ref 5–15)
BUN: 22 mg/dL (ref 8–23)
CO2: 23 mmol/L (ref 22–32)
Calcium: 9.4 mg/dL (ref 8.9–10.3)
Chloride: 106 mmol/L (ref 98–111)
Creatinine: 2.04 mg/dL — ABNORMAL HIGH (ref 0.61–1.24)
GFR, Estimated: 35 mL/min — ABNORMAL LOW (ref >60)
Glucose, Bld: 177 mg/dL — ABNORMAL HIGH (ref 70–99)
Potassium: 4.1 mmol/L (ref 3.5–5.1)
Sodium: 141 mmol/L (ref 135–145)
Total Bilirubin: 0.3 mg/dL (ref 0.3–1.2)
Total Protein: 8 g/dL (ref 6.5–8.1)

## 2020-10-18 LAB — TSH: TSH: 1.147 u[IU]/mL (ref 0.320–4.118)

## 2020-10-18 MED ORDER — HEPARIN SOD (PORK) LOCK FLUSH 100 UNIT/ML IV SOLN
500.0000 [IU] | Freq: Once | INTRAVENOUS | Status: AC | PRN
  Administered 2020-10-18: 500 [IU]
  Filled 2020-10-18: qty 5

## 2020-10-18 MED ORDER — SODIUM CHLORIDE 0.9% FLUSH
10.0000 mL | Freq: Once | INTRAVENOUS | Status: AC
  Administered 2020-10-18: 10 mL
  Filled 2020-10-18: qty 10

## 2020-10-18 MED ORDER — SODIUM CHLORIDE 0.9 % IV SOLN
Freq: Once | INTRAVENOUS | Status: AC
  Filled 2020-10-18: qty 250

## 2020-10-18 MED ORDER — SODIUM CHLORIDE 0.9 % IV SOLN
200.0000 mg | Freq: Once | INTRAVENOUS | Status: AC
  Administered 2020-10-18: 200 mg via INTRAVENOUS
  Filled 2020-10-18: qty 8

## 2020-10-18 MED ORDER — SODIUM CHLORIDE 0.9% FLUSH
10.0000 mL | INTRAVENOUS | Status: DC | PRN
  Administered 2020-10-18: 10 mL
  Filled 2020-10-18: qty 10

## 2020-10-18 NOTE — Progress Notes (Signed)
[place] Telephone:([phone]   [phone]  OFFICE PROGRESS NOTE  No primary care provider on file. No primary provider on file.  DIAGNOSIS: Stage IV (TX, N3, M1c) non-small cell lung cancer, poorly differentiated adenocarcinoma presented with bulky mediastinal lymphadenopathy in addition to right axillary lymphadenopathy and metastatic liver lesions, abdominal lymphadenopathy diagnosed in January 2021. Molecular studies by [employer] that shows no actionable mutations.  PRIOR THERAPY: None  CURRENT THERAPY: Systemic chemotherapy with carboplatin for AUC of 5, Alimta 500 mg/M2 and Keytruda 200 mg IV every 3 weeks.  First dose July 14, 2019.  Status post 22 cycles. Starting from cycle #5 the patient will be treated with maintenance Alimta and Keytruda every 3 weeks.  Starting from cycle #12 he will be on single agent treatment with Keytruda secondary to renal insufficiency.  INTERVAL HISTORY: Kirk [name] 65 y.o. male returns to the clinic today for follow-up visit.  The patient is feeling fine today with no concerning complaints.  He is still very active and now lifting weight.  He denied having any current chest pain, shortness of breath, cough or hemoptysis.  He denied having any fever or chills.  He has no nausea, vomiting, diarrhea or constipation.  He denied having any headache or visual changes.  He is here today for evaluation before starting cycle #23 of his treatment.  MEDICAL HISTORY: Past Medical History:  Diagnosis Date  . Arthritis    "right knee" (09/04/2015)  . Atrial flutter with rapid ventricular response ([place]) 04/19/2019  . Dizziness 10/2019  . Dyspnea    increased exertion  . Elevated serum creatinine 07/2020  . HTN (hypertension)   . Hyperlipidemia   . Lung cancer ([place]) 05/2019  . Microalbuminuria 07/2020  . NSTEMI (non-ST elevated myocardial infarction) ([place]) 09/04/2015   a. cath 09/05/2015: 95% stenosis mid-LAD, 55%  mid-RCA, and 40% prox-RCA. PCI performed w/ Synergy DES to LAD  . PAD (peripheral artery disease) ([facility])    Stenting of bilateral iliacs in 2003.  [name] Refusal of blood transfusions as patient is Jehovah's Witness   . Type II diabetes mellitus ([place])   . Vitamin D deficiency 07/2020    ALLERGIES:  has No Known Allergies.  MEDICATIONS:  Current Outpatient Medications  Medication Sig Dispense Refill  . acetaminophen (TYLENOL) 500 MG tablet Take 500 mg by mouth every 6 (six) hours as needed for moderate pain or fever.     [name] aspirin EC 81 MG tablet Take 81 mg by mouth at bedtime.    . carvedilol (COREG) 12.5 MG tablet Take 1 tablet (12.5 mg total) by mouth 2 (two) times daily with a meal. 180 tablet 3  . cetirizine (ZYRTEC) 10 MG tablet Take 1 tablet (10 mg total) by mouth daily. 90 tablet 3  . clopidogrel (PLAVIX) 75 MG tablet Take 1 tablet (75 mg total) by mouth daily. 90 tablet 3  . docusate sodium (COLACE) 100 MG capsule Take 1 capsule (100 mg total) by mouth 2 (two) times daily as needed for mild constipation. 60 capsule 11  . ferrous sulfate 324 MG TBEC Take 1 tablet (324 mg total) by mouth daily with breakfast. 30 tablet 11  . folic acid (FOLVITE) 1 MG tablet Take 1 tablet (1 mg total) by mouth daily. 90 tablet 3  . furosemide (LASIX) 20 MG tablet Take 1 tablet (20 mg total) by mouth 2 (two) times daily. 60 tablet 1  . gabapentin (NEURONTIN) 300 MG capsule Take  1 capsule (300 mg total) by mouth 3 (three) times daily. 270 capsule 3  . glimepiride (AMARYL) 4 MG tablet Take 1 tablet (4 mg total) by mouth daily. 90 tablet 3  . Hydrocortisone Acetate 1 % CREA Apply 15 g topically 2 (two) times daily. 15 g 3  . ipratropium (ATROVENT) 0.03 % nasal spray USE 2 SPRAYS IN EACH NOSTRIL TWICE DAILY (Patient taking differently: Place 1 spray into both nostrils daily as needed (allergies).) 30 mL 2  . lactulose (CHRONULAC) 10 GM/15ML solution Take 30 mLs (20 g total) by mouth 2 (two) times daily as needed  for moderate constipation. 240 mL 11  . lidocaine-prilocaine (EMLA) cream APPLY TO THE PORT-A-CATH SITE 30-60 MINUTES BEFORE TREATMENT. (Patient taking differently: Apply 1 application topically daily as needed (access port). Apply to the Port-A-Cath site 30-60 minutes before treatment.) 30 g 0  . meclizine (ANTIVERT) 25 MG tablet Take 1 tablet (25 mg total) by mouth 3 (three) times daily as needed for dizziness. 90 tablet 3  . metFORMIN (GLUCOPHAGE) 1000 MG tablet Take 1 tablet (1,000 mg total) by mouth 2 (two) times daily with a meal. 180 tablet 3  . multivitamin (ONE-A-DAY MEN'S) TABS tablet Take 1 tablet by mouth daily. 30 tablet 6  . nitroGLYCERIN (NITROSTAT) 0.4 MG SL tablet Place 1 tablet (0.4 mg total) under the tongue every 5 (five) minutes x 3 doses as needed for chest pain. 25 tablet 2  . oxyCODONE-acetaminophen (PERCOCET/ROXICET) 5-325 MG tablet Take 1 tablet by mouth every 6 (six) hours as needed for severe pain. 20 tablet 0  . pantoprazole (PROTONIX) 40 MG tablet Take 1 tablet (40 mg total) by mouth daily. 90 tablet 3  . prochlorperazine (COMPAZINE) 10 MG tablet Take 1 tablet (10 mg total) by mouth every 6 (six) hours as needed for nausea or vomiting. 30 tablet 11  . rosuvastatin (CRESTOR) 40 MG tablet Take 1 tablet (40 mg total) by mouth daily. 90 tablet 3  . Vitamin D, Ergocalciferol, (DRISDOL) 1.25 MG (50000 UNIT) CAPS capsule Take 1 capsule (50,000 Units total) by mouth every 7 (seven) days. 5 capsule 3   No current facility-administered medications for this visit.    SURGICAL HISTORY:  Past Surgical History:  Procedure Laterality Date  . CARDIAC CATHETERIZATION N/A 09/05/2015   Procedure: Left Heart Cath and Coronary Angiography;  Surgeon: Troy Sine, MD;  Location: [place] [facility];  Service: Cardiovascular;  Laterality: N/A;  . CARDIAC CATHETERIZATION N/A 09/05/2015   Procedure: Coronary Stent Intervention;  Surgeon: Troy Sine, MD;  Location: [place] [facility];   Service: Cardiovascular;  Laterality: N/A;  . IR IMAGING GUIDED PORT INSERTION  07/15/2019  . 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 PERIPHERAL VASCULAR CATHETERIZATION Bilateral 2003   "stenting"  . TONSILLECTOMY  ~ 1963    REVIEW OF SYSTEMS:  A comprehensive review of systems was negative.   PHYSICAL EXAMINATION: General appearance: alert, cooperative, appears stated age and no distress Head: Normocephalic, without obvious abnormality, atraumatic Neck: no adenopathy, no JVD, supple, symmetrical, trachea midline and thyroid not enlarged, symmetric, no tenderness/mass/nodules Lymph nodes: Cervical, supraclavicular, and axillary nodes normal. Resp: clear to auscultation bilaterally Back: symmetric, no curvature. ROM normal. No CVA tenderness. Cardio: regular rate and rhythm, S1, S2 normal, no murmur, click, rub or gallop GI: soft, non-tender; bowel sounds normal; no masses,  no organomegaly Extremities: extremities normal, atraumatic, no cyanosis or edema  ECOG PERFORMANCE STATUS:  1 - Symptomatic but completely ambulatory  Blood pressure (!) 154/55, pulse 93, temperature 97.7 F (36.5 C), temperature source Tympanic, resp. rate 19, height 6\' 3"  (1.905 m), weight 231 lb 8 oz (105 kg), SpO2 98 %.  LABORATORY DATA: Lab Results  Component Value Date   WBC 7.0 10/18/2020   HGB 10.7 (L) 10/18/2020   HCT 32.3 (L) 10/18/2020   MCV 89.2 10/18/2020   PLT 244 10/18/2020      Chemistry      Component Value Date/Time   NA 140 09/27/2020 0903   NA 140 06/08/2015 0840   K 3.9 09/27/2020 0903   CL 107 09/27/2020 0903   CO2 23 09/27/2020 0903   BUN 25 (H) 09/27/2020 0903   BUN 15 06/08/2015 0840   CREATININE 2.07 (H) 09/27/2020 0903   CREATININE 1.06 10/01/2016 0855      Component Value Date/Time   CALCIUM 9.8 09/27/2020 0903   ALKPHOS 79 09/27/2020 0903   AST 21 09/27/2020 0903   ALT 15 09/27/2020 0903   BILITOT 0.3 09/27/2020 0903        RADIOGRAPHIC STUDIES: No results found.  ASSESSMENT AND PLAN: This is a very pleasant 65 years old African-American male recently diagnosed with a stage IV (TX, N3, M1c) non-small cell lung cancer, poorly differentiated adenocarcinoma presented with bulky mediastinal as well as right hilar, supraclavicular, abdominal and right axillary lymphadenopathy in addition to metastatic liver lesions diagnosed in January 2021. Molecular studies by guardant 360 shows no actionable mutations.  The patient is not a candidate for treatment with targeted therapy or enrollment in the clinical trial with the [facility] regimen. The patient is currently undergoing systemic chemotherapy with carboplatin for AUC of 5, Alimta 500 mg/M2 and Keytruda 200 mg IV every 3 weeks status post 22 cycles. Starting from cycle #5 he is treated with maintenance Alimta and Keytruda every 3 weeks.  Starting from cycle #12 the patient has been on treatment with single agent Keytruda because of the renal insufficiency. The patient continues to tolerate this treatment well with no concerning adverse effects. I recommended for him to proceed with cycle #23 today as planned. I will see him back for follow-up visit in 3 weeks for evaluation with repeat CT scan of the chest, abdomen pelvis for restaging of his disease. For the anemia of chronic disease, we will continue to monitor his hemoglobin and hematocrit closely.  The patient was also advised to continue with the oral iron tablets. For the chronic renal insufficiency he is followed by nephrology. The patient was advised to call immediately if he has any concerning symptoms in the interval. The patient voices understanding of current disease status and treatment options and is in agreement with the current care plan.  All questions were answered. The patient knows to call the clinic with any problems, questions or concerns. We can certainly see the patient much sooner if  necessary.  Disclaimer: This note was dictated with voice recognition software. Similar sounding words can inadvertently be transcribed and may not be corrected upon review.

## 2020-10-18 NOTE — Patient Instructions (Signed)
[facility]  Discharge Instructions: ?Thank you for choosing [facility] to provide your oncology and hematology care.  ? ?If you have a lab appointment with the Cancer Center, please go directly to the Cancer Center and check in at the registration area. ?  ?Wear comfortable clothing and clothing appropriate for easy access to any Portacath or PICC line.  ? ?We strive to give you quality time with your provider. You may need to reschedule your appointment if you arrive late (15 or more minutes).  Arriving late affects you and other patients whose appointments are after yours.  Also, if you miss three or more appointments without notifying the office, you may be dismissed from the clinic at the provider?s discretion.    ?  ?For prescription refill requests, have your pharmacy contact our office and allow 72 hours for refills to be completed.   ? ?Today you received the following chemotherapy and/or immunotherapy agents: Keytruda ?  ?To help prevent nausea and vomiting after your treatment, we encourage you to take your nausea medication as directed. ? ?BELOW ARE SYMPTOMS THAT SHOULD BE REPORTED IMMEDIATELY: ?*FEVER GREATER THAN 100.4 F (38 ?C) OR HIGHER ?*CHILLS OR SWEATING ?*NAUSEA AND VOMITING THAT IS NOT CONTROLLED WITH YOUR NAUSEA MEDICATION ?*UNUSUAL SHORTNESS OF BREATH ?*UNUSUAL BRUISING OR BLEEDING ?*URINARY PROBLEMS (pain or burning when urinating, or frequent urination) ?*BOWEL PROBLEMS (unusual diarrhea, constipation, pain near the anus) ?TENDERNESS IN MOUTH AND THROAT WITH OR WITHOUT PRESENCE OF ULCERS (sore throat, sores in mouth, or a toothache) ?UNUSUAL RASH, SWELLING OR PAIN  ?UNUSUAL VAGINAL DISCHARGE OR ITCHING  ? ?Items with * indicate a potential emergency and should be followed up as soon as possible or go to the Emergency Department if any problems should occur. ? ?Please show the CHEMOTHERAPY ALERT CARD or IMMUNOTHERAPY ALERT CARD at check-in to the  Emergency Department and triage nurse. ? ?Should you have questions after your visit or need to cancel or reschedule your appointment, please contact [facility]  Dept: [phone]  and follow the prompts.  Office hours are 8:00 a.m. to 4:30 p.m. Monday - Friday. Please note that voicemails left after 4:00 p.m. may not be returned until the following business day.  We are closed weekends and major holidays. You have access to a nurse at all times for urgent questions. Please call the main number to the clinic Dept: [phone] and follow the prompts. ? ? ?For any non-urgent questions, you may also contact your provider using MyChart. We now offer e-Visits for anyone 18 and older to request care online for non-urgent symptoms. For details visit [email or web address]. ?  ?Also download the MyChart app! Go to the app store, search "MyChart", open the app, select [facility], and log in with your MyChart username and password. ? ?Due to Covid, a mask is required upon entering the hospital/clinic. If you do not have a mask, one will be given to you upon arrival. For doctor visits, patients may have 1 support person aged 18 or older with them. For treatment visits, patients cannot have anyone with them due to current Covid guidelines and our immunocompromised population.  ? ?

## 2020-10-18 NOTE — Progress Notes (Signed)
Per Dr. [name] ,it is ok to treat pt today with [facility] and creatinine of 2.04.

## 2020-11-07 ENCOUNTER — Encounter (HOSPITAL_COMMUNITY): Payer: Self-pay

## 2020-11-07 ENCOUNTER — Other Ambulatory Visit: Payer: Self-pay

## 2020-11-07 ENCOUNTER — Ambulatory Visit (HOSPITAL_COMMUNITY)
Admission: RE | Admit: 2020-11-07 | Discharge: 2020-11-07 | Disposition: A | Discharge status: Home / Self Care | Payer: Medicare Other | Source: Ambulatory Visit | Attending: Internal Medicine | Admitting: Internal Medicine

## 2020-11-07 DIAGNOSIS — C3490 Malignant neoplasm of unspecified part of unspecified bronchus or lung: Secondary | ICD-10-CM | POA: Diagnosis present

## 2020-11-09 ENCOUNTER — Encounter: Payer: Self-pay | Admitting: Internal Medicine

## 2020-11-09 ENCOUNTER — Other Ambulatory Visit: Payer: Self-pay

## 2020-11-09 ENCOUNTER — Inpatient Hospital Stay: Payer: Medicare Other | Attending: Internal Medicine | Admitting: Internal Medicine

## 2020-11-09 ENCOUNTER — Inpatient Hospital Stay: Payer: Medicare Other

## 2020-11-09 VITALS — BP 146/56 | HR 81 | Temp 97.6°F | Resp 19 | Ht 75.0 in | Wt 231.4 lb

## 2020-11-09 DIAGNOSIS — C3491 Malignant neoplasm of unspecified part of right bronchus or lung: Secondary | ICD-10-CM

## 2020-11-09 DIAGNOSIS — C3490 Malignant neoplasm of unspecified part of unspecified bronchus or lung: Secondary | ICD-10-CM

## 2020-11-09 DIAGNOSIS — D631 Anemia in chronic kidney disease: Secondary | ICD-10-CM | POA: Insufficient documentation

## 2020-11-09 DIAGNOSIS — C787 Secondary malignant neoplasm of liver and intrahepatic bile duct: Secondary | ICD-10-CM | POA: Insufficient documentation

## 2020-11-09 DIAGNOSIS — Z5112 Encounter for antineoplastic immunotherapy: Secondary | ICD-10-CM | POA: Insufficient documentation

## 2020-11-09 DIAGNOSIS — N189 Chronic kidney disease, unspecified: Secondary | ICD-10-CM | POA: Diagnosis not present

## 2020-11-09 DIAGNOSIS — R5383 Other fatigue: Secondary | ICD-10-CM

## 2020-11-09 DIAGNOSIS — Z79899 Other long term (current) drug therapy: Secondary | ICD-10-CM | POA: Diagnosis not present

## 2020-11-09 DIAGNOSIS — Z95828 Presence of other vascular implants and grafts: Secondary | ICD-10-CM

## 2020-11-09 LAB — CBC WITH DIFFERENTIAL (CANCER CENTER ONLY)
Abs Immature Granulocytes: 0.02 10*3/uL (ref 0.00–0.07)
Basophils Absolute: 0 10*3/uL (ref 0.0–0.1)
Basophils Relative: 1 %
Eosinophils Absolute: 0.2 10*3/uL (ref 0.0–0.5)
Eosinophils Relative: 3 %
HCT: 31.6 % — ABNORMAL LOW (ref 39.0–52.0)
Hemoglobin: 10.2 g/dL — ABNORMAL LOW (ref 13.0–17.0)
Immature Granulocytes: 0 %
Lymphocytes Relative: 46 %
Lymphs Abs: 2.8 10*3/uL (ref 0.7–4.0)
MCH: 29.4 pg (ref 26.0–34.0)
MCHC: 32.3 g/dL (ref 30.0–36.0)
MCV: 91.1 fL (ref 80.0–100.0)
Monocytes Absolute: 0.6 10*3/uL (ref 0.1–1.0)
Monocytes Relative: 10 %
Neutro Abs: 2.4 10*3/uL (ref 1.7–7.7)
Neutrophils Relative %: 40 %
Platelet Count: 247 10*3/uL (ref 150–400)
RBC: 3.47 MIL/uL — ABNORMAL LOW (ref 4.22–5.81)
RDW: 14.3 % (ref 11.5–15.5)
WBC Count: 6 10*3/uL (ref 4.0–10.5)
nRBC: 0 % (ref 0.0–0.2)

## 2020-11-09 LAB — CMP (CANCER CENTER ONLY)
ALT: 17 U/L (ref 0–44)
AST: 19 U/L (ref 15–41)
Albumin: 4 g/dL (ref 3.5–5.0)
Alkaline Phosphatase: 57 U/L (ref 38–126)
Anion gap: 7 (ref 5–15)
BUN: 20 mg/dL (ref 8–23)
CO2: 24 mmol/L (ref 22–32)
Calcium: 9.1 mg/dL (ref 8.9–10.3)
Chloride: 107 mmol/L (ref 98–111)
Creatinine: 1.73 mg/dL — ABNORMAL HIGH (ref 0.61–1.24)
GFR, Estimated: 43 mL/min — ABNORMAL LOW (ref >60)
Glucose, Bld: 257 mg/dL — ABNORMAL HIGH (ref 70–99)
Potassium: 4.5 mmol/L (ref 3.5–5.1)
Sodium: 138 mmol/L (ref 135–145)
Total Bilirubin: 0.2 mg/dL — ABNORMAL LOW (ref 0.3–1.2)
Total Protein: 7.9 g/dL (ref 6.5–8.1)

## 2020-11-09 LAB — TSH: TSH: 0.888 u[IU]/mL (ref 0.320–4.118)

## 2020-11-09 MED ORDER — HEPARIN SOD (PORK) LOCK FLUSH 100 UNIT/ML IV SOLN
500.0000 [IU] | Freq: Once | INTRAVENOUS | Status: AC | PRN
  Administered 2020-11-09: 500 [IU]
  Filled 2020-11-09: qty 5

## 2020-11-09 MED ORDER — SODIUM CHLORIDE 0.9% FLUSH
10.0000 mL | INTRAVENOUS | Status: DC | PRN
  Administered 2020-11-09: 10 mL
  Filled 2020-11-09: qty 10

## 2020-11-09 MED ORDER — SODIUM CHLORIDE 0.9 % IV SOLN
Freq: Once | INTRAVENOUS | Status: AC
  Filled 2020-11-09: qty 250

## 2020-11-09 MED ORDER — SODIUM CHLORIDE 0.9% FLUSH
10.0000 mL | Freq: Once | INTRAVENOUS | Status: AC
  Administered 2020-11-09: 10 mL
  Filled 2020-11-09: qty 10

## 2020-11-09 MED ORDER — SODIUM CHLORIDE 0.9 % IV SOLN
200.0000 mg | Freq: Once | INTRAVENOUS | Status: AC
  Administered 2020-11-09: 200 mg via INTRAVENOUS
  Filled 2020-11-09: qty 8

## 2020-11-09 NOTE — Progress Notes (Signed)
[place] Telephone:([phone]) [phone]   Fax:([phone]) [phone]  OFFICE PROGRESS NOTE  Pcp, No No address on file  DIAGNOSIS: Stage IV (TX, N3, M1c) non-small cell lung cancer, poorly differentiated adenocarcinoma presented with bulky mediastinal lymphadenopathy in addition to right axillary lymphadenopathy and metastatic liver lesions, abdominal lymphadenopathy diagnosed in January 2021. Molecular studies by [employer] that shows no actionable mutations.  PRIOR THERAPY: None  CURRENT THERAPY: Systemic chemotherapy with carboplatin for AUC of 5, Alimta 500 mg/M2 and Keytruda 200 mg IV every 3 weeks.  First dose July 14, 2019.  Status post 23 cycles. Starting from cycle #5 the patient will be treated with maintenance Alimta and Keytruda every 3 weeks.  Starting from cycle #12 he will be on single agent treatment with Keytruda secondary to renal insufficiency.  INTERVAL HISTORY: Kirk [name] 65 y.o. male returns to the clinic today for follow-up visit.  The patient is feeling fine today with no concerning complaints.  He denied having any significant fatigue or weakness.  He denied having any chest pain, shortness of breath, cough or hemoptysis.  He has no nausea, vomiting, diarrhea or constipation.  He has no headache or visual changes.  He denied having any recent weight loss or night sweats.  He continues to tolerate his treatment with single agent Keytruda fairly well.  He had repeat CT scan of the chest, abdomen and pelvis performed recently and he is here for evaluation and discussion of his scan results.  MEDICAL HISTORY: Past Medical History:  Diagnosis Date   Arthritis    "right knee" (09/04/2015)   Atrial flutter with rapid ventricular response ([place]) 04/19/2019   Dizziness 10/2019   Dyspnea    increased exertion   Elevated serum creatinine 07/2020   HTN (hypertension)    Hyperlipidemia    Lung cancer ([place]) 05/2019   Microalbuminuria 07/2020   NSTEMI  (non-ST elevated myocardial infarction) ([place]) 09/04/2015   a. cath 09/05/2015: 95% stenosis mid-LAD, 55% mid-RCA, and 40% prox-RCA. PCI performed w/ Synergy DES to LAD   PAD (peripheral artery disease) ([facility])    Stenting of bilateral iliacs in 2003.   Refusal of blood transfusions as patient is Jehovah's Witness    Type II diabetes mellitus ([place])    Vitamin D deficiency 07/2020    ALLERGIES:  has No Known Allergies.  MEDICATIONS:  Current Outpatient Medications  Medication Sig Dispense Refill   acetaminophen (TYLENOL) 500 MG tablet Take 500 mg by mouth every 6 (six) hours as needed for moderate pain or fever.      aspirin EC 81 MG tablet Take 81 mg by mouth at bedtime.     carvedilol (COREG) 12.5 MG tablet Take 1 tablet (12.5 mg total) by mouth 2 (two) times daily with a meal. 180 tablet 3   cetirizine (ZYRTEC) 10 MG tablet Take 1 tablet (10 mg total) by mouth daily. 90 tablet 3   clopidogrel (PLAVIX) 75 MG tablet Take 1 tablet (75 mg total) by mouth daily. 90 tablet 3   docusate sodium (COLACE) 100 MG capsule Take 1 capsule (100 mg total) by mouth 2 (two) times daily as needed for mild constipation. 60 capsule 11   ferrous sulfate 324 MG TBEC Take 1 tablet (324 mg total) by mouth daily with breakfast. 30 tablet 11   folic acid (FOLVITE) 1 MG tablet Take 1 tablet (1 mg total) by mouth daily. 90 tablet 3   furosemide (LASIX) 20 MG tablet Take 1 tablet (  20 mg total) by mouth 2 (two) times daily. 60 tablet 1   gabapentin (NEURONTIN) 300 MG capsule Take 1 capsule (300 mg total) by mouth 3 (three) times daily. 270 capsule 3   glimepiride (AMARYL) 4 MG tablet Take 1 tablet (4 mg total) by mouth daily. 90 tablet 3   Hydrocortisone Acetate 1 % CREA Apply 15 g topically 2 (two) times daily. 15 g 3   ipratropium (ATROVENT) 0.03 % nasal spray USE 2 SPRAYS IN EACH NOSTRIL TWICE DAILY (Patient taking differently: Place 1 spray into both nostrils daily as needed (allergies).) 30 mL 2   lactulose  (CHRONULAC) 10 GM/15ML solution Take 30 mLs (20 g total) by mouth 2 (two) times daily as needed for moderate constipation. 240 mL 11   lidocaine-prilocaine (EMLA) cream APPLY TO THE PORT-A-CATH SITE 30-60 MINUTES BEFORE TREATMENT. (Patient taking differently: Apply 1 application topically daily as needed (access port). Apply to the Port-A-Cath site 30-60 minutes before treatment.) 30 g 0   meclizine (ANTIVERT) 25 MG tablet Take 1 tablet (25 mg total) by mouth 3 (three) times daily as needed for dizziness. 90 tablet 3   metFORMIN (GLUCOPHAGE) 1000 MG tablet Take 1 tablet (1,000 mg total) by mouth 2 (two) times daily with a meal. 180 tablet 3   multivitamin (ONE-A-DAY MEN'S) TABS tablet Take 1 tablet by mouth daily. 30 tablet 6   nitroGLYCERIN (NITROSTAT) 0.4 MG SL tablet Place 1 tablet (0.4 mg total) under the tongue every 5 (five) minutes x 3 doses as needed for chest pain. 25 tablet 2   oxyCODONE-acetaminophen (PERCOCET/ROXICET) 5-325 MG tablet Take 1 tablet by mouth every 6 (six) hours as needed for severe pain. 20 tablet 0   pantoprazole (PROTONIX) 40 MG tablet Take 1 tablet (40 mg total) by mouth daily. 90 tablet 3   prochlorperazine (COMPAZINE) 10 MG tablet Take 1 tablet (10 mg total) by mouth every 6 (six) hours as needed for nausea or vomiting. 30 tablet 11   rosuvastatin (CRESTOR) 40 MG tablet Take 1 tablet (40 mg total) by mouth daily. 90 tablet 3   Vitamin D, Ergocalciferol, (DRISDOL) 1.25 MG (50000 UNIT) CAPS capsule Take 1 capsule (50,000 Units total) by mouth every 7 (seven) days. 5 capsule 3   No current facility-administered medications for this visit.    SURGICAL HISTORY:  Past Surgical History:  Procedure Laterality Date   CARDIAC CATHETERIZATION N/A 09/05/2015   Procedure: Left Heart Cath and Coronary Angiography;  Surgeon: Troy Sine, MD;  Location: [facility];  Service: Cardiovascular;  Laterality: N/A;   CARDIAC CATHETERIZATION N/A 09/05/2015   Procedure: Coronary  Stent Intervention;  Surgeon: Troy Sine, MD;  Location: [facility];  Service: Cardiovascular;  Laterality: N/A;   IR IMAGING GUIDED PORT INSERTION  07/15/2019   ORIF CONGENITAL HIP DISLOCATION Left ~ 1965   "put pins in it"   PERIPHERAL VASCULAR CATHETERIZATION Bilateral 2003   "stenting"   TONSILLECTOMY  ~ [place]:  Constitutional: negative Eyes: negative Ears, nose, mouth, throat, and face: negative Respiratory: negative Cardiovascular: negative Gastrointestinal: negative Genitourinary:negative Integument/breast: negative Hematologic/lymphatic: negative Musculoskeletal:negative Neurological: negative Behavioral/Psych: negative Endocrine: negative Allergic/Immunologic: negative   PHYSICAL EXAMINATION: General appearance: alert, cooperative, appears stated age, and no distress Head: Normocephalic, without obvious abnormality, atraumatic Neck: no adenopathy, no JVD, supple, symmetrical, trachea midline, and thyroid not enlarged, symmetric, no tenderness/mass/nodules Lymph nodes: Cervical, supraclavicular, and axillary nodes normal. Resp: clear to auscultation bilaterally Back: symmetric,  no curvature. ROM normal. No CVA tenderness. Cardio: regular rate and rhythm, S1, S2 normal, no murmur, click, rub or gallop GI: soft, non-tender; bowel sounds normal; no masses,  no organomegaly Extremities: extremities normal, atraumatic, no cyanosis or edema Neurologic: Alert and oriented X 3, normal strength and tone. Normal symmetric reflexes. Normal coordination and gait  ECOG PERFORMANCE STATUS: 1 - Symptomatic but completely ambulatory  Blood pressure (!) 146/56, pulse 81, temperature 97.6 F (36.4 C), temperature source Tympanic, resp. rate 19, height 6\' 3"  (1.905 m), weight 231 lb 6.4 oz (105 kg), SpO2 99 %.  LABORATORY DATA: Lab Results  Component Value Date   WBC 6.0 11/09/2020   HGB 10.2 (L) 11/09/2020   HCT 31.6 (L) 11/09/2020   MCV 91.1  11/09/2020   PLT 247 11/09/2020      Chemistry      Component Value Date/Time   NA 141 10/18/2020 0845   NA 140 06/08/2015 0840   K 4.1 10/18/2020 0845   CL 106 10/18/2020 0845   CO2 23 10/18/2020 0845   BUN 22 10/18/2020 0845   BUN 15 06/08/2015 0840   CREATININE 2.04 (H) 10/18/2020 0845   CREATININE 1.06 10/01/2016 0855      Component Value Date/Time   CALCIUM 9.4 10/18/2020 0845   ALKPHOS 72 10/18/2020 0845   AST 17 10/18/2020 0845   ALT 14 10/18/2020 0845   BILITOT 0.3 10/18/2020 0845       RADIOGRAPHIC STUDIES: CT Abdomen Pelvis Wo Contrast  Result Date: 11/07/2020 CLINICAL DATA:  Non-small cell lung cancer staging, ongoing immunotherapy EXAM: CT CHEST, ABDOMEN AND PELVIS WITHOUT CONTRAST TECHNIQUE: Multidetector CT imaging of the chest, abdomen and pelvis was performed following the standard protocol without IV contrast. Oral enteric contrast was administered. COMPARISON:  CT chest abdomen pelvis, 08/14/2020 FINDINGS: CT CHEST FINDINGS Cardiovascular: Right chest port catheter. Aortic atherosclerosis. Normal heart size. Three-vessel coronary artery calcifications and stents. Trace pericardial effusion. Mediastinum/Nodes: No enlarged mediastinal, hilar, or axillary lymph nodes. Thyroid gland, trachea, and esophagus demonstrate no significant findings. Lungs/Pleura: Mild centrilobular emphysema. Diffuse bilateral bronchial wall thickening. Unchanged mild, irregular scarring of the lateral segment right middle lobe. No pleural effusion or pneumothorax. Musculoskeletal: No chest wall mass or suspicious bone lesions identified. CT ABDOMEN PELVIS FINDINGS Hepatobiliary: No solid liver abnormality is seen. Unchanged hypodense subcapsular lesion of the anterior inferior right lobe of the liver, previously characterized as a hemangioma by contrast enhanced examination. No gallstones, gallbladder wall thickening, or biliary dilatation. Pancreas: Unremarkable. No pancreatic ductal dilatation  or surrounding inflammatory changes. Spleen: Normal in size without significant abnormality. Adrenals/Urinary Tract: Adrenal glands are unremarkable. Kidneys are normal, without renal calculi, solid lesion, or hydronephrosis. Bladder is unremarkable. Stomach/Bowel: Stomach is within normal limits. Appendix appears normal. No evidence of bowel wall thickening, distention, or inflammatory changes. Vascular/Lymphatic: Aortic atherosclerosis. Bilateral common iliac artery stents. Unchanged enlarged gastrohepatic ligament lymph nodes measuring up to 2.1 x 1.6 cm (series 2, image 58). Reproductive: No mass or other abnormality. Other: No abdominal wall hernia or abnormality. No abdominopelvic ascites. Musculoskeletal: No acute or significant osseous findings. IMPRESSION: 1. Unchanged enlarged gastrohepatic ligament lymph nodes. No other noncontrast evidence of metastatic disease in the chest, abdomen, or pelvis. 2. Emphysema and diffuse bilateral bronchial wall thickening. 3. Coronary artery disease. Aortic Atherosclerosis (ICD10-I70.0) and Emphysema (ICD10-J43.9). Electronically Signed   By: Eddie Candle M.D.   On: 11/07/2020 13:32   CT Chest Wo Contrast  Result Date: 11/07/2020 CLINICAL DATA:  Non-small cell lung  cancer staging, ongoing immunotherapy EXAM: CT CHEST, ABDOMEN AND PELVIS WITHOUT CONTRAST TECHNIQUE: Multidetector CT imaging of the chest, abdomen and pelvis was performed following the standard protocol without IV contrast. Oral enteric contrast was administered. COMPARISON:  CT chest abdomen pelvis, 08/14/2020 FINDINGS: CT CHEST FINDINGS Cardiovascular: Right chest port catheter. Aortic atherosclerosis. Normal heart size. Three-vessel coronary artery calcifications and stents. Trace pericardial effusion. Mediastinum/Nodes: No enlarged mediastinal, hilar, or axillary lymph nodes. Thyroid gland, trachea, and esophagus demonstrate no significant findings. Lungs/Pleura: Mild centrilobular emphysema. Diffuse  bilateral bronchial wall thickening. Unchanged mild, irregular scarring of the lateral segment right middle lobe. No pleural effusion or pneumothorax. Musculoskeletal: No chest wall mass or suspicious bone lesions identified. CT ABDOMEN PELVIS FINDINGS Hepatobiliary: No solid liver abnormality is seen. Unchanged hypodense subcapsular lesion of the anterior inferior right lobe of the liver, previously characterized as a hemangioma by contrast enhanced examination. No gallstones, gallbladder wall thickening, or biliary dilatation. Pancreas: Unremarkable. No pancreatic ductal dilatation or surrounding inflammatory changes. Spleen: Normal in size without significant abnormality. Adrenals/Urinary Tract: Adrenal glands are unremarkable. Kidneys are normal, without renal calculi, solid lesion, or hydronephrosis. Bladder is unremarkable. Stomach/Bowel: Stomach is within normal limits. Appendix appears normal. No evidence of bowel wall thickening, distention, or inflammatory changes. Vascular/Lymphatic: Aortic atherosclerosis. Bilateral common iliac artery stents. Unchanged enlarged gastrohepatic ligament lymph nodes measuring up to 2.1 x 1.6 cm (series 2, image 58). Reproductive: No mass or other abnormality. Other: No abdominal wall hernia or abnormality. No abdominopelvic ascites. Musculoskeletal: No acute or significant osseous findings. IMPRESSION: 1. Unchanged enlarged gastrohepatic ligament lymph nodes. No other noncontrast evidence of metastatic disease in the chest, abdomen, or pelvis. 2. Emphysema and diffuse bilateral bronchial wall thickening. 3. Coronary artery disease. Aortic Atherosclerosis (ICD10-I70.0) and Emphysema (ICD10-J43.9). Electronically Signed   By: Eddie Candle M.D.   On: 11/07/2020 13:32    ASSESSMENT AND PLAN: This is a very pleasant 65 years old African-American male recently diagnosed with a stage IV (TX, N3, M1c) non-small cell lung cancer, poorly differentiated adenocarcinoma presented with  bulky mediastinal as well as right hilar, supraclavicular, abdominal and right axillary lymphadenopathy in addition to metastatic liver lesions diagnosed in January 2021. Molecular studies by guardant 360 shows no actionable mutations.  The patient is not a candidate for treatment with targeted therapy or enrollment in the clinical trial with the [facility] regimen. The patient is currently undergoing systemic chemotherapy with carboplatin for AUC of 5, Alimta 500 mg/M2 and Keytruda 200 mg IV every 3 weeks status post 23 cycles. Starting from cycle #5 he is treated with maintenance Alimta and Keytruda every 3 weeks.  Starting from cycle #12 the patient has been on treatment with single agent Keytruda because of the renal insufficiency. The patient has been tolerating this treatment well with no concerning adverse effects. He had repeat CT scan of the chest, abdomen pelvis performed recently.  I personally and independently reviewed the scans and discussed the results with the patient today. Has a scan showed no concerning findings for disease progression. I recommended for him to continue his current treatment with maintenance Keytruda and he will proceed with cycle #24 today. I will see the patient back for follow-up visit in 3 weeks for evaluation before the next cycle of his treatment. For the anemia of chronic disease, we will continue to monitor his hemoglobin and hematocrit closely.  The patient was also advised to continue with the oral iron tablets. For the chronic renal insufficiency he is followed by nephrology.  The patient was advised to call immediately if he has any other concerning symptoms in the interval. The patient voices understanding of current disease status and treatment options and is in agreement with the current care plan.  All questions were answered. The patient knows to call the clinic with any problems, questions or concerns. We can certainly see the patient much sooner if  necessary.  Disclaimer: This note was dictated with voice recognition software. Similar sounding words can inadvertently be transcribed and may not be corrected upon review.

## 2020-11-09 NOTE — Patient Instructions (Signed)
[facility]  Discharge Instructions: ?Thank you for choosing [facility] to provide your oncology and hematology care.  ? ?If you have a lab appointment with the Cancer Center, please go directly to the Cancer Center and check in at the registration area. ?  ?Wear comfortable clothing and clothing appropriate for easy access to any Portacath or PICC line.  ? ?We strive to give you quality time with your provider. You may need to reschedule your appointment if you arrive late (15 or more minutes).  Arriving late affects you and other patients whose appointments are after yours.  Also, if you miss three or more appointments without notifying the office, you may be dismissed from the clinic at the provider?s discretion.    ?  ?For prescription refill requests, have your pharmacy contact our office and allow 72 hours for refills to be completed.   ? ?Today you received the following chemotherapy and/or immunotherapy agents: Keytruda ?  ?To help prevent nausea and vomiting after your treatment, we encourage you to take your nausea medication as directed. ? ?BELOW ARE SYMPTOMS THAT SHOULD BE REPORTED IMMEDIATELY: ?*FEVER GREATER THAN 100.4 F (38 ?C) OR HIGHER ?*CHILLS OR SWEATING ?*NAUSEA AND VOMITING THAT IS NOT CONTROLLED WITH YOUR NAUSEA MEDICATION ?*UNUSUAL SHORTNESS OF BREATH ?*UNUSUAL BRUISING OR BLEEDING ?*URINARY PROBLEMS (pain or burning when urinating, or frequent urination) ?*BOWEL PROBLEMS (unusual diarrhea, constipation, pain near the anus) ?TENDERNESS IN MOUTH AND THROAT WITH OR WITHOUT PRESENCE OF ULCERS (sore throat, sores in mouth, or a toothache) ?UNUSUAL RASH, SWELLING OR PAIN  ?UNUSUAL VAGINAL DISCHARGE OR ITCHING  ? ?Items with * indicate a potential emergency and should be followed up as soon as possible or go to the Emergency Department if any problems should occur. ? ?Please show the CHEMOTHERAPY ALERT CARD or IMMUNOTHERAPY ALERT CARD at check-in to the  Emergency Department and triage nurse. ? ?Should you have questions after your visit or need to cancel or reschedule your appointment, please contact [facility]  Dept: [phone]  and follow the prompts.  Office hours are 8:00 a.m. to 4:30 p.m. Monday - Friday. Please note that voicemails left after 4:00 p.m. may not be returned until the following business day.  We are closed weekends and major holidays. You have access to a nurse at all times for urgent questions. Please call the main number to the clinic Dept: [phone] and follow the prompts. ? ? ?For any non-urgent questions, you may also contact your provider using MyChart. We now offer e-Visits for anyone 18 and older to request care online for non-urgent symptoms. For details visit [email or web address]. ?  ?Also download the MyChart app! Go to the app store, search "MyChart", open the app, select [facility], and log in with your MyChart username and password. ? ?Due to Covid, a mask is required upon entering the hospital/clinic. If you do not have a mask, one will be given to you upon arrival. For doctor visits, patients may have 1 support person aged 18 or older with them. For treatment visits, patients cannot have anyone with them due to current Covid guidelines and our immunocompromised population.  ? ?

## 2020-11-29 ENCOUNTER — Inpatient Hospital Stay: Payer: Medicare Other

## 2020-11-29 ENCOUNTER — Inpatient Hospital Stay: Payer: Medicare Other | Attending: Internal Medicine | Admitting: Internal Medicine

## 2020-11-29 ENCOUNTER — Other Ambulatory Visit: Payer: Self-pay

## 2020-11-29 VITALS — BP 150/61 | HR 87 | Temp 96.9°F | Resp 18 | Wt 230.3 lb

## 2020-11-29 DIAGNOSIS — Z5112 Encounter for antineoplastic immunotherapy: Secondary | ICD-10-CM

## 2020-11-29 DIAGNOSIS — C3491 Malignant neoplasm of unspecified part of right bronchus or lung: Secondary | ICD-10-CM

## 2020-11-29 DIAGNOSIS — C787 Secondary malignant neoplasm of liver and intrahepatic bile duct: Secondary | ICD-10-CM | POA: Diagnosis not present

## 2020-11-29 DIAGNOSIS — C778 Secondary and unspecified malignant neoplasm of lymph nodes of multiple regions: Secondary | ICD-10-CM | POA: Insufficient documentation

## 2020-11-29 DIAGNOSIS — C3490 Malignant neoplasm of unspecified part of unspecified bronchus or lung: Secondary | ICD-10-CM | POA: Insufficient documentation

## 2020-11-29 DIAGNOSIS — Z95828 Presence of other vascular implants and grafts: Secondary | ICD-10-CM

## 2020-11-29 DIAGNOSIS — Z79899 Other long term (current) drug therapy: Secondary | ICD-10-CM | POA: Diagnosis not present

## 2020-11-29 DIAGNOSIS — R5383 Other fatigue: Secondary | ICD-10-CM

## 2020-11-29 LAB — CBC WITH DIFFERENTIAL (CANCER CENTER ONLY)
Abs Immature Granulocytes: 0.02 10*3/uL (ref 0.00–0.07)
Basophils Absolute: 0.1 10*3/uL (ref 0.0–0.1)
Basophils Relative: 1 %
Eosinophils Absolute: 0.2 10*3/uL (ref 0.0–0.5)
Eosinophils Relative: 3 %
HCT: 32 % — ABNORMAL LOW (ref 39.0–52.0)
Hemoglobin: 10.4 g/dL — ABNORMAL LOW (ref 13.0–17.0)
Immature Granulocytes: 0 %
Lymphocytes Relative: 42 %
Lymphs Abs: 2.9 10*3/uL (ref 0.7–4.0)
MCH: 29.5 pg (ref 26.0–34.0)
MCHC: 32.5 g/dL (ref 30.0–36.0)
MCV: 90.7 fL (ref 80.0–100.0)
Monocytes Absolute: 0.7 10*3/uL (ref 0.1–1.0)
Monocytes Relative: 11 %
Neutro Abs: 2.9 10*3/uL (ref 1.7–7.7)
Neutrophils Relative %: 43 %
Platelet Count: 218 10*3/uL (ref 150–400)
RBC: 3.53 MIL/uL — ABNORMAL LOW (ref 4.22–5.81)
RDW: 13.7 % (ref 11.5–15.5)
WBC Count: 6.8 10*3/uL (ref 4.0–10.5)
nRBC: 0 % (ref 0.0–0.2)

## 2020-11-29 LAB — CMP (CANCER CENTER ONLY)
ALT: 11 U/L (ref 0–44)
AST: 17 U/L (ref 15–41)
Albumin: 3.5 g/dL (ref 3.5–5.0)
Alkaline Phosphatase: 63 U/L (ref 38–126)
Anion gap: 7 (ref 5–15)
BUN: 25 mg/dL — ABNORMAL HIGH (ref 8–23)
CO2: 24 mmol/L (ref 22–32)
Calcium: 9.1 mg/dL (ref 8.9–10.3)
Chloride: 107 mmol/L (ref 98–111)
Creatinine: 1.93 mg/dL — ABNORMAL HIGH (ref 0.61–1.24)
GFR, Estimated: 38 mL/min — ABNORMAL LOW (ref >60)
Glucose, Bld: 236 mg/dL — ABNORMAL HIGH (ref 70–99)
Potassium: 4 mmol/L (ref 3.5–5.1)
Sodium: 138 mmol/L (ref 135–145)
Total Bilirubin: 0.4 mg/dL (ref 0.3–1.2)
Total Protein: 7.7 g/dL (ref 6.5–8.1)

## 2020-11-29 LAB — TSH: TSH: 0.656 u[IU]/mL (ref 0.320–4.118)

## 2020-11-29 MED ORDER — HEPARIN SOD (PORK) LOCK FLUSH 100 UNIT/ML IV SOLN
500.0000 [IU] | Freq: Once | INTRAVENOUS | Status: AC | PRN
  Administered 2020-11-29: 500 [IU]
  Filled 2020-11-29: qty 5

## 2020-11-29 MED ORDER — SODIUM CHLORIDE 0.9 % IV SOLN
200.0000 mg | Freq: Once | INTRAVENOUS | Status: AC
  Administered 2020-11-29: 200 mg via INTRAVENOUS
  Filled 2020-11-29: qty 8

## 2020-11-29 MED ORDER — SODIUM CHLORIDE 0.9 % IV SOLN
Freq: Once | INTRAVENOUS | Status: AC
  Filled 2020-11-29: qty 250

## 2020-11-29 MED ORDER — SODIUM CHLORIDE 0.9% FLUSH
10.0000 mL | Freq: Once | INTRAVENOUS | Status: AC
  Administered 2020-11-29: 10 mL
  Filled 2020-11-29: qty 10

## 2020-11-29 MED ORDER — SODIUM CHLORIDE 0.9% FLUSH
10.0000 mL | INTRAVENOUS | Status: DC | PRN
  Administered 2020-11-29: 10 mL
  Filled 2020-11-29: qty 10

## 2020-11-29 NOTE — Progress Notes (Signed)
[facility] [phone]   Fax:([phone]) [phone]  OFFICE PROGRESS NOTE  Pcp, No No address on file  DIAGNOSIS: Stage IV (TX, N3, M1c) non-small cell lung cancer, poorly differentiated adenocarcinoma presented with bulky mediastinal lymphadenopathy in addition to right axillary lymphadenopathy and metastatic liver lesions, abdominal lymphadenopathy diagnosed in January 2021. Molecular studies by Guardant 360 that shows no actionable mutations.  PRIOR THERAPY: None  CURRENT THERAPY: Systemic chemotherapy with carboplatin for AUC of 5, Alimta 500 mg/M2 and Keytruda 200 mg IV every 3 weeks.  First dose July 14, 2019.  Status post 24 cycles. Starting from cycle #5 the patient will be treated with maintenance Alimta and Keytruda every 3 weeks.  Starting from cycle #12 he will be on single agent treatment with Keytruda secondary to renal insufficiency.  INTERVAL HISTORY: Kirk [name] 65 y.o. male returns to the clinic today for follow-up visit.  The patient is feeling fine today with no concerning complaints except for mild fatigue.  He was seen recently by his nephrologist for the renal insufficiency.  He denied having any current chest pain, shortness of breath, cough or hemoptysis.  He denied having any fever or chills.  He has no nausea, vomiting, diarrhea or constipation.  He denied having any headache or visual changes.  He is here today for evaluation before starting cycle #25 of his treatment.  MEDICAL HISTORY: Past Medical History:  Diagnosis Date   Arthritis    "right knee" (09/04/2015)   Atrial flutter with rapid ventricular response ([facility]) 04/19/2019   Dizziness 10/2019   Dyspnea    increased exertion   Elevated serum creatinine 07/2020   HTN (hypertension)    Hyperlipidemia    Lung cancer ([facility]) 05/2019   Microalbuminuria 07/2020   NSTEMI (non-ST elevated myocardial infarction) ([facility]) 09/04/2015   a. cath 09/05/2015: 95% stenosis mid-LAD, 55%  mid-RCA, and 40% prox-RCA. PCI performed w/ Synergy DES to LAD   PAD (peripheral artery disease) ([facility])    Stenting of bilateral iliacs in 2003.   Refusal of blood transfusions as patient is Jehovah's Witness    Type II diabetes mellitus ([facility])    Vitamin D deficiency 07/2020    ALLERGIES:  has No Known Allergies.  MEDICATIONS:  Current Outpatient Medications  Medication Sig Dispense Refill   acetaminophen (TYLENOL) 500 MG tablet Take 500 mg by mouth every 6 (six) hours as needed for moderate pain or fever.      aspirin EC 81 MG tablet Take 81 mg by mouth at bedtime.     carvedilol (COREG) 12.5 MG tablet Take 1 tablet (12.5 mg total) by mouth 2 (two) times daily with a meal. 180 tablet 3   cetirizine (ZYRTEC) 10 MG tablet Take 1 tablet (10 mg total) by mouth daily. 90 tablet 3   clopidogrel (PLAVIX) 75 MG tablet Take 1 tablet (75 mg total) by mouth daily. 90 tablet 3   docusate sodium (COLACE) 100 MG capsule Take 1 capsule (100 mg total) by mouth 2 (two) times daily as needed for mild constipation. 60 capsule 11   ferrous sulfate 324 MG TBEC Take 1 tablet (324 mg total) by mouth daily with breakfast. 30 tablet 11   folic acid (FOLVITE) 1 MG tablet Take 1 tablet (1 mg total) by mouth daily. 90 tablet 3   furosemide (LASIX) 20 MG tablet Take 1 tablet (20 mg total) by mouth 2 (two) times daily. 60 tablet 1   gabapentin (NEURONTIN) 300 MG capsule  Take 1 capsule (300 mg total) by mouth 3 (three) times daily. 270 capsule 3   glimepiride (AMARYL) 4 MG tablet Take 1 tablet (4 mg total) by mouth daily. 90 tablet 3   Hydrocortisone Acetate 1 % CREA Apply 15 g topically 2 (two) times daily. 15 g 3   ipratropium (ATROVENT) 0.03 % nasal spray USE 2 SPRAYS IN EACH NOSTRIL TWICE DAILY (Patient taking differently: Place 1 spray into both nostrils daily as needed (allergies).) 30 mL 2   lactulose (CHRONULAC) 10 GM/15ML solution Take 30 mLs (20 g total) by mouth 2 (two) times daily as needed for moderate  constipation. 240 mL 11   lidocaine-prilocaine (EMLA) cream APPLY TO THE PORT-A-CATH SITE 30-60 MINUTES BEFORE TREATMENT. (Patient taking differently: Apply 1 application topically daily as needed (access port). Apply to the Port-A-Cath site 30-60 minutes before treatment.) 30 g 0   meclizine (ANTIVERT) 25 MG tablet Take 1 tablet (25 mg total) by mouth 3 (three) times daily as needed for dizziness. 90 tablet 3   metFORMIN (GLUCOPHAGE) 1000 MG tablet Take 1 tablet (1,000 mg total) by mouth 2 (two) times daily with a meal. 180 tablet 3   multivitamin (ONE-A-DAY MEN'S) TABS tablet Take 1 tablet by mouth daily. 30 tablet 6   nitroGLYCERIN (NITROSTAT) 0.4 MG SL tablet Place 1 tablet (0.4 mg total) under the tongue every 5 (five) minutes x 3 doses as needed for chest pain. 25 tablet 2   oxyCODONE-acetaminophen (PERCOCET/ROXICET) 5-325 MG tablet Take 1 tablet by mouth every 6 (six) hours as needed for severe pain. 20 tablet 0   pantoprazole (PROTONIX) 40 MG tablet Take 1 tablet (40 mg total) by mouth daily. 90 tablet 3   prochlorperazine (COMPAZINE) 10 MG tablet Take 1 tablet (10 mg total) by mouth every 6 (six) hours as needed for nausea or vomiting. 30 tablet 11   rosuvastatin (CRESTOR) 40 MG tablet Take 1 tablet (40 mg total) by mouth daily. 90 tablet 3   Vitamin D, Ergocalciferol, (DRISDOL) 1.25 MG (50000 UNIT) CAPS capsule Take 1 capsule (50,000 Units total) by mouth every 7 (seven) days. 5 capsule 3   No current facility-administered medications for this visit.    SURGICAL HISTORY:  Past Surgical History:  Procedure Laterality Date   CARDIAC CATHETERIZATION N/A 09/05/2015   Procedure: Left Heart Cath and Coronary Angiography;  Surgeon: Troy Sine, MD;  Location: [facility];  Service: Cardiovascular;  Laterality: N/A;   CARDIAC CATHETERIZATION N/A 09/05/2015   Procedure: Coronary Stent Intervention;  Surgeon: Troy Sine, MD;  Location: [facility];  Service: Cardiovascular;   Laterality: N/A;   IR IMAGING GUIDED PORT INSERTION  07/15/2019   ORIF CONGENITAL HIP DISLOCATION Left ~ 1965   "put pins in it"   PERIPHERAL VASCULAR CATHETERIZATION Bilateral 2003   "stenting"   TONSILLECTOMY  ~ [place]:  A comprehensive review of systems was negative except for: Constitutional: positive for fatigue   PHYSICAL EXAMINATION: General appearance: alert, cooperative, appears stated age, and no distress Head: Normocephalic, without obvious abnormality, atraumatic Neck: no adenopathy, no JVD, supple, symmetrical, trachea midline, and thyroid not enlarged, symmetric, no tenderness/mass/nodules Lymph nodes: Cervical, supraclavicular, and axillary nodes normal. Resp: clear to auscultation bilaterally Back: symmetric, no curvature. ROM normal. No CVA tenderness. Cardio: regular rate and rhythm, S1, S2 normal, no murmur, click, rub or gallop GI: soft, non-tender; bowel sounds normal; no masses,  no organomegaly Extremities: extremities normal, atraumatic, no  cyanosis or edema  ECOG PERFORMANCE STATUS: 1 - Symptomatic but completely ambulatory  Blood pressure (!) 150/61, pulse 87, temperature (!) 96.9 F (36.1 C), temperature source Tympanic, resp. rate 18, weight 230 lb 5 oz (104.5 kg), SpO2 98 %.  LABORATORY DATA: Lab Results  Component Value Date   WBC 6.8 11/29/2020   HGB 10.4 (L) 11/29/2020   HCT 32.0 (L) 11/29/2020   MCV 90.7 11/29/2020   PLT 218 11/29/2020      Chemistry      Component Value Date/Time   NA 138 11/29/2020 0919   NA 140 06/08/2015 0840   K 4.0 11/29/2020 0919   CL 107 11/29/2020 0919   CO2 24 11/29/2020 0919   BUN 25 (H) 11/29/2020 0919   BUN 15 06/08/2015 0840   CREATININE 1.93 (H) 11/29/2020 0919   CREATININE 1.06 10/01/2016 0855      Component Value Date/Time   CALCIUM 9.1 11/29/2020 0919   ALKPHOS 63 11/29/2020 0919   AST 17 11/29/2020 0919   ALT 11 11/29/2020 0919   BILITOT 0.4 11/29/2020 0919        RADIOGRAPHIC STUDIES: CT Abdomen Pelvis Wo Contrast  Result Date: 11/07/2020 CLINICAL DATA:  Non-small cell lung cancer staging, ongoing immunotherapy EXAM: CT CHEST, ABDOMEN AND PELVIS WITHOUT CONTRAST TECHNIQUE: Multidetector CT imaging of the chest, abdomen and pelvis was performed following the standard protocol without IV contrast. Oral enteric contrast was administered. COMPARISON:  CT chest abdomen pelvis, 08/14/2020 FINDINGS: CT CHEST FINDINGS Cardiovascular: Right chest port catheter. Aortic atherosclerosis. Normal heart size. Three-vessel coronary artery calcifications and stents. Trace pericardial effusion. Mediastinum/Nodes: No enlarged mediastinal, hilar, or axillary lymph nodes. Thyroid gland, trachea, and esophagus demonstrate no significant findings. Lungs/Pleura: Mild centrilobular emphysema. Diffuse bilateral bronchial wall thickening. Unchanged mild, irregular scarring of the lateral segment right middle lobe. No pleural effusion or pneumothorax. Musculoskeletal: No chest wall mass or suspicious bone lesions identified. CT ABDOMEN PELVIS FINDINGS Hepatobiliary: No solid liver abnormality is seen. Unchanged hypodense subcapsular lesion of the anterior inferior right lobe of the liver, previously characterized as a hemangioma by contrast enhanced examination. No gallstones, gallbladder wall thickening, or biliary dilatation. Pancreas: Unremarkable. No pancreatic ductal dilatation or surrounding inflammatory changes. Spleen: Normal in size without significant abnormality. Adrenals/Urinary Tract: Adrenal glands are unremarkable. Kidneys are normal, without renal calculi, solid lesion, or hydronephrosis. Bladder is unremarkable. Stomach/Bowel: Stomach is within normal limits. Appendix appears normal. No evidence of bowel wall thickening, distention, or inflammatory changes. Vascular/Lymphatic: Aortic atherosclerosis. Bilateral common iliac artery stents. Unchanged enlarged gastrohepatic  ligament lymph nodes measuring up to 2.1 x 1.6 cm (series 2, image 58). Reproductive: No mass or other abnormality. Other: No abdominal wall hernia or abnormality. No abdominopelvic ascites. Musculoskeletal: No acute or significant osseous findings. IMPRESSION: 1. Unchanged enlarged gastrohepatic ligament lymph nodes. No other noncontrast evidence of metastatic disease in the chest, abdomen, or pelvis. 2. Emphysema and diffuse bilateral bronchial wall thickening. 3. Coronary artery disease. Aortic Atherosclerosis (ICD10-I70.0) and Emphysema (ICD10-J43.9). Electronically Signed   By: Eddie Candle M.D.   On: 11/07/2020 13:32   CT Chest Wo Contrast  Result Date: 11/07/2020 CLINICAL DATA:  Non-small cell lung cancer staging, ongoing immunotherapy EXAM: CT CHEST, ABDOMEN AND PELVIS WITHOUT CONTRAST TECHNIQUE: Multidetector CT imaging of the chest, abdomen and pelvis was performed following the standard protocol without IV contrast. Oral enteric contrast was administered. COMPARISON:  CT chest abdomen pelvis, 08/14/2020 FINDINGS: CT CHEST FINDINGS Cardiovascular: Right chest port catheter. Aortic atherosclerosis. Normal heart size.  Three-vessel coronary artery calcifications and stents. Trace pericardial effusion. Mediastinum/Nodes: No enlarged mediastinal, hilar, or axillary lymph nodes. Thyroid gland, trachea, and esophagus demonstrate no significant findings. Lungs/Pleura: Mild centrilobular emphysema. Diffuse bilateral bronchial wall thickening. Unchanged mild, irregular scarring of the lateral segment right middle lobe. No pleural effusion or pneumothorax. Musculoskeletal: No chest wall mass or suspicious bone lesions identified. CT ABDOMEN PELVIS FINDINGS Hepatobiliary: No solid liver abnormality is seen. Unchanged hypodense subcapsular lesion of the anterior inferior right lobe of the liver, previously characterized as a hemangioma by contrast enhanced examination. No gallstones, gallbladder wall thickening, or  biliary dilatation. Pancreas: Unremarkable. No pancreatic ductal dilatation or surrounding inflammatory changes. Spleen: Normal in size without significant abnormality. Adrenals/Urinary Tract: Adrenal glands are unremarkable. Kidneys are normal, without renal calculi, solid lesion, or hydronephrosis. Bladder is unremarkable. Stomach/Bowel: Stomach is within normal limits. Appendix appears normal. No evidence of bowel wall thickening, distention, or inflammatory changes. Vascular/Lymphatic: Aortic atherosclerosis. Bilateral common iliac artery stents. Unchanged enlarged gastrohepatic ligament lymph nodes measuring up to 2.1 x 1.6 cm (series 2, image 58). Reproductive: No mass or other abnormality. Other: No abdominal wall hernia or abnormality. No abdominopelvic ascites. Musculoskeletal: No acute or significant osseous findings. IMPRESSION: 1. Unchanged enlarged gastrohepatic ligament lymph nodes. No other noncontrast evidence of metastatic disease in the chest, abdomen, or pelvis. 2. Emphysema and diffuse bilateral bronchial wall thickening. 3. Coronary artery disease. Aortic Atherosclerosis (ICD10-I70.0) and Emphysema (ICD10-J43.9). Electronically Signed   By: Eddie Candle M.D.   On: 11/07/2020 13:32    ASSESSMENT AND PLAN: This is a very pleasant 65 years old African-American male recently diagnosed with a stage IV (TX, N3, M1c) non-small cell lung cancer, poorly differentiated adenocarcinoma presented with bulky mediastinal as well as right hilar, supraclavicular, abdominal and right axillary lymphadenopathy in addition to metastatic liver lesions diagnosed in January 2021. Molecular studies by guardant 360 shows no actionable mutations.  The patient is not a candidate for treatment with targeted therapy or enrollment in the clinical trial with the [facility] regimen. The patient is currently undergoing systemic chemotherapy with carboplatin for AUC of 5, Alimta 500 mg/M2 and Keytruda 200 mg IV every 3 weeks  status post 24 cycles. Starting from cycle #5 he is treated with maintenance Alimta and Keytruda every 3 weeks.  Starting from cycle #12 the patient has been on treatment with single agent Keytruda because of the renal insufficiency. The patient continues to tolerate this treatment fairly well with no concerning adverse effects. I recommended for him to continue his current treatment and he will proceed with cycle #25 today. The patient will come back for follow-up visit in 3 weeks for evaluation before the next cycle of his treatment. For the anemia of chronic disease, we will continue to monitor his hemoglobin and hematocrit closely.  The patient was also advised to continue with the oral iron tablets. For the chronic renal insufficiency he is followed by nephrology. He was advised to call immediately if he has any concerning symptoms in the interval. The patient voices understanding of current disease status and treatment options and is in agreement with the current care plan.  All questions were answered. The patient knows to call the clinic with any problems, questions or concerns. We can certainly see the patient much sooner if necessary.  Disclaimer: This note was dictated with voice recognition software. Similar sounding words can inadvertently be transcribed and may not be corrected upon review.

## 2020-11-29 NOTE — Patient Instructions (Signed)

## 2020-11-29 NOTE — Progress Notes (Signed)
Per Dr. Worthy Flank office visit note, okay to proceed with treatment today.

## 2020-11-29 NOTE — Patient Instructions (Signed)
[facility]  Discharge Instructions: Thank you for choosing [facility] to provide your oncology and hematology care.   If you have a lab appointment with the [facility], please go directly to the [facility] and check in at the registration area.   Wear comfortable clothing and clothing appropriate for easy access to any Portacath or PICC line.   We strive to give you quality time with your provider. You may need to reschedule your appointment if you arrive late (15 or more minutes).  Arriving late affects you and other patients whose appointments are after yours.  Also, if you miss three or more appointments without notifying the office, you may be dismissed from the clinic at the provider's discretion.      For prescription refill requests, have your pharmacy contact our office and allow 72 hours for refills to be completed.    Today you received the following chemotherapy and/or immunotherapy agents: Beryle Flock      To help prevent nausea and vomiting after your treatment, we encourage you to take your nausea medication as directed.  BELOW ARE SYMPTOMS THAT SHOULD BE REPORTED IMMEDIATELY: *FEVER GREATER THAN 100.4 F (38 C) OR HIGHER *CHILLS OR SWEATING *NAUSEA AND VOMITING THAT IS NOT CONTROLLED WITH YOUR NAUSEA MEDICATION *UNUSUAL SHORTNESS OF BREATH *UNUSUAL BRUISING OR BLEEDING *URINARY PROBLEMS (pain or burning when urinating, or frequent urination) *BOWEL PROBLEMS (unusual diarrhea, constipation, pain near the anus) TENDERNESS IN MOUTH AND THROAT WITH OR WITHOUT PRESENCE OF ULCERS (sore throat, sores in mouth, or a toothache) UNUSUAL RASH, SWELLING OR PAIN  UNUSUAL VAGINAL DISCHARGE OR ITCHING   Items with * indicate a potential emergency and should be followed up as soon as possible or go to the Emergency Department if any problems should occur.  Please show the CHEMOTHERAPY ALERT CARD or IMMUNOTHERAPY ALERT CARD at check-in to  the Emergency Department and triage nurse.  Should you have questions after your visit or need to cancel or reschedule your appointment, please contact [facility]  Dept: [phone]  and follow the prompts.  Office hours are 8:00 a.m. to 4:30 p.m. Monday - Friday. Please note that voicemails left after 4:00 p.m. may not be returned until the following business day.  We are closed weekends and major holidays. You have access to a nurse at all times for urgent questions. Please call the main number to the clinic Dept: [phone] and follow the prompts.   For any non-urgent questions, you may also contact your provider using MyChart. We now offer e-Visits for anyone 50 and older to request care online for non-urgent symptoms. For details visit [email or web address].   Also download the MyChart app! Go to the app store, search "MyChart", open the app, select [facility], and log in with your MyChart username and password.  Due to Covid, a mask is required upon entering the hospital/clinic. If you do not have a mask, one will be given to you upon arrival. For doctor visits, patients may have 1 support person aged 62 or older with them. For treatment visits, patients cannot have anyone with them due to current Covid guidelines and our immunocompromised population.

## 2020-12-06 ENCOUNTER — Telehealth: Payer: Self-pay | Admitting: Internal Medicine

## 2020-12-06 NOTE — Telephone Encounter (Signed)
Scheduled per 07/06 los, patient has been called and notified regarding upcoming appointments.

## 2020-12-21 ENCOUNTER — Inpatient Hospital Stay: Payer: Medicare Other

## 2020-12-21 ENCOUNTER — Other Ambulatory Visit: Payer: Self-pay

## 2020-12-21 ENCOUNTER — Inpatient Hospital Stay (HOSPITAL_BASED_OUTPATIENT_CLINIC_OR_DEPARTMENT_OTHER): Payer: Medicare Other | Admitting: Internal Medicine

## 2020-12-21 ENCOUNTER — Encounter: Payer: Self-pay | Admitting: Internal Medicine

## 2020-12-21 VITALS — BP 132/59 | HR 82 | Temp 97.8°F | Resp 17 | Wt 228.1 lb

## 2020-12-21 DIAGNOSIS — C3491 Malignant neoplasm of unspecified part of right bronchus or lung: Secondary | ICD-10-CM

## 2020-12-21 DIAGNOSIS — R5383 Other fatigue: Secondary | ICD-10-CM

## 2020-12-21 DIAGNOSIS — Z5112 Encounter for antineoplastic immunotherapy: Secondary | ICD-10-CM

## 2020-12-21 DIAGNOSIS — C787 Secondary malignant neoplasm of liver and intrahepatic bile duct: Secondary | ICD-10-CM | POA: Diagnosis not present

## 2020-12-21 DIAGNOSIS — C3490 Malignant neoplasm of unspecified part of unspecified bronchus or lung: Secondary | ICD-10-CM

## 2020-12-21 DIAGNOSIS — E559 Vitamin D deficiency, unspecified: Secondary | ICD-10-CM

## 2020-12-21 DIAGNOSIS — Z95828 Presence of other vascular implants and grafts: Secondary | ICD-10-CM

## 2020-12-21 LAB — CMP (CANCER CENTER ONLY)
ALT: 12 U/L (ref 0–44)
AST: 17 U/L (ref 15–41)
Albumin: 3.9 g/dL (ref 3.5–5.0)
Alkaline Phosphatase: 70 U/L (ref 38–126)
Anion gap: 8 (ref 5–15)
BUN: 26 mg/dL — ABNORMAL HIGH (ref 8–23)
CO2: 25 mmol/L (ref 22–32)
Calcium: 9.8 mg/dL (ref 8.9–10.3)
Chloride: 106 mmol/L (ref 98–111)
Creatinine: 1.94 mg/dL — ABNORMAL HIGH (ref 0.61–1.24)
GFR, Estimated: 37 mL/min — ABNORMAL LOW (ref >60)
Glucose, Bld: 224 mg/dL — ABNORMAL HIGH (ref 70–99)
Potassium: 4.5 mmol/L (ref 3.5–5.1)
Sodium: 139 mmol/L (ref 135–145)
Total Bilirubin: 0.3 mg/dL (ref 0.3–1.2)
Total Protein: 8.1 g/dL (ref 6.5–8.1)

## 2020-12-21 LAB — CBC WITH DIFFERENTIAL (CANCER CENTER ONLY)
Abs Immature Granulocytes: 0 10*3/uL (ref 0.00–0.07)
Basophils Absolute: 0.1 10*3/uL (ref 0.0–0.1)
Basophils Relative: 1 %
Eosinophils Absolute: 0.2 10*3/uL (ref 0.0–0.5)
Eosinophils Relative: 3 %
HCT: 32.3 % — ABNORMAL LOW (ref 39.0–52.0)
Hemoglobin: 10.5 g/dL — ABNORMAL LOW (ref 13.0–17.0)
Immature Granulocytes: 0 %
Lymphocytes Relative: 42 %
Lymphs Abs: 2.6 10*3/uL (ref 0.7–4.0)
MCH: 29.5 pg (ref 26.0–34.0)
MCHC: 32.5 g/dL (ref 30.0–36.0)
MCV: 90.7 fL (ref 80.0–100.0)
Monocytes Absolute: 0.6 10*3/uL (ref 0.1–1.0)
Monocytes Relative: 9 %
Neutro Abs: 2.8 10*3/uL (ref 1.7–7.7)
Neutrophils Relative %: 45 %
Platelet Count: 225 10*3/uL (ref 150–400)
RBC: 3.56 MIL/uL — ABNORMAL LOW (ref 4.22–5.81)
RDW: 13.3 % (ref 11.5–15.5)
WBC Count: 6.1 10*3/uL (ref 4.0–10.5)
nRBC: 0 % (ref 0.0–0.2)

## 2020-12-21 LAB — TSH: TSH: 1.026 u[IU]/mL (ref 0.320–4.118)

## 2020-12-21 MED ORDER — VITAMIN D (ERGOCALCIFEROL) 1.25 MG (50000 UNIT) PO CAPS: 50000.0000 [IU] | ORAL_CAPSULE | ORAL | 3 refills | Status: DC

## 2020-12-21 MED ORDER — SODIUM CHLORIDE 0.9% FLUSH
10.0000 mL | INTRAVENOUS | Status: DC | PRN
  Administered 2020-12-21: 10 mL
  Filled 2020-12-21: qty 10

## 2020-12-21 MED ORDER — SODIUM CHLORIDE 0.9 % IV SOLN
Freq: Once | INTRAVENOUS | Status: AC
  Filled 2020-12-21: qty 250

## 2020-12-21 MED ORDER — HEPARIN SOD (PORK) LOCK FLUSH 100 UNIT/ML IV SOLN
500.0000 [IU] | Freq: Once | INTRAVENOUS | Status: AC | PRN
  Administered 2020-12-21: 500 [IU]
  Filled 2020-12-21: qty 5

## 2020-12-21 MED ORDER — SODIUM CHLORIDE 0.9 % IV SOLN
200.0000 mg | Freq: Once | INTRAVENOUS | Status: AC
  Administered 2020-12-21: 200 mg via INTRAVENOUS
  Filled 2020-12-21: qty 8

## 2020-12-21 MED ORDER — SODIUM CHLORIDE 0.9% FLUSH
10.0000 mL | Freq: Once | INTRAVENOUS | Status: AC
  Administered 2020-12-21: 10 mL
  Filled 2020-12-21: qty 10

## 2020-12-21 NOTE — Progress Notes (Signed)
Ok to treat with elevated Scr per Dr.Mohamed

## 2020-12-21 NOTE — Patient Instructions (Signed)
[facility]  Discharge Instructions: Thank you for choosing [facility] to provide your oncology and hematology care.   If you have a lab appointment with the [facility], please go directly to the [facility] and check in at the registration area.   Wear comfortable clothing and clothing appropriate for easy access to any Portacath or PICC line.   We strive to give you quality time with your provider. You may need to reschedule your appointment if you arrive late (15 or more minutes).  Arriving late affects you and other patients whose appointments are after yours.  Also, if you miss three or more appointments without notifying the office, you may be dismissed from the clinic at the provider's discretion.      For prescription refill requests, have your pharmacy contact our office and allow 72 hours for refills to be completed.    Today you received the following chemotherapy and/or immunotherapy agents Beryle Flock      To help prevent nausea and vomiting after your treatment, we encourage you to take your nausea medication as directed.  BELOW ARE SYMPTOMS THAT SHOULD BE REPORTED IMMEDIATELY: *FEVER GREATER THAN 100.4 F (38 C) OR HIGHER *CHILLS OR SWEATING *NAUSEA AND VOMITING THAT IS NOT CONTROLLED WITH YOUR NAUSEA MEDICATION *UNUSUAL SHORTNESS OF BREATH *UNUSUAL BRUISING OR BLEEDING *URINARY PROBLEMS (pain or burning when urinating, or frequent urination) *BOWEL PROBLEMS (unusual diarrhea, constipation, pain near the anus) TENDERNESS IN MOUTH AND THROAT WITH OR WITHOUT PRESENCE OF ULCERS (sore throat, sores in mouth, or a toothache) UNUSUAL RASH, SWELLING OR PAIN  UNUSUAL VAGINAL DISCHARGE OR ITCHING   Items with * indicate a potential emergency and should be followed up as soon as possible or go to the Emergency Department if any problems should occur.  Please show the CHEMOTHERAPY ALERT CARD or IMMUNOTHERAPY ALERT CARD at check-in to  the Emergency Department and triage nurse.  Should you have questions after your visit or need to cancel or reschedule your appointment, please contact [facility]  Dept: [phone]  and follow the prompts.  Office hours are 8:00 a.m. to 4:30 p.m. Monday - Friday. Please note that voicemails left after 4:00 p.m. may not be returned until the following business day.  We are closed weekends and major holidays. You have access to a nurse at all times for urgent questions. Please call the main number to the clinic Dept: [phone] and follow the prompts.   For any non-urgent questions, you may also contact your provider using MyChart. We now offer e-Visits for anyone 23 and older to request care online for non-urgent symptoms. For details visit [email or web address].   Also download the MyChart app! Go to the app store, search "MyChart", open the app, select [facility], and log in with your MyChart username and password.  Due to Covid, a mask is required upon entering the hospital/clinic. If you do not have a mask, one will be given to you upon arrival. For doctor visits, patients may have 1 support person aged 20 or older with them. For treatment visits, patients cannot have anyone with them due to current Covid guidelines and our immunocompromised population.

## 2020-12-21 NOTE — Progress Notes (Signed)
[place] [phone]   [phone]  OFFICE PROGRESS NOTE  Pcp, No No address on file  DIAGNOSIS: Stage IV (TX, N3, M1c) non-small cell lung cancer, poorly differentiated adenocarcinoma presented with bulky mediastinal lymphadenopathy in addition to right axillary lymphadenopathy and metastatic liver lesions, abdominal lymphadenopathy diagnosed in January 2021. Molecular studies by [employer] that shows no actionable mutations.  PRIOR THERAPY: None  CURRENT THERAPY: Systemic chemotherapy with carboplatin for AUC of 5, Alimta 500 mg/M2 and Keytruda 200 mg IV every 3 weeks.  First dose July 14, 2019.  Status post 25 cycles. Starting from cycle #5 the patient will be treated with maintenance Alimta and Keytruda every 3 weeks.  Starting from cycle #12 he will be on single agent treatment with Keytruda secondary to renal insufficiency.  INTERVAL HISTORY: Kirk [name] 65 y.o. male returns to the clinic today for follow-up visit.  The patient has no complaints today.  He denied having any current chest pain, shortness of breath, cough or hemoptysis.  He denied having any fever or chills.  He has no nausea, vomiting, diarrhea or constipation.  He has no headache or visual changes.  He has no weight loss or night sweats.  He continues to tolerate his treatment with Keytruda fairly well.  The patient is here today for evaluation before starting cycle #26.  MEDICAL HISTORY: Past Medical History:  Diagnosis Date   Arthritis    "right knee" (09/04/2015)   Atrial flutter with rapid ventricular response ([place]) 04/19/2019   Dizziness 10/2019   Dyspnea    increased exertion   Elevated serum creatinine 07/2020   HTN (hypertension)    Hyperlipidemia    Lung cancer ([place]) 05/2019   Microalbuminuria 07/2020   NSTEMI (non-ST elevated myocardial infarction) ([place]) 09/04/2015   a. cath 09/05/2015: 95% stenosis mid-LAD, 55% mid-RCA, and 40% prox-RCA. PCI  performed w/ Synergy DES to LAD   PAD (peripheral artery disease) ([facility])    Stenting of bilateral iliacs in 2003.   Refusal of blood transfusions as patient is Jehovah's Witness    Type II diabetes mellitus ([place])    Vitamin D deficiency 07/2020    ALLERGIES:  has No Known Allergies.  MEDICATIONS:  Current Outpatient Medications  Medication Sig Dispense Refill   acetaminophen (TYLENOL) 500 MG tablet Take 500 mg by mouth every 6 (six) hours as needed for moderate pain or fever.      aspirin EC 81 MG tablet Take 81 mg by mouth at bedtime.     carvedilol (COREG) 12.5 MG tablet Take 1 tablet (12.5 mg total) by mouth 2 (two) times daily with a meal. 180 tablet 3   cetirizine (ZYRTEC) 10 MG tablet Take 1 tablet (10 mg total) by mouth daily. 90 tablet 3   clopidogrel (PLAVIX) 75 MG tablet Take 1 tablet (75 mg total) by mouth daily. 90 tablet 3   docusate sodium (COLACE) 100 MG capsule Take 1 capsule (100 mg total) by mouth 2 (two) times daily as needed for mild constipation. 60 capsule 11   ferrous sulfate 324 MG TBEC Take 1 tablet (324 mg total) by mouth daily with breakfast. 30 tablet 11   folic acid (FOLVITE) 1 MG tablet Take 1 tablet (1 mg total) by mouth daily. 90 tablet 3   furosemide (LASIX) 20 MG tablet Take 1 tablet (20 mg total) by mouth 2 (two) times daily. 60 tablet 1   gabapentin (NEURONTIN) 300 MG capsule Take 1 capsule (  300 mg total) by mouth 3 (three) times daily. 270 capsule 3   glimepiride (AMARYL) 4 MG tablet Take 1 tablet (4 mg total) by mouth daily. 90 tablet 3   Hydrocortisone Acetate 1 % CREA Apply 15 g topically 2 (two) times daily. 15 g 3   ipratropium (ATROVENT) 0.03 % nasal spray USE 2 SPRAYS IN EACH NOSTRIL TWICE DAILY (Patient taking differently: Place 1 spray into both nostrils daily as needed (allergies).) 30 mL 2   lactulose (CHRONULAC) 10 GM/15ML solution Take 30 mLs (20 g total) by mouth 2 (two) times daily as needed for moderate constipation. 240 mL 11    lidocaine-prilocaine (EMLA) cream APPLY TO THE PORT-A-CATH SITE 30-60 MINUTES BEFORE TREATMENT. (Patient taking differently: Apply 1 application topically daily as needed (access port). Apply to the Port-A-Cath site 30-60 minutes before treatment.) 30 g 0   lisinopril (ZESTRIL) 5 MG tablet Take 5 mg by mouth daily.     meclizine (ANTIVERT) 25 MG tablet Take 1 tablet (25 mg total) by mouth 3 (three) times daily as needed for dizziness. 90 tablet 3   metFORMIN (GLUCOPHAGE) 1000 MG tablet Take 1 tablet (1,000 mg total) by mouth 2 (two) times daily with a meal. 180 tablet 3   multivitamin (ONE-A-DAY MEN'S) TABS tablet Take 1 tablet by mouth daily. 30 tablet 6   nitroGLYCERIN (NITROSTAT) 0.4 MG SL tablet Place 1 tablet (0.4 mg total) under the tongue every 5 (five) minutes x 3 doses as needed for chest pain. 25 tablet 2   oxyCODONE-acetaminophen (PERCOCET/ROXICET) 5-325 MG tablet Take 1 tablet by mouth every 6 (six) hours as needed for severe pain. 20 tablet 0   pantoprazole (PROTONIX) 40 MG tablet Take 1 tablet (40 mg total) by mouth daily. 90 tablet 3   prochlorperazine (COMPAZINE) 10 MG tablet Take 1 tablet (10 mg total) by mouth every 6 (six) hours as needed for nausea or vomiting. 30 tablet 11   rosuvastatin (CRESTOR) 40 MG tablet Take 1 tablet (40 mg total) by mouth daily. 90 tablet 3   Vitamin D, Ergocalciferol, (DRISDOL) 1.25 MG (50000 UNIT) CAPS capsule Take 1 capsule (50,000 Units total) by mouth every 7 (seven) days. 5 capsule 3   No current facility-administered medications for this visit.    SURGICAL HISTORY:  Past Surgical History:  Procedure Laterality Date   CARDIAC CATHETERIZATION N/A 09/05/2015   Procedure: Left Heart Cath and Coronary Angiography;  Surgeon: Troy Sine, MD;  Location: [facility];  Service: Cardiovascular;  Laterality: N/A;   CARDIAC CATHETERIZATION N/A 09/05/2015   Procedure: Coronary Stent Intervention;  Surgeon: Troy Sine, MD;  Location: [facility];  Service: Cardiovascular;  Laterality: N/A;   IR IMAGING GUIDED PORT INSERTION  07/15/2019   ORIF CONGENITAL HIP DISLOCATION Left ~ 1965   "put pins in it"   PERIPHERAL VASCULAR CATHETERIZATION Bilateral 2003   "stenting"   TONSILLECTOMY  ~ [place]:  A comprehensive review of systems was negative.   PHYSICAL EXAMINATION: General appearance: alert, cooperative, appears stated age, and no distress Head: Normocephalic, without obvious abnormality, atraumatic Neck: no adenopathy, no JVD, supple, symmetrical, trachea midline, and thyroid not enlarged, symmetric, no tenderness/mass/nodules Lymph nodes: Cervical, supraclavicular, and axillary nodes normal. Resp: clear to auscultation bilaterally Back: symmetric, no curvature. ROM normal. No CVA tenderness. Cardio: regular rate and rhythm, S1, S2 normal, no murmur, click, rub or gallop GI: soft, non-tender; bowel sounds normal; no masses,  no  organomegaly Extremities: extremities normal, atraumatic, no cyanosis or edema  ECOG PERFORMANCE STATUS: 1 - Symptomatic but completely ambulatory  Blood pressure (!) 132/59, pulse 82, temperature 97.8 F (36.6 C), temperature source Oral, resp. rate 17, weight 228 lb 1.6 oz (103.5 kg), SpO2 100 %.  LABORATORY DATA: Lab Results  Component Value Date   WBC 6.1 12/21/2020   HGB 10.5 (L) 12/21/2020   HCT 32.3 (L) 12/21/2020   MCV 90.7 12/21/2020   PLT 225 12/21/2020      Chemistry      Component Value Date/Time   NA 138 11/29/2020 0919   NA 140 06/08/2015 0840   K 4.0 11/29/2020 0919   CL 107 11/29/2020 0919   CO2 24 11/29/2020 0919   BUN 25 (H) 11/29/2020 0919   BUN 15 06/08/2015 0840   CREATININE 1.93 (H) 11/29/2020 0919   CREATININE 1.06 10/01/2016 0855      Component Value Date/Time   CALCIUM 9.1 11/29/2020 0919   ALKPHOS 63 11/29/2020 0919   AST 17 11/29/2020 0919   ALT 11 11/29/2020 0919   BILITOT 0.4 11/29/2020 0919       RADIOGRAPHIC STUDIES: No  results found.  ASSESSMENT AND PLAN: This is a very pleasant 65 years old African-American male recently diagnosed with a stage IV (TX, N3, M1c) non-small cell lung cancer, poorly differentiated adenocarcinoma presented with bulky mediastinal as well as right hilar, supraclavicular, abdominal and right axillary lymphadenopathy in addition to metastatic liver lesions diagnosed in January 2021. Molecular studies by [employer] shows no actionable mutations.  The patient is not a candidate for treatment with targeted therapy or enrollment in the clinical trial with the [facility] regimen. The patient is currently undergoing systemic chemotherapy with carboplatin for AUC of 5, Alimta 500 mg/M2 and Keytruda 200 mg IV every 3 weeks status post 25 cycles. Starting from cycle #5 he is treated with maintenance Alimta and Keytruda every 3 weeks.  Starting from cycle #12 the patient has been on treatment with single agent Keytruda because of the renal insufficiency. The patient has been tolerating this treatment well with no concerning adverse effects. I recommended for him to proceed with cycle #26 today as planned. I will see him back for follow-up visit in 3 weeks for evaluation with repeat blood work before the next cycle of his treatment. For the anemia of chronic disease, we will continue to monitor his hemoglobin and hematocrit closely.  The patient was also advised to continue with the oral iron tablets. For the chronic renal insufficiency he is followed by nephrology. The patient was advised to call immediately if he has any concerning symptoms in the interval. The patient voices understanding of current disease status and treatment options and is in agreement with the current care plan.  All questions were answered. The patient knows to call the clinic with any problems, questions or concerns. We can certainly see the patient much sooner if necessary.  Disclaimer: This note was dictated with voice  recognition software. Similar sounding words can inadvertently be transcribed and may not be corrected upon review.

## 2020-12-22 ENCOUNTER — Ambulatory Visit: Payer: Medicare Other | Admitting: Family Medicine

## 2020-12-25 ENCOUNTER — Encounter: Payer: Self-pay | Admitting: Nurse Practitioner

## 2020-12-25 ENCOUNTER — Ambulatory Visit (INDEPENDENT_AMBULATORY_CARE_PROVIDER_SITE_OTHER): Payer: Medicare Other | Admitting: Nurse Practitioner

## 2020-12-25 ENCOUNTER — Ambulatory Visit: Payer: Medicare Other | Admitting: Family Medicine

## 2020-12-25 ENCOUNTER — Other Ambulatory Visit: Payer: Self-pay

## 2020-12-25 VITALS — BP 136/53 | HR 91 | Temp 97.5°F | Ht 75.0 in | Wt 229.0 lb

## 2020-12-25 DIAGNOSIS — E119 Type 2 diabetes mellitus without complications: Secondary | ICD-10-CM

## 2020-12-25 DIAGNOSIS — I2510 Atherosclerotic heart disease of native coronary artery without angina pectoris: Secondary | ICD-10-CM | POA: Diagnosis not present

## 2020-12-25 DIAGNOSIS — T451X5A Adverse effect of antineoplastic and immunosuppressive drugs, initial encounter: Secondary | ICD-10-CM

## 2020-12-25 DIAGNOSIS — J302 Other seasonal allergic rhinitis: Secondary | ICD-10-CM

## 2020-12-25 DIAGNOSIS — E1151 Type 2 diabetes mellitus with diabetic peripheral angiopathy without gangrene: Secondary | ICD-10-CM | POA: Diagnosis not present

## 2020-12-25 DIAGNOSIS — R600 Localized edema: Secondary | ICD-10-CM

## 2020-12-25 DIAGNOSIS — D702 Other drug-induced agranulocytosis: Secondary | ICD-10-CM

## 2020-12-25 DIAGNOSIS — K5900 Constipation, unspecified: Secondary | ICD-10-CM

## 2020-12-25 DIAGNOSIS — I517 Cardiomegaly: Secondary | ICD-10-CM | POA: Diagnosis not present

## 2020-12-25 DIAGNOSIS — Z125 Encounter for screening for malignant neoplasm of prostate: Secondary | ICD-10-CM

## 2020-12-25 DIAGNOSIS — I10 Essential (primary) hypertension: Secondary | ICD-10-CM

## 2020-12-25 DIAGNOSIS — G620 Drug-induced polyneuropathy: Secondary | ICD-10-CM

## 2020-12-25 DIAGNOSIS — D649 Anemia, unspecified: Secondary | ICD-10-CM

## 2020-12-25 LAB — POCT URINALYSIS DIPSTICK
Bilirubin, UA: NEGATIVE
Glucose, UA: POSITIVE — AB
Ketones, UA: NEGATIVE
Leukocytes, UA: NEGATIVE
Nitrite, UA: NEGATIVE
Protein, UA: POSITIVE — AB
Spec Grav, UA: >=1.03 — AB (ref 1.010–1.025)
Urobilinogen, UA: 0.2 E.U./dL
pH, UA: 5.5 (ref 5.0–8.0)

## 2020-12-25 LAB — POCT GLYCOSYLATED HEMOGLOBIN (HGB A1C): Hemoglobin A1C: 7.1 % — AB (ref 4.0–5.6)

## 2020-12-25 MED ORDER — ROSUVASTATIN CALCIUM 40 MG PO TABS: 40.0000 mg | ORAL_TABLET | Freq: Every day | ORAL | 3 refills | Status: DC

## 2020-12-25 MED ORDER — METFORMIN HCL 1000 MG PO TABS: 1000.0000 mg | ORAL_TABLET | Freq: Two times a day (BID) | ORAL | 3 refills | Status: DC

## 2020-12-25 MED ORDER — CARVEDILOL 12.5 MG PO TABS: 12.5000 mg | ORAL_TABLET | Freq: Two times a day (BID) | ORAL | 3 refills | Status: DC

## 2020-12-25 MED ORDER — GABAPENTIN 300 MG PO CAPS: 300.0000 mg | ORAL_CAPSULE | Freq: Three times a day (TID) | ORAL | 3 refills | Status: DC

## 2020-12-25 MED ORDER — CLOPIDOGREL BISULFATE 75 MG PO TABS
75.0000 mg | ORAL_TABLET | Freq: Every day | ORAL | 3 refills | Status: DC
Start: 2020-12-25 — End: 2021-12-03

## 2020-12-25 MED ORDER — GLIMEPIRIDE 4 MG PO TABS: 4.0000 mg | ORAL_TABLET | Freq: Every day | ORAL | 3 refills | Status: DC

## 2020-12-25 MED ORDER — DOCUSATE SODIUM 100 MG PO CAPS: 100.0000 mg | ORAL_CAPSULE | Freq: Two times a day (BID) | ORAL | 3 refills | Status: DC | PRN

## 2020-12-25 NOTE — Progress Notes (Signed)
[place], [place]  [address] Phone:  [phone]   Fax:  [phone]   Established Patient Office Visit  Subjective:  Patient ID: Kirk [name], male    DOB: Mar 17, 1956  Age: 65 y.o. MRN: [id number]  CC:  Chief Complaint  Patient presents with   Follow-up    No questions or concerns    HPI Kirk [name] presents for follow up. He  has a past medical history of Arthritis, Atrial flutter with rapid ventricular response ([facility]) (04/19/2019), Dizziness (10/2019), Dyspnea, Elevated serum creatinine (07/2020), HTN (hypertension), Hyperlipidemia, Lung cancer ([facility]) (05/2019), Microalbuminuria (07/2020), NSTEMI (non-ST elevated myocardial infarction) ([facility]) (09/04/2015), PAD (peripheral artery disease) ([facility]), Refusal of blood transfusions as patient is Jehovah's Witness, Type II diabetes mellitus ([place]), and Vitamin D deficiency (07/2020).   Diabetes Mellitus Patient presents for follow up of diabetes. Current symptoms include: none. Symptoms have stabilized. Patient denies foot ulcerations, hypoglycemia , nausea, polydipsia, polyuria, vomiting, and weight loss. Evaluation to date has included: fasting lipid panel and hemoglobin A1C.  Home sugars: patient does not check sugars. Current treatment: more intensive attention to diet which has been effective, Continued sulfonylurea which has been effective, Continued metformin which has been effective, Continued statin which has been effective, and Continued ACE inhibitor/ARB which has been effective. Last dilated eye exam:2022  Hypertension Patient is here for follow-up of elevated blood pressure. He is not exercising he walks the dog and is adherent to a low-salt diet. Blood pressure is not well controlled at home. Cardiac symptoms: none. Patient denies chest pain, dyspnea, exertional chest pressure/discomfort, irregular heart beat, lower extremity edema, paroxysmal nocturnal dyspnea, and syncope.  Cardiovascular risk factors: advanced age (older than 57 for men, 7 for women), diabetes mellitus, dyslipidemia, hypertension, male gender, and microalbuminuria. Use of agents associated with hypertension: none. History of target organ damage: angina/ prior myocardial infarction and peripheral artery disease.  He has completed his chemotherapy treatment for lung cancer he continues on Pembrolizumab until 11/22. He is tolerating this well.  Past Medical History:  Diagnosis Date   Arthritis    "right knee" (09/04/2015)   Atrial flutter with rapid ventricular response ([facility]) 04/19/2019   Dizziness 10/2019   Dyspnea    increased exertion   Elevated serum creatinine 07/2020   HTN (hypertension)    Hyperlipidemia    Lung cancer ([facility]) 05/2019   Microalbuminuria 07/2020   NSTEMI (non-ST elevated myocardial infarction) ([facility]) 09/04/2015   a. cath 09/05/2015: 95% stenosis mid-LAD, 55% mid-RCA, and 40% prox-RCA. PCI performed w/ Synergy DES to LAD   PAD (peripheral artery disease) ([facility])    Stenting of bilateral iliacs in 2003.   Refusal of blood transfusions as patient is Jehovah's Witness    Type II diabetes mellitus ([facility])    Vitamin D deficiency 07/2020    Past Surgical History:  Procedure Laterality Date   CARDIAC CATHETERIZATION N/A 09/05/2015   Procedure: Left Heart Cath and Coronary Angiography;  Surgeon: Troy Sine, MD;  Location: [facility];  Service: Cardiovascular;  Laterality: N/A;   CARDIAC CATHETERIZATION N/A 09/05/2015   Procedure: Coronary Stent Intervention;  Surgeon: Troy Sine, MD;  Location: [facility];  Service: Cardiovascular;  Laterality: N/A;   IR IMAGING GUIDED PORT INSERTION  07/15/2019   ORIF CONGENITAL HIP DISLOCATION Left ~ 1965   "put pins in it"   PERIPHERAL VASCULAR CATHETERIZATION Bilateral 2003   "stenting"   TONSILLECTOMY  ~ 1963  Family History  Problem Relation Age of Onset   Lymphoma Mother    Cancer - Prostate Father    CAD  Neg Hx    Colon cancer Neg Hx    Rectal cancer Neg Hx    Stomach cancer Neg Hx    Esophageal cancer Neg Hx     Social History   Socioeconomic History   Marital status: Married    Spouse name: Not on file   Number of children: 1   Years of education: Not on file   Highest education level: Not on file  Occupational History   Occupation: Furniture salesman  Tobacco Use   Smoking status: Former    Packs/day: 2.00    Years: 35.00    Pack years: 70.00    Types: Cigarettes    Quit date: 02/19/2003    Years since quitting: 17.8   Smokeless tobacco: Never  Vaping Use   Vaping Use: Never used  Substance and Sexual Activity   Alcohol use: Not Currently    Alcohol/week: 0.0 standard drinks   Drug use: Not Currently    Types: Marijuana   Sexual activity: Not Currently  Other Topics Concern   Not on file  Social History Narrative   Not on file   Social Determinants of Health   Financial Resource Strain: Not on file  Food Insecurity: Not on file  Transportation Needs: Not on file  Physical Activity: Not on file  Stress: Not on file  Social Connections: Not on file  Intimate Partner Violence: Not on file    Outpatient Medications Prior to Visit  Medication Sig Dispense Refill   acetaminophen (TYLENOL) 500 MG tablet Take 500 mg by mouth every 6 (six) hours as needed for moderate pain or fever.      aspirin EC 81 MG tablet Take 81 mg by mouth at bedtime.     cetirizine (ZYRTEC) 10 MG tablet Take 1 tablet (10 mg total) by mouth daily. 90 tablet 3   ferrous sulfate 324 MG TBEC Take 1 tablet (324 mg total) by mouth daily with breakfast. 30 tablet 11   folic acid (FOLVITE) 1 MG tablet Take 1 tablet (1 mg total) by mouth daily. 90 tablet 3   Hydrocortisone Acetate 1 % CREA Apply 15 g topically 2 (two) times daily. 15 g 3   lactulose (CHRONULAC) 10 GM/15ML solution Take 30 mLs (20 g total) by mouth 2 (two) times daily as needed for moderate constipation. 240 mL 11    lidocaine-prilocaine (EMLA) cream APPLY TO THE PORT-A-CATH SITE 30-60 MINUTES BEFORE TREATMENT. (Patient taking differently: Apply 1 application topically daily as needed (access port). Apply to the Port-A-Cath site 30-60 minutes before treatment.) 30 g 0   lisinopril (ZESTRIL) 5 MG tablet Take 5 mg by mouth daily.     meclizine (ANTIVERT) 25 MG tablet Take 1 tablet (25 mg total) by mouth 3 (three) times daily as needed for dizziness. 90 tablet 3   multivitamin (ONE-A-DAY MEN'S) TABS tablet Take 1 tablet by mouth daily. 30 tablet 6   nitroGLYCERIN (NITROSTAT) 0.4 MG SL tablet Place 1 tablet (0.4 mg total) under the tongue every 5 (five) minutes x 3 doses as needed for chest pain. 25 tablet 2   oxyCODONE-acetaminophen (PERCOCET/ROXICET) 5-325 MG tablet Take 1 tablet by mouth every 6 (six) hours as needed for severe pain. 20 tablet 0   pantoprazole (PROTONIX) 40 MG tablet Take 1 tablet (40 mg total) by mouth daily. 90 tablet 3   prochlorperazine (  COMPAZINE) 10 MG tablet Take 1 tablet (10 mg total) by mouth every 6 (six) hours as needed for nausea or vomiting. 30 tablet 11   Vitamin D, Ergocalciferol, (DRISDOL) 1.25 MG (50000 UNIT) CAPS capsule Take 1 capsule (50,000 Units total) by mouth every 7 (seven) days. 5 capsule 3   carvedilol (COREG) 12.5 MG tablet Take 1 tablet (12.5 mg total) by mouth 2 (two) times daily with a meal. 180 tablet 3   clopidogrel (PLAVIX) 75 MG tablet Take 1 tablet (75 mg total) by mouth daily. 90 tablet 3   docusate sodium (COLACE) 100 MG capsule Take 1 capsule (100 mg total) by mouth 2 (two) times daily as needed for mild constipation. 60 capsule 11   furosemide (LASIX) 20 MG tablet Take 1 tablet (20 mg total) by mouth 2 (two) times daily. 60 tablet 1   gabapentin (NEURONTIN) 300 MG capsule Take 1 capsule (300 mg total) by mouth 3 (three) times daily. 270 capsule 3   glimepiride (AMARYL) 4 MG tablet Take 1 tablet (4 mg total) by mouth daily. 90 tablet 3   ipratropium (ATROVENT)  0.03 % nasal spray USE 2 SPRAYS IN EACH NOSTRIL TWICE DAILY (Patient taking differently: Place 1 spray into both nostrils daily as needed (allergies).) 30 mL 2   metFORMIN (GLUCOPHAGE) 1000 MG tablet Take 1 tablet (1,000 mg total) by mouth 2 (two) times daily with a meal. 180 tablet 3   rosuvastatin (CRESTOR) 40 MG tablet Take 1 tablet (40 mg total) by mouth daily. 90 tablet 3   No facility-administered medications prior to visit.    No Known Allergies  ROS Review of Systems    Objective:    Physical Exam HENT:     Head: Normocephalic and atraumatic.     Nose: Nose normal.     Mouth/Throat:     Mouth: Mucous membranes are moist.  Cardiovascular:     Rate and Rhythm: Normal rate and regular rhythm.     Pulses: Normal pulses.     Heart sounds: Normal heart sounds.  Pulmonary:     Effort: Pulmonary effort is normal.     Breath sounds: Normal breath sounds.  Abdominal:     General: Bowel sounds are normal.     Palpations: Abdomen is soft.  Musculoskeletal:        General: Normal range of motion.     Cervical back: Normal range of motion.     Right lower leg: No edema.     Left lower leg: No edema.  Skin:    General: Skin is warm and dry.     Capillary Refill: Capillary refill takes less than 2 seconds.  Neurological:     General: No focal deficit present.     Mental Status: He is alert and oriented to person, place, and time.  Psychiatric:        Mood and Affect: Mood normal.        Behavior: Behavior normal.        Thought Content: Thought content normal.        Judgment: Judgment normal.    BP (!) 136/53   Pulse 91   Temp (!) 97.5 F (36.4 C)   Ht 6\' 3"  (1.905 m)   Wt 229 lb (103.9 kg)   SpO2 98%   BMI 28.62 kg/m  Wt Readings from Last 3 Encounters:  12/25/20 229 lb (103.9 kg)  12/21/20 228 lb 1.6 oz (103.5 kg)  11/29/20 230 lb 5 oz (104.5 kg)  Health Maintenance Due  Topic Date Due   Zoster Vaccines- Shingrix (1 of 2) Never done   FOOT EXAM   07/02/2017   COVID-19 Vaccine (2 - Pfizer risk series) 02/08/2020   PNA vac Low Risk Adult (2 of 2 - PPSV23) 03/21/2020    There are no preventive care reminders to display for this patient.  Lab Results  Component Value Date   TSH 1.026 12/21/2020   Lab Results  Component Value Date   WBC 6.1 12/21/2020   HGB 10.5 (L) 12/21/2020   HCT 32.3 (L) 12/21/2020   MCV 90.7 12/21/2020   PLT 225 12/21/2020   Lab Results  Component Value Date   NA 139 12/21/2020   K 4.5 12/21/2020   CO2 25 12/21/2020   GLUCOSE 224 (H) 12/21/2020   BUN 26 (H) 12/21/2020   CREATININE 1.94 (H) 12/21/2020   BILITOT 0.3 12/21/2020   ALKPHOS 70 12/21/2020   AST 17 12/21/2020   ALT 12 12/21/2020   PROT 8.1 12/21/2020   ALBUMIN 3.9 12/21/2020   CALCIUM 9.8 12/21/2020   ANIONGAP 8 12/21/2020   GFR 101.07 05/31/2019   Lab Results  Component Value Date   CHOL 117 06/28/2020   Lab Results  Component Value Date   HDL 35 (L) 06/28/2020   Lab Results  Component Value Date   LDLCALC 59 06/28/2020   Lab Results  Component Value Date   TRIG 132 06/28/2020   Lab Results  Component Value Date   CHOLHDL 3.3 06/28/2020   Lab Results  Component Value Date   HGBA1C 7.1 (A) 12/25/2020      Assessment & Plan:   Problem List Items Addressed This Visit       Cardiovascular and Mediastinum   Diabetes mellitus with peripheral vascular disease ([place]) Stable Encourage compliance with current treatment regimen  Encourage regular CBG monitoring Encourage contacting office if excessive hyperglycemia and or hypoglycemia Lifestyle modification with healthy diet (fewer calories, more high fiber foods, whole grains and non-starchy vegetables, lower fat meat and fish, low-fat diary include healthy oils) regular exercise (physical activity) and weight loss Opthalmology exam discussed  Nutritional consult recommended Regular dental visits encouraged Home BP monitoring also encouraged goal <130/80      Relevant Medications   carvedilol (COREG) 12.5 MG tablet   glimepiride (AMARYL) 4 MG tablet   metFORMIN (GLUCOPHAGE) 1000 MG tablet   rosuvastatin (CRESTOR) 40 MG tablet   CAD (coronary artery disease) Stable  Continue with current regimen.  No changes warranted. Good patient compliance.   Relevant Medications   carvedilol (COREG) 12.5 MG tablet   rosuvastatin (CRESTOR) 40 MG tablet     Endocrine   Diabetes ([facility]CC)   Relevant Medications   glimepiride (AMARYL) 4 MG tablet   metFORMIN (GLUCOPHAGE) 1000 MG tablet   rosuvastatin (CRESTOR) 40 MG tablet   Other Relevant Orders   Urinalysis Dipstick (Completed)   HgB A1c (Completed)   Other Visit Diagnoses     Essential hypertension    -  Primary Encouraged on going compliance with current medication regimen Encouraged home monitoring and recording BP <130/80 Eating a heart-healthy diet with less salt Encouraged regular physical activity     Relevant Medications   carvedilol (COREG) 12.5 MG tablet   rosuvastatin (CRESTOR) 40 MG tablet   Other Relevant Orders   Urinalysis Dipstick (Completed)   LVH (left ventricular hypertrophy)     Stable follow up with cardiology as scheduled   Relevant Medications   carvedilol (COREG) 12.5  MG tablet   rosuvastatin (CRESTOR) 40 MG tablet   Seasonal allergies       Constipation, unspecified constipation type     Persistent    Relevant Medications   docusate sodium (COLACE) 100 MG capsule   Anemia, unspecified type       Bilateral lower extremity edema       Chemotherapy-induced neuropathy ([facility])     Stable   Relevant Medications   gabapentin (NEURONTIN) 300 MG capsule      Screening for prostate cancer     CT scan complete PSA not covered under insurance       Meds ordered this encounter  Medications   carvedilol (COREG) 12.5 MG tablet    Sig: Take 1 tablet (12.5 mg total) by mouth 2 (two) times daily with a meal.    Dispense:  180 tablet    Refill:  3    Order Specific  Question:   Supervising Provider    Answer:   Tresa Garter [[id number]]   clopidogrel (PLAVIX) 75 MG tablet    Sig: Take 1 tablet (75 mg total) by mouth daily.    Dispense:  90 tablet    Refill:  3    Order Specific Question:   Supervising Provider    Answer:   Tresa Garter [[id number]]   docusate sodium (COLACE) 100 MG capsule    Sig: Take 1 capsule (100 mg total) by mouth 2 (two) times daily as needed for mild constipation.    Dispense:  180 capsule    Refill:  3    Order Specific Question:   Supervising Provider    Answer:   Tresa Garter [[id number]]   gabapentin (NEURONTIN) 300 MG capsule    Sig: Take 1 capsule (300 mg total) by mouth 3 (three) times daily.    Dispense:  270 capsule    Refill:  3    Order Specific Question:   Supervising Provider    Answer:   Tresa Garter [[id number]]   glimepiride (AMARYL) 4 MG tablet    Sig: Take 1 tablet (4 mg total) by mouth daily.    Dispense:  90 tablet    Refill:  3    Order Specific Question:   Supervising Provider    Answer:   Tresa Garter [[id number]]   metFORMIN (GLUCOPHAGE) 1000 MG tablet    Sig: Take 1 tablet (1,000 mg total) by mouth 2 (two) times daily with a meal.    Dispense:  180 tablet    Refill:  3    Order Specific Question:   Supervising Provider    Answer:   Tresa Garter [[id number]]   rosuvastatin (CRESTOR) 40 MG tablet    Sig: Take 1 tablet (40 mg total) by mouth daily.    Dispense:  90 tablet    Refill:  3    Order Specific Question:   Supervising Provider    Answer:   Tresa Garter [id number]    Follow-up: Return in about 3 months (around 03/27/2021) for follow up DM and HTN [id number].    [name], NP

## 2020-12-25 NOTE — Patient Instructions (Signed)
Diabetes Mellitus and Nutrition, Adult When you have diabetes, or diabetes mellitus, it is very important to have healthy eating habits because your blood sugar (glucose) levels are greatly affected by what you eat and drink. Eating healthy foods in the right amounts, at about the same times every day, can help you: Control your blood glucose. Lower your risk of heart disease. Improve your blood pressure. Reach or maintain a healthy weight. What can affect my meal plan? Every person with diabetes is different, and each person has different needs for a meal plan. Your health care provider may recommend that you work with a dietitian to make a meal plan that is best for you. Your meal plan may vary depending on factors such as: The calories you need. The medicines you take. Your weight. Your blood glucose, blood pressure, and cholesterol levels. Your activity level. Other health conditions you have, such as heart or kidney disease. How do carbohydrates affect me? Carbohydrates, also called carbs, affect your blood glucose level more than any other type of food. Eating carbs naturally raises the amount of glucose in your blood. Carb counting is a method for keeping track of how many carbs you eat. Counting carbs is important to keep your blood glucose at a healthy level,especially if you use insulin or take certain oral diabetes medicines. It is important to know how many carbs you can safely have in each meal. This is different for every person. Your dietitian can help you calculate how manycarbs you should have at each meal and for each snack. How does alcohol affect me? Alcohol can cause a sudden decrease in blood glucose (hypoglycemia), especially if you use insulin or take certain oral diabetes medicines. Hypoglycemia can be a life-threatening condition. Symptoms of hypoglycemia, such as sleepiness, dizziness, and confusion, are similar to symptoms of having too much alcohol. Do not drink  alcohol if: Your health care provider tells you not to drink. You are pregnant, may be pregnant, or are planning to become pregnant. If you drink alcohol: Do not drink on an empty stomach. Limit how much you use to: 0-1 drink a day for women. 0-2 drinks a day for men. Be aware of how much alcohol is in your drink. In the [place] one drink equals one 12 oz bottle of beer (355 mL), one 5 oz glass of wine (148 mL), or one 1 oz glass of hard liquor (44 mL). Keep yourself hydrated with water, diet soda, or unsweetened iced tea. Keep in mind that regular soda, juice, and other mixers may contain a lot of sugar and must be counted as carbs. What are tips for following this plan?  Reading food labels Start by checking the serving size on the "Nutrition Facts" label of packaged foods and drinks. The amount of calories, carbs, fats, and other nutrients listed on the label is based on one serving of the item. Many items contain more than one serving per package. Check the total grams (g) of carbs in one serving. You can calculate the number of servings of carbs in one serving by dividing the total carbs by 15. For example, if a food has 30 g of total carbs per serving, it would be equal to 2 servings of carbs. Check the number of grams (g) of saturated fats and trans fats in one serving. Choose foods that have a low amount or none of these fats. Check the number of milligrams (mg) of salt (sodium) in one serving. Most people should limit total  sodium intake to less than 2,300 mg per day. Always check the nutrition information of foods labeled as "low-fat" or "nonfat." These foods may be higher in added sugar or refined carbs and should be avoided. Talk to your dietitian to identify your daily goals for nutrients listed on the label. Shopping Avoid buying canned, pre-made, or processed foods. These foods tend to be high in fat, sodium, and added sugar. Shop around the outside edge of the grocery store. This  is where you will most often find fresh fruits and vegetables, bulk grains, fresh meats, and fresh dairy. Cooking Use low-heat cooking methods, such as baking, instead of high-heat cooking methods like deep frying. Cook using healthy oils, such as olive, canola, or sunflower oil. Avoid cooking with butter, cream, or high-fat meats. Meal planning Eat meals and snacks regularly, preferably at the same times every day. Avoid going long periods of time without eating. Eat foods that are high in fiber, such as fresh fruits, vegetables, beans, and whole grains. Talk with your dietitian about how many servings of carbs you can eat at each meal. Eat 4-6 oz (112-168 g) of lean protein each day, such as lean meat, chicken, fish, eggs, or tofu. One ounce (oz) of lean protein is equal to: 1 oz (28 g) of meat, chicken, or fish. 1 egg.  cup (62 g) of tofu. Eat some foods each day that contain healthy fats, such as avocado, nuts, seeds, and fish. What foods should I eat? Fruits Berries. Apples. Oranges. Peaches. Apricots. Plums. Grapes. Mango. Papaya.Pomegranate. Kiwi. Cherries. Vegetables Lettuce. Spinach. Leafy greens, including kale, chard, collard greens, and mustard greens. Beets. Cauliflower. Cabbage. Broccoli. Carrots. Green beans.Tomatoes. Peppers. Onions. Cucumbers. Brussels sprouts. Grains Whole grains, such as whole-wheat or whole-grain bread, crackers, tortillas,cereal, and pasta. Unsweetened oatmeal. Quinoa. Brown or wild rice. Meats and other proteins Seafood. Poultry without skin. Lean cuts of poultry and beef. Tofu. Nuts. Seeds. Dairy Low-fat or fat-free dairy products such as milk, yogurt, and cheese. The items listed above may not be a complete list of foods and beverages you can eat. Contact a dietitian for more information. What foods should I avoid? Fruits Fruits canned with syrup. Vegetables Canned vegetables. Frozen vegetables with butter or cream sauce. Grains Refined white  flour and flour products such as bread, pasta, snack foods, andcereals. Avoid all processed foods. Meats and other proteins Fatty cuts of meat. Poultry with skin. Breaded or fried meats. Processed meat.Avoid saturated fats. Dairy Full-fat yogurt, cheese, or milk. Beverages Sweetened drinks, such as soda or iced tea. The items listed above may not be a complete list of foods and beverages you should avoid. Contact a dietitian for more information. Questions to ask a health care provider Do I need to meet with a diabetes educator? Do I need to meet with a dietitian? What number can I call if I have questions? When are the best times to check my blood glucose? Where to find more information: American Diabetes Association: diabetes.org Academy of Nutrition and Dietetics: www.eatright.[employer] of Diabetes and Digestive and Kidney Diseases: [employer] Association of Diabetes Care and Education Specialists: www.diabeteseducator.org Summary It is important to have healthy eating habits because your blood sugar (glucose) levels are greatly affected by what you eat and drink. A healthy meal plan will help you control your blood glucose and maintain a healthy lifestyle. Your health care provider may recommend that you work with a dietitian to make a meal plan that is best for you. Keep in  mind that carbohydrates (carbs) and alcohol have immediate effects on your blood glucose levels. It is important to count carbs and to use alcohol carefully. This information is not intended to replace advice given to you by your health care provider. Make sure you discuss any questions you have with your healthcare provider. Document Revised: 04/20/2019 Document Reviewed: 04/20/2019 Elsevier Patient Education  2021 [place]. Hypertension, Adult Hypertension is another name for high blood pressure. High blood pressure forces your heart to work harder to pump blood. This can cause problems  overtime. There are two numbers in a blood pressure reading. There is a top number (systolic) over a bottom number (diastolic). It is best to have a blood pressure that is below 120/80. Healthy choicescan help lower your blood pressure, or you may need medicine to help lower it. What are the causes? The cause of this condition is not known. Some conditions may be related tohigh blood pressure. What increases the risk? Smoking. Having type 2 diabetes mellitus, high cholesterol, or both. Not getting enough exercise or physical activity. Being overweight. Having too much fat, sugar, calories, or salt (sodium) in your diet. Drinking too much alcohol. Having long-term (chronic) kidney disease. Having a family history of high blood pressure. Age. Risk increases with age. Race. You may be at higher risk if you are African American. Gender. Men are at higher risk than women before age 103. After age 29, women are at higher risk than men. Having obstructive sleep apnea. Stress. What are the signs or symptoms? High blood pressure may not cause symptoms. Very high blood pressure (hypertensive crisis) may cause: Headache. Feelings of worry or nervousness (anxiety). Shortness of breath. Nosebleed. A feeling of being sick to your stomach (nausea). Throwing up (vomiting). Changes in how you see. Very bad chest pain. Seizures. How is this treated? This condition is treated by making healthy lifestyle changes, such as: Eating healthy foods. Exercising more. Drinking less alcohol. Your health care provider may prescribe medicine if lifestyle changes are not enough to get your blood pressure under control, and if: Your top number is above 130. Your bottom number is above 80. Your personal target blood pressure may vary. Follow these instructions at home: Eating and drinking  If told, follow the DASH eating plan. To follow this plan: Fill one half of your plate at each meal with fruits and  vegetables. Fill one fourth of your plate at each meal with whole grains. Whole grains include whole-wheat pasta, brown rice, and whole-grain bread. Eat or drink low-fat dairy products, such as skim milk or low-fat yogurt. Fill one fourth of your plate at each meal with low-fat (lean) proteins. Low-fat proteins include fish, chicken without skin, eggs, beans, and tofu. Avoid fatty meat, cured and processed meat, or chicken with skin. Avoid pre-made or processed food. Eat less than 1,500 mg of salt each day. Do not drink alcohol if: Your doctor tells you not to drink. You are pregnant, may be pregnant, or are planning to become pregnant. If you drink alcohol: Limit how much you use to: 0-1 drink a day for women. 0-2 drinks a day for men. Be aware of how much alcohol is in your drink. In the [place] one drink equals one 12 oz bottle of beer (355 mL), one 5 oz glass of wine (148 mL), or one 1 oz glass of hard liquor (44 mL).  Lifestyle  Work with your doctor to stay at a healthy weight or to lose weight. Ask your  doctor what the best weight is for you. Get at least 30 minutes of exercise most days of the week. This may include walking, swimming, or biking. Get at least 30 minutes of exercise that strengthens your muscles (resistance exercise) at least 3 days a week. This may include lifting weights or doing Pilates. Do not use any products that contain nicotine or tobacco, such as cigarettes, e-cigarettes, and chewing tobacco. If you need help quitting, ask your doctor. Check your blood pressure at home as told by your doctor. Keep all follow-up visits as told by your doctor. This is important.  Medicines Take over-the-counter and prescription medicines only as told by your doctor. Follow directions carefully. Do not skip doses of blood pressure medicine. The medicine does not work as well if you skip doses. Skipping doses also puts you at risk for problems. Ask your doctor about side effects  or reactions to medicines that you should watch for. Contact a doctor if you: Think you are having a reaction to the medicine you are taking. Have headaches that keep coming back (recurring). Feel dizzy. Have swelling in your ankles. Have trouble with your vision. Get help right away if you: Get a very bad headache. Start to feel mixed up (confused). Feel weak or numb. Feel faint. Have very bad pain in your: Chest. Belly (abdomen). Throw up more than once. Have trouble breathing. Summary Hypertension is another name for high blood pressure. High blood pressure forces your heart to work harder to pump blood. For most people, a normal blood pressure is less than 120/80. Making healthy choices can help lower blood pressure. If your blood pressure does not get lower with healthy choices, you may need to take medicine. This information is not intended to replace advice given to you by your health care provider. Make sure you discuss any questions you have with your healthcare provider. Document Revised: 01/21/2018 Document Reviewed: 01/21/2018 Elsevier Patient Education  [place].

## 2020-12-26 DIAGNOSIS — D702 Other drug-induced agranulocytosis: Secondary | ICD-10-CM | POA: Insufficient documentation

## 2021-01-08 ENCOUNTER — Ambulatory Visit (HOSPITAL_BASED_OUTPATIENT_CLINIC_OR_DEPARTMENT_OTHER): Payer: Medicare Other

## 2021-01-08 ENCOUNTER — Encounter: Payer: Self-pay | Admitting: Nurse Practitioner

## 2021-01-08 ENCOUNTER — Other Ambulatory Visit: Payer: Self-pay | Admitting: Medical Oncology

## 2021-01-08 ENCOUNTER — Ambulatory Visit (HOSPITAL_BASED_OUTPATIENT_CLINIC_OR_DEPARTMENT_OTHER): Admission: RE | Admit: 2021-01-08 | Payer: Medicare Other | Source: Ambulatory Visit

## 2021-01-08 ENCOUNTER — Encounter (HOSPITAL_COMMUNITY): Payer: Self-pay

## 2021-01-08 ENCOUNTER — Other Ambulatory Visit: Payer: Self-pay

## 2021-01-08 ENCOUNTER — Ambulatory Visit (INDEPENDENT_AMBULATORY_CARE_PROVIDER_SITE_OTHER): Payer: Medicare Other | Admitting: Nurse Practitioner

## 2021-01-08 ENCOUNTER — Emergency Department (HOSPITAL_COMMUNITY)
Admission: EM | Admit: 2021-01-08 | Discharge: 2021-01-08 | Disposition: A | Discharge status: Home / Self Care | Payer: Medicare Other | Attending: Emergency Medicine | Admitting: Emergency Medicine

## 2021-01-08 ENCOUNTER — Telehealth: Payer: Self-pay | Admitting: Medical Oncology

## 2021-01-08 ENCOUNTER — Encounter (HOSPITAL_BASED_OUTPATIENT_CLINIC_OR_DEPARTMENT_OTHER): Payer: Self-pay

## 2021-01-08 ENCOUNTER — Ambulatory Visit (HOSPITAL_BASED_OUTPATIENT_CLINIC_OR_DEPARTMENT_OTHER)
Admission: RE | Admit: 2021-01-08 | Discharge: 2021-01-08 | Disposition: A | Discharge status: Home / Self Care | Payer: Medicare Other | Source: Ambulatory Visit | Attending: Internal Medicine | Admitting: Internal Medicine

## 2021-01-08 ENCOUNTER — Other Ambulatory Visit: Payer: Self-pay | Admitting: Internal Medicine

## 2021-01-08 VITALS — BP 124/54 | HR 80 | Temp 97.7°F | Ht 75.0 in | Wt 224.0 lb

## 2021-01-08 DIAGNOSIS — T17208A Unspecified foreign body in pharynx causing other injury, initial encounter: Secondary | ICD-10-CM | POA: Diagnosis present

## 2021-01-08 DIAGNOSIS — C787 Secondary malignant neoplasm of liver and intrahepatic bile duct: Secondary | ICD-10-CM | POA: Diagnosis not present

## 2021-01-08 DIAGNOSIS — X58XXXA Exposure to other specified factors, initial encounter: Secondary | ICD-10-CM | POA: Insufficient documentation

## 2021-01-08 DIAGNOSIS — R130 Aphagia: Secondary | ICD-10-CM

## 2021-01-08 DIAGNOSIS — C3490 Malignant neoplasm of unspecified part of unspecified bronchus or lung: Secondary | ICD-10-CM | POA: Insufficient documentation

## 2021-01-08 DIAGNOSIS — R499 Unspecified voice and resonance disorder: Secondary | ICD-10-CM | POA: Insufficient documentation

## 2021-01-08 DIAGNOSIS — Z85118 Personal history of other malignant neoplasm of bronchus and lung: Secondary | ICD-10-CM | POA: Insufficient documentation

## 2021-01-08 DIAGNOSIS — E1151 Type 2 diabetes mellitus with diabetic peripheral angiopathy without gangrene: Secondary | ICD-10-CM

## 2021-01-08 DIAGNOSIS — I10 Essential (primary) hypertension: Secondary | ICD-10-CM | POA: Diagnosis not present

## 2021-01-08 DIAGNOSIS — I2510 Atherosclerotic heart disease of native coronary artery without angina pectoris: Secondary | ICD-10-CM

## 2021-01-08 DIAGNOSIS — Z5321 Procedure and treatment not carried out due to patient leaving prior to being seen by health care provider: Secondary | ICD-10-CM | POA: Insufficient documentation

## 2021-01-08 NOTE — Telephone Encounter (Signed)
Imaging scheduled for today at [facility].  Pt aware.   I called radiology and told Jenny Reichmann pt is unable to drink barium due to his difficulty swallowing.

## 2021-01-08 NOTE — Progress Notes (Signed)
I spoke with Kirk [name].  Can you please keep an eye out to see if oncology contacted him with further treatment options/plan. He reports feeling better now but will go to the ED if needed.  Thanks

## 2021-01-08 NOTE — Telephone Encounter (Signed)
"  Problem swallowing/feels like his throat is closing up.voice raspy "-   Yesterday he felt like he had something hung up in his throat and it was closing up.   He went to ED at 2 am ,but he left after prolonged wait in ED.   He could not swallow benadryl capsules. He also had trouble with liquids.  He said when he lays down his breathing is worse.  He said he did eat some chicken today that eventually went down.  This symptom "is the same as when I  was first diagnosed with cancer".  I told him to not lay down ,but to sit up and go to ED if breathing is compromised. He has appt today with NP.  Per Dr [name] , he will see him Thursday and to go to ED before if breathing compromised , pain in the area, worsening symptoms.

## 2021-01-08 NOTE — Progress Notes (Signed)
Good evening Dr [name]  I spoke with Arabella Merles concerning the results of his CT neck.  He reports that he is feeling better currently.  I encouraged him if his symptoms progress that he should proceed to the emergency department for further evaluation.  I also called the emergency department and made them aware of his current CT results and history.  I am out of the office on tomorrow.  I did inform Mr. Hingle that someone from your office would be calling him with results of his CT scan and any additional treatment changes.  Thank you for your time Dionisio David NP

## 2021-01-08 NOTE — Progress Notes (Addendum)
Kirk [name], [place]  [id number] Phone:  [phone]   Fax:  [phone]   Established Patient Office Visit  Subjective:  Patient ID: Kirk [name], male    DOB: 1955/12/15  Age: 65 y.o. MRN: [id number]  CC:  Chief Complaint  Patient presents with   Acute Visit    Trouble swallowing, Started yesterday, feels like he has something stuck. Worse when lying down      HPI Kirk [name] presents for an evaluation. He  has a past medical history of Arthritis, Atrial flutter with rapid ventricular response ([place]) (04/19/2019), Dizziness (10/2019), Dyspnea, Elevated serum creatinine (07/2020), HTN (hypertension), Hyperlipidemia, Lung cancer ([place]) (05/2019), Microalbuminuria (07/2020), NSTEMI (non-ST elevated myocardial infarction) ([place]) (09/04/2015), PAD (peripheral artery disease) ([place]), Refusal of blood transfusions as patient is Jehovah's Witness, Type II diabetes mellitus ([place]), and Vitamin D deficiency (07/2020).   Kirk [name] presented to the ED on last evening however did not stay for treatment due to the extended wait time. Patient presents with dysphagia. The patient describes multiple episode(s) with sudden onset, located in the lower throat, and symptoms lasting a few minutes. Patient reports pills have become stuck. He was also having difficulty lying down. Associated with the dysphagia, patient also is experiencing hoarseness. Patient denies post nasal drainage, hemoptysis, heartburn, and aspiration.  Patient that these were the same symptoms that he experienced when he was initially diagnosed with lung cancer. He also reports that he spoke with the oncologist office and they recommended that he be seen here. Denies headache, dizziness, visual changes, shortness of breath, dyspnea on exertion, chest pain, nausea, vomiting or any edema.   Past Medical History:  Diagnosis Date   Arthritis    "right knee" (09/04/2015)   Atrial flutter with  rapid ventricular response ([place]) 04/19/2019   Dizziness 10/2019   Dyspnea    increased exertion   Elevated serum creatinine 07/2020   HTN (hypertension)    Hyperlipidemia    Lung cancer ([place]) 05/2019   Microalbuminuria 07/2020   NSTEMI (non-ST elevated myocardial infarction) ([place]) 09/04/2015   a. cath 09/05/2015: 95% stenosis mid-LAD, 55% mid-RCA, and 40% prox-RCA. PCI performed w/ Synergy DES to LAD   PAD (peripheral artery disease) ([facility])    Stenting of bilateral iliacs in 2003.   Refusal of blood transfusions as patient is Jehovah's Witness    Type II diabetes mellitus ([place])    Vitamin D deficiency 07/2020    Past Surgical History:  Procedure Laterality Date   CARDIAC CATHETERIZATION N/A 09/05/2015   Procedure: Left Heart Cath and Coronary Angiography;  Surgeon: Troy Sine, MD;  Location: [facility];  Service: Cardiovascular;  Laterality: N/A;   CARDIAC CATHETERIZATION N/A 09/05/2015   Procedure: Coronary Stent Intervention;  Surgeon: Troy Sine, MD;  Location: [facility];  Service: Cardiovascular;  Laterality: N/A;   IR IMAGING GUIDED PORT INSERTION  07/15/2019   ORIF CONGENITAL HIP DISLOCATION Left ~ 1965   "put pins in it"   PERIPHERAL VASCULAR CATHETERIZATION Bilateral 2003   "stenting"   TONSILLECTOMY  ~ 1963    Family History  Problem Relation Age of Onset   Lymphoma Mother    Cancer - Prostate Father    CAD Neg Hx    Colon cancer Neg Hx    Rectal cancer Neg Hx    Stomach cancer Neg Hx    Esophageal cancer Neg Hx     Social History  Socioeconomic History   Marital status: Married    Spouse name: Not on file   Number of children: 1   Years of education: Not on file   Highest education level: Not on file  Occupational History   Occupation: Furniture salesman  Tobacco Use   Smoking status: Former    Packs/day: 2.00    Years: 35.00    Pack years: 70.00    Types: Cigarettes    Quit date: 02/19/2003    Years since quitting: 17.8    Smokeless tobacco: Never  Vaping Use   Vaping Use: Never used  Substance and Sexual Activity   Alcohol use: Not Currently    Alcohol/week: 0.0 standard drinks   Drug use: Not Currently    Types: Marijuana   Sexual activity: Not Currently  Other Topics Concern   Not on file  Social History Narrative   Not on file   Social Determinants of Health   Financial Resource Strain: Not on file  Food Insecurity: Not on file  Transportation Needs: Not on file  Physical Activity: Not on file  Stress: Not on file  Social Connections: Not on file  Intimate Partner Violence: Not on file    Outpatient Medications Prior to Visit  Medication Sig Dispense Refill   acetaminophen (TYLENOL) 500 MG tablet Take 500 mg by mouth every 6 (six) hours as needed for moderate pain or fever.      aspirin EC 81 MG tablet Take 81 mg by mouth at bedtime.     carvedilol (COREG) 12.5 MG tablet Take 1 tablet (12.5 mg total) by mouth 2 (two) times daily with a meal. 180 tablet 3   cetirizine (ZYRTEC) 10 MG tablet Take 1 tablet (10 mg total) by mouth daily. 90 tablet 3   clopidogrel (PLAVIX) 75 MG tablet Take 1 tablet (75 mg total) by mouth daily. 90 tablet 3   docusate sodium (COLACE) 100 MG capsule Take 1 capsule (100 mg total) by mouth 2 (two) times daily as needed for mild constipation. 180 capsule 3   ferrous sulfate 324 MG TBEC Take 1 tablet (324 mg total) by mouth daily with breakfast. 30 tablet 11   folic acid (FOLVITE) 1 MG tablet Take 1 tablet (1 mg total) by mouth daily. 90 tablet 3   gabapentin (NEURONTIN) 300 MG capsule Take 1 capsule (300 mg total) by mouth 3 (three) times daily. 270 capsule 3   glimepiride (AMARYL) 4 MG tablet Take 1 tablet (4 mg total) by mouth daily. 90 tablet 3   Hydrocortisone Acetate 1 % CREA Apply 15 g topically 2 (two) times daily. 15 g 3   lactulose (CHRONULAC) 10 GM/15ML solution Take 30 mLs (20 g total) by mouth 2 (two) times daily as needed for moderate constipation. 240 mL 11    lidocaine-prilocaine (EMLA) cream APPLY TO THE PORT-A-CATH SITE 30-60 MINUTES BEFORE TREATMENT. (Patient taking differently: Apply 1 application topically daily as needed (access port). Apply to the Port-A-Cath site 30-60 minutes before treatment.) 30 g 0   lisinopril (ZESTRIL) 5 MG tablet Take 5 mg by mouth daily.     meclizine (ANTIVERT) 25 MG tablet Take 1 tablet (25 mg total) by mouth 3 (three) times daily as needed for dizziness. 90 tablet 3   metFORMIN (GLUCOPHAGE) 1000 MG tablet Take 1 tablet (1,000 mg total) by mouth 2 (two) times daily with a meal. 180 tablet 3   multivitamin (ONE-A-DAY MEN'S) TABS tablet Take 1 tablet by mouth daily. 30 tablet 6  nitroGLYCERIN (NITROSTAT) 0.4 MG SL tablet Place 1 tablet (0.4 mg total) under the tongue every 5 (five) minutes x 3 doses as needed for chest pain. 25 tablet 2   oxyCODONE-acetaminophen (PERCOCET/ROXICET) 5-325 MG tablet Take 1 tablet by mouth every 6 (six) hours as needed for severe pain. 20 tablet 0   pantoprazole (PROTONIX) 40 MG tablet Take 1 tablet (40 mg total) by mouth daily. 90 tablet 3   prochlorperazine (COMPAZINE) 10 MG tablet Take 1 tablet (10 mg total) by mouth every 6 (six) hours as needed for nausea or vomiting. 30 tablet 11   rosuvastatin (CRESTOR) 40 MG tablet Take 1 tablet (40 mg total) by mouth daily. 90 tablet 3   Vitamin D, Ergocalciferol, (DRISDOL) 1.25 MG (50000 UNIT) CAPS capsule Take 1 capsule (50,000 Units total) by mouth every 7 (seven) days. 5 capsule 3   No facility-administered medications prior to visit.    No Known Allergies  ROS Review of Systems    Objective:    Physical Exam Constitutional:      General: He is not in acute distress.    Appearance: He is not ill-appearing, toxic-appearing or diaphoretic.  HENT:     Head: Normocephalic and atraumatic.     Nose: Nose normal.     Mouth/Throat:     Mouth: Mucous membranes are moist.     Pharynx: Oropharynx is clear.  Cardiovascular:     Rate and  Rhythm: Normal rate and regular rhythm.     Pulses: Normal pulses.     Heart sounds: Normal heart sounds.  Pulmonary:     Effort: Pulmonary effort is normal.     Breath sounds: Normal breath sounds.  Abdominal:     Palpations: Abdomen is soft.  Musculoskeletal:        General: Normal range of motion.     Cervical back: Normal range of motion. No tenderness.  Lymphadenopathy:     Cervical: No cervical adenopathy.  Skin:    General: Skin is warm and dry.     Capillary Refill: Capillary refill takes less than 2 seconds.  Neurological:     General: No focal deficit present.     Mental Status: He is alert and oriented to person, place, and time.  Psychiatric:        Mood and Affect: Mood normal.        Behavior: Behavior normal.        Thought Content: Thought content normal.        Judgment: Judgment normal.    BP (!) 124/54 (BP Location: Right Arm, Patient Position: Sitting)   Pulse 80   Temp 97.7 F (36.5 C)   Ht 6\' 3"  (1.905 m)   Wt 224 lb 0.2 oz (101.6 kg)   SpO2 100%   BMI 28.00 kg/m  Wt Readings from Last 3 Encounters:  01/08/21 224 lb 0.2 oz (101.6 kg)  01/08/21 230 lb (104.3 kg)  12/25/20 229 lb (103.9 kg)     Health Maintenance Due  Topic Date Due   Zoster Vaccines- Shingrix (1 of 2) Never done   FOOT EXAM  07/02/2017   COVID-19 Vaccine (2 - Pfizer risk series) 02/08/2020   PNA vac Low Risk Adult (2 of 2 - PPSV23) 03/21/2020    There are no preventive care reminders to display for this patient.  Lab Results  Component Value Date   TSH 1.026 12/21/2020   Lab Results  Component Value Date   WBC 6.1 12/21/2020   HGB  10.5 (L) 12/21/2020   HCT 32.3 (L) 12/21/2020   MCV 90.7 12/21/2020   PLT 225 12/21/2020   Lab Results  Component Value Date   NA 139 12/21/2020   K 4.5 12/21/2020   CO2 25 12/21/2020   GLUCOSE 224 (H) 12/21/2020   BUN 26 (H) 12/21/2020   CREATININE 1.94 (H) 12/21/2020   BILITOT 0.3 12/21/2020   ALKPHOS 70 12/21/2020   AST 17  12/21/2020   ALT 12 12/21/2020   PROT 8.1 12/21/2020   ALBUMIN 3.9 12/21/2020   CALCIUM 9.8 12/21/2020   ANIONGAP 8 12/21/2020   GFR 101.07 05/31/2019   Lab Results  Component Value Date   CHOL 117 06/28/2020   Lab Results  Component Value Date   HDL 35 (L) 06/28/2020   Lab Results  Component Value Date   LDLCALC 59 06/28/2020   Lab Results  Component Value Date   TRIG 132 06/28/2020   Lab Results  Component Value Date   CHOLHDL 3.3 06/28/2020   Lab Results  Component Value Date   HGBA1C 7.1 (A) 12/25/2020      Assessment & Plan:   Problem List Items Addressed This Visit       Cardiovascular and Mediastinum   HTN (hypertension) Stable Encouraged on going compliance with current medication regimen Encouraged home monitoring and recording BP <130/80 Eating a heart-healthy diet with less salt Encouraged regular physical activity     Diabetes mellitus with peripheral vascular disease ([place]) Controlled Continue with current regimen.  No changes warranted. Good patient compliance.    CAD (coronary artery disease) Stable continue with current regimen     Respiratory   Metastatic non-small cell lung cancer ([place]) Persistent continue to follow up with oncology with current treatment plan     Digestive   Liver metastasis ([place])   Other Visit Diagnoses     Inability to swallow    -  Primary CT scan appointment set for 1630 Patient instructed not to eat or drink   Relevant Orders   CT SOFT TISSUE NECK WO CONTRAST        No orders of the defined types were placed in this encounter.   Follow-up: Return for Appointment As Scheduled.    [name], NP

## 2021-01-08 NOTE — Patient Instructions (Signed)
Bradley's Neurology in [place] (8th ed., pp. [phone]). [place], [place]: Elsevier."> Marya Amsler of Surgery (21st ed., pp. [phone]). [place], [place]: [employer].">  Dysphagia  Dysphagia is trouble swallowing. This condition occurs when solids and liquids stick in a person's throat on the way down to the stomach, or when food takeslonger to get to the stomach than usual. You may have problems swallowing food, liquids, or both. You may also have pain while trying to swallow. It may take you more time and effort to swallowsomething. What are the causes? This condition may be caused by: Muscle problems. These may make it difficult for you to move food and liquids through the esophagus, which is the tube that connects your mouth to your stomach. Blockages. You may have ulcers, scar tissue, or inflammation that blocks the normal passage of food and liquids. Causes of these problems include: Acid reflux from your stomach into your esophagus (gastroesophageal reflux). Infections. Radiation treatment for cancer. Medicines taken without enough fluids to wash them down into your stomach. Stroke. This can affect the nerves and make it difficult to swallow. Nerve problems. These prevent signals from being sent to the muscles of your esophagus to squeeze (contract) and move what you swallow down to your stomach. Globus pharyngeus. This is a common problem that involves a feeling like something is stuck in your throat or a sense of trouble with swallowing, even though nothing is wrong with the swallowing passages. Certain conditions, such as cerebral palsy or Parkinson's disease. What are the signs or symptoms? Common symptoms of this condition include: A feeling that solids or liquids are stuck in your throat on the way down to the stomach. Pain while swallowing. Coughing or gagging while trying to swallow. Other symptoms include: Food moving back from your stomach to your mouth  (regurgitation). Noises coming from your throat. Chest discomfort when swallowing. A feeling of fullness when swallowing. Drooling, especially when the throat is blocked. Heartburn. How is this diagnosed? This condition may be diagnosed by: Barium swallow X-ray. In this test, you will swallow a white liquid that sticks to the inside of your esophagus. X-ray images are then taken. Endoscopy. In this test, a flexible telescope is inserted down your throat to look at your esophagus and your stomach. CT scans or an MRI. How is this treated? Treatment for dysphagia depends on the cause of this condition: If the dysphagia is caused by acid reflux or infection, medicines may be used. These may include antibiotics or heartburn medicines. If the dysphagia is caused by problems with the muscles, swallowing therapy may be used to help you strengthen your swallowing muscles. You may have to do specific exercises to strengthen the muscles or stretch them. If the dysphagia is caused by a blockage or mass, procedures to remove the blockage may be done. You may need surgery and a feeding tube. You may need to make diet changes. Ask your health care provider for specificinstructions. Follow these instructions at home: Medicines Take over-the-counter and prescription medicines only as told by your health care provider. If you were prescribed an antibiotic medicine, take it as told by your health care provider. Do not stop taking the antibiotic even if you start to feel better. Eating and drinking  Make any diet changes as told by your health care provider. Work with a diet and nutrition specialist (dietitian) to create an eating plan that will help you get the nutrients you need in order to stay healthy. Eat soft foods that  are easier to swallow. Cut your food into small pieces and eat slowly. Take small bites. Eat and drink only when you are sitting upright. Do not drink alcohol or caffeine. If you need  help quitting, ask your health care provider.  General instructions Check your weight every day to make sure you are not losing weight. Do not use any products that contain nicotine or tobacco. These products include cigarettes, chewing tobacco, and vaping devices, such as e-cigarettes. If you need help quitting, ask your health care provider. Keep all follow-up visits. This is important. Contact a health care provider if: You lose weight because you cannot swallow. You cough when you drink liquids. You cough up partially digested food. Get help right away if: You cannot swallow your saliva. You have shortness of breath, a fever, or both. Your voice is hoarse and you have trouble swallowing. These symptoms may represent a serious problem that is an emergency. Do not wait to see if the symptoms will go away. Get medical help right away. Call your local emergency services (911 in the [place] Do not drive yourself to the hospital. Summary Dysphagia is trouble swallowing. This condition occurs when solids and liquids stick in a person's throat on the way down to the stomach. You may cough or gag while trying to swallow. Dysphagia has many possible causes. Treatment for dysphagia depends on the cause of the condition. Keep all follow-up visits. This is important. This information is not intended to replace advice given to you by your health care provider. Make sure you discuss any questions you have with your healthcare provider. Document Revised: 01/01/2020 Document Reviewed: 01/01/2020 Elsevier Patient Education  [place].

## 2021-01-08 NOTE — ED Triage Notes (Signed)
Pt reports having a foreign body in his throat making it hard for him to swallow x 1 day. Pt reports that it is not food, that it just feels like a lump in his throat. Pt reports having lung cancer and actively undergoing treatments.

## 2021-01-09 ENCOUNTER — Telehealth: Payer: Self-pay

## 2021-01-09 NOTE — Telephone Encounter (Signed)
Pt Kirk [name] wanting to know about his CT scan from 01/08/21.   Discussed with Dr. [name] who advised pt may need a referral to ENT for further assessment.  I have spoken with the pt as well and advised as indicated. Pt expressed understanding of this information.

## 2021-01-11 ENCOUNTER — Other Ambulatory Visit: Payer: Self-pay

## 2021-01-11 ENCOUNTER — Encounter: Payer: Self-pay | Admitting: Internal Medicine

## 2021-01-11 ENCOUNTER — Inpatient Hospital Stay: Payer: Medicare Other

## 2021-01-11 ENCOUNTER — Telehealth: Payer: Self-pay | Admitting: Internal Medicine

## 2021-01-11 ENCOUNTER — Inpatient Hospital Stay: Payer: Medicare Other | Attending: Internal Medicine | Admitting: Internal Medicine

## 2021-01-11 VITALS — BP 124/50 | HR 96 | Temp 97.4°F | Resp 20 | Ht 75.0 in | Wt 226.1 lb

## 2021-01-11 DIAGNOSIS — Z79899 Other long term (current) drug therapy: Secondary | ICD-10-CM | POA: Diagnosis not present

## 2021-01-11 DIAGNOSIS — C3490 Malignant neoplasm of unspecified part of unspecified bronchus or lung: Secondary | ICD-10-CM

## 2021-01-11 DIAGNOSIS — E1122 Type 2 diabetes mellitus with diabetic chronic kidney disease: Secondary | ICD-10-CM | POA: Diagnosis not present

## 2021-01-11 DIAGNOSIS — E785 Hyperlipidemia, unspecified: Secondary | ICD-10-CM | POA: Diagnosis not present

## 2021-01-11 DIAGNOSIS — Z7982 Long term (current) use of aspirin: Secondary | ICD-10-CM | POA: Insufficient documentation

## 2021-01-11 DIAGNOSIS — Z5112 Encounter for antineoplastic immunotherapy: Secondary | ICD-10-CM | POA: Diagnosis present

## 2021-01-11 DIAGNOSIS — D638 Anemia in other chronic diseases classified elsewhere: Secondary | ICD-10-CM | POA: Diagnosis not present

## 2021-01-11 DIAGNOSIS — I129 Hypertensive chronic kidney disease with stage 1 through stage 4 chronic kidney disease, or unspecified chronic kidney disease: Secondary | ICD-10-CM | POA: Diagnosis not present

## 2021-01-11 DIAGNOSIS — N189 Chronic kidney disease, unspecified: Secondary | ICD-10-CM | POA: Diagnosis not present

## 2021-01-11 DIAGNOSIS — I252 Old myocardial infarction: Secondary | ICD-10-CM | POA: Diagnosis not present

## 2021-01-11 DIAGNOSIS — Z7984 Long term (current) use of oral hypoglycemic drugs: Secondary | ICD-10-CM | POA: Insufficient documentation

## 2021-01-11 DIAGNOSIS — C3491 Malignant neoplasm of unspecified part of right bronchus or lung: Secondary | ICD-10-CM

## 2021-01-11 DIAGNOSIS — C787 Secondary malignant neoplasm of liver and intrahepatic bile duct: Secondary | ICD-10-CM | POA: Diagnosis present

## 2021-01-11 DIAGNOSIS — R5383 Other fatigue: Secondary | ICD-10-CM

## 2021-01-11 DIAGNOSIS — Z95828 Presence of other vascular implants and grafts: Secondary | ICD-10-CM

## 2021-01-11 LAB — CBC WITH DIFFERENTIAL (CANCER CENTER ONLY)
Abs Immature Granulocytes: 0.02 10*3/uL (ref 0.00–0.07)
Basophils Absolute: 0.1 10*3/uL (ref 0.0–0.1)
Basophils Relative: 1 %
Eosinophils Absolute: 0.2 10*3/uL (ref 0.0–0.5)
Eosinophils Relative: 3 %
HCT: 31.9 % — ABNORMAL LOW (ref 39.0–52.0)
Hemoglobin: 10.3 g/dL — ABNORMAL LOW (ref 13.0–17.0)
Immature Granulocytes: 0 %
Lymphocytes Relative: 38 %
Lymphs Abs: 2.8 10*3/uL (ref 0.7–4.0)
MCH: 29.1 pg (ref 26.0–34.0)
MCHC: 32.3 g/dL (ref 30.0–36.0)
MCV: 90.1 fL (ref 80.0–100.0)
Monocytes Absolute: 0.7 10*3/uL (ref 0.1–1.0)
Monocytes Relative: 10 %
Neutro Abs: 3.6 10*3/uL (ref 1.7–7.7)
Neutrophils Relative %: 48 %
Platelet Count: 267 10*3/uL (ref 150–400)
RBC: 3.54 MIL/uL — ABNORMAL LOW (ref 4.22–5.81)
RDW: 13.4 % (ref 11.5–15.5)
WBC Count: 7.5 10*3/uL (ref 4.0–10.5)
nRBC: 0 % (ref 0.0–0.2)

## 2021-01-11 LAB — CMP (CANCER CENTER ONLY)
ALT: 14 U/L (ref 0–44)
AST: 17 U/L (ref 15–41)
Albumin: 4 g/dL (ref 3.5–5.0)
Alkaline Phosphatase: 63 U/L (ref 38–126)
Anion gap: 10 (ref 5–15)
BUN: 34 mg/dL — ABNORMAL HIGH (ref 8–23)
CO2: 23 mmol/L (ref 22–32)
Calcium: 9.3 mg/dL (ref 8.9–10.3)
Chloride: 107 mmol/L (ref 98–111)
Creatinine: 2.27 mg/dL — ABNORMAL HIGH (ref 0.61–1.24)
GFR, Estimated: 31 mL/min — ABNORMAL LOW (ref >60)
Glucose, Bld: 184 mg/dL — ABNORMAL HIGH (ref 70–99)
Potassium: 4.4 mmol/L (ref 3.5–5.1)
Sodium: 140 mmol/L (ref 135–145)
Total Bilirubin: 0.3 mg/dL (ref 0.3–1.2)
Total Protein: 8 g/dL (ref 6.5–8.1)

## 2021-01-11 LAB — TSH: TSH: 0.493 u[IU]/mL (ref 0.320–4.118)

## 2021-01-11 MED ORDER — SODIUM CHLORIDE 0.9% FLUSH
10.0000 mL | Freq: Once | INTRAVENOUS | Status: AC
Start: 2021-01-11 — End: 2021-01-11
  Administered 2021-01-11: 10 mL

## 2021-01-11 MED ORDER — HEPARIN SOD (PORK) LOCK FLUSH 100 UNIT/ML IV SOLN
500.0000 [IU] | Freq: Once | INTRAVENOUS | Status: AC | PRN
  Administered 2021-01-11: 500 [IU]

## 2021-01-11 MED ORDER — SODIUM CHLORIDE 0.9% FLUSH
10.0000 mL | INTRAVENOUS | Status: DC | PRN
  Administered 2021-01-11: 10 mL

## 2021-01-11 MED ORDER — SODIUM CHLORIDE 0.9 % IV SOLN
200.0000 mg | Freq: Once | INTRAVENOUS | Status: AC
  Administered 2021-01-11: 200 mg via INTRAVENOUS
  Filled 2021-01-11: qty 8

## 2021-01-11 MED ORDER — SODIUM CHLORIDE 0.9 % IV SOLN
Freq: Once | INTRAVENOUS | Status: AC
Start: 2021-01-11 — End: 2021-01-11

## 2021-01-11 NOTE — Progress Notes (Signed)
[facility] [phone]   [phone]  OFFICE PROGRESS NOTE  [name], NP [address] [name] [address]  DIAGNOSIS: Stage IV (TX, N3, M1c) non-small cell lung cancer, poorly differentiated adenocarcinoma presented with bulky mediastinal lymphadenopathy in addition to right axillary lymphadenopathy and metastatic liver lesions, abdominal lymphadenopathy diagnosed in January 2021. Molecular studies by Guard[facility] 360 that shows no actionable mutations.  PRIOR THERAPY: None  CURRENT THERAPY: Systemic chemotherapy with carboplatin for AUC of 5, Alimta 500 mg/M2 and Keytruda 200 mg IV every 3 weeks.  First dose July 14, 2019.  Status post 26  cycles. Starting from cycle #5 the patient will be treated with maintenance Alimta and Keytruda every 3 weeks.  Starting from cycle #12 he will be on single agent treatment with Keytruda secondary to renal insufficiency.  INTERVAL HISTORY: DAJION BICKFORD 65 y.o. male returns to the clinic today for follow-up visit.  The patient is feeling fine today with no concerning complaints.  He called few days ago with significant sore throat and closing of his throat and difficulty breathing.  He is feeling much better now.  He had CT of the neck in addition to repeat CT scan of the chest, abdomen pelvis performed recently to rule out any disease progression.  The patient denied having any current chest pain, shortness of breath, cough or hemoptysis.  He denied having any fever or chills.  He has no nausea, vomiting, diarrhea or constipation.  He has no headache or visual changes.  He has no significant weight loss or night sweats.  He is here today for evaluation and discussion of his scan results and recommendation regarding his condition.  MEDICAL HISTORY: Past Medical History:  Diagnosis Date   Arthritis    "right knee" (09/04/2015)   Atrial flutter with rapid ventricular response ([facility]) 04/19/2019    Dizziness 10/2019   Dyspnea    increased exertion   Elevated serum creatinine 07/2020   HTN (hypertension)    Hyperlipidemia    Lung cancer ([facility]) 05/2019   Microalbuminuria 07/2020   NSTEMI (non-ST elevated myocardial infarction) ([facility]) 09/04/2015   a. cath 09/05/2015: 95% stenosis mid-LAD, 55% mid-RCA, and 40% prox-RCA. PCI performed w/ Synergy DES to LAD   PAD (peripheral artery disease) ([facility])    Stenting of bilateral iliacs in 2003.   Refusal of blood transfusions as patient is Jehovah's Witness    Type II diabetes mellitus ([facility])    Vitamin D deficiency 07/2020    ALLERGIES:  has No Known Allergies.  MEDICATIONS:  Current Outpatient Medications  Medication Sig Dispense Refill   acetaminophen (TYLENOL) 500 MG tablet Take 500 mg by mouth every 6 (six) hours as needed for moderate pain or fever.      aspirin EC 81 MG tablet Take 81 mg by mouth at bedtime.     carvedilol (COREG) 12.5 MG tablet Take 1 tablet (12.5 mg total) by mouth 2 (two) times daily with a meal. 180 tablet 3   cetirizine (ZYRTEC) 10 MG tablet Take 1 tablet (10 mg total) by mouth daily. 90 tablet 3   clopidogrel (PLAVIX) 75 MG tablet Take 1 tablet (75 mg total) by mouth daily. 90 tablet 3   docusate sodium (COLACE) 100 MG capsule Take 1 capsule (100 mg total) by mouth 2 (two) times daily as needed for mild constipation. 180 capsule 3   ferrous sulfate 324 MG TBEC Take 1 tablet (324 mg total) by  mouth daily with breakfast. 30 tablet 11   folic acid (FOLVITE) 1 MG tablet Take 1 tablet (1 mg total) by mouth daily. 90 tablet 3   gabapentin (NEURONTIN) 300 MG capsule Take 1 capsule (300 mg total) by mouth 3 (three) times daily. 270 capsule 3   glimepiride (AMARYL) 4 MG tablet Take 1 tablet (4 mg total) by mouth daily. 90 tablet 3   Hydrocortisone Acetate 1 % CREA Apply 15 g topically 2 (two) times daily. 15 g 3   lactulose (CHRONULAC) 10 GM/15ML solution Take 30 mLs (20 g total) by mouth 2 (two) times daily as needed for  moderate constipation. 240 mL 11   lidocaine-prilocaine (EMLA) cream APPLY TO THE PORT-A-CATH SITE 30-60 MINUTES BEFORE TREATMENT. (Patient taking differently: Apply 1 application topically daily as needed (access port). Apply to the Port-A-Cath site 30-60 minutes before treatment.) 30 g 0   lisinopril (ZESTRIL) 5 MG tablet Take 5 mg by mouth daily.     meclizine (ANTIVERT) 25 MG tablet Take 1 tablet (25 mg total) by mouth 3 (three) times daily as needed for dizziness. 90 tablet 3   metFORMIN (GLUCOPHAGE) 1000 MG tablet Take 1 tablet (1,000 mg total) by mouth 2 (two) times daily with a meal. 180 tablet 3   multivitamin (ONE-A-DAY MEN'S) TABS tablet Take 1 tablet by mouth daily. 30 tablet 6   nitroGLYCERIN (NITROSTAT) 0.4 MG SL tablet Place 1 tablet (0.4 mg total) under the tongue every 5 (five) minutes x 3 doses as needed for chest pain. 25 tablet 2   oxyCODONE-acetaminophen (PERCOCET/ROXICET) 5-325 MG tablet Take 1 tablet by mouth every 6 (six) hours as needed for severe pain. 20 tablet 0   pantoprazole (PROTONIX) 40 MG tablet Take 1 tablet (40 mg total) by mouth daily. 90 tablet 3   prochlorperazine (COMPAZINE) 10 MG tablet Take 1 tablet (10 mg total) by mouth every 6 (six) hours as needed for nausea or vomiting. 30 tablet 11   rosuvastatin (CRESTOR) 40 MG tablet Take 1 tablet (40 mg total) by mouth daily. 90 tablet 3   Vitamin D, Ergocalciferol, (DRISDOL) 1.25 MG (50000 UNIT) CAPS capsule Take 1 capsule (50,000 Units total) by mouth every 7 (seven) days. 5 capsule 3   No current facility-administered medications for this visit.    SURGICAL HISTORY:  Past Surgical History:  Procedure Laterality Date   CARDIAC CATHETERIZATION N/A 09/05/2015   Procedure: Left Heart Cath and Coronary Angiography;  Surgeon: Troy Sine, MD;  Location: [facility];  Service: Cardiovascular;  Laterality: N/A;   CARDIAC CATHETERIZATION N/A 09/05/2015   Procedure: Coronary Stent Intervention;  Surgeon: Troy Sine, MD;  Location: [facility];  Service: Cardiovascular;  Laterality: N/A;   IR IMAGING GUIDED PORT INSERTION  07/15/2019   ORIF CONGENITAL HIP DISLOCATION Left ~ 1965   "put pins in it"   PERIPHERAL VASCULAR CATHETERIZATION Bilateral 2003   "stenting"   TONSILLECTOMY  ~ [facility]:  Constitutional: positive for fatigue Eyes: negative Ears, nose, mouth, throat, and face: negative Respiratory: negative Cardiovascular: negative Gastrointestinal: negative Genitourinary:negative Integument/breast: negative Hematologic/lymphatic: negative Musculoskeletal:negative Neurological: negative Behavioral/Psych: negative Endocrine: negative Allergic/Immunologic: negative   PHYSICAL EXAMINATION: General appearance: alert, cooperative, appears stated age, and no distress Head: Normocephalic, without obvious abnormality, atraumatic Neck: no adenopathy, no JVD, supple, symmetrical, trachea midline, and thyroid not enlarged, symmetric, no tenderness/mass/nodules Lymph nodes: Cervical, supraclavicular, and axillary nodes normal. Resp: clear to auscultation bilaterally Back: symmetric,  no curvature. ROM normal. No CVA tenderness. Cardio: regular rate and rhythm, S1, S2 normal, no murmur, click, rub or gallop GI: soft, non-tender; bowel sounds normal; no masses,  no organomegaly Extremities: extremities normal, atraumatic, no cyanosis or edema Neurologic: Alert and oriented X 3, normal strength and tone. Normal symmetric reflexes. Normal coordination and gait  ECOG PERFORMANCE STATUS: 1 - Symptomatic but completely ambulatory  Blood pressure (!) 124/50, pulse 96, temperature (!) 97.4 F (36.3 C), temperature source Tympanic, resp. rate 20, height 6\' 3"  (1.905 m), weight 226 lb 1.6 oz (102.6 kg), SpO2 99 %.  LABORATORY DATA: Lab Results  Component Value Date   WBC 7.5 01/11/2021   HGB 10.3 (L) 01/11/2021   HCT 31.9 (L) 01/11/2021   MCV 90.1 01/11/2021   PLT 267  01/11/2021      Chemistry      Component Value Date/Time   NA 139 12/21/2020 0902   NA 140 06/08/2015 0840   K 4.5 12/21/2020 0902   CL 106 12/21/2020 0902   CO2 25 12/21/2020 0902   BUN 26 (H) 12/21/2020 0902   BUN 15 06/08/2015 0840   CREATININE 1.94 (H) 12/21/2020 0902   CREATININE 1.06 10/01/2016 0855      Component Value Date/Time   CALCIUM 9.8 12/21/2020 0902   ALKPHOS 70 12/21/2020 0902   AST 17 12/21/2020 0902   ALT 12 12/21/2020 0902   BILITOT 0.3 12/21/2020 0902       RADIOGRAPHIC STUDIES: CT SOFT TISSUE NECK WO CONTRAST  Result Date: 01/08/2021 CLINICAL DATA:  Lung cancer EXAM: CT NECK WITHOUT CONTRAST TECHNIQUE: Multidetector CT imaging of the neck was performed following the standard protocol without intravenous contrast. COMPARISON:  None. FINDINGS: Pharynx and larynx: Posterior hypopharyngeal wall thickening above the level of the cricoid with ill-defined area of relatively decreased density (series 2, image 61). Soft tissue thickening extends into the lower posterior oropharyngeal wall. Piriform sinuses are effaced. Vocal folds are apposed. Salivary glands: Parotid and submandibular glands are unremarkable. Thyroid: Normal. Lymph nodes: No enlarged or abnormal density nodes. Vascular: Mild calcified plaque at the common carotid bifurcations. Limited intracranial: No acute abnormality. Visualized orbits: Unremarkable. Mastoids and visualized paranasal sinuses: Mild lobular paranasal sinus mucosal thickening. Mastoid air cells are clear. Skeleton: Cervical spine degenerative changes. Upper chest: Dictated separately. Other: None. IMPRESSION: Posterior hypopharyngeal and lower oropharyngeal wall thickening with an ill-defined area of relatively decreased density. May reflect a necrotic lesion. No adenopathy. Electronically Signed   By: Macy Mis M.D.   On: 01/08/2021 16:49   CT CHEST ABDOMEN PELVIS WO CONTRAST  Result Date: 01/09/2021 CLINICAL DATA:  Non-small cell  lung cancer staging in a 65 year old male. EXAM: CT CHEST, ABDOMEN AND PELVIS WITHOUT CONTRAST TECHNIQUE: Multidetector CT imaging of the chest, abdomen and pelvis was performed following the standard protocol without IV contrast. COMPARISON:  November 07, 2020. FINDINGS: CT CHEST FINDINGS Cardiovascular: Calcified atheromatous plaque of the thoracic aorta. No aneurysmal dilation. Normal caliber of the central pulmonary vasculature. Three-vessel coronary artery calcification. No substantial pericardial effusion. Limited assessment of cardiovascular structures given lack of intravenous contrast. RIGHT-sided Port-A-Cath terminating at the caval to atrial junction as before. Mediastinum/Nodes: Mildly patulous esophagus. No signs of adenopathy in the chest. Lungs/Pleura: Mild to moderate pulmonary emphysema shows a similar appearance to prior imaging. Bandlike opacity in the RIGHT middle lobe without change compared to the prior study. No lobar consolidation or sign of pleural effusion. No new pulmonary nodules. Airways are patent. Musculoskeletal: See below for  full musculoskeletal details. No acute findings or destructive findings related to the bony thorax. CT ABDOMEN PELVIS FINDINGS Hepatobiliary: Stable hypodensities in the liver largest along the margin of the gallbladder fossa compatible with known hepatic hemangioma. No pericholecystic stranding. No sign of biliary duct dilation grossly. Pancreas: Normal contour without signs of inflammation or adjacent fluid. Spleen: Normal contour and size. Adrenals/Urinary Tract: Adrenal glands are normal. Symmetric renal contours with mild cortical scarring. No nephrolithiasis. No hydronephrosis. No perinephric stranding. No perivesical stranding. Stomach/Bowel: Small hiatal hernia. The small bowel is normal caliber. Appendix is normal. Minimal perienteric stranding in the RIGHT lower quadrant adjacent to the terminal ileum without adjacent fluid. Colon is largely stool filled  without signs of adjacent inflammation. Vascular/Lymphatic: Aortic and iliac atherosclerotic changes. Signs of bilateral iliac artery stenting. No aneurysmal dilation of the abdominal aorta. Mildly enlarged celiac lymph node on image 55 of series 2 measures 16 mm short axis, unchanged. Mildly prominent lymph nodes elsewhere in this region are similarly stable. No pelvic sidewall lymphadenopathy. Reproductive: Unremarkable. Other: No ascites. Musculoskeletal: Spinal degenerative changes. No acute or destructive bone process. Post ORIF of LEFT proximal femur as before. IMPRESSION: 1. Minimal perienteric stranding and question of enteric thickening in the RIGHT lower quadrant adjacent to and involving the terminal ileum without adjacent fluid. Findings raise the question of developing enteritis. Correlate with any new or worsening abdominal pain. 2. Stable mildly enlarged celiac lymph node, attention on follow-up. No new or progressive findings. 3. Emphysema and aortic atherosclerosis. Three-vessel coronary artery calcification. Aortic Atherosclerosis (ICD10-I70.0) and Emphysema (ICD10-J43.9). These results will be called to the ordering clinician or representative by the Radiologist Assistant, and communication documented in the PACS or [employer]. Electronically Signed   By: Zetta Bills M.D.   On: 01/09/2021 10:11    ASSESSMENT AND PLAN: This is a very pleasant 65 years old African-American male recently diagnosed with a stage IV (TX, N3, M1c) non-small cell lung cancer, poorly differentiated adenocarcinoma presented with bulky mediastinal as well as right hilar, supraclavicular, abdominal and right axillary lymphadenopathy in addition to metastatic liver lesions diagnosed in January 2021. Molecular studies by guardant 360 shows no actionable mutations.  The patient is not a candidate for treatment with targeted therapy or enrollment in the clinical trial with the [facility] regimen. The patient is  currently undergoing systemic chemotherapy with carboplatin for AUC of 5, Alimta 500 mg/M2 and Keytruda 200 mg IV every 3 weeks status post 26 cycles. Starting from cycle #5 he is treated with maintenance Alimta and Keytruda every 3 weeks.  Starting from cycle #12 the patient has been on treatment with single agent Keytruda because of the renal insufficiency. The patient continues to tolerate this treatment well with no concerning adverse effects. Regarding the sore throat and difficulty breathing, he had CT scan of the neck, chest, abdomen pelvis performed recently. I personally and independently reviewed the scans and discussed the results with the patient today. His scan showed no concerning findings for disease progression and there was minimal perienteric stranding suspicious for early enteritis but the patient has no gastrointestinal symptoms and no diarrhea. His neck scan showed posterior hypopharyngeal and lower oropharyngeal wall thickening with an ill-defined area of relatively decreased density but no lymphadenopathy.  His sore throat symptoms have significantly improved.  The patient was in [place] recently and ate a lot of seafood and this could be a food allergy.  I recommended for him to contact his ENT physician Dr. Lucia Gaskins for evaluation if  he has any recurrent symptoms. The patient will proceed with cycle #27 today as planned. I will see him back for follow-up visit in 3 weeks for evaluation before the next cycle of his treatment. He was advised to call immediately if he has any other concerning symptoms in the interval. For the anemia of chronic disease, we will continue to monitor his hemoglobin and hematocrit closely.  The patient was also advised to continue with the oral iron tablets. For the chronic renal insufficiency he is followed by nephrology.  The patient voices understanding of current disease status and treatment options and is in agreement with the current care  plan.  All questions were answered. The patient knows to call the clinic with any problems, questions or concerns. We can certainly see the patient much sooner if necessary.  Disclaimer: This note was dictated with voice recognition software. Similar sounding words can inadvertently be transcribed and may not be corrected upon review.

## 2021-01-11 NOTE — Patient Instructions (Signed)
[facility]  Discharge Instructions: Thank you for choosing [facility] to provide your oncology and hematology care.   If you have a lab appointment with the [facility], please go directly to the [facility] and check in at the registration area.   Wear comfortable clothing and clothing appropriate for easy access to any Portacath or PICC line.   We strive to give you quality time with your provider. You may need to reschedule your appointment if you arrive late (15 or more minutes).  Arriving late affects you and other patients whose appointments are after yours.  Also, if you miss three or more appointments without notifying the office, you may be dismissed from the clinic at the provider's discretion.      For prescription refill requests, have your pharmacy contact our office and allow 72 hours for refills to be completed.    Today you received the following chemotherapy and/or immunotherapy agents Beryle Flock      To help prevent nausea and vomiting after your treatment, we encourage you to take your nausea medication as directed.  BELOW ARE SYMPTOMS THAT SHOULD BE REPORTED IMMEDIATELY: *FEVER GREATER THAN 100.4 F (38 C) OR HIGHER *CHILLS OR SWEATING *NAUSEA AND VOMITING THAT IS NOT CONTROLLED WITH YOUR NAUSEA MEDICATION *UNUSUAL SHORTNESS OF BREATH *UNUSUAL BRUISING OR BLEEDING *URINARY PROBLEMS (pain or burning when urinating, or frequent urination) *BOWEL PROBLEMS (unusual diarrhea, constipation, pain near the anus) TENDERNESS IN MOUTH AND THROAT WITH OR WITHOUT PRESENCE OF ULCERS (sore throat, sores in mouth, or a toothache) UNUSUAL RASH, SWELLING OR PAIN  UNUSUAL VAGINAL DISCHARGE OR ITCHING   Items with * indicate a potential emergency and should be followed up as soon as possible or go to the Emergency Department if any problems should occur.  Please show the CHEMOTHERAPY ALERT CARD or IMMUNOTHERAPY ALERT CARD at check-in to  the Emergency Department and triage nurse.  Should you have questions after your visit or need to cancel or reschedule your appointment, please contact [facility]  Dept: [phone]  and follow the prompts.  Office hours are 8:00 a.m. to 4:30 p.m. Monday - Friday. Please note that voicemails left after 4:00 p.m. may not be returned until the following business day.  We are closed weekends and major holidays. You have access to a nurse at all times for urgent questions. Please call the main number to the clinic Dept: [phone] and follow the prompts.   For any non-urgent questions, you may also contact your provider using MyChart. We now offer e-Visits for anyone 31 and older to request care online for non-urgent symptoms. For details visit [email or web address].   Also download the MyChart app! Go to the app store, search "MyChart", open the app, select [facility], and log in with your MyChart username and password.  Due to Covid, a mask is required upon entering the hospital/clinic. If you do not have a mask, one will be given to you upon arrival. For doctor visits, patients may have 1 support person aged 32 or older with them. For treatment visits, patients cannot have anyone with them due to current Covid guidelines and our immunocompromised population.

## 2021-01-11 NOTE — Telephone Encounter (Signed)
Scheduled appt per 8/18 sch msg. Pt aware.

## 2021-01-11 NOTE — Progress Notes (Signed)
Per Dr. [name], pt is okay to treat today with creatinine of 2.27.

## 2021-01-12 ENCOUNTER — Ambulatory Visit: Payer: Medicare Other | Admitting: Nurse Practitioner

## 2021-01-15 ENCOUNTER — Encounter: Payer: Self-pay | Admitting: Internal Medicine

## 2021-02-01 ENCOUNTER — Other Ambulatory Visit: Payer: Self-pay

## 2021-02-01 ENCOUNTER — Inpatient Hospital Stay (HOSPITAL_BASED_OUTPATIENT_CLINIC_OR_DEPARTMENT_OTHER): Payer: Medicare Other | Admitting: Physician Assistant

## 2021-02-01 ENCOUNTER — Inpatient Hospital Stay: Payer: Medicare Other

## 2021-02-01 ENCOUNTER — Inpatient Hospital Stay: Payer: Medicare Other | Attending: Internal Medicine

## 2021-02-01 VITALS — BP 148/63 | HR 77 | Temp 97.4°F | Resp 18

## 2021-02-01 DIAGNOSIS — Z5112 Encounter for antineoplastic immunotherapy: Secondary | ICD-10-CM | POA: Diagnosis present

## 2021-02-01 DIAGNOSIS — C3490 Malignant neoplasm of unspecified part of unspecified bronchus or lung: Secondary | ICD-10-CM

## 2021-02-01 DIAGNOSIS — C3491 Malignant neoplasm of unspecified part of right bronchus or lung: Secondary | ICD-10-CM

## 2021-02-01 DIAGNOSIS — Z79899 Other long term (current) drug therapy: Secondary | ICD-10-CM | POA: Insufficient documentation

## 2021-02-01 DIAGNOSIS — C787 Secondary malignant neoplasm of liver and intrahepatic bile duct: Secondary | ICD-10-CM | POA: Diagnosis present

## 2021-02-01 DIAGNOSIS — R5383 Other fatigue: Secondary | ICD-10-CM

## 2021-02-01 DIAGNOSIS — R1310 Dysphagia, unspecified: Secondary | ICD-10-CM

## 2021-02-01 DIAGNOSIS — R591 Generalized enlarged lymph nodes: Secondary | ICD-10-CM

## 2021-02-01 DIAGNOSIS — N189 Chronic kidney disease, unspecified: Secondary | ICD-10-CM | POA: Diagnosis not present

## 2021-02-01 DIAGNOSIS — Z95828 Presence of other vascular implants and grafts: Secondary | ICD-10-CM

## 2021-02-01 LAB — CBC WITH DIFFERENTIAL (CANCER CENTER ONLY)
Abs Immature Granulocytes: 0.01 10*3/uL (ref 0.00–0.07)
Basophils Absolute: 0.1 10*3/uL (ref 0.0–0.1)
Basophils Relative: 1 %
Eosinophils Absolute: 0.3 10*3/uL (ref 0.0–0.5)
Eosinophils Relative: 4 %
HCT: 29.8 % — ABNORMAL LOW (ref 39.0–52.0)
Hemoglobin: 9.4 g/dL — ABNORMAL LOW (ref 13.0–17.0)
Immature Granulocytes: 0 %
Lymphocytes Relative: 35 %
Lymphs Abs: 2.3 10*3/uL (ref 0.7–4.0)
MCH: 28.6 pg (ref 26.0–34.0)
MCHC: 31.5 g/dL (ref 30.0–36.0)
MCV: 90.6 fL (ref 80.0–100.0)
Monocytes Absolute: 0.6 10*3/uL (ref 0.1–1.0)
Monocytes Relative: 9 %
Neutro Abs: 3.5 10*3/uL (ref 1.7–7.7)
Neutrophils Relative %: 51 %
Platelet Count: 252 10*3/uL (ref 150–400)
RBC: 3.29 MIL/uL — ABNORMAL LOW (ref 4.22–5.81)
RDW: 13.7 % (ref 11.5–15.5)
WBC Count: 6.7 10*3/uL (ref 4.0–10.5)
nRBC: 0 % (ref 0.0–0.2)

## 2021-02-01 LAB — CMP (CANCER CENTER ONLY)
ALT: 14 U/L (ref 0–44)
AST: 19 U/L (ref 15–41)
Albumin: 3.9 g/dL (ref 3.5–5.0)
Alkaline Phosphatase: 62 U/L (ref 38–126)
Anion gap: 11 (ref 5–15)
BUN: 23 mg/dL (ref 8–23)
CO2: 22 mmol/L (ref 22–32)
Calcium: 9.7 mg/dL (ref 8.9–10.3)
Chloride: 107 mmol/L (ref 98–111)
Creatinine: 2.14 mg/dL — ABNORMAL HIGH (ref 0.61–1.24)
GFR, Estimated: 33 mL/min — ABNORMAL LOW (ref >60)
Glucose, Bld: 204 mg/dL — ABNORMAL HIGH (ref 70–99)
Potassium: 4.2 mmol/L (ref 3.5–5.1)
Sodium: 140 mmol/L (ref 135–145)
Total Bilirubin: 0.3 mg/dL (ref 0.3–1.2)
Total Protein: 7.7 g/dL (ref 6.5–8.1)

## 2021-02-01 LAB — TSH: TSH: 0.422 u[IU]/mL (ref 0.320–4.118)

## 2021-02-01 MED ORDER — HEPARIN SOD (PORK) LOCK FLUSH 100 UNIT/ML IV SOLN
500.0000 [IU] | Freq: Once | INTRAVENOUS | Status: AC | PRN
  Administered 2021-02-01: 500 [IU]

## 2021-02-01 MED ORDER — SODIUM CHLORIDE 0.9 % IV SOLN
200.0000 mg | Freq: Once | INTRAVENOUS | Status: AC
  Administered 2021-02-01: 200 mg via INTRAVENOUS
  Filled 2021-02-01: qty 8

## 2021-02-01 MED ORDER — SODIUM CHLORIDE 0.9 % IV SOLN: Freq: Once | INTRAVENOUS | Status: AC

## 2021-02-01 MED ORDER — SODIUM CHLORIDE 0.9% FLUSH
10.0000 mL | INTRAVENOUS | Status: DC | PRN
  Administered 2021-02-01: 10 mL

## 2021-02-01 MED ORDER — SODIUM CHLORIDE 0.9% FLUSH
10.0000 mL | Freq: Once | INTRAVENOUS | Status: AC
  Administered 2021-02-01: 10 mL

## 2021-02-01 NOTE — Patient Instructions (Signed)
[facility]  Discharge Instructions: Thank you for choosing [facility] to provide your oncology and hematology care.   If you have a lab appointment with the [facility], please go directly to the [facility] and check in at the registration area.   Wear comfortable clothing and clothing appropriate for easy access to any Portacath or PICC line.   We strive to give you quality time with your provider. You may need to reschedule your appointment if you arrive late (15 or more minutes).  Arriving late affects you and other patients whose appointments are after yours.  Also, if you miss three or more appointments without notifying the office, you may be dismissed from the clinic at the provider's discretion.      For prescription refill requests, have your pharmacy contact our office and allow 72 hours for refills to be completed.    Today you received the following chemotherapy and/or immunotherapy agents Beryle Flock      To help prevent nausea and vomiting after your treatment, we encourage you to take your nausea medication as directed.  BELOW ARE SYMPTOMS THAT SHOULD BE REPORTED IMMEDIATELY: *FEVER GREATER THAN 100.4 F (38 C) OR HIGHER *CHILLS OR SWEATING *NAUSEA AND VOMITING THAT IS NOT CONTROLLED WITH YOUR NAUSEA MEDICATION *UNUSUAL SHORTNESS OF BREATH *UNUSUAL BRUISING OR BLEEDING *URINARY PROBLEMS (pain or burning when urinating, or frequent urination) *BOWEL PROBLEMS (unusual diarrhea, constipation, pain near the anus) TENDERNESS IN MOUTH AND THROAT WITH OR WITHOUT PRESENCE OF ULCERS (sore throat, sores in mouth, or a toothache) UNUSUAL RASH, SWELLING OR PAIN  UNUSUAL VAGINAL DISCHARGE OR ITCHING   Items with * indicate a potential emergency and should be followed up as soon as possible or go to the Emergency Department if any problems should occur.  Please show the CHEMOTHERAPY ALERT CARD or IMMUNOTHERAPY ALERT CARD at check-in to  the Emergency Department and triage nurse.  Should you have questions after your visit or need to cancel or reschedule your appointment, please contact [facility]  Dept: [phone]  and follow the prompts.  Office hours are 8:00 a.m. to 4:30 p.m. Monday - Friday. Please note that voicemails left after 4:00 p.m. may not be returned until the following business day.  We are closed weekends and major holidays. You have access to a nurse at all times for urgent questions. Please call the main number to the clinic Dept: [phone] and follow the prompts.   For any non-urgent questions, you may also contact your provider using MyChart. We now offer e-Visits for anyone 50 and older to request care online for non-urgent symptoms. For details visit [email or web address].   Also download the MyChart app! Go to the app store, search "MyChart", open the app, select [facility], and log in with your MyChart username and password.  Due to Covid, a mask is required upon entering the hospital/clinic. If you do not have a mask, one will be given to you upon arrival. For doctor visits, patients may have 1 support person aged 28 or older with them. For treatment visits, patients cannot have anyone with them due to current Covid guidelines and our immunocompromised population.

## 2021-02-01 NOTE — Progress Notes (Signed)
[facility] OFFICE PROGRESS NOTE  [name], NP [address] [place] [address]  DIAGNOSIS: Stage IV ([place], N3, M1c) non-small cell lung cancer, poorly differentiated adenocarcinoma presented with bulky mediastinal lymphadenopathy in addition to right axillary lymphadenopathy and metastatic liver lesions, abdominal lymphadenopathy diagnosed in January 2021. Molecular studies by [employer] that shows no actionable mutations.   PRIOR THERAPY: None  CURRENT THERAPY: Systemic chemotherapy with carboplatin for AUC of 5, Alimta 500 mg/M2 and Keytruda 200 mg IV every 3 weeks.  First dose July 14, 2019.  Status post 27 cycles. Starting from cycle #5 the patient will be treated with maintenance Alimta and Keytruda every 3 weeks. Starting from cycle #12 he will be on single agent treatment with Keytruda secondary to renal insufficiency.  INTERVAL HISTORY: Kirk [name] 65 y.o. male returns to clinic today for a follow-up visit.  The patient is feeling fairly well today without any concerning complaints.  He sometimes has issues after eating where he feels like he needs to clear his throat.  He is seeing Dr. Lucia Gaskins from ENT in the near future.  He also notes some issues with constipation.  He is prescribed lactulose and he also takes Colace.  However, when he takes the lactulose it causes him to have a bowel movement within 15 minutes.  Of note, the patient states that he is not taking this medication as prescribed and is not measuring the amount he is taking and admits he may be taking too much.  He denies any fever, chills, night sweats, or appetite changes.  He reports stable dyspnea on exertion.  Denies any chest pain, hemoptysis, or cough.  Denies any nausea, vomiting, or diarrhea.  Denies any rashes or skin changes.  Denies any headaches or visual changes.  The patient is here today for evaluation and repeat blood work before starting cycle #28.    MEDICAL  HISTORY: Past Medical History:  Diagnosis Date   Arthritis    "right knee" (09/04/2015)   Atrial flutter with rapid ventricular response ([facility]) 04/19/2019   Dizziness 10/2019   Dyspnea    increased exertion   Elevated serum creatinine 07/2020   HTN (hypertension)    Hyperlipidemia    Lung cancer ([facility]) 05/2019   Microalbuminuria 07/2020   NSTEMI (non-ST elevated myocardial infarction) ([facility]) 09/04/2015   a. cath 09/05/2015: 95% stenosis mid-LAD, 55% mid-RCA, and 40% prox-RCA. PCI performed w/ Synergy DES to LAD   PAD (peripheral artery disease) ([facility])    Stenting of bilateral iliacs in 2003.   Refusal of blood transfusions as patient is Jehovah's Witness    Type II diabetes mellitus ([facility])    Vitamin D deficiency 07/2020    ALLERGIES:  has No Known Allergies.  MEDICATIONS:  Current Outpatient Medications  Medication Sig Dispense Refill   acetaminophen (TYLENOL) 500 MG tablet Take 500 mg by mouth every 6 (six) hours as needed for moderate pain or fever.      aspirin EC 81 MG tablet Take 81 mg by mouth at bedtime.     carvedilol (COREG) 12.5 MG tablet Take 1 tablet (12.5 mg total) by mouth 2 (two) times daily with a meal. 180 tablet 3   cetirizine (ZYRTEC) 10 MG tablet Take 1 tablet (10 mg total) by mouth daily. 90 tablet 3   clopidogrel (PLAVIX) 75 MG tablet Take 1 tablet (75 mg total) by mouth daily. 90 tablet 3   docusate sodium (COLACE) 100 MG capsule Take 1  capsule (100 mg total) by mouth 2 (two) times daily as needed for mild constipation. 180 capsule 3   ferrous sulfate 324 MG TBEC Take 1 tablet (324 mg total) by mouth daily with breakfast. 30 tablet 11   folic acid (FOLVITE) 1 MG tablet Take 1 tablet (1 mg total) by mouth daily. 90 tablet 3   gabapentin (NEURONTIN) 300 MG capsule Take 1 capsule (300 mg total) by mouth 3 (three) times daily. 270 capsule 3   glimepiride (AMARYL) 4 MG tablet Take 1 tablet (4 mg total) by mouth daily. 90 tablet 3   Hydrocortisone Acetate 1 % CREA  Apply 15 g topically 2 (two) times daily. 15 g 3   lactulose (CHRONULAC) 10 GM/15ML solution Take 30 mLs (20 g total) by mouth 2 (two) times daily as needed for moderate constipation. 240 mL 11   lidocaine-prilocaine (EMLA) cream APPLY TO THE PORT-A-CATH SITE 30-60 MINUTES BEFORE TREATMENT. (Patient taking differently: Apply 1 application topically daily as needed (access port). Apply to the Port-A-Cath site 30-60 minutes before treatment.) 30 g 0   lisinopril (ZESTRIL) 5 MG tablet Take 5 mg by mouth daily.     meclizine (ANTIVERT) 25 MG tablet Take 1 tablet (25 mg total) by mouth 3 (three) times daily as needed for dizziness. 90 tablet 3   metFORMIN (GLUCOPHAGE) 1000 MG tablet Take 1 tablet (1,000 mg total) by mouth 2 (two) times daily with a meal. 180 tablet 3   multivitamin (ONE-A-DAY MEN'S) TABS tablet Take 1 tablet by mouth daily. 30 tablet 6   nitroGLYCERIN (NITROSTAT) 0.4 MG SL tablet Place 1 tablet (0.4 mg total) under the tongue every 5 (five) minutes x 3 doses as needed for chest pain. 25 tablet 2   oxyCODONE-acetaminophen (PERCOCET/ROXICET) 5-325 MG tablet Take 1 tablet by mouth every 6 (six) hours as needed for severe pain. 20 tablet 0   pantoprazole (PROTONIX) 40 MG tablet Take 1 tablet (40 mg total) by mouth daily. 90 tablet 3   prochlorperazine (COMPAZINE) 10 MG tablet Take 1 tablet (10 mg total) by mouth every 6 (six) hours as needed for nausea or vomiting. 30 tablet 11   rosuvastatin (CRESTOR) 40 MG tablet Take 1 tablet (40 mg total) by mouth daily. 90 tablet 3   Vitamin D, Ergocalciferol, (DRISDOL) 1.25 MG (50000 UNIT) CAPS capsule Take 1 capsule (50,000 Units total) by mouth every 7 (seven) days. 5 capsule 3   No current facility-administered medications for this visit.   Facility-Administered Medications Ordered in Other Visits  Medication Dose Route Frequency Provider Last Rate Last Admin   heparin lock flush 100 unit/mL  500 Units Intracatheter Once PRN Curt Bears, MD        pembrolizumab [facility]) 200 mg in sodium chloride 0.9 % 50 mL chemo infusion  200 mg Intravenous Once Curt Bears, MD       sodium chloride flush (NS) 0.9 % injection 10 mL  10 mL Intracatheter PRN Curt Bears, MD        SURGICAL HISTORY:  Past Surgical History:  Procedure Laterality Date   CARDIAC CATHETERIZATION N/A 09/05/2015   Procedure: Left Heart Cath and Coronary Angiography;  Surgeon: Troy Sine, MD;  Location: [facility];  Service: Cardiovascular;  Laterality: N/A;   CARDIAC CATHETERIZATION N/A 09/05/2015   Procedure: Coronary Stent Intervention;  Surgeon: Troy Sine, MD;  Location: [facility];  Service: Cardiovascular;  Laterality: N/A;   IR IMAGING GUIDED PORT INSERTION  07/15/2019  ORIF CONGENITAL HIP DISLOCATION Left ~ 1965   "put pins in it"   PERIPHERAL VASCULAR CATHETERIZATION Bilateral 2003   "stenting"   TONSILLECTOMY  ~ 1963    REVIEW OF SYSTEMS:   Review of Systems  Constitutional: Negative for appetite change, chills, fatigue, fever and unexpected weight change.  HENT: Positive for some mild dysphagia. Negative for mouth sores, nosebleeds, sore throat and trouble swallowing.   Eyes: Negative for eye problems and icterus.  Respiratory: Positive for baseline dyspnea on exertion. Negative for cough, hemoptysis, and wheezing.   Cardiovascular: Negative for chest pain and leg swelling.  Gastrointestinal: Positive for constipation. Negative for abdominal pain, constipation, diarrhea, nausea and vomiting.  Genitourinary: Negative for bladder incontinence, difficulty urinating, dysuria, frequency and hematuria.   Musculoskeletal: Negative for back pain, gait problem, neck pain and neck stiffness.  Skin: Negative for itching and rash.  Neurological: Negative for dizziness, extremity weakness, gait problem, headaches, light-headedness and seizures.  Hematological: Negative for adenopathy. Does not bruise/bleed easily.   Psychiatric/Behavioral: Negative for confusion, depression and sleep disturbance. The patient is not nervous/anxious.     PHYSICAL EXAMINATION:  There were no vitals taken for this visit.  ECOG PERFORMANCE STATUS: 1  Physical Exam  Constitutional: Oriented to person, place, and time and well-developed, well-nourished, and in no distress.  HENT:  Head: Normocephalic and atraumatic.  Mouth/Throat: Oropharynx is clear and moist. No oropharyngeal exudate.  Eyes: Conjunctivae are normal. Right eye exhibits no discharge. Left eye exhibits no discharge. No scleral icterus.  Neck: Normal range of motion. Neck supple.  Cardiovascular: Normal rate, regular rhythm, normal heart sounds and intact distal pulses.   Pulmonary/Chest: Effort normal and breath sounds normal. No respiratory distress. No wheezes. No rales.  Abdominal: Soft. Bowel sounds are normal. Exhibits no distension and no mass. There is no tenderness.  Musculoskeletal: Normal range of motion. Exhibits no edema.  Lymphadenopathy:    No cervical adenopathy.  Neurological: Alert and oriented to person, place, and time. Exhibits normal muscle tone. Gait normal. Coordination normal.  Skin: Skin is warm and dry. No rash noted. Not diaphoretic. No erythema. No pallor.  Psychiatric: Mood, memory and judgment normal.  Vitals reviewed.  LABORATORY DATA: Lab Results  Component Value Date   WBC 6.7 02/01/2021   HGB 9.4 (L) 02/01/2021   HCT 29.8 (L) 02/01/2021   MCV 90.6 02/01/2021   PLT 252 02/01/2021      Chemistry      Component Value Date/Time   NA 140 02/01/2021 1140   NA 140 06/08/2015 0840   K 4.2 02/01/2021 1140   CL 107 02/01/2021 1140   CO2 22 02/01/2021 1140   BUN 23 02/01/2021 1140   BUN 15 06/08/2015 0840   CREATININE 2.14 (H) 02/01/2021 1140   CREATININE 1.06 10/01/2016 0855      Component Value Date/Time   CALCIUM 9.7 02/01/2021 1140   ALKPHOS 62 02/01/2021 1140   AST 19 02/01/2021 1140   ALT 14 02/01/2021  1140   BILITOT 0.3 02/01/2021 1140       RADIOGRAPHIC STUDIES:  CT SOFT TISSUE NECK WO CONTRAST  Result Date: 01/08/2021 CLINICAL DATA:  Lung cancer EXAM: CT NECK WITHOUT CONTRAST TECHNIQUE: Multidetector CT imaging of the neck was performed following the standard protocol without intravenous contrast. COMPARISON:  None. FINDINGS: Pharynx and larynx: Posterior hypopharyngeal wall thickening above the level of the cricoid with ill-defined area of relatively decreased density (series 2, image 61). Soft tissue thickening extends into the lower posterior  oropharyngeal wall. Piriform sinuses are effaced. Vocal folds are apposed. Salivary glands: Parotid and submandibular glands are unremarkable. Thyroid: Normal. Lymph nodes: No enlarged or abnormal density nodes. Vascular: Mild calcified plaque at the common carotid bifurcations. Limited intracranial: No acute abnormality. Visualized orbits: Unremarkable. Mastoids and visualized paranasal sinuses: Mild lobular paranasal sinus mucosal thickening. Mastoid air cells are clear. Skeleton: Cervical spine degenerative changes. Upper chest: Dictated separately. Other: None. IMPRESSION: Posterior hypopharyngeal and lower oropharyngeal wall thickening with an ill-defined area of relatively decreased density. May reflect a necrotic lesion. No adenopathy. Electronically Signed   By: Macy Mis M.D.   On: 01/08/2021 16:49   CT CHEST ABDOMEN PELVIS WO CONTRAST  Result Date: 01/09/2021 CLINICAL DATA:  Non-small cell lung cancer staging in a 65 year old male. EXAM: CT CHEST, ABDOMEN AND PELVIS WITHOUT CONTRAST TECHNIQUE: Multidetector CT imaging of the chest, abdomen and pelvis was performed following the standard protocol without IV contrast. COMPARISON:  November 07, 2020. FINDINGS: CT CHEST FINDINGS Cardiovascular: Calcified atheromatous plaque of the thoracic aorta. No aneurysmal dilation. Normal caliber of the central pulmonary vasculature. Three-vessel coronary  artery calcification. No substantial pericardial effusion. Limited assessment of cardiovascular structures given lack of intravenous contrast. RIGHT-sided Port-A-Cath terminating at the caval to atrial junction as before. Mediastinum/Nodes: Mildly patulous esophagus. No signs of adenopathy in the chest. Lungs/Pleura: Mild to moderate pulmonary emphysema shows a similar appearance to prior imaging. Bandlike opacity in the RIGHT middle lobe without change compared to the prior study. No lobar consolidation or sign of pleural effusion. No new pulmonary nodules. Airways are patent. Musculoskeletal: See below for full musculoskeletal details. No acute findings or destructive findings related to the bony thorax. CT ABDOMEN PELVIS FINDINGS Hepatobiliary: Stable hypodensities in the liver largest along the margin of the gallbladder fossa compatible with known hepatic hemangioma. No pericholecystic stranding. No sign of biliary duct dilation grossly. Pancreas: Normal contour without signs of inflammation or adjacent fluid. Spleen: Normal contour and size. Adrenals/Urinary Tract: Adrenal glands are normal. Symmetric renal contours with mild cortical scarring. No nephrolithiasis. No hydronephrosis. No perinephric stranding. No perivesical stranding. Stomach/Bowel: Small hiatal hernia. The small bowel is normal caliber. Appendix is normal. Minimal perienteric stranding in the RIGHT lower quadrant adjacent to the terminal ileum without adjacent fluid. Colon is largely stool filled without signs of adjacent inflammation. Vascular/Lymphatic: Aortic and iliac atherosclerotic changes. Signs of bilateral iliac artery stenting. No aneurysmal dilation of the abdominal aorta. Mildly enlarged celiac lymph node on image 55 of series 2 measures 16 mm short axis, unchanged. Mildly prominent lymph nodes elsewhere in this region are similarly stable. No pelvic sidewall lymphadenopathy. Reproductive: Unremarkable. Other: No ascites.  Musculoskeletal: Spinal degenerative changes. No acute or destructive bone process. Post ORIF of LEFT proximal femur as before. IMPRESSION: 1. Minimal perienteric stranding and question of enteric thickening in the RIGHT lower quadrant adjacent to and involving the terminal ileum without adjacent fluid. Findings raise the question of developing enteritis. Correlate with any new or worsening abdominal pain. 2. Stable mildly enlarged celiac lymph node, attention on follow-up. No new or progressive findings. 3. Emphysema and aortic atherosclerosis. Three-vessel coronary artery calcification. Aortic Atherosclerosis (ICD10-I70.0) and Emphysema (ICD10-J43.9). These results will be called to the ordering clinician or representative by the Radiologist Assistant, and communication documented in the PACS or [employer]. Electronically Signed   By: Zetta Bills M.D.   On: 01/09/2021 10:11     ASSESSMENT/PLAN:  This is a very pleasant 65 year old African-American male diagnosed with stage IV ([place],  N3, M1C) non-small cell lung cancer, poorly differentiated adenocarcinoma. He presented with bulky mediastinal as well as right hilar, supraclavicular, abdominal, and right axillary lymphadenopathy in addition to metastatic disease to the liver. He was diagnosed in January 2021. The patient does not have any actionable mutations. The patient was not a candidate for treatment with targeted therapy or enrollment in a clinical trial with the [facility] regimen.   The patient is currently undergoing systemic chemotherapy with carboplatin for an AUC of 5, Alimta 500 mg per metered squared, Keytruda 200 mg IV every 3 weeks. He is status post  27 cycles. Starting from cycle #5 patient has been on maintenance Alimta and Keytruda IV every 3 weeks. Starting from cycle #12 he will be on single agent treatment with Keytruda secondary to renal insufficiency.  Labs were reviewed. His labs show his baseline CKD. Recommend that he proceed  with treatment today as scheduled.   We will see him back for a follow up visit in 3 weeks for evaluation before starting cycle #29.   Advised him to take his lactulose as prescribed to avoid taking too much. Otherwise, he uses stool softener and laxatives if needed. His last bowel movement was this morning and was normal color and consistency. Also discussed drinking plenty of fluids, eating fruits/veggies, and being active helps reduce the risk of constipation.   He will follow with ENT for the mild dysphagia.   The patient was advised to call immediately if he has any concerning symptoms in the interval. The patient voices understanding of current disease status and treatment options and is in agreement with the current care plan. All questions were answered. The patient knows to call the clinic with any problems, questions or concerns. We can certainly see the patient much sooner if necessary   Orders Placed This Encounter  Procedures   CBC with Differential ([facility] Only)    Standing Status:   Standing    Number of Occurrences:   12    Standing Expiration Date:   02/01/2022   TSH    Standing Status:   Standing    Number of Occurrences:   10    Standing Expiration Date:   02/01/2022     The total time spent in the appointment was 20-29 minutes  Grizelda Piscopo L Amillia Biffle, PA-C 02/01/21

## 2021-02-01 NOTE — Progress Notes (Signed)
Per Dr. [name] ,it is okay to treat pt today with Keytruda and creatinine of 2.14.

## 2021-02-12 ENCOUNTER — Telehealth: Payer: Self-pay

## 2021-02-12 NOTE — Telephone Encounter (Signed)
Patient called and advised of the call to schedule medicare wellness visit via telephone, he verbalized understanding. Appointment scheduled for Thursday 02/15/21 at 1800.

## 2021-02-15 ENCOUNTER — Ambulatory Visit (INDEPENDENT_AMBULATORY_CARE_PROVIDER_SITE_OTHER): Payer: Medicare Other

## 2021-02-15 ENCOUNTER — Other Ambulatory Visit: Payer: Self-pay

## 2021-02-15 ENCOUNTER — Encounter: Payer: Self-pay | Admitting: Internal Medicine

## 2021-02-15 DIAGNOSIS — Z0000 Encounter for general adult medical examination without abnormal findings: Secondary | ICD-10-CM

## 2021-02-15 NOTE — Progress Notes (Signed)
Subjective:   Kirk [name] is a 65 y.o. male who presents for an Initial Medicare Annual Wellness Visit.  I connected with  [name] on 02/15/21 by a audio enabled telemedicine application and verified that I am speaking with the correct person using two identifiers.   I discussed the limitations of evaluation and management by telemedicine. The patient expressed understanding and agreed to proceed.   Location of Patient: Home Location of Provider: Home offie  Persons participating in the visit: Kirk [name] (Patient) and Janan Ridge, RN  Review of Systems    Defer to PCP Cardiac Risk Factors include: advanced age (>87men, >70 women);diabetes mellitus;dyslipidemia;hypertension;male gender     Objective:    Today's Vitals   02/15/21 1819  PainSc: 0-No pain   There is no height or weight on file to calculate BMI.  Advanced Directives 02/15/2021 01/11/2021 01/08/2021 12/21/2020 11/29/2020 11/09/2020 10/18/2020  Does Patient Have a Medical Advance Directive? No No No No No No Yes  Type of Advance Directive - - - - Press photographer;Living will - [place];Living will  Does patient want to make changes to medical advance directive? - - - - - No - Patient declined No - Patient declined  Copy of [place] in Chart? - - - - No - copy requested - No - copy requested  Would patient like information on creating a medical advance directive? Yes (MAU/Ambulatory/Procedural Areas - Information given) No - Patient declined No - Patient declined No - Patient declined No - Patient declined No - Patient declined No - Patient declined    Current Medications (verified) Outpatient Encounter Medications as of 02/15/2021  Medication Sig   acetaminophen (TYLENOL) 500 MG tablet Take 500 mg by mouth every 6 (six) hours as needed for moderate pain or fever.    aspirin EC 81 MG tablet Take 81 mg by mouth at bedtime.   carvedilol (COREG) 12.5 MG tablet Take  1 tablet (12.5 mg total) by mouth 2 (two) times daily with a meal.   cetirizine (ZYRTEC) 10 MG tablet Take 1 tablet (10 mg total) by mouth daily. (Patient not taking: Reported on 02/15/2021)   clopidogrel (PLAVIX) 75 MG tablet Take 1 tablet (75 mg total) by mouth daily.   docusate sodium (COLACE) 100 MG capsule Take 1 capsule (100 mg total) by mouth 2 (two) times daily as needed for mild constipation.   ferrous sulfate 324 MG TBEC Take 1 tablet (324 mg total) by mouth daily with breakfast. (Patient not taking: Reported on 7/62/8315)   folic acid (FOLVITE) 1 MG tablet Take 1 tablet (1 mg total) by mouth daily. (Patient not taking: Reported on 02/15/2021)   gabapentin (NEURONTIN) 300 MG capsule Take 1 capsule (300 mg total) by mouth 3 (three) times daily.   glimepiride (AMARYL) 4 MG tablet Take 1 tablet (4 mg total) by mouth daily.   Hydrocortisone Acetate 1 % CREA Apply 15 g topically 2 (two) times daily. (Patient not taking: Reported on 02/15/2021)   lactulose (CHRONULAC) 10 GM/15ML solution Take 30 mLs (20 g total) by mouth 2 (two) times daily as needed for moderate constipation.   lidocaine-prilocaine (EMLA) cream APPLY TO THE PORT-A-CATH SITE 30-60 MINUTES BEFORE TREATMENT. (Patient taking differently: Apply 1 application topically daily as needed (access port). Apply to the Port-A-Cath site 30-60 minutes before treatment.)   lisinopril (ZESTRIL) 5 MG tablet Take 5 mg by mouth daily. (Patient not taking: Reported on 02/15/2021)   meclizine (  ANTIVERT) 25 MG tablet Take 1 tablet (25 mg total) by mouth 3 (three) times daily as needed for dizziness. (Patient not taking: Reported on 02/15/2021)   metFORMIN (GLUCOPHAGE) 1000 MG tablet Take 1 tablet (1,000 mg total) by mouth 2 (two) times daily with a meal.   multivitamin (ONE-A-DAY MEN'S) TABS tablet Take 1 tablet by mouth daily.   nitroGLYCERIN (NITROSTAT) 0.4 MG SL tablet Place 1 tablet (0.4 mg total) under the tongue every 5 (five) minutes x 3 doses as  needed for chest pain.   oxyCODONE-acetaminophen (PERCOCET/ROXICET) 5-325 MG tablet Take 1 tablet by mouth every 6 (six) hours as needed for severe pain. (Patient not taking: Reported on 02/15/2021)   pantoprazole (PROTONIX) 40 MG tablet Take 1 tablet (40 mg total) by mouth daily. (Patient not taking: Reported on 02/15/2021)   prochlorperazine (COMPAZINE) 10 MG tablet Take 1 tablet (10 mg total) by mouth every 6 (six) hours as needed for nausea or vomiting. (Patient not taking: Reported on 02/15/2021)   rosuvastatin (CRESTOR) 40 MG tablet Take 1 tablet (40 mg total) by mouth daily.   Vitamin D, Ergocalciferol, (DRISDOL) 1.25 MG (50000 UNIT) CAPS capsule Take 1 capsule (50,000 Units total) by mouth every 7 (seven) days.   No facility-administered encounter medications on file as of 02/15/2021.    Allergies (verified) Patient has no known allergies.   History: Past Medical History:  Diagnosis Date   Arthritis    "right knee" (09/04/2015)   Atrial flutter with rapid ventricular response ([place]) 04/19/2019   Dizziness 10/2019   Dyspnea    increased exertion   Elevated serum creatinine 07/2020   HTN (hypertension)    Hyperlipidemia    Lung cancer ([place]) 05/2019   Microalbuminuria 07/2020   NSTEMI (non-ST elevated myocardial infarction) ([place]) 09/04/2015   a. cath 09/05/2015: 95% stenosis mid-LAD, 55% mid-RCA, and 40% prox-RCA. PCI performed w/ Synergy DES to LAD   PAD (peripheral artery disease) ([facility])    Stenting of bilateral iliacs in 2003.   Refusal of blood transfusions as patient is Jehovah's Witness    Type II diabetes mellitus ([place])    Vitamin D deficiency 07/2020   Past Surgical History:  Procedure Laterality Date   CARDIAC CATHETERIZATION N/A 09/05/2015   Procedure: Left Heart Cath and Coronary Angiography;  Surgeon: Troy Sine, MD;  Location: [facility];  Service: Cardiovascular;  Laterality: N/A;   CARDIAC CATHETERIZATION N/A 09/05/2015   Procedure: Coronary Stent  Intervention;  Surgeon: Troy Sine, MD;  Location: [facility];  Service: Cardiovascular;  Laterality: N/A;   IR IMAGING GUIDED PORT INSERTION  07/15/2019   ORIF CONGENITAL HIP DISLOCATION Left ~ 1965   "put pins in it"   PERIPHERAL VASCULAR CATHETERIZATION Bilateral 2003   "stenting"   TONSILLECTOMY  ~ 1963   Family History  Problem Relation Age of Onset   Lymphoma Mother    Cancer - Prostate Father    CAD Neg Hx    Colon cancer Neg Hx    Rectal cancer Neg Hx    Stomach cancer Neg Hx    Esophageal cancer Neg Hx    Social History   Socioeconomic History   Marital status: Married    Spouse name: Not on file   Number of children: 1   Years of education: Not on file   Highest education level: Not on file  Occupational History   Occupation: Furniture salesman  Tobacco Use   Smoking status: Former    Packs/day:  2.00    Years: 35.00    Pack years: 70.00    Types: Cigarettes    Quit date: 02/19/2003    Years since quitting: 18.0   Smokeless tobacco: Never  Vaping Use   Vaping Use: Never used  Substance and Sexual Activity   Alcohol use: Not Currently    Alcohol/week: 0.0 standard drinks   Drug use: Not Currently    Types: Marijuana   Sexual activity: Not Currently  Other Topics Concern   Not on file  Social History Narrative   Not on file   Social Determinants of Health   Financial Resource Strain: Low Risk    Difficulty of Paying Living Expenses: Not hard at all  Food Insecurity: No Food Insecurity   Worried About Charity fundraiser in the Last Year: Never true   Ran Out of Food in the Last Year: Never true  Transportation Needs: No Transportation Needs   Lack of Transportation (Medical): No   Lack of Transportation (Non-Medical): No  Physical Activity: Sufficiently Active   Days of Exercise per Week: 5 days   Minutes of Exercise per Session: 150+ min  Stress: No Stress Concern Present   Feeling of Stress : Not at all  Social Connections:  Moderately Integrated   Frequency of Communication with Friends and Family: More than three times a week   Frequency of Social Gatherings with Friends and Family: More than three times a week   Attends Religious Services: More than 4 times per year   Active Member of [employer] or Organizations: No   Attends Music therapist: Never   Marital Status: Married    Tobacco Counseling Counseling given: Not Answered   Clinical Intake:  Pre-visit preparation completed: Yes  Pain : No/denies pain Pain Score: 0-No pain     Nutritional Risks: None Diabetes: Yes CBG done?: No  How often do you need to have someone help you when you read instructions, pamphlets, or other written materials from your doctor or pharmacy?: 1 - Never What is the last grade level you completed in school?: College graduate 4 years  Diabetic?Yes  Interpreter Needed?: No  Information entered by :: Janan Ridge, RN   Activities of Daily Living In your present state of health, do you have any difficulty performing the following activities: 02/15/2021  Hearing? N  Vision? N  Difficulty concentrating or making decisions? N  Walking or climbing stairs? N  Dressing or bathing? N  Doing errands, shopping? N  Preparing Food and eating ? N  Using the Toilet? N  In the past six months, have you accidently leaked urine? N  Do you have problems with loss of bowel control? N  Managing your Medications? N  Managing your Finances? N  Housekeeping or managing your Housekeeping? N  Some recent data might be hidden    Patient Care Team: [name], NP as PCP - General (Adult Health Nurse Practitioner) Burnell Blanks, MD as PCP - Cardiology (Cardiology) Valrie Hart, RN as Oncology Nurse Navigator  Indicate any recent Medical Services you may have received from other than [facility] providers in the past year (date may be approximate).     Assessment:   This is a routine wellness examination  for Kirk [name].  Hearing/Vision screen No results found.  Dietary issues and exercise activities discussed: Current Exercise Habits: Home exercise routine, Type of exercise: walking, Time (Minutes): 30, Frequency (Times/Week): 5, Weekly Exercise (Minutes/Week): 150, Intensity: Mild, Exercise limited by: None  identified   Goals Addressed   None   Depression Screen PHQ 2/9 Scores 04/05/2020 02/28/2020 08/09/2019 11/11/2018 11/11/2018 07/02/2018 01/05/2018  PHQ - 2 Score 0 0 0 1 0 0 0  PHQ- 9 Score - - - - - - 0  Exception Documentation Medical reason Medical reason - - - - -    Fall Risk Fall Risk  02/15/2021 12/25/2020 04/05/2020 02/28/2020 11/22/2019  Falls in the past year? 0 0 1 1 0  Number falls in past yr: 0 0 0 0 0  Injury with Fall? 1 0 1 0 0  Risk for fall due to : No Fall Risks - - No Fall Risks -    FALL RISK PREVENTION PERTAINING TO THE HOME:  Any stairs in or around the home? Yes  If so, are there any without handrails? Yes  Home free of loose throw rugs in walkways, pet beds, electrical cords, etc? No  Adequate lighting in your home to reduce risk of falls? Yes   ASSISTIVE DEVICES UTILIZED TO PREVENT FALLS:  Life alert? No  Use of a cane, walker or w/c? No, but patient has a seated walker that he uses when walking the dogs to sit on if he gets tired. He says that's the only time it's used. Grab bars in the bathroom? Yes  Shower chair or bench in shower? No  Elevated toilet seat or a handicapped toilet? No   TIMED UP AND GO:  Was the test performed?  N/A .  Length of time to ambulate 10 feet: N/A sec.    Cognitive Function:     6CIT Screen 02/15/2021  What Year? 0 points  What month? 0 points  What time? 0 points  Count back from 20 0 points  Months in reverse 0 points  Repeat phrase 2 points  Total Score 2    Immunizations Immunization History  Administered Date(s) Administered   Fluad Quad(high Dose 65+) 03/21/2020   Influenza, High Dose Seasonal PF  03/22/2019   PFIZER(Purple Top)SARS-COV-2 Vaccination 01/18/2020   Pneumococcal Conjugate-13 03/22/2019    TDAP status: Up to date  Flu Vaccine status: Due, Education has been provided regarding the importance of this vaccine. Advised may receive this vaccine at local pharmacy or Health Dept. Aware to provide a copy of the vaccination record if obtained from local pharmacy or Health Dept. Verbalized acceptance and understanding.  Pneumococcal vaccine status: Up to date  Covid-19 vaccine status: Completed vaccines  Qualifies for Shingles Vaccine? Yes   Zostavax completed No   Shingrix Completed?: No.    Education has been provided regarding the importance of this vaccine. Patient has been advised to call insurance company to determine out of pocket expense if they have not yet received this vaccine. Advised may also receive vaccine at local pharmacy or Health Dept. Verbalized acceptance and understanding.  Screening Tests Health Maintenance  Topic Date Due   Zoster Vaccines- Shingrix (1 of 2) Never done   FOOT EXAM  07/02/2017   COVID-19 Vaccine (2 - Pfizer risk series) 02/08/2020   TETANUS/TDAP  10/02/2026 (Originally 01/29/1975)   HEMOGLOBIN A1C  06/27/2021   OPHTHALMOLOGY EXAM  07/27/2021   COLONOSCOPY (Pts 45-40yrs Insurance coverage will need to be confirmed)  05/16/2022   Hepatitis C Screening  Completed   HPV VACCINES  Aged Out    Health Maintenance  Health Maintenance Due  Topic Date Due   Zoster Vaccines- Shingrix (1 of 2) Never done   FOOT EXAM  07/02/2017  COVID-19 Vaccine (2 - Pfizer risk series) 02/08/2020    Colorectal cancer screening: Type of screening: Colonoscopy. Completed 05/17/19. Repeat every 3 years  Lung Cancer Screening: (Low Dose CT Chest recommended if Age 40-80 years, 30 pack-year currently smoking OR have quit w/in 15years.) does qualify.   Lung Cancer Screening Referral: Defer to PCP  Additional Screening:  Hepatitis C Screening: does  qualify; Completed 03/19/2016  Vision Screening: Recommended annual ophthalmology exams for early detection of glaucoma and other disorders of the eye. Is the patient up to date with their annual eye exam?  Yes  Who is the provider or what is the name of the office in which the patient attends annual eye exams? [name] If pt is not established with a provider, would they like to be referred to a provider to establish care?  N/A .   Dental Screening: Recommended annual dental exams for proper oral hygiene  Community Resource Referral / Chronic Care Management: CRR required this visit?  Yes   CCM required this visit?  No      Plan:     I have personally reviewed and noted the following in the patient's chart:   Medical and social history Use of alcohol, tobacco or illicit drugs  Current medications and supplements including opioid prescriptions. Patient is not currently taking opioid prescriptions. Functional ability and status Nutritional status Physical activity Advanced directives List of other physicians Hospitalizations, surgeries, and ER visits in previous 12 months Vitals Screenings to include cognitive, depression, and falls Referrals and appointments  In addition, I have reviewed and discussed with patient certain preventive protocols, quality metrics, and best practice recommendations. A written personalized care plan for preventive services as well as general preventive health recommendations were provided to patient.     Matilde Sprang, [place]   02/15/2021   Nurse Notes: 35 minutes non face to face visit. Social Worker referral for making out a will. Will like information mailed on Medical and [place].   Mr. Kirk [name] , Thank you for taking time to come for your Medicare Wellness Visit. I appreciate your ongoing commitment to your health goals. Please review the following plan we discussed and let me know if I can assist you  in the future.   These are the goals we discussed:  Goals   None     This is a list of the screening recommended for you and due dates:  Health Maintenance  Topic Date Due   Zoster (Shingles) Vaccine (1 of 2) Never done   Complete foot exam   07/02/2017   COVID-19 Vaccine (2 - Pfizer risk series) 02/08/2020   Tetanus Vaccine  10/02/2026*   Hemoglobin A1C  06/27/2021   Eye exam for diabetics  07/27/2021   Colon Cancer Screening  05/16/2022   Hepatitis C Screening: USPSTF Recommendation to screen - Ages 18-79 yo.  Completed   HPV Vaccine  Aged Out  *Topic was postponed. The date shown is not the original due date.

## 2021-02-20 ENCOUNTER — Ambulatory Visit (INDEPENDENT_AMBULATORY_CARE_PROVIDER_SITE_OTHER): Payer: Medicare Other | Admitting: Otolaryngology

## 2021-02-20 ENCOUNTER — Other Ambulatory Visit: Payer: Self-pay

## 2021-02-20 DIAGNOSIS — R1314 Dysphagia, pharyngoesophageal phase: Secondary | ICD-10-CM

## 2021-02-20 DIAGNOSIS — I2510 Atherosclerotic heart disease of native coronary artery without angina pectoris: Secondary | ICD-10-CM

## 2021-02-20 DIAGNOSIS — K219 Gastro-esophageal reflux disease without esophagitis: Secondary | ICD-10-CM

## 2021-02-20 NOTE — Progress Notes (Signed)
HPI: Kirk [name] is a 65 y.o. male who presents is referred by his PCP for evaluation of throat concerns.  He states that a little over a month ago he was having trouble swallowing and had a CT scan performed that showed some mucosal thickening within the hypopharynx but no distinct lesions.  This occurred shortly after eating some seafood and he thinks it might have been a reaction to something versus an infection.  He is doing much better presently and swallowing better.  He is having no hoarseness or voice problems today. He has had history of reflux problems in the past and has taken antacids in the past but not taking any regularly presently..  Past Medical History:  Diagnosis Date   Arthritis    "right knee" (09/04/2015)   Atrial flutter with rapid ventricular response ([place]) 04/19/2019   Dizziness 10/2019   Dyspnea    increased exertion   Elevated serum creatinine 07/2020   HTN (hypertension)    Hyperlipidemia    Lung cancer ([facility]) 05/2019   Microalbuminuria 07/2020   NSTEMI (non-ST elevated myocardial infarction) ([facility]) 09/04/2015   a. cath 09/05/2015: 95% stenosis mid-LAD, 55% mid-RCA, and 40% prox-RCA. PCI performed w/ Synergy DES to LAD   PAD (peripheral artery disease) (H[facility])    Stenting of bilateral iliacs in 2003.   Refusal of blood transfusions as patient is Jehovah's Witness    Type II diabetes mellitus ([facility])    Vitamin D deficiency 07/2020   Past Surgical History:  Procedure Laterality Date   CARDIAC CATHETERIZATION N/A 09/05/2015   Procedure: Left Heart Cath and Coronary Angiography;  Surgeon: Troy Sine, MD;  Location: [facility];  Service: Cardiovascular;  Laterality: N/A;   CARDIAC CATHETERIZATION N/A 09/05/2015   Procedure: Coronary Stent Intervention;  Surgeon: Troy Sine, MD;  Location: [facility];  Service: Cardiovascular;  Laterality: N/A;   IR IMAGING GUIDED PORT INSERTION  07/15/2019   ORIF CONGENITAL HIP DISLOCATION Left ~ 1965   "put  pins in it"   PERIPHERAL VASCULAR CATHETERIZATION Bilateral 2003   "stenting"   TONSILLECTOMY  ~ 1963   Social History   Socioeconomic History   Marital status: Married    Spouse name: Not on file   Number of children: 1   Years of education: Not on file   Highest education level: Not on file  Occupational History   Occupation: Furniture salesman  Tobacco Use   Smoking status: Former    Packs/day: 2.00    Years: 35.00    Pack years: 70.00    Types: Cigarettes    Quit date: 02/19/2003    Years since quitting: 18.0   Smokeless tobacco: Never  Vaping Use   Vaping Use: Never used  Substance and Sexual Activity   Alcohol use: Not Currently    Alcohol/week: 0.0 standard drinks   Drug use: Not Currently    Types: Marijuana   Sexual activity: Not Currently  Other Topics Concern   Not on file  Social History Narrative   Not on file   Social Determinants of Health   Financial Resource Strain: Low Risk    Difficulty of Paying Living Expenses: Not hard at all  Food Insecurity: No Food Insecurity   Worried About Charity fundraiser in the Last Year: Never true   [place] in the Last Year: Never true  Transportation Needs: No Transportation Needs   Lack of Transportation (Medical): No   Lack of  Transportation (Non-Medical): No  Physical Activity: Sufficiently Active   Days of Exercise per Week: 5 days   Minutes of Exercise per Session: 150+ min  Stress: No Stress Concern Present   Feeling of Stress : Not at all  Social Connections: Moderately Integrated   Frequency of Communication with Friends and Family: More than three times a week   Frequency of Social Gatherings with Friends and Family: More than three times a week   Attends Religious Services: More than 4 times per year   Active Member of [employer] or Organizations: No   Attends Music therapist: Never   Marital Status: Married   Family History  Problem Relation Age of Onset   Lymphoma Mother     Cancer - Prostate Father    CAD Neg Hx    Colon cancer Neg Hx    Rectal cancer Neg Hx    Stomach cancer Neg Hx    Esophageal cancer Neg Hx    No Known Allergies Prior to Admission medications   Medication Sig Start Date End Date Taking? Authorizing Provider  acetaminophen (TYLENOL) 500 MG tablet Take 500 mg by mouth every 6 (six) hours as needed for moderate pain or fever.     [provider]  aspirin EC 81 MG tablet Take 81 mg by mouth at bedtime.    [provider]  carvedilol (COREG) 12.5 MG tablet Take 1 tablet (12.5 mg total) by mouth 2 (two) times daily with a meal. 12/25/20   [name], NP  cetirizine (ZYRTEC) 10 MG tablet Take 1 tablet (10 mg total) by mouth daily. Patient not taking: Reported on 02/15/2021 02/16/20   [name], FNP  clopidogrel (PLAVIX) 75 MG tablet Take 1 tablet (75 mg total) by mouth daily. 12/25/20   [name], NP  docusate sodium (COLACE) 100 MG capsule Take 1 capsule (100 mg total) by mouth 2 (two) times daily as needed for mild constipation. 12/25/20 12/25/21  [name], NP  ferrous sulfate 324 MG TBEC Take 1 tablet (324 mg total) by mouth daily with breakfast. Patient not taking: Reported on 02/15/2021 11/22/19   [name], FNP  folic acid (FOLVITE) 1 MG tablet Take 1 tablet (1 mg total) by mouth daily. Patient not taking: Reported on 02/15/2021 02/16/20   [name], FNP  gabapentin (NEURONTIN) 300 MG capsule Take 1 capsule (300 mg total) by mouth 3 (three) times daily. 12/25/20   [name], NP  glimepiride (AMARYL) 4 MG tablet Take 1 tablet (4 mg total) by mouth daily. 12/25/20   [name], NP  Hydrocortisone Acetate 1 % CREA Apply 15 g topically 2 (two) times daily. Patient not taking: Reported on 02/15/2021 05/10/19   [name], FNP  lactulose (CHRONULAC) 10 GM/15ML solution Take 30 mLs (20 g total) by mouth 2 (two) times daily as needed for moderate constipation. 02/17/20   [name], FNP   lidocaine-prilocaine (EMLA) cream APPLY TO THE PORT-A-CATH SITE 30-60 MINUTES BEFORE TREATMENT. Patient taking differently: Apply 1 application topically daily as needed (access port). Apply to the Port-A-Cath site 30-60 minutes before treatment. 07/28/19   Curt Bears, MD  lisinopril (ZESTRIL) 5 MG tablet Take 5 mg by mouth daily. Patient not taking: Reported on 02/15/2021 11/23/20   [provider]  meclizine (ANTIVERT) 25 MG tablet Take 1 tablet (25 mg total) by mouth 3 (three) times daily as needed for dizziness. Patient not taking: Reported on 02/15/2021  11/26/19   [name], FNP  metFORMIN (GLUCOPHAGE) 1000 MG tablet Take 1 tablet (1,000 mg total) by mouth 2 (two) times daily with a meal. 12/25/20   [name], NP  multivitamin (ONE-A-DAY MEN'S) TABS tablet Take 1 tablet by mouth daily. 11/11/18   [name], FNP  nitroGLYCERIN (NITROSTAT) 0.4 MG SL tablet Place 1 tablet (0.4 mg total) under the tongue every 5 (five) minutes x 3 doses as needed for chest pain. 08/26/17   Imogene Burn, PA-C  oxyCODONE-acetaminophen (PERCOCET/ROXICET) 5-325 MG tablet Take 1 tablet by mouth every 6 (six) hours as needed for severe pain. Patient not taking: Reported on 02/15/2021 04/11/20   Heilingoetter, Cassandra L, PA-C  pantoprazole (PROTONIX) 40 MG tablet Take 1 tablet (40 mg total) by mouth daily. Patient not taking: Reported on 02/15/2021 02/16/20   [name], FNP  prochlorperazine (COMPAZINE) 10 MG tablet Take 1 tablet (10 mg total) by mouth every 6 (six) hours as needed for nausea or vomiting. Patient not taking: Reported on 02/15/2021 02/16/20   [name], FNP  rosuvastatin (CRESTOR) 40 MG tablet Take 1 tablet (40 mg total) by mouth daily. 12/25/20   [name], NP  Vitamin D, Ergocalciferol, (DRISDOL) 1.25 MG (50000 UNIT) CAPS capsule Take 1 capsule (50,000 Units total) by mouth every 7 (seven) days. 12/21/20   [name], NP     Positive ROS: Otherwise  negative  All other systems have been reviewed and were otherwise negative with the exception of those mentioned in the HPI and as above.  Physical Exam: Constitutional: Alert, well-appearing, no acute distress Ears: External ears without lesions or tenderness. Ear canals are clear bilaterally with intact, clear TMs.  Nasal: External nose without lesions. Septum with minimal deformity and mild rhinitis.  Both middle meatus regions are clear with no evidence of mucopurulent discharge.  No polyps noted.. Clear nasal passages otherwise. Oral: Lips and gums without lesions. Tongue and palate mucosa without lesions. Posterior oropharynx clear.  Patient is status post tonsillectomy.  Indirect laryngoscopy revealed a clear base of tongue vallecula and epiglottis.  Epiglottis protruded posteriorly little bit making visualization of the vocal cords difficult. Fiberoptic laryngoscopy was performed to the left nostril.  The nasopharynx was clear.  The base of tongue vallecula epiglottis were normal.  Vocal cords were clear bilaterally with normal vocal mobility.  Both piriform sinuses were clear.  He had mild edema of the arytenoid mucosal.  The bile duct laryngoscope was passed through the upper esophageal sphincter without difficulty and the upper cervical esophagus was clear. Neck: No palpable adenopathy or masses Respiratory: Breathing comfortably  Skin: No facial/neck lesions or rash noted.  Laryngoscopy  Date/Time: 02/20/2021 2:01 PM Performed by: Rozetta Nunnery, MD Authorized by: Rozetta Nunnery, MD   Consent:    Consent obtained:  Verbal   Consent given by:  Patient Procedure details:    Indications: hoarseness, dysphagia, or aspiration     Medication:  Afrin   Instrument: flexible fiberoptic laryngoscope     Scope location: left nare   Sinus:    Left nasopharynx: normal   Mouth:    Oropharynx: normal     Vallecula: normal     Base of tongue: normal     Epiglottis: normal    Throat:    Pyriform sinus: normal     True vocal cords: normal   Comments:     On fiberoptic laryngoscopy the hypopharynx and larynx was clear today.  He had mild edema of the arytenoid mucosal but no mucosal lesions noted.  The fiberoptic laryngoscope was passed through the upper esophageal sphincter without difficulty and the upper cervical esophagus was clear.  Assessment: History of dysphagia.  Doing better presently. On fiberoptic laryngoscopy the hypopharynx and larynx was clear.  Plan: Since he is doing well presently did not recommend any specific therapy however if he develops any recurrent symptoms would recommend use of his antacid medication before dinner on a regular basis and reviewed this with him. He will follow-up as needed.   Radene Journey, MD   CC:

## 2021-02-22 ENCOUNTER — Encounter: Payer: Self-pay | Admitting: Internal Medicine

## 2021-02-22 ENCOUNTER — Inpatient Hospital Stay: Payer: Medicare Other

## 2021-02-22 ENCOUNTER — Other Ambulatory Visit: Payer: Medicare Other

## 2021-02-22 ENCOUNTER — Inpatient Hospital Stay (HOSPITAL_BASED_OUTPATIENT_CLINIC_OR_DEPARTMENT_OTHER): Payer: Medicare Other | Admitting: Internal Medicine

## 2021-02-22 ENCOUNTER — Other Ambulatory Visit: Payer: Self-pay

## 2021-02-22 VITALS — BP 141/52 | HR 97 | Temp 98.4°F | Resp 20 | Ht 75.0 in | Wt 227.7 lb

## 2021-02-22 DIAGNOSIS — C3491 Malignant neoplasm of unspecified part of right bronchus or lung: Secondary | ICD-10-CM

## 2021-02-22 DIAGNOSIS — Z5112 Encounter for antineoplastic immunotherapy: Secondary | ICD-10-CM | POA: Diagnosis not present

## 2021-02-22 DIAGNOSIS — C787 Secondary malignant neoplasm of liver and intrahepatic bile duct: Secondary | ICD-10-CM

## 2021-02-22 DIAGNOSIS — Z95828 Presence of other vascular implants and grafts: Secondary | ICD-10-CM

## 2021-02-22 DIAGNOSIS — R5383 Other fatigue: Secondary | ICD-10-CM

## 2021-02-22 DIAGNOSIS — C3490 Malignant neoplasm of unspecified part of unspecified bronchus or lung: Secondary | ICD-10-CM | POA: Diagnosis not present

## 2021-02-22 LAB — CBC WITH DIFFERENTIAL (CANCER CENTER ONLY)
Abs Immature Granulocytes: 0.01 10*3/uL (ref 0.00–0.07)
Basophils Absolute: 0.1 10*3/uL (ref 0.0–0.1)
Basophils Relative: 1 %
Eosinophils Absolute: 0.3 10*3/uL (ref 0.0–0.5)
Eosinophils Relative: 4 %
HCT: 28.3 % — ABNORMAL LOW (ref 39.0–52.0)
Hemoglobin: 8.9 g/dL — ABNORMAL LOW (ref 13.0–17.0)
Immature Granulocytes: 0 %
Lymphocytes Relative: 34 %
Lymphs Abs: 2.4 10*3/uL (ref 0.7–4.0)
MCH: 28.2 pg (ref 26.0–34.0)
MCHC: 31.4 g/dL (ref 30.0–36.0)
MCV: 89.6 fL (ref 80.0–100.0)
Monocytes Absolute: 0.7 10*3/uL (ref 0.1–1.0)
Monocytes Relative: 10 %
Neutro Abs: 3.7 10*3/uL (ref 1.7–7.7)
Neutrophils Relative %: 51 %
Platelet Count: 281 10*3/uL (ref 150–400)
RBC: 3.16 MIL/uL — ABNORMAL LOW (ref 4.22–5.81)
RDW: 14.5 % (ref 11.5–15.5)
WBC Count: 7.1 10*3/uL (ref 4.0–10.5)
nRBC: 0 % (ref 0.0–0.2)

## 2021-02-22 LAB — CMP (CANCER CENTER ONLY)
ALT: 16 U/L (ref 0–44)
AST: 21 U/L (ref 15–41)
Albumin: 3.9 g/dL (ref 3.5–5.0)
Alkaline Phosphatase: 61 U/L (ref 38–126)
Anion gap: 11 (ref 5–15)
BUN: 30 mg/dL — ABNORMAL HIGH (ref 8–23)
CO2: 21 mmol/L — ABNORMAL LOW (ref 22–32)
Calcium: 9.8 mg/dL (ref 8.9–10.3)
Chloride: 108 mmol/L (ref 98–111)
Creatinine: 2.27 mg/dL — ABNORMAL HIGH (ref 0.61–1.24)
GFR, Estimated: 31 mL/min — ABNORMAL LOW (ref >60)
Glucose, Bld: 217 mg/dL — ABNORMAL HIGH (ref 70–99)
Potassium: 4.1 mmol/L (ref 3.5–5.1)
Sodium: 140 mmol/L (ref 135–145)
Total Bilirubin: 0.3 mg/dL (ref 0.3–1.2)
Total Protein: 8.1 g/dL (ref 6.5–8.1)

## 2021-02-22 LAB — TSH: TSH: 0.707 u[IU]/mL (ref 0.320–4.118)

## 2021-02-22 MED ORDER — SODIUM CHLORIDE 0.9% FLUSH
10.0000 mL | Freq: Once | INTRAVENOUS | Status: AC
Start: 2021-02-22 — End: 2021-02-22
  Administered 2021-02-22: 10 mL

## 2021-02-22 MED ORDER — SODIUM CHLORIDE 0.9 % IV SOLN
200.0000 mg | Freq: Once | INTRAVENOUS | Status: AC
  Administered 2021-02-22: 200 mg via INTRAVENOUS
  Filled 2021-02-22: qty 8

## 2021-02-22 MED ORDER — SODIUM CHLORIDE 0.9% FLUSH
10.0000 mL | INTRAVENOUS | Status: DC | PRN
  Administered 2021-02-22: 10 mL

## 2021-02-22 MED ORDER — SODIUM CHLORIDE 0.9 % IV SOLN
Freq: Once | INTRAVENOUS | Status: AC
Start: 2021-02-22 — End: 2021-02-22

## 2021-02-22 MED ORDER — HEPARIN SOD (PORK) LOCK FLUSH 100 UNIT/ML IV SOLN
500.0000 [IU] | Freq: Once | INTRAVENOUS | Status: AC | PRN
  Administered 2021-02-22: 500 [IU]

## 2021-02-22 NOTE — Progress Notes (Signed)
Per Dr. [name] ,it is okay to treat pt today with Keytruda and creatinine of 2.27.

## 2021-02-22 NOTE — Progress Notes (Signed)
[facility] [phone]   [phone]  OFFICE PROGRESS NOTE  [name], NP [address] [name] [address]  DIAGNOSIS: Stage IV (TX, N3, M1c) non-small cell lung cancer, poorly differentiated adenocarcinoma presented with bulky mediastinal lymphadenopathy in addition to right axillary lymphadenopathy and metastatic liver lesions, abdominal lymphadenopathy diagnosed in January 2021. Molecular studies by [employer] that shows no actionable mutations.  PRIOR THERAPY: None  CURRENT THERAPY: Systemic chemotherapy with carboplatin for AUC of 5, Alimta 500 mg/M2 and Keytruda 200 mg IV every 3 weeks.  First dose July 14, 2019.  Status post 28  cycles. Starting from cycle #5 the patient will be treated with maintenance Alimta and Keytruda every 3 weeks.  Starting from cycle #12 he will be on single agent treatment with Keytruda secondary to renal insufficiency.  INTERVAL HISTORY: Kirk [name] 65 y.o. male returns to the clinic today for follow-up visit.  The patient is feeling fine today with no concerning complaints except for fatigue.  He denied having any current chest pain, shortness of breath, cough or hemoptysis.  He has some areas of dry skin on the scalp and back.  He denied having any fever or chills.  He has no nausea, vomiting, diarrhea or constipation.  He has no headache or visual changes.  He has no recent weight loss or night sweats.  The patient is here today for evaluation before starting cycle #29 of his treatment.  MEDICAL HISTORY: Past Medical History:  Diagnosis Date   Arthritis    "right knee" (09/04/2015)   Atrial flutter with rapid ventricular response ([facility]) 04/19/2019   Dizziness 10/2019   Dyspnea    increased exertion   Elevated serum creatinine 07/2020   HTN (hypertension)    Hyperlipidemia    Lung cancer ([facility]) 05/2019   Microalbuminuria 07/2020   NSTEMI (non-ST elevated myocardial infarction) ([facility])  09/04/2015   a. cath 09/05/2015: 95% stenosis mid-LAD, 55% mid-RCA, and 40% prox-RCA. PCI performed w/ Synergy DES to LAD   PAD (peripheral artery disease) ([facility])    Stenting of bilateral iliacs in 2003.   Refusal of blood transfusions as patient is Jehovah's Witness    Type II diabetes mellitus ([facility])    Vitamin D deficiency 07/2020    ALLERGIES:  has No Known Allergies.  MEDICATIONS:  Current Outpatient Medications  Medication Sig Dispense Refill   acetaminophen (TYLENOL) 500 MG tablet Take 500 mg by mouth every 6 (six) hours as needed for moderate pain or fever.      aspirin EC 81 MG tablet Take 81 mg by mouth at bedtime.     carvedilol (COREG) 12.5 MG tablet Take 1 tablet (12.5 mg total) by mouth 2 (two) times daily with a meal. 180 tablet 3   cetirizine (ZYRTEC) 10 MG tablet Take 1 tablet (10 mg total) by mouth daily. (Patient not taking: Reported on 02/15/2021) 90 tablet 3   clopidogrel (PLAVIX) 75 MG tablet Take 1 tablet (75 mg total) by mouth daily. 90 tablet 3   docusate sodium (COLACE) 100 MG capsule Take 1 capsule (100 mg total) by mouth 2 (two) times daily as needed for mild constipation. 180 capsule 3   ferrous sulfate 324 MG TBEC Take 1 tablet (324 mg total) by mouth daily with breakfast. (Patient not taking: Reported on 02/15/2021) 30 tablet 11   folic acid (FOLVITE) 1 MG tablet Take 1 tablet (1 mg total) by mouth daily. (Patient not taking:  Reported on 02/15/2021) 90 tablet 3   gabapentin (NEURONTIN) 300 MG capsule Take 1 capsule (300 mg total) by mouth 3 (three) times daily. 270 capsule 3   glimepiride (AMARYL) 4 MG tablet Take 1 tablet (4 mg total) by mouth daily. 90 tablet 3   Hydrocortisone Acetate 1 % CREA Apply 15 g topically 2 (two) times daily. (Patient not taking: Reported on 02/15/2021) 15 g 3   lactulose (CHRONULAC) 10 GM/15ML solution Take 30 mLs (20 g total) by mouth 2 (two) times daily as needed for moderate constipation. 240 mL 11   lidocaine-prilocaine (EMLA) cream  APPLY TO THE PORT-A-CATH SITE 30-60 MINUTES BEFORE TREATMENT. (Patient taking differently: Apply 1 application topically daily as needed (access port). Apply to the Port-A-Cath site 30-60 minutes before treatment.) 30 g 0   lisinopril (ZESTRIL) 5 MG tablet Take 5 mg by mouth daily. (Patient not taking: Reported on 02/15/2021)     meclizine (ANTIVERT) 25 MG tablet Take 1 tablet (25 mg total) by mouth 3 (three) times daily as needed for dizziness. (Patient not taking: Reported on 02/15/2021) 90 tablet 3   metFORMIN (GLUCOPHAGE) 1000 MG tablet Take 1 tablet (1,000 mg total) by mouth 2 (two) times daily with a meal. 180 tablet 3   multivitamin (ONE-A-DAY MEN'S) TABS tablet Take 1 tablet by mouth daily. 30 tablet 6   nitroGLYCERIN (NITROSTAT) 0.4 MG SL tablet Place 1 tablet (0.4 mg total) under the tongue every 5 (five) minutes x 3 doses as needed for chest pain. 25 tablet 2   oxyCODONE-acetaminophen (PERCOCET/ROXICET) 5-325 MG tablet Take 1 tablet by mouth every 6 (six) hours as needed for severe pain. (Patient not taking: Reported on 02/15/2021) 20 tablet 0   pantoprazole (PROTONIX) 40 MG tablet Take 1 tablet (40 mg total) by mouth daily. (Patient not taking: Reported on 02/15/2021) 90 tablet 3   prochlorperazine (COMPAZINE) 10 MG tablet Take 1 tablet (10 mg total) by mouth every 6 (six) hours as needed for nausea or vomiting. (Patient not taking: Reported on 02/15/2021) 30 tablet 11   rosuvastatin (CRESTOR) 40 MG tablet Take 1 tablet (40 mg total) by mouth daily. 90 tablet 3   Vitamin D, Ergocalciferol, (DRISDOL) 1.25 MG (50000 UNIT) CAPS capsule Take 1 capsule (50,000 Units total) by mouth every 7 (seven) days. 5 capsule 3   No current facility-administered medications for this visit.    SURGICAL HISTORY:  Past Surgical History:  Procedure Laterality Date   CARDIAC CATHETERIZATION N/A 09/05/2015   Procedure: Left Heart Cath and Coronary Angiography;  Surgeon: Troy Sine, MD;  Location: [facility];  Service: Cardiovascular;  Laterality: N/A;   CARDIAC CATHETERIZATION N/A 09/05/2015   Procedure: Coronary Stent Intervention;  Surgeon: Troy Sine, MD;  Location: [facility];  Service: Cardiovascular;  Laterality: N/A;   IR IMAGING GUIDED PORT INSERTION  07/15/2019   ORIF CONGENITAL HIP DISLOCATION Left ~ 1965   "put pins in it"   PERIPHERAL VASCULAR CATHETERIZATION Bilateral 2003   "stenting"   TONSILLECTOMY  ~ [place]:  A comprehensive review of systems was negative except for: Constitutional: positive for fatigue Integument/breast: positive for dryness and rash   PHYSICAL EXAMINATION: General appearance: alert, cooperative, appears stated age, and no distress Head: Normocephalic, without obvious abnormality, atraumatic Neck: no adenopathy, no JVD, supple, symmetrical, trachea midline, and thyroid not enlarged, symmetric, no tenderness/mass/nodules Lymph nodes: Cervical, supraclavicular, and axillary nodes normal. Resp: clear to auscultation bilaterally Back:  symmetric, no curvature. ROM normal. No CVA tenderness. Cardio: regular rate and rhythm, S1, S2 normal, no murmur, click, rub or gallop GI: soft, non-tender; bowel sounds normal; no masses,  no organomegaly Extremities: extremities normal, atraumatic, no cyanosis or edema  ECOG PERFORMANCE STATUS: 1 - Symptomatic but completely ambulatory  Blood pressure (!) 141/52, pulse 97, temperature 98.4 F (36.9 C), temperature source Tympanic, resp. rate 20, height 6\' 3"  (1.905 m), weight 227 lb 11.2 oz (103.3 kg), SpO2 100 %.  LABORATORY DATA: Lab Results  Component Value Date   WBC 7.1 02/22/2021   HGB 8.9 (L) 02/22/2021   HCT 28.3 (L) 02/22/2021   MCV 89.6 02/22/2021   PLT 281 02/22/2021      Chemistry      Component Value Date/Time   NA 140 02/22/2021 1058   NA 140 06/08/2015 0840   K 4.1 02/22/2021 1058   CL 108 02/22/2021 1058   CO2 21 (L) 02/22/2021 1058   BUN 30 (H) 02/22/2021 1058    BUN 15 06/08/2015 0840   CREATININE 2.27 (H) 02/22/2021 1058   CREATININE 1.06 10/01/2016 0855      Component Value Date/Time   CALCIUM 9.8 02/22/2021 1058   ALKPHOS 61 02/22/2021 1058   AST 21 02/22/2021 1058   ALT 16 02/22/2021 1058   BILITOT 0.3 02/22/2021 1058       RADIOGRAPHIC STUDIES: No results found.  ASSESSMENT AND PLAN: This is a very pleasant 65 years old African-American male recently diagnosed with a stage IV (TX, N3, M1c) non-small cell lung cancer, poorly differentiated adenocarcinoma presented with bulky mediastinal as well as right hilar, supraclavicular, abdominal and right axillary lymphadenopathy in addition to metastatic liver lesions diagnosed in January 2021. Molecular studies by [employer] shows no actionable mutations.  The patient is not a candidate for treatment with targeted therapy or enrollment in the clinical trial with the [facility] regimen. The patient is currently undergoing systemic chemotherapy with carboplatin for AUC of 5, Alimta 500 mg/M2 and Keytruda 200 mg IV every 3 weeks status post 28 cycles. Starting from cycle #5 he is treated with maintenance Alimta and Keytruda every 3 weeks.  Starting from cycle #12 the patient has been on treatment with single agent Keytruda because of the renal insufficiency. The patient continues to tolerate his treatment well except for fatigue. I recommended for him to proceed with cycle #29 today as planned. For the anemia of chronic disease, I advised him to resume his oral iron tablets.  We will continue to monitor his hemoglobin and hematocrit closely. For the renal insufficiency he is followed by nephrology with Dr. Joelyn Oms. The patient will come back for follow-up visit in 3 weeks for evaluation before the next cycle of his treatment. He was advised to call immediately if he has any concerning symptoms in the interval. The patient voices understanding of current disease status and treatment options and is in  agreement with the current care plan.  All questions were answered. The patient knows to call the clinic with any problems, questions or concerns. We can certainly see the patient much sooner if necessary.  Disclaimer: This note was dictated with voice recognition software. Similar sounding words can inadvertently be transcribed and may not be corrected upon review.

## 2021-02-22 NOTE — Patient Instructions (Signed)
[facility]   Discharge Instructions: Thank you for choosing [facility] to provide your oncology and hematology care.   If you have a lab appointment with the [facility], please go directly to the [facility] and check in at the registration area.   Wear comfortable clothing and clothing appropriate for easy access to any Portacath or PICC line.   We strive to give you quality time with your provider. You may need to reschedule your appointment if you arrive late (15 or more minutes).  Arriving late affects you and other patients whose appointments are after yours.  Also, if you miss three or more appointments without notifying the office, you may be dismissed from the clinic at the provider's discretion.      For prescription refill requests, have your pharmacy contact our office and allow 72 hours for refills to be completed.    Today you received the following chemotherapy and/or immunotherapy agents: Pembrolizumab Beryle Flock)      To help prevent nausea and vomiting after your treatment, we encourage you to take your nausea medication as directed.  BELOW ARE SYMPTOMS THAT SHOULD BE REPORTED IMMEDIATELY: *FEVER GREATER THAN 100.4 F (38 C) OR HIGHER *CHILLS OR SWEATING *NAUSEA AND VOMITING THAT IS NOT CONTROLLED WITH YOUR NAUSEA MEDICATION *UNUSUAL SHORTNESS OF BREATH *UNUSUAL BRUISING OR BLEEDING *URINARY PROBLEMS (pain or burning when urinating, or frequent urination) *BOWEL PROBLEMS (unusual diarrhea, constipation, pain near the anus) TENDERNESS IN MOUTH AND THROAT WITH OR WITHOUT PRESENCE OF ULCERS (sore throat, sores in mouth, or a toothache) UNUSUAL RASH, SWELLING OR PAIN  UNUSUAL VAGINAL DISCHARGE OR ITCHING   Items with * indicate a potential emergency and should be followed up as soon as possible or go to the Emergency Department if any problems should occur.  Please show the CHEMOTHERAPY ALERT CARD or IMMUNOTHERAPY ALERT  CARD at check-in to the Emergency Department and triage nurse.  Should you have questions after your visit or need to cancel or reschedule your appointment, please contact [facility]  Dept: [phone]  and follow the prompts.  Office hours are 8:00 a.m. to 4:30 p.m. Monday - Friday. Please note that voicemails left after 4:00 p.m. may not be returned until the following business day.  We are closed weekends and major holidays. You have access to a nurse at all times for urgent questions. Please call the main number to the clinic Dept: [phone] and follow the prompts.   For any non-urgent questions, you may also contact your provider using MyChart. We now offer e-Visits for anyone 4 and older to request care online for non-urgent symptoms. For details visit [email or web address].   Also download the MyChart app! Go to the app store, search "MyChart", open the app, select [facility], and log in with your MyChart username and password.  Due to Covid, a mask is required upon entering the hospital/clinic. If you do not have a mask, one will be given to you upon arrival. For doctor visits, patients may have 1 support person aged 7 or older with them. For treatment visits, patients cannot have anyone with them due to current Covid guidelines and our immunocompromised population.

## 2021-02-23 ENCOUNTER — Telehealth: Payer: Self-pay | Admitting: Internal Medicine

## 2021-02-23 NOTE — Telephone Encounter (Signed)
Scheduled appt per 9/29 los - patient to get an updated schedule next visit.

## 2021-02-28 ENCOUNTER — Ambulatory Visit: Payer: Medicare Other | Admitting: Nurse Practitioner

## 2021-02-28 ENCOUNTER — Ambulatory Visit (INDEPENDENT_AMBULATORY_CARE_PROVIDER_SITE_OTHER): Payer: Medicare Other | Admitting: Nurse Practitioner

## 2021-02-28 ENCOUNTER — Other Ambulatory Visit: Payer: Self-pay

## 2021-02-28 ENCOUNTER — Encounter: Payer: Self-pay | Admitting: Nurse Practitioner

## 2021-02-28 VITALS — BP 137/56 | HR 81 | Temp 97.6°F | Ht 75.0 in | Wt 226.2 lb

## 2021-02-28 DIAGNOSIS — C787 Secondary malignant neoplasm of liver and intrahepatic bile duct: Secondary | ICD-10-CM

## 2021-02-28 DIAGNOSIS — C3490 Malignant neoplasm of unspecified part of unspecified bronchus or lung: Secondary | ICD-10-CM | POA: Diagnosis not present

## 2021-02-28 DIAGNOSIS — E1151 Type 2 diabetes mellitus with diabetic peripheral angiopathy without gangrene: Secondary | ICD-10-CM | POA: Diagnosis not present

## 2021-02-28 LAB — POCT GLYCOSYLATED HEMOGLOBIN (HGB A1C)
HbA1c POC (<> result, manual entry): 7.2 % (ref 4.0–5.6)
HbA1c, POC (controlled diabetic range): 7.2 % — AB (ref 0.0–7.0)
HbA1c, POC (prediabetic range): 7.2 % — AB (ref 5.7–6.4)
Hemoglobin A1C: 7.2 % — AB (ref 4.0–5.6)

## 2021-02-28 LAB — POCT URINALYSIS DIP (CLINITEK)
Bilirubin, UA: NEGATIVE
Glucose, UA: 100 mg/dL — AB
Ketones, POC UA: NEGATIVE mg/dL
Leukocytes, UA: NEGATIVE
Nitrite, UA: NEGATIVE
POC PROTEIN,UA: >=300 — AB
Spec Grav, UA: >=1.03 — AB (ref 1.010–1.025)
Urobilinogen, UA: 0.2 E.U./dL
pH, UA: 5.5 (ref 5.0–8.0)

## 2021-02-28 MED ORDER — BLOOD GLUCOSE METER KIT: PACK | 0 refills | Status: DC

## 2021-02-28 NOTE — Patient Instructions (Signed)
Diabetes Mellitus and Nutrition, Adult When you have diabetes, or diabetes mellitus, it is very important to have healthy eating habits because your blood sugar (glucose) levels are greatly affected by what you eat and drink. Eating healthy foods in the right amounts, at about the same times every day, can help you:  Control your blood glucose.  Lower your risk of heart disease.  Improve your blood pressure.  Reach or maintain a healthy weight. What can affect my meal plan? Every person with diabetes is different, and each person has different needs for a meal plan. Your health care provider may recommend that you work with a dietitian to make a meal plan that is best for you. Your meal plan may vary depending on factors such as:  The calories you need.  The medicines you take.  Your weight.  Your blood glucose, blood pressure, and cholesterol levels.  Your activity level.  Other health conditions you have, such as heart or kidney disease. How do carbohydrates affect me? Carbohydrates, also called carbs, affect your blood glucose level more than any other type of food. Eating carbs naturally raises the amount of glucose in your blood. Carb counting is a method for keeping track of how many carbs you eat. Counting carbs is important to keep your blood glucose at a healthy level, especially if you use insulin or take certain oral diabetes medicines. It is important to know how many carbs you can safely have in each meal. This is different for every person. Your dietitian can help you calculate how many carbs you should have at each meal and for each snack. How does alcohol affect me? Alcohol can cause a sudden decrease in blood glucose (hypoglycemia), especially if you use insulin or take certain oral diabetes medicines. Hypoglycemia can be a life-threatening condition. Symptoms of hypoglycemia, such as sleepiness, dizziness, and confusion, are similar to symptoms of having too much  alcohol.  Do not drink alcohol if: ? Your health care provider tells you not to drink. ? You are pregnant, may be pregnant, or are planning to become pregnant.  If you drink alcohol: ? Do not drink on an empty stomach. ? Limit how much you use to:  0-1 drink a day for women.  0-2 drinks a day for men. ? Be aware of how much alcohol is in your drink. In the [place] one drink equals one 12 oz bottle of beer (355 mL), one 5 oz glass of wine (148 mL), or one 1 oz glass of hard liquor (44 mL). ? Keep yourself hydrated with water, diet soda, or unsweetened iced tea.  Keep in mind that regular soda, juice, and other mixers may contain a lot of sugar and must be counted as carbs. What are tips for following this plan? Reading food labels  Start by checking the serving size on the "Nutrition Facts" label of packaged foods and drinks. The amount of calories, carbs, fats, and other nutrients listed on the label is based on one serving of the item. Many items contain more than one serving per package.  Check the total grams (g) of carbs in one serving. You can calculate the number of servings of carbs in one serving by dividing the total carbs by 15. For example, if a food has 30 g of total carbs per serving, it would be equal to 2 servings of carbs.  Check the number of grams (g) of saturated fats and trans fats in one serving. Choose foods that have   a low amount or none of these fats.  Check the number of milligrams (mg) of salt (sodium) in one serving. Most people should limit total sodium intake to less than 2,300 mg per day.  Always check the nutrition information of foods labeled as "low-fat" or "nonfat." These foods may be higher in added sugar or refined carbs and should be avoided.  Talk to your dietitian to identify your daily goals for nutrients listed on the label. Shopping  Avoid buying canned, pre-made, or processed foods. These foods tend to be high in fat, sodium, and added  sugar.  Shop around the outside edge of the grocery store. This is where you will most often find fresh fruits and vegetables, bulk grains, fresh meats, and fresh dairy. Cooking  Use low-heat cooking methods, such as baking, instead of high-heat cooking methods like deep frying.  Cook using healthy oils, such as olive, canola, or sunflower oil.  Avoid cooking with butter, cream, or high-fat meats. Meal planning  Eat meals and snacks regularly, preferably at the same times every day. Avoid going long periods of time without eating.  Eat foods that are high in fiber, such as fresh fruits, vegetables, beans, and whole grains. Talk with your dietitian about how many servings of carbs you can eat at each meal.  Eat 4-6 oz (112-168 g) of lean protein each day, such as lean meat, chicken, fish, eggs, or tofu. One ounce (oz) of lean protein is equal to: ? 1 oz (28 g) of meat, chicken, or fish. ? 1 egg. ?  cup (62 g) of tofu.  Eat some foods each day that contain healthy fats, such as avocado, nuts, seeds, and fish.   What foods should I eat? Fruits Berries. Apples. Oranges. Peaches. Apricots. Plums. Grapes. Mango. Papaya. Pomegranate. Kiwi. Cherries. Vegetables Lettuce. Spinach. Leafy greens, including kale, chard, collard greens, and mustard greens. Beets. Cauliflower. Cabbage. Broccoli. Carrots. Green beans. Tomatoes. Peppers. Onions. Cucumbers. Brussels sprouts. Grains Whole grains, such as whole-wheat or whole-grain bread, crackers, tortillas, cereal, and pasta. Unsweetened oatmeal. Quinoa. Brown or wild rice. Meats and other proteins Seafood. Poultry without skin. Lean cuts of poultry and beef. Tofu. Nuts. Seeds. Dairy Low-fat or fat-free dairy products such as milk, yogurt, and cheese. The items listed above may not be a complete list of foods and beverages you can eat. Contact a dietitian for more information. What foods should I avoid? Fruits Fruits canned with  syrup. Vegetables Canned vegetables. Frozen vegetables with butter or cream sauce. Grains Refined white flour and flour products such as bread, pasta, snack foods, and cereals. Avoid all processed foods. Meats and other proteins Fatty cuts of meat. Poultry with skin. Breaded or fried meats. Processed meat. Avoid saturated fats. Dairy Full-fat yogurt, cheese, or milk. Beverages Sweetened drinks, such as soda or iced tea. The items listed above may not be a complete list of foods and beverages you should avoid. Contact a dietitian for more information. Questions to ask a health care provider  Do I need to meet with a diabetes educator?  Do I need to meet with a dietitian?  What number can I call if I have questions?  When are the best times to check my blood glucose? Where to find more information:  American Diabetes Association: diabetes.org  Academy of Nutrition and Dietetics: www.eatright.org  National Institute of Diabetes and Digestive and Kidney Diseases: www.niddk.nih.gov  Association of Diabetes Care and Education Specialists: www.diabeteseducator.org Summary  It is important to have healthy eating   habits because your blood sugar (glucose) levels are greatly affected by what you eat and drink.  A healthy meal plan will help you control your blood glucose and maintain a healthy lifestyle.  Your health care provider may recommend that you work with a dietitian to make a meal plan that is best for you.  Keep in mind that carbohydrates (carbs) and alcohol have immediate effects on your blood glucose levels. It is important to count carbs and to use alcohol carefully. This information is not intended to replace advice given to you by your health care provider. Make sure you discuss any questions you have with your health care provider. Document Revised: 04/20/2019 Document Reviewed: 04/20/2019 Elsevier Patient Education  2021 Elsevier Inc.  

## 2021-02-28 NOTE — Progress Notes (Signed)
[place], [place]  [address] Phone:  [phone]   Fax:  [phone]   Established Patient Office Visit  Subjective:  Patient ID: Kirk [name], male    DOB: 1956/02/21  Age: 65 y.o. MRN: [id number]  CC:  Chief Complaint  Patient presents with   Follow-up    Pt is here today for a follow up visit for Diabetes. Pt states he has no concerns today.    HPI UNNAMED ZEIEN presents for follow up. He  has a past medical history of Arthritis, Atrial flutter with rapid ventricular response ([facility]) (04/19/2019), Dizziness (10/2019), Dyspnea, Elevated serum creatinine (07/2020), HTN (hypertension), Hyperlipidemia, Lung cancer ([place]) (05/2019), Microalbuminuria (07/2020), NSTEMI (non-ST elevated myocardial infarction) ([facility]) (09/04/2015), PAD (peripheral artery disease) ([place]), Refusal of blood transfusions as patient is Jehovah's Witness, Type II diabetes mellitus ([place]), and Vitamin D deficiency (07/2020).   He is following up for his diabetes.  He is currently on glimepiride mepriam 4 mg daily.  He does not monitor his blood glucose.  His A1c has been stable at 7% or less.  He reports that nephrology recommends discontinuing metformin and lisinopril.  He is also being treated for hypertension.  He is followed by cardiology due to his previous cardiac history.  Denies headache, dizziness, visual changes, shortness of breath, dyspnea on exertion, chest pain, nausea, vomiting or any edema.   He denies any additional problems with his throat. He felt like this maybe have been related to an allergic reaction to some seafood.  He continues to eat seafood and has not had any difficulty.   Past Medical History:  Diagnosis Date   Arthritis    "right knee" (09/04/2015)   Atrial flutter with rapid ventricular response ([place]) 04/19/2019   Dizziness 10/2019   Dyspnea    increased exertion   Elevated serum creatinine 07/2020   HTN (hypertension)     Hyperlipidemia    Lung cancer ([facility]) 05/2019   Microalbuminuria 07/2020   NSTEMI (non-ST elevated myocardial infarction) ([facility]) 09/04/2015   a. cath 09/05/2015: 95% stenosis mid-LAD, 55% mid-RCA, and 40% prox-RCA. PCI performed w/ Synergy DES to LAD   PAD (peripheral artery disease) ([facility])    Stenting of bilateral iliacs in 2003.   Refusal of blood transfusions as patient is Jehovah's Witness    Type II diabetes mellitus ([place])    Vitamin D deficiency 07/2020    Past Surgical History:  Procedure Laterality Date   CARDIAC CATHETERIZATION N/A 09/05/2015   Procedure: Left Heart Cath and Coronary Angiography;  Surgeon: Troy Sine, MD;  Location: [facility];  Service: Cardiovascular;  Laterality: N/A;   CARDIAC CATHETERIZATION N/A 09/05/2015   Procedure: Coronary Stent Intervention;  Surgeon: Troy Sine, MD;  Location: [facility];  Service: Cardiovascular;  Laterality: N/A;   IR IMAGING GUIDED PORT INSERTION  07/15/2019   ORIF CONGENITAL HIP DISLOCATION Left ~ 1965   "put pins in it"   PERIPHERAL VASCULAR CATHETERIZATION Bilateral 2003   "stenting"   TONSILLECTOMY  ~ 1963    Family History  Problem Relation Age of Onset   Lymphoma Mother    Cancer - Prostate Father    CAD Neg Hx    Colon cancer Neg Hx    Rectal cancer Neg Hx    Stomach cancer Neg Hx    Esophageal cancer Neg Hx     Social History   Socioeconomic History   Marital status: Married  Spouse name: Not on file   Number of children: 1   Years of education: Not on file   Highest education level: Not on file  Occupational History   Occupation: Furniture salesman  Tobacco Use   Smoking status: Former    Packs/day: 2.00    Years: 35.00    Pack years: 70.00    Types: Cigarettes    Quit date: 02/19/2003    Years since quitting: 18.0   Smokeless tobacco: Never  Vaping Use   Vaping Use: Never used  Substance and Sexual Activity   Alcohol use: Not Currently    Alcohol/week: 0.0 standard drinks    Drug use: Not Currently    Types: Marijuana   Sexual activity: Not Currently  Other Topics Concern   Not on file  Social History Narrative   Not on file   Social Determinants of Health   Financial Resource Strain: Low Risk    Difficulty of Paying Living Expenses: Not hard at all  Food Insecurity: No Food Insecurity   Worried About Charity fundraiser in the Last Year: Never true   [place] in the Last Year: Never true  Transportation Needs: No Transportation Needs   Lack of Transportation (Medical): No   Lack of Transportation (Non-Medical): No  Physical Activity: Sufficiently Active   Days of Exercise per Week: 5 days   Minutes of Exercise per Session: 150+ min  Stress: No Stress Concern Present   Feeling of Stress : Not at all  Social Connections: Moderately Integrated   Frequency of Communication with Friends and Family: More than three times a week   Frequency of Social Gatherings with Friends and Family: More than three times a week   Attends Religious Services: More than 4 times per year   Active Member of [employer] or Organizations: No   Attends Archivist Meetings: Never   Marital Status: Married  Human resources officer Violence: Not At Risk   Fear of Current or Ex-Partner: No   Emotionally Abused: No   Physically Abused: No   Sexually Abused: No    Outpatient Medications Prior to Visit  Medication Sig Dispense Refill   acetaminophen (TYLENOL) 500 MG tablet Take 500 mg by mouth every 6 (six) hours as needed for moderate pain or fever.      aspirin EC 81 MG tablet Take 81 mg by mouth at bedtime.     carvedilol (COREG) 12.5 MG tablet Take 1 tablet (12.5 mg total) by mouth 2 (two) times daily with a meal. 180 tablet 3   clopidogrel (PLAVIX) 75 MG tablet Take 1 tablet (75 mg total) by mouth daily. 90 tablet 3   docusate sodium (COLACE) 100 MG capsule Take 1 capsule (100 mg total) by mouth 2 (two) times daily as needed for mild constipation. 180 capsule 3    gabapentin (NEURONTIN) 300 MG capsule Take 1 capsule (300 mg total) by mouth 3 (three) times daily. 270 capsule 3   glimepiride (AMARYL) 4 MG tablet Take 1 tablet (4 mg total) by mouth daily. 90 tablet 3   lactulose (CHRONULAC) 10 GM/15ML solution Take 30 mLs (20 g total) by mouth 2 (two) times daily as needed for moderate constipation. 240 mL 11   lidocaine-prilocaine (EMLA) cream APPLY TO THE PORT-A-CATH SITE 30-60 MINUTES BEFORE TREATMENT. (Patient taking differently: Apply 1 application topically daily as needed (access port). Apply to the Port-A-Cath site 30-60 minutes before treatment.) 30 g 0   multivitamin (ONE-A-DAY MEN'S) TABS tablet  Take 1 tablet by mouth daily. 30 tablet 6   nitroGLYCERIN (NITROSTAT) 0.4 MG SL tablet Place 1 tablet (0.4 mg total) under the tongue every 5 (five) minutes x 3 doses as needed for chest pain. 25 tablet 2   rosuvastatin (CRESTOR) 40 MG tablet Take 1 tablet (40 mg total) by mouth daily. 90 tablet 3   metFORMIN (GLUCOPHAGE) 1000 MG tablet Take 1 tablet (1,000 mg total) by mouth 2 (two) times daily with a meal. 180 tablet 3   Vitamin D, Ergocalciferol, (DRISDOL) 1.25 MG (50000 UNIT) CAPS capsule Take 1 capsule (50,000 Units total) by mouth every 7 (seven) days. 5 capsule 3   cetirizine (ZYRTEC) 10 MG tablet Take 1 tablet (10 mg total) by mouth daily. (Patient not taking: No sig reported) 90 tablet 3   ferrous sulfate 324 MG TBEC Take 1 tablet (324 mg total) by mouth daily with breakfast. (Patient not taking: No sig reported) 30 tablet 11   folic acid (FOLVITE) 1 MG tablet Take 1 tablet (1 mg total) by mouth daily. (Patient not taking: No sig reported) 90 tablet 3   Hydrocortisone Acetate 1 % CREA Apply 15 g topically 2 (two) times daily. (Patient not taking: No sig reported) 15 g 3   lisinopril (ZESTRIL) 5 MG tablet Take 5 mg by mouth daily. (Patient not taking: No sig reported)     meclizine (ANTIVERT) 25 MG tablet Take 1 tablet (25 mg total) by mouth 3 (three)  times daily as needed for dizziness. (Patient not taking: No sig reported) 90 tablet 3   oxyCODONE-acetaminophen (PERCOCET/ROXICET) 5-325 MG tablet Take 1 tablet by mouth every 6 (six) hours as needed for severe pain. (Patient not taking: No sig reported) 20 tablet 0   pantoprazole (PROTONIX) 40 MG tablet Take 1 tablet (40 mg total) by mouth daily. (Patient not taking: No sig reported) 90 tablet 3   prochlorperazine (COMPAZINE) 10 MG tablet Take 1 tablet (10 mg total) by mouth every 6 (six) hours as needed for nausea or vomiting. (Patient not taking: No sig reported) 30 tablet 11   No facility-administered medications prior to visit.    No Known Allergies  ROS Review of Systems    Objective:    Physical Exam HENT:     Head: Normocephalic and atraumatic.     Nose: Nose normal.     Mouth/Throat:     Mouth: Mucous membranes are moist.  Cardiovascular:     Rate and Rhythm: Normal rate and regular rhythm.     Pulses: Normal pulses.     Heart sounds: Normal heart sounds.  Pulmonary:     Effort: Pulmonary effort is normal.     Breath sounds: Normal breath sounds.  Abdominal:     General: Bowel sounds are normal.     Palpations: Abdomen is soft.  Musculoskeletal:        General: Normal range of motion.     Cervical back: Normal range of motion.     Right lower leg: No edema.     Left lower leg: No edema.  Skin:    General: Skin is warm and dry.     Capillary Refill: Capillary refill takes less than 2 seconds.  Neurological:     General: No focal deficit present.     Mental Status: He is alert and oriented to person, place, and time.  Psychiatric:        Mood and Affect: Mood normal.        Behavior: Behavior  normal.        Thought Content: Thought content normal.        Judgment: Judgment normal.    BP (!) 137/56   Pulse 81   Temp 97.6 F (36.4 C)   Ht _0  (1.905 m)   Wt 226 lb 3.2 oz (102.6 kg)   SpO2 100%   BMI 28.27 kg/m  Wt Readings from Last 3 Encounters:   02/28/21 226 lb 3.2 oz (102.6 kg)  02/22/21 227 lb 11.2 oz (103.3 kg)  01/11/21 226 lb 1.6 oz (102.6 kg)     There are no preventive care reminders to display for this patient.   There are no preventive care reminders to display for this patient.  Lab Results  Component Value Date   TSH 0.707 02/22/2021   Lab Results  Component Value Date   WBC 7.1 02/22/2021   HGB 8.9 (L) 02/22/2021   HCT 28.3 (L) 02/22/2021   MCV 89.6 02/22/2021   PLT 281 02/22/2021   Lab Results  Component Value Date   NA 140 02/22/2021   K 4.1 02/22/2021   CO2 21 (L) 02/22/2021   GLUCOSE 217 (H) 02/22/2021   BUN 30 (H) 02/22/2021   CREATININE 2.27 (H) 02/22/2021   BILITOT 0.3 02/22/2021   ALKPHOS 61 02/22/2021   AST 21 02/22/2021   ALT 16 02/22/2021   PROT 8.1 02/22/2021   ALBUMIN 3.9 02/22/2021   CALCIUM 9.8 02/22/2021   ANIONGAP 11 02/22/2021   GFR 101.07 05/31/2019   Lab Results  Component Value Date   CHOL 117 06/28/2020   Lab Results  Component Value Date   HDL 35 (L) 06/28/2020   Lab Results  Component Value Date   LDLCALC 59 06/28/2020   Lab Results  Component Value Date   TRIG 132 06/28/2020   Lab Results  Component Value Date   CHOLHDL 3.3 06/28/2020   Lab Results  Component Value Date   HGBA1C 7.2 (A) 02/28/2021   HGBA1C 7.2 02/28/2021   HGBA1C 7.2 (A) 02/28/2021   HGBA1C 7.2 (A) 02/28/2021      Assessment & Plan:   Problem List Items Addressed This Visit       Cardiovascular and Mediastinum   Diabetes mellitus with peripheral vascular disease ([place]) - Primary Stable Current A1c 7.2% Encourage compliance with current treatment regimen  Encourage regular CBG monitoring new meter prescribed Discontinue metformin and lisinopril due to recommendations from nephrology Encourage contacting office if excessive hyperglycemia and or hypoglycemia Lifestyle modification with healthy diet (fewer calories, more high fiber foods, whole grains and non-starchy  vegetables, lower fat meat and fish, low-fat diary include healthy oils) regular exercise (physical activity) and weight loss Opthalmology exam discussed  Nutritional consult recommended Regular dental visits encouraged Home BP monitoring also encouraged goal <130/80 Continue statin therapy    Relevant Orders   POCT URINALYSIS DIP (CLINITEK) (Completed)   HgB A1c (Completed)     Respiratory   Metastatic non-small cell lung cancer ([place]) Stable Continue to follow-up with oncology and current treatment plan     Digestive   Liver metastasis ([place])    Meds ordered this encounter  Medications   blood glucose meter kit and supplies    Sig: Dispense based on patient and insurance preference. Use up to four times daily as directed. (FOR ICD-10 E10.9, E11.9).    Dispense:  1 each    Refill:  0    Order Specific Question:   Supervising Provider    Answer:  Tresa Garter [[id number]]    Order Specific Question:   Number of strips    Answer:   100    Order Specific Question:   Number of lancets    Answer:   100    Follow-up: Return in about 3 months (around 05/31/2021) for follow up DM [id number].    [name], NP

## 2021-03-01 ENCOUNTER — Ambulatory Visit: Payer: Medicare Other | Admitting: Nurse Practitioner

## 2021-03-05 NOTE — Progress Notes (Signed)
[facility] OFFICE PROGRESS NOTE  [name], NP [address] [place] [address]  DIAGNOSIS: Stage IV (TX, N3, M1c) non-small cell lung cancer, poorly differentiated adenocarcinoma presented with bulky mediastinal lymphadenopathy in addition to right axillary lymphadenopathy and metastatic liver lesions, abdominal lymphadenopathy diagnosed in January 2021. Molecular studies by [employer] that shows no actionable mutations.  PRIOR THERAPY: None  CURRENT THERAPY:  Systemic chemotherapy with carboplatin for AUC of 5, Alimta 500 mg/M2 and Keytruda 200 mg IV every 3 weeks.  First dose July 14, 2019.  Status post 29 cycles. Starting from cycle #5 the patient will be treated with maintenance Alimta and Keytruda every 3 weeks. Starting from cycle #12 he will be on single agent treatment with Keytruda secondary to renal insufficiency.  INTERVAL HISTORY: Kirk [name] 65 y.o. male returns to clinic today for a follow-up visit.  The patient is feeling fairly well today without any concerning complaints except he had a cold last week.  He is feeling "pretty good now".  He did a virtual visit with a provider and was given a prescription for doxycycline and Tessalon.  The patient reports a subjective fever but did not take his temperature.  He was tested for COVID which was negative.  Also, yesterday he saw his dentist and had a tooth extracted due to a dental abscess.    Today, de denies any fever, chills, night sweats, or appetite changes.  He reports stable dyspnea on exertion.  Denies any chest pain, hemoptysis, or residual/significant cough.  His nasal congestion has resolved for which he used humidifiers, nasal spray, and Mucinex.  Denies any nausea, vomiting, or diarrhea.  He occasionally has some difficulty with constipation but states it is "not that bad".  Denies any rashes or skin changes.  Denies any headaches or visual changes. The patient is here today for  evaluation and repeat blood work before starting cycle #30  MEDICAL HISTORY: Past Medical History:  Diagnosis Date   Arthritis    "right knee" (09/04/2015)   Atrial flutter with rapid ventricular response ([facility]) 04/19/2019   Dizziness 10/2019   Dyspnea    increased exertion   Elevated serum creatinine 07/2020   HTN (hypertension)    Hyperlipidemia    Lung cancer ([facility]) 05/2019   Microalbuminuria 07/2020   NSTEMI (non-ST elevated myocardial infarction) ([facility]) 09/04/2015   a. cath 09/05/2015: 95% stenosis mid-LAD, 55% mid-RCA, and 40% prox-RCA. PCI performed w/ Synergy DES to LAD   PAD (peripheral artery disease) ([facility])    Stenting of bilateral iliacs in 2003.   Refusal of blood transfusions as patient is Jehovah's Witness    Type II diabetes mellitus ([place])    Vitamin D deficiency 07/2020    ALLERGIES:  has No Known Allergies.  MEDICATIONS:  Current Outpatient Medications  Medication Sig Dispense Refill   acetaminophen (TYLENOL) 500 MG tablet Take 500 mg by mouth every 6 (six) hours as needed for moderate pain or fever.      aspirin EC 81 MG tablet Take 81 mg by mouth at bedtime.     benzonatate (TESSALON) 100 MG capsule Take 1 capsule (100 mg total) by mouth 3 (three) times daily as needed for cough. 30 capsule 0   blood glucose meter kit and supplies Dispense based on patient and insurance preference. Use up to four times daily as directed. (FOR ICD-10 E10.9, E11.9). 1 each 0   carvedilol (COREG) 12.5 MG tablet Take 1 tablet (12.5 mg  total) by mouth 2 (two) times daily with a meal. 180 tablet 3   clopidogrel (PLAVIX) 75 MG tablet Take 1 tablet (75 mg total) by mouth daily. 90 tablet 3   docusate sodium (COLACE) 100 MG capsule Take 1 capsule (100 mg total) by mouth 2 (two) times daily as needed for mild constipation. 180 capsule 3   doxycycline (VIBRA-TABS) 100 MG tablet Take 1 tablet (100 mg total) by mouth 2 (two) times daily. 14 tablet 0   gabapentin (NEURONTIN) 300 MG capsule Take  1 capsule (300 mg total) by mouth 3 (three) times daily. 270 capsule 3   glimepiride (AMARYL) 4 MG tablet Take 1 tablet (4 mg total) by mouth daily. 90 tablet 3   lactulose (CHRONULAC) 10 GM/15ML solution Take 30 mLs (20 g total) by mouth 2 (two) times daily as needed for moderate constipation. 240 mL 11   lidocaine-prilocaine (EMLA) cream APPLY TO THE PORT-A-CATH SITE 30-60 MINUTES BEFORE TREATMENT. (Patient taking differently: Apply 1 application topically daily as needed (access port). Apply to the Port-A-Cath site 30-60 minutes before treatment.) 30 g 0   multivitamin (ONE-A-DAY MEN'S) TABS tablet Take 1 tablet by mouth daily. 30 tablet 6   rosuvastatin (CRESTOR) 40 MG tablet Take 1 tablet (40 mg total) by mouth daily. 90 tablet 3   cetirizine (ZYRTEC) 10 MG tablet Take 1 tablet (10 mg total) by mouth daily. (Patient not taking: No sig reported) 90 tablet 3   ferrous sulfate 324 MG TBEC Take 1 tablet (324 mg total) by mouth daily with breakfast. (Patient not taking: No sig reported) 30 tablet 11   folic acid (FOLVITE) 1 MG tablet Take 1 tablet (1 mg total) by mouth daily. (Patient not taking: No sig reported) 90 tablet 3   Hydrocortisone Acetate 1 % CREA Apply 15 g topically 2 (two) times daily. (Patient not taking: No sig reported) 15 g 3   nitroGLYCERIN (NITROSTAT) 0.4 MG SL tablet Place 1 tablet (0.4 mg total) under the tongue every 5 (five) minutes x 3 doses as needed for chest pain. (Patient not taking: Reported on 03/15/2021) 25 tablet 2   No current facility-administered medications for this visit.   Facility-Administered Medications Ordered in Other Visits  Medication Dose Route Frequency Provider Last Rate Last Admin   influenza vaccine adjuvanted (FLUAD) injection 0.5 mL  0.5 mL Intramuscular Once Shelsey Rieth L, PA-C        SURGICAL HISTORY:  Past Surgical History:  Procedure Laterality Date   CARDIAC CATHETERIZATION N/A 09/05/2015   Procedure: Left Heart Cath and  Coronary Angiography;  Surgeon: Troy Sine, MD;  Location: [facility];  Service: Cardiovascular;  Laterality: N/A;   CARDIAC CATHETERIZATION N/A 09/05/2015   Procedure: Coronary Stent Intervention;  Surgeon: Troy Sine, MD;  Location: [facility];  Service: Cardiovascular;  Laterality: N/A;   IR IMAGING GUIDED PORT INSERTION  07/15/2019   ORIF CONGENITAL HIP DISLOCATION Left ~ 1965   "put pins in it"   PERIPHERAL VASCULAR CATHETERIZATION Bilateral 2003   "stenting"   TONSILLECTOMY  ~ [place]:   Constitutional: Negative for appetite change, chills, fatigue, fever and unexpected weight change.  HENT: Nasal congestion improved. Negative for mouth sores, nosebleeds, sore throat and trouble swallowing.   Eyes: Negative for eye problems and icterus.  Respiratory: Positive for baseline dyspnea on exertion. Negative for cough (resolved), hemoptysis, and wheezing.   Cardiovascular: Negative for chest pain and leg swelling.  Gastrointestinal: Positive for mild constipation. Negative for abdominal pain, constipation, diarrhea, nausea and vomiting.  Genitourinary: Negative for bladder incontinence, difficulty urinating, dysuria, frequency and hematuria.   Musculoskeletal: Negative for back pain, gait problem, neck pain and neck stiffness.  Skin: Negative for itching and rash.  Neurological: Negative for dizziness, extremity weakness, gait problem, headaches, light-headedness and seizures.  Hematological: Negative for adenopathy. Does not bruise/bleed easily.  Psychiatric/Behavioral: Negative for confusion, depression and sleep disturbance. The patient is not nervous/anxious.     PHYSICAL EXAMINATION:  Blood pressure 130/63, pulse 92, temperature (!) 96.3 F (35.7 C), temperature source Tympanic, resp. rate 20, height 6' 3"  (1.905 m), weight 224 lb 9.6 oz (101.9 kg), SpO2 100 %.  ECOG PERFORMANCE STATUS: 1  Physical Exam  Constitutional: Oriented to person,  place, and time and well-developed, well-nourished, and in no distress.  HENT:  Head: Normocephalic and atraumatic.  Mouth/Throat: Oropharynx is clear and moist. No oropharyngeal exudate.  Eyes: Conjunctivae are normal. Right eye exhibits no discharge. Left eye exhibits no discharge. No scleral icterus.  Neck: Normal range of motion. Neck supple.  Cardiovascular: Normal rate, regular rhythm, normal heart sounds and intact distal pulses.   Pulmonary/Chest: Effort normal and breath sounds normal. No respiratory distress. No wheezes. No rales.  Abdominal: Soft. Bowel sounds are normal. Exhibits no distension and no mass. There is no tenderness.  Musculoskeletal: Normal range of motion. Exhibits no edema.  Lymphadenopathy:    No cervical adenopathy.  Neurological: Alert and oriented to person, place, and time. Exhibits normal muscle tone. Gait normal. Coordination normal.  Skin: Skin is warm and dry. No rash noted. Not diaphoretic. No erythema. No pallor.  Psychiatric: Mood, memory and judgment normal.  Vitals reviewed.  LABORATORY DATA: Lab Results  Component Value Date   WBC 7.4 03/15/2021   HGB 9.1 (L) 03/15/2021   HCT 29.2 (L) 03/15/2021   MCV 86.6 03/15/2021   PLT 324 03/15/2021      Chemistry      Component Value Date/Time   NA 140 03/15/2021 0850   NA 140 06/08/2015 0840   K 4.0 03/15/2021 0850   CL 107 03/15/2021 0850   CO2 22 03/15/2021 0850   BUN 33 (H) 03/15/2021 0850   BUN 15 06/08/2015 0840   CREATININE 2.05 (H) 03/15/2021 0850   CREATININE 1.06 10/01/2016 0855      Component Value Date/Time   CALCIUM 9.6 03/15/2021 0850   ALKPHOS 65 03/15/2021 0850   AST 23 03/15/2021 0850   ALT 23 03/15/2021 0850   BILITOT 0.2 (L) 03/15/2021 0850       RADIOGRAPHIC STUDIES:  No results found.   ASSESSMENT/PLAN:  This is a very pleasant 65 year old African-American male diagnosed with stage IV (TX, N3, M1C) non-small cell lung cancer, poorly differentiated  adenocarcinoma. He presented with bulky mediastinal as well as right hilar, supraclavicular, abdominal, and right axillary lymphadenopathy in addition to metastatic disease to the liver. He was diagnosed in January 2021. The patient does not have any actionable mutations. The patient was not a candidate for treatment with targeted therapy or enrollment in a clinical trial with the [facility] regimen.  The patient is currently undergoing systemic chemotherapy with carboplatin for an AUC of 5, Alimta 500 mg per metered squared, Keytruda 200 mg IV every 3 weeks. He is status post 29 cycles. Starting from cycle #5 patient has been on maintenance Alimta and Keytruda IV every 3 weeks. Starting from cycle #12 he has been on single agent treatment  with Keytruda secondary to renal insufficiency.   Labs were reviewed. His labs show his baseline CKD. Recommend that he proceed with treatment today as scheduled.   I will arrange for a restaging CT scan prior to starting his next cycle of treatment.  I will arrange for this to be done without contrast due to his renal insufficiency.  We will see him back for a follow up visit in 3 weeks for evaluation and to review his scan results before starting cycle #31.   The patient is interested in receiving a flu shot today.  Discussed that it is okay for him to receive his flu shot.  The patient was advised to call immediately if he has any concerning symptoms in the interval. The patient voices understanding of current disease status and treatment options and is in agreement with the current care plan. All questions were answered. The patient knows to call the clinic with any problems, questions or concerns. We can certainly see the patient much sooner if necessary      Orders Placed This Encounter  Procedures   CT Chest Wo Contrast    Standing Status:   Future    Standing Expiration Date:   03/15/2022    Order Specific Question:   Preferred imaging location?     Answer:   [facility]   CT Abdomen Pelvis Wo Contrast    Standing Status:   Future    Standing Expiration Date:   03/15/2022    Order Specific Question:   Preferred imaging location?    Answer:   [facility]    Order Specific Question:   Is Oral Contrast requested for this exam?    Answer:   Yes, Per Radiology protocol   CMP ([facility] only)    Standing Status:   Standing    Number of Occurrences:   10    Standing Expiration Date:   03/15/2022      The total time spent in the appointment was 20-29 minutes.   Ima Hafner L Jarid Sasso, PA-C 03/15/21

## 2021-03-08 ENCOUNTER — Telehealth: Payer: Medicare Other | Admitting: Physician Assistant

## 2021-03-08 DIAGNOSIS — I2510 Atherosclerotic heart disease of native coronary artery without angina pectoris: Secondary | ICD-10-CM

## 2021-03-08 DIAGNOSIS — J069 Acute upper respiratory infection, unspecified: Secondary | ICD-10-CM

## 2021-03-08 DIAGNOSIS — B9689 Other specified bacterial agents as the cause of diseases classified elsewhere: Secondary | ICD-10-CM | POA: Diagnosis not present

## 2021-03-08 MED ORDER — BENZONATATE 100 MG PO CAPS: 100.0000 mg | ORAL_CAPSULE | Freq: Three times a day (TID) | ORAL | 0 refills | Status: DC | PRN

## 2021-03-08 MED ORDER — DOXYCYCLINE HYCLATE 100 MG PO TABS: 100.0000 mg | ORAL_TABLET | Freq: Two times a day (BID) | ORAL | 0 refills | Status: DC

## 2021-03-08 NOTE — Progress Notes (Signed)
Virtual Visit Consent   Kirk [name], you are scheduled for a virtual visit with a [facility] provider today.     Just as with appointments in the office, your consent must be obtained to participate.  Your consent will be active for this visit and any virtual visit you may have with one of our providers in the next 365 days.     If you have a MyChart account, a copy of this consent can be sent to you electronically.  All virtual visits are billed to your insurance company just like a traditional visit in the office.    As this is a virtual visit, video technology does not allow for your provider to perform a traditional examination.  This may limit your provider's ability to fully assess your condition.  If your provider identifies any concerns that need to be evaluated in person or the need to arrange testing (such as labs, EKG, etc.), we will make arrangements to do so.     Although advances in technology are sophisticated, we cannot ensure that it will always work on either your end or our end.  If the connection with a video visit is poor, the visit may have to be switched to a telephone visit.  With either a video or telephone visit, we are not always able to ensure that we have a secure connection.     I need to obtain your verbal consent now.   Are you willing to proceed with your visit today?    Kirk [name] has provided verbal consent on 03/08/2021 for a virtual visit (video or telephone).   Leeanne Rio, [place]   Date: 03/08/2021 6:47 PM   Virtual Visit via Video Note   I, Leeanne Rio, connected with  Kirk [name]  ([id number], 05/16/1956) on 03/08/21 at  6:30 PM EDT by a video-enabled telemedicine application and verified that I am speaking with the correct person using two identifiers.  Location: Patient: Virtual Visit Location Patient: Home Provider: Virtual Visit Location Provider: Home Office   I discussed the limitations of evaluation and management  by telemedicine and the availability of in person appointments. The patient expressed understanding and agreed to proceed.    History of Present Illness: Kirk [name] is a 65 y.o. who identifies as a male who was assigned male at birth, and is being seen today for URI  x few days. Endorses head and chest congestion with scratchy throat. Denies sinus pain, ear pain or tooth pain. Denies chest pain. Denies SOB above baseline for lung cancer. Cough is now productive. Initially with a low-grade fever that has resolved in the past 2 days. Denies recent travel or sick contact. Started taking Mucinex to help thin congestion. Also started Robitussin for cough which helps slightly. Had negative COVID test this week after symptom onset. Is worried because he has metastatic lung cancer and neutropenia.   HPI: HPI  Problems:  Patient Active Problem List   Diagnosis Date Noted   Drug-induced neutropenia ([place] [facility]) 12/26/2020   Port-A-Cath in place 12/29/2019   Metastatic non-small cell lung cancer ([facility]) 07/07/2019   Encounter for antineoplastic chemotherapy 07/07/2019   Encounter for antineoplastic immunotherapy 07/07/2019   Goals of care, counseling/discussion 06/28/2019   Adenocarcinoma of right lung, stage 4 ([facility]) 06/28/2019   Cancer associated pain 06/28/2019   CAD (coronary artery disease) 06/08/2019   Mediastinal lymphadenopathy 06/03/2019   Liver metastasis ([place] [facility]) 06/03/2019   Hemoglobin A1C between 7% and 9%  indicating borderline diabetic control ([facility]) 05/10/2019   COVID-19 virus infection 05/10/2019   Atrial flutter with rapid ventricular response ([place]) 04/19/2019   Chest pain with moderate risk for cardiac etiology 04/19/2019   Paroxysmal atrial flutter ([place]) 04/19/2019   Chest congestion 07/10/2018   Cough 07/10/2018   NSTEMI (non-ST elevated myocardial infarction) ([place]) 09/04/2015   PVD (peripheral vascular disease) ([facility]) 06/08/2015   DDD (degenerative disc disease), cervical 06/08/2015    Hyperlipidemia 06/08/2015   Diabetes mellitus with nephropathy ([place]) 06/08/2015   Diabetes mellitus with peripheral vascular disease ([place]) 06/08/2015   Annual physical exam 01/03/2015   PAD (peripheral artery disease) ([place]) 02/18/2013   Diabetes ([place]) 02/18/2013   HTN (hypertension) 02/18/2013    Allergies: No Known Allergies Medications:  Current Outpatient Medications:    benzonatate (TESSALON) 100 MG capsule, Take 1 capsule (100 mg total) by mouth 3 (three) times daily as needed for cough., Disp: 30 capsule, Rfl: 0   doxycycline (VIBRA-TABS) 100 MG tablet, Take 1 tablet (100 mg total) by mouth 2 (two) times daily., Disp: 14 tablet, Rfl: 0   acetaminophen (TYLENOL) 500 MG tablet, Take 500 mg by mouth every 6 (six) hours as needed for moderate pain or fever. , Disp: , Rfl:    aspirin EC 81 MG tablet, Take 81 mg by mouth at bedtime., Disp: , Rfl:    blood glucose meter kit and supplies, Dispense based on patient and insurance preference. Use up to four times daily as directed. (FOR ICD-10 E10.9, E11.9)., Disp: 1 each, Rfl: 0   carvedilol (COREG) 12.5 MG tablet, Take 1 tablet (12.5 mg total) by mouth 2 (two) times daily with a meal., Disp: 180 tablet, Rfl: 3   cetirizine (ZYRTEC) 10 MG tablet, Take 1 tablet (10 mg total) by mouth daily. (Patient not taking: No sig reported), Disp: 90 tablet, Rfl: 3   clopidogrel (PLAVIX) 75 MG tablet, Take 1 tablet (75 mg total) by mouth daily., Disp: 90 tablet, Rfl: 3   docusate sodium (COLACE) 100 MG capsule, Take 1 capsule (100 mg total) by mouth 2 (two) times daily as needed for mild constipation., Disp: 180 capsule, Rfl: 3   ferrous sulfate 324 MG TBEC, Take 1 tablet (324 mg total) by mouth daily with breakfast. (Patient not taking: No sig reported), Disp: 30 tablet, Rfl: 11   folic acid (FOLVITE) 1 MG tablet, Take 1 tablet (1 mg total) by mouth daily. (Patient not taking: No sig reported), Disp: 90 tablet, Rfl: 3   gabapentin (NEURONTIN) 300 MG capsule,  Take 1 capsule (300 mg total) by mouth 3 (three) times daily., Disp: 270 capsule, Rfl: 3   glimepiride (AMARYL) 4 MG tablet, Take 1 tablet (4 mg total) by mouth daily., Disp: 90 tablet, Rfl: 3   Hydrocortisone Acetate 1 % CREA, Apply 15 g topically 2 (two) times daily. (Patient not taking: No sig reported), Disp: 15 g, Rfl: 3   lactulose (CHRONULAC) 10 GM/15ML solution, Take 30 mLs (20 g total) by mouth 2 (two) times daily as needed for moderate constipation., Disp: 240 mL, Rfl: 11   lidocaine-prilocaine (EMLA) cream, APPLY TO THE PORT-A-CATH SITE 30-60 MINUTES BEFORE TREATMENT. (Patient taking differently: Apply 1 application topically daily as needed (access port). Apply to the Port-A-Cath site 30-60 minutes before treatment.), Disp: 30 g, Rfl: 0   multivitamin (ONE-A-DAY MEN'S) TABS tablet, Take 1 tablet by mouth daily., Disp: 30 tablet, Rfl: 6   nitroGLYCERIN (NITROSTAT) 0.4 MG SL tablet, Place 1 tablet (0.4 mg total) under  the tongue every 5 (five) minutes x 3 doses as needed for chest pain., Disp: 25 tablet, Rfl: 2   rosuvastatin (CRESTOR) 40 MG tablet, Take 1 tablet (40 mg total) by mouth daily., Disp: 90 tablet, Rfl: 3  Observations/Objective: Patient is well-developed, well-nourished in no acute distress.  Resting comfortably at home.  Head is normocephalic, atraumatic.  No labored breathing.  Speech is clear and coherent with logical content.  Patient is alert and oriented at baseline.   Assessment and Plan: 1. Bacterial URI - benzonatate (TESSALON) 100 MG capsule; Take 1 capsule (100 mg total) by mouth 3 (three) times daily as needed for cough.  Dispense: 30 capsule; Refill: 0 - doxycycline (VIBRA-TABS) 100 MG tablet; Take 1 tablet (100 mg total) by mouth 2 (two) times daily.  Dispense: 14 tablet; Refill: 0 Discussed very well could be viral URI giving short duration and time of year. COVID negative. Symptoms are not consistent with flu-like symptoms. Giving his lung cancer and  neutropenia, understand his concerns and giving higher risks will start antibiotics in addition to supportive measures for symptom control. Rx Doxycycline. Can continue plain Mucinex. Rx Tessalon. Continue chronic medications at present. Discussed that giving his health history there is a very low threshold for him being evaluated in person and low-threshold for ER. ER precautions reviewed.   Follow Up Instructions: I discussed the assessment and treatment plan with the patient. The patient was provided an opportunity to ask questions and all were answered. The patient agreed with the plan and demonstrated an understanding of the instructions.  A copy of instructions were sent to the patient via MyChart unless otherwise noted below.   The patient was advised to call back or seek an in-person evaluation if the symptoms worsen or if the condition fails to improve as anticipated.  Time:  I spent 15 minutes with the patient via telehealth technology discussing the above problems/concerns.    Leeanne Rio, PA-C

## 2021-03-08 NOTE — Patient Instructions (Signed)
Take antibiotic (Doxycycline) as directed.  Increase fluids.  Get plenty of rest. Use Mucinex for congestion. Tessalon for cough. Take a daily probiotic (I recommend Align or Culturelle, but even Activia Yogurt may be beneficial).  A humidifier placed in the bedroom may offer some relief for a dry, scratchy throat of nasal irritation.  Read information below on acute bronchitis. Please call or return to clinic if symptoms are not improving.  Acute Bronchitis Bronchitis is when the airways that extend from the windpipe into the lungs get red, puffy, and painful (inflamed). Bronchitis often causes thick spit (mucus) to develop. This leads to a cough. A cough is the most common symptom of bronchitis. In acute bronchitis, the condition usually begins suddenly and goes away over time (usually in 2 weeks). Smoking, allergies, and asthma can make bronchitis worse. Repeated episodes of bronchitis may cause more lung problems.  HOME CARE Rest. Drink enough fluids to keep your pee (urine) clear or pale yellow (unless you need to limit fluids as told by your doctor). Only take over-the-counter or prescription medicines as told by your doctor. Avoid smoking and secondhand smoke. These can make bronchitis worse. If you are a smoker, think about using nicotine gum or skin patches. Quitting smoking will help your lungs heal faster. Reduce the chance of getting bronchitis again by: Washing your hands often. Avoiding people with cold symptoms. Trying not to touch your hands to your mouth, nose, or eyes. Follow up with your doctor as told.  GET HELP IF: Your symptoms do not improve after 1 week of treatment. Symptoms include: Cough. Fever. Coughing up thick spit. Body aches. Chest congestion. Chills. Shortness of breath. Sore throat.  GET HELP RIGHT AWAY IF:  You have an increased fever. You have chills. You have severe shortness of breath. You have bloody thick spit (sputum). You throw up (vomit)  often. You lose too much body fluid (dehydration). You have a severe headache. You faint.  MAKE SURE YOU:  Understand these instructions. Will watch your condition. Will get help right away if you are not doing well or get worse. Document Released: 10/30/2007 Document Revised: 01/13/2013 Document Reviewed: 11/03/2012 [facility] Patient Information 2015 [place], [place]. This information is not intended to replace advice given to you by your health care provider. Make sure you discuss any questions you have with your health care provider.

## 2021-03-15 ENCOUNTER — Other Ambulatory Visit: Payer: Medicare Other

## 2021-03-15 ENCOUNTER — Inpatient Hospital Stay: Payer: Medicare Other | Attending: Internal Medicine

## 2021-03-15 ENCOUNTER — Other Ambulatory Visit: Payer: Self-pay

## 2021-03-15 ENCOUNTER — Inpatient Hospital Stay: Payer: Medicare Other

## 2021-03-15 ENCOUNTER — Inpatient Hospital Stay (HOSPITAL_BASED_OUTPATIENT_CLINIC_OR_DEPARTMENT_OTHER): Payer: Medicare Other | Admitting: Physician Assistant

## 2021-03-15 VITALS — BP 130/63 | HR 92 | Temp 96.3°F | Resp 20 | Ht 75.0 in | Wt 224.6 lb

## 2021-03-15 DIAGNOSIS — Z7984 Long term (current) use of oral hypoglycemic drugs: Secondary | ICD-10-CM | POA: Diagnosis not present

## 2021-03-15 DIAGNOSIS — Z23 Encounter for immunization: Secondary | ICD-10-CM | POA: Diagnosis not present

## 2021-03-15 DIAGNOSIS — Z5112 Encounter for antineoplastic immunotherapy: Secondary | ICD-10-CM

## 2021-03-15 DIAGNOSIS — C787 Secondary malignant neoplasm of liver and intrahepatic bile duct: Secondary | ICD-10-CM | POA: Diagnosis present

## 2021-03-15 DIAGNOSIS — R591 Generalized enlarged lymph nodes: Secondary | ICD-10-CM | POA: Diagnosis not present

## 2021-03-15 DIAGNOSIS — Z7982 Long term (current) use of aspirin: Secondary | ICD-10-CM | POA: Diagnosis not present

## 2021-03-15 DIAGNOSIS — N189 Chronic kidney disease, unspecified: Secondary | ICD-10-CM | POA: Insufficient documentation

## 2021-03-15 DIAGNOSIS — R5383 Other fatigue: Secondary | ICD-10-CM

## 2021-03-15 DIAGNOSIS — C3490 Malignant neoplasm of unspecified part of unspecified bronchus or lung: Secondary | ICD-10-CM | POA: Diagnosis present

## 2021-03-15 DIAGNOSIS — Z79899 Other long term (current) drug therapy: Secondary | ICD-10-CM | POA: Diagnosis not present

## 2021-03-15 DIAGNOSIS — C3491 Malignant neoplasm of unspecified part of right bronchus or lung: Secondary | ICD-10-CM

## 2021-03-15 DIAGNOSIS — R0609 Other forms of dyspnea: Secondary | ICD-10-CM | POA: Diagnosis not present

## 2021-03-15 DIAGNOSIS — Z95828 Presence of other vascular implants and grafts: Secondary | ICD-10-CM

## 2021-03-15 LAB — CBC WITH DIFFERENTIAL (CANCER CENTER ONLY)
Abs Immature Granulocytes: 0.02 10*3/uL (ref 0.00–0.07)
Basophils Absolute: 0 10*3/uL (ref 0.0–0.1)
Basophils Relative: 1 %
Eosinophils Absolute: 0.2 10*3/uL (ref 0.0–0.5)
Eosinophils Relative: 3 %
HCT: 29.2 % — ABNORMAL LOW (ref 39.0–52.0)
Hemoglobin: 9.1 g/dL — ABNORMAL LOW (ref 13.0–17.0)
Immature Granulocytes: 0 %
Lymphocytes Relative: 41 %
Lymphs Abs: 3.1 10*3/uL (ref 0.7–4.0)
MCH: 27 pg (ref 26.0–34.0)
MCHC: 31.2 g/dL (ref 30.0–36.0)
MCV: 86.6 fL (ref 80.0–100.0)
Monocytes Absolute: 0.5 10*3/uL (ref 0.1–1.0)
Monocytes Relative: 7 %
Neutro Abs: 3.6 10*3/uL (ref 1.7–7.7)
Neutrophils Relative %: 48 %
Platelet Count: 324 10*3/uL (ref 150–400)
RBC: 3.37 MIL/uL — ABNORMAL LOW (ref 4.22–5.81)
RDW: 14.4 % (ref 11.5–15.5)
WBC Count: 7.4 10*3/uL (ref 4.0–10.5)
nRBC: 0 % (ref 0.0–0.2)

## 2021-03-15 LAB — CMP (CANCER CENTER ONLY)
ALT: 23 U/L (ref 0–44)
AST: 23 U/L (ref 15–41)
Albumin: 3.7 g/dL (ref 3.5–5.0)
Alkaline Phosphatase: 65 U/L (ref 38–126)
Anion gap: 11 (ref 5–15)
BUN: 33 mg/dL — ABNORMAL HIGH (ref 8–23)
CO2: 22 mmol/L (ref 22–32)
Calcium: 9.6 mg/dL (ref 8.9–10.3)
Chloride: 107 mmol/L (ref 98–111)
Creatinine: 2.05 mg/dL — ABNORMAL HIGH (ref 0.61–1.24)
GFR, Estimated: 35 mL/min — ABNORMAL LOW (ref >60)
Glucose, Bld: 193 mg/dL — ABNORMAL HIGH (ref 70–99)
Potassium: 4 mmol/L (ref 3.5–5.1)
Sodium: 140 mmol/L (ref 135–145)
Total Bilirubin: 0.2 mg/dL — ABNORMAL LOW (ref 0.3–1.2)
Total Protein: 8.1 g/dL (ref 6.5–8.1)

## 2021-03-15 LAB — TSH: TSH: 0.747 u[IU]/mL (ref 0.320–4.118)

## 2021-03-15 MED ORDER — HEPARIN SOD (PORK) LOCK FLUSH 100 UNIT/ML IV SOLN
500.0000 [IU] | Freq: Once | INTRAVENOUS | Status: AC | PRN
  Administered 2021-03-15: 500 [IU]

## 2021-03-15 MED ORDER — SODIUM CHLORIDE 0.9% FLUSH
10.0000 mL | Freq: Once | INTRAVENOUS | Status: AC
Start: 2021-03-15 — End: 2021-03-15
  Administered 2021-03-15: 10 mL

## 2021-03-15 MED ORDER — SODIUM CHLORIDE 0.9 % IV SOLN: Freq: Once | INTRAVENOUS | Status: AC

## 2021-03-15 MED ORDER — SODIUM CHLORIDE 0.9 % IV SOLN
200.0000 mg | Freq: Once | INTRAVENOUS | Status: AC
Start: 2021-03-15 — End: 2021-03-15
  Administered 2021-03-15: 200 mg via INTRAVENOUS
  Filled 2021-03-15: qty 8

## 2021-03-15 MED ORDER — SODIUM CHLORIDE 0.9% FLUSH
10.0000 mL | INTRAVENOUS | Status: DC | PRN
  Administered 2021-03-15: 10 mL

## 2021-03-15 MED ORDER — INFLUENZA VAC A&B SA ADJ QUAD 0.5 ML IM PRSY
0.5000 mL | PREFILLED_SYRINGE | Freq: Once | INTRAMUSCULAR | Status: AC
  Administered 2021-03-15: 0.5 mL via INTRAMUSCULAR
  Filled 2021-03-15: qty 0.5

## 2021-03-15 NOTE — Progress Notes (Signed)
Okay to treat with elevated creat per Cassie PA

## 2021-03-15 NOTE — Patient Instructions (Signed)
[facility]  Discharge Instructions: Thank you for choosing [facility] to provide your oncology and hematology care.   If you have a lab appointment with the [facility], please go directly to the [facility] and check in at the registration area.   Wear comfortable clothing and clothing appropriate for easy access to any Portacath or PICC line.   We strive to give you quality time with your provider. You may need to reschedule your appointment if you arrive late (15 or more minutes).  Arriving late affects you and other patients whose appointments are after yours.  Also, if you miss three or more appointments without notifying the office, you may be dismissed from the clinic at the provider's discretion.      For prescription refill requests, have your pharmacy contact our office and allow 72 hours for refills to be completed.    Today you received the following chemotherapy and/or immunotherapy agents Keytruda      To help prevent nausea and vomiting after your treatment, we encourage you to take your nausea medication as directed.  BELOW ARE SYMPTOMS THAT SHOULD BE REPORTED IMMEDIATELY: *FEVER GREATER THAN 100.4 F (38 C) OR HIGHER *CHILLS OR SWEATING *NAUSEA AND VOMITING THAT IS NOT CONTROLLED WITH YOUR NAUSEA MEDICATION *UNUSUAL SHORTNESS OF BREATH *UNUSUAL BRUISING OR BLEEDING *URINARY PROBLEMS (pain or burning when urinating, or frequent urination) *BOWEL PROBLEMS (unusual diarrhea, constipation, pain near the anus) TENDERNESS IN MOUTH AND THROAT WITH OR WITHOUT PRESENCE OF ULCERS (sore throat, sores in mouth, or a toothache) UNUSUAL RASH, SWELLING OR PAIN  UNUSUAL VAGINAL DISCHARGE OR ITCHING   Items with * indicate a potential emergency and should be followed up as soon as possible or go to the Emergency Department if any problems should occur.  Please show the CHEMOTHERAPY ALERT CARD or IMMUNOTHERAPY ALERT CARD at check-in to  the Emergency Department and triage nurse.  Should you have questions after your visit or need to cancel or reschedule your appointment, please contact [facility]  Dept: [phone]  and follow the prompts.  Office hours are 8:00 a.m. to 4:30 p.m. Monday - Friday. Please note that voicemails left after 4:00 p.m. may not be returned until the following business day.  We are closed weekends and major holidays. You have access to a nurse at all times for urgent questions. Please call the main number to the clinic Dept: [phone] and follow the prompts.   For any non-urgent questions, you may also contact your provider using MyChart. We now offer e-Visits for anyone 21 and older to request care online for non-urgent symptoms. For details visit [email or web address].   Also download the MyChart app! Go to the app store, search "MyChart", open the app, select [facility], and log in with your MyChart username and password.  Due to Covid, a mask is required upon entering the hospital/clinic. If you do not have a mask, one will be given to you upon arrival. For doctor visits, patients may have 1 support person aged 68 or older with them. For treatment visits, patients cannot have anyone with them due to current Covid guidelines and our immunocompromised population.   Influenza (Flu) Vaccine (Inactivated or Recombinant): What You Need to Know 1. Why get vaccinated? Influenza vaccine can prevent influenza (flu). Flu is a contagious disease that spreads around the [place] every year, usually between October and May. Anyone can get the flu, but it is more dangerous  for some people. Infants and young children, people 76 years and older, pregnant people, and people with certain health conditions or a weakened immune system are at greatest risk of flu complications. Pneumonia, bronchitis, sinus infections, and ear infections are examples of flu-related complications. If you  have a medical condition, such as heart disease, cancer, or diabetes, flu can make it worse. Flu can cause fever and chills, sore throat, muscle aches, fatigue, cough, headache, and runny or stuffy nose. Some people may have vomiting and diarrhea, though this is more common in children than adults. In an average year, thousands of people in the [place] die from flu, and many more are hospitalized. Flu vaccine prevents millions of illnesses and flu-related visits to the doctor each year. 2. Influenza vaccines CDC recommends everyone 6 months and older get vaccinated every flu season. Children 6 months through 51 years of age may need 2 doses during a single flu season. Everyone else needs only 1 dose each flu season. It takes about 2 weeks for protection to develop after vaccination. There are many flu viruses, and they are always changing. Each year a new flu vaccine is made to protect against the influenza viruses believed to be likely to cause disease in the upcoming flu season. Even when the vaccine doesn't exactly match these viruses, it may still provide some protection. Influenza vaccine does not cause flu. Influenza vaccine may be given at the same time as other vaccines. 3. Talk with your health care provider Tell your vaccination provider if the person getting the vaccine: Has had an allergic reaction after a previous dose of influenza vaccine, or has any severe, life-threatening allergies Has ever had Guillain-Barr Syndrome (also called "GBS") In some cases, your health care provider may decide to postpone influenza vaccination until a future visit. Influenza vaccine can be administered at any time during pregnancy. People who are or will be pregnant during influenza season should receive inactivated influenza vaccine. People with minor illnesses, such as a cold, may be vaccinated. People who are moderately or severely ill should usually wait until they recover before getting influenza  vaccine. Your health care provider can give you more information. 4. Risks of a vaccine reaction Soreness, redness, and swelling where the shot is given, fever, muscle aches, and headache can happen after influenza vaccination. There may be a very small increased risk of Guillain-Barr Syndrome (GBS) after inactivated influenza vaccine (the flu shot). Young children who get the flu shot along with pneumococcal vaccine (PCV13) and/or DTaP vaccine at the same time might be slightly more likely to have a seizure caused by fever. Tell your health care provider if a child who is getting flu vaccine has ever had a seizure. People sometimes faint after medical procedures, including vaccination. Tell your provider if you feel dizzy or have vision changes or ringing in the ears. As with any medicine, there is a very remote chance of a vaccine causing a severe allergic reaction, other serious injury, or death. 5. What if there is a serious problem? An allergic reaction could occur after the vaccinated person leaves the clinic. If you see signs of a severe allergic reaction (hives, swelling of the face and throat, difficulty breathing, a fast heartbeat, dizziness, or weakness), call 9-1-1 and get the person to the nearest hospital. For other signs that concern you, call your health care provider. Adverse reactions should be reported to the Vaccine Adverse Event Reporting System (VAERS). Your health care provider will usually file this  report, or you can do it yourself. Visit the VAERS website at www.vaers.SamedayNews.es or call 301-286-7763. VAERS is only for reporting reactions, and VAERS staff members do not give medical advice. 6. The National Vaccine Injury Compensation Program The [employer] Vaccine Injury Compensation Program (VICP) is a federal program that was created to compensate people who may have been injured by certain vaccines. Claims regarding alleged injury or death due to vaccination have a time limit  for filing, which may be as short as two years. Visit the VICP website at GoldCloset.com.ee or call 907-515-3978 to learn about the program and about filing a claim. 7. How can I learn more? Ask your health care provider. Call your local or state health department. Visit the website of the Food and Drug Administration (FDA) for vaccine package inserts and additional information at TraderRating.uy. Contact the Centers for Disease Control and Prevention (CDC): Call 628-337-6972 (1-800-CDC-INFO) or Visit CDC's website at https://gibson.com/. Vaccine Information Statement Inactivated Influenza Vaccine (12/31/2019) This information is not intended to replace advice given to you by your health care provider. Make sure you discuss any questions you have with your health care provider. Document Revised: 02/17/2020 Document Reviewed: 02/17/2020 Elsevier Patient Education  2022 [employer].

## 2021-03-28 ENCOUNTER — Other Ambulatory Visit: Payer: Self-pay

## 2021-03-28 ENCOUNTER — Ambulatory Visit (INDEPENDENT_AMBULATORY_CARE_PROVIDER_SITE_OTHER): Payer: Medicare Other | Admitting: Nurse Practitioner

## 2021-03-28 VITALS — Wt 231.4 lb

## 2021-03-28 DIAGNOSIS — Z538 Procedure and treatment not carried out for other reasons: Secondary | ICD-10-CM

## 2021-04-02 ENCOUNTER — Encounter (HOSPITAL_COMMUNITY): Payer: Self-pay

## 2021-04-02 ENCOUNTER — Ambulatory Visit (HOSPITAL_COMMUNITY)
Admission: RE | Admit: 2021-04-02 | Discharge: 2021-04-02 | Disposition: A | Discharge status: Home / Self Care | Payer: Medicare Other | Source: Ambulatory Visit | Attending: Physician Assistant | Admitting: Physician Assistant

## 2021-04-02 DIAGNOSIS — C3491 Malignant neoplasm of unspecified part of right bronchus or lung: Secondary | ICD-10-CM | POA: Diagnosis present

## 2021-04-05 ENCOUNTER — Inpatient Hospital Stay (HOSPITAL_BASED_OUTPATIENT_CLINIC_OR_DEPARTMENT_OTHER): Payer: Medicare Other | Admitting: Internal Medicine

## 2021-04-05 ENCOUNTER — Encounter: Payer: Self-pay | Admitting: Internal Medicine

## 2021-04-05 ENCOUNTER — Other Ambulatory Visit: Payer: Self-pay

## 2021-04-05 ENCOUNTER — Inpatient Hospital Stay: Payer: Medicare Other

## 2021-04-05 ENCOUNTER — Inpatient Hospital Stay: Payer: Medicare Other | Attending: Internal Medicine

## 2021-04-05 VITALS — BP 147/65 | HR 88 | Temp 97.7°F | Resp 20 | Ht 75.0 in | Wt 230.0 lb

## 2021-04-05 DIAGNOSIS — Z79899 Other long term (current) drug therapy: Secondary | ICD-10-CM | POA: Diagnosis not present

## 2021-04-05 DIAGNOSIS — Z5112 Encounter for antineoplastic immunotherapy: Secondary | ICD-10-CM

## 2021-04-05 DIAGNOSIS — C3490 Malignant neoplasm of unspecified part of unspecified bronchus or lung: Secondary | ICD-10-CM | POA: Insufficient documentation

## 2021-04-05 DIAGNOSIS — C3491 Malignant neoplasm of unspecified part of right bronchus or lung: Secondary | ICD-10-CM

## 2021-04-05 DIAGNOSIS — R5383 Other fatigue: Secondary | ICD-10-CM

## 2021-04-05 DIAGNOSIS — C787 Secondary malignant neoplasm of liver and intrahepatic bile duct: Secondary | ICD-10-CM | POA: Insufficient documentation

## 2021-04-05 DIAGNOSIS — Z95828 Presence of other vascular implants and grafts: Secondary | ICD-10-CM

## 2021-04-05 LAB — CMP (CANCER CENTER ONLY)
ALT: 50 U/L — ABNORMAL HIGH (ref 0–44)
AST: 29 U/L (ref 15–41)
Albumin: 3.9 g/dL (ref 3.5–5.0)
Alkaline Phosphatase: 71 U/L (ref 38–126)
Anion gap: 9 (ref 5–15)
BUN: 24 mg/dL — ABNORMAL HIGH (ref 8–23)
CO2: 23 mmol/L (ref 22–32)
Calcium: 9.3 mg/dL (ref 8.9–10.3)
Chloride: 106 mmol/L (ref 98–111)
Creatinine: 1.6 mg/dL — ABNORMAL HIGH (ref 0.61–1.24)
GFR, Estimated: 47 mL/min — ABNORMAL LOW (ref >60)
Glucose, Bld: 185 mg/dL — ABNORMAL HIGH (ref 70–99)
Potassium: 4.3 mmol/L (ref 3.5–5.1)
Sodium: 138 mmol/L (ref 135–145)
Total Bilirubin: 0.4 mg/dL (ref 0.3–1.2)
Total Protein: 8 g/dL (ref 6.5–8.1)

## 2021-04-05 LAB — CBC WITH DIFFERENTIAL (CANCER CENTER ONLY)
Abs Immature Granulocytes: 0.01 10*3/uL (ref 0.00–0.07)
Basophils Absolute: 0.1 10*3/uL (ref 0.0–0.1)
Basophils Relative: 1 %
Eosinophils Absolute: 0.2 10*3/uL (ref 0.0–0.5)
Eosinophils Relative: 3 %
HCT: 29.5 % — ABNORMAL LOW (ref 39.0–52.0)
Hemoglobin: 9.3 g/dL — ABNORMAL LOW (ref 13.0–17.0)
Immature Granulocytes: 0 %
Lymphocytes Relative: 40 %
Lymphs Abs: 2.5 10*3/uL (ref 0.7–4.0)
MCH: 26.6 pg (ref 26.0–34.0)
MCHC: 31.5 g/dL (ref 30.0–36.0)
MCV: 84.5 fL (ref 80.0–100.0)
Monocytes Absolute: 0.8 10*3/uL (ref 0.1–1.0)
Monocytes Relative: 12 %
Neutro Abs: 2.8 10*3/uL (ref 1.7–7.7)
Neutrophils Relative %: 44 %
Platelet Count: 243 10*3/uL (ref 150–400)
RBC: 3.49 MIL/uL — ABNORMAL LOW (ref 4.22–5.81)
RDW: 14.8 % (ref 11.5–15.5)
WBC Count: 6.4 10*3/uL (ref 4.0–10.5)
nRBC: 0 % (ref 0.0–0.2)

## 2021-04-05 LAB — TSH: TSH: 1.234 u[IU]/mL (ref 0.320–4.118)

## 2021-04-05 MED ORDER — HEPARIN SOD (PORK) LOCK FLUSH 100 UNIT/ML IV SOLN: 500.0000 [IU] | Freq: Once | INTRAVENOUS | Status: DC | PRN

## 2021-04-05 MED ORDER — SODIUM CHLORIDE 0.9 % IV SOLN
200.0000 mg | Freq: Once | INTRAVENOUS | Status: AC
  Administered 2021-04-05: 200 mg via INTRAVENOUS
  Filled 2021-04-05: qty 8

## 2021-04-05 MED ORDER — SODIUM CHLORIDE 0.9% FLUSH: 10.0000 mL | INTRAVENOUS | Status: DC | PRN

## 2021-04-05 MED ORDER — SODIUM CHLORIDE 0.9 % IV SOLN: Freq: Once | INTRAVENOUS | Status: AC

## 2021-04-05 MED ORDER — SODIUM CHLORIDE 0.9% FLUSH
10.0000 mL | Freq: Once | INTRAVENOUS | Status: AC
  Administered 2021-04-05: 10 mL

## 2021-04-05 NOTE — Progress Notes (Signed)
[facility] Telephone:([phone]) [phone]   Fax:([phone]) [phone]  OFFICE PROGRESS NOTE  [name], NP [address] [name] [address]  DIAGNOSIS: Stage IV (TX, N3, M1c) non-small cell lung cancer, poorly differentiated adenocarcinoma presented with bulky mediastinal lymphadenopathy in addition to right axillary lymphadenopathy and metastatic liver lesions, abdominal lymphadenopathy diagnosed in January 2021. Molecular studies by [employer] that shows no actionable mutations.  PRIOR THERAPY: None  CURRENT THERAPY: Systemic chemotherapy with carboplatin for AUC of 5, Alimta 500 mg/M2 and Keytruda 200 mg IV every 3 weeks.  First dose July 14, 2019.  Status post 30  cycles. Starting from cycle #5 the patient will be treated with maintenance Alimta and Keytruda every 3 weeks.  Starting from cycle #12 he will be on single agent treatment with Keytruda secondary to renal insufficiency.  INTERVAL HISTORY: Kirk [name] 65 y.o. male returns to the clinic today for follow-up visit.  The patient is feeling fine today with no concerning complaints except for the baseline fatigue.  He denied having any chest pain, shortness of breath, cough or hemoptysis.  He denied having any fever or chills.  He has no nausea, vomiting, diarrhea or constipation.  He has no headache or visual changes.  The patient has no recent weight loss or night sweats.  He continues to tolerate his treatment with maintenance Keytruda fairly well.  He had repeat CT scan of the chest, abdomen pelvis performed recently and he is here for evaluation and discussion of his discuss results.  MEDICAL HISTORY: Past Medical History:  Diagnosis Date   Arthritis    "right knee" (09/04/2015)   Atrial flutter with rapid ventricular response ([facility]) 04/19/2019   Dizziness 10/2019   Dyspnea    increased exertion   Elevated serum creatinine 07/2020   HTN (hypertension)    Hyperlipidemia    Lung cancer ([facility])  05/2019   Microalbuminuria 07/2020   NSTEMI (non-ST elevated myocardial infarction) ([facility]) 09/04/2015   a. cath 09/05/2015: 95% stenosis mid-LAD, 55% mid-RCA, and 40% prox-RCA. PCI performed w/ Synergy DES to LAD   PAD (peripheral artery disease) ([facility])    Stenting of bilateral iliacs in 2003.   Refusal of blood transfusions as patient is Jehovah's Witness    Type II diabetes mellitus ([facility])    Vitamin D deficiency 07/2020    ALLERGIES:  has No Known Allergies.  MEDICATIONS:  Current Outpatient Medications  Medication Sig Dispense Refill   acetaminophen (TYLENOL) 500 MG tablet Take 500 mg by mouth every 6 (six) hours as needed for moderate pain or fever.      aspirin EC 81 MG tablet Take 81 mg by mouth at bedtime.     benzonatate (TESSALON) 100 MG capsule Take 1 capsule (100 mg total) by mouth 3 (three) times daily as needed for cough. 30 capsule 0   blood glucose meter kit and supplies Dispense based on patient and insurance preference. Use up to four times daily as directed. (FOR ICD-10 E10.9, E11.9). 1 each 0   carvedilol (COREG) 12.5 MG tablet Take 1 tablet (12.5 mg total) by mouth 2 (two) times daily with a meal. 180 tablet 3   clopidogrel (PLAVIX) 75 MG tablet Take 1 tablet (75 mg total) by mouth daily. 90 tablet 3   docusate sodium (COLACE) 100 MG capsule Take 1 capsule (100 mg total) by mouth 2 (two) times daily as needed for mild constipation. 180 capsule 3   doxycycline (VIBRA-TABS) 100  MG tablet Take 1 tablet (100 mg total) by mouth 2 (two) times daily. 14 tablet 0   gabapentin (NEURONTIN) 300 MG capsule Take 1 capsule (300 mg total) by mouth 3 (three) times daily. 270 capsule 3   glimepiride (AMARYL) 4 MG tablet Take 1 tablet (4 mg total) by mouth daily. 90 tablet 3   lactulose (CHRONULAC) 10 GM/15ML solution Take 30 mLs (20 g total) by mouth 2 (two) times daily as needed for moderate constipation. 240 mL 11   lidocaine-prilocaine (EMLA) cream APPLY TO THE PORT-A-CATH SITE 30-60  MINUTES BEFORE TREATMENT. (Patient taking differently: Apply 1 application topically daily as needed (access port). Apply to the Port-A-Cath site 30-60 minutes before treatment.) 30 g 0   multivitamin (ONE-A-DAY MEN'S) TABS tablet Take 1 tablet by mouth daily. 30 tablet 6   rosuvastatin (CRESTOR) 40 MG tablet Take 1 tablet (40 mg total) by mouth daily. 90 tablet 3   cetirizine (ZYRTEC) 10 MG tablet Take 1 tablet (10 mg total) by mouth daily. (Patient not taking: No sig reported) 90 tablet 3   ferrous sulfate 324 MG TBEC Take 1 tablet (324 mg total) by mouth daily with breakfast. (Patient not taking: No sig reported) 30 tablet 11   Hydrocortisone Acetate 1 % CREA Apply 15 g topically 2 (two) times daily. (Patient not taking: No sig reported) 15 g 3   nitroGLYCERIN (NITROSTAT) 0.4 MG SL tablet Place 1 tablet (0.4 mg total) under the tongue every 5 (five) minutes x 3 doses as needed for chest pain. (Patient not taking: No sig reported) 25 tablet 2   No current facility-administered medications for this visit.    SURGICAL HISTORY:  Past Surgical History:  Procedure Laterality Date   CARDIAC CATHETERIZATION N/A 09/05/2015   Procedure: Left Heart Cath and Coronary Angiography;  Surgeon: Troy Sine, MD;  Location: [facility];  Service: Cardiovascular;  Laterality: N/A;   CARDIAC CATHETERIZATION N/A 09/05/2015   Procedure: Coronary Stent Intervention;  Surgeon: Troy Sine, MD;  Location: [facility];  Service: Cardiovascular;  Laterality: N/A;   IR IMAGING GUIDED PORT INSERTION  07/15/2019   ORIF CONGENITAL HIP DISLOCATION Left ~ 1965   "put pins in it"   PERIPHERAL VASCULAR CATHETERIZATION Bilateral 2003   "stenting"   TONSILLECTOMY  ~ [facility]:  Constitutional: positive for fatigue Eyes: negative Ears, nose, mouth, throat, and face: negative Respiratory: negative Cardiovascular: negative Gastrointestinal:  negative Genitourinary:negative Integument/breast: negative Hematologic/lymphatic: negative Musculoskeletal:negative Neurological: negative Behavioral/Psych: negative Endocrine: negative Allergic/Immunologic: negative   PHYSICAL EXAMINATION: General appearance: alert, cooperative, appears stated age, fatigued, and no distress Head: Normocephalic, without obvious abnormality, atraumatic Neck: no adenopathy, no JVD, supple, symmetrical, trachea midline, and thyroid not enlarged, symmetric, no tenderness/mass/nodules Lymph nodes: Cervical, supraclavicular, and axillary nodes normal. Resp: clear to auscultation bilaterally Back: symmetric, no curvature. ROM normal. No CVA tenderness. Cardio: regular rate and rhythm, S1, S2 normal, no murmur, click, rub or gallop GI: soft, non-tender; bowel sounds normal; no masses,  no organomegaly Extremities: extremities normal, atraumatic, no cyanosis or edema Neurologic: Alert and oriented X 3, normal strength and tone. Normal symmetric reflexes. Normal coordination and gait  ECOG PERFORMANCE STATUS: 1 - Symptomatic but completely ambulatory  Blood pressure (!) 147/65, pulse 88, temperature 97.7 F (36.5 C), temperature source Tympanic, resp. rate 20, height 6' 3" (1.905 m), weight 230 lb (104.3 kg), SpO2 98 %.  LABORATORY DATA: Lab Results  Component Value Date  WBC 6.4 04/05/2021   HGB 9.3 (L) 04/05/2021   HCT 29.5 (L) 04/05/2021   MCV 84.5 04/05/2021   PLT 243 04/05/2021      Chemistry      Component Value Date/Time   NA 140 03/15/2021 0850   NA 140 06/08/2015 0840   K 4.0 03/15/2021 0850   CL 107 03/15/2021 0850   CO2 22 03/15/2021 0850   BUN 33 (H) 03/15/2021 0850   BUN 15 06/08/2015 0840   CREATININE 2.05 (H) 03/15/2021 0850   CREATININE 1.06 10/01/2016 0855      Component Value Date/Time   CALCIUM 9.6 03/15/2021 0850   ALKPHOS 65 03/15/2021 0850   AST 23 03/15/2021 0850   ALT 23 03/15/2021 0850   BILITOT 0.2 (L)  03/15/2021 0850       RADIOGRAPHIC STUDIES: CT Abdomen Pelvis Wo Contrast  Result Date: 04/03/2021 CLINICAL DATA:  Metastatic lung cancer, ongoing immunotherapy EXAM: CT CHEST, ABDOMEN AND PELVIS WITHOUT CONTRAST TECHNIQUE: Multidetector CT imaging of the chest, abdomen and pelvis was performed following the standard protocol without IV contrast. COMPARISON:  01/08/2021 FINDINGS: CT CHEST FINDINGS Cardiovascular: Right chest port catheter. Aortic atherosclerosis. Normal heart size. Three-vessel coronary artery calcifications. No pericardial effusion. Mediastinum/Nodes: No enlarged mediastinal, hilar, or axillary lymph nodes. Thyroid gland, trachea, and esophagus demonstrate no significant findings. Lungs/Pleura: Mild centrilobular emphysema. Mild, diffuse bilateral bronchial wall thickening. Unchanged irregular, bandlike scarring of the lateral segment right middle lobe (series 6, image 22). No pleural effusion or pneumothorax. Musculoskeletal: No chest wall mass or suspicious bone lesions identified. CT ABDOMEN PELVIS FINDINGS Hepatobiliary: Unchanged hypodense liver lesions, in particular adjacent to the gallbladder fossa, previously characterized as benign hemangiomata by contrast enhanced examination (series 2, image 71). No solid liver abnormality is seen. No gallstones, gallbladder wall thickening, or biliary dilatation. Pancreas: Unremarkable. No pancreatic ductal dilatation or surrounding inflammatory changes. Spleen: Normal in size without significant abnormality. Adrenals/Urinary Tract: Adrenal glands are unremarkable. Kidneys are normal, without renal calculi, solid lesion, or hydronephrosis. Bladder is unremarkable. Stomach/Bowel: Stomach is within normal limits. Appendix appears normal. No evidence of bowel wall thickening, distention, or inflammatory changes. Vascular/Lymphatic: Aortic atherosclerosis. Unchanged enlarged celiac axis or gastrohepatic ligament lymph node measuring 2.4 x 1.6 cm  (series 2, image 61). Reproductive: No mass or other abnormality. Other: No abdominal wall hernia or abnormality. No abdominopelvic ascites. Musculoskeletal: No acute or significant osseous findings. Status post screw fixation of the left femoral neck. IMPRESSION: 1. Unchanged enlarged celiac axis or gastrohepatic ligament lymph node. 2. No other noncontrast CT evidence of metastatic disease in the chest, abdomen, or pelvis. 3. Emphysema and mild diffuse bilateral bronchial wall thickening. 4. Coronary artery disease. Aortic Atherosclerosis (ICD10-I70.0) and Emphysema (ICD10-J43.9). Electronically Signed   By: Alex D Bibbey M.D.   On: 04/03/2021 10:10   CT Chest Wo Contrast  Result Date: 04/03/2021 CLINICAL DATA:  Metastatic lung cancer, ongoing immunotherapy EXAM: CT CHEST, ABDOMEN AND PELVIS WITHOUT CONTRAST TECHNIQUE: Multidetector CT imaging of the chest, abdomen and pelvis was performed following the standard protocol without IV contrast. COMPARISON:  01/08/2021 FINDINGS: CT CHEST FINDINGS Cardiovascular: Right chest port catheter. Aortic atherosclerosis. Normal heart size. Three-vessel coronary artery calcifications. No pericardial effusion. Mediastinum/Nodes: No enlarged mediastinal, hilar, or axillary lymph nodes. Thyroid gland, trachea, and esophagus demonstrate no significant findings. Lungs/Pleura: Mild centrilobular emphysema. Mild, diffuse bilateral bronchial wall thickening. Unchanged irregular, bandlike scarring of the lateral segment right middle lobe (series 6, image 22). No pleural effusion or pneumothorax. Musculoskeletal: No chest   wall mass or suspicious bone lesions identified. CT ABDOMEN PELVIS FINDINGS Hepatobiliary: Unchanged hypodense liver lesions, in particular adjacent to the gallbladder fossa, previously characterized as benign hemangiomata by contrast enhanced examination (series 2, image 71). No solid liver abnormality is seen. No gallstones, gallbladder wall thickening, or biliary  dilatation. Pancreas: Unremarkable. No pancreatic ductal dilatation or surrounding inflammatory changes. Spleen: Normal in size without significant abnormality. Adrenals/Urinary Tract: Adrenal glands are unremarkable. Kidneys are normal, without renal calculi, solid lesion, or hydronephrosis. Bladder is unremarkable. Stomach/Bowel: Stomach is within normal limits. Appendix appears normal. No evidence of bowel wall thickening, distention, or inflammatory changes. Vascular/Lymphatic: Aortic atherosclerosis. Unchanged enlarged celiac axis or gastrohepatic ligament lymph node measuring 2.4 x 1.6 cm (series 2, image 61). Reproductive: No mass or other abnormality. Other: No abdominal wall hernia or abnormality. No abdominopelvic ascites. Musculoskeletal: No acute or significant osseous findings. Status post screw fixation of the left femoral neck. IMPRESSION: 1. Unchanged enlarged celiac axis or gastrohepatic ligament lymph node. 2. No other noncontrast CT evidence of metastatic disease in the chest, abdomen, or pelvis. 3. Emphysema and mild diffuse bilateral bronchial wall thickening. 4. Coronary artery disease. Aortic Atherosclerosis (ICD10-I70.0) and Emphysema (ICD10-J43.9). Electronically Signed   By: Delanna Ahmadi M.D.   On: 04/03/2021 10:10    ASSESSMENT AND PLAN: This is a very pleasant 65 years old African-American male recently diagnosed with a stage IV (TX, N3, M1c) non-small cell lung cancer, poorly differentiated adenocarcinoma presented with bulky mediastinal as well as right hilar, supraclavicular, abdominal and right axillary lymphadenopathy in addition to metastatic liver lesions diagnosed in January 2021. Molecular studies by guardant 360 shows no actionable mutations.  The patient is not a candidate for treatment with targeted therapy or enrollment in the clinical trial with the [facility] regimen. The patient is currently undergoing systemic chemotherapy with carboplatin for AUC of 5, Alimta 500  mg/M2 and Keytruda 200 mg IV every 3 weeks status post 30 cycles. Starting from cycle #5 he is treated with maintenance Alimta and Keytruda every 3 weeks.  Starting from cycle #12 the patient has been on treatment with single agent Keytruda because of the renal insufficiency. The patient continues to tolerate this treatment well with no concerning adverse effects. He had repeat CT scan of the chest, abdomen pelvis performed recently.  I personally and independently reviewed the scan and discussed the results with the patient today. His scan showed no concerning findings for disease progression. I recommended for the patient to continue his maintenance treatment with [facility] and he will proceed with cycle #31 today. Will come back for follow-up visit in 3 weeks for evaluation before the next cycle of his treatment. For the anemia of chronic disease, he will continue on oral iron tablets and will continue to monitor his hemoglobin and hematocrit closely. For the renal insufficiency he is followed by nephrology with Dr. Joelyn Oms The patient was advised to call immediately if he has any other concerning symptoms in the interval.  The patient voices understanding of current disease status and treatment options and is in agreement with the current care plan.  All questions were answered. The patient knows to call the clinic with any problems, questions or concerns. We can certainly see the patient much sooner if necessary.  Disclaimer: This note was dictated with voice recognition software. Similar sounding words can inadvertently be transcribed and may not be corrected upon review.

## 2021-04-05 NOTE — Patient Instructions (Signed)
[facility]  Discharge Instructions: Thank you for choosing [facility] to provide your oncology and hematology care.   If you have a lab appointment with the [facility], please go directly to the [facility] and check in at the registration area.   Wear comfortable clothing and clothing appropriate for easy access to any Portacath or PICC line.   We strive to give you quality time with your provider. You may need to reschedule your appointment if you arrive late (15 or more minutes).  Arriving late affects you and other patients whose appointments are after yours.  Also, if you miss three or more appointments without notifying the office, you may be dismissed from the clinic at the provider's discretion.      For prescription refill requests, have your pharmacy contact our office and allow 72 hours for refills to be completed.    Today you received the following chemotherapy and/or immunotherapy agents Keytruda      To help prevent nausea and vomiting after your treatment, we encourage you to take your nausea medication as directed.  BELOW ARE SYMPTOMS THAT SHOULD BE REPORTED IMMEDIATELY: *FEVER GREATER THAN 100.4 F (38 C) OR HIGHER *CHILLS OR SWEATING *NAUSEA AND VOMITING THAT IS NOT CONTROLLED WITH YOUR NAUSEA MEDICATION *UNUSUAL SHORTNESS OF BREATH *UNUSUAL BRUISING OR BLEEDING *URINARY PROBLEMS (pain or burning when urinating, or frequent urination) *BOWEL PROBLEMS (unusual diarrhea, constipation, pain near the anus) TENDERNESS IN MOUTH AND THROAT WITH OR WITHOUT PRESENCE OF ULCERS (sore throat, sores in mouth, or a toothache) UNUSUAL RASH, SWELLING OR PAIN  UNUSUAL VAGINAL DISCHARGE OR ITCHING   Items with * indicate a potential emergency and should be followed up as soon as possible or go to the Emergency Department if any problems should occur.  Please show the CHEMOTHERAPY ALERT CARD or IMMUNOTHERAPY ALERT CARD at check-in to  the Emergency Department and triage nurse.  Should you have questions after your visit or need to cancel or reschedule your appointment, please contact [facility]  Dept: [phone]  and follow the prompts.  Office hours are 8:00 a.m. to 4:30 p.m. Monday - Friday. Please note that voicemails left after 4:00 p.m. may not be returned until the following business day.  We are closed weekends and major holidays. You have access to a nurse at all times for urgent questions. Please call the main number to the clinic Dept: [phone] and follow the prompts.   For any non-urgent questions, you may also contact your provider using MyChart. We now offer e-Visits for anyone 53 and older to request care online for non-urgent symptoms. For details visit [email or web address].   Also download the MyChart app! Go to the app store, search "MyChart", open the app, select [facility], and log in with your MyChart username and password.  Due to Covid, a mask is required upon entering the hospital/clinic. If you do not have a mask, one will be given to you upon arrival. For doctor visits, patients may have 1 support person aged 49 or older with them. For treatment visits, patients cannot have anyone with them due to current Covid guidelines and our immunocompromised population.   Influenza (Flu) Vaccine (Inactivated or Recombinant): What You Need to Know 1. Why get vaccinated? Influenza vaccine can prevent influenza (flu). Flu is a contagious disease that spreads around the [place] every year, usually between October and May. Anyone can get the flu, but it is more dangerous  for some people. Infants and young children, people 43 years and older, pregnant people, and people with certain health conditions or a weakened immune system are at greatest risk of flu complications. Pneumonia, bronchitis, sinus infections, and ear infections are examples of flu-related complications. If you  have a medical condition, such as heart disease, cancer, or diabetes, flu can make it worse. Flu can cause fever and chills, sore throat, muscle aches, fatigue, cough, headache, and runny or stuffy nose. Some people may have vomiting and diarrhea, though this is more common in children than adults. In an average year, thousands of people in the [place] die from flu, and many more are hospitalized. Flu vaccine prevents millions of illnesses and flu-related visits to the doctor each year. 2. Influenza vaccines CDC recommends everyone 6 months and older get vaccinated every flu season. Children 6 months through 48 years of age may need 2 doses during a single flu season. Everyone else needs only 1 dose each flu season. It takes about 2 weeks for protection to develop after vaccination. There are many flu viruses, and they are always changing. Each year a new flu vaccine is made to protect against the influenza viruses believed to be likely to cause disease in the upcoming flu season. Even when the vaccine doesn't exactly match these viruses, it may still provide some protection. Influenza vaccine does not cause flu. Influenza vaccine may be given at the same time as other vaccines. 3. Talk with your health care provider Tell your vaccination provider if the person getting the vaccine: Has had an allergic reaction after a previous dose of influenza vaccine, or has any severe, life-threatening allergies Has ever had Guillain-Barr Syndrome (also called "GBS") In some cases, your health care provider may decide to postpone influenza vaccination until a future visit. Influenza vaccine can be administered at any time during pregnancy. People who are or will be pregnant during influenza season should receive inactivated influenza vaccine. People with minor illnesses, such as a cold, may be vaccinated. People who are moderately or severely ill should usually wait until they recover before getting influenza  vaccine. Your health care provider can give you more information. 4. Risks of a vaccine reaction Soreness, redness, and swelling where the shot is given, fever, muscle aches, and headache can happen after influenza vaccination. There may be a very small increased risk of Guillain-Barr Syndrome (GBS) after inactivated influenza vaccine (the flu shot). Young children who get the flu shot along with pneumococcal vaccine (PCV13) and/or DTaP vaccine at the same time might be slightly more likely to have a seizure caused by fever. Tell your health care provider if a child who is getting flu vaccine has ever had a seizure. People sometimes faint after medical procedures, including vaccination. Tell your provider if you feel dizzy or have vision changes or ringing in the ears. As with any medicine, there is a very remote chance of a vaccine causing a severe allergic reaction, other serious injury, or death. 5. What if there is a serious problem? An allergic reaction could occur after the vaccinated person leaves the clinic. If you see signs of a severe allergic reaction (hives, swelling of the face and throat, difficulty breathing, a fast heartbeat, dizziness, or weakness), call 9-1-1 and get the person to the nearest hospital. For other signs that concern you, call your health care provider. Adverse reactions should be reported to the Vaccine Adverse Event Reporting System (VAERS). Your health care provider will usually file this  report, or you can do it yourself. Visit the VAERS website at www.vaers.SamedayNews.es or call 318 314 9016. VAERS is only for reporting reactions, and VAERS staff members do not give medical advice. 6. The National Vaccine Injury Compensation Program The [employer] Vaccine Injury Compensation Program (VICP) is a federal program that was created to compensate people who may have been injured by certain vaccines. Claims regarding alleged injury or death due to vaccination have a time limit  for filing, which may be as short as two years. Visit the VICP website at GoldCloset.com.ee or call 212-313-8131 to learn about the program and about filing a claim. 7. How can I learn more? Ask your health care provider. Call your local or state health department. Visit the website of the Food and Drug Administration (FDA) for vaccine package inserts and additional information at TraderRating.uy. Contact the Centers for Disease Control and Prevention (CDC): Call (516) 611-2607 (1-800-CDC-INFO) or Visit CDC's website at https://gibson.com/. Vaccine Information Statement Inactivated Influenza Vaccine (12/31/2019) This information is not intended to replace advice given to you by your health care provider. Make sure you discuss any questions you have with your health care provider. Document Revised: 02/17/2020 Document Reviewed: 02/17/2020 Elsevier Patient Education  2022 [employer].

## 2021-04-05 NOTE — Progress Notes (Signed)
Per Dr. [name], ok to proceed with scr.

## 2021-04-09 ENCOUNTER — Telehealth: Payer: Self-pay

## 2021-04-09 DIAGNOSIS — E1151 Type 2 diabetes mellitus with diabetic peripheral angiopathy without gangrene: Secondary | ICD-10-CM

## 2021-04-09 MED ORDER — ACCU-CHEK GUIDE VI STRP: ORAL_STRIP | 12 refills | Status: DC

## 2021-04-09 NOTE — Telephone Encounter (Signed)
Rx sent. Patient notified. 

## 2021-04-09 NOTE — Telephone Encounter (Signed)
Pt said he got the monitor but he needs the strips that should come with it.  ACCU check

## 2021-04-26 ENCOUNTER — Inpatient Hospital Stay (HOSPITAL_BASED_OUTPATIENT_CLINIC_OR_DEPARTMENT_OTHER): Payer: Medicare Other | Admitting: Internal Medicine

## 2021-04-26 ENCOUNTER — Inpatient Hospital Stay: Payer: Medicare Other | Attending: Internal Medicine

## 2021-04-26 ENCOUNTER — Other Ambulatory Visit: Payer: Self-pay

## 2021-04-26 ENCOUNTER — Inpatient Hospital Stay: Payer: Medicare Other

## 2021-04-26 VITALS — BP 161/66 | HR 95 | Temp 96.4°F | Resp 20 | Ht 75.0 in | Wt 230.2 lb

## 2021-04-26 DIAGNOSIS — C3490 Malignant neoplasm of unspecified part of unspecified bronchus or lung: Secondary | ICD-10-CM | POA: Diagnosis present

## 2021-04-26 DIAGNOSIS — C3491 Malignant neoplasm of unspecified part of right bronchus or lung: Secondary | ICD-10-CM

## 2021-04-26 DIAGNOSIS — R591 Generalized enlarged lymph nodes: Secondary | ICD-10-CM

## 2021-04-26 DIAGNOSIS — Z5112 Encounter for antineoplastic immunotherapy: Secondary | ICD-10-CM | POA: Insufficient documentation

## 2021-04-26 DIAGNOSIS — R5383 Other fatigue: Secondary | ICD-10-CM

## 2021-04-26 DIAGNOSIS — C787 Secondary malignant neoplasm of liver and intrahepatic bile duct: Secondary | ICD-10-CM

## 2021-04-26 DIAGNOSIS — D638 Anemia in other chronic diseases classified elsewhere: Secondary | ICD-10-CM

## 2021-04-26 DIAGNOSIS — N289 Disorder of kidney and ureter, unspecified: Secondary | ICD-10-CM

## 2021-04-26 DIAGNOSIS — Z79899 Other long term (current) drug therapy: Secondary | ICD-10-CM | POA: Insufficient documentation

## 2021-04-26 DIAGNOSIS — Z95828 Presence of other vascular implants and grafts: Secondary | ICD-10-CM

## 2021-04-26 LAB — CBC WITH DIFFERENTIAL (CANCER CENTER ONLY)
Abs Immature Granulocytes: 0.01 10*3/uL (ref 0.00–0.07)
Basophils Absolute: 0.1 10*3/uL (ref 0.0–0.1)
Basophils Relative: 1 %
Eosinophils Absolute: 0.2 10*3/uL (ref 0.0–0.5)
Eosinophils Relative: 3 %
HCT: 30.9 % — ABNORMAL LOW (ref 39.0–52.0)
Hemoglobin: 9.7 g/dL — ABNORMAL LOW (ref 13.0–17.0)
Immature Granulocytes: 0 %
Lymphocytes Relative: 36 %
Lymphs Abs: 2.3 10*3/uL (ref 0.7–4.0)
MCH: 26.1 pg (ref 26.0–34.0)
MCHC: 31.4 g/dL (ref 30.0–36.0)
MCV: 83.1 fL (ref 80.0–100.0)
Monocytes Absolute: 0.7 10*3/uL (ref 0.1–1.0)
Monocytes Relative: 12 %
Neutro Abs: 3.1 10*3/uL (ref 1.7–7.7)
Neutrophils Relative %: 48 %
Platelet Count: 273 10*3/uL (ref 150–400)
RBC: 3.72 MIL/uL — ABNORMAL LOW (ref 4.22–5.81)
RDW: 15 % (ref 11.5–15.5)
WBC Count: 6.3 10*3/uL (ref 4.0–10.5)
nRBC: 0 % (ref 0.0–0.2)

## 2021-04-26 LAB — CMP (CANCER CENTER ONLY)
ALT: 22 U/L (ref 0–44)
AST: 20 U/L (ref 15–41)
Albumin: 3.8 g/dL (ref 3.5–5.0)
Alkaline Phosphatase: 76 U/L (ref 38–126)
Anion gap: 9 (ref 5–15)
BUN: 18 mg/dL (ref 8–23)
CO2: 24 mmol/L (ref 22–32)
Calcium: 9.1 mg/dL (ref 8.9–10.3)
Chloride: 107 mmol/L (ref 98–111)
Creatinine: 1.52 mg/dL — ABNORMAL HIGH (ref 0.61–1.24)
GFR, Estimated: 50 mL/min — ABNORMAL LOW (ref >60)
Glucose, Bld: 196 mg/dL — ABNORMAL HIGH (ref 70–99)
Potassium: 4.2 mmol/L (ref 3.5–5.1)
Sodium: 140 mmol/L (ref 135–145)
Total Bilirubin: 0.3 mg/dL (ref 0.3–1.2)
Total Protein: 7.8 g/dL (ref 6.5–8.1)

## 2021-04-26 LAB — TSH: TSH: 0.949 u[IU]/mL (ref 0.320–4.118)

## 2021-04-26 MED ORDER — SODIUM CHLORIDE 0.9 % IV SOLN
200.0000 mg | Freq: Once | INTRAVENOUS | Status: AC
  Administered 2021-04-26: 200 mg via INTRAVENOUS
  Filled 2021-04-26: qty 8

## 2021-04-26 MED ORDER — SODIUM CHLORIDE 0.9 % IV SOLN: Freq: Once | INTRAVENOUS | Status: AC

## 2021-04-26 MED ORDER — SODIUM CHLORIDE 0.9% FLUSH
10.0000 mL | Freq: Once | INTRAVENOUS | Status: AC
  Administered 2021-04-26: 10 mL

## 2021-04-26 MED ORDER — SODIUM CHLORIDE 0.9% FLUSH
10.0000 mL | INTRAVENOUS | Status: DC | PRN
  Administered 2021-04-26: 10 mL

## 2021-04-26 MED ORDER — HEPARIN SOD (PORK) LOCK FLUSH 100 UNIT/ML IV SOLN
500.0000 [IU] | Freq: Once | INTRAVENOUS | Status: AC | PRN
  Administered 2021-04-26: 500 [IU]

## 2021-04-26 NOTE — Progress Notes (Signed)
Per Dr. [name], it is okay to treat pt today with Keytruda and serum creatinine 1.52.

## 2021-04-26 NOTE — Progress Notes (Signed)
[facility] [phone]   [phone]  OFFICE PROGRESS NOTE  [name], NP [address] [name] [address]  DIAGNOSIS: Stage IV (TX, N3, M1c) non-small cell lung cancer, poorly differentiated adenocarcinoma presented with bulky mediastinal lymphadenopathy in addition to right axillary lymphadenopathy and metastatic liver lesions, abdominal lymphadenopathy diagnosed in January 2021. Molecular studies by [employer] that shows no actionable mutations.  PRIOR THERAPY: None  CURRENT THERAPY: Systemic chemotherapy with carboplatin for AUC of 5, Alimta 500 mg/M2 and Keytruda 200 mg IV every 3 weeks.  First dose July 14, 2019.  Status post 30  cycles. Starting from cycle #5 the patient will be treated with maintenance Alimta and Keytruda every 3 weeks.  Starting from cycle #12 he will be on single agent treatment with Keytruda secondary to renal insufficiency.  INTERVAL HISTORY: Kirk [name] 65 y.o. male returns to the clinic today for follow-up visit.  The patient is feeling fine today with no concerning complaints except for pain on the right shoulder with decreased range of motion.  He denied having any current chest pain, shortness of breath, cough or hemoptysis.  He denied having any fever or chills.  He has no nausea, vomiting, diarrhea or constipation.  He has no headache or visual changes.  The patient continues to tolerate his treatment with [facility] fairly well.  MEDICAL HISTORY: Past Medical History:  Diagnosis Date   Arthritis    "right knee" (09/04/2015)   Atrial flutter with rapid ventricular response ([facility]) 04/19/2019   Dizziness 10/2019   Dyspnea    increased exertion   Elevated serum creatinine 07/2020   HTN (hypertension)    Hyperlipidemia    Lung cancer ([facility]) 05/2019   Microalbuminuria 07/2020   NSTEMI (non-ST elevated myocardial infarction) ([facility]) 09/04/2015   a. cath 09/05/2015: 95% stenosis mid-LAD, 55%  mid-RCA, and 40% prox-RCA. PCI performed w/ Synergy DES to LAD   PAD (peripheral artery disease) ([facility])    Stenting of bilateral iliacs in 2003.   Refusal of blood transfusions as patient is Jehovah's Witness    Type II diabetes mellitus ([facility])    Vitamin D deficiency 07/2020    ALLERGIES:  has No Known Allergies.  MEDICATIONS:  Current Outpatient Medications  Medication Sig Dispense Refill   acetaminophen (TYLENOL) 500 MG tablet Take 500 mg by mouth every 6 (six) hours as needed for moderate pain or fever.      aspirin EC 81 MG tablet Take 81 mg by mouth at bedtime.     benzonatate (TESSALON) 100 MG capsule Take 1 capsule (100 mg total) by mouth 3 (three) times daily as needed for cough. 30 capsule 0   blood glucose meter kit and supplies Dispense based on patient and insurance preference. Use up to four times daily as directed. (FOR ICD-10 E10.9, E11.9). 1 each 0   carvedilol (COREG) 12.5 MG tablet Take 1 tablet (12.5 mg total) by mouth 2 (two) times daily with a meal. 180 tablet 3   cetirizine (ZYRTEC) 10 MG tablet Take 1 tablet (10 mg total) by mouth daily. (Patient not taking: No sig reported) 90 tablet 3   clopidogrel (PLAVIX) 75 MG tablet Take 1 tablet (75 mg total) by mouth daily. 90 tablet 3   docusate sodium (COLACE) 100 MG capsule Take 1 capsule (100 mg total) by mouth 2 (two) times daily as needed for mild constipation. 180 capsule 3   doxycycline (VIBRA-TABS) 100 MG tablet Take  1 tablet (100 mg total) by mouth 2 (two) times daily. 14 tablet 0   ferrous sulfate 324 MG TBEC Take 1 tablet (324 mg total) by mouth daily with breakfast. (Patient not taking: No sig reported) 30 tablet 11   gabapentin (NEURONTIN) 300 MG capsule Take 1 capsule (300 mg total) by mouth 3 (three) times daily. 270 capsule 3   glimepiride (AMARYL) 4 MG tablet Take 1 tablet (4 mg total) by mouth daily. 90 tablet 3   glucose blood (ACCU-CHEK GUIDE) test strip Use as instructed up to four times daily as directed.  100 each 12   Hydrocortisone Acetate 1 % CREA Apply 15 g topically 2 (two) times daily. (Patient not taking: No sig reported) 15 g 3   lactulose (CHRONULAC) 10 GM/15ML solution Take 30 mLs (20 g total) by mouth 2 (two) times daily as needed for moderate constipation. 240 mL 11   lidocaine-prilocaine (EMLA) cream APPLY TO THE PORT-A-CATH SITE 30-60 MINUTES BEFORE TREATMENT. (Patient taking differently: Apply 1 application topically daily as needed (access port). Apply to the Port-A-Cath site 30-60 minutes before treatment.) 30 g 0   multivitamin (ONE-A-DAY MEN'S) TABS tablet Take 1 tablet by mouth daily. 30 tablet 6   nitroGLYCERIN (NITROSTAT) 0.4 MG SL tablet Place 1 tablet (0.4 mg total) under the tongue every 5 (five) minutes x 3 doses as needed for chest pain. (Patient not taking: No sig reported) 25 tablet 2   rosuvastatin (CRESTOR) 40 MG tablet Take 1 tablet (40 mg total) by mouth daily. 90 tablet 3   No current facility-administered medications for this visit.    SURGICAL HISTORY:  Past Surgical History:  Procedure Laterality Date   CARDIAC CATHETERIZATION N/A 09/05/2015   Procedure: Left Heart Cath and Coronary Angiography;  Surgeon: Troy Sine, MD;  Location: [facility];  Service: Cardiovascular;  Laterality: N/A;   CARDIAC CATHETERIZATION N/A 09/05/2015   Procedure: Coronary Stent Intervention;  Surgeon: Troy Sine, MD;  Location: [facility];  Service: Cardiovascular;  Laterality: N/A;   IR IMAGING GUIDED PORT INSERTION  07/15/2019   ORIF CONGENITAL HIP DISLOCATION Left ~ 1965   "put pins in it"   PERIPHERAL VASCULAR CATHETERIZATION Bilateral 2003   "stenting"   TONSILLECTOMY  ~ [place]:  A comprehensive review of systems was negative except for: Constitutional: positive for fatigue Musculoskeletal: positive for arthralgias   PHYSICAL EXAMINATION: General appearance: alert, cooperative, appears stated age, fatigued, and no distress Head:  Normocephalic, without obvious abnormality, atraumatic Neck: no adenopathy, no JVD, supple, symmetrical, trachea midline, and thyroid not enlarged, symmetric, no tenderness/mass/nodules Lymph nodes: Cervical, supraclavicular, and axillary nodes normal. Resp: clear to auscultation bilaterally Back: symmetric, no curvature. ROM normal. No CVA tenderness. Cardio: regular rate and rhythm, S1, S2 normal, no murmur, click, rub or gallop GI: soft, non-tender; bowel sounds normal; no masses,  no organomegaly Extremities: extremities normal, atraumatic, no cyanosis or edema  ECOG PERFORMANCE STATUS: 1 - Symptomatic but completely ambulatory  Blood pressure (!) 161/66, pulse 95, temperature (!) 96.4 F (35.8 C), temperature source Tympanic, resp. rate 20, height _0  (1.905 m), weight 230 lb 3.2 oz (104.4 kg), SpO2 100 %.  LABORATORY DATA: Lab Results  Component Value Date   WBC 6.3 04/26/2021   HGB 9.7 (L) 04/26/2021   HCT 30.9 (L) 04/26/2021   MCV 83.1 04/26/2021   PLT 273 04/26/2021      Chemistry      Component  Value Date/Time   NA 138 04/05/2021 0904   NA 140 06/08/2015 0840   K 4.3 04/05/2021 0904   CL 106 04/05/2021 0904   CO2 23 04/05/2021 0904   BUN 24 (H) 04/05/2021 0904   BUN 15 06/08/2015 0840   CREATININE 1.60 (H) 04/05/2021 0904   CREATININE 1.06 10/01/2016 0855      Component Value Date/Time   CALCIUM 9.3 04/05/2021 0904   ALKPHOS 71 04/05/2021 0904   AST 29 04/05/2021 0904   ALT 50 (H) 04/05/2021 0904   BILITOT 0.4 04/05/2021 0904       RADIOGRAPHIC STUDIES: CT Abdomen Pelvis Wo Contrast  Result Date: 04/03/2021 CLINICAL DATA:  Metastatic lung cancer, ongoing immunotherapy EXAM: CT CHEST, ABDOMEN AND PELVIS WITHOUT CONTRAST TECHNIQUE: Multidetector CT imaging of the chest, abdomen and pelvis was performed following the standard protocol without IV contrast. COMPARISON:  01/08/2021 FINDINGS: CT CHEST FINDINGS Cardiovascular: Right chest port catheter. Aortic  atherosclerosis. Normal heart size. Three-vessel coronary artery calcifications. No pericardial effusion. Mediastinum/Nodes: No enlarged mediastinal, hilar, or axillary lymph nodes. Thyroid gland, trachea, and esophagus demonstrate no significant findings. Lungs/Pleura: Mild centrilobular emphysema. Mild, diffuse bilateral bronchial wall thickening. Unchanged irregular, bandlike scarring of the lateral segment right middle lobe (series 6, image 22). No pleural effusion or pneumothorax. Musculoskeletal: No chest wall mass or suspicious bone lesions identified. CT ABDOMEN PELVIS FINDINGS Hepatobiliary: Unchanged hypodense liver lesions, in particular adjacent to the gallbladder fossa, previously characterized as benign hemangiomata by contrast enhanced examination (series 2, image 71). No solid liver abnormality is seen. No gallstones, gallbladder wall thickening, or biliary dilatation. Pancreas: Unremarkable. No pancreatic ductal dilatation or surrounding inflammatory changes. Spleen: Normal in size without significant abnormality. Adrenals/Urinary Tract: Adrenal glands are unremarkable. Kidneys are normal, without renal calculi, solid lesion, or hydronephrosis. Bladder is unremarkable. Stomach/Bowel: Stomach is within normal limits. Appendix appears normal. No evidence of bowel wall thickening, distention, or inflammatory changes. Vascular/Lymphatic: Aortic atherosclerosis. Unchanged enlarged celiac axis or gastrohepatic ligament lymph node measuring 2.4 x 1.6 cm (series 2, image 61). Reproductive: No mass or other abnormality. Other: No abdominal wall hernia or abnormality. No abdominopelvic ascites. Musculoskeletal: No acute or significant osseous findings. Status post screw fixation of the left femoral neck. IMPRESSION: 1. Unchanged enlarged celiac axis or gastrohepatic ligament lymph node. 2. No other noncontrast CT evidence of metastatic disease in the chest, abdomen, or pelvis. 3. Emphysema and mild diffuse  bilateral bronchial wall thickening. 4. Coronary artery disease. Aortic Atherosclerosis (ICD10-I70.0) and Emphysema (ICD10-J43.9). Electronically Signed   By: Delanna Ahmadi M.D.   On: 04/03/2021 10:10   CT Chest Wo Contrast  Result Date: 04/03/2021 CLINICAL DATA:  Metastatic lung cancer, ongoing immunotherapy EXAM: CT CHEST, ABDOMEN AND PELVIS WITHOUT CONTRAST TECHNIQUE: Multidetector CT imaging of the chest, abdomen and pelvis was performed following the standard protocol without IV contrast. COMPARISON:  01/08/2021 FINDINGS: CT CHEST FINDINGS Cardiovascular: Right chest port catheter. Aortic atherosclerosis. Normal heart size. Three-vessel coronary artery calcifications. No pericardial effusion. Mediastinum/Nodes: No enlarged mediastinal, hilar, or axillary lymph nodes. Thyroid gland, trachea, and esophagus demonstrate no significant findings. Lungs/Pleura: Mild centrilobular emphysema. Mild, diffuse bilateral bronchial wall thickening. Unchanged irregular, bandlike scarring of the lateral segment right middle lobe (series 6, image 22). No pleural effusion or pneumothorax. Musculoskeletal: No chest wall mass or suspicious bone lesions identified. CT ABDOMEN PELVIS FINDINGS Hepatobiliary: Unchanged hypodense liver lesions, in particular adjacent to the gallbladder fossa, previously characterized as benign hemangiomata by contrast enhanced examination (series 2, image 71). No  solid liver abnormality is seen. No gallstones, gallbladder wall thickening, or biliary dilatation. Pancreas: Unremarkable. No pancreatic ductal dilatation or surrounding inflammatory changes. Spleen: Normal in size without significant abnormality. Adrenals/Urinary Tract: Adrenal glands are unremarkable. Kidneys are normal, without renal calculi, solid lesion, or hydronephrosis. Bladder is unremarkable. Stomach/Bowel: Stomach is within normal limits. Appendix appears normal. No evidence of bowel wall thickening, distention, or inflammatory  changes. Vascular/Lymphatic: Aortic atherosclerosis. Unchanged enlarged celiac axis or gastrohepatic ligament lymph node measuring 2.4 x 1.6 cm (series 2, image 61). Reproductive: No mass or other abnormality. Other: No abdominal wall hernia or abnormality. No abdominopelvic ascites. Musculoskeletal: No acute or significant osseous findings. Status post screw fixation of the left femoral neck. IMPRESSION: 1. Unchanged enlarged celiac axis or gastrohepatic ligament lymph node. 2. No other noncontrast CT evidence of metastatic disease in the chest, abdomen, or pelvis. 3. Emphysema and mild diffuse bilateral bronchial wall thickening. 4. Coronary artery disease. Aortic Atherosclerosis (ICD10-I70.0) and Emphysema (ICD10-J43.9). Electronically Signed   By: Delanna Ahmadi M.D.   On: 04/03/2021 10:10    ASSESSMENT AND PLAN: This is a very pleasant 65 years old African-American male recently diagnosed with a stage IV (TX, N3, M1c) non-small cell lung cancer, poorly differentiated adenocarcinoma presented with bulky mediastinal as well as right hilar, supraclavicular, abdominal and right axillary lymphadenopathy in addition to metastatic liver lesions diagnosed in January 2021. Molecular studies by guardant 360 shows no actionable mutations.  The patient is not a candidate for treatment with targeted therapy or enrollment in the clinical trial with the [facility] regimen. The patient is currently undergoing systemic chemotherapy with carboplatin for AUC of 5, Alimta 500 mg/M2 and Keytruda 200 mg IV every 3 weeks status post 31 cycles. Starting from cycle #5 he is treated with maintenance Alimta and Keytruda every 3 weeks.  Starting from cycle #12 the patient has been on treatment with single agent Keytruda because of the renal insufficiency. He continues to tolerate this treatment well with no concerning adverse effects. I recommended for the patient to proceed with cycle #32 today as planned. He will come back for  follow-up visit in 3 weeks for evaluation before the next cycle of his treatment. For the anemia of chronic disease, he will continue on oral iron tablets and will continue to monitor his hemoglobin and hematocrit closely. For the renal insufficiency he is followed by nephrology with Dr. Joelyn Oms He was advised to call immediately if he has any other concerning symptoms in the interval. The patient voices understanding of current disease status and treatment options and is in agreement with the current care plan.  All questions were answered. The patient knows to call the clinic with any problems, questions or concerns. We can certainly see the patient much sooner if necessary.  Disclaimer: This note was dictated with voice recognition software. Similar sounding words can inadvertently be transcribed and may not be corrected upon review.

## 2021-04-26 NOTE — Patient Instructions (Signed)
[facility]  Discharge Instructions: ?Thank you for choosing [facility] to provide your oncology and hematology care.  ? ?If you have a lab appointment with the Cancer Center, please go directly to the Cancer Center and check in at the registration area. ?  ?Wear comfortable clothing and clothing appropriate for easy access to any Portacath or PICC line.  ? ?We strive to give you quality time with your provider. You may need to reschedule your appointment if you arrive late (15 or more minutes).  Arriving late affects you and other patients whose appointments are after yours.  Also, if you miss three or more appointments without notifying the office, you may be dismissed from the clinic at the provider?s discretion.    ?  ?For prescription refill requests, have your pharmacy contact our office and allow 72 hours for refills to be completed.   ? ?Today you received the following chemotherapy and/or immunotherapy agents: Keytruda ?  ?To help prevent nausea and vomiting after your treatment, we encourage you to take your nausea medication as directed. ? ?BELOW ARE SYMPTOMS THAT SHOULD BE REPORTED IMMEDIATELY: ?*FEVER GREATER THAN 100.4 F (38 ?C) OR HIGHER ?*CHILLS OR SWEATING ?*NAUSEA AND VOMITING THAT IS NOT CONTROLLED WITH YOUR NAUSEA MEDICATION ?*UNUSUAL SHORTNESS OF BREATH ?*UNUSUAL BRUISING OR BLEEDING ?*URINARY PROBLEMS (pain or burning when urinating, or frequent urination) ?*BOWEL PROBLEMS (unusual diarrhea, constipation, pain near the anus) ?TENDERNESS IN MOUTH AND THROAT WITH OR WITHOUT PRESENCE OF ULCERS (sore throat, sores in mouth, or a toothache) ?UNUSUAL RASH, SWELLING OR PAIN  ?UNUSUAL VAGINAL DISCHARGE OR ITCHING  ? ?Items with * indicate a potential emergency and should be followed up as soon as possible or go to the Emergency Department if any problems should occur. ? ?Please show the CHEMOTHERAPY ALERT CARD or IMMUNOTHERAPY ALERT CARD at check-in to the  Emergency Department and triage nurse. ? ?Should you have questions after your visit or need to cancel or reschedule your appointment, please contact [facility]  Dept: [phone]  and follow the prompts.  Office hours are 8:00 a.m. to 4:30 p.m. Monday - Friday. Please note that voicemails left after 4:00 p.m. may not be returned until the following business day.  We are closed weekends and major holidays. You have access to a nurse at all times for urgent questions. Please call the main number to the clinic Dept: [phone] and follow the prompts. ? ? ?For any non-urgent questions, you may also contact your provider using MyChart. We now offer e-Visits for anyone 18 and older to request care online for non-urgent symptoms. For details visit [email or web address]. ?  ?Also download the MyChart app! Go to the app store, search "MyChart", open the app, select [facility], and log in with your MyChart username and password. ? ?Due to Covid, a mask is required upon entering the hospital/clinic. If you do not have a mask, one will be given to you upon arrival. For doctor visits, patients may have 1 support person aged 18 or older with them. For treatment visits, patients cannot have anyone with them due to current Covid guidelines and our immunocompromised population.  ? ?

## 2021-05-11 NOTE — Progress Notes (Signed)
[facility] OFFICE PROGRESS NOTE  Kirk Francois, NP [address] [place] [address]  DIAGNOSIS: Stage IV (TX, N3, M1c) non-small cell lung cancer, poorly differentiated adenocarcinoma presented with bulky mediastinal lymphadenopathy in addition to right axillary lymphadenopathy and metastatic liver lesions, abdominal lymphadenopathy diagnosed in January 2021. Molecular studies by [employer] that shows no actionable mutations.  PRIOR THERAPY: None  CURRENT THERAPY: Systemic chemotherapy with carboplatin for AUC of 5, Alimta 500 mg/M2 and Keytruda 200 mg IV every 3 weeks.  First dose July 14, 2019.  Status post 32 cycles. Starting from cycle #5 the patient will be treated with maintenance Alimta and Keytruda every 3 weeks. Starting from cycle #12 he will be on single agent treatment with Keytruda secondary to renal insufficiency.  INTERVAL HISTORY: Kirk [name] 65 y.o. male returns to clinic today for a follow-up visit.  The patient is feeling fairly well today without any concerning complaints except he has a cold. He has had nasal congestion for 1 week. He states he was checked for covid-19 which was negative. He denies associated fevers, facial pain/pressure, sore throat, loss of taste/smell, cough, or worsening shortness of breath compared to his baseline. Today, he denies any chills, night sweats, or appetite changes.  Denies any chest pain or hemoptysis.  Denies any nausea, vomiting, or diarrhea.  Denies constipation. Denies any rashes or skin changes.  Denies any headaches or visual changes. The patient is here today for evaluation and repeat blood work before starting cycle #33  MEDICAL HISTORY: Past Medical History:  Diagnosis Date   Arthritis    "right knee" (09/04/2015)   Atrial flutter with rapid ventricular response ([place]) 04/19/2019   Dizziness 10/2019   Dyspnea    increased exertion   Elevated serum creatinine 07/2020   HTN (hypertension)     Hyperlipidemia    Lung cancer ([place]) 05/2019   Microalbuminuria 07/2020   NSTEMI (non-ST elevated myocardial infarction) ([place]) 09/04/2015   a. cath 09/05/2015: 95% stenosis mid-LAD, 55% mid-RCA, and 40% prox-RCA. PCI performed w/ Synergy DES to LAD   PAD (peripheral artery disease) ([facility])    Stenting of bilateral iliacs in 2003.   Refusal of blood transfusions as patient is Jehovah's Witness    Type II diabetes mellitus ([place])    Vitamin D deficiency 07/2020    ALLERGIES:  has No Known Allergies.  MEDICATIONS:  Current Outpatient Medications  Medication Sig Dispense Refill   acetaminophen (TYLENOL) 500 MG tablet Take 500 mg by mouth every 6 (six) hours as needed for moderate pain or fever.      aspirin EC 81 MG tablet Take 81 mg by mouth at bedtime.     benzonatate (TESSALON) 100 MG capsule Take 1 capsule (100 mg total) by mouth 3 (three) times daily as needed for cough. 30 capsule 0   blood glucose meter kit and supplies Dispense based on patient and insurance preference. Use up to four times daily as directed. (FOR ICD-10 E10.9, E11.9). 1 each 0   carvedilol (COREG) 12.5 MG tablet Take 1 tablet (12.5 mg total) by mouth 2 (two) times daily with a meal. 180 tablet 3   cetirizine (ZYRTEC) 10 MG tablet Take 1 tablet (10 mg total) by mouth daily. (Patient not taking: No sig reported) 90 tablet 3   clopidogrel (PLAVIX) 75 MG tablet Take 1 tablet (75 mg total) by mouth daily. 90 tablet 3   docusate sodium (COLACE) 100 MG capsule Take 1 capsule (100  mg total) by mouth 2 (two) times daily as needed for mild constipation. 180 capsule 3   doxycycline (VIBRA-TABS) 100 MG tablet Take 1 tablet (100 mg total) by mouth 2 (two) times daily. 14 tablet 0   ferrous sulfate 324 MG TBEC Take 1 tablet (324 mg total) by mouth daily with breakfast. (Patient not taking: No sig reported) 30 tablet 11   gabapentin (NEURONTIN) 300 MG capsule Take 1 capsule (300 mg total) by mouth 3 (three) times daily. 270 capsule 3    glimepiride (AMARYL) 4 MG tablet Take 1 tablet (4 mg total) by mouth daily. 90 tablet 3   glucose blood (ACCU-CHEK GUIDE) test strip Use as instructed up to four times daily as directed. 100 each 12   Hydrocortisone Acetate 1 % CREA Apply 15 g topically 2 (two) times daily. (Patient not taking: No sig reported) 15 g 3   lactulose (CHRONULAC) 10 GM/15ML solution Take 30 mLs (20 g total) by mouth 2 (two) times daily as needed for moderate constipation. 240 mL 11   lidocaine-prilocaine (EMLA) cream APPLY TO THE PORT-A-CATH SITE 30-60 MINUTES BEFORE TREATMENT. (Patient taking differently: Apply 1 application topically daily as needed (access port). Apply to the Port-A-Cath site 30-60 minutes before treatment.) 30 g 0   multivitamin (ONE-A-DAY MEN'S) TABS tablet Take 1 tablet by mouth daily. 30 tablet 6   nitroGLYCERIN (NITROSTAT) 0.4 MG SL tablet Place 1 tablet (0.4 mg total) under the tongue every 5 (five) minutes x 3 doses as needed for chest pain. (Patient not taking: No sig reported) 25 tablet 2   rosuvastatin (CRESTOR) 40 MG tablet Take 1 tablet (40 mg total) by mouth daily. 90 tablet 3   No current facility-administered medications for this visit.    SURGICAL HISTORY:  Past Surgical History:  Procedure Laterality Date   CARDIAC CATHETERIZATION N/A 09/05/2015   Procedure: Left Heart Cath and Coronary Angiography;  Surgeon: Troy Sine, MD;  Location: [facility];  Service: Cardiovascular;  Laterality: N/A;   CARDIAC CATHETERIZATION N/A 09/05/2015   Procedure: Coronary Stent Intervention;  Surgeon: Troy Sine, MD;  Location: [facility];  Service: Cardiovascular;  Laterality: N/A;   IR IMAGING GUIDED PORT INSERTION  07/15/2019   ORIF CONGENITAL HIP DISLOCATION Left ~ 1965   "put pins in it"   PERIPHERAL VASCULAR CATHETERIZATION Bilateral 2003   "stenting"   TONSILLECTOMY  ~ [place]:   Constitutional: Negative for appetite change, chills, fatigue, fever  and unexpected weight change.  HENT: Positive for nasal congestion. Negative for mouth sores, nosebleeds, sore throat and trouble swallowing.   Eyes: Negative for eye problems and icterus.  Respiratory: Positive for baseline dyspnea on exertion. Negative for cough, hemoptysis, and wheezing.   Cardiovascular: Negative for chest pain and leg swelling.  Gastrointestinal: Negative for abdominal pain, constipation, diarrhea, nausea and vomiting.  Genitourinary: Negative for bladder incontinence, difficulty urinating, dysuria, frequency and hematuria.   Musculoskeletal: Negative for back pain, gait problem, neck pain and neck stiffness.  Skin: Negative for itching and rash.  Neurological: Negative for dizziness, extremity weakness, gait problem, headaches, light-headedness and seizures.  Hematological: Negative for adenopathy. Does not bruise/bleed easily.  Psychiatric/Behavioral: Negative for confusion, depression and sleep disturbance. The patient is not nervous/anxious.     PHYSICAL EXAMINATION:  There were no vitals taken for this visit.  ECOG PERFORMANCE STATUS: 1  Physical Exam  Constitutional: Oriented to person, place, and time and well-developed, well-nourished, and  in no distress.  HENT:  Head: Normocephalic and atraumatic.  Mouth/Throat: Oropharynx is clear and moist. No oropharyngeal exudate.  Eyes: Conjunctivae are normal. Right eye exhibits no discharge. Left eye exhibits no discharge. No scleral icterus.  Neck: Normal range of motion. Neck supple.  Cardiovascular: Normal rate, regular rhythm, normal heart sounds and intact distal pulses.   Pulmonary/Chest: Effort normal and breath sounds normal. No respiratory distress. No wheezes. No rales.  Abdominal: Soft. Bowel sounds are normal. Exhibits no distension and no mass. There is no tenderness.  Musculoskeletal: Normal range of motion. Exhibits no edema.  Lymphadenopathy:    No cervical adenopathy.  Neurological: Alert and  oriented to person, place, and time. Exhibits normal muscle tone. Gait normal. Coordination normal.  Skin: Skin is warm and dry. No rash noted. Not diaphoretic. No erythema. No pallor.  Psychiatric: Mood, memory and judgment normal.  Vitals reviewed.  LABORATORY DATA: Lab Results  Component Value Date   WBC 6.3 04/26/2021   HGB 9.7 (L) 04/26/2021   HCT 30.9 (L) 04/26/2021   MCV 83.1 04/26/2021   PLT 273 04/26/2021      Chemistry      Component Value Date/Time   NA 140 04/26/2021 0926   NA 140 06/08/2015 0840   K 4.2 04/26/2021 0926   CL 107 04/26/2021 0926   CO2 24 04/26/2021 0926   BUN 18 04/26/2021 0926   BUN 15 06/08/2015 0840   CREATININE 1.52 (H) 04/26/2021 0926   CREATININE 1.06 10/01/2016 0855      Component Value Date/Time   CALCIUM 9.1 04/26/2021 0926   ALKPHOS 76 04/26/2021 0926   AST 20 04/26/2021 0926   ALT 22 04/26/2021 0926   BILITOT 0.3 04/26/2021 0926       RADIOGRAPHIC STUDIES:  No results found.   ASSESSMENT/PLAN:  This is a very pleasant 65 year old African-American male diagnosed with stage IV (TX, N3, M1C) non-small cell lung cancer, poorly differentiated adenocarcinoma. He presented with bulky mediastinal as well as right hilar, supraclavicular, abdominal, and right axillary lymphadenopathy in addition to metastatic disease to the liver. He was diagnosed in January 2021. The patient does not have any actionable mutations. The patient was not a candidate for treatment with targeted therapy or enrollment in a clinical trial with the [facility] regimen.   The patient is currently undergoing systemic chemotherapy with carboplatin for an AUC of 5, Alimta 500 mg per metered squared, Keytruda 200 mg IV every 3 weeks. He is status post 32 cycles. Starting from cycle #5 patient has been on maintenance Alimta and Keytruda IV every 3 weeks. Starting from cycle #12 he has been on single agent treatment with Keytruda secondary to renal insufficiency.   Labs were  reviewed. His labs show his baseline CKD with a creatinine of 1.70. He follows with Dr. Joelyn Oms from nephrology. Recommend that he proceed with treatment today as scheduled with immunotherapy with Keytruda.   We will see him back for a follow up visit in 3 weeks for evaluation and to review his scan results before starting cycle #34.   Advised to use nasal sprays and mucinex for his nasal congestion. He denies fevers, sore throat, cough, N/V, weakness, facial pain/pressure, or shortness of breath.    The patient was advised to call immediately if he has any concerning symptoms in the interval. The patient voices understanding of current disease status and treatment options and is in agreement with the current care plan. All questions were answered. The patient knows to  call the clinic with any problems, questions or concerns. We can certainly see the patient much sooner if necessary     No orders of the defined types were placed in this encounter.    The total time spent in the appointment was 20-29 minutes  Carolee Channell L Petronella Shuford, PA-C 05/11/21

## 2021-05-12 ENCOUNTER — Other Ambulatory Visit: Payer: Self-pay

## 2021-05-17 ENCOUNTER — Inpatient Hospital Stay: Payer: Medicare Other

## 2021-05-17 ENCOUNTER — Other Ambulatory Visit: Payer: Self-pay

## 2021-05-17 ENCOUNTER — Inpatient Hospital Stay (HOSPITAL_BASED_OUTPATIENT_CLINIC_OR_DEPARTMENT_OTHER): Payer: Medicare Other | Admitting: Physician Assistant

## 2021-05-17 VITALS — BP 122/57 | HR 92 | Temp 97.2°F | Resp 20 | Wt 232.9 lb

## 2021-05-17 DIAGNOSIS — Z5112 Encounter for antineoplastic immunotherapy: Secondary | ICD-10-CM

## 2021-05-17 DIAGNOSIS — I10 Essential (primary) hypertension: Secondary | ICD-10-CM

## 2021-05-17 DIAGNOSIS — Z95828 Presence of other vascular implants and grafts: Secondary | ICD-10-CM

## 2021-05-17 DIAGNOSIS — C3491 Malignant neoplasm of unspecified part of right bronchus or lung: Secondary | ICD-10-CM

## 2021-05-17 DIAGNOSIS — R5383 Other fatigue: Secondary | ICD-10-CM

## 2021-05-17 DIAGNOSIS — C3490 Malignant neoplasm of unspecified part of unspecified bronchus or lung: Secondary | ICD-10-CM

## 2021-05-17 LAB — CBC WITH DIFFERENTIAL (CANCER CENTER ONLY)
Abs Immature Granulocytes: 0.02 10*3/uL (ref 0.00–0.07)
Basophils Absolute: 0.1 10*3/uL (ref 0.0–0.1)
Basophils Relative: 1 %
Eosinophils Absolute: 0.3 10*3/uL (ref 0.0–0.5)
Eosinophils Relative: 3 %
HCT: 32.4 % — ABNORMAL LOW (ref 39.0–52.0)
Hemoglobin: 9.8 g/dL — ABNORMAL LOW (ref 13.0–17.0)
Immature Granulocytes: 0 %
Lymphocytes Relative: 31 %
Lymphs Abs: 2.4 10*3/uL (ref 0.7–4.0)
MCH: 24.8 pg — ABNORMAL LOW (ref 26.0–34.0)
MCHC: 30.2 g/dL (ref 30.0–36.0)
MCV: 82 fL (ref 80.0–100.0)
Monocytes Absolute: 0.9 10*3/uL (ref 0.1–1.0)
Monocytes Relative: 12 %
Neutro Abs: 4.2 10*3/uL (ref 1.7–7.7)
Neutrophils Relative %: 53 %
Platelet Count: 271 10*3/uL (ref 150–400)
RBC: 3.95 MIL/uL — ABNORMAL LOW (ref 4.22–5.81)
RDW: 15.6 % — ABNORMAL HIGH (ref 11.5–15.5)
WBC Count: 7.9 10*3/uL (ref 4.0–10.5)
nRBC: 0 % (ref 0.0–0.2)

## 2021-05-17 LAB — CMP (CANCER CENTER ONLY)
ALT: 13 U/L (ref 0–44)
AST: 16 U/L (ref 15–41)
Albumin: 4.2 g/dL (ref 3.5–5.0)
Alkaline Phosphatase: 61 U/L (ref 38–126)
Anion gap: 8 (ref 5–15)
BUN: 20 mg/dL (ref 8–23)
CO2: 26 mmol/L (ref 22–32)
Calcium: 9.9 mg/dL (ref 8.9–10.3)
Chloride: 104 mmol/L (ref 98–111)
Creatinine: 1.7 mg/dL — ABNORMAL HIGH (ref 0.61–1.24)
GFR, Estimated: 44 mL/min — ABNORMAL LOW (ref >60)
Glucose, Bld: 220 mg/dL — ABNORMAL HIGH (ref 70–99)
Potassium: 4.3 mmol/L (ref 3.5–5.1)
Sodium: 138 mmol/L (ref 135–145)
Total Bilirubin: 0.3 mg/dL (ref 0.3–1.2)
Total Protein: 7.8 g/dL (ref 6.5–8.1)

## 2021-05-17 LAB — TSH: TSH: 0.771 u[IU]/mL (ref 0.320–4.118)

## 2021-05-17 MED ORDER — HEPARIN SOD (PORK) LOCK FLUSH 100 UNIT/ML IV SOLN
500.0000 [IU] | Freq: Once | INTRAVENOUS | Status: AC | PRN
  Administered 2021-05-17: 12:00:00 500 [IU]

## 2021-05-17 MED ORDER — SODIUM CHLORIDE 0.9% FLUSH
10.0000 mL | Freq: Once | INTRAVENOUS | Status: AC
  Administered 2021-05-17: 09:00:00 10 mL

## 2021-05-17 MED ORDER — SODIUM CHLORIDE 0.9 % IV SOLN: Freq: Once | INTRAVENOUS | Status: AC

## 2021-05-17 MED ORDER — SODIUM CHLORIDE 0.9% FLUSH
10.0000 mL | INTRAVENOUS | Status: DC | PRN
  Administered 2021-05-17: 12:00:00 10 mL

## 2021-05-17 MED ORDER — SODIUM CHLORIDE 0.9 % IV SOLN
200.0000 mg | Freq: Once | INTRAVENOUS | Status: AC
  Administered 2021-05-17: 11:00:00 200 mg via INTRAVENOUS
  Filled 2021-05-17: qty 8

## 2021-05-31 ENCOUNTER — Ambulatory Visit (INDEPENDENT_AMBULATORY_CARE_PROVIDER_SITE_OTHER): Payer: Medicare Other | Admitting: Nurse Practitioner

## 2021-05-31 ENCOUNTER — Other Ambulatory Visit: Payer: Self-pay

## 2021-05-31 ENCOUNTER — Encounter: Payer: Self-pay | Admitting: Nurse Practitioner

## 2021-05-31 VITALS — BP 116/57 | HR 85 | Temp 97.5°F | Ht 75.0 in | Wt 233.0 lb

## 2021-05-31 DIAGNOSIS — G620 Drug-induced polyneuropathy: Secondary | ICD-10-CM | POA: Diagnosis not present

## 2021-05-31 DIAGNOSIS — C787 Secondary malignant neoplasm of liver and intrahepatic bile duct: Secondary | ICD-10-CM | POA: Diagnosis not present

## 2021-05-31 DIAGNOSIS — D702 Other drug-induced agranulocytosis: Secondary | ICD-10-CM | POA: Diagnosis not present

## 2021-05-31 DIAGNOSIS — T451X5A Adverse effect of antineoplastic and immunosuppressive drugs, initial encounter: Secondary | ICD-10-CM

## 2021-05-31 DIAGNOSIS — E785 Hyperlipidemia, unspecified: Secondary | ICD-10-CM

## 2021-05-31 DIAGNOSIS — Z5320 Procedure and treatment not carried out because of patient's decision for unspecified reasons: Secondary | ICD-10-CM

## 2021-05-31 DIAGNOSIS — I4892 Unspecified atrial flutter: Secondary | ICD-10-CM

## 2021-05-31 DIAGNOSIS — I10 Essential (primary) hypertension: Secondary | ICD-10-CM

## 2021-05-31 DIAGNOSIS — E1151 Type 2 diabetes mellitus with diabetic peripheral angiopathy without gangrene: Secondary | ICD-10-CM | POA: Diagnosis not present

## 2021-05-31 DIAGNOSIS — Z125 Encounter for screening for malignant neoplasm of prostate: Secondary | ICD-10-CM

## 2021-05-31 LAB — POCT GLYCOSYLATED HEMOGLOBIN (HGB A1C)
HbA1c POC (<> result, manual entry): 9 % (ref 4.0–5.6)
HbA1c, POC (controlled diabetic range): 9 % — AB (ref 0.0–7.0)
HbA1c, POC (prediabetic range): 9 % — AB (ref 5.7–6.4)
Hemoglobin A1C: 9 % — AB (ref 4.0–5.6)

## 2021-05-31 NOTE — Progress Notes (Signed)
Kirk [name], [place]  [address] Phone:  [phone]   Fax:  [phone]   Established Patient Office Visit  Subjective:  Patient ID: Kirk [name], male    DOB: 14-Mar-1956  Age: 66 y.o. MRN: [id number]  CC:  Chief Complaint  Patient presents with   Follow-up    Pt is here for his 3 month follow up. Pt states he has been having pains in both shoulders.        HPI Kirk [name] presents for follow up. He  has a past medical history of Arthritis, Atrial flutter with rapid ventricular response ([facility]) (04/19/2019), Dizziness (10/2019), Dyspnea, Elevated serum creatinine (07/2020), HTN (hypertension), Hyperlipidemia, Lung cancer ([facility]) (05/2019), Microalbuminuria (07/2020), NSTEMI (non-ST elevated myocardial infarction) ([facility]) (09/04/2015), PAD (peripheral artery disease) ([facility]), Refusal of blood transfusions as patient is Jehovah's Witness, Type II diabetes mellitus ([facility]), and Vitamin D deficiency (07/2020).   He is in today for his 3 month follow up. He reports that he had to restart metformin. This was discontinued due to recommendation from nephrology related to renal function. He states that his CBG was > than 300. So he restarted this about 3 weeks ago. He endorses his love for sweet treats with little debbie raisin cakes. He also enjoys breakfast biscuits or muffins. His current A1c 9.0% increased from 7.2%. He is aware of the changes needed. He would like to continue on the metformin despite the reason for the discontinuing. He is not interested in dialysis if this would ever be needed.   He also complains of shoulder pain;right is greater than the left. He has decreased ROM with lifting and certain movements. He take APAP but does not feel like it is as effective as NSAIDs which is aware to avoid. He denies pain at night with lying down. Denies numbness or tingling.   He has been able to eat seafood without any reactions.   He denies any  other new concerns today.   Past Medical History:  Diagnosis Date   Arthritis    "right knee" (09/04/2015)   Atrial flutter with rapid ventricular response ([facility]) 04/19/2019   Dizziness 10/2019   Dyspnea    increased exertion   Elevated serum creatinine 07/2020   HTN (hypertension)    Hyperlipidemia    Lung cancer ([facility]) 05/2019   Microalbuminuria 07/2020   NSTEMI (non-ST elevated myocardial infarction) ([facility]) 09/04/2015   a. cath 09/05/2015: 95% stenosis mid-LAD, 55% mid-RCA, and 40% prox-RCA. PCI performed w/ Synergy DES to LAD   PAD (peripheral artery disease) ([facility])    Stenting of bilateral iliacs in 2003.   Refusal of blood transfusions as patient is Jehovah's Witness    Type II diabetes mellitus ([facility])    Vitamin D deficiency 07/2020    Past Surgical History:  Procedure Laterality Date   CARDIAC CATHETERIZATION N/A 09/05/2015   Procedure: Left Heart Cath and Coronary Angiography;  Surgeon: Troy Sine, MD;  Location: [facility];  Service: Cardiovascular;  Laterality: N/A;   CARDIAC CATHETERIZATION N/A 09/05/2015   Procedure: Coronary Stent Intervention;  Surgeon: Troy Sine, MD;  Location: [facility];  Service: Cardiovascular;  Laterality: N/A;   IR IMAGING GUIDED PORT INSERTION  07/15/2019   ORIF CONGENITAL HIP DISLOCATION Left ~ 1965   "put pins in it"   PERIPHERAL VASCULAR CATHETERIZATION Bilateral 2003   "stenting"   TONSILLECTOMY  ~ 1963    Family History  Problem  Relation Age of Onset   Lymphoma Mother    Cancer - Prostate Father    CAD Neg Hx    Colon cancer Neg Hx    Rectal cancer Neg Hx    Stomach cancer Neg Hx    Esophageal cancer Neg Hx     Social History   Socioeconomic History   Marital status: Married    Spouse name: Not on file   Number of children: 1   Years of education: Not on file   Highest education level: Not on file  Occupational History   Occupation: Furniture salesman  Tobacco Use   Smoking status: Former     Packs/day: 2.00    Years: 35.00    Pack years: 70.00    Types: Cigarettes    Quit date: 02/19/2003    Years since quitting: 18.2   Smokeless tobacco: Never  Vaping Use   Vaping Use: Never used  Substance and Sexual Activity   Alcohol use: Not Currently    Alcohol/week: 0.0 standard drinks   Drug use: Not Currently    Types: Marijuana   Sexual activity: Not Currently  Other Topics Concern   Not on file  Social History Narrative   Not on file   Social Determinants of Health   Financial Resource Strain: Low Risk    Difficulty of Paying Living Expenses: Not hard at all  Food Insecurity: No Food Insecurity   Worried About Charity fundraiser in the Last Year: Never true   Ran Out of Food in the Last Year: Never true  Transportation Needs: No Transportation Needs   Lack of Transportation (Medical): No   Lack of Transportation (Non-Medical): No  Physical Activity: Sufficiently Active   Days of Exercise per Week: 5 days   Minutes of Exercise per Session: 150+ min  Stress: No Stress Concern Present   Feeling of Stress : Not at all  Social Connections: Moderately Integrated   Frequency of Communication with Friends and Family: More than three times a week   Frequency of Social Gatherings with Friends and Family: More than three times a week   Attends Religious Services: More than 4 times per year   Active Member of [employer] or Organizations: No   Attends Archivist Meetings: Never   Marital Status: Married  Human resources officer Violence: Not At Risk   Fear of Current or Ex-Partner: No   Emotionally Abused: No   Physically Abused: No   Sexually Abused: No    Outpatient Medications Prior to Visit  Medication Sig Dispense Refill   acetaminophen (TYLENOL) 500 MG tablet Take 500 mg by mouth every 6 (six) hours as needed for moderate pain or fever.      aspirin EC 81 MG tablet Take 81 mg by mouth at bedtime.     blood glucose meter kit and supplies Dispense based on patient and  insurance preference. Use up to four times daily as directed. (FOR ICD-10 E10.9, E11.9). 1 each 0   carvedilol (COREG) 12.5 MG tablet Take 1 tablet (12.5 mg total) by mouth 2 (two) times daily with a meal. 180 tablet 3   clopidogrel (PLAVIX) 75 MG tablet Take 1 tablet (75 mg total) by mouth daily. 90 tablet 3   docusate sodium (COLACE) 100 MG capsule Take 1 capsule (100 mg total) by mouth 2 (two) times daily as needed for mild constipation. 180 capsule 3   gabapentin (NEURONTIN) 300 MG capsule Take 1 capsule (300 mg total) by mouth 3 (  three) times daily. 270 capsule 3   glimepiride (AMARYL) 4 MG tablet Take 1 tablet (4 mg total) by mouth daily. 90 tablet 3   glucose blood (ACCU-CHEK GUIDE) test strip Use as instructed up to four times daily as directed. 100 each 12   lactulose (CHRONULAC) 10 GM/15ML solution Take 30 mLs (20 g total) by mouth 2 (two) times daily as needed for moderate constipation. 240 mL 11   lidocaine-prilocaine (EMLA) cream APPLY TO THE PORT-A-CATH SITE 30-60 MINUTES BEFORE TREATMENT. (Patient taking differently: Apply 1 application topically daily as needed (access port). Apply to the Port-A-Cath site 30-60 minutes before treatment.) 30 g 0   multivitamin (ONE-A-DAY MEN'S) TABS tablet Take 1 tablet by mouth daily. 30 tablet 6   nitroGLYCERIN (NITROSTAT) 0.4 MG SL tablet Place 1 tablet (0.4 mg total) under the tongue every 5 (five) minutes x 3 doses as needed for chest pain. 25 tablet 2   rosuvastatin (CRESTOR) 40 MG tablet Take 1 tablet (40 mg total) by mouth daily. 90 tablet 3   benzonatate (TESSALON) 100 MG capsule Take 1 capsule (100 mg total) by mouth 3 (three) times daily as needed for cough. (Patient not taking: Reported on 05/31/2021) 30 capsule 0   cetirizine (ZYRTEC) 10 MG tablet Take 1 tablet (10 mg total) by mouth daily. (Patient not taking: Reported on 02/15/2021) 90 tablet 3   doxycycline (VIBRA-TABS) 100 MG tablet Take 1 tablet (100 mg total) by mouth 2 (two) times daily.  (Patient not taking: Reported on 05/31/2021) 14 tablet 0   ferrous sulfate 324 MG TBEC Take 1 tablet (324 mg total) by mouth daily with breakfast. (Patient not taking: Reported on 02/15/2021) 30 tablet 11   Hydrocortisone Acetate 1 % CREA Apply 15 g topically 2 (two) times daily. (Patient not taking: Reported on 02/15/2021) 15 g 3   metFORMIN (GLUCOPHAGE) 1000 MG tablet Take 1,000 mg by mouth 2 (two) times daily. (Patient not taking: Reported on 05/31/2021)     No facility-administered medications prior to visit.    No Known Allergies  ROS Review of Systems  Constitutional:  Negative for chills and fever.  HENT:         Cough occasional with      Objective:    Physical Exam Constitutional:      Appearance: He is obese.  HENT:     Head: Normocephalic and atraumatic.     Nose: Nose normal.     Mouth/Throat:     Mouth: Mucous membranes are moist.  Cardiovascular:     Rate and Rhythm: Normal rate and regular rhythm.     Pulses: Normal pulses.     Heart sounds: Normal heart sounds.  Pulmonary:     Effort: Pulmonary effort is normal.     Breath sounds: Normal breath sounds.  Abdominal:     Palpations: Abdomen is soft.  Musculoskeletal:        General: Tenderness (with abduction right arm) present.     Cervical back: Normal range of motion.  Skin:    General: Skin is warm and dry.     Capillary Refill: Capillary refill takes less than 2 seconds.  Neurological:     General: No focal deficit present.     Mental Status: He is alert and oriented to person, place, and time.  Psychiatric:        Mood and Affect: Mood normal.        Behavior: Behavior normal.        Thought  Content: Thought content normal.        Judgment: Judgment normal.    BP (!) 116/57    Pulse 85    Temp (!) 97.5 F (36.4 C)    Ht _0  (1.905 m)    Wt 233 lb (105.7 kg)    SpO2 98%    BMI 29.12 kg/m  Wt Readings from Last 3 Encounters:  05/31/21 233 lb (105.7 kg)  05/17/21 232 lb 14.4 oz (105.6 kg)   04/26/21 230 lb 3.2 oz (104.4 kg)     Health Maintenance Due  Topic Date Due   COVID-19 Vaccine (2 - Pfizer risk series) 02/08/2020   Pneumonia Vaccine 12+ Years old (2 - PPSV23 if available, else PCV20) 03/21/2020    There are no preventive care reminders to display for this patient.  Lab Results  Component Value Date   TSH 0.771 05/17/2021   Lab Results  Component Value Date   WBC 7.9 05/17/2021   HGB 9.8 (L) 05/17/2021   HCT 32.4 (L) 05/17/2021   MCV 82.0 05/17/2021   PLT 271 05/17/2021   Lab Results  Component Value Date   NA 138 05/17/2021   K 4.3 05/17/2021   CO2 26 05/17/2021   GLUCOSE 220 (H) 05/17/2021   BUN 20 05/17/2021   CREATININE 1.70 (H) 05/17/2021   BILITOT 0.3 05/17/2021   ALKPHOS 61 05/17/2021   AST 16 05/17/2021   ALT 13 05/17/2021   PROT 7.8 05/17/2021   ALBUMIN 4.2 05/17/2021   CALCIUM 9.9 05/17/2021   ANIONGAP 8 05/17/2021   GFR 101.07 05/31/2019   Lab Results  Component Value Date   CHOL 117 06/28/2020   Lab Results  Component Value Date   HDL 35 (L) 06/28/2020   Lab Results  Component Value Date   LDLCALC 59 06/28/2020   Lab Results  Component Value Date   TRIG 132 06/28/2020   Lab Results  Component Value Date   CHOLHDL 3.3 06/28/2020   Lab Results  Component Value Date   HGBA1C 9.0 (A) 05/31/2021   HGBA1C 9.0 05/31/2021   HGBA1C 9.0 (A) 05/31/2021   HGBA1C 9.0 (A) 05/31/2021      Assessment & Plan:   Problem List Items Addressed This Visit       Cardiovascular and Mediastinum   Diabetes mellitus with peripheral vascular disease ([facility]) - Primary Worsening  Encourage compliance with current treatment regimen  Dose adjustment declined additional adjustment. Will continue with metformin and glimepiride  Encourage regular CBG monitoring Encourage contacting office if excessive hyperglycemia and or hypoglycemia Lifestyle modification with healthy diet (fewer calories, more high fiber foods, whole grains and  non-starchy vegetables, lower fat meat and fish, low-fat diary include healthy oils) regular exercise (physical activity) and weight loss Home BP monitoring also encouraged goal <130/80    Relevant Medications   metFORMIN (GLUCOPHAGE) 1000 MG tablet   Other Relevant Orders   HgB A1c (Completed)   Paroxysmal atrial flutter ([facility] Stable  Continue with current regimen. Follow up with cardiology     Nervous and Auditory   Chemotherapy-induced neuropathy ([facility]CC) Stable      Other   Hyperlipidemia TC last year was in normal range. Continue with Crestor 40 mg  Declined labs today   Drug-induced neutropenia ([facility]) Resolved    Other Visit Diagnoses     Secondary malignant neoplasm of liver and intrahepatic bile duct ([facility])   (Chronic)     Screening for prostate cancer     Declined per  previous imaging no notable abnormalities   Essential hypertension     Stable  Continue with current regimen and follow up with cardiology as scheduled.     Blood test declined           No orders of the defined types were placed in this encounter.   Follow-up: Return in about 3 months (around 08/29/2021) for follow up [id number].    [name], NP

## 2021-05-31 NOTE — Patient Instructions (Signed)

## 2021-06-07 ENCOUNTER — Encounter: Payer: Self-pay | Admitting: Internal Medicine

## 2021-06-07 ENCOUNTER — Inpatient Hospital Stay: Payer: Medicare Other

## 2021-06-07 ENCOUNTER — Inpatient Hospital Stay (HOSPITAL_BASED_OUTPATIENT_CLINIC_OR_DEPARTMENT_OTHER): Payer: Medicare Other | Admitting: Internal Medicine

## 2021-06-07 ENCOUNTER — Inpatient Hospital Stay: Payer: Medicare Other | Attending: Internal Medicine

## 2021-06-07 ENCOUNTER — Other Ambulatory Visit: Payer: Self-pay

## 2021-06-07 VITALS — BP 130/53 | HR 88 | Temp 96.9°F | Resp 20 | Ht 75.0 in | Wt 230.1 lb

## 2021-06-07 DIAGNOSIS — C3491 Malignant neoplasm of unspecified part of right bronchus or lung: Secondary | ICD-10-CM | POA: Diagnosis not present

## 2021-06-07 DIAGNOSIS — C787 Secondary malignant neoplasm of liver and intrahepatic bile duct: Secondary | ICD-10-CM | POA: Diagnosis not present

## 2021-06-07 DIAGNOSIS — Z95828 Presence of other vascular implants and grafts: Secondary | ICD-10-CM

## 2021-06-07 DIAGNOSIS — Z5112 Encounter for antineoplastic immunotherapy: Secondary | ICD-10-CM | POA: Insufficient documentation

## 2021-06-07 DIAGNOSIS — R5383 Other fatigue: Secondary | ICD-10-CM

## 2021-06-07 DIAGNOSIS — Z79899 Other long term (current) drug therapy: Secondary | ICD-10-CM | POA: Insufficient documentation

## 2021-06-07 DIAGNOSIS — C3490 Malignant neoplasm of unspecified part of unspecified bronchus or lung: Secondary | ICD-10-CM

## 2021-06-07 LAB — CMP (CANCER CENTER ONLY)
ALT: 13 U/L (ref 0–44)
AST: 15 U/L (ref 15–41)
Albumin: 4 g/dL (ref 3.5–5.0)
Alkaline Phosphatase: 60 U/L (ref 38–126)
Anion gap: 8 (ref 5–15)
BUN: 21 mg/dL (ref 8–23)
CO2: 26 mmol/L (ref 22–32)
Calcium: 9.3 mg/dL (ref 8.9–10.3)
Chloride: 105 mmol/L (ref 98–111)
Creatinine: 1.8 mg/dL — ABNORMAL HIGH (ref 0.61–1.24)
GFR, Estimated: 41 mL/min — ABNORMAL LOW (ref >60)
Glucose, Bld: 279 mg/dL — ABNORMAL HIGH (ref 70–99)
Potassium: 4.2 mmol/L (ref 3.5–5.1)
Sodium: 139 mmol/L (ref 135–145)
Total Bilirubin: 0.3 mg/dL (ref 0.3–1.2)
Total Protein: 7.7 g/dL (ref 6.5–8.1)

## 2021-06-07 LAB — CBC WITH DIFFERENTIAL (CANCER CENTER ONLY)
Abs Immature Granulocytes: 0.01 10*3/uL (ref 0.00–0.07)
Basophils Absolute: 0 10*3/uL (ref 0.0–0.1)
Basophils Relative: 1 %
Eosinophils Absolute: 0.2 10*3/uL (ref 0.0–0.5)
Eosinophils Relative: 3 %
HCT: 30.8 % — ABNORMAL LOW (ref 39.0–52.0)
Hemoglobin: 9.7 g/dL — ABNORMAL LOW (ref 13.0–17.0)
Immature Granulocytes: 0 %
Lymphocytes Relative: 37 %
Lymphs Abs: 2.4 10*3/uL (ref 0.7–4.0)
MCH: 25.5 pg — ABNORMAL LOW (ref 26.0–34.0)
MCHC: 31.5 g/dL (ref 30.0–36.0)
MCV: 81.1 fL (ref 80.0–100.0)
Monocytes Absolute: 0.6 10*3/uL (ref 0.1–1.0)
Monocytes Relative: 9 %
Neutro Abs: 3.2 10*3/uL (ref 1.7–7.7)
Neutrophils Relative %: 50 %
Platelet Count: 254 10*3/uL (ref 150–400)
RBC: 3.8 MIL/uL — ABNORMAL LOW (ref 4.22–5.81)
RDW: 16.4 % — ABNORMAL HIGH (ref 11.5–15.5)
WBC Count: 6.4 10*3/uL (ref 4.0–10.5)
nRBC: 0 % (ref 0.0–0.2)

## 2021-06-07 LAB — TSH: TSH: 0.964 u[IU]/mL (ref 0.320–4.118)

## 2021-06-07 MED ORDER — SODIUM CHLORIDE 0.9 % IV SOLN: Freq: Once | INTRAVENOUS | Status: AC

## 2021-06-07 MED ORDER — SODIUM CHLORIDE 0.9% FLUSH
10.0000 mL | INTRAVENOUS | Status: DC | PRN
  Administered 2021-06-07: 10 mL

## 2021-06-07 MED ORDER — SODIUM CHLORIDE 0.9 % IV SOLN
200.0000 mg | Freq: Once | INTRAVENOUS | Status: AC
  Administered 2021-06-07: 200 mg via INTRAVENOUS
  Filled 2021-06-07: qty 8

## 2021-06-07 MED ORDER — SODIUM CHLORIDE 0.9% FLUSH
10.0000 mL | Freq: Once | INTRAVENOUS | Status: AC
  Administered 2021-06-07: 10 mL

## 2021-06-07 MED ORDER — HEPARIN SOD (PORK) LOCK FLUSH 100 UNIT/ML IV SOLN
500.0000 [IU] | Freq: Once | INTRAVENOUS | Status: AC | PRN
  Administered 2021-06-07: 500 [IU]

## 2021-06-07 NOTE — Progress Notes (Signed)
[facility] Telephone:([phone]   Fax:(336) [phone]  OFFICE PROGRESS NOTE  [name], NP [address] [name] [address]  DIAGNOSIS: Stage IV (TX, N3, M1c) non-small cell lung cancer, poorly differentiated adenocarcinoma presented with bulky mediastinal lymphadenopathy in addition to right axillary lymphadenopathy and metastatic liver lesions, abdominal lymphadenopathy diagnosed in January 2021. Molecular studies by [employer] that shows no actionable mutations.  PRIOR THERAPY: None  CURRENT THERAPY: Systemic chemotherapy with carboplatin for AUC of 5, Alimta 500 mg/M2 and Keytruda 200 mg IV every 3 weeks.  First dose July 14, 2019.  Status post 33  cycles. Starting from cycle #5 the patient will be treated with maintenance Alimta and Keytruda every 3 weeks.  Starting from cycle #12 he will be on single agent treatment with Keytruda secondary to renal insufficiency.  INTERVAL HISTORY: Kirk [name] 66 y.o. male returns to the clinic today for follow-up visit.  The patient is feeling fine today with no concerning complaints.  He denied having any chest pain, shortness of breath, cough or hemoptysis.  He denied having any fever or chills.  He has no nausea, vomiting, diarrhea or constipation he has no recent weight loss or night sweats.  He continues to have mild fatigue.  The patient is here today for evaluation before starting cycle #34.  MEDICAL HISTORY: Past Medical History:  Diagnosis Date   Arthritis    "right knee" (09/04/2015)   Atrial flutter with rapid ventricular response ([facility]) 04/19/2019   Dizziness 10/2019   Dyspnea    increased exertion   Elevated serum creatinine 07/2020   HTN (hypertension)    Hyperlipidemia    Lung cancer ([facility]) 05/2019   Microalbuminuria 07/2020   NSTEMI (non-ST elevated myocardial infarction) ([facility]) 09/04/2015   a. cath 09/05/2015: 95% stenosis mid-LAD, 55% mid-RCA, and 40% prox-RCA. PCI performed w/  Synergy DES to LAD   PAD (peripheral artery disease) ([facility])    Stenting of bilateral iliacs in 2003.   Refusal of blood transfusions as patient is Jehovah's Witness    Type II diabetes mellitus ([facility])    Vitamin D deficiency 07/2020    ALLERGIES:  has No Known Allergies.  MEDICATIONS:  Current Outpatient Medications  Medication Sig Dispense Refill   acetaminophen (TYLENOL) 500 MG tablet Take 500 mg by mouth every 6 (six) hours as needed for moderate pain or fever.      aspirin EC 81 MG tablet Take 81 mg by mouth at bedtime.     blood glucose meter kit and supplies Dispense based on patient and insurance preference. Use up to four times daily as directed. (FOR ICD-10 E10.9, E11.9). 1 each 0   carvedilol (COREG) 12.5 MG tablet Take 1 tablet (12.5 mg total) by mouth 2 (two) times daily with a meal. 180 tablet 3   clopidogrel (PLAVIX) 75 MG tablet Take 1 tablet (75 mg total) by mouth daily. 90 tablet 3   docusate sodium (COLACE) 100 MG capsule Take 1 capsule (100 mg total) by mouth 2 (two) times daily as needed for mild constipation. 180 capsule 3   ferrous sulfate 324 MG TBEC Take 1 tablet (324 mg total) by mouth daily with breakfast. 30 tablet 11   gabapentin (NEURONTIN) 300 MG capsule Take 1 capsule (300 mg total) by mouth 3 (three) times daily. 270 capsule 3   glimepiride (AMARYL) 4 MG tablet Take 1 tablet (4 mg total) by mouth daily. 90 tablet 3  glucose blood (ACCU-CHEK GUIDE) test strip Use as instructed up to four times daily as directed. 100 each 12   lidocaine-prilocaine (EMLA) cream APPLY TO THE PORT-A-CATH SITE 30-60 MINUTES BEFORE TREATMENT. (Patient taking differently: Apply 1 application topically daily as needed (access port). Apply to the Port-A-Cath site 30-60 minutes before treatment.) 30 g 0   multivitamin (ONE-A-DAY MEN'S) TABS tablet Take 1 tablet by mouth daily. 30 tablet 6   rosuvastatin (CRESTOR) 40 MG tablet Take 1 tablet (40 mg total) by mouth daily. 90 tablet 3    benzonatate (TESSALON) 100 MG capsule Take 1 capsule (100 mg total) by mouth 3 (three) times daily as needed for cough. (Patient not taking: Reported on 05/31/2021) 30 capsule 0   cetirizine (ZYRTEC) 10 MG tablet Take 1 tablet (10 mg total) by mouth daily. (Patient not taking: Reported on 02/15/2021) 90 tablet 3   Hydrocortisone Acetate 1 % CREA Apply 15 g topically 2 (two) times daily. (Patient not taking: Reported on 02/15/2021) 15 g 3   lactulose (CHRONULAC) 10 GM/15ML solution Take 30 mLs (20 g total) by mouth 2 (two) times daily as needed for moderate constipation. (Patient not taking: Reported on 06/07/2021) 240 mL 11   metFORMIN (GLUCOPHAGE) 1000 MG tablet Take 1,000 mg by mouth 2 (two) times daily. (Patient not taking: Reported on 05/31/2021)     nitroGLYCERIN (NITROSTAT) 0.4 MG SL tablet Place 1 tablet (0.4 mg total) under the tongue every 5 (five) minutes x 3 doses as needed for chest pain. (Patient not taking: Reported on 06/07/2021) 25 tablet 2   No current facility-administered medications for this visit.    SURGICAL HISTORY:  Past Surgical History:  Procedure Laterality Date   CARDIAC CATHETERIZATION N/A 09/05/2015   Procedure: Left Heart Cath and Coronary Angiography;  Surgeon: Troy Sine, MD;  Location: [facility];  Service: Cardiovascular;  Laterality: N/A;   CARDIAC CATHETERIZATION N/A 09/05/2015   Procedure: Coronary Stent Intervention;  Surgeon: Troy Sine, MD;  Location: [facility];  Service: Cardiovascular;  Laterality: N/A;   IR IMAGING GUIDED PORT INSERTION  07/15/2019   ORIF CONGENITAL HIP DISLOCATION Left ~ 1965   "put pins in it"   PERIPHERAL VASCULAR CATHETERIZATION Bilateral 2003   "stenting"   TONSILLECTOMY  ~ [place]:  A comprehensive review of systems was negative except for: Constitutional: positive for fatigue Musculoskeletal: positive for arthralgias   PHYSICAL EXAMINATION: General appearance: alert, cooperative, appears  stated age, fatigued, and no distress Head: Normocephalic, without obvious abnormality, atraumatic Neck: no adenopathy, no JVD, supple, symmetrical, trachea midline, and thyroid not enlarged, symmetric, no tenderness/mass/nodules Lymph nodes: Cervical, supraclavicular, and axillary nodes normal. Resp: clear to auscultation bilaterally Back: symmetric, no curvature. ROM normal. No CVA tenderness. Cardio: regular rate and rhythm, S1, S2 normal, no murmur, click, rub or gallop GI: soft, non-tender; bowel sounds normal; no masses,  no organomegaly Extremities: extremities normal, atraumatic, no cyanosis or edema  ECOG PERFORMANCE STATUS: 1 - Symptomatic but completely ambulatory  Blood pressure (!) 130/53, pulse 88, temperature (!) 96.9 F (36.1 C), temperature source Tympanic, resp. rate 20, height 6' 3"  (1.905 m), weight 230 lb 1.6 oz (104.4 kg), SpO2 98 %.  LABORATORY DATA: Lab Results  Component Value Date   WBC 6.4 06/07/2021   HGB 9.7 (L) 06/07/2021   HCT 30.8 (L) 06/07/2021   MCV 81.1 06/07/2021   PLT 254 06/07/2021      Chemistry  Component Value Date/Time   NA 139 06/07/2021 0902   NA 140 06/08/2015 0840   K 4.2 06/07/2021 0902   CL 105 06/07/2021 0902   CO2 26 06/07/2021 0902   BUN 21 06/07/2021 0902   BUN 15 06/08/2015 0840   CREATININE 1.80 (H) 06/07/2021 0902   CREATININE 1.06 10/01/2016 0855      Component Value Date/Time   CALCIUM 9.3 06/07/2021 0902   ALKPHOS 60 06/07/2021 0902   AST 15 06/07/2021 0902   ALT 13 06/07/2021 0902   BILITOT 0.3 06/07/2021 0902       RADIOGRAPHIC STUDIES: No results found.  ASSESSMENT AND PLAN: This is a very pleasant 67 years old African-American male recently diagnosed with a stage IV (TX, N3, M1c) non-small cell lung cancer, poorly differentiated adenocarcinoma presented with bulky mediastinal as well as right hilar, supraclavicular, abdominal and right axillary lymphadenopathy in addition to metastatic liver lesions  diagnosed in January 2021. Molecular studies by [employer] shows no actionable mutations.  The patient is not a candidate for treatment with targeted therapy or enrollment in the clinical trial with the [facility] regimen. The patient is currently undergoing systemic chemotherapy with carboplatin for AUC of 5, Alimta 500 mg/M2 and Keytruda 200 mg IV every 3 weeks status post 33 cycles. Starting from cycle #5 he is treated with maintenance Alimta and Keytruda every 3 weeks.  Starting from cycle #12 the patient has been on treatment with single agent Keytruda because of the renal insufficiency. The patient continues to tolerate his treatment well with no concerning adverse effects. I recommended for him to proceed with cycle #34 today as planned. I will see him back for follow-up visit in 3 weeks for evaluation before starting cycle #35 which could be the last cycle of his treatment if no concerning findings for progression. For the anemia of chronic disease, he will continue on oral iron tablets and will continue to monitor his hemoglobin and hematocrit closely. For the renal insufficiency he is followed by nephrology with Dr. Joelyn Oms The patient was advised to call immediately if he has any concerning symptoms in the interval The patient voices understanding of current disease status and treatment options and is in agreement with the current care plan.  All questions were answered. The patient knows to call the clinic with any problems, questions or concerns. We can certainly see the patient much sooner if necessary.  Disclaimer: This note was dictated with voice recognition software. Similar sounding words can inadvertently be transcribed and may not be corrected upon review.

## 2021-06-07 NOTE — Progress Notes (Signed)
Per Dr [name] ,it is okay to treat Kirk [name] today with  Keytruda and creatinine of 1.8.

## 2021-06-07 NOTE — Patient Instructions (Signed)
[facility]  Discharge Instructions: ?Thank you for choosing [facility] to provide your oncology and hematology care.  ? ?If you have a lab appointment with the Cancer Center, please go directly to the Cancer Center and check in at the registration area. ?  ?Wear comfortable clothing and clothing appropriate for easy access to any Portacath or PICC line.  ? ?We strive to give you quality time with your provider. You may need to reschedule your appointment if you arrive late (15 or more minutes).  Arriving late affects you and other patients whose appointments are after yours.  Also, if you miss three or more appointments without notifying the office, you may be dismissed from the clinic at the provider?s discretion.    ?  ?For prescription refill requests, have your pharmacy contact our office and allow 72 hours for refills to be completed.   ? ?Today you received the following chemotherapy and/or immunotherapy agents: Keytruda ?  ?To help prevent nausea and vomiting after your treatment, we encourage you to take your nausea medication as directed. ? ?BELOW ARE SYMPTOMS THAT SHOULD BE REPORTED IMMEDIATELY: ?*FEVER GREATER THAN 100.4 F (38 ?C) OR HIGHER ?*CHILLS OR SWEATING ?*NAUSEA AND VOMITING THAT IS NOT CONTROLLED WITH YOUR NAUSEA MEDICATION ?*UNUSUAL SHORTNESS OF BREATH ?*UNUSUAL BRUISING OR BLEEDING ?*URINARY PROBLEMS (pain or burning when urinating, or frequent urination) ?*BOWEL PROBLEMS (unusual diarrhea, constipation, pain near the anus) ?TENDERNESS IN MOUTH AND THROAT WITH OR WITHOUT PRESENCE OF ULCERS (sore throat, sores in mouth, or a toothache) ?UNUSUAL RASH, SWELLING OR PAIN  ?UNUSUAL VAGINAL DISCHARGE OR ITCHING  ? ?Items with * indicate a potential emergency and should be followed up as soon as possible or go to the Emergency Department if any problems should occur. ? ?Please show the CHEMOTHERAPY ALERT CARD or IMMUNOTHERAPY ALERT CARD at check-in to the  Emergency Department and triage nurse. ? ?Should you have questions after your visit or need to cancel or reschedule your appointment, please contact [facility]  Dept: [phone]  and follow the prompts.  Office hours are 8:00 a.m. to 4:30 p.m. Monday - Friday. Please note that voicemails left after 4:00 p.m. may not be returned until the following business day.  We are closed weekends and major holidays. You have access to a nurse at all times for urgent questions. Please call the main number to the clinic Dept: [phone] and follow the prompts. ? ? ?For any non-urgent questions, you may also contact your provider using MyChart. We now offer e-Visits for anyone 18 and older to request care online for non-urgent symptoms. For details visit [email or web address]. ?  ?Also download the MyChart app! Go to the app store, search "MyChart", open the app, select , and log in with your MyChart username and password. ? ?Due to Covid, a mask is required upon entering the hospital/clinic. If you do not have a mask, one will be given to you upon arrival. For doctor visits, patients may have 1 support person aged 18 or older with them. For treatment visits, patients cannot have anyone with them due to current Covid guidelines and our immunocompromised population.  ? ?

## 2021-06-28 ENCOUNTER — Inpatient Hospital Stay: Payer: Medicare Other

## 2021-06-28 ENCOUNTER — Other Ambulatory Visit: Payer: Self-pay

## 2021-06-28 ENCOUNTER — Other Ambulatory Visit: Payer: Self-pay | Admitting: Internal Medicine

## 2021-06-28 ENCOUNTER — Inpatient Hospital Stay (HOSPITAL_BASED_OUTPATIENT_CLINIC_OR_DEPARTMENT_OTHER): Payer: Medicare Other | Admitting: Internal Medicine

## 2021-06-28 ENCOUNTER — Inpatient Hospital Stay: Payer: Medicare Other | Attending: Internal Medicine

## 2021-06-28 VITALS — BP 138/65 | HR 86 | Temp 96.6°F | Resp 19 | Ht 75.0 in | Wt 230.0 lb

## 2021-06-28 DIAGNOSIS — C3491 Malignant neoplasm of unspecified part of right bronchus or lung: Secondary | ICD-10-CM

## 2021-06-28 DIAGNOSIS — C3490 Malignant neoplasm of unspecified part of unspecified bronchus or lung: Secondary | ICD-10-CM

## 2021-06-28 DIAGNOSIS — I10 Essential (primary) hypertension: Secondary | ICD-10-CM | POA: Insufficient documentation

## 2021-06-28 DIAGNOSIS — Z5112 Encounter for antineoplastic immunotherapy: Secondary | ICD-10-CM

## 2021-06-28 DIAGNOSIS — Z79899 Other long term (current) drug therapy: Secondary | ICD-10-CM | POA: Insufficient documentation

## 2021-06-28 DIAGNOSIS — K769 Liver disease, unspecified: Secondary | ICD-10-CM | POA: Diagnosis not present

## 2021-06-28 DIAGNOSIS — D638 Anemia in other chronic diseases classified elsewhere: Secondary | ICD-10-CM | POA: Diagnosis not present

## 2021-06-28 DIAGNOSIS — R5383 Other fatigue: Secondary | ICD-10-CM

## 2021-06-28 DIAGNOSIS — R590 Localized enlarged lymph nodes: Secondary | ICD-10-CM | POA: Insufficient documentation

## 2021-06-28 DIAGNOSIS — N289 Disorder of kidney and ureter, unspecified: Secondary | ICD-10-CM | POA: Insufficient documentation

## 2021-06-28 DIAGNOSIS — Z95828 Presence of other vascular implants and grafts: Secondary | ICD-10-CM

## 2021-06-28 LAB — CBC WITH DIFFERENTIAL (CANCER CENTER ONLY)
Abs Immature Granulocytes: 0.01 10*3/uL (ref 0.00–0.07)
Basophils Absolute: 0.1 10*3/uL (ref 0.0–0.1)
Basophils Relative: 1 %
Eosinophils Absolute: 0.2 10*3/uL (ref 0.0–0.5)
Eosinophils Relative: 3 %
HCT: 33.3 % — ABNORMAL LOW (ref 39.0–52.0)
Hemoglobin: 10.1 g/dL — ABNORMAL LOW (ref 13.0–17.0)
Immature Granulocytes: 0 %
Lymphocytes Relative: 35 %
Lymphs Abs: 2.2 10*3/uL (ref 0.7–4.0)
MCH: 24.6 pg — ABNORMAL LOW (ref 26.0–34.0)
MCHC: 30.3 g/dL (ref 30.0–36.0)
MCV: 81 fL (ref 80.0–100.0)
Monocytes Absolute: 0.5 10*3/uL (ref 0.1–1.0)
Monocytes Relative: 9 %
Neutro Abs: 3.2 10*3/uL (ref 1.7–7.7)
Neutrophils Relative %: 52 %
Platelet Count: 287 10*3/uL (ref 150–400)
RBC: 4.11 MIL/uL — ABNORMAL LOW (ref 4.22–5.81)
RDW: 17.2 % — ABNORMAL HIGH (ref 11.5–15.5)
WBC Count: 6.1 10*3/uL (ref 4.0–10.5)
nRBC: 0 % (ref 0.0–0.2)

## 2021-06-28 LAB — CMP (CANCER CENTER ONLY)
ALT: 13 U/L (ref 0–44)
AST: 14 U/L — ABNORMAL LOW (ref 15–41)
Albumin: 4.3 g/dL (ref 3.5–5.0)
Alkaline Phosphatase: 72 U/L (ref 38–126)
Anion gap: 6 (ref 5–15)
BUN: 21 mg/dL (ref 8–23)
CO2: 27 mmol/L (ref 22–32)
Calcium: 9.5 mg/dL (ref 8.9–10.3)
Chloride: 104 mmol/L (ref 98–111)
Creatinine: 1.58 mg/dL — ABNORMAL HIGH (ref 0.61–1.24)
GFR, Estimated: 48 mL/min — ABNORMAL LOW (ref >60)
Glucose, Bld: 248 mg/dL — ABNORMAL HIGH (ref 70–99)
Potassium: 4.2 mmol/L (ref 3.5–5.1)
Sodium: 137 mmol/L (ref 135–145)
Total Bilirubin: 0.3 mg/dL (ref 0.3–1.2)
Total Protein: 8 g/dL (ref 6.5–8.1)

## 2021-06-28 LAB — TSH: TSH: 1.113 u[IU]/mL (ref 0.320–4.118)

## 2021-06-28 MED ORDER — SODIUM CHLORIDE 0.9 % IV SOLN
200.0000 mg | Freq: Once | INTRAVENOUS | Status: AC
  Administered 2021-06-28: 200 mg via INTRAVENOUS
  Filled 2021-06-28: qty 8

## 2021-06-28 MED ORDER — HEPARIN SOD (PORK) LOCK FLUSH 100 UNIT/ML IV SOLN: 500.0000 [IU] | Freq: Once | INTRAVENOUS | Status: DC | PRN

## 2021-06-28 MED ORDER — SODIUM CHLORIDE 0.9% FLUSH
10.0000 mL | Freq: Once | INTRAVENOUS | Status: AC
  Administered 2021-06-28: 10 mL

## 2021-06-28 MED ORDER — SODIUM CHLORIDE 0.9% FLUSH: 10.0000 mL | INTRAVENOUS | Status: DC | PRN

## 2021-06-28 MED ORDER — SODIUM CHLORIDE 0.9 % IV SOLN: Freq: Once | INTRAVENOUS | Status: AC

## 2021-06-28 NOTE — Progress Notes (Signed)
[facility] [phone]   [phone]  OFFICE PROGRESS NOTE  Kirk Francois, NP [address] [name] [address]  DIAGNOSIS: Stage IV (TX, N3, M1c) non-small cell lung cancer, poorly differentiated adenocarcinoma presented with bulky mediastinal lymphadenopathy in addition to right axillary lymphadenopathy and metastatic liver lesions, abdominal lymphadenopathy diagnosed in January 2021. Molecular studies by Guardant 360 that shows no actionable mutations.  PRIOR THERAPY: None  CURRENT THERAPY: Systemic chemotherapy with carboplatin for AUC of 5, Alimta 500 mg/M2 and Keytruda 200 mg IV every 3 weeks.  First dose July 14, 2019.  Status post 33  cycles. Starting from cycle #5 the patient will be treated with maintenance Alimta and Keytruda every 3 weeks.  Starting from cycle #12 he will be on single agent treatment with Keytruda secondary to renal insufficiency.  INTERVAL HISTORY: Kirk [name] 66 y.o. male returns to the clinic today for follow-up visit.  The patient has no complaints today except for mild fatigue.  He denied having any current chest pain, shortness of breath, cough or hemoptysis.  He denied having any fever or chills.  He has no nausea, vomiting, diarrhea or constipation.  He has no headache or visual changes.  He has been tolerating his treatment with single agent Keytruda fairly well.  The patient is here today for evaluation before starting cycle #35 of his treatment.  MEDICAL HISTORY: Past Medical History:  Diagnosis Date   Arthritis    "right knee" (09/04/2015)   Atrial flutter with rapid ventricular response ([facility]) 04/19/2019   Dizziness 10/2019   Dyspnea    increased exertion   Elevated serum creatinine 07/2020   HTN (hypertension)    Hyperlipidemia    Lung cancer ([facility]) 05/2019   Microalbuminuria 07/2020   NSTEMI (non-ST elevated myocardial infarction) ([facility]) 09/04/2015   a. cath 09/05/2015: 95% stenosis  mid-LAD, 55% mid-RCA, and 40% prox-RCA. PCI performed w/ Synergy DES to LAD   PAD (peripheral artery disease) ([facility])    Stenting of bilateral iliacs in 2003.   Refusal of blood transfusions as patient is Jehovah's Witness    Type II diabetes mellitus ([facility])    Vitamin D deficiency 07/2020    ALLERGIES:  has No Known Allergies.  MEDICATIONS:  Current Outpatient Medications  Medication Sig Dispense Refill   acetaminophen (TYLENOL) 500 MG tablet Take 500 mg by mouth every 6 (six) hours as needed for moderate pain or fever.      aspirin EC 81 MG tablet Take 81 mg by mouth at bedtime.     benzonatate (TESSALON) 100 MG capsule Take 1 capsule (100 mg total) by mouth 3 (three) times daily as needed for cough. (Patient not taking: Reported on 05/31/2021) 30 capsule 0   blood glucose meter kit and supplies Dispense based on patient and insurance preference. Use up to four times daily as directed. (FOR ICD-10 E10.9, E11.9). 1 each 0   carvedilol (COREG) 12.5 MG tablet Take 1 tablet (12.5 mg total) by mouth 2 (two) times daily with a meal. 180 tablet 3   cetirizine (ZYRTEC) 10 MG tablet Take 1 tablet (10 mg total) by mouth daily. (Patient not taking: Reported on 02/15/2021) 90 tablet 3   clopidogrel (PLAVIX) 75 MG tablet Take 1 tablet (75 mg total) by mouth daily. 90 tablet 3   docusate sodium (COLACE) 100 MG capsule Take 1 capsule (100 mg total) by mouth 2 (two) times daily as needed for mild constipation. [place]  capsule 3   ferrous sulfate 324 MG TBEC Take 1 tablet (324 mg total) by mouth daily with breakfast. 30 tablet 11   gabapentin (NEURONTIN) 300 MG capsule Take 1 capsule (300 mg total) by mouth 3 (three) times daily. 270 capsule 3   glimepiride (AMARYL) 4 MG tablet Take 1 tablet (4 mg total) by mouth daily. 90 tablet 3   glucose blood (ACCU-CHEK GUIDE) test strip Use as instructed up to four times daily as directed. 100 each 12   Hydrocortisone Acetate 1 % CREA Apply 15 g topically 2 (two) times daily.  (Patient not taking: Reported on 02/15/2021) 15 g 3   lactulose (CHRONULAC) 10 GM/15ML solution Take 30 mLs (20 g total) by mouth 2 (two) times daily as needed for moderate constipation. (Patient not taking: Reported on 06/07/2021) 240 mL 11   lidocaine-prilocaine (EMLA) cream APPLY TO THE PORT-A-CATH SITE 30-60 MINUTES BEFORE TREATMENT. (Patient taking differently: Apply 1 application topically daily as needed (access port). Apply to the Port-A-Cath site 30-60 minutes before treatment.) 30 g 0   metFORMIN (GLUCOPHAGE) 1000 MG tablet Take 1,000 mg by mouth 2 (two) times daily. (Patient not taking: Reported on 05/31/2021)     multivitamin (ONE-A-DAY MEN'S) TABS tablet Take 1 tablet by mouth daily. 30 tablet 6   nitroGLYCERIN (NITROSTAT) 0.4 MG SL tablet Place 1 tablet (0.4 mg total) under the tongue every 5 (five) minutes x 3 doses as needed for chest pain. (Patient not taking: Reported on 06/07/2021) 25 tablet 2   rosuvastatin (CRESTOR) 40 MG tablet Take 1 tablet (40 mg total) by mouth daily. 90 tablet 3   No current facility-administered medications for this visit.    SURGICAL HISTORY:  Past Surgical History:  Procedure Laterality Date   CARDIAC CATHETERIZATION N/A 09/05/2015   Procedure: Left Heart Cath and Coronary Angiography;  Surgeon: Troy Sine, MD;  Location: [facility];  Service: Cardiovascular;  Laterality: N/A;   CARDIAC CATHETERIZATION N/A 09/05/2015   Procedure: Coronary Stent Intervention;  Surgeon: Troy Sine, MD;  Location: [facility];  Service: Cardiovascular;  Laterality: N/A;   IR IMAGING GUIDED PORT INSERTION  07/15/2019   ORIF CONGENITAL HIP DISLOCATION Left ~ 1965   "put pins in it"   PERIPHERAL VASCULAR CATHETERIZATION Bilateral 2003   "stenting"   TONSILLECTOMY  ~ [place]:  A comprehensive review of systems was negative except for: Constitutional: positive for fatigue   PHYSICAL EXAMINATION: General appearance: alert, cooperative,  appears stated age, fatigued, and no distress Head: Normocephalic, without obvious abnormality, atraumatic Neck: no adenopathy, no JVD, supple, symmetrical, trachea midline, and thyroid not enlarged, symmetric, no tenderness/mass/nodules Lymph nodes: Cervical, supraclavicular, and axillary nodes normal. Resp: clear to auscultation bilaterally Back: symmetric, no curvature. ROM normal. No CVA tenderness. Cardio: regular rate and rhythm, S1, S2 normal, no murmur, click, rub or gallop GI: soft, non-tender; bowel sounds normal; no masses,  no organomegaly Extremities: extremities normal, atraumatic, no cyanosis or edema  ECOG PERFORMANCE STATUS: 1 - Symptomatic but completely ambulatory  Blood pressure 138/65, pulse 86, temperature (!) 96.6 F (35.9 C), temperature source Tympanic, resp. rate 19, height _0  (1.905 m), weight 230 lb (104.3 kg), SpO2 100 %.  LABORATORY DATA: Lab Results  Component Value Date   WBC 6.1 06/28/2021   HGB 10.1 (L) 06/28/2021   HCT 33.3 (L) 06/28/2021   MCV 81.0 06/28/2021   PLT 287 06/28/2021      Chemistry  Component Value Date/Time   NA 139 06/07/2021 0902   NA 140 06/08/2015 0840   K 4.2 06/07/2021 0902   CL 105 06/07/2021 0902   CO2 26 06/07/2021 0902   BUN 21 06/07/2021 0902   BUN 15 06/08/2015 0840   CREATININE 1.80 (H) 06/07/2021 0902   CREATININE 1.06 10/01/2016 0855      Component Value Date/Time   CALCIUM 9.3 06/07/2021 0902   ALKPHOS 60 06/07/2021 0902   AST 15 06/07/2021 0902   ALT 13 06/07/2021 0902   BILITOT 0.3 06/07/2021 0902       RADIOGRAPHIC STUDIES: No results found.  ASSESSMENT AND PLAN: This is a very pleasant 66 years old African-American male recently diagnosed with a stage IV (TX, N3, M1c) non-small cell lung cancer, poorly differentiated adenocarcinoma presented with bulky mediastinal as well as right hilar, supraclavicular, abdominal and right axillary lymphadenopathy in addition to metastatic liver lesions  diagnosed in January 2021. Molecular studies by guardant 360 shows no actionable mutations.  The patient is not a candidate for treatment with targeted therapy or enrollment in the clinical trial with the [facility] regimen. The patient is currently undergoing systemic chemotherapy with carboplatin for AUC of 5, Alimta 500 mg/M2 and Keytruda 200 mg IV every 3 weeks status post 34 cycles. Starting from cycle #5 he is treated with maintenance Alimta and Keytruda every 3 weeks.  Starting from cycle #12 the patient has been on treatment with single agent Keytruda because of the renal insufficiency. The patient has been tolerating this treatment well with no concerning adverse effects. I recommended for him to proceed with cycle #35 today as planned. I will see him back for follow-up visit in 3 weeks for evaluation with repeat CT scan of the chest, abdomen and pelvis for restaging of his disease. I may consider stopping his treatment after this cycle if he has no concerning findings for disease progression on the upcoming scan. For the anemia of chronic disease, he will continue on oral iron tablets and will continue to monitor his hemoglobin and hematocrit closely. For the renal insufficiency he is followed by nephrology with Dr. Joelyn Oms The patient was advised to call immediately if he has any other concerning symptoms in the interval. The patient voices understanding of current disease status and treatment options and is in agreement with the current care plan.  All questions were answered. The patient knows to call the clinic with any problems, questions or concerns. We can certainly see the patient much sooner if necessary.  Disclaimer: This note was dictated with voice recognition software. Similar sounding words can inadvertently be transcribed and may not be corrected upon review.

## 2021-06-28 NOTE — Progress Notes (Signed)
Per MD OK to trt w/ SCR 1.58

## 2021-06-28 NOTE — Patient Instructions (Signed)
[facility]  Discharge Instructions: ?Thank you for choosing [facility] to provide your oncology and hematology care.  ? ?If you have a lab appointment with the Cancer Center, please go directly to the Cancer Center and check in at the registration area. ?  ?Wear comfortable clothing and clothing appropriate for easy access to any Portacath or PICC line.  ? ?We strive to give you quality time with your provider. You may need to reschedule your appointment if you arrive late (15 or more minutes).  Arriving late affects you and other patients whose appointments are after yours.  Also, if you miss three or more appointments without notifying the office, you may be dismissed from the clinic at the provider?s discretion.    ?  ?For prescription refill requests, have your pharmacy contact our office and allow 72 hours for refills to be completed.   ? ?Today you received the following chemotherapy and/or immunotherapy agents: Keytruda ?  ?To help prevent nausea and vomiting after your treatment, we encourage you to take your nausea medication as directed. ? ?BELOW ARE SYMPTOMS THAT SHOULD BE REPORTED IMMEDIATELY: ?*FEVER GREATER THAN 100.4 F (38 ?C) OR HIGHER ?*CHILLS OR SWEATING ?*NAUSEA AND VOMITING THAT IS NOT CONTROLLED WITH YOUR NAUSEA MEDICATION ?*UNUSUAL SHORTNESS OF BREATH ?*UNUSUAL BRUISING OR BLEEDING ?*URINARY PROBLEMS (pain or burning when urinating, or frequent urination) ?*BOWEL PROBLEMS (unusual diarrhea, constipation, pain near the anus) ?TENDERNESS IN MOUTH AND THROAT WITH OR WITHOUT PRESENCE OF ULCERS (sore throat, sores in mouth, or a toothache) ?UNUSUAL RASH, SWELLING OR PAIN  ?UNUSUAL VAGINAL DISCHARGE OR ITCHING  ? ?Items with * indicate a potential emergency and should be followed up as soon as possible or go to the Emergency Department if any problems should occur. ? ?Please show the CHEMOTHERAPY ALERT CARD or IMMUNOTHERAPY ALERT CARD at check-in to the  Emergency Department and triage nurse. ? ?Should you have questions after your visit or need to cancel or reschedule your appointment, please contact [facility]  Dept: [phone]  and follow the prompts.  Office hours are 8:00 a.m. to 4:30 p.m. Monday - Friday. Please note that voicemails left after 4:00 p.m. may not be returned until the following business day.  We are closed weekends and major holidays. You have access to a nurse at all times for urgent questions. Please call the main number to the clinic Dept: [phone] and follow the prompts. ? ? ?For any non-urgent questions, you may also contact your provider using MyChart. We now offer e-Visits for anyone 18 and older to request care online for non-urgent symptoms. For details visit [email or web address]. ?  ?Also download the MyChart app! Go to the app store, search "MyChart", open the app, select [facility], and log in with your MyChart username and password. ? ?Due to Covid, a mask is required upon entering the hospital/clinic. If you do not have a mask, one will be given to you upon arrival. For doctor visits, patients may have 1 support person aged 18 or older with them. For treatment visits, patients cannot have anyone with them due to current Covid guidelines and our immunocompromised population.  ? ?

## 2021-07-11 ENCOUNTER — Telehealth: Payer: Medicare Other | Admitting: Family

## 2021-07-11 DIAGNOSIS — C3491 Malignant neoplasm of unspecified part of right bronchus or lung: Secondary | ICD-10-CM

## 2021-07-11 DIAGNOSIS — J208 Acute bronchitis due to other specified organisms: Secondary | ICD-10-CM

## 2021-07-11 DIAGNOSIS — B9689 Other specified bacterial agents as the cause of diseases classified elsewhere: Secondary | ICD-10-CM

## 2021-07-11 MED ORDER — DOXYCYCLINE HYCLATE 100 MG PO TABS: 100.0000 mg | ORAL_TABLET | Freq: Two times a day (BID) | ORAL | 0 refills | Status: DC

## 2021-07-11 MED ORDER — BENZONATATE 100 MG PO CAPS: 100.0000 mg | ORAL_CAPSULE | Freq: Three times a day (TID) | ORAL | 0 refills | Status: DC | PRN

## 2021-07-11 NOTE — Patient Instructions (Signed)
Acute Bronchitis, Adult °Acute bronchitis is sudden inflammation of the main airways (bronchi) that come off the windpipe (trachea) in the lungs. The swelling causes the airways to get smaller and make more mucus than normal. This can make it hard to breathe and can cause coughing or noisy breathing (wheezing). °Acute bronchitis may last several weeks. The cough may last longer. Allergies, asthma, and exposure to smoke may make the condition worse. °What are the causes? °This condition can be caused by germs and by substances that irritate the lungs, including: °Cold and flu viruses. The most common cause of this condition is the virus that causes the common cold. °Bacteria. This is less common. °Breathing in substances that irritate the lungs, including: °Smoke from cigarettes and other forms of tobacco. °Dust and pollen. °Fumes from household cleaning products, gases, or burned fuel. °Indoor or outdoor air pollution. °What increases the risk? °The following factors may make you more likely to develop this condition: °A weak body's defense system, also called the immune system. °A condition that affects your lungs and breathing, such as asthma. °What are the signs or symptoms? °Common symptoms of this condition include: °Coughing. This may bring up clear, yellow, or green mucus from your lungs (sputum). °Wheezing. °Runny or stuffy nose. °Having too much mucus in your lungs (chest congestion). °Shortness of breath. °Aches and pains, including sore throat or chest. °How is this diagnosed? °This condition is usually diagnosed based on: °Your symptoms and medical history. °A physical exam. °You may also have other tests, including tests to rule out other conditions, such as pneumonia. These tests include: °A test of lung function. °Test of a mucus sample to look for the presence of bacteria. °Tests to check the oxygen level in your blood. °Blood tests. °Chest X-ray. °How is this treated? °Most cases of acute bronchitis  clear up over time without treatment. Your health care provider may recommend: °Drinking more fluids to help thin your mucus so it is easier to cough up. °Taking inhaled medicine (inhaler) to improve air flow in and out of your lungs. °Using a vaporizer or a humidifier. These are machines that add water to the air to help you breathe better. °Taking a medicine that thins mucus and clears congestion (expectorant). °Taking a medicine that prevents or stops coughing (cough suppressant). °It is notcommon to take an antibiotic medicine for this condition. °Follow these instructions at home: ° °Take over-the-counter and prescription medicines only as told by your health care provider. °Use an inhaler, vaporizer, or humidifier as told by your health care provider. °Take two teaspoons (10 mL) of honey at bedtime to lessen coughing at night. °Drink enough fluid to keep your urine pale yellow. °Do not use any products that contain nicotine or tobacco. These products include cigarettes, chewing tobacco, and vaping devices, such as e-cigarettes. If you need help quitting, ask your health care provider. °Get plenty of rest. °Return to your normal activities as told by your health care provider. Ask your health care provider what activities are safe for you. °Keep all follow-up visits. This is important. °How is this prevented? °To lower your risk of getting this condition again: °Wash your hands often with soap and water for at least 20 seconds. If soap and water are not available, use hand sanitizer. °Avoid contact with people who have cold symptoms. °Try not to touch your mouth, nose, or eyes with your hands. °Avoid breathing in smoke or chemical fumes. Breathing smoke or chemical fumes will make your condition   worse. °Get the flu shot every year. °Contact a health care provider if: °Your symptoms do not improve after 2 weeks. °You have trouble coughing up the mucus. °Your cough keeps you awake at night. °You have a  fever. °Get help right away if you: °Cough up blood. °Feel pain in your chest. °Have severe shortness of breath. °Faint or keep feeling like you are going to faint. °Have a severe headache. °Have a fever or chills that get worse. °These symptoms may represent a serious problem that is an emergency. Do not wait to see if the symptoms will go away. Get medical help right away. Call your local emergency services (911 in the [place] Do not drive yourself to the hospital. °Summary °Acute bronchitis is inflammation of the main airways (bronchi) that come off the windpipe (trachea) in the lungs. The swelling causes the airways to get smaller and make more mucus than normal. °Drinking more fluids can help thin your mucus so it is easier to cough up. °Take over-the-counter and prescription medicines only as told by your health care provider. °Do not use any products that contain nicotine or tobacco. These products include cigarettes, chewing tobacco, and vaping devices, such as e-cigarettes. If you need help quitting, ask your health care provider. °Contact a health care provider if your symptoms do not improve after 2 weeks. °This information is not intended to replace advice given to you by your health care provider. Make sure you discuss any questions you have with your health care provider. °Document Revised: 09/13/2020 Document Reviewed: 09/13/2020 °Elsevier Patient Education © 2022 [employer] ° °

## 2021-07-11 NOTE — Progress Notes (Signed)
Virtual Visit Consent   Kirk [name], you are scheduled for a virtual visit with a [facility] provider today.     Just as with appointments in the office, your consent must be obtained to participate.  Your consent will be active for this visit and any virtual visit you may have with one of our providers in the next 365 days.     If you have a MyChart account, a copy of this consent can be sent to you electronically.  All virtual visits are billed to your insurance company just like a traditional visit in the office.    As this is a virtual visit, video technology does not allow for your provider to perform a traditional examination.  This may limit your provider's ability to fully assess your condition.  If your provider identifies any concerns that need to be evaluated in person or the need to arrange testing (such as labs, EKG, etc.), we will make arrangements to do so.     Although advances in technology are sophisticated, we cannot ensure that it will always work on either your end or our end.  If the connection with a video visit is poor, the visit may have to be switched to a telephone visit.  With either a video or telephone visit, we are not always able to ensure that we have a secure connection.     I need to obtain your verbal consent now.   Are you willing to proceed with your visit today?    Kirk [name] has provided verbal consent on 07/11/2021 for a virtual visit (video or telephone).   Kirk Dun, FNP   Date: 07/11/2021 10:36 AM   Virtual Visit via Video Note   I, Kirk [name], connected with  Kirk [name]  ([id number], 05-Jan-1956) on 07/11/21 at 10:45 AM EST by a video-enabled telemedicine application and verified that I am speaking with the correct person using two identifiers.  Location: Patient: Virtual Visit Location Patient: Home Provider: Virtual Visit Location Provider: Home Office   I discussed the limitations of evaluation and management by  telemedicine and the availability of in person appointments. The patient expressed understanding and agreed to proceed.    History of Present Illness: Kirk [name] is a 66 y.o. who identifies as a male who was assigned male at birth, and is being seen today for cough for a week. Using OTC without relief. Has stage 4 Lung cancer.   HPI: Cough This is a new problem. The current episode started 1 to 4 weeks ago. The problem has been gradually worsening. The problem occurs every few minutes. The cough is Productive of sputum. Associated symptoms include a fever, nasal congestion, postnasal drip, rhinorrhea, shortness of breath and wheezing. Pertinent negatives include no chills, ear congestion, ear pain, headaches or myalgias. He has tried rest and OTC cough suppressant for the symptoms.   Problems:  Patient Active Problem List   Diagnosis Date Noted   Chemotherapy-induced neuropathy ([place]) 05/31/2021   Drug-induced neutropenia ([place]) 12/26/2020   Port-A-Cath in place 12/29/2019   Metastatic non-small cell lung cancer ([place]) 07/07/2019   Encounter for antineoplastic chemotherapy 07/07/2019   Encounter for antineoplastic immunotherapy 07/07/2019   Goals of care, counseling/discussion 06/28/2019   Adenocarcinoma of right lung, stage 4 ([place]) 06/28/2019   Cancer associated pain 06/28/2019   CAD (coronary artery disease) 06/08/2019   Mediastinal lymphadenopathy 06/03/2019   Liver metastasis ([place]) 06/03/2019   Hemoglobin A1C between 7% and 9% indicating  borderline diabetic control ([place]) 05/10/2019   COVID-19 virus infection 05/10/2019   Atrial flutter with rapid ventricular response ([place]) 04/19/2019   Chest pain with moderate risk for cardiac etiology 04/19/2019   Paroxysmal atrial flutter ([place]) 04/19/2019   Chest congestion 07/10/2018   Cough 07/10/2018   NSTEMI (non-ST elevated myocardial infarction) ([place]) 09/04/2015   PVD (peripheral vascular disease) ([place]) 06/08/2015    DDD (degenerative disc disease), cervical 06/08/2015   Hyperlipidemia 06/08/2015   Diabetes mellitus with nephropathy ([place]) 06/08/2015   Diabetes mellitus with peripheral vascular disease ([place]) 06/08/2015   Annual physical exam 01/03/2015   PAD (peripheral artery disease) ([facility]) 02/18/2013   Diabetes ([place]) 02/18/2013   HTN (hypertension) 02/18/2013    Allergies: No Known Allergies Medications:  Current Outpatient Medications:    benzonatate (TESSALON PERLES) 100 MG capsule, Take 1 capsule (100 mg total) by mouth 3 (three) times daily as needed., Disp: 20 capsule, Rfl: 0   doxycycline (VIBRA-TABS) 100 MG tablet, Take 1 tablet (100 mg total) by mouth 2 (two) times daily., Disp: 20 tablet, Rfl: 0   acetaminophen (TYLENOL) 500 MG tablet, Take 500 mg by mouth every 6 (six) hours as needed for moderate pain or fever. , Disp: , Rfl:    aspirin EC 81 MG tablet, Take 81 mg by mouth at bedtime., Disp: , Rfl:    blood glucose meter kit and supplies, Dispense based on patient and insurance preference. Use up to four times daily as directed. (FOR ICD-10 E10.9, E11.9)., Disp: 1 each, Rfl: 0   carvedilol (COREG) 12.5 MG tablet, Take 1 tablet (12.5 mg total) by mouth 2 (two) times daily with a meal., Disp: 180 tablet, Rfl: 3   clopidogrel (PLAVIX) 75 MG tablet, Take 1 tablet (75 mg total) by mouth daily., Disp: 90 tablet, Rfl: 3   docusate sodium (COLACE) 100 MG capsule, Take 1 capsule (100 mg total) by mouth 2 (two) times daily as needed for mild constipation., Disp: 180 capsule, Rfl: 3   ferrous sulfate 324 MG TBEC, Take 1 tablet (324 mg total) by mouth daily with breakfast., Disp: 30 tablet, Rfl: 11   gabapentin (NEURONTIN) 300 MG capsule, Take 1 capsule (300 mg total) by mouth 3 (three) times daily., Disp: 270 capsule, Rfl: 3   glimepiride (AMARYL) 4 MG tablet, Take 1 tablet (4 mg total) by mouth daily., Disp: 90 tablet, Rfl: 3   glucose blood (ACCU-CHEK GUIDE) test strip, Use as  instructed up to four times daily as directed., Disp: 100 each, Rfl: 12   lidocaine-prilocaine (EMLA) cream, APPLY TO THE PORT-A-CATH SITE 30-60 MINUTES BEFORE TREATMENT. (Patient taking differently: Apply 1 application topically daily as needed (access port). Apply to the Port-A-Cath site 30-60 minutes before treatment.), Disp: 30 g, Rfl: 0   metFORMIN (GLUCOPHAGE) 1000 MG tablet, Take 1,000 mg by mouth 2 (two) times daily. (Patient not taking: Reported on 05/31/2021), Disp: , Rfl:    multivitamin (ONE-A-DAY MEN'S) TABS tablet, Take 1 tablet by mouth daily., Disp: 30 tablet, Rfl: 6   nitroGLYCERIN (NITROSTAT) 0.4 MG SL tablet, Place 1 tablet (0.4 mg total) under the tongue every 5 (five) minutes x 3 doses as needed for chest pain. (Patient not taking: Reported on 06/07/2021), Disp: 25 tablet, Rfl: 2   rosuvastatin (CRESTOR) 40 MG tablet, Take 1 tablet (40 mg total) by mouth daily., Disp: 90 tablet, Rfl: 3  Observations/Objective: Patient is well-developed, well-nourished in no acute distress.  Resting comfortably  at home.  Head is normocephalic, atraumatic.  No  labored breathing.  Speech is clear and coherent with logical content.  Patient is alert and oriented at baseline.  Nasal congestion   Assessment and Plan: 1. Acute bacterial bronchitis - doxycycline (VIBRA-TABS) 100 MG tablet; Take 1 tablet (100 mg total) by mouth 2 (two) times daily.  Dispense: 20 tablet; Refill: 0 - benzonatate (TESSALON PERLES) 100 MG capsule; Take 1 capsule (100 mg total) by mouth 3 (three) times daily as needed.  Dispense: 20 capsule; Refill: 0  2. Adenocarcinoma of right lung, stage 4 (H[facility])  - Take meds as prescribed - Use a cool mist humidifier  -Use saline nose sprays frequently -Force fluids -For any cough or congestion  Use plain Mucinex- regular strength or max strength is fine -For fever or aces or pains- take tylenol or ibuprofen. -Throat lozenges if help Follow up if symptoms worsen  or do not  improve    Follow Up Instructions: I discussed the assessment and treatment plan with the patient. The patient was provided an opportunity to ask questions and all were answered. The patient agreed with the plan and demonstrated an understanding of the instructions.  A copy of instructions were sent to the patient via MyChart unless otherwise noted below.     The patient was advised to call back or seek an in-person evaluation if the symptoms worsen or if the condition fails to improve as anticipated.  Time:  I spent 6 minutes with the patient via telehealth technology discussing the above problems/concerns.    Kirk Dun, FNP

## 2021-07-16 ENCOUNTER — Other Ambulatory Visit: Payer: Self-pay

## 2021-07-16 ENCOUNTER — Ambulatory Visit (HOSPITAL_COMMUNITY)
Admission: RE | Admit: 2021-07-16 | Discharge: 2021-07-16 | Disposition: A | Discharge status: Home / Self Care | Payer: Medicare Other | Source: Ambulatory Visit | Attending: Internal Medicine | Admitting: Internal Medicine

## 2021-07-16 ENCOUNTER — Encounter (HOSPITAL_COMMUNITY): Payer: Self-pay

## 2021-07-16 DIAGNOSIS — C3490 Malignant neoplasm of unspecified part of unspecified bronchus or lung: Secondary | ICD-10-CM | POA: Insufficient documentation

## 2021-07-17 ENCOUNTER — Ambulatory Visit (HOSPITAL_COMMUNITY): Payer: Medicare Other

## 2021-07-17 NOTE — Progress Notes (Deleted)
Kirk [name] OFFICE PROGRESS NOTE  Kirk Francois, NP [facility] [address] [place] [address]  DIAGNOSIS: Stage IV (TX, N3, M1c) non-small cell lung cancer, poorly differentiated adenocarcinoma presented with bulky mediastinal lymphadenopathy in addition to right axillary lymphadenopathy and metastatic liver lesions, abdominal lymphadenopathy diagnosed in January 2021. Molecular studies by [facility] that shows no actionable mutations.  PRIOR THERAPY: None  CURRENT THERAPY: Systemic chemotherapy with carboplatin for AUC of 5, Alimta 500 mg/M2 and Keytruda 200 mg IV every 3 weeks.  First dose July 14, 2019.  Status post 35 cycles. Starting from cycle #5 the patient will be treated with maintenance Alimta and Keytruda every 3 weeks. Starting from cycle #12 he will be on single agent treatment with Keytruda secondary to renal insufficiency. ***   INTERVAL HISTORY: Kirk [name] 66 y.o. male returns to the clinic today for follow-up visit.  The patient is feeling fairly well today without any concerning complaints.  The patient completed 2 years of immunotherapy with Keytruda.  His chemotherapy is Alimta was discontinued due to renal insufficiency.  He follows with Dr. Carmina Miller from nephrology.  The patient tolerated his last cycle of treatment well.  He denies any recent fever, chills, night sweats, or unexplained weight loss.  He denies any chest pain or hemoptysis.  Cough and shortness of breath?  He denies any nausea, vomiting, diarrhea, or constipation.  Denies any headache or visual changes.  Denies any rashes or skin changes.  The patient recently had a restaging CT scan performed.  He is here today for evaluation to review his scan results and for more detailed discussion about his current condition and next steps in his treatment. MEDICAL HISTORY: Past Medical History:  Diagnosis Date   Arthritis    "right knee" (09/04/2015)   Atrial flutter with rapid ventricular  response ([place]) 04/19/2019   Dizziness 10/2019   Dyspnea    increased exertion   Elevated serum creatinine 07/2020   HTN (hypertension)    Hyperlipidemia    Lung cancer ([place]) 05/2019   Microalbuminuria 07/2020   NSTEMI (non-ST elevated myocardial infarction) ([facility]) 09/04/2015   a. cath 09/05/2015: 95% stenosis mid-LAD, 55% mid-RCA, and 40% prox-RCA. PCI performed w/ Synergy DES to LAD   PAD (peripheral artery disease) ([facility])    Stenting of bilateral iliacs in 2003.   Refusal of blood transfusions as patient is Jehovah's Witness    Type II diabetes mellitus ([place])    Vitamin D deficiency 07/2020    ALLERGIES:  has No Known Allergies.  MEDICATIONS:  Current Outpatient Medications  Medication Sig Dispense Refill   acetaminophen (TYLENOL) 500 MG tablet Take 500 mg by mouth every 6 (six) hours as needed for moderate pain or fever.      aspirin EC 81 MG tablet Take 81 mg by mouth at bedtime.     benzonatate (TESSALON PERLES) 100 MG capsule Take 1 capsule (100 mg total) by mouth 3 (three) times daily as needed. 20 capsule 0   blood glucose meter kit and supplies Dispense based on patient and insurance preference. Use up to four times daily as directed. (FOR ICD-10 E10.9, E11.9). 1 each 0   carvedilol (COREG) 12.5 MG tablet Take 1 tablet (12.5 mg total) by mouth 2 (two) times daily with a meal. 180 tablet 3   clopidogrel (PLAVIX) 75 MG tablet Take 1 tablet (75 mg total) by mouth daily. 90 tablet 3   docusate sodium (COLACE) 100 MG capsule Take 1  capsule (100 mg total) by mouth 2 (two) times daily as needed for mild constipation. 180 capsule 3   doxycycline (VIBRA-TABS) 100 MG tablet Take 1 tablet (100 mg total) by mouth 2 (two) times daily. 20 tablet 0   ferrous sulfate 324 MG TBEC Take 1 tablet (324 mg total) by mouth daily with breakfast. 30 tablet 11   gabapentin (NEURONTIN) 300 MG capsule Take 1 capsule (300 mg total) by mouth 3 (three) times daily. 270 capsule 3   glimepiride (AMARYL) 4 MG  tablet Take 1 tablet (4 mg total) by mouth daily. 90 tablet 3   glucose blood (ACCU-CHEK GUIDE) test strip Use as instructed up to four times daily as directed. 100 each 12   lidocaine-prilocaine (EMLA) cream APPLY TO THE PORT-A-CATH SITE 30-60 MINUTES BEFORE TREATMENT. (Patient taking differently: Apply 1 application topically daily as needed (access port). Apply to the Port-A-Cath site 30-60 minutes before treatment.) 30 g 0   metFORMIN (GLUCOPHAGE) 1000 MG tablet Take 1,000 mg by mouth 2 (two) times daily. (Patient not taking: Reported on 05/31/2021)     multivitamin (ONE-A-DAY MEN'S) TABS tablet Take 1 tablet by mouth daily. 30 tablet 6   nitroGLYCERIN (NITROSTAT) 0.4 MG SL tablet Place 1 tablet (0.4 mg total) under the tongue every 5 (five) minutes x 3 doses as needed for chest pain. (Patient not taking: Reported on 06/07/2021) 25 tablet 2   rosuvastatin (CRESTOR) 40 MG tablet Take 1 tablet (40 mg total) by mouth daily. 90 tablet 3   No current facility-administered medications for this visit.    SURGICAL HISTORY:  Past Surgical History:  Procedure Laterality Date   CARDIAC CATHETERIZATION N/A 09/05/2015   Procedure: Left Heart Cath and Coronary Angiography;  Surgeon: Troy Sine, MD;  Location: [facility];  Service: Cardiovascular;  Laterality: N/A;   CARDIAC CATHETERIZATION N/A 09/05/2015   Procedure: Coronary Stent Intervention;  Surgeon: Troy Sine, MD;  Location: [facility];  Service: Cardiovascular;  Laterality: N/A;   IR IMAGING GUIDED PORT INSERTION  07/15/2019   ORIF CONGENITAL HIP DISLOCATION Left ~ 1965   "put pins in it"   PERIPHERAL VASCULAR CATHETERIZATION Bilateral 2003   "stenting"   TONSILLECTOMY  ~ 1963    REVIEW OF SYSTEMS:   Review of Systems  Constitutional: Negative for appetite change, chills, fatigue, fever and unexpected weight change.  HENT:   Negative for mouth sores, nosebleeds, sore throat and trouble swallowing.   Eyes: Negative for  eye problems and icterus.  Respiratory: Negative for cough, hemoptysis, shortness of breath and wheezing.   Cardiovascular: Negative for chest pain and leg swelling.  Gastrointestinal: Negative for abdominal pain, constipation, diarrhea, nausea and vomiting.  Genitourinary: Negative for bladder incontinence, difficulty urinating, dysuria, frequency and hematuria.   Musculoskeletal: Negative for back pain, gait problem, neck pain and neck stiffness.  Skin: Negative for itching and rash.  Neurological: Negative for dizziness, extremity weakness, gait problem, headaches, light-headedness and seizures.  Hematological: Negative for adenopathy. Does not bruise/bleed easily.  Psychiatric/Behavioral: Negative for confusion, depression and sleep disturbance. The patient is not nervous/anxious.     PHYSICAL EXAMINATION:  There were no vitals taken for this visit.  ECOG PERFORMANCE STATUS: {CHL ONC ECOG [id number]  Physical Exam  Constitutional: Oriented to person, place, and time and well-developed, well-nourished, and in no distress. No distress.  HENT:  Head: Normocephalic and atraumatic.  Mouth/Throat: Oropharynx is clear and moist. No oropharyngeal exudate.  Eyes: Conjunctivae are normal. Right eye  exhibits no discharge. Left eye exhibits no discharge. No scleral icterus.  Neck: Normal range of motion. Neck supple.  Cardiovascular: Normal rate, regular rhythm, normal heart sounds and intact distal pulses.   Pulmonary/Chest: Effort normal and breath sounds normal. No respiratory distress. No wheezes. No rales.  Abdominal: Soft. Bowel sounds are normal. Exhibits no distension and no mass. There is no tenderness.  Musculoskeletal: Normal range of motion. Exhibits no edema.  Lymphadenopathy:    No cervical adenopathy.  Neurological: Alert and oriented to person, place, and time. Exhibits normal muscle tone. Gait normal. Coordination normal.  Skin: Skin is warm and dry. No rash noted. Not  diaphoretic. No erythema. No pallor.  Psychiatric: Mood, memory and judgment normal.  Vitals reviewed.  LABORATORY DATA: Lab Results  Component Value Date   WBC 6.1 06/28/2021   HGB 10.1 (L) 06/28/2021   HCT 33.3 (L) 06/28/2021   MCV 81.0 06/28/2021   PLT 287 06/28/2021      Chemistry      Component Value Date/Time   NA 137 06/28/2021 0931   NA 140 06/08/2015 0840   K 4.2 06/28/2021 0931   CL 104 06/28/2021 0931   CO2 27 06/28/2021 0931   BUN 21 06/28/2021 0931   BUN 15 06/08/2015 0840   CREATININE 1.58 (H) 06/28/2021 0931   CREATININE 1.06 10/01/2016 0855      Component Value Date/Time   CALCIUM 9.5 06/28/2021 0931   ALKPHOS 72 06/28/2021 0931   AST 14 (L) 06/28/2021 0931   ALT 13 06/28/2021 0931   BILITOT 0.3 06/28/2021 0931       RADIOGRAPHIC STUDIES:  No results found.   ASSESSMENT/PLAN:  This is a very pleasant 66 year old African-American male diagnosed with stage IV (TX, N3, M1C) non-small cell lung cancer, poorly differentiated adenocarcinoma. He presented with bulky mediastinal as well as right hilar, supraclavicular, abdominal, and right axillary lymphadenopathy in addition to metastatic disease to the liver. He was diagnosed in January 2021. The patient does not have any actionable mutations. The patient was not a candidate for treatment with targeted therapy or enrollment in a clinical trial with the [facility] regimen.  The patient is currently undergoing systemic chemotherapy with carboplatin for an AUC of 5, Alimta 500 mg per metered squared, Keytruda 200 mg IV every 3 weeks. He is status post 35 cycles. Starting from cycle #5 patient has been on maintenance Alimta and Keytruda IV every 3 weeks. Starting from cycle #12 he has been on single agent treatment with Keytruda secondary to renal insufficiency.  He completed cycle #35 treatment 3 weeks ago.  The patient recently had a restaging CT scan performed.  Dr. [name] personally and independently reviewed  the scan discussed results with patient today.  The scan showed ***Dr. [name] gave the patient the option of continuing observation versus continuing on with [facility] every 3 weeks.  The patient opted to ***.   Labs were reviewed.  His creatinine is elevated at his baseline 2***due to his CKD.  The patient follows closely with Dr. Carmina Miller from nephrology.  Recommend that he proceed with cycle 36 today scheduled.  We will see him back for follow-up visit in 3 weeks for evaluation before starting cycle #37.  Observation and arrange for scan?  Arrange for scan without contrast.  The patient was advised to call immediately if he has any concerning symptoms in the interval. The patient voices understanding of current disease status and treatment options and is in agreement with the current care  plan. All questions were answered. The patient knows to call the clinic with any problems, questions or concerns. We can certainly see the patient much sooner if necessary          No orders of the defined types were placed in this encounter.    I spent {CHL ONC TIME VISIT - NVBTY:[id number]} counseling the patient face to face. The total time spent in the appointment was {CHL ONC TIME VISIT - FSFSE:[id number]}.  Kirk [name] L Bettylee Feig, PA-C 07/17/21

## 2021-07-18 ENCOUNTER — Encounter: Payer: Self-pay | Admitting: Cardiovascular Disease

## 2021-07-18 ENCOUNTER — Ambulatory Visit (INDEPENDENT_AMBULATORY_CARE_PROVIDER_SITE_OTHER): Payer: Medicare Other | Admitting: Cardiovascular Disease

## 2021-07-18 ENCOUNTER — Other Ambulatory Visit: Payer: Self-pay

## 2021-07-18 VITALS — BP 134/62 | HR 82 | Ht 75.0 in | Wt 223.6 lb

## 2021-07-18 DIAGNOSIS — I10 Essential (primary) hypertension: Secondary | ICD-10-CM | POA: Diagnosis not present

## 2021-07-18 DIAGNOSIS — E7800 Pure hypercholesterolemia, unspecified: Secondary | ICD-10-CM

## 2021-07-18 DIAGNOSIS — I4892 Unspecified atrial flutter: Secondary | ICD-10-CM

## 2021-07-18 DIAGNOSIS — I2510 Atherosclerotic heart disease of native coronary artery without angina pectoris: Secondary | ICD-10-CM | POA: Diagnosis not present

## 2021-07-18 DIAGNOSIS — I739 Peripheral vascular disease, unspecified: Secondary | ICD-10-CM

## 2021-07-18 NOTE — Progress Notes (Signed)
Chief Complaint  Patient presents with   Follow-up    CAD   History of Present Illness: 66 yo male with history of CAD, PAD, HLD, HTN, paroxysmal atrial flutter and DM who is here today for follow up. He has a history of PAD s/p bilateral lower extremity stenting in 2003. ABI March 2019 stable. He was admitted to [facility] in April 2017 with a NSTEMI. Cardiac cath 09/05/15 with high grade mid LAD stenosis treated with a drug eluting stent x 1. There was mild to moderate non-obstructive disease in the Circumflex and RCA. Echo April 2017 with normal LV systolic function. He was seen in our office April 2019 with c/o dyspnea. Nuclear stress test April 2019 with no ischemia. He was admitted to [facility] in November 2020 with atrial flutter. Echo November 2020 with LVEF=60-65%, no valve disease. He was started on Eliquis. CT chest December 2020 with multiple lung lesions. He has since been diagnosed with non small cell lung cancer and is on chemotherapy. He stopped Eliquis due to cost and does not wish to restart. He does not wish to start coumadin.   He is here today for follow up. The patient denies any chest pain, dyspnea, palpitations, lower extremity edema, orthopnea, PND, dizziness, near syncope or syncope.   Primary Care Physician: [name], NP  Past Medical History:  Diagnosis Date   Arthritis    "right knee" (09/04/2015)   Atrial flutter with rapid ventricular response ([place]) 04/19/2019   Dizziness 10/2019   Dyspnea    increased exertion   Elevated serum creatinine 07/2020   HTN (hypertension)    Hyperlipidemia    Lung cancer ([place]) 05/2019   Microalbuminuria 07/2020   NSTEMI (non-ST elevated myocardial infarction) ([place]) 09/04/2015   a. cath 09/05/2015: 95% stenosis mid-LAD, 55% mid-RCA, and 40% prox-RCA. PCI performed w/ Synergy DES to LAD   PAD (peripheral artery disease) ([facility])    Stenting of bilateral iliacs in 2003.   Refusal of blood transfusions as patient is Jehovah's Witness     Type II diabetes mellitus ([place])    Vitamin D deficiency 07/2020    Past Surgical History:  Procedure Laterality Date   CARDIAC CATHETERIZATION N/A 09/05/2015   Procedure: Left Heart Cath and Coronary Angiography;  Surgeon: Troy Sine, MD;  Location: [place] [facility];  Service: Cardiovascular;  Laterality: N/A;   CARDIAC CATHETERIZATION N/A 09/05/2015   Procedure: Coronary Stent Intervention;  Surgeon: Troy Sine, MD;  Location: [place] [facility];  Service: Cardiovascular;  Laterality: N/A;   IR IMAGING GUIDED PORT INSERTION  07/15/2019   ORIF CONGENITAL HIP DISLOCATION Left ~ 1965   "put pins in it"   PERIPHERAL VASCULAR CATHETERIZATION Bilateral 2003   "stenting"   TONSILLECTOMY  ~ 1963    Current Outpatient Medications  Medication Sig Dispense Refill   acetaminophen (TYLENOL) 500 MG tablet Take 500 mg by mouth every 6 (six) hours as needed for moderate pain or fever.      aspirin EC 81 MG tablet Take 81 mg by mouth at bedtime.     benzonatate (TESSALON PERLES) 100 MG capsule Take 1 capsule (100 mg total) by mouth 3 (three) times daily as needed. 20 capsule 0   blood glucose meter kit and supplies Dispense based on patient and insurance preference. Use up to four times daily as directed. (FOR ICD-10 E10.9, E11.9). 1 each 0   carvedilol (COREG) 12.5 MG tablet Take 1 tablet (12.5 mg total) by  mouth 2 (two) times daily with a meal. 180 tablet 3   clopidogrel (PLAVIX) 75 MG tablet Take 1 tablet (75 mg total) by mouth daily. 90 tablet 3   docusate sodium (COLACE) 100 MG capsule Take 1 capsule (100 mg total) by mouth 2 (two) times daily as needed for mild constipation. 180 capsule 3   doxycycline (VIBRA-TABS) 100 MG tablet Take 1 tablet (100 mg total) by mouth 2 (two) times daily. 20 tablet 0   gabapentin (NEURONTIN) 300 MG capsule Take 1 capsule (300 mg total) by mouth 3 (three) times daily. 270 capsule 3   glimepiride (AMARYL) 4 MG tablet Take 1 tablet (4 mg total) by mouth  daily. 90 tablet 3   glucose blood (ACCU-CHEK GUIDE) test strip Use as instructed up to four times daily as directed. 100 each 12   lidocaine-prilocaine (EMLA) cream APPLY TO THE PORT-A-CATH SITE 30-60 MINUTES BEFORE TREATMENT. (Patient taking differently: Apply 1 application topically daily as needed (access port). Apply to the Port-A-Cath site 30-60 minutes before treatment.) 30 g 0   metFORMIN (GLUCOPHAGE) 1000 MG tablet Take 1,000 mg by mouth 2 (two) times daily.     multivitamin (ONE-A-DAY MEN'S) TABS tablet Take 1 tablet by mouth daily. 30 tablet 6   nitroGLYCERIN (NITROSTAT) 0.4 MG SL tablet Place 1 tablet (0.4 mg total) under the tongue every 5 (five) minutes x 3 doses as needed for chest pain. 25 tablet 2   rosuvastatin (CRESTOR) 40 MG tablet Take 1 tablet (40 mg total) by mouth daily. 90 tablet 3   ferrous sulfate 324 MG TBEC Take 1 tablet (324 mg total) by mouth daily with breakfast. (Patient not taking: Reported on 07/18/2021) 30 tablet 11   No current facility-administered medications for this visit.    No Known Allergies  Social History   Socioeconomic History   Marital status: Married    Spouse name: Not on file   Number of children: 1   Years of education: Not on file   Highest education level: Not on file  Occupational History   Occupation: Furniture salesman  Tobacco Use   Smoking status: Former    Packs/day: 2.00    Years: 35.00    Pack years: 70.00    Types: Cigarettes    Quit date: 02/19/2003    Years since quitting: 18.4   Smokeless tobacco: Never  Vaping Use   Vaping Use: Never used  Substance and Sexual Activity   Alcohol use: Not Currently    Alcohol/week: 0.0 standard drinks   Drug use: Not Currently    Types: Marijuana   Sexual activity: Not Currently  Other Topics Concern   Not on file  Social History Narrative   Not on file   Social Determinants of Health   Financial Resource Strain: Low Risk    Difficulty of Paying Living Expenses: Not hard  at all  Food Insecurity: No Food Insecurity   Worried About Charity fundraiser in the Last Year: Never true   [place] in the Last Year: Never true  Transportation Needs: No Transportation Needs   Lack of Transportation (Medical): No   Lack of Transportation (Non-Medical): No  Physical Activity: Sufficiently Active   Days of Exercise per Week: 5 days   Minutes of Exercise per Session: 150+ min  Stress: No Stress Concern Present   Feeling of Stress : Not at all  Social Connections: Moderately Integrated   Frequency of Communication with Friends and Family: More than three  times a week   Frequency of Social Gatherings with Friends and Family: More than three times a week   Attends Religious Services: More than 4 times per year   Active Member of [employer] or Organizations: No   Attends Music therapist: Never   Marital Status: Married  Human resources officer Violence: Not At Risk   Fear of Current or Ex-Partner: No   Emotionally Abused: No   Physically Abused: No   Sexually Abused: No    Family History  Problem Relation Age of Onset   Lymphoma Mother    Cancer - Prostate Father    CAD Neg Hx    Colon cancer Neg Hx    Rectal cancer Neg Hx    Stomach cancer Neg Hx    Esophageal cancer Neg Hx     Review of Systems:  As stated in the HPI and otherwise negative.   BP 134/62    Pulse 82    Ht _0  (1.905 m)    Wt 223 lb 9.6 oz (101.4 kg)    SpO2 98%    BMI 27.95 kg/m   Physical Examination:  General: Well developed, well nourished, NAD  HEENT: OP clear, mucus membranes moist  SKIN: warm, dry. No rashes. Neuro: No focal deficits  Musculoskeletal: Muscle strength 5/5 all ext  Psychiatric: Mood and affect normal  Neck: No JVD, no carotid bruits, no thyromegaly, no lymphadenopathy.  Lungs:Clear bilaterally, no wheezes, rhonci, crackles Cardiovascular: Regular rate and rhythm. No murmurs, gallops or rubs. Abdomen:Soft. Bowel sounds present. Non-tender.   Extremities: No lower extremity edema. Pulses are 2 + in the bilateral DP/PT.  EKG:  EKG is  ordered today. The ekg ordered today demonstrates sinus  Recent Labs: 07/31/2020: Magnesium 1.9 06/28/2021: ALT 13; BUN 21; Creatinine 1.58; Hemoglobin 10.1; Platelet Count 287; Potassium 4.2; Sodium 137; TSH 1.113   Lipid Panel    Component Value Date/Time   CHOL 117 06/28/2020 0819   TRIG 132 06/28/2020 0819   HDL 35 (L) 06/28/2020 0819   CHOLHDL 3.3 06/28/2020 0819   CHOLHDL 3.7 03/12/2016 0837   VLDL 17 03/12/2016 0837   LDLCALC 59 06/28/2020 0819     Wt Readings from Last 3 Encounters:  07/18/21 223 lb 9.6 oz (101.4 kg)  06/28/21 230 lb (104.3 kg)  06/07/21 230 lb 1.6 oz (104.4 kg)     Other studies Reviewed: Additional studies/ records that were reviewed today include: . Review of the above records demonstrates:   Assessment and Plan:   1. CAD without angina: NSTEMI April 2017 with severe stenosis mid LAD treated with drug eluting stent. Mild to moderate non-obstructive disease in the RCA and Circumflex. No chest pain. Will continue ASA, Plavix, statin and beta blocker.   2. HTN: BP is well controlled. No changes today  3. HLD: LDL at goal in February 2022. He wishes to have repeat lipids and LFTs in priamry care. I have offered to check this here. Continue statin.   4. PAD: No claudication  5. Atrial flutter, paroxysmal: Sinus today. He does not wish to restart Eliquis due to cost and will not consider coumadin. If he has recurrence, will need to consider anti-coagulation. Will continue beta blocker.    Current medicines are reviewed at length with the patient today.  The patient does not have concerns regarding medicines.  The following changes have been made:  no change  Labs/ tests ordered today include:   Orders Placed This Encounter  Procedures  EKG 12-Lead     Disposition:   FU with me in 12 months   Signed, Lauree Chandler, MD 07/18/2021 3:44 PM     [facility] [address], [place], [place]  [address] Phone: [phone]; Fax: [phone]

## 2021-07-18 NOTE — Patient Instructions (Signed)
Medication Instructions:  Your physician recommends that you continue on your current medications as directed. Please refer to the Current Medication list given to you today.  *If you need a refill on your cardiac medications before your next appointment, please call your pharmacy*   Lab Work: NONE If you have labs (blood work) drawn today and your tests are completely normal, you will receive your results only by: [place] (if you have MyChart) OR A paper copy in the mail If you have any lab test that is abnormal or we need to change your treatment, we will call you to review the results.   Testing/Procedures: NONE   Follow-Up: At [facility], you and your health needs are our priority.  As part of our continuing mission to provide you with exceptional heart care, we have created designated Provider Care Teams.  These Care Teams include your primary Cardiologist (physician) and Advanced Practice Providers (APPs -  Physician Assistants and Nurse Practitioners) who all work together to provide you with the care you need, when you need it.  Your next appointment:   1 year(s)  The format for your next appointment:   In Person  Provider:   Lauree Chandler, MD

## 2021-07-19 ENCOUNTER — Inpatient Hospital Stay (HOSPITAL_BASED_OUTPATIENT_CLINIC_OR_DEPARTMENT_OTHER): Payer: Medicare Other | Admitting: Internal Medicine

## 2021-07-19 ENCOUNTER — Inpatient Hospital Stay: Payer: Medicare Other

## 2021-07-19 VITALS — BP 154/57 | HR 96 | Temp 96.5°F | Resp 20 | Ht 75.0 in | Wt 224.4 lb

## 2021-07-19 DIAGNOSIS — Z5112 Encounter for antineoplastic immunotherapy: Secondary | ICD-10-CM | POA: Diagnosis not present

## 2021-07-19 DIAGNOSIS — C3490 Malignant neoplasm of unspecified part of unspecified bronchus or lung: Secondary | ICD-10-CM

## 2021-07-19 DIAGNOSIS — Z95828 Presence of other vascular implants and grafts: Secondary | ICD-10-CM

## 2021-07-19 DIAGNOSIS — C3491 Malignant neoplasm of unspecified part of right bronchus or lung: Secondary | ICD-10-CM

## 2021-07-19 DIAGNOSIS — R5383 Other fatigue: Secondary | ICD-10-CM

## 2021-07-19 LAB — CBC WITH DIFFERENTIAL (CANCER CENTER ONLY)
Abs Immature Granulocytes: 0.02 10*3/uL (ref 0.00–0.07)
Basophils Absolute: 0 10*3/uL (ref 0.0–0.1)
Basophils Relative: 0 %
Eosinophils Absolute: 0.2 10*3/uL (ref 0.0–0.5)
Eosinophils Relative: 3 %
HCT: 31.7 % — ABNORMAL LOW (ref 39.0–52.0)
Hemoglobin: 10.1 g/dL — ABNORMAL LOW (ref 13.0–17.0)
Immature Granulocytes: 0 %
Lymphocytes Relative: 37 %
Lymphs Abs: 2.6 10*3/uL (ref 0.7–4.0)
MCH: 25.7 pg — ABNORMAL LOW (ref 26.0–34.0)
MCHC: 31.9 g/dL (ref 30.0–36.0)
MCV: 80.7 fL (ref 80.0–100.0)
Monocytes Absolute: 0.5 10*3/uL (ref 0.1–1.0)
Monocytes Relative: 7 %
Neutro Abs: 3.7 10*3/uL (ref 1.7–7.7)
Neutrophils Relative %: 53 %
Platelet Count: 315 10*3/uL (ref 150–400)
RBC: 3.93 MIL/uL — ABNORMAL LOW (ref 4.22–5.81)
RDW: 17.9 % — ABNORMAL HIGH (ref 11.5–15.5)
WBC Count: 7 10*3/uL (ref 4.0–10.5)
nRBC: 0 % (ref 0.0–0.2)

## 2021-07-19 LAB — CMP (CANCER CENTER ONLY)
ALT: 19 U/L (ref 0–44)
AST: 20 U/L (ref 15–41)
Albumin: 4.1 g/dL (ref 3.5–5.0)
Alkaline Phosphatase: 65 U/L (ref 38–126)
Anion gap: 6 (ref 5–15)
BUN: 24 mg/dL — ABNORMAL HIGH (ref 8–23)
CO2: 27 mmol/L (ref 22–32)
Calcium: 9.7 mg/dL (ref 8.9–10.3)
Chloride: 107 mmol/L (ref 98–111)
Creatinine: 1.55 mg/dL — ABNORMAL HIGH (ref 0.61–1.24)
GFR, Estimated: 49 mL/min — ABNORMAL LOW (ref >60)
Glucose, Bld: 172 mg/dL — ABNORMAL HIGH (ref 70–99)
Potassium: 4.2 mmol/L (ref 3.5–5.1)
Sodium: 140 mmol/L (ref 135–145)
Total Bilirubin: 0.3 mg/dL (ref 0.3–1.2)
Total Protein: 7.8 g/dL (ref 6.5–8.1)

## 2021-07-19 LAB — TSH: TSH: 0.695 u[IU]/mL (ref 0.320–4.118)

## 2021-07-19 MED ORDER — HEPARIN SOD (PORK) LOCK FLUSH 100 UNIT/ML IV SOLN
500.0000 [IU] | Freq: Once | INTRAVENOUS | Status: AC
  Administered 2021-07-19: 500 [IU] via INTRAVENOUS

## 2021-07-19 MED ORDER — SODIUM CHLORIDE 0.9% FLUSH
10.0000 mL | Freq: Once | INTRAVENOUS | Status: AC
  Administered 2021-07-19: 10 mL

## 2021-07-19 NOTE — Progress Notes (Signed)
Kirk [name]   [phone]  OFFICE PROGRESS NOTE  [name], NP [address] [name] [address]  DIAGNOSIS: Stage IV (TX, N3, M1c) non-small cell lung cancer, poorly differentiated adenocarcinoma presented with bulky mediastinal lymphadenopathy in addition to right axillary lymphadenopathy and metastatic liver lesions, abdominal lymphadenopathy diagnosed in January 2021. Molecular studies by [employer] that shows no actionable mutations.  PRIOR THERAPY: None  CURRENT THERAPY: Systemic chemotherapy with carboplatin for AUC of 5, Alimta 500 mg/M2 and Keytruda 200 mg IV every 3 weeks.  First dose July 14, 2019.  Status post 35  cycles. Starting from cycle #5 the patient will be treated with maintenance Alimta and Keytruda every 3 weeks.  Starting from cycle #12 he will be on single agent treatment with Keytruda secondary to renal insufficiency.  INTERVAL HISTORY: Kirk [name] 66 y.o. male returns to the clinic today for follow-up visit.  The patient is feeling fine today with no concerning complaints.  He denied having any chest pain, shortness of breath, cough or hemoptysis.  He denied having any nausea, vomiting, diarrhea or constipation.  He has no headache or visual changes.  He denied having any recent weight loss or night sweats.  He has been tolerating his treatment with immunotherapy fairly well.  He completed 2 years of treatment with Keytruda.  The patient is here today for evaluation and repeat CT scan of the chest, abdomen and pelvis for restaging of his disease.  MEDICAL HISTORY: Past Medical History:  Diagnosis Date   Arthritis    "right knee" (09/04/2015)   Atrial flutter with rapid ventricular response ([facility]) 04/19/2019   Dizziness 10/2019   Dyspnea    increased exertion   Elevated serum creatinine 07/2020   HTN (hypertension)    Hyperlipidemia    Lung cancer ([facility]) 05/2019   Microalbuminuria 07/2020    NSTEMI (non-ST elevated myocardial infarction) ([facility]) 09/04/2015   a. cath 09/05/2015: 95% stenosis mid-LAD, 55% mid-RCA, and 40% prox-RCA. PCI performed w/ Synergy DES to LAD   PAD (peripheral artery disease) ([facility])    Stenting of bilateral iliacs in 2003.   Refusal of blood transfusions as patient is Jehovah's Witness    Type II diabetes mellitus ([facility])    Vitamin D deficiency 07/2020    ALLERGIES:  has No Known Allergies.  MEDICATIONS:  Current Outpatient Medications  Medication Sig Dispense Refill   acetaminophen (TYLENOL) 500 MG tablet Take 500 mg by mouth every 6 (six) hours as needed for moderate pain or fever.      aspirin EC 81 MG tablet Take 81 mg by mouth at bedtime.     benzonatate (TESSALON PERLES) 100 MG capsule Take 1 capsule (100 mg total) by mouth 3 (three) times daily as needed. 20 capsule 0   blood glucose meter kit and supplies Dispense based on patient and insurance preference. Use up to four times daily as directed. (FOR ICD-10 E10.9, E11.9). 1 each 0   carvedilol (COREG) 12.5 MG tablet Take 1 tablet (12.5 mg total) by mouth 2 (two) times daily with a meal. 180 tablet 3   clopidogrel (PLAVIX) 75 MG tablet Take 1 tablet (75 mg total) by mouth daily. 90 tablet 3   docusate sodium (COLACE) 100 MG capsule Take 1 capsule (100 mg total) by mouth 2 (two) times daily as needed for mild constipation. 180 capsule 3   doxycycline (VIBRA-TABS) 100 MG tablet Take 1 tablet (100 mg  total) by mouth 2 (two) times daily. 20 tablet 0   ferrous sulfate 324 MG TBEC Take 1 tablet (324 mg total) by mouth daily with breakfast. (Patient not taking: Reported on 07/18/2021) 30 tablet 11   gabapentin (NEURONTIN) 300 MG capsule Take 1 capsule (300 mg total) by mouth 3 (three) times daily. 270 capsule 3   glimepiride (AMARYL) 4 MG tablet Take 1 tablet (4 mg total) by mouth daily. 90 tablet 3   glucose blood (ACCU-CHEK GUIDE) test strip Use as instructed up to four times daily as directed. 100 each 12    lidocaine-prilocaine (EMLA) cream APPLY TO THE PORT-A-CATH SITE 30-60 MINUTES BEFORE TREATMENT. (Patient taking differently: Apply 1 application topically daily as needed (access port). Apply to the Port-A-Cath site 30-60 minutes before treatment.) 30 g 0   metFORMIN (GLUCOPHAGE) 1000 MG tablet Take 1,000 mg by mouth 2 (two) times daily.     multivitamin (ONE-A-DAY MEN'S) TABS tablet Take 1 tablet by mouth daily. 30 tablet 6   nitroGLYCERIN (NITROSTAT) 0.4 MG SL tablet Place 1 tablet (0.4 mg total) under the tongue every 5 (five) minutes x 3 doses as needed for chest pain. 25 tablet 2   rosuvastatin (CRESTOR) 40 MG tablet Take 1 tablet (40 mg total) by mouth daily. 90 tablet 3   No current facility-administered medications for this visit.    SURGICAL HISTORY:  Past Surgical History:  Procedure Laterality Date   CARDIAC CATHETERIZATION N/A 09/05/2015   Procedure: Left Heart Cath and Coronary Angiography;  Surgeon: Troy Sine, MD;  Location: [facility];  Service: Cardiovascular;  Laterality: N/A;   CARDIAC CATHETERIZATION N/A 09/05/2015   Procedure: Coronary Stent Intervention;  Surgeon: Troy Sine, MD;  Location: [facility];  Service: Cardiovascular;  Laterality: N/A;   IR IMAGING GUIDED PORT INSERTION  07/15/2019   ORIF CONGENITAL HIP DISLOCATION Left ~ 1965   "put pins in it"   PERIPHERAL VASCULAR CATHETERIZATION Bilateral 2003   "stenting"   TONSILLECTOMY  ~ [place]:  Constitutional: positive for fatigue Eyes: negative Ears, nose, mouth, throat, and face: negative Respiratory: negative Cardiovascular: negative Gastrointestinal: negative Genitourinary:negative Integument/breast: negative Hematologic/lymphatic: negative Musculoskeletal:negative Neurological: negative Behavioral/Psych: negative Endocrine: negative Allergic/Immunologic: negative   PHYSICAL EXAMINATION: General appearance: alert, cooperative, appears stated age, fatigued, and  no distress Head: Normocephalic, without obvious abnormality, atraumatic Neck: no adenopathy, no JVD, supple, symmetrical, trachea midline, and thyroid not enlarged, symmetric, no tenderness/mass/nodules Lymph nodes: Cervical, supraclavicular, and axillary nodes normal. Resp: clear to auscultation bilaterally Back: symmetric, no curvature. ROM normal. No CVA tenderness. Cardio: regular rate and rhythm, S1, S2 normal, no murmur, click, rub or gallop GI: soft, non-tender; bowel sounds normal; no masses,  no organomegaly Extremities: extremities normal, atraumatic, no cyanosis or edema Neurologic: Alert and oriented X 3, normal strength and tone. Normal symmetric reflexes. Normal coordination and gait  ECOG PERFORMANCE STATUS: 1 - Symptomatic but completely ambulatory  Blood pressure (!) 154/57, pulse 96, temperature (!) 96.5 F (35.8 C), temperature source Tympanic, resp. rate 20, height 6' 3"  (1.905 m), weight 224 lb 6.4 oz (101.8 kg), SpO2 98 %.  LABORATORY DATA: Lab Results  Component Value Date   WBC 7.0 07/19/2021   HGB 10.1 (L) 07/19/2021   HCT 31.7 (L) 07/19/2021   MCV 80.7 07/19/2021   PLT 315 07/19/2021      Chemistry      Component Value Date/Time   NA 137 06/28/2021 0931  NA 140 06/08/2015 0840   K 4.2 06/28/2021 0931   CL 104 06/28/2021 0931   CO2 27 06/28/2021 0931   BUN 21 06/28/2021 0931   BUN 15 06/08/2015 0840   CREATININE 1.58 (H) 06/28/2021 0931   CREATININE 1.06 10/01/2016 0855      Component Value Date/Time   CALCIUM 9.5 06/28/2021 0931   ALKPHOS 72 06/28/2021 0931   AST 14 (L) 06/28/2021 0931   ALT 13 06/28/2021 0931   BILITOT 0.3 06/28/2021 0931       RADIOGRAPHIC STUDIES: CT Abdomen Pelvis Wo Contrast  Result Date: 07/17/2021 CLINICAL DATA:  Primary Cancer Type: Lung Imaging Indication: Assess response to therapy Interval therapy since last imaging? Yes Initial Cancer Diagnosis Date: 06/24/2019; Established by: Biopsy-proven Detailed  Pathology: Stage IV non-small cell lung cancer, poorly differentiated adenocarcinoma. Primary Tumor location: Mediastinal lymphadenopathy; metastatic liver lesions. Surgeries: Coronary stent. Chemotherapy: Yes; Ongoing?  No; Most recent administration: 01/2020 Immunotherapy?  Yes; Type: Keytruda; Ongoing? Yes Radiation therapy? No EXAM: CT CHEST, ABDOMEN AND PELVIS WITHOUT CONTRAST TECHNIQUE: Multidetector CT imaging of the chest, abdomen and pelvis was performed following the standard protocol without IV contrast. RADIATION DOSE REDUCTION: This exam was performed according to the departmental dose-optimization program which includes automated exposure control, adjustment of the mA and/or kV according to patient size and/or use of iterative reconstruction technique. COMPARISON:  Most recent CT chest, abdomen and pelvis 04/02/2021. 06/07/2019 PET-CT. FINDINGS: CT CHEST FINDINGS Cardiovascular: Port in the anterior chest wall with tip in distal SVC. Coronary artery calcification and aortic atherosclerotic calcification. Mediastinum/Nodes: Lymph node along the descending thoracic aorta adjacent to the esophagus measures 9 mm (45/2) not changed from 8 mm on prior. Small paratracheal node with fatty hilum is unchanged. No new lymph nodes in the mediastinum or hilum. Lungs/Pleura: Focus of linear scarring along the RIGHT horizontal fissure is unchanged. No new or suspicious pulmonary nodules. Airways normal. Musculoskeletal: No aggressive osseous lesion. CT ABDOMEN AND PELVIS FINDINGS Hepatobiliary: Hypodense lesion in the liver measuring 2.4 cm (60/2) is unchanged. Gallbladder normal. Pancreas: Pancreas is normal. No ductal dilatation. No pancreatic inflammation. Spleen: Normal spleen Adrenals/urinary tract: Adrenal glands and kidneys are normal. The ureters and bladder normal. Stomach/Bowel: Stomach, small bowel, appendix, and cecum are normal. The colon and rectosigmoid colon are normal. Vascular/Lymphatic: Abdominal  aorta is normal caliber with atherosclerotic calcification. There is no retroperitoneal or periportal lymphadenopathy. No pelvic lymphadenopathy. Several lymph nodes along the celiac axis are not pathologic by size criteria and unchanged from prior. Reproductive: Prostate normal Other: No free fluid. Musculoskeletal: No aggressive osseous lesion. IMPRESSION: Chest Impression: 1. No evidence of lung cancer recurrence. 2. Stable periaortic mediastinal lymph node. 3. Coronary artery calcification and Aortic Atherosclerosis (ICD10-I70.0). Abdomen / Pelvis Impression: 1. Low-density lesion in the liver previously characterized as benign hemangioma. 2. Small celiac lymph nodes are unchanged. 3. No evidence of metastatic lung cancer in the abdomen or pelvis. Electronically Signed   By: Suzy Bouchard M.D.   On: 07/17/2021 11:20   CT Chest Wo Contrast  Result Date: 07/17/2021 CLINICAL DATA:  Primary Cancer Type: Lung Imaging Indication: Assess response to therapy Interval therapy since last imaging? Yes Initial Cancer Diagnosis Date: 06/24/2019; Established by: Biopsy-proven Detailed Pathology: Stage IV non-small cell lung cancer, poorly differentiated adenocarcinoma. Primary Tumor location: Mediastinal lymphadenopathy; metastatic liver lesions. Surgeries: Coronary stent. Chemotherapy: Yes; Ongoing?  No; Most recent administration: 01/2020 Immunotherapy?  Yes; Type: Keytruda; Ongoing? Yes Radiation therapy? No EXAM: CT CHEST, ABDOMEN AND PELVIS WITHOUT  CONTRAST TECHNIQUE: Multidetector CT imaging of the chest, abdomen and pelvis was performed following the standard protocol without IV contrast. RADIATION DOSE REDUCTION: This exam was performed according to the departmental dose-optimization program which includes automated exposure control, adjustment of the mA and/or kV according to patient size and/or use of iterative reconstruction technique. COMPARISON:  Most recent CT chest, abdomen and pelvis 04/02/2021.  06/07/2019 PET-CT. FINDINGS: CT CHEST FINDINGS Cardiovascular: Port in the anterior chest wall with tip in distal SVC. Coronary artery calcification and aortic atherosclerotic calcification. Mediastinum/Nodes: Lymph node along the descending thoracic aorta adjacent to the esophagus measures 9 mm (45/2) not changed from 8 mm on prior. Small paratracheal node with fatty hilum is unchanged. No new lymph nodes in the mediastinum or hilum. Lungs/Pleura: Focus of linear scarring along the RIGHT horizontal fissure is unchanged. No new or suspicious pulmonary nodules. Airways normal. Musculoskeletal: No aggressive osseous lesion. CT ABDOMEN AND PELVIS FINDINGS Hepatobiliary: Hypodense lesion in the liver measuring 2.4 cm (60/2) is unchanged. Gallbladder normal. Pancreas: Pancreas is normal. No ductal dilatation. No pancreatic inflammation. Spleen: Normal spleen Adrenals/urinary tract: Adrenal glands and kidneys are normal. The ureters and bladder normal. Stomach/Bowel: Stomach, small bowel, appendix, and cecum are normal. The colon and rectosigmoid colon are normal. Vascular/Lymphatic: Abdominal aorta is normal caliber with atherosclerotic calcification. There is no retroperitoneal or periportal lymphadenopathy. No pelvic lymphadenopathy. Several lymph nodes along the celiac axis are not pathologic by size criteria and unchanged from prior. Reproductive: Prostate normal Other: No free fluid. Musculoskeletal: No aggressive osseous lesion. IMPRESSION: Chest Impression: 1. No evidence of lung cancer recurrence. 2. Stable periaortic mediastinal lymph node. 3. Coronary artery calcification and Aortic Atherosclerosis (ICD10-I70.0). Abdomen / Pelvis Impression: 1. Low-density lesion in the liver previously characterized as benign hemangioma. 2. Small celiac lymph nodes are unchanged. 3. No evidence of metastatic lung cancer in the abdomen or pelvis. Electronically Signed   By: Suzy Bouchard M.D.   On: 07/17/2021 11:20     ASSESSMENT AND PLAN: This is a very pleasant 66 years old African-American male recently diagnosed with a stage IV (TX, N3, M1c) non-small cell lung cancer, poorly differentiated adenocarcinoma presented with bulky mediastinal as well as right hilar, supraclavicular, abdominal and right axillary lymphadenopathy in addition to metastatic liver lesions diagnosed in January 2021. Molecular studies by guardant 360 shows no actionable mutations.  The patient is not a candidate for treatment with targeted therapy or enrollment in the clinical trial with the [facility] regimen. The patient is currently undergoing systemic chemotherapy with carboplatin for AUC of 5, Alimta 500 mg/M2 and Keytruda 200 mg IV every 3 weeks status post 35 cycles. Starting from cycle #5 he is treated with maintenance Alimta and Keytruda every 3 weeks.  Starting from cycle #12 the patient has been on treatment with single agent Keytruda because of the renal insufficiency. The patient completed 2 years of treatment with immunotherapy. He had repeat CT scan of the chest, abdomen pelvis performed recently.  I personally and independently reviewed the scan and discussed the result with the patient today. His scan showed no concerning findings for disease progression. I recommended for the patient to continue on observation from now on. We will continue to monitor him closely with repeat CT scan of the chest, abdomen pelvis in 3 months. I will arrange for the patient to have Port-A-Cath flush every 6 weeks. For the anemia of chronic disease, he will continue with the oral iron tablets and we will continue to monitor his hemoglobin  and hematocrit closely. For the renal insufficiency he is followed by nephrology. The patient was advised to call immediately if he has any other concerning symptoms in the interval.  The patient voices understanding of current disease status and treatment options and is in agreement with the current care  plan.  All questions were answered. The patient knows to call the clinic with any problems, questions or concerns. We can certainly see the patient much sooner if necessary.  Disclaimer: This note was dictated with voice recognition software. Similar sounding words can inadvertently be transcribed and may not be corrected upon review.

## 2021-07-19 NOTE — Addendum Note (Signed)
Addended by: Ardeen Garland on: 07/19/2021 10:25 AM   Modules accepted: Orders

## 2021-07-23 ENCOUNTER — Other Ambulatory Visit: Payer: Self-pay

## 2021-07-23 ENCOUNTER — Ambulatory Visit (INDEPENDENT_AMBULATORY_CARE_PROVIDER_SITE_OTHER): Payer: Medicare Other | Admitting: Nurse Practitioner

## 2021-07-23 ENCOUNTER — Encounter: Payer: Self-pay | Admitting: Nurse Practitioner

## 2021-07-23 VITALS — BP 129/75 | HR 80 | Temp 98.2°F | Ht 75.0 in | Wt 225.6 lb

## 2021-07-23 DIAGNOSIS — R052 Subacute cough: Secondary | ICD-10-CM

## 2021-07-23 DIAGNOSIS — R0989 Other specified symptoms and signs involving the circulatory and respiratory systems: Secondary | ICD-10-CM

## 2021-07-23 MED ORDER — HYDROCODONE BIT-HOMATROP MBR 5-1.5 MG/5ML PO SOLN: 5.0000 mL | Freq: Four times a day (QID) | ORAL | 0 refills | Status: DC | PRN

## 2021-07-23 MED ORDER — AMOXICILLIN-POT CLAVULANATE 875-125 MG PO TABS: 1.0000 | ORAL_TABLET | Freq: Two times a day (BID) | ORAL | 0 refills | Status: AC

## 2021-07-23 NOTE — Patient Instructions (Signed)

## 2021-07-23 NOTE — Progress Notes (Signed)
° °[facility] °[address] °[place], [place]  [address] °Phone:  [phone]   Fax:  [phone] ° ° °Established Patient Office Visit ° °Subjective:  °Patient ID: Kirk [name], male    DOB: 09/02/1955  Age: 65 y.o. MRN: [id number] ° °CC:  °Chief Complaint  °Patient presents with  ° Cough  °  Pt states that he was sick 2 weeks ago and has had a cough that want go away. Pt states that he was prescribed benzonatate and the doxycyline.  ° ° °HPI °Kirk [name] presents for follow up. He  has a past medical history of Arthritis, Atrial flutter with rapid ventricular response (HCC) (04/19/2019), Dizziness (10/2019), Dyspnea, Elevated serum creatinine (07/2020), HTN (hypertension), Hyperlipidemia, Lung cancer (HCC) (05/2019), Microalbuminuria (07/2020), NSTEMI (non-ST elevated myocardial infarction) (HCC) (09/04/2015), PAD (peripheral artery disease) (HCC), Refusal of blood transfusions as patient is Jehovah's Witness, Type II diabetes mellitus (HCC), and Vitamin D deficiency (07/2020).  ° °Upper Respiratory Infection °Patient complains of symptoms of a URI, follow up on a URI. Symptoms include cough described as productive of white sputum Onset of symptoms was 2 weeks ago, and  cough has gradually worsening since that time. Treatment to date: antibiotics and cough suppressants.  He did have a recent CT scan lung fields were negative.  He does continue to have increased congestion which is worse with lying daily. ° ° °Past Medical History:  °Diagnosis Date  ° Arthritis   ° "right knee" (09/04/2015)  ° Atrial flutter with rapid ventricular response (HCC) 04/19/2019  ° Dizziness 10/2019  ° Dyspnea   ° increased exertion  ° Elevated serum creatinine 07/2020  ° HTN (hypertension)   ° Hyperlipidemia   ° Lung cancer (HCC) 05/2019  ° Microalbuminuria 07/2020  ° NSTEMI (non-ST elevated myocardial infarction) (HCC) 09/04/2015  ° a. cath 09/05/2015: 95% stenosis mid-LAD, 55% mid-RCA, and 40% prox-RCA. PCI  performed w/ Synergy DES to LAD  ° PAD (peripheral artery disease) (HCC)   ° Stenting of bilateral iliacs in 2003.  ° Refusal of blood transfusions as patient is Jehovah's Witness   ° Type II diabetes mellitus (HCC)   ° Vitamin D deficiency 07/2020  ° ° °Past Surgical History:  °Procedure Laterality Date  ° CARDIAC CATHETERIZATION N/A 09/05/2015  ° Procedure: Left Heart Cath and Coronary Angiography;  Surgeon: Thomas A Kelly, MD;  [facility];  Service: Cardiovascular;  Laterality: N/A;  ° CARDIAC CATHETERIZATION N/A 09/05/2015  ° Procedure: Coronary Stent Intervention;  Surgeon: Thomas A Kelly, MD;  [facility];  Service: Cardiovascular;  Laterality: N/A;  ° IR IMAGING GUIDED PORT INSERTION  07/15/2019  ° ORIF CONGENITAL HIP DISLOCATION Left ~ 1965  ° "put pins in it"  ° PERIPHERAL VASCULAR CATHETERIZATION Bilateral 2003  ° "stenting"  ° TONSILLECTOMY  ~ 1963  ° ° °Family History  °Problem Relation Age of Onset  ° Lymphoma Mother   ° Cancer - Prostate Father   ° CAD Neg Hx   ° Colon cancer Neg Hx   ° Rectal cancer Neg Hx   ° Stomach cancer Neg Hx   ° Esophageal cancer Neg Hx   ° ° °Social History  ° °Socioeconomic History  ° Marital status: Married  °  Spouse name: Not on file  ° Number of children: 1  ° Years of education: Not on file  ° Highest education level: Not on file  °Occupational History  ° Occupation: Furniture salesman  °  ° °[facility] °[address] °[place], [place]  [address] °Phone:  [phone]   Fax:  [phone] ° ° °Established Patient Office Visit ° °Subjective:  °Patient ID: Kirk [name], male    DOB: 09/02/1955  Age: 65 y.o. MRN: [id number] ° °CC:  °Chief Complaint  °Patient presents with  ° Cough  °  Pt states that he was sick 2 weeks ago and has had a cough that want go away. Pt states that he was prescribed benzonatate and the doxycyline.  ° ° °HPI °Kirk [name] presents for follow up. He  has a past medical history of Arthritis, Atrial flutter with rapid ventricular response (HCC) (04/19/2019), Dizziness (10/2019), Dyspnea, Elevated serum creatinine (07/2020), HTN (hypertension), Hyperlipidemia, Lung cancer (HCC) (05/2019), Microalbuminuria (07/2020), NSTEMI (non-ST elevated myocardial infarction) (HCC) (09/04/2015), PAD (peripheral artery disease) (HCC), Refusal of blood transfusions as patient is Jehovah's Witness, Type II diabetes mellitus (HCC), and Vitamin D deficiency (07/2020).  ° °Upper Respiratory Infection °Patient complains of symptoms of a URI, follow up on a URI. Symptoms include cough described as productive of white sputum Onset of symptoms was 2 weeks ago, and  cough has gradually worsening since that time. Treatment to date: antibiotics and cough suppressants.  He did have a recent CT scan lung fields were negative.  He does continue to have increased congestion which is worse with lying daily. ° ° °Past Medical History:  °Diagnosis Date  ° Arthritis   ° "right knee" (09/04/2015)  ° Atrial flutter with rapid ventricular response (HCC) 04/19/2019  ° Dizziness 10/2019  ° Dyspnea   ° increased exertion  ° Elevated serum creatinine 07/2020  ° HTN (hypertension)   ° Hyperlipidemia   ° Lung cancer (HCC) 05/2019  ° Microalbuminuria 07/2020  ° NSTEMI (non-ST elevated myocardial infarction) (HCC) 09/04/2015  ° a. cath 09/05/2015: 95% stenosis mid-LAD, 55% mid-RCA, and 40% prox-RCA. PCI  performed w/ Synergy DES to LAD  ° PAD (peripheral artery disease) (HCC)   ° Stenting of bilateral iliacs in 2003.  ° Refusal of blood transfusions as patient is Jehovah's Witness   ° Type II diabetes mellitus (HCC)   ° Vitamin D deficiency 07/2020  ° ° °Past Surgical History:  °Procedure Laterality Date  ° CARDIAC CATHETERIZATION N/A 09/05/2015  ° Procedure: Left Heart Cath and Coronary Angiography;  Surgeon: Thomas A Kelly, MD;  [facility];  Service: Cardiovascular;  Laterality: N/A;  ° CARDIAC CATHETERIZATION N/A 09/05/2015  ° Procedure: Coronary Stent Intervention;  Surgeon: Thomas A Kelly, MD;  [facility];  Service: Cardiovascular;  Laterality: N/A;  ° IR IMAGING GUIDED PORT INSERTION  07/15/2019  ° ORIF CONGENITAL HIP DISLOCATION Left ~ 1965  ° "put pins in it"  ° PERIPHERAL VASCULAR CATHETERIZATION Bilateral 2003  ° "stenting"  ° TONSILLECTOMY  ~ 1963  ° ° °Family History  °Problem Relation Age of Onset  ° Lymphoma Mother   ° Cancer - Prostate Father   ° CAD Neg Hx   ° Colon cancer Neg Hx   ° Rectal cancer Neg Hx   ° Stomach cancer Neg Hx   ° Esophageal cancer Neg Hx   ° ° °Social History  ° °Socioeconomic History  ° Marital status: Married  °  Spouse name: Not on file  ° Number of children: 1  ° Years of education: Not on file  ° Highest education level: Not on file  °Occupational History  ° Occupation: Furniture salesman  °  ° °[facility] °[address] °[place], [place]  [address] °Phone:  [phone]   Fax:  [phone] ° ° °Established Patient Office Visit ° °Subjective:  °Patient ID: Kirk [name], male    DOB: 09/02/1955  Age: 65 y.o. MRN: [id number] ° °CC:  °Chief Complaint  °Patient presents with  ° Cough  °  Pt states that he was sick 2 weeks ago and has had a cough that want go away. Pt states that he was prescribed benzonatate and the doxycyline.  ° ° °HPI °Kirk [name] presents for follow up. He  has a past medical history of Arthritis, Atrial flutter with rapid ventricular response (HCC) (04/19/2019), Dizziness (10/2019), Dyspnea, Elevated serum creatinine (07/2020), HTN (hypertension), Hyperlipidemia, Lung cancer (HCC) (05/2019), Microalbuminuria (07/2020), NSTEMI (non-ST elevated myocardial infarction) (HCC) (09/04/2015), PAD (peripheral artery disease) (HCC), Refusal of blood transfusions as patient is Jehovah's Witness, Type II diabetes mellitus (HCC), and Vitamin D deficiency (07/2020).  ° °Upper Respiratory Infection °Patient complains of symptoms of a URI, follow up on a URI. Symptoms include cough described as productive of white sputum Onset of symptoms was 2 weeks ago, and  cough has gradually worsening since that time. Treatment to date: antibiotics and cough suppressants.  He did have a recent CT scan lung fields were negative.  He does continue to have increased congestion which is worse with lying daily. ° ° °Past Medical History:  °Diagnosis Date  ° Arthritis   ° "right knee" (09/04/2015)  ° Atrial flutter with rapid ventricular response (HCC) 04/19/2019  ° Dizziness 10/2019  ° Dyspnea   ° increased exertion  ° Elevated serum creatinine 07/2020  ° HTN (hypertension)   ° Hyperlipidemia   ° Lung cancer (HCC) 05/2019  ° Microalbuminuria 07/2020  ° NSTEMI (non-ST elevated myocardial infarction) (HCC) 09/04/2015  ° a. cath 09/05/2015: 95% stenosis mid-LAD, 55% mid-RCA, and 40% prox-RCA. PCI  performed w/ Synergy DES to LAD  ° PAD (peripheral artery disease) (HCC)   ° Stenting of bilateral iliacs in 2003.  ° Refusal of blood transfusions as patient is Jehovah's Witness   ° Type II diabetes mellitus (HCC)   ° Vitamin D deficiency 07/2020  ° ° °Past Surgical History:  °Procedure Laterality Date  ° CARDIAC CATHETERIZATION N/A 09/05/2015  ° Procedure: Left Heart Cath and Coronary Angiography;  Surgeon: Thomas A Kelly, MD;  [facility];  Service: Cardiovascular;  Laterality: N/A;  ° CARDIAC CATHETERIZATION N/A 09/05/2015  ° Procedure: Coronary Stent Intervention;  Surgeon: Thomas A Kelly, MD;  [facility];  Service: Cardiovascular;  Laterality: N/A;  ° IR IMAGING GUIDED PORT INSERTION  07/15/2019  ° ORIF CONGENITAL HIP DISLOCATION Left ~ 1965  ° "put pins in it"  ° PERIPHERAL VASCULAR CATHETERIZATION Bilateral 2003  ° "stenting"  ° TONSILLECTOMY  ~ 1963  ° ° °Family History  °Problem Relation Age of Onset  ° Lymphoma Mother   ° Cancer - Prostate Father   ° CAD Neg Hx   ° Colon cancer Neg Hx   ° Rectal cancer Neg Hx   ° Stomach cancer Neg Hx   ° Esophageal cancer Neg Hx   ° ° °Social History  ° °Socioeconomic History  ° Marital status: Married  °  Spouse name: Not on file  ° Number of children: 1  ° Years of education: Not on file  ° Highest education level: Not on file  °Occupational History  ° Occupation: Furniture salesman  °  ° °[facility] °[address] ° [place], [place]  [address] °Phone:  [phone]   Fax:  [phone] ° ° °Established Patient Office Visit ° °Subjective:  °Patient ID: Kirk [name], male    DOB: 09/02/1955  Age: 65 y.o. MRN: [id number] ° °CC:  °Chief Complaint  °Patient presents with  ° Cough  °  Pt states that he was sick 2 weeks ago and has had a cough that want go away. Pt states that he was prescribed benzonatate and the doxycyline.  ° ° °HPI °Kirk [name] presents for follow up. He  has a past medical history of Arthritis, Atrial flutter with rapid ventricular response (HCC) (04/19/2019), Dizziness (10/2019), Dyspnea, Elevated serum creatinine (07/2020), HTN (hypertension), Hyperlipidemia, Lung cancer (HCC) (05/2019), Microalbuminuria (07/2020), NSTEMI (non-ST elevated myocardial infarction) (HCC) (09/04/2015), PAD (peripheral artery disease) (HCC), Refusal of blood transfusions as patient is Jehovah's Witness, Type II diabetes mellitus (HCC), and Vitamin D deficiency (07/2020).  ° °Upper Respiratory Infection °Patient complains of symptoms of a URI, follow up on a URI. Symptoms include cough described as productive of white sputum Onset of symptoms was 2 weeks ago, and  cough has gradually worsening since that time. Treatment to date: antibiotics and cough suppressants.  He did have a recent CT scan lung fields were negative.  He does continue to have increased congestion which is worse with lying daily. ° ° °Past Medical History:  °Diagnosis Date  ° Arthritis   ° "right knee" (09/04/2015)  ° Atrial flutter with rapid ventricular response (HCC) 04/19/2019  ° Dizziness 10/2019  ° Dyspnea   ° increased exertion  ° Elevated serum creatinine 07/2020  ° HTN (hypertension)   ° Hyperlipidemia   ° Lung cancer (HCC) 05/2019  ° Microalbuminuria 07/2020  ° NSTEMI (non-ST elevated myocardial infarction) (HCC) 09/04/2015  ° a. cath 09/05/2015: 95% stenosis mid-LAD, 55% mid-RCA, and 40% prox-RCA. PCI  performed w/ Synergy DES to LAD  ° PAD (peripheral artery disease) (HCC)   ° Stenting of bilateral iliacs in 2003.  ° Refusal of blood transfusions as patient is Jehovah's Witness   ° Type II diabetes mellitus (HCC)   ° Vitamin D deficiency 07/2020  ° ° °Past Surgical History:  °Procedure Laterality Date  ° CARDIAC CATHETERIZATION N/A 09/05/2015  ° Procedure: Left Heart Cath and Coronary Angiography;  Surgeon: Thomas A Kelly, MD;  [facility];  Service: Cardiovascular;  Laterality: N/A;  ° CARDIAC CATHETERIZATION N/A 09/05/2015  ° Procedure: Coronary Stent Intervention;  Surgeon: Thomas A Kelly, MD;  [facility];  Service: Cardiovascular;  Laterality: N/A;  ° IR IMAGING GUIDED PORT INSERTION  07/15/2019  ° ORIF CONGENITAL HIP DISLOCATION Left ~ 1965  ° "put pins in it"  ° PERIPHERAL VASCULAR CATHETERIZATION Bilateral 2003  ° "stenting"  ° TONSILLECTOMY  ~ 1963  ° ° °Family History  °Problem Relation Age of Onset  ° Lymphoma Mother   ° Cancer - Prostate Father   ° CAD Neg Hx   ° Colon cancer Neg Hx   ° Rectal cancer Neg Hx   ° Stomach cancer Neg Hx   ° Esophageal cancer Neg Hx   ° ° °Social History  ° °Socioeconomic History  ° Marital status: Married  °  Spouse name: Not on file  ° Number of children: 1  ° Years of education: Not on file  ° Highest education level: Not on file  °Occupational History  ° Occupation: Furniture salesman  °

## 2021-08-09 ENCOUNTER — Ambulatory Visit: Payer: Medicare Other

## 2021-08-09 ENCOUNTER — Other Ambulatory Visit: Payer: Medicare Other

## 2021-08-09 ENCOUNTER — Ambulatory Visit: Payer: Medicare Other | Admitting: Internal Medicine

## 2021-08-13 ENCOUNTER — Other Ambulatory Visit: Payer: Self-pay | Admitting: Nurse Practitioner

## 2021-08-13 ENCOUNTER — Telehealth: Payer: Self-pay

## 2021-08-13 NOTE — Telephone Encounter (Signed)
Left a vm for the patient to give [place] a call back about the brand walker he is wanting. ?

## 2021-08-13 NOTE — Telephone Encounter (Signed)
Pt is asking to get a walker ?

## 2021-08-30 ENCOUNTER — Inpatient Hospital Stay: Payer: Medicare Other | Attending: Internal Medicine

## 2021-08-30 ENCOUNTER — Other Ambulatory Visit: Payer: Self-pay

## 2021-08-30 DIAGNOSIS — C3491 Malignant neoplasm of unspecified part of right bronchus or lung: Secondary | ICD-10-CM

## 2021-08-30 DIAGNOSIS — C787 Secondary malignant neoplasm of liver and intrahepatic bile duct: Secondary | ICD-10-CM | POA: Insufficient documentation

## 2021-08-30 DIAGNOSIS — R5383 Other fatigue: Secondary | ICD-10-CM

## 2021-08-30 DIAGNOSIS — Z79899 Other long term (current) drug therapy: Secondary | ICD-10-CM | POA: Insufficient documentation

## 2021-08-30 DIAGNOSIS — Z95828 Presence of other vascular implants and grafts: Secondary | ICD-10-CM

## 2021-08-30 LAB — CBC WITH DIFFERENTIAL (CANCER CENTER ONLY)
Abs Immature Granulocytes: 0.01 10*3/uL (ref 0.00–0.07)
Basophils Absolute: 0.1 10*3/uL (ref 0.0–0.1)
Basophils Relative: 1 %
Eosinophils Absolute: 0.2 10*3/uL (ref 0.0–0.5)
Eosinophils Relative: 3 %
HCT: 32.5 % — ABNORMAL LOW (ref 39.0–52.0)
Hemoglobin: 9.9 g/dL — ABNORMAL LOW (ref 13.0–17.0)
Immature Granulocytes: 0 %
Lymphocytes Relative: 37 %
Lymphs Abs: 2.6 10*3/uL (ref 0.7–4.0)
MCH: 25.4 pg — ABNORMAL LOW (ref 26.0–34.0)
MCHC: 30.5 g/dL (ref 30.0–36.0)
MCV: 83.3 fL (ref 80.0–100.0)
Monocytes Absolute: 0.7 10*3/uL (ref 0.1–1.0)
Monocytes Relative: 10 %
Neutro Abs: 3.5 10*3/uL (ref 1.7–7.7)
Neutrophils Relative %: 49 %
Platelet Count: 277 10*3/uL (ref 150–400)
RBC: 3.9 MIL/uL — ABNORMAL LOW (ref 4.22–5.81)
RDW: 17.4 % — ABNORMAL HIGH (ref 11.5–15.5)
WBC Count: 7 10*3/uL (ref 4.0–10.5)
nRBC: 0 % (ref 0.0–0.2)

## 2021-08-30 LAB — CMP (CANCER CENTER ONLY)
ALT: 13 U/L (ref 0–44)
AST: 15 U/L (ref 15–41)
Albumin: 4.3 g/dL (ref 3.5–5.0)
Alkaline Phosphatase: 62 U/L (ref 38–126)
Anion gap: 7 (ref 5–15)
BUN: 27 mg/dL — ABNORMAL HIGH (ref 8–23)
CO2: 26 mmol/L (ref 22–32)
Calcium: 9.5 mg/dL (ref 8.9–10.3)
Chloride: 106 mmol/L (ref 98–111)
Creatinine: 2.27 mg/dL — ABNORMAL HIGH (ref 0.61–1.24)
GFR, Estimated: 31 mL/min — ABNORMAL LOW (ref >60)
Glucose, Bld: 194 mg/dL — ABNORMAL HIGH (ref 70–99)
Potassium: 3.8 mmol/L (ref 3.5–5.1)
Sodium: 139 mmol/L (ref 135–145)
Total Bilirubin: 0.3 mg/dL (ref 0.3–1.2)
Total Protein: 8.1 g/dL (ref 6.5–8.1)

## 2021-08-30 LAB — TSH: TSH: 0.575 u[IU]/mL (ref 0.320–4.118)

## 2021-08-30 MED ORDER — SODIUM CHLORIDE 0.9% FLUSH
10.0000 mL | Freq: Once | INTRAVENOUS | Status: AC
  Administered 2021-08-30: 10 mL

## 2021-09-06 ENCOUNTER — Ambulatory Visit: Payer: Medicare Other | Admitting: Nurse Practitioner

## 2021-09-07 ENCOUNTER — Encounter: Payer: Self-pay | Admitting: Nurse Practitioner

## 2021-09-07 ENCOUNTER — Ambulatory Visit (INDEPENDENT_AMBULATORY_CARE_PROVIDER_SITE_OTHER): Payer: Medicare Other | Admitting: Nurse Practitioner

## 2021-09-07 VITALS — BP 123/72 | HR 83 | Temp 98.1°F | Ht 75.0 in | Wt 227.0 lb

## 2021-09-07 DIAGNOSIS — I10 Essential (primary) hypertension: Secondary | ICD-10-CM | POA: Diagnosis not present

## 2021-09-07 DIAGNOSIS — E1151 Type 2 diabetes mellitus with diabetic peripheral angiopathy without gangrene: Secondary | ICD-10-CM

## 2021-09-07 LAB — POCT GLYCOSYLATED HEMOGLOBIN (HGB A1C)
HbA1c POC (<> result, manual entry): 7.1 % (ref 4.0–5.6)
HbA1c, POC (controlled diabetic range): 7.1 % — AB (ref 0.0–7.0)
HbA1c, POC (prediabetic range): 7.1 % — AB (ref 5.7–6.4)
Hemoglobin A1C: 7.1 % — AB (ref 4.0–5.6)

## 2021-09-07 NOTE — Patient Instructions (Signed)
You were seen today in the [facility] for reevaluation of chronic illness . Labs were collected, results will be available via MyChart or, if abnormal, you will be contacted by clinic staff. Please follow up in 6 mths  for reevaluation of chronic illness. ?

## 2021-09-07 NOTE — Progress Notes (Signed)
? ?Kirk ?Kirk [name], [name]  [address] ?Phone:  [phone]   Fax:  [phone] ?Subjective:  ? Patient ID: Kirk [name], male    DOB: 1955/11/08, 66 y.o.   MRN: [id number] ? ?Chief Complaint  ?Patient presents with  ? Follow-up  ?  Patient is here today for his 3 month follow up visit with no concerns or issues to discuss today.  ? ?HPI ?Kirk [name] 66 y.o. male  has a past medical history of Arthritis, Atrial flutter with rapid ventricular response ([place]) (04/19/2019), Dizziness (10/2019), Dyspnea, Elevated serum creatinine (07/2020), HTN (hypertension), Hyperlipidemia, Lung cancer ([place]) (05/2019), Microalbuminuria (07/2020), NSTEMI (non-ST elevated myocardial infarction) ([place]) (09/04/2015), PAD (peripheral artery disease) ([place]), Refusal of blood transfusions as patient is Jehovah's Witness, Type II diabetes mellitus ([place]), and Vitamin D deficiency (07/2020). To the [facility] for reevaluation of DM2.  ? ?Patient states that he was diagnosed and treated for stage 4 lung cancer. States that he completed six rounds of chemotherapy and oral therapy, last dose 2 mths ago. Was informed by oncologist that cancer is stable, but not in remission. Completes follow up every 3 mths for reevaluation of lung cancer.  ? ?Diabetes Mellitus: Patient presents for follow up of diabetes. Symptoms: none.  Patient denies foot ulcerations, hypoglycemia , nausea, and polydipsia.  Evaluation to date has been included: hemoglobin A1C.  Home sugars: BGs range between 90 and 140 . Treatment to date: no recent interventions. Denies monitoring meals, but walks his dog daily.  ? ?Denies any other concerns today. Denies any fatigue, chest pain, shortness of breath, HA or dizziness. Denies any blurred vision, numbness or tingling. ? ?Past Medical History:  ?Diagnosis Date  ? Arthritis   ? "right knee" (09/04/2015)  ? Atrial flutter with rapid ventricular response ([place]) 04/19/2019  ? Dizziness 10/2019  ?  Dyspnea   ? increased exertion  ? Elevated serum creatinine 07/2020  ? HTN (hypertension)   ? Hyperlipidemia   ? Lung cancer ([place]) 05/2019  ? Microalbuminuria 07/2020  ? NSTEMI (non-ST elevated myocardial infarction) ([place]) 09/04/2015  ? a. cath 09/05/2015: 95% stenosis mid-LAD, 55% mid-RCA, and 40% prox-RCA. PCI performed w/ Synergy DES to LAD  ? PAD (peripheral artery disease) ([place])   ? Stenting of bilateral iliacs in 2003.  ? Refusal of blood transfusions as patient is Jehovah's Witness   ? Type II diabetes mellitus ([place])   ? Vitamin D deficiency 07/2020  ? ? ?Past Surgical History:  ?Procedure Laterality Date  ? CARDIAC CATHETERIZATION N/A 09/05/2015  ? Procedure: Left Heart Cath and Coronary Angiography;  Surgeon: Troy Sine, MD;  Location: [place] [facility];  Service: Cardiovascular;  Laterality: N/A;  ? CARDIAC CATHETERIZATION N/A 09/05/2015  ? Procedure: Coronary Stent Intervention;  Surgeon: Troy Sine, MD;  Location: [place] [facility];  Service: Cardiovascular;  Laterality: N/A;  ? IR IMAGING GUIDED PORT INSERTION  07/15/2019  ? ORIF CONGENITAL HIP DISLOCATION Left ~ 1965  ? "put pins in it"  ? PERIPHERAL VASCULAR CATHETERIZATION Bilateral 2003  ? "stenting"  ? TONSILLECTOMY  ~ 1963  ? ? ?Family History  ?Problem Relation Age of Onset  ? Lymphoma Mother   ? Cancer - Prostate Father   ? CAD Neg Hx   ? Colon cancer Neg Hx   ? Rectal cancer Neg Hx   ? Stomach cancer Neg Hx   ? Esophageal cancer Neg Hx   ? ? ?Social History  ? ?  Socioeconomic History  ? Marital status: Married  ?  Spouse name: Not on file  ? Number of children: 1  ? Years of education: Not on file  ? Highest education level: Not on file  ?Occupational History  ? Occupation: Acupuncturist  ?Tobacco Use  ? Smoking status: Former  ?  Packs/day: 2.00  ?  Years: 35.00  ?  Pack years: 70.00  ?  Types: Cigarettes  ?  Quit date: 02/19/2003  ?  Years since quitting: 18.5  ? Smokeless tobacco: Never  ?Vaping Use  ? Vaping Use: Never used   ?Substance and Sexual Activity  ? Alcohol use: Not Currently  ?  Alcohol/week: 0.0 standard drinks  ? Drug use: Not Currently  ?  Types: Marijuana  ? Sexual activity: Not Currently  ?Other Topics Concern  ? Not on file  ?Social History Narrative  ? Not on file  ? ?Social Determinants of Health  ? ?Financial Resource Strain: Low Risk   ? Difficulty of Paying Living Expenses: Not hard at all  ?Food Insecurity: No Food Insecurity  ? Worried About Charity fundraiser in the Last Year: Never true  ? Ran Out of Food in the Last Year: Never true  ?Transportation Needs: No Transportation Needs  ? Lack of Transportation (Medical): No  ? Lack of Transportation (Non-Medical): No  ?Physical Activity: Sufficiently Active  ? Days of Exercise per Week: 5 days  ? Minutes of Exercise per Session: 150+ min  ?Stress: No Stress Concern Present  ? Feeling of Stress : Not at all  ?Social Connections: Moderately Integrated  ? Frequency of Communication with Friends and Family: More than three times a week  ? Frequency of Social Gatherings with Friends and Family: More than three times a week  ? Attends Religious Services: More than 4 times per year  ? Active Member of Clubs or Organizations: No  ? Attends Archivist Meetings: Never  ? Marital Status: Married  ?Intimate Partner Violence: Not At Risk  ? Fear of Current or Ex-Partner: No  ? Emotionally Abused: No  ? Physically Abused: No  ? Sexually Abused: No  ? ? ?Outpatient Medications Prior to Visit  ?Medication Sig Dispense Refill  ? acetaminophen (TYLENOL) 500 MG tablet Take 500 mg by mouth every 6 (six) hours as needed for moderate pain or fever.     ? aspirin EC 81 MG tablet Take 81 mg by mouth at bedtime.    ? blood glucose meter kit and supplies Dispense based on patient and insurance preference. Use up to four times daily as directed. (FOR ICD-10 E10.9, E11.9). 1 each 0  ? carvedilol (COREG) 12.5 MG tablet Take 1 tablet (12.5 mg total) by mouth 2 (two) times daily with  a meal. 180 tablet 3  ? clopidogrel (PLAVIX) 75 MG tablet Take 1 tablet (75 mg total) by mouth daily. 90 tablet 3  ? docusate sodium (COLACE) 100 MG capsule Take 1 capsule (100 mg total) by mouth 2 (two) times daily as needed for mild constipation. 180 capsule 3  ? ferrous sulfate 324 MG TBEC Take 1 tablet (324 mg total) by mouth daily with breakfast. 30 tablet 11  ? gabapentin (NEURONTIN) 300 MG capsule Take 1 capsule (300 mg total) by mouth 3 (three) times daily. 270 capsule 3  ? glimepiride (AMARYL) 4 MG tablet Take 1 tablet (4 mg total) by mouth daily. 90 tablet 3  ? glucose blood (ACCU-CHEK GUIDE) test strip Use as instructed  up to four times daily as directed. 100 each 12  ? HYDROcodone bit-homatropine (HYCODAN) 5-1.5 MG/5ML syrup Take 5 mLs by mouth every 6 (six) hours as needed for cough. 120 mL 0  ? lidocaine-prilocaine (EMLA) cream APPLY TO THE PORT-A-CATH SITE 30-60 MINUTES BEFORE TREATMENT. (Patient taking differently: Apply 1 application. topically daily as needed (access port). Apply to the Port-A-Cath site 30-60 minutes before treatment.) 30 g 0  ? metFORMIN (GLUCOPHAGE) 1000 MG tablet Take 1,000 mg by mouth 2 (two) times daily.    ? multivitamin (ONE-A-DAY MEN'S) TABS tablet Take 1 tablet by mouth daily. 30 tablet 6  ? nitroGLYCERIN (NITROSTAT) 0.4 MG SL tablet Place 1 tablet (0.4 mg total) under the tongue every 5 (five) minutes x 3 doses as needed for chest pain. 25 tablet 2  ? rosuvastatin (CRESTOR) 40 MG tablet Take 1 tablet (40 mg total) by mouth daily. 90 tablet 3  ? benzonatate (TESSALON PERLES) 100 MG capsule Take 1 capsule (100 mg total) by mouth 3 (three) times daily as needed. (Patient not taking: Reported on 07/23/2021) 20 capsule 0  ? ?No facility-administered medications prior to visit.  ? ? ?No Known Allergies ? ?Review of Systems  ?Constitutional:  Negative for chills, fever and malaise/fatigue.  ?Respiratory:  Positive for sputum production. Negative for cough and shortness of breath.    ?     Endorses having intermittent shortness of breath, but suspects it is related to lung cacer diagnosis  ?Cardiovascular:  Negative for chest pain, palpitations and leg swelling.  ?Gastrointestinal:

## 2021-09-09 LAB — MICROALBUMIN / CREATININE URINE RATIO
Creatinine, Urine: 77.6 mg/dL
Microalb/Creat Ratio: 249 mg/g creat — ABNORMAL HIGH (ref 0–29)
Microalbumin, Urine: 193.5 ug/mL

## 2021-10-07 IMAGING — CT NM PET TUM IMG INITIAL (PI) SKULL BASE T - THIGH
6 of 7 series · 17 of 25 positions shown · non-contrast
Comparison: CT chest May 26, 2019

CLINICAL DATA: Initial treatment strategy for mediastinal
adenopathy.

EXAM:
NUCLEAR MEDICINE PET SKULL BASE TO THIGH
TECHNIQUE: 10.9 mCi F-18 FDG was injected intravenously. Full-ring PET imaging
was performed from the skull base to thigh after the radiotracer. CT
data was obtained and used for attenuation correction and anatomic
localization.
Fasting blood glucose: 151 mg/dl

[Series 4: ct sk_thigh 5.0 hd_fov · axial · 5.0mm · 1.07mm/px · z∈[-1000,-44]mm · 4 of 240 slices shown]
[im 1/240]
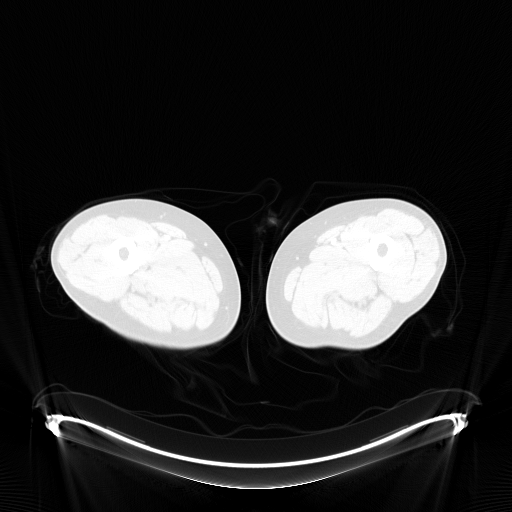
[im 96/240]
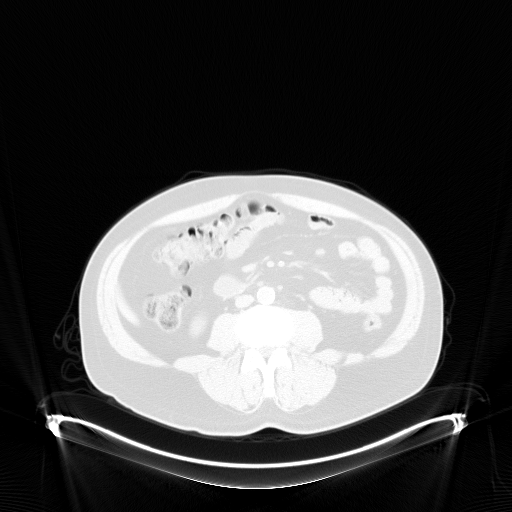
[im 144/240]
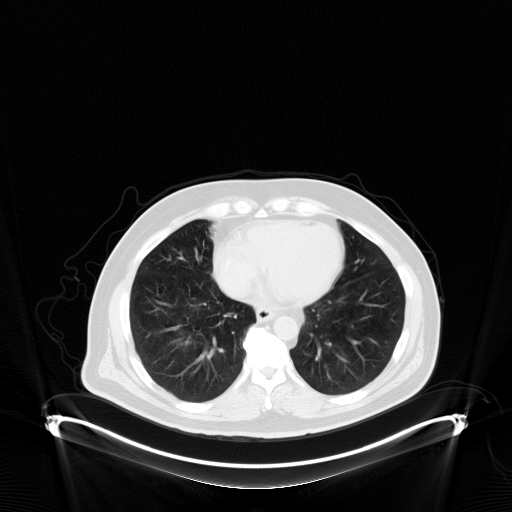
[im 240/240  brain]
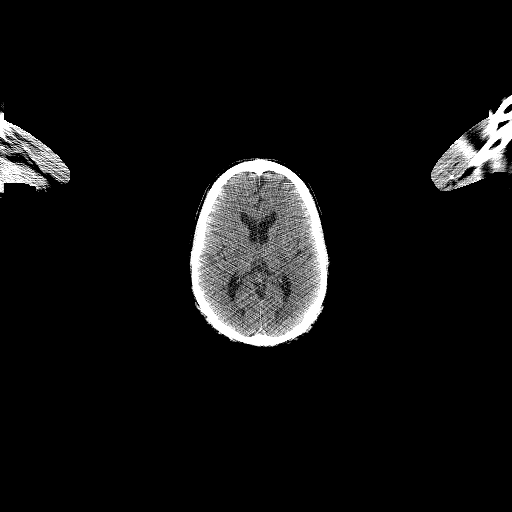

[Series 5: pet sk_thigh nac · axial · 5.0mm · 4.07mm/px · z∈[-1000,-44]mm · 5 of 240 slices shown]
[im 1/240]
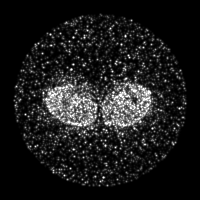
[im 80/240]
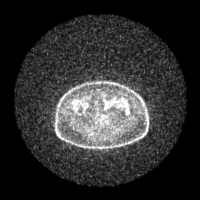
[im 120/240]
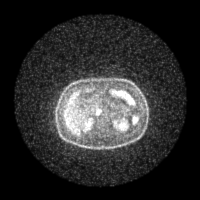
[im 200/240]
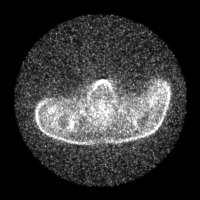
[im 240/240]
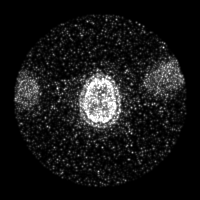

[Series 8: ct sk_thigh 5.0 (id) lung_bone · axial · 5.0mm · 0.77mm/px · 1 of 73 slices shown]
[im 1/73  bone]
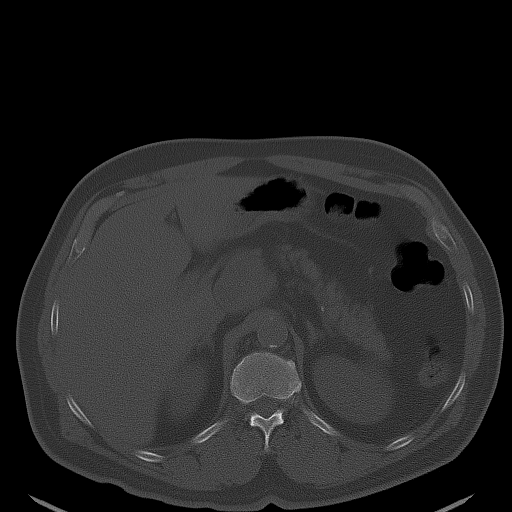

[Series 603: range-ct sk_thigh 5.0 hd_fov-cor-<alpha range> · 2 of 88 slices shown]
[im 1/88]
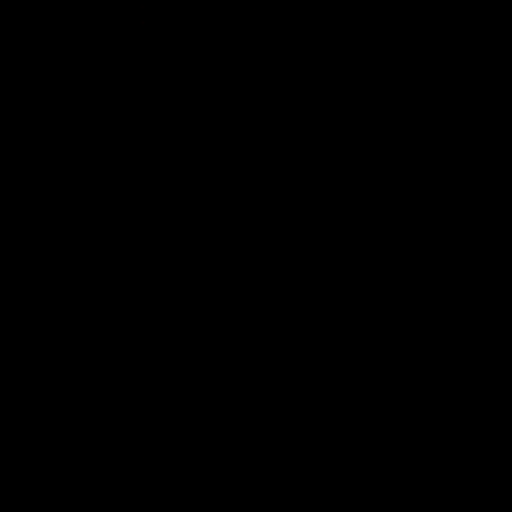
[im 88/88]
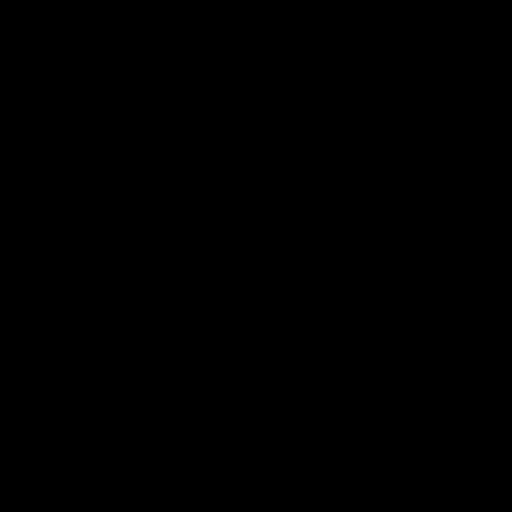

[Series 605: range-ct sk_thigh 5.0 hd_fov-tra-<alpha range> · 4 of 228 slices shown]
[im 1/228]
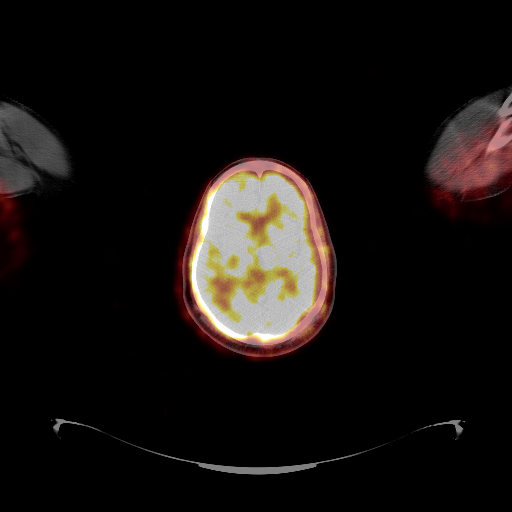
[im 46/228]
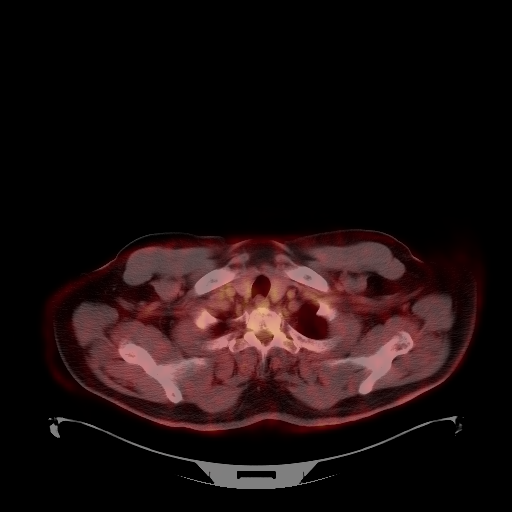
[im 137/228]
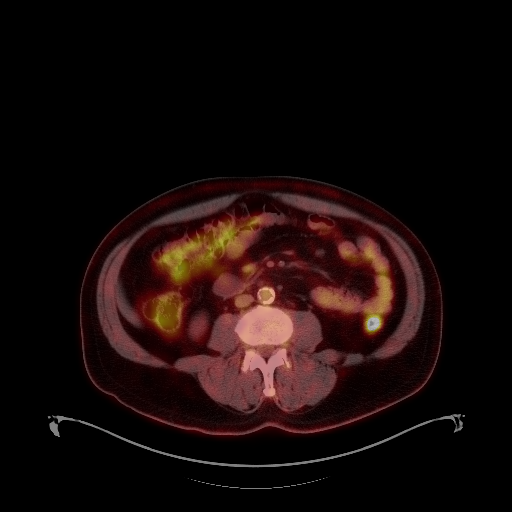
[im 182/228]
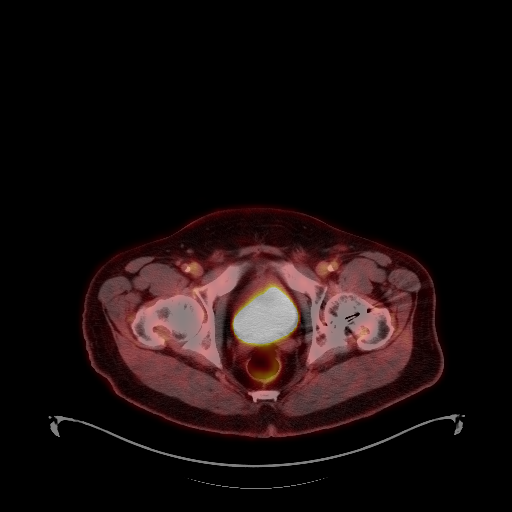

[Series 1087: results mm oncology reading · 1.1mm · 0.65mm/px · 1 of 12 slices shown]
[im 1/12]
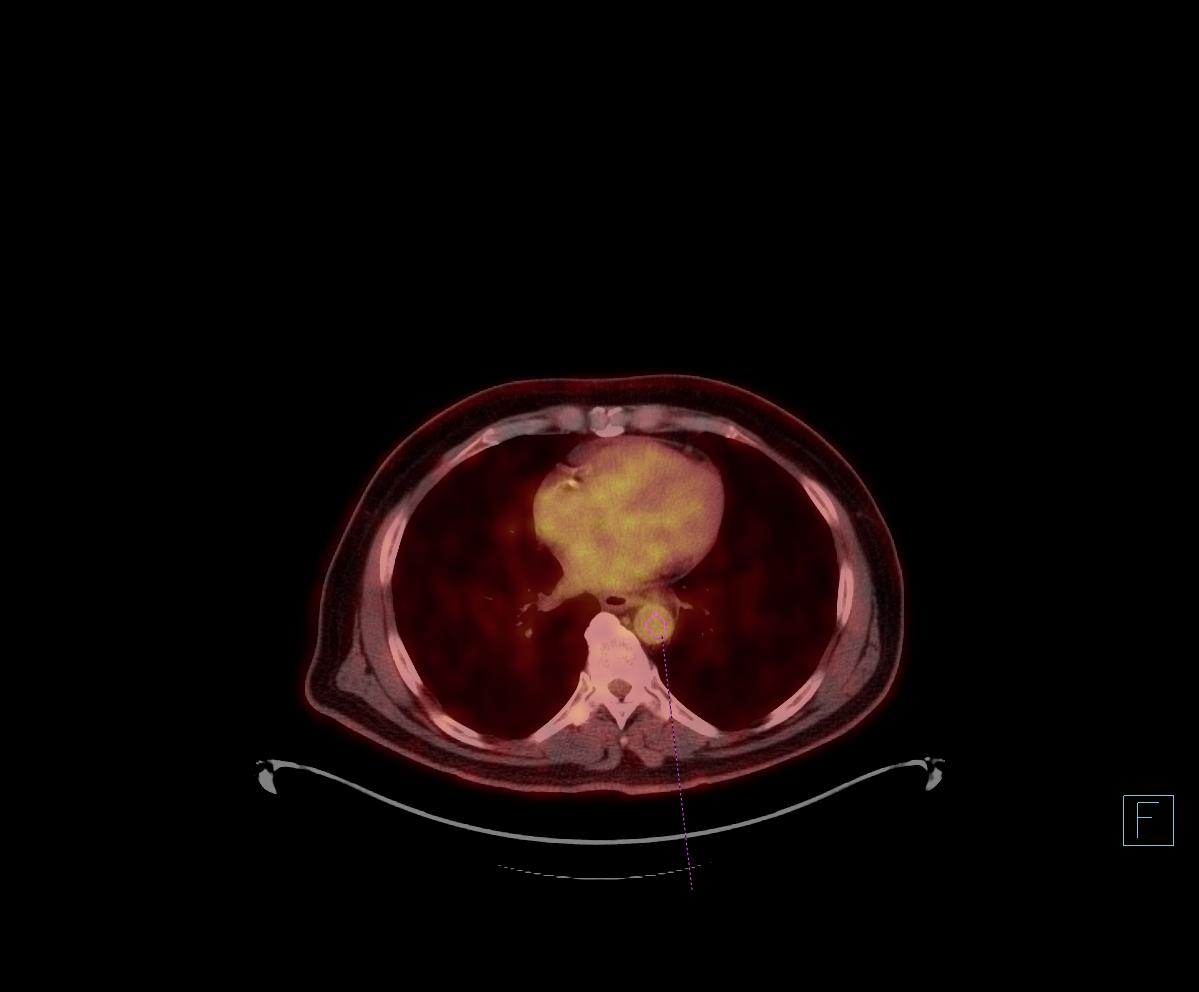

[17 of 25 positions shown; findings below may reference images not displayed]

FINDINGS: Mediastinal blood pool activity: SUV max

Liver activity: SUV max

NECK: Bilateral low neck/supraclavicular lymph nodes with increased
FDG uptake. (SUVmax = 9.34) with respect to the left 7 mm lymph
node, similar activity seen on the right in a smaller right
supraclavicular lymph node

No additional areas suspicious hypermetabolic activity in the neck.
Nonspecific parapharyngeal activity bilaterally with slight
asymmetry favoring right over the left. Max SUV

Incidental CT findings: Calcified atherosclerotic changes throughout
the carotid arteries, mild-to-moderate. Scattered smaller lymph
nodes without significant increased FDG uptake.

CHEST: Bulky subcarinal nodal mass. Left and right superior
mediastinal lymph nodes with increased FDG uptake and right hilar
nodal disease also with increased FDG uptake.

Right axillary lymph node with increased metabolic activity.

(Image 80, series [DATE] x 4.5 cm mmm subcarinal nodal mass, stable
in terms of size compared to recent chest CT, contiguous with lymph
nodes tracking along the left mainstem bronchus. (SUVmax = 16.66)

(Image 64, series 4) bulky retrotracheal lymph node 2.3 cm stable
compared to recent comparison study (SUVmax = 14.44)

Right axillary lymph node (image 60, series 4) 9 mm (SUVmax = 7.17)

Numerous other lymph nodes in the mediastinum with intense FDG
uptake in the superior mediastinum just below the thoracic inlet.
Also along the right paratracheal chain. These are smaller than
previously described lymph nodes.

Right infrahilar lymph node or central pulmonary nodule (image 89,
series 4) (SUVmax =

Smaller lymph nodes in the right hilum, difficult to measure given
lack of contrast and small size also with intense FDG uptake. No
visible left hilar adenopathy.

Small lymph nodes adjacent to esophagus just above the esophageal
hiatus, largest 10 mm without increased FDG uptake.)

Incidental CT findings: Signs of scattered atherosclerotic change.
Calcified coronary artery disease. No signs of aneurysm in the
chest. No signs of pericardial effusion.

Other than central hilar lymph node or small nodule described above
there is no suspicious mass or pulmonary nodule on today's study,
limited assessment due to motion artifact. No consolidation or
effusion. No signs of chest wall mass.

ABDOMEN/PELVIS: Hepatogastric lymph nodes, retroperitoneal lymph
nodes all with hypermetabolic characteristics.

Liver metastases also with increased FDG uptake.

(Image 116, series [DATE] x 4.2 cm hepatic gastric lymph node
showing low attenuation throughout but showing diffuse hyper
metabolic features (SUVmax = 11.35)

Right juxta-crural lymph node 16 mm (image 122, series 4) (SUVmax =
17.6.

Similar sized lymph node in the retrocaval region with similar FDG
uptake (image 131, series 4))

With respect to liver lesions the largest along the course of middle
hepatic vein measures 2.5 cm, difficult to compared to prior study
given different phase of enhancement but approximately 2.2 cm (image
109, series 4) (SUVmax = 11.67)

Second lesion along the right hepatic margin, smaller (image 104,
series [DATE] cm on today's study, again difficult to compare to the
prior study where it measured approximately 1.4 cm. (SUVmax = 6.25)

Hypodense lesion in the right hepatic lobe inferiorly along the
gallbladder fossa measuring 1.8 cm without FDG uptake above
background liver activity.

Incidental CT findings: Extensive calcific atherosclerotic changes
throughout the abdominal aorta extending in the iliac vessels, no
signs of aneurysm.

SKELETON: No focal hypermetabolic activity to suggest skeletal
metastasis.w

Incidental CT findings: Left femoral ORIF with screws traversing the
left femoral neck. No destructive bone process.
IMPRESSION: 1. Bulky mediastinal adenopathy and right hilar adenopathy, small
supraclavicular lymph nodes and liver metastases with intense
metabolic activity. Small cell lung cancer remains a differential
consideration. However, in the presence of a large hepatogastric
nodal mass and retroperitoneal adenopathy gastrointestinal or
abdominal malignancy is considered though there are no CT or PET
findings that would allow for localization within the abdomen or
pelvis.
2. Asymmetric right greater than left parapharyngeal activity is
nonspecific, attention on follow-up.

## 2021-10-12 ENCOUNTER — Telehealth: Payer: Self-pay | Admitting: Internal Medicine

## 2021-10-12 ENCOUNTER — Inpatient Hospital Stay: Payer: Medicare Other

## 2021-10-12 NOTE — Telephone Encounter (Signed)
Called patient regarding upcoming appointment, voicemail was left.

## 2021-10-15 ENCOUNTER — Ambulatory Visit (HOSPITAL_COMMUNITY)
Admission: RE | Admit: 2021-10-15 | Discharge: 2021-10-15 | Disposition: A | Discharge status: Home / Self Care | Payer: Medicare Other | Source: Ambulatory Visit | Attending: Internal Medicine | Admitting: Internal Medicine

## 2021-10-15 ENCOUNTER — Other Ambulatory Visit: Payer: Self-pay

## 2021-10-15 ENCOUNTER — Inpatient Hospital Stay: Payer: Medicare Other | Attending: Internal Medicine

## 2021-10-15 DIAGNOSIS — N289 Disorder of kidney and ureter, unspecified: Secondary | ICD-10-CM | POA: Diagnosis not present

## 2021-10-15 DIAGNOSIS — C3490 Malignant neoplasm of unspecified part of unspecified bronchus or lung: Secondary | ICD-10-CM | POA: Insufficient documentation

## 2021-10-15 DIAGNOSIS — C787 Secondary malignant neoplasm of liver and intrahepatic bile duct: Secondary | ICD-10-CM | POA: Insufficient documentation

## 2021-10-15 DIAGNOSIS — C3491 Malignant neoplasm of unspecified part of right bronchus or lung: Secondary | ICD-10-CM | POA: Diagnosis present

## 2021-10-15 DIAGNOSIS — D649 Anemia, unspecified: Secondary | ICD-10-CM | POA: Insufficient documentation

## 2021-10-15 DIAGNOSIS — I10 Essential (primary) hypertension: Secondary | ICD-10-CM | POA: Diagnosis not present

## 2021-10-15 DIAGNOSIS — Z95828 Presence of other vascular implants and grafts: Secondary | ICD-10-CM

## 2021-10-15 DIAGNOSIS — E1151 Type 2 diabetes mellitus with diabetic peripheral angiopathy without gangrene: Secondary | ICD-10-CM | POA: Diagnosis not present

## 2021-10-15 LAB — CMP (CANCER CENTER ONLY)
ALT: 14 U/L (ref 0–44)
AST: 15 U/L (ref 15–41)
Albumin: 4.4 g/dL (ref 3.5–5.0)
Alkaline Phosphatase: 52 U/L (ref 38–126)
Anion gap: 8 (ref 5–15)
BUN: 33 mg/dL — ABNORMAL HIGH (ref 8–23)
CO2: 27 mmol/L (ref 22–32)
Calcium: 10.2 mg/dL (ref 8.9–10.3)
Chloride: 106 mmol/L (ref 98–111)
Creatinine: 2.52 mg/dL — ABNORMAL HIGH (ref 0.61–1.24)
GFR, Estimated: 27 mL/min — ABNORMAL LOW (ref >60)
Glucose, Bld: 113 mg/dL — ABNORMAL HIGH (ref 70–99)
Potassium: 3.9 mmol/L (ref 3.5–5.1)
Sodium: 141 mmol/L (ref 135–145)
Total Bilirubin: 0.4 mg/dL (ref 0.3–1.2)
Total Protein: 8.3 g/dL — ABNORMAL HIGH (ref 6.5–8.1)

## 2021-10-15 LAB — CBC WITH DIFFERENTIAL (CANCER CENTER ONLY)
Abs Immature Granulocytes: 0.01 10*3/uL (ref 0.00–0.07)
Basophils Absolute: 0.1 10*3/uL (ref 0.0–0.1)
Basophils Relative: 1 %
Eosinophils Absolute: 0.2 10*3/uL (ref 0.0–0.5)
Eosinophils Relative: 2 %
HCT: 30.3 % — ABNORMAL LOW (ref 39.0–52.0)
Hemoglobin: 9.5 g/dL — ABNORMAL LOW (ref 13.0–17.0)
Immature Granulocytes: 0 %
Lymphocytes Relative: 38 %
Lymphs Abs: 3 10*3/uL (ref 0.7–4.0)
MCH: 26.2 pg (ref 26.0–34.0)
MCHC: 31.4 g/dL (ref 30.0–36.0)
MCV: 83.7 fL (ref 80.0–100.0)
Monocytes Absolute: 0.7 10*3/uL (ref 0.1–1.0)
Monocytes Relative: 8 %
Neutro Abs: 4 10*3/uL (ref 1.7–7.7)
Neutrophils Relative %: 51 %
Platelet Count: 271 10*3/uL (ref 150–400)
RBC: 3.62 MIL/uL — ABNORMAL LOW (ref 4.22–5.81)
RDW: 16 % — ABNORMAL HIGH (ref 11.5–15.5)
WBC Count: 8 10*3/uL (ref 4.0–10.5)
nRBC: 0 % (ref 0.0–0.2)

## 2021-10-15 MED ORDER — HEPARIN SOD (PORK) LOCK FLUSH 100 UNIT/ML IV SOLN
INTRAVENOUS | Status: AC
  Filled 2021-10-15: qty 5

## 2021-10-15 MED ORDER — HEPARIN SOD (PORK) LOCK FLUSH 100 UNIT/ML IV SOLN
500.0000 [IU] | Freq: Once | INTRAVENOUS | Status: AC
  Administered 2021-10-15: 500 [IU] via INTRAVENOUS

## 2021-10-15 MED ORDER — SODIUM CHLORIDE 0.9% FLUSH
10.0000 mL | Freq: Once | INTRAVENOUS | Status: AC
  Administered 2021-10-15: 10 mL

## 2021-10-16 ENCOUNTER — Inpatient Hospital Stay (HOSPITAL_BASED_OUTPATIENT_CLINIC_OR_DEPARTMENT_OTHER): Payer: Medicare Other | Admitting: Internal Medicine

## 2021-10-16 VITALS — BP 152/84 | HR 79 | Temp 97.2°F | Resp 16 | Wt 223.5 lb

## 2021-10-16 DIAGNOSIS — C3490 Malignant neoplasm of unspecified part of unspecified bronchus or lung: Secondary | ICD-10-CM

## 2021-10-16 DIAGNOSIS — C3491 Malignant neoplasm of unspecified part of right bronchus or lung: Secondary | ICD-10-CM | POA: Diagnosis not present

## 2021-10-16 NOTE — Progress Notes (Signed)
[facility] [phone]   [phone]  OFFICE PROGRESS NOTE  Bo Merino I, NP [facility] [address] [name] [address]  DIAGNOSIS: Stage IV (TX, N3, M1c) non-small cell lung cancer, poorly differentiated adenocarcinoma presented with bulky mediastinal lymphadenopathy in addition to right axillary lymphadenopathy and metastatic liver lesions, abdominal lymphadenopathy diagnosed in January 2021. Molecular studies by [employer] that shows no actionable mutations.  PRIOR THERAPY: Systemic chemotherapy with carboplatin for AUC of 5, Alimta 500 mg/M2 and Keytruda 200 mg IV every 3 weeks.  First dose July 14, 2019.  Status post 35  cycles. Starting from cycle #5 the patient will be treated with maintenance Alimta and Keytruda every 3 weeks.  Starting from cycle #12 he will be on single agent treatment with Keytruda secondary to renal insufficiency.  CURRENT THERAPY: Observation  INTERVAL HISTORY: Kirk [name] 66 y.o. male returns to the clinic today for follow-up visit.  The patient is feeling fine today with no concerning complaints except for mild fatigue.  He denied having any chest pain, shortness of breath, cough or hemoptysis.  He has no nausea, vomiting, diarrhea or constipation.  He has no headache or visual changes.  He has no weight loss or night sweats.  He denied having any fever or chills.  He had repeat CT scan of the chest, abdomen and pelvis performed recently and he is here for evaluation and discussion of the scan results.  MEDICAL HISTORY: Past Medical History:  Diagnosis Date   Arthritis    "right knee" (09/04/2015)   Atrial flutter with rapid ventricular response ([facility]) 04/19/2019   Dizziness 10/2019   Dyspnea    increased exertion   Elevated serum creatinine 07/2020   HTN (hypertension)    Hyperlipidemia    Lung cancer ([facility]) 05/2019   Microalbuminuria 07/2020   NSTEMI (non-ST elevated myocardial infarction) ([facility])  09/04/2015   a. cath 09/05/2015: 95% stenosis mid-LAD, 55% mid-RCA, and 40% prox-RCA. PCI performed w/ Synergy DES to LAD   PAD (peripheral artery disease) ([facility])    Stenting of bilateral iliacs in 2003.   Refusal of blood transfusions as patient is Jehovah's Witness    Type II diabetes mellitus ([facility])    Vitamin D deficiency 07/2020    ALLERGIES:  has No Known Allergies.  MEDICATIONS:  Current Outpatient Medications  Medication Sig Dispense Refill   acetaminophen (TYLENOL) 500 MG tablet Take 500 mg by mouth every 6 (six) hours as needed for moderate pain or fever.      aspirin EC 81 MG tablet Take 81 mg by mouth at bedtime.     benzonatate (TESSALON PERLES) 100 MG capsule Take 1 capsule (100 mg total) by mouth 3 (three) times daily as needed. (Patient not taking: Reported on 07/23/2021) 20 capsule 0   blood glucose meter kit and supplies Dispense based on patient and insurance preference. Use up to four times daily as directed. (FOR ICD-10 E10.9, E11.9). 1 each 0   carvedilol (COREG) 12.5 MG tablet Take 1 tablet (12.5 mg total) by mouth 2 (two) times daily with a meal. 180 tablet 3   clopidogrel (PLAVIX) 75 MG tablet Take 1 tablet (75 mg total) by mouth daily. 90 tablet 3   docusate sodium (COLACE) 100 MG capsule Take 1 capsule (100 mg total) by mouth 2 (two) times daily as needed for mild constipation. 180 capsule 3   ferrous sulfate 324 MG TBEC Take 1 tablet (324 mg total) by  mouth daily with breakfast. 30 tablet 11   gabapentin (NEURONTIN) 300 MG capsule Take 1 capsule (300 mg total) by mouth 3 (three) times daily. 270 capsule 3   glimepiride (AMARYL) 4 MG tablet Take 1 tablet (4 mg total) by mouth daily. 90 tablet 3   glucose blood (ACCU-CHEK GUIDE) test strip Use as instructed up to four times daily as directed. 100 each 12   HYDROcodone bit-homatropine (HYCODAN) 5-1.5 MG/5ML syrup Take 5 mLs by mouth every 6 (six) hours as needed for cough. 120 mL 0   lidocaine-prilocaine (EMLA) cream  APPLY TO THE PORT-A-CATH SITE 30-60 MINUTES BEFORE TREATMENT. (Patient taking differently: Apply 1 application. topically daily as needed (access port). Apply to the Port-A-Cath site 30-60 minutes before treatment.) 30 g 0   metFORMIN (GLUCOPHAGE) 1000 MG tablet Take 1,000 mg by mouth 2 (two) times daily.     multivitamin (ONE-A-DAY MEN'S) TABS tablet Take 1 tablet by mouth daily. 30 tablet 6   nitroGLYCERIN (NITROSTAT) 0.4 MG SL tablet Place 1 tablet (0.4 mg total) under the tongue every 5 (five) minutes x 3 doses as needed for chest pain. 25 tablet 2   rosuvastatin (CRESTOR) 40 MG tablet Take 1 tablet (40 mg total) by mouth daily. 90 tablet 3   No current facility-administered medications for this visit.    SURGICAL HISTORY:  Past Surgical History:  Procedure Laterality Date   CARDIAC CATHETERIZATION N/A 09/05/2015   Procedure: Left Heart Cath and Coronary Angiography;  Surgeon: Troy Sine, MD;  Location: [facility];  Service: Cardiovascular;  Laterality: N/A;   CARDIAC CATHETERIZATION N/A 09/05/2015   Procedure: Coronary Stent Intervention;  Surgeon: Troy Sine, MD;  Location: [facility];  Service: Cardiovascular;  Laterality: N/A;   IR IMAGING GUIDED PORT INSERTION  07/15/2019   ORIF CONGENITAL HIP DISLOCATION Left ~ 1965   "put pins in it"   PERIPHERAL VASCULAR CATHETERIZATION Bilateral 2003   "stenting"   TONSILLECTOMY  ~ [place]:  Constitutional: positive for fatigue Eyes: negative Ears, nose, mouth, throat, and face: negative Respiratory: negative Cardiovascular: negative Gastrointestinal: negative Genitourinary:negative Integument/breast: negative Hematologic/lymphatic: negative Musculoskeletal:negative Neurological: negative Behavioral/Psych: negative Endocrine: negative Allergic/Immunologic: negative   PHYSICAL EXAMINATION: General appearance: alert, cooperative, appears stated age, fatigued, and no distress Head:  Normocephalic, without obvious abnormality, atraumatic Neck: no adenopathy, no JVD, supple, symmetrical, trachea midline, and thyroid not enlarged, symmetric, no tenderness/mass/nodules Lymph nodes: Cervical, supraclavicular, and axillary nodes normal. Resp: clear to auscultation bilaterally Back: symmetric, no curvature. ROM normal. No CVA tenderness. Cardio: regular rate and rhythm, S1, S2 normal, no murmur, click, rub or gallop GI: soft, non-tender; bowel sounds normal; no masses,  no organomegaly Extremities: extremities normal, atraumatic, no cyanosis or edema Neurologic: Alert and oriented X 3, normal strength and tone. Normal symmetric reflexes. Normal coordination and gait  ECOG PERFORMANCE STATUS: 1 - Symptomatic but completely ambulatory  Blood pressure (!) 152/84, pulse 79, temperature (!) 97.2 F (36.2 C), temperature source Tympanic, resp. rate 16, weight 223 lb 8 oz (101.4 kg), SpO2 100 %.  LABORATORY DATA: Lab Results  Component Value Date   WBC 8.0 10/15/2021   HGB 9.5 (L) 10/15/2021   HCT 30.3 (L) 10/15/2021   MCV 83.7 10/15/2021   PLT 271 10/15/2021      Chemistry      Component Value Date/Time   NA 141 10/15/2021 1510   NA 140 06/08/2015 0840   K 3.9 10/15/2021 1510  CL 106 10/15/2021 1510   CO2 27 10/15/2021 1510   BUN 33 (H) 10/15/2021 1510   BUN 15 06/08/2015 0840   CREATININE 2.52 (H) 10/15/2021 1510   CREATININE 1.06 10/01/2016 0855      Component Value Date/Time   CALCIUM 10.2 10/15/2021 1510   ALKPHOS 52 10/15/2021 1510   AST 15 10/15/2021 1510   ALT 14 10/15/2021 1510   BILITOT 0.4 10/15/2021 1510       RADIOGRAPHIC STUDIES: No results found.  ASSESSMENT AND PLAN: This is a very pleasant 66 years old African-American male recently diagnosed with a stage IV (TX, N3, M1c) non-small cell lung cancer, poorly differentiated adenocarcinoma presented with bulky mediastinal as well as right hilar, supraclavicular, abdominal and right axillary  lymphadenopathy in addition to metastatic liver lesions diagnosed in January 2021. Molecular studies by [employer] shows no actionable mutations.  The patient is not a candidate for treatment with targeted therapy or enrollment in the clinical trial with the [facility] regimen. The patient is currently undergoing systemic chemotherapy with carboplatin for AUC of 5, Alimta 500 mg/M2 and Keytruda 200 mg IV every 3 weeks status post 35 cycles. Starting from cycle #5 he is treated with maintenance Alimta and Keytruda every 3 weeks.  Starting from cycle #12 the patient has been on treatment with single agent Keytruda because of the renal insufficiency. The patient completed 2 years of treatment with immunotherapy. The patient is currently on observation and he is feeling fine with no concerning complaints. He had repeat CT scan of the chest, abdomen pelvis performed recently.  I personally and independently reviewed the scan and discussed the results with the patient today. His scan showed no concerning findings for disease progression. I recommended for the patient to continue on observation with repeat CT scan of the chest, abdomen and pelvis in 3 months. I will arrange for the patient to have Port-A-Cath flush every 6 weeks. For the chronic anemia, he will continue on the oral iron tablets for now. For the renal insufficiency, he is followed by nephrology. The patient was advised to call immediately if he has any concerning symptoms in the interval. The patient voices understanding of current disease status and treatment options and is in agreement with the current care plan.  All questions were answered. The patient knows to call the clinic with any problems, questions or concerns. We can certainly see the patient much sooner if necessary.  Disclaimer: This note was dictated with voice recognition software. Similar sounding words can inadvertently be transcribed and may not be corrected upon  review.

## 2021-11-22 ENCOUNTER — Inpatient Hospital Stay: Payer: Medicare Other | Attending: Internal Medicine

## 2021-12-02 ENCOUNTER — Other Ambulatory Visit: Payer: Self-pay | Admitting: Nurse Practitioner

## 2021-12-02 DIAGNOSIS — K5900 Constipation, unspecified: Secondary | ICD-10-CM

## 2021-12-05 ENCOUNTER — Other Ambulatory Visit: Payer: Self-pay

## 2021-12-05 ENCOUNTER — Inpatient Hospital Stay: Payer: Medicare Other | Attending: Internal Medicine

## 2021-12-05 DIAGNOSIS — C3491 Malignant neoplasm of unspecified part of right bronchus or lung: Secondary | ICD-10-CM | POA: Insufficient documentation

## 2021-12-05 DIAGNOSIS — Z95828 Presence of other vascular implants and grafts: Secondary | ICD-10-CM

## 2021-12-05 LAB — CBC WITH DIFFERENTIAL (CANCER CENTER ONLY)
Abs Immature Granulocytes: 0.01 10*3/uL (ref 0.00–0.07)
Basophils Absolute: 0 10*3/uL (ref 0.0–0.1)
Basophils Relative: 1 %
Eosinophils Absolute: 0.2 10*3/uL (ref 0.0–0.5)
Eosinophils Relative: 3 %
HCT: 31.4 % — ABNORMAL LOW (ref 39.0–52.0)
Hemoglobin: 9.8 g/dL — ABNORMAL LOW (ref 13.0–17.0)
Immature Granulocytes: 0 %
Lymphocytes Relative: 40 %
Lymphs Abs: 2.4 10*3/uL (ref 0.7–4.0)
MCH: 25.9 pg — ABNORMAL LOW (ref 26.0–34.0)
MCHC: 31.2 g/dL (ref 30.0–36.0)
MCV: 82.8 fL (ref 80.0–100.0)
Monocytes Absolute: 0.6 10*3/uL (ref 0.1–1.0)
Monocytes Relative: 10 %
Neutro Abs: 2.8 10*3/uL (ref 1.7–7.7)
Neutrophils Relative %: 46 %
Platelet Count: 257 10*3/uL (ref 150–400)
RBC: 3.79 MIL/uL — ABNORMAL LOW (ref 4.22–5.81)
RDW: 15.6 % — ABNORMAL HIGH (ref 11.5–15.5)
WBC Count: 6 10*3/uL (ref 4.0–10.5)
nRBC: 0 % (ref 0.0–0.2)

## 2021-12-05 LAB — CMP (CANCER CENTER ONLY)
ALT: 13 U/L (ref 0–44)
AST: 15 U/L (ref 15–41)
Albumin: 4.3 g/dL (ref 3.5–5.0)
Alkaline Phosphatase: 58 U/L (ref 38–126)
Anion gap: 7 (ref 5–15)
BUN: 25 mg/dL — ABNORMAL HIGH (ref 8–23)
CO2: 26 mmol/L (ref 22–32)
Calcium: 9.7 mg/dL (ref 8.9–10.3)
Chloride: 107 mmol/L (ref 98–111)
Creatinine: 2 mg/dL — ABNORMAL HIGH (ref 0.61–1.24)
GFR, Estimated: 36 mL/min — ABNORMAL LOW (ref >60)
Glucose, Bld: 142 mg/dL — ABNORMAL HIGH (ref 70–99)
Potassium: 3.9 mmol/L (ref 3.5–5.1)
Sodium: 140 mmol/L (ref 135–145)
Total Bilirubin: 0.4 mg/dL (ref 0.3–1.2)
Total Protein: 7.9 g/dL (ref 6.5–8.1)

## 2021-12-05 MED ORDER — SODIUM CHLORIDE 0.9% FLUSH
10.0000 mL | Freq: Once | INTRAVENOUS | Status: AC
  Administered 2021-12-05: 10 mL

## 2021-12-17 ENCOUNTER — Other Ambulatory Visit: Payer: Self-pay

## 2022-01-03 ENCOUNTER — Inpatient Hospital Stay: Payer: Medicare Other | Attending: Internal Medicine

## 2022-01-03 ENCOUNTER — Other Ambulatory Visit: Payer: Self-pay

## 2022-01-03 DIAGNOSIS — D638 Anemia in other chronic diseases classified elsewhere: Secondary | ICD-10-CM | POA: Diagnosis not present

## 2022-01-03 DIAGNOSIS — C787 Secondary malignant neoplasm of liver and intrahepatic bile duct: Secondary | ICD-10-CM | POA: Insufficient documentation

## 2022-01-03 DIAGNOSIS — C3491 Malignant neoplasm of unspecified part of right bronchus or lung: Secondary | ICD-10-CM | POA: Insufficient documentation

## 2022-01-03 DIAGNOSIS — N289 Disorder of kidney and ureter, unspecified: Secondary | ICD-10-CM | POA: Insufficient documentation

## 2022-01-03 DIAGNOSIS — Z95828 Presence of other vascular implants and grafts: Secondary | ICD-10-CM

## 2022-01-03 DIAGNOSIS — I10 Essential (primary) hypertension: Secondary | ICD-10-CM | POA: Diagnosis not present

## 2022-01-03 DIAGNOSIS — E119 Type 2 diabetes mellitus without complications: Secondary | ICD-10-CM | POA: Diagnosis not present

## 2022-01-03 LAB — CBC WITH DIFFERENTIAL (CANCER CENTER ONLY)
Abs Immature Granulocytes: 0.01 10*3/uL (ref 0.00–0.07)
Basophils Absolute: 0.1 10*3/uL (ref 0.0–0.1)
Basophils Relative: 1 %
Eosinophils Absolute: 0.2 10*3/uL (ref 0.0–0.5)
Eosinophils Relative: 3 %
HCT: 31.2 % — ABNORMAL LOW (ref 39.0–52.0)
Hemoglobin: 9.9 g/dL — ABNORMAL LOW (ref 13.0–17.0)
Immature Granulocytes: 0 %
Lymphocytes Relative: 40 %
Lymphs Abs: 2.4 10*3/uL (ref 0.7–4.0)
MCH: 26.2 pg (ref 26.0–34.0)
MCHC: 31.7 g/dL (ref 30.0–36.0)
MCV: 82.5 fL (ref 80.0–100.0)
Monocytes Absolute: 0.6 10*3/uL (ref 0.1–1.0)
Monocytes Relative: 10 %
Neutro Abs: 2.8 10*3/uL (ref 1.7–7.7)
Neutrophils Relative %: 46 %
Platelet Count: 266 10*3/uL (ref 150–400)
RBC: 3.78 MIL/uL — ABNORMAL LOW (ref 4.22–5.81)
RDW: 17.2 % — ABNORMAL HIGH (ref 11.5–15.5)
WBC Count: 6.1 10*3/uL (ref 4.0–10.5)
nRBC: 0 % (ref 0.0–0.2)

## 2022-01-03 LAB — CMP (CANCER CENTER ONLY)
ALT: 17 U/L (ref 0–44)
AST: 18 U/L (ref 15–41)
Albumin: 4.5 g/dL (ref 3.5–5.0)
Alkaline Phosphatase: 61 U/L (ref 38–126)
Anion gap: 5 (ref 5–15)
BUN: 29 mg/dL — ABNORMAL HIGH (ref 8–23)
CO2: 25 mmol/L (ref 22–32)
Calcium: 9.1 mg/dL (ref 8.9–10.3)
Chloride: 109 mmol/L (ref 98–111)
Creatinine: 2.19 mg/dL — ABNORMAL HIGH (ref 0.61–1.24)
GFR, Estimated: 32 mL/min — ABNORMAL LOW (ref >60)
Glucose, Bld: 143 mg/dL — ABNORMAL HIGH (ref 70–99)
Potassium: 3.7 mmol/L (ref 3.5–5.1)
Sodium: 139 mmol/L (ref 135–145)
Total Bilirubin: 0.3 mg/dL (ref 0.3–1.2)
Total Protein: 8.4 g/dL — ABNORMAL HIGH (ref 6.5–8.1)

## 2022-01-03 MED ORDER — SODIUM CHLORIDE 0.9% FLUSH
10.0000 mL | INTRAVENOUS | Status: AC | PRN
  Administered 2022-01-03: 10 mL

## 2022-01-03 MED ORDER — HEPARIN SOD (PORK) LOCK FLUSH 100 UNIT/ML IV SOLN
500.0000 [IU] | INTRAVENOUS | Status: AC | PRN
  Administered 2022-01-03: 500 [IU]

## 2022-01-14 ENCOUNTER — Ambulatory Visit (HOSPITAL_COMMUNITY): Payer: Medicare Other

## 2022-01-14 ENCOUNTER — Inpatient Hospital Stay: Payer: Medicare Other

## 2022-01-14 ENCOUNTER — Other Ambulatory Visit: Payer: Self-pay

## 2022-01-14 DIAGNOSIS — Z95828 Presence of other vascular implants and grafts: Secondary | ICD-10-CM

## 2022-01-14 DIAGNOSIS — C3490 Malignant neoplasm of unspecified part of unspecified bronchus or lung: Secondary | ICD-10-CM

## 2022-01-14 DIAGNOSIS — C3491 Malignant neoplasm of unspecified part of right bronchus or lung: Secondary | ICD-10-CM | POA: Diagnosis not present

## 2022-01-14 LAB — CMP (CANCER CENTER ONLY)
ALT: 15 U/L (ref 0–44)
AST: 18 U/L (ref 15–41)
Albumin: 4.3 g/dL (ref 3.5–5.0)
Alkaline Phosphatase: 65 U/L (ref 38–126)
Anion gap: 6 (ref 5–15)
BUN: 32 mg/dL — ABNORMAL HIGH (ref 8–23)
CO2: 26 mmol/L (ref 22–32)
Calcium: 9.9 mg/dL (ref 8.9–10.3)
Chloride: 108 mmol/L (ref 98–111)
Creatinine: 2.92 mg/dL — ABNORMAL HIGH (ref 0.61–1.24)
GFR, Estimated: 23 mL/min — ABNORMAL LOW (ref >60)
Glucose, Bld: 222 mg/dL — ABNORMAL HIGH (ref 70–99)
Potassium: 3.8 mmol/L (ref 3.5–5.1)
Sodium: 140 mmol/L (ref 135–145)
Total Bilirubin: 0.3 mg/dL (ref 0.3–1.2)
Total Protein: 7.6 g/dL (ref 6.5–8.1)

## 2022-01-14 LAB — CBC WITH DIFFERENTIAL (CANCER CENTER ONLY)
Abs Immature Granulocytes: 0.01 10*3/uL (ref 0.00–0.07)
Basophils Absolute: 0.1 10*3/uL (ref 0.0–0.1)
Basophils Relative: 1 %
Eosinophils Absolute: 0.2 10*3/uL (ref 0.0–0.5)
Eosinophils Relative: 3 %
HCT: 30 % — ABNORMAL LOW (ref 39.0–52.0)
Hemoglobin: 9.6 g/dL — ABNORMAL LOW (ref 13.0–17.0)
Immature Granulocytes: 0 %
Lymphocytes Relative: 35 %
Lymphs Abs: 2.1 10*3/uL (ref 0.7–4.0)
MCH: 26.8 pg (ref 26.0–34.0)
MCHC: 32 g/dL (ref 30.0–36.0)
MCV: 83.8 fL (ref 80.0–100.0)
Monocytes Absolute: 0.6 10*3/uL (ref 0.1–1.0)
Monocytes Relative: 10 %
Neutro Abs: 3.1 10*3/uL (ref 1.7–7.7)
Neutrophils Relative %: 51 %
Platelet Count: 229 10*3/uL (ref 150–400)
RBC: 3.58 MIL/uL — ABNORMAL LOW (ref 4.22–5.81)
RDW: 17.3 % — ABNORMAL HIGH (ref 11.5–15.5)
WBC Count: 6 10*3/uL (ref 4.0–10.5)
nRBC: 0 % (ref 0.0–0.2)

## 2022-01-14 MED ORDER — SODIUM CHLORIDE 0.9% FLUSH
10.0000 mL | Freq: Once | INTRAVENOUS | Status: AC
  Administered 2022-01-14: 10 mL

## 2022-01-15 ENCOUNTER — Other Ambulatory Visit (HOSPITAL_COMMUNITY): Payer: Medicare Other

## 2022-01-15 ENCOUNTER — Ambulatory Visit (HOSPITAL_COMMUNITY): Admission: RE | Admit: 2022-01-15 | Payer: Medicare Other | Source: Ambulatory Visit

## 2022-01-15 ENCOUNTER — Ambulatory Visit (HOSPITAL_COMMUNITY)
Admission: RE | Admit: 2022-01-15 | Discharge: 2022-01-15 | Disposition: A | Discharge status: Home / Self Care | Payer: Medicare Other | Source: Ambulatory Visit | Attending: Internal Medicine | Admitting: Internal Medicine

## 2022-01-15 DIAGNOSIS — C3490 Malignant neoplasm of unspecified part of unspecified bronchus or lung: Secondary | ICD-10-CM | POA: Diagnosis not present

## 2022-01-16 ENCOUNTER — Other Ambulatory Visit: Payer: Self-pay

## 2022-01-16 ENCOUNTER — Encounter: Payer: Self-pay | Admitting: Internal Medicine

## 2022-01-16 ENCOUNTER — Inpatient Hospital Stay (HOSPITAL_BASED_OUTPATIENT_CLINIC_OR_DEPARTMENT_OTHER): Payer: Medicare Other | Admitting: Internal Medicine

## 2022-01-16 VITALS — BP 149/52 | HR 76 | Temp 98.0°F | Resp 18 | Ht 75.0 in | Wt 222.3 lb

## 2022-01-16 DIAGNOSIS — C3491 Malignant neoplasm of unspecified part of right bronchus or lung: Secondary | ICD-10-CM | POA: Diagnosis not present

## 2022-01-16 DIAGNOSIS — C3490 Malignant neoplasm of unspecified part of unspecified bronchus or lung: Secondary | ICD-10-CM

## 2022-01-16 NOTE — Addendum Note (Signed)
Addended by: Ardeen Garland on: 01/16/2022 03:15 PM   Modules accepted: Orders

## 2022-01-16 NOTE — [place] Notes (Signed)
[place] [phone]   [phone]) [phone]  OFFICE PROGRESS NOTE  Fenton Foy, NP [place] [address] [name] [address]  DIAGNOSIS: Stage IV (TX, N3, M1c) non-small cell lung cancer, poorly differentiated adenocarcinoma presented with bulky mediastinal lymphadenopathy in addition to right axillary lymphadenopathy and metastatic liver lesions, abdominal lymphadenopathy diagnosed in January 2021. Molecular studies by [employer] 360 that shows no actionable mutations.  PRIOR THERAPY: Systemic chemotherapy with carboplatin for AUC of 5, Alimta 500 mg/M2 and Keytruda 200 mg IV every 3 weeks.  First dose July 14, 2019.  Status post 35  cycles. Starting from cycle #5 the patient will be treated with maintenance Alimta and Keytruda every 3 weeks.  Starting from cycle #12 he will be on single agent treatment with Keytruda secondary to renal insufficiency.  CURRENT THERAPY: Observation  INTERVAL HISTORY: Kirk [name] 66 y.o. male returns to the clinic today for follow-up visit.  The patient has no complaints today except for the mild fatigue.  He is followed by nephrology for his renal insufficiency and his serum creatinine has been higher recently.  He denied having any chest pain, shortness of breath, cough or hemoptysis.  He has no nausea, vomiting, diarrhea or constipation.  He has no headache or visual changes.  He has no recent weight loss or night sweats.  He had repeat CT scan of the chest, abdomen and pelvis performed recently and is here for evaluation and discussion of his scan results.  MEDICAL HISTORY: Past Medical History:  Diagnosis Date   Arthritis    "right knee" (09/04/2015)   Atrial flutter with rapid ventricular response ([facility]) 04/19/2019   Dizziness 10/2019   Dyspnea    increased exertion   Elevated serum creatinine 07/2020   HTN (hypertension)    Hyperlipidemia    Lung cancer ([facility]) 05/2019   Microalbuminuria 07/2020    NSTEMI (non-ST elevated myocardial infarction) ([facility]) 09/04/2015   a. cath 09/05/2015: 95% stenosis mid-LAD, 55% mid-RCA, and 40% prox-RCA. PCI performed w/ Synergy DES to LAD   PAD (peripheral artery disease) ([facility])    Stenting of bilateral iliacs in 2003.   Refusal of blood transfusions as patient is Jehovah's Witness    Type II diabetes mellitus ([facility])    Vitamin D deficiency 07/2020    ALLERGIES:  has No Known Allergies.  MEDICATIONS:  Current Outpatient Medications  Medication Sig Dispense Refill   acetaminophen (TYLENOL) 500 MG tablet Take 500 mg by mouth every 6 (six) hours as needed for moderate pain or fever.      aspirin EC 81 MG tablet Take 81 mg by mouth at bedtime.     benzonatate (TESSALON PERLES) 100 MG capsule Take 1 capsule (100 mg total) by mouth 3 (three) times daily as needed. (Patient not taking: Reported on 07/23/2021) 20 capsule 0   blood glucose meter kit and supplies Dispense based on patient and insurance preference. Use up to four times daily as directed. (FOR ICD-10 E10.9, E11.9). 1 each 0   carvedilol (COREG) 12.5 MG tablet Take 1 tablet (12.5 mg total) by mouth 2 (two) times daily with a meal. 180 tablet 3   clopidogrel (PLAVIX) 75 MG tablet TAKE 1 TABLET BY MOUTH EVERY DAY 90 tablet 3   docusate sodium (COLACE) 100 MG capsule TAKE 1 CAPSULE (100 MG TOTAL) BY MOUTH 2 (TWO) TIMES DAILY AS NEEDED FOR MILD CONSTIPATION. 180 capsule 3   ferrous sulfate 324 MG TBEC Take  1 tablet (324 mg total) by mouth daily with breakfast. 30 tablet 11   gabapentin (NEURONTIN) 300 MG capsule Take 1 capsule (300 mg total) by mouth 3 (three) times daily. 270 capsule 3   glimepiride (AMARYL) 4 MG tablet Take 1 tablet (4 mg total) by mouth daily. 90 tablet 3   glucose blood (ACCU-CHEK GUIDE) test strip Use as instructed up to four times daily as directed. 100 each 12   HYDROcodone bit-homatropine (HYCODAN) 5-1.5 MG/5ML syrup Take 5 mLs by mouth every 6 (six) hours as needed for cough. 120  mL 0   lidocaine-prilocaine (EMLA) cream APPLY TO THE PORT-A-CATH SITE 30-60 MINUTES BEFORE TREATMENT. (Patient taking differently: Apply 1 application. topically daily as needed (access port). Apply to the Port-A-Cath site 30-60 minutes before treatment.) 30 g 0   metFORMIN (GLUCOPHAGE) 1000 MG tablet Take 1,000 mg by mouth 2 (two) times daily.     multivitamin (ONE-A-DAY MEN'S) TABS tablet Take 1 tablet by mouth daily. 30 tablet 6   nitroGLYCERIN (NITROSTAT) 0.4 MG SL tablet Place 1 tablet (0.4 mg total) under the tongue every 5 (five) minutes x 3 doses as needed for chest pain. 25 tablet 2   rosuvastatin (CRESTOR) 40 MG tablet TAKE 1 TABLET BY MOUTH EVERY DAY 90 tablet 3   No current facility-administered medications for this visit.    SURGICAL HISTORY:  Past Surgical History:  Procedure Laterality Date   CARDIAC CATHETERIZATION N/A 09/05/2015   Procedure: Left Heart Cath and Coronary Angiography;  Surgeon: Troy Sine, MD;  Location: [facility];  Service: Cardiovascular;  Laterality: N/A;   CARDIAC CATHETERIZATION N/A 09/05/2015   Procedure: Coronary Stent Intervention;  Surgeon: Troy Sine, MD;  Location: [facility];  Service: Cardiovascular;  Laterality: N/A;   IR IMAGING GUIDED PORT INSERTION  07/15/2019   ORIF CONGENITAL HIP DISLOCATION Left ~ 1965   "put pins in it"   PERIPHERAL VASCULAR CATHETERIZATION Bilateral 2003   "stenting"   TONSILLECTOMY  ~ [place]:  Constitutional: positive for fatigue Eyes: negative Ears, nose, mouth, throat, and face: negative Respiratory: negative Cardiovascular: negative Gastrointestinal: negative Genitourinary:negative Integument/breast: negative Hematologic/lymphatic: negative Musculoskeletal:negative Neurological: negative Behavioral/Psych: negative Endocrine: negative Allergic/Immunologic: negative   PHYSICAL EXAMINATION: General appearance: alert, cooperative, appears stated age, fatigued, and  no distress Head: Normocephalic, without obvious abnormality, atraumatic Neck: no adenopathy, no JVD, supple, symmetrical, trachea midline, and thyroid not enlarged, symmetric, no tenderness/mass/nodules Lymph nodes: Cervical, supraclavicular, and axillary nodes normal. Resp: clear to auscultation bilaterally Back: symmetric, no curvature. ROM normal. No CVA tenderness. Cardio: regular rate and rhythm, S1, S2 normal, no murmur, click, rub or gallop GI: soft, non-tender; bowel sounds normal; no masses,  no organomegaly Extremities: extremities normal, atraumatic, no cyanosis or edema Neurologic: Alert and oriented X 3, normal strength and tone. Normal symmetric reflexes. Normal coordination and gait  ECOG PERFORMANCE STATUS: 1 - Symptomatic but completely ambulatory  Blood pressure (!) 149/52, pulse 76, temperature 98 F (36.7 C), temperature source Oral, resp. rate 18, height 6' 3"  (1.905 m), weight 222 lb 5 oz (100.8 kg), SpO2 100 %.  LABORATORY DATA: Lab Results  Component Value Date   WBC 6.0 01/14/2022   HGB 9.6 (L) 01/14/2022   HCT 30.0 (L) 01/14/2022   MCV 83.8 01/14/2022   PLT 229 01/14/2022      Chemistry      Component Value Date/Time   NA 140 01/14/2022 1112   NA 140 06/08/2015  0840   K 3.8 01/14/2022 1112   CL 108 01/14/2022 1112   CO2 26 01/14/2022 1112   BUN 32 (H) 01/14/2022 1112   BUN 15 06/08/2015 0840   CREATININE 2.92 (H) 01/14/2022 1112   CREATININE 1.06 10/01/2016 0855      Component Value Date/Time   CALCIUM 9.9 01/14/2022 1112   ALKPHOS 65 01/14/2022 1112   AST 18 01/14/2022 1112   ALT 15 01/14/2022 1112   BILITOT 0.3 01/14/2022 1112       RADIOGRAPHIC STUDIES: CT Chest Wo Contrast  Result Date: 01/16/2022 CLINICAL DATA:  Non-small cell lung cancer restaging. * Tracking Code: BO * EXAM: CT CHEST, ABDOMEN AND PELVIS WITHOUT CONTRAST TECHNIQUE: Multidetector CT imaging of the chest, abdomen and pelvis was performed following the standard  protocol without IV contrast. RADIATION DOSE REDUCTION: This exam was performed according to the departmental dose-optimization program which includes automated exposure control, adjustment of the mA and/or kV according to patient size and/or use of iterative reconstruction technique. COMPARISON:  Prior CTs 10/15/2021 and 07/16/2021. FINDINGS: CT CHEST FINDINGS Cardiovascular: Atherosclerosis of the aorta, great vessels and coronary arteries. Right IJ Port-A-Cath extends to the superior cavoatrial junction. The heart size is normal. There is no pericardial effusion. Mediastinum/Nodes: There are no enlarged mediastinal, hilar or axillary lymph nodes. Stable small mediastinal lymph nodes, including an 8 mm distal paraesophageal node on image 45/504. The thyroid gland, trachea and esophagus demonstrate no significant findings. Lungs/Pleura: No pleural effusion or pneumothorax. Mild centrilobular emphysema with stable mild scarring in the right middle lobe. No suspicious pulmonary nodule. Musculoskeletal/Chest wall: No chest wall mass or suspicious osseous findings. CT ABDOMEN AND PELVIS FINDINGS Hepatobiliary: Low-density lesion anteriorly in the right hepatic lobe (segment 5) appears unchanged, measuring 2.4 x 1.6 cm on image 67/504 (previously 2.5 x 1.7 cm). No new or enlarging liver lesions identified on noncontrast imaging. No evidence of gallstones, gallbladder wall thickening or biliary dilatation. Pancreas: Unremarkable. No pancreatic ductal dilatation or surrounding inflammatory changes. Spleen: Normal in size without focal abnormality. Adrenals/Urinary Tract: Both adrenal glands appear normal. The kidneys appear normal without evidence of urinary tract calculus, suspicious lesion or hydronephrosis. The bladder appears normal for its degree of distention. Stomach/Bowel: Enteric contrast was administered and has passed into the proximal colon. The stomach appears unremarkable for its degree of distension. No  evidence of bowel wall thickening, distention or surrounding inflammatory change. The appendix appears normal. Vascular/Lymphatic: There are no enlarged abdominal or pelvic lymph nodes. Stable small lymph nodes in the gastrohepatic ligament and porta hepatis. Diffuse aortic and branch vessel atherosclerosis status post iliac stenting bilaterally. Reproductive: The prostate gland and seminal vesicles appear unremarkable. Other: No evidence of abdominal wall mass or hernia. No ascites. Musculoskeletal: No acute or significant osseous findings. Previous left hip pinning. Asymmetric right hip osteoarthritis with mild lumbar spondylosis. IMPRESSION: 1. Stable CTs of the chest, abdomen and pelvis. No evidence of local recurrence or metastatic disease. 2. Stable hypodense lesion in segment 5 of the liver, characterized as a hemangioma on contrast enhanced abdominal CT 01/17/2020. No suspicious liver lesions. 3. Stable scattered small lymph nodes as described. 4. Coronary and aortic atherosclerosis (ICD10-I70.0). Emphysema (ICD10-J43.9). Electronically Signed   By: Richardean Sale M.D.   On: 01/16/2022 12:19   CT Abdomen Pelvis Wo Contrast  Result Date: 01/16/2022 CLINICAL DATA:  Non-small cell lung cancer restaging. * Tracking Code: BO * EXAM: CT CHEST, ABDOMEN AND PELVIS WITHOUT CONTRAST TECHNIQUE: Multidetector CT imaging of the chest, abdomen  and pelvis was performed following the standard protocol without IV contrast. RADIATION DOSE REDUCTION: This exam was performed according to the departmental dose-optimization program which includes automated exposure control, adjustment of the mA and/or kV according to patient size and/or use of iterative reconstruction technique. COMPARISON:  Prior CTs 10/15/2021 and 07/16/2021. FINDINGS: CT CHEST FINDINGS Cardiovascular: Atherosclerosis of the aorta, great vessels and coronary arteries. Right IJ Port-A-Cath extends to the superior cavoatrial junction. The heart size is  normal. There is no pericardial effusion. Mediastinum/Nodes: There are no enlarged mediastinal, hilar or axillary lymph nodes. Stable small mediastinal lymph nodes, including an 8 mm distal paraesophageal node on image 45/504. The thyroid gland, trachea and esophagus demonstrate no significant findings. Lungs/Pleura: No pleural effusion or pneumothorax. Mild centrilobular emphysema with stable mild scarring in the right middle lobe. No suspicious pulmonary nodule. Musculoskeletal/Chest wall: No chest wall mass or suspicious osseous findings. CT ABDOMEN AND PELVIS FINDINGS Hepatobiliary: Low-density lesion anteriorly in the right hepatic lobe (segment 5) appears unchanged, measuring 2.4 x 1.6 cm on image 67/504 (previously 2.5 x 1.7 cm). No new or enlarging liver lesions identified on noncontrast imaging. No evidence of gallstones, gallbladder wall thickening or biliary dilatation. Pancreas: Unremarkable. No pancreatic ductal dilatation or surrounding inflammatory changes. Spleen: Normal in size without focal abnormality. Adrenals/Urinary Tract: Both adrenal glands appear normal. The kidneys appear normal without evidence of urinary tract calculus, suspicious lesion or hydronephrosis. The bladder appears normal for its degree of distention. Stomach/Bowel: Enteric contrast was administered and has passed into the proximal colon. The stomach appears unremarkable for its degree of distension. No evidence of bowel wall thickening, distention or surrounding inflammatory change. The appendix appears normal. Vascular/Lymphatic: There are no enlarged abdominal or pelvic lymph nodes. Stable small lymph nodes in the gastrohepatic ligament and porta hepatis. Diffuse aortic and branch vessel atherosclerosis status post iliac stenting bilaterally. Reproductive: The prostate gland and seminal vesicles appear unremarkable. Other: No evidence of abdominal wall mass or hernia. No ascites. Musculoskeletal: No acute or significant  osseous findings. Previous left hip pinning. Asymmetric right hip osteoarthritis with mild lumbar spondylosis. IMPRESSION: 1. Stable CTs of the chest, abdomen and pelvis. No evidence of local recurrence or metastatic disease. 2. Stable hypodense lesion in segment 5 of the liver, characterized as a hemangioma on contrast enhanced abdominal CT 01/17/2020. No suspicious liver lesions. 3. Stable scattered small lymph nodes as described. 4. Coronary and aortic atherosclerosis (ICD10-I70.0). Emphysema (ICD10-J43.9). Electronically Signed   By: Richardean Sale M.D.   On: 01/16/2022 12:19    ASSESSMENT AND PLAN: This is a very pleasant 66 years old African-American male recently diagnosed with a stage IV (TX, N3, M1c) non-small cell lung cancer, poorly differentiated adenocarcinoma presented with bulky mediastinal as well as right hilar, supraclavicular, abdominal and right axillary lymphadenopathy in addition to metastatic liver lesions diagnosed in January 2021. Molecular studies by guardant 360 shows no actionable mutations.  The patient is not a candidate for treatment with targeted therapy or enrollment in the clinical trial with the [facility] regimen. The patient is currently undergoing systemic chemotherapy with carboplatin for AUC of 5, Alimta 500 mg/M2 and Keytruda 200 mg IV every 3 weeks status post 35 cycles. Starting from cycle #5 he is treated with maintenance Alimta and Keytruda every 3 weeks.  Starting from cycle #12 the patient has been on treatment with single agent Keytruda because of the renal insufficiency. The patient completed 2 years of treatment with immunotherapy. He is currently on observation and feeling fine  with no concerning complaints except for mild fatigue secondary to anemia. The patient had repeat CT scan of the chest, abdomen and pelvis performed recently.  I personally and independently reviewed the scan images and discussed the results with the patient today. His scan showed no  concerning findings for disease progression. I recommended for him to continue on observation with repeat CT scan of the chest, abdomen and pelvis in 3 months. For the anemia of chronic disease, he will continue with the oral iron tablets. For the renal insufficiency he is followed by nephrology. For the Port-A-Cath maintenance, I will arrange for the patient to have flush every 6 weeks. He was advised to call immediately if he has any other concerning symptoms in the interval. The patient voices understanding of current disease status and treatment options and is in agreement with the current care plan.  All questions were answered. The patient knows to call the clinic with any problems, questions or concerns. We can certainly see the patient much sooner if necessary.  Disclaimer: This note was dictated with voice recognition software. Similar sounding words can inadvertently be transcribed and may not be corrected upon review.

## 2022-01-25 ENCOUNTER — Telehealth: Payer: Self-pay | Admitting: Internal Medicine

## 2022-01-25 NOTE — Telephone Encounter (Signed)
Called patient regarding upcoming appointments, patient is notified. 

## 2022-02-14 ENCOUNTER — Inpatient Hospital Stay: Payer: Medicare Other | Attending: Internal Medicine

## 2022-02-14 DIAGNOSIS — N189 Chronic kidney disease, unspecified: Secondary | ICD-10-CM | POA: Insufficient documentation

## 2022-02-14 DIAGNOSIS — I129 Hypertensive chronic kidney disease with stage 1 through stage 4 chronic kidney disease, or unspecified chronic kidney disease: Secondary | ICD-10-CM | POA: Diagnosis not present

## 2022-02-14 DIAGNOSIS — C787 Secondary malignant neoplasm of liver and intrahepatic bile duct: Secondary | ICD-10-CM | POA: Diagnosis not present

## 2022-02-14 DIAGNOSIS — D631 Anemia in chronic kidney disease: Secondary | ICD-10-CM | POA: Diagnosis not present

## 2022-02-14 DIAGNOSIS — C3490 Malignant neoplasm of unspecified part of unspecified bronchus or lung: Secondary | ICD-10-CM

## 2022-02-14 DIAGNOSIS — C3491 Malignant neoplasm of unspecified part of right bronchus or lung: Secondary | ICD-10-CM | POA: Diagnosis present

## 2022-02-14 LAB — CMP (CANCER CENTER ONLY)
ALT: 16 U/L (ref 0–44)
AST: 21 U/L (ref 15–41)
Albumin: 4.4 g/dL (ref 3.5–5.0)
Alkaline Phosphatase: 71 U/L (ref 38–126)
Anion gap: 6 (ref 5–15)
BUN: 23 mg/dL (ref 8–23)
CO2: 26 mmol/L (ref 22–32)
Calcium: 9.3 mg/dL (ref 8.9–10.3)
Chloride: 104 mmol/L (ref 98–111)
Creatinine: 1.69 mg/dL — ABNORMAL HIGH (ref 0.61–1.24)
GFR, Estimated: 44 mL/min — ABNORMAL LOW (ref >60)
Glucose, Bld: 269 mg/dL — ABNORMAL HIGH (ref 70–99)
Potassium: 4.3 mmol/L (ref 3.5–5.1)
Sodium: 136 mmol/L (ref 135–145)
Total Bilirubin: 0.4 mg/dL (ref 0.3–1.2)
Total Protein: 7.9 g/dL (ref 6.5–8.1)

## 2022-02-14 LAB — CBC WITH DIFFERENTIAL (CANCER CENTER ONLY)
Abs Immature Granulocytes: 0.01 10*3/uL (ref 0.00–0.07)
Basophils Absolute: 0.1 10*3/uL (ref 0.0–0.1)
Basophils Relative: 1 %
Eosinophils Absolute: 0.2 10*3/uL (ref 0.0–0.5)
Eosinophils Relative: 3 %
HCT: 31.9 % — ABNORMAL LOW (ref 39.0–52.0)
Hemoglobin: 10.1 g/dL — ABNORMAL LOW (ref 13.0–17.0)
Immature Granulocytes: 0 %
Lymphocytes Relative: 42 %
Lymphs Abs: 2.2 10*3/uL (ref 0.7–4.0)
MCH: 27.2 pg (ref 26.0–34.0)
MCHC: 31.7 g/dL (ref 30.0–36.0)
MCV: 85.8 fL (ref 80.0–100.0)
Monocytes Absolute: 0.5 10*3/uL (ref 0.1–1.0)
Monocytes Relative: 10 %
Neutro Abs: 2.3 10*3/uL (ref 1.7–7.7)
Neutrophils Relative %: 44 %
Platelet Count: 266 10*3/uL (ref 150–400)
RBC: 3.72 MIL/uL — ABNORMAL LOW (ref 4.22–5.81)
RDW: 16.9 % — ABNORMAL HIGH (ref 11.5–15.5)
WBC Count: 5.2 10*3/uL (ref 4.0–10.5)
nRBC: 0 % (ref 0.0–0.2)

## 2022-02-19 ENCOUNTER — Other Ambulatory Visit: Payer: Self-pay | Admitting: Nurse Practitioner

## 2022-02-19 DIAGNOSIS — I2510 Atherosclerotic heart disease of native coronary artery without angina pectoris: Secondary | ICD-10-CM

## 2022-02-19 DIAGNOSIS — E119 Type 2 diabetes mellitus without complications: Secondary | ICD-10-CM

## 2022-02-19 DIAGNOSIS — I10 Essential (primary) hypertension: Secondary | ICD-10-CM

## 2022-02-19 DIAGNOSIS — I517 Cardiomegaly: Secondary | ICD-10-CM

## 2022-02-19 DIAGNOSIS — G620 Drug-induced polyneuropathy: Secondary | ICD-10-CM

## 2022-03-06 ENCOUNTER — Other Ambulatory Visit: Payer: Self-pay | Admitting: Physician Assistant

## 2022-03-08 ENCOUNTER — Ambulatory Visit (INDEPENDENT_AMBULATORY_CARE_PROVIDER_SITE_OTHER): Payer: Medicare Other | Admitting: Nurse Practitioner

## 2022-03-08 ENCOUNTER — Encounter: Payer: Self-pay | Admitting: Nurse Practitioner

## 2022-03-08 VITALS — BP 139/70 | HR 86 | Temp 98.4°F | Ht 75.0 in | Wt 230.2 lb

## 2022-03-08 DIAGNOSIS — E1151 Type 2 diabetes mellitus with diabetic peripheral angiopathy without gangrene: Secondary | ICD-10-CM

## 2022-03-08 DIAGNOSIS — R2681 Unsteadiness on feet: Secondary | ICD-10-CM | POA: Diagnosis not present

## 2022-03-08 DIAGNOSIS — Z23 Encounter for immunization: Secondary | ICD-10-CM | POA: Insufficient documentation

## 2022-03-08 LAB — POCT GLYCOSYLATED HEMOGLOBIN (HGB A1C): HbA1c, POC (controlled diabetic range): 8.1 % — AB (ref 0.0–7.0)

## 2022-03-08 MED ORDER — ROLLATOR ULTRA-LIGHT MISC: 1.0000 | Freq: Every day | 0 refills | Status: AC

## 2022-03-08 NOTE — Assessment & Plan Note (Signed)
-   Flu Vaccine QUAD 52mo+IM (Fluarix, Fluzone & Alfiuria Quad PF)  2. Diabetes mellitus with peripheral vascular disease ([facility])  - POCT glycosylated hemoglobin (Hb A1C) - AMB Referral to Pharmacy Medication Management  Follow up:  Follow up in 3 months or sooner

## 2022-03-08 NOTE — Progress Notes (Signed)
@Patient  ID: Kirk [name], male    DOB: [birth year], 66 y.o.   MRN: [id number]  Chief Complaint  Patient presents with   Diabetes    Referring provider: Teena Dunk, NP   HPI  Kirk [name] 66 y.o. male  has a past medical history of Arthritis, Atrial flutter with rapid ventricular response ([place]) (04/19/2019), Dizziness (10/2019), Dyspnea, Elevated serum creatinine (07/2020), HTN (hypertension), Hyperlipidemia, Lung cancer ([place]) (05/2019), Microalbuminuria (07/2020), NSTEMI (non-ST elevated myocardial infarction) ([place]) (09/04/2015), PAD (peripheral artery disease) ([place]), Refusal of blood transfusions as patient is Jehovah's Witness, Type II diabetes mellitus ([place]), and Vitamin D deficiency (07/2020). To the [facility] for reevaluation of DM2.    Patient states that he was diagnosed and treated for stage 4 lung cancer. States that he completed six rounds of chemotherapy and oral therapy. Was informed by oncologist that cancer is stable, but not in remission. Completes follow up every 3 mths for reevaluation of lung cancer.    Diabetes Mellitus: Patient presents for follow up of diabetes. Symptoms: none.  Patient denies foot ulcerations, hypoglycemia , nausea, and polydipsia.  Evaluation to date has been included: hemoglobin A1C.  Home sugars: BG. Treatment to date: no recent interventions. Denies monitoring meals, but walks his dog daily. Hgb a1c 8.2. Has not been taking metformin but once daily. He is concerned about kidney function. States that nephrology wanted him to start jardiance, but   Denies any other concerns today. Denies any fatigue, chest pain, shortness of breath, HA or dizziness. Denies any blurred vision, numbness or tingling.    No Known Allergies  Immunization History  Administered Date(s) Administered   Fluad Quad(high Dose 65+) 03/21/2020, 03/15/2021, 03/08/2022   Influenza, High Dose Seasonal PF 03/22/2019   PFIZER(Purple Top)SARS-COV-2 Vaccination 01/18/2020,  07/05/2020, 08/02/2020, 09/27/2020   Pneumococcal Conjugate-13 03/22/2019    Past Medical History:  Diagnosis Date   Arthritis    "right knee" (09/04/2015)   Atrial flutter with rapid ventricular response ([place]) 04/19/2019   Dizziness 10/2019   Dyspnea    increased exertion   Elevated serum creatinine 07/2020   HTN (hypertension)    Hyperlipidemia    Lung cancer ([place]) 05/2019   Microalbuminuria 07/2020   NSTEMI (non-ST elevated myocardial infarction) ([place]) 09/04/2015   a. cath 09/05/2015: 95% stenosis mid-LAD, 55% mid-RCA, and 40% prox-RCA. PCI performed w/ Synergy DES to LAD   PAD (peripheral artery disease) ([facility])    Stenting of bilateral iliacs in 2003.   Refusal of blood transfusions as patient is Jehovah's Witness    Type II diabetes mellitus ([place])    Vitamin D deficiency 07/2020    Tobacco History: Social History   Tobacco Use  Smoking Status Former   Packs/day: 2.00   Years: 35.00   Total pack years: 70.00   Types: Cigarettes   Quit date: 02/19/2003   Years since quitting: 19.0  Smokeless Tobacco Never   Counseling given: Not Answered   Outpatient Encounter Medications as of 03/08/2022  Medication Sig   acetaminophen (TYLENOL) 500 MG tablet Take 500 mg by mouth every 6 (six) hours as needed for moderate pain or fever.    aspirin EC 81 MG tablet Take 81 mg by mouth at bedtime.   blood glucose meter kit and supplies Dispense based on patient and insurance preference. Use up to four times daily as directed. (FOR ICD-10 E10.9, E11.9).   carvedilol (COREG) 12.5 MG tablet TAKE 1 TABLET (12.5 MG TOTAL) BY MOUTH 2 (TWO) TIMES DAILY WITH  A MEAL.   clopidogrel (PLAVIX) 75 MG tablet TAKE 1 TABLET BY MOUTH EVERY DAY   docusate sodium (COLACE) 100 MG capsule TAKE 1 CAPSULE (100 MG TOTAL) BY MOUTH 2 (TWO) TIMES DAILY AS NEEDED FOR MILD CONSTIPATION.   ferrous sulfate 324 MG TBEC Take 1 tablet (324 mg total) by mouth daily with breakfast.   gabapentin (NEURONTIN) 300 MG capsule  TAKE 1 CAPSULE BY MOUTH THREE TIMES A DAY   glimepiride (AMARYL) 4 MG tablet TAKE 1 TABLET BY MOUTH EVERY DAY   glucose blood (ACCU-CHEK GUIDE) test strip Use as instructed up to four times daily as directed.   lidocaine-prilocaine (EMLA) cream APPLY TO THE PORT-A-CATH SITE 30-60 MINUTES BEFORE TREATMENT. (Patient taking differently: Apply 1 application  topically daily as needed (access port). Apply to the Port-A-Cath site 30-60 minutes before treatment.)   metFORMIN (GLUCOPHAGE) 1000 MG tablet TAKE 1 TABLET (1,000 MG TOTAL) BY MOUTH 2 (TWO) TIMES DAILY WITH A MEAL.   Misc. Devices (ROLLATOR ULTRA-LIGHT) MISC 1 each by Does not apply route daily.   multivitamin (ONE-A-DAY MEN'S) TABS tablet Take 1 tablet by mouth daily.   nitroGLYCERIN (NITROSTAT) 0.4 MG SL tablet Place 1 tablet (0.4 mg total) under the tongue every 5 (five) minutes x 3 doses as needed for chest pain.   rosuvastatin (CRESTOR) 40 MG tablet TAKE 1 TABLET BY MOUTH EVERY DAY   No facility-administered encounter medications on file as of 03/08/2022.     Review of Systems  Review of Systems  Constitutional: Negative.   HENT: Negative.    Cardiovascular: Negative.   Gastrointestinal: Negative.   Allergic/Immunologic: Negative.   Neurological: Negative.   Psychiatric/Behavioral: Negative.         Physical Exam  BP 139/70   Pulse 86   Temp 98.4 F (36.9 C) (Temporal)   Ht 6' 3"  (1.905 m)   Wt 230 lb 3.2 oz (104.4 kg)   SpO2 98%   BMI 28.77 kg/m   Wt Readings from Last 5 Encounters:  03/08/22 230 lb 3.2 oz (104.4 kg)  01/16/22 222 lb 5 oz (100.8 kg)  10/16/21 223 lb 8 oz (101.4 kg)  09/07/21 227 lb (103 kg)  07/23/21 225 lb 9.6 oz (102.3 kg)     Physical Exam Vitals and nursing note reviewed.  Constitutional:      General: He is not in acute distress.    Appearance: He is well-developed.  Cardiovascular:     Rate and Rhythm: Normal rate and regular rhythm.  Pulmonary:     Effort: Pulmonary effort is  normal.     Breath sounds: Normal breath sounds.  Skin:    General: Skin is warm and dry.  Neurological:     Mental Status: He is alert and oriented to person, place, and time.      Lab Results:  CBC    Component Value Date/Time   WBC 5.2 02/14/2022 1324   WBC 9.2 12/25/2019 1850   RBC 3.72 (L) 02/14/2022 1324   HGB 10.1 (L) 02/14/2022 1324   HCT 31.9 (L) 02/14/2022 1324   PLT 266 02/14/2022 1324   MCV 85.8 02/14/2022 1324   MCH 27.2 02/14/2022 1324   MCHC 31.7 02/14/2022 1324   RDW 16.9 (H) 02/14/2022 1324   LYMPHSABS 2.2 02/14/2022 1324   MONOABS 0.5 02/14/2022 1324   EOSABS 0.2 02/14/2022 1324   BASOSABS 0.1 02/14/2022 1324    BMET    Component Value Date/Time   NA 136 02/14/2022 1324  NA 140 06/08/2015 0840   K 4.3 02/14/2022 1324   CL 104 02/14/2022 1324   CO2 26 02/14/2022 1324   GLUCOSE 269 (H) 02/14/2022 1324   BUN 23 02/14/2022 1324   BUN 15 06/08/2015 0840   CREATININE 1.69 (H) 02/14/2022 1324   CREATININE 1.06 10/01/2016 0855   CALCIUM 9.3 02/14/2022 1324   GFRNONAA 44 (L) 02/14/2022 1324   GFRNONAA 75 10/01/2016 0855   GFRAA 58 (L) 02/10/2020 1120   GFRAA 87 10/01/2016 0855      Assessment & Plan:   Need for immunization against influenza - Flu Vaccine QUAD 41moIM (Fluarix, Fluzone & Alfiuria Quad PF)  2. Diabetes mellitus with peripheral vascular disease ([place]  - POCT glycosylated hemoglobin (Hb A1C) - AMB Referral to Pharmacy Medication Management  Follow up:  Follow up in 3 months or sooner     TFenton Foy NP 03/08/2022

## 2022-03-08 NOTE — Patient Instructions (Signed)
1. Need for immunization against influenza  - Flu Vaccine QUAD 29mo+IM (Fluarix, Fluzone & Alfiuria Quad PF)  2. Diabetes mellitus with peripheral vascular disease ([place])  - POCT glycosylated hemoglobin (Hb A1C) - AMB Referral to Pharmacy Medication Management  Follow up:  Follow up in 3 months or sooner

## 2022-03-08 NOTE — Progress Notes (Signed)
Patient has been eating ice in excess. Left foot numbness.

## 2022-03-11 ENCOUNTER — Telehealth: Payer: Self-pay

## 2022-03-11 NOTE — Chronic Care Management (AMB) (Signed)
Care Guide Note  03/11/2022 Name: Kirk [name] MRN: [id number] DOB: [birth year]  Referred by: Ivonne Andrew, NP Reason for referral : Care Coordination (Outreach to schedule referral with Pharm D Catie )   Kirk [name] is a 66 y.o. year old male who is a primary care patient of Ivonne Andrew, NP. Kirk [name] was referred to the pharmacist for assistance related to DM.    Successful contact was made with the patient to discuss pharmacy services including being ready for the pharmacist to call at least 5 minutes before the scheduled appointment time, to have medication bottles and any blood sugar or blood pressure readings ready for review. The patient agreed to meet with the pharmacist via with the pharmacist via telephone visit on 03/18/2022.    Penne Lash, RMA Care Guide [facility] [place], [place] [address] Direct Dial: [phone] [email or web address]

## 2022-03-18 ENCOUNTER — Other Ambulatory Visit: Payer: Medicare Other | Admitting: Pharmacist

## 2022-03-18 ENCOUNTER — Telehealth: Payer: Self-pay | Admitting: Pharmacist

## 2022-03-18 DIAGNOSIS — E1151 Type 2 diabetes mellitus with diabetic peripheral angiopathy without gangrene: Secondary | ICD-10-CM

## 2022-03-18 NOTE — Progress Notes (Unsigned)
03/18/2022 Name: Kirk [name] MRN: [id number] DOB: [birth year]  Chief Complaint  Patient presents with   Medication Management   Diabetes   Hypertension    Kirk [name] is a 66 y.o. year old male who presented for a telephone visit.   They were referred to the pharmacist by their PCP for assistance in managing diabetes, hypertension, and hyperlipidemia.   Patient is participating in a Managed Medicaid Plan:  Yes  Subjective:  Care Team: Primary Care Provider: Fenton Foy, NP ; Next Scheduled Visit: none scheduled Oncology: [facility]; Next Schedule Visit: 04/23/22  Medication Access/Adherence  [facility]  [id number]: [phone]: [address] [address]Phone: [phone]: [phone]  Patient reports affordability concerns with their medications: Yes  Patient reports access/transportation concerns to their pharmacy: No  Patient reports adherence concerns with their medications:  No     Diabetes:  Current medications: metformin 1000 mg once daily - self reduced dose after talking with nephrology; glimepiride 4 mg QPM  Patient denies hypoglycemic s/sx including dizziness, shakiness, sweating. Patient denies hyperglycemic symptoms including polyuria, polydipsia, polyphagia, nocturia, neuropathy, blurred vision.  Reports cost for SGLT2 was >$400  Hypertension:  Current medications: carvedilol 12.5 mg twice daily   Hyperlipidemia/ASCVD Risk Reduction  Current lipid lowering medications: rosuvastatin 40 mg daily   Antiplatelet regimen: aspirin 81 mg daily   Health Maintenance  Health Maintenance Due  Topic Date Due   Zoster Vaccines- Shingrix (1 of 2) Never done   COVID-19 Vaccine (5 - Pfizer risk series) 11/22/2020   FOOT EXAM   02/28/2022     Objective: Lab Results  Component Value Date   HGBA1C 8.1 (A) 03/08/2022    Lab Results  Component Value Date   CREATININE 1.69 (H) 02/14/2022   BUN 23 02/14/2022   NA 136 02/14/2022   K 4.3 02/14/2022   CL 104 02/14/2022   CO2 26 02/14/2022    Lab Results  Component Value Date   CHOL 117 06/28/2020   HDL 35 (L) 06/28/2020   LDLCALC 59 06/28/2020   TRIG 132 06/28/2020   CHOLHDL 3.3 06/28/2020    Medications Reviewed Today     Reviewed by NFenton Foy NP (Nurse Practitioner) on 03/08/22 at 1512  Med List Status: <None>   Medication Order Taking? Sig Documenting Provider Last Dose Status Informant  acetaminophen (TYLENOL) 500 MG tablet [id number] Take 500 mg by mouth every 6 (six) hours as needed for moderate pain or fever.  [provider] Taking Active Self  aspirin EC 81 MG tablet [id number] Take 81 mg by mouth at bedtime. [provider] Taking Active Self  blood glucose meter kit and supplies [id number] Dispense based on patient and insurance preference. Use up to four times daily as directed. (FOR ICD-10 E10.9, E11.9). KVevelyn Francois NP Taking Active   carvedilol (COREG) 12.5 MG tablet [id number] TAKE 1 TABLET (12.5 MG TOTAL) BY MOUTH 2 (TWO) TIMES DAILY WITH A MEAL. NFenton Foy NP Taking Active   clopidogrel (PLAVIX) 75 MG tablet [id number] TAKE 1 TABLET BY MOUTH EVERY DAY NFenton Foy NP Taking Active   docusate sodium (COLACE) 100 MG capsule [id number] TAKE 1 CAPSULE (100 MG TOTAL) BY MOUTH 2 (TWO) TIMES DAILY AS NEEDED FOR MILD  CONSTIPATION. Fenton Foy, NP Taking Active   ferrous sulfate 324 MG TBEC [id number] Yes Take 1 tablet (324 mg total) by mouth daily with breakfast. [name], FNP Taking Active   gabapentin (NEURONTIN) 300 MG capsule [id number] Yes TAKE 1 CAPSULE BY MOUTH THREE TIMES A DAY Fenton Foy, NP Taking Consider Medication Status and Discontinue   glimepiride (AMARYL)  4 MG tablet [id number] Yes TAKE 1 TABLET BY MOUTH EVERY DAY Fenton Foy, NP Taking Active   glucose blood (ACCU-CHEK GUIDE) test strip [id number] Yes Use as instructed up to four times daily as directed. [name], NP Taking Active   lidocaine-prilocaine (EMLA) cream [id number] Yes APPLY TO THE PORT-A-CATH SITE 30-60 MINUTES BEFORE TREATMENT.  Patient taking differently: Apply 1 application  topically daily as needed (access port). Apply to the Port-A-Cath site 30-60 minutes before treatment.   Curt Bears, MD Taking Active Self  metFORMIN (GLUCOPHAGE) 1000 MG tablet [id number] Yes TAKE 1 TABLET (1,000 MG TOTAL) BY MOUTH 2 (TWO) TIMES DAILY WITH A MEAL. Fenton Foy, NP Taking Active   Misc. Devices (ROLLATOR [place]) [place] [id number] Yes 1 each by Does not apply route daily. Fenton Foy, NP  Active   multivitamin (ONE-A-DAY MEN'S) TABS tablet [id number] Yes Take 1 tablet by mouth daily. [name], FNP Taking Active Self  nitroGLYCERIN (NITROSTAT) 0.4 MG SL tablet [id number] Yes Place 1 tablet (0.4 mg total) under the tongue every 5 (five) minutes x 3 doses as needed for chest pain. Imogene Burn, PA-C Taking Active            Med Note [name]   Mon Aug 09, 2019  4:23 PM) As needed  rosuvastatin (CRESTOR) 40 MG tablet [id number] Yes TAKE 1 TABLET BY MOUTH EVERY DAY Fenton Foy, NP Taking Active               Assessment/Plan:   Diabetes: - Currently uncontrolled - Reviewed long term cardiovascular and renal outcomes of uncontrolled blood sugar - Reviewed goal A1c, goal fasting, and goal 2 hour post prandial glucose - Reviewed [facility] PDP plan. Patient has a $505 deductible that he has not paid through yet, explaining the $495 cost of the [place]. He would likely pay $494 this month, then about $25 next month to round out the deductible before he will payt the $11/month copay for drugs on this Tier. Discussed concepts of evaluating insurance  plans, discussed [facility]. He requests to hold on medication adjustments until January to avoid paying through the deductible now and then again in 2 months.  - Recommend to change metformin from 1000 mg daily to 500 mg BID for better tolerability. Max daily dose of 1000 mg appropriate for renal function - Recommend to check glucose twice daily, fasting and 2 hour post prandial. Encouraged focus on dietary adjustments.   Hypertension: - Currently controlled - Reviewed appropriate blood pressure monitoring technique and reviewed goal blood pressure. Recommended to check home blood pressure and heart rate periodically - Recommend to continue current regimen at this time    Hyperlipidemia/ASCVD Risk Reduction: - Currently controlled.  - Recommend to continue current regimen at this time   Follow Up Plan: phone call in 6 weeks  Catie TJodi Mourning, PharmD, [place] [phone]

## 2022-03-18 NOTE — Progress Notes (Unsigned)
Attempted to contact patient for scheduled appointment for medication management. Left HIPAA compliant message for patient to return my call at their convenience.   [name], PharmD, [facility] [phone]

## 2022-03-20 MED ORDER — DAPAGLIFLOZIN PROPANEDIOL 10 MG PO TABS: 10.0000 mg | ORAL_TABLET | Freq: Every day | ORAL | 2 refills | Status: DC

## 2022-03-20 MED ORDER — METFORMIN HCL ER 500 MG PO TB24: 1000.0000 mg | ORAL_TABLET | Freq: Two times a day (BID) | ORAL | 3 refills | Status: DC

## 2022-03-21 MED ORDER — METFORMIN HCL ER 500 MG PO TB24: 500.0000 mg | ORAL_TABLET | Freq: Two times a day (BID) | ORAL | 3 refills | Status: DC

## 2022-03-21 NOTE — Patient Instructions (Addendum)
Kirk [name],   [name] [facility] is the group that helps people navigate choosing Medicare plans - [phone], or you can call the [facility] office at [name] - [address] [name], [name] [address] [phone] ext [phone]).   Check your blood sugars twice daily:  1) Fasting, first thing in the morning before breakfast and  2) 2 hours after your largest meal.   For a goal A1c of less than 7%, goal fasting readings are less than 130 and goal 2 hour after meal readings are less than 180.   Take care!  [name], PharmD, [facility] [phone]

## 2022-03-25 ENCOUNTER — Telehealth: Payer: Self-pay | Admitting: Medical Oncology

## 2022-03-25 NOTE — Telephone Encounter (Signed)
Eating a lot of ice . I start in the morning and eat it all day long. I have broken 2 partials .  Iron infusion -Asking to see [name] to find out if he needs iron infusion.  He has port flush appt on Thursday.   Do you want any labs drawn on thursday ?

## 2022-03-26 ENCOUNTER — Other Ambulatory Visit: Payer: Self-pay | Admitting: Internal Medicine

## 2022-03-26 DIAGNOSIS — D509 Iron deficiency anemia, unspecified: Secondary | ICD-10-CM

## 2022-03-28 ENCOUNTER — Inpatient Hospital Stay: Payer: Medicare Other | Attending: Internal Medicine

## 2022-03-28 ENCOUNTER — Other Ambulatory Visit: Payer: Self-pay | Admitting: Internal Medicine

## 2022-03-28 DIAGNOSIS — C787 Secondary malignant neoplasm of liver and intrahepatic bile duct: Secondary | ICD-10-CM | POA: Insufficient documentation

## 2022-03-28 DIAGNOSIS — D509 Iron deficiency anemia, unspecified: Secondary | ICD-10-CM

## 2022-03-28 DIAGNOSIS — E1151 Type 2 diabetes mellitus with diabetic peripheral angiopathy without gangrene: Secondary | ICD-10-CM | POA: Diagnosis not present

## 2022-03-28 DIAGNOSIS — C3491 Malignant neoplasm of unspecified part of right bronchus or lung: Secondary | ICD-10-CM | POA: Insufficient documentation

## 2022-03-28 DIAGNOSIS — I10 Essential (primary) hypertension: Secondary | ICD-10-CM | POA: Insufficient documentation

## 2022-03-28 DIAGNOSIS — N289 Disorder of kidney and ureter, unspecified: Secondary | ICD-10-CM | POA: Diagnosis not present

## 2022-03-28 DIAGNOSIS — Z95828 Presence of other vascular implants and grafts: Secondary | ICD-10-CM

## 2022-03-28 LAB — CBC WITH DIFFERENTIAL (CANCER CENTER ONLY)
Abs Immature Granulocytes: 0.02 10*3/uL (ref 0.00–0.07)
Basophils Absolute: 0.1 10*3/uL (ref 0.0–0.1)
Basophils Relative: 1 %
Eosinophils Absolute: 0.2 10*3/uL (ref 0.0–0.5)
Eosinophils Relative: 2 %
HCT: 33.3 % — ABNORMAL LOW (ref 39.0–52.0)
Hemoglobin: 10.8 g/dL — ABNORMAL LOW (ref 13.0–17.0)
Immature Granulocytes: 0 %
Lymphocytes Relative: 36 %
Lymphs Abs: 2.6 10*3/uL (ref 0.7–4.0)
MCH: 27 pg (ref 26.0–34.0)
MCHC: 32.4 g/dL (ref 30.0–36.0)
MCV: 83.3 fL (ref 80.0–100.0)
Monocytes Absolute: 0.7 10*3/uL (ref 0.1–1.0)
Monocytes Relative: 10 %
Neutro Abs: 3.6 10*3/uL (ref 1.7–7.7)
Neutrophils Relative %: 51 %
Platelet Count: 291 10*3/uL (ref 150–400)
RBC: 4 MIL/uL — ABNORMAL LOW (ref 4.22–5.81)
RDW: 15.7 % — ABNORMAL HIGH (ref 11.5–15.5)
WBC Count: 7.1 10*3/uL (ref 4.0–10.5)
nRBC: 0 % (ref 0.0–0.2)

## 2022-03-28 LAB — FERRITIN: Ferritin: 5 ng/mL — ABNORMAL LOW (ref 24–336)

## 2022-03-28 LAB — IRON AND IRON BINDING CAPACITY (CC-WL,HP ONLY)
Iron: 58 ug/dL (ref 45–182)
Saturation Ratios: 11 % — ABNORMAL LOW (ref 17.9–39.5)
TIBC: 529 ug/dL — ABNORMAL HIGH (ref 250–450)
UIBC: 471 ug/dL — ABNORMAL HIGH (ref 117–376)

## 2022-03-28 MED ORDER — HEPARIN SOD (PORK) LOCK FLUSH 100 UNIT/ML IV SOLN
500.0000 [IU] | INTRAVENOUS | Status: AC | PRN
  Administered 2022-03-28: 500 [IU]

## 2022-03-28 MED ORDER — SODIUM CHLORIDE 0.9% FLUSH
10.0000 mL | Freq: Once | INTRAVENOUS | Status: AC
  Administered 2022-03-28: 10 mL

## 2022-03-29 ENCOUNTER — Other Ambulatory Visit: Payer: Self-pay | Admitting: Pharmacy Technician

## 2022-03-29 ENCOUNTER — Telehealth: Payer: Self-pay | Admitting: Pharmacy Technician

## 2022-03-29 NOTE — Telephone Encounter (Signed)
Dr. [name],  Medicare will not cover Venofer if the patient does not have CKD with IDA.   Dx code: D50.9 preferred medication is Feraheme. Would you like to use feraheme?

## 2022-03-29 NOTE — Telephone Encounter (Signed)
Auth Submission: NO AUTH NEEDED Payer: MEDICARE A/B  Medication & CPT/J Code(s) submitted: Venofer (Iron Sucrose) J1756 Route of submission (phone, fax, portal):  Phone # Fax # Auth type: Buy/Bill Units/visits requested: X3 Reference number:  Approval from: 03/29/22 to 05/26/22

## 2022-04-01 ENCOUNTER — Other Ambulatory Visit: Payer: Self-pay | Admitting: Pharmacy Technician

## 2022-04-01 ENCOUNTER — Encounter: Payer: Self-pay | Admitting: Internal Medicine

## 2022-04-01 ENCOUNTER — Other Ambulatory Visit: Payer: Self-pay | Admitting: Internal Medicine

## 2022-04-04 ENCOUNTER — Ambulatory Visit (INDEPENDENT_AMBULATORY_CARE_PROVIDER_SITE_OTHER): Payer: Medicare Other

## 2022-04-04 VITALS — BP 165/67 | HR 77 | Temp 97.4°F | Resp 18 | Ht 75.0 in | Wt 232.2 lb

## 2022-04-04 DIAGNOSIS — D509 Iron deficiency anemia, unspecified: Secondary | ICD-10-CM | POA: Diagnosis not present

## 2022-04-04 MED ORDER — DIPHENHYDRAMINE HCL 25 MG PO CAPS
25.0000 mg | ORAL_CAPSULE | Freq: Once | ORAL | Status: AC
  Administered 2022-04-04: 25 mg via ORAL
  Filled 2022-04-04: qty 1

## 2022-04-04 MED ORDER — SODIUM CHLORIDE 0.9 % IV SOLN
510.0000 mg | Freq: Once | INTRAVENOUS | Status: AC
  Administered 2022-04-04: 510 mg via INTRAVENOUS
  Filled 2022-04-04: qty 17

## 2022-04-04 MED ORDER — ACETAMINOPHEN 325 MG PO TABS
650.0000 mg | ORAL_TABLET | Freq: Once | ORAL | Status: AC
  Administered 2022-04-04: 650 mg via ORAL
  Filled 2022-04-04: qty 2

## 2022-04-04 NOTE — Progress Notes (Signed)
Diagnosis: Iron Deficiency Anemia  Provider:  Marshell Garfinkel MD  Procedure: Infusion  IV Type: Port a Cath, IV Location: R Chest  Feraheme (Ferumoxytol), Dose: 510 mg  Infusion Start Time: 3159  Infusion Stop Time: 1400  Post Infusion IV Care: Patient declined observation and Port a Cath Deaccessed/Flushed  Discharge: Condition: Good, Destination: Home . AVS provided to patient.   Performed by:  Cleophus Molt, RN

## 2022-04-11 ENCOUNTER — Telehealth: Payer: Self-pay | Admitting: Internal Medicine

## 2022-04-11 ENCOUNTER — Ambulatory Visit (INDEPENDENT_AMBULATORY_CARE_PROVIDER_SITE_OTHER): Payer: Medicare Other

## 2022-04-11 VITALS — BP 138/70 | HR 83 | Temp 98.0°F | Resp 18 | Ht 75.0 in | Wt 233.0 lb

## 2022-04-11 DIAGNOSIS — D509 Iron deficiency anemia, unspecified: Secondary | ICD-10-CM | POA: Diagnosis not present

## 2022-04-11 MED ORDER — SODIUM CHLORIDE 0.9 % IV SOLN
510.0000 mg | Freq: Once | INTRAVENOUS | Status: AC
  Administered 2022-04-11: 510 mg via INTRAVENOUS
  Filled 2022-04-11: qty 17

## 2022-04-11 MED ORDER — DIPHENHYDRAMINE HCL 25 MG PO CAPS: 25.0000 mg | ORAL_CAPSULE | Freq: Once | ORAL | Status: DC

## 2022-04-11 MED ORDER — ACETAMINOPHEN 325 MG PO TABS: 650.0000 mg | ORAL_TABLET | Freq: Once | ORAL | Status: DC

## 2022-04-11 MED ORDER — HEPARIN SOD (PORK) LOCK FLUSH 100 UNIT/ML IV SOLN
500.0000 [IU] | Freq: Once | INTRAVENOUS | Status: AC | PRN
  Administered 2022-04-11: 500 [IU]
  Filled 2022-04-11: qty 5

## 2022-04-11 NOTE — Telephone Encounter (Signed)
Rescheduled 11/28 to 11/22 per provider pal, patient has been called and notified.

## 2022-04-11 NOTE — Progress Notes (Signed)
Diagnosis: Iron Deficiency Anemia  Provider:  Marshell Garfinkel MD  Procedure: Infusion  IV Type: Port a Cath, IV Location: R Chest  Feraheme (Ferumoxytol), Dose: 510 mg  Infusion Start Time: 1191  Infusion Stop Time: 1412  Post Infusion IV Care: Port a Cath Deaccessed/Flushed, Heparin locked .  Discharge: Condition: Good, Destination: Home . AVS provided to patient.   Performed by:  Cleophus Molt, RN

## 2022-04-13 ENCOUNTER — Ambulatory Visit (HOSPITAL_COMMUNITY)
Admission: RE | Admit: 2022-04-13 | Discharge: 2022-04-13 | Disposition: A | Discharge status: Home / Self Care | Payer: Medicare Other | Source: Ambulatory Visit | Attending: Internal Medicine | Admitting: Internal Medicine

## 2022-04-13 DIAGNOSIS — C3490 Malignant neoplasm of unspecified part of unspecified bronchus or lung: Secondary | ICD-10-CM

## 2022-04-15 ENCOUNTER — Other Ambulatory Visit: Payer: Medicare Other

## 2022-04-16 ENCOUNTER — Other Ambulatory Visit: Payer: Self-pay | Admitting: Medical Oncology

## 2022-04-16 DIAGNOSIS — D509 Iron deficiency anemia, unspecified: Secondary | ICD-10-CM

## 2022-04-17 ENCOUNTER — Inpatient Hospital Stay (HOSPITAL_BASED_OUTPATIENT_CLINIC_OR_DEPARTMENT_OTHER): Payer: Medicare Other | Admitting: Internal Medicine

## 2022-04-17 ENCOUNTER — Inpatient Hospital Stay: Payer: Medicare Other

## 2022-04-17 VITALS — BP 154/66 | HR 86 | Temp 97.7°F | Resp 16 | Wt 230.7 lb

## 2022-04-17 DIAGNOSIS — C3491 Malignant neoplasm of unspecified part of right bronchus or lung: Secondary | ICD-10-CM | POA: Diagnosis not present

## 2022-04-17 DIAGNOSIS — C3490 Malignant neoplasm of unspecified part of unspecified bronchus or lung: Secondary | ICD-10-CM | POA: Diagnosis not present

## 2022-04-17 DIAGNOSIS — Z95828 Presence of other vascular implants and grafts: Secondary | ICD-10-CM

## 2022-04-17 DIAGNOSIS — D509 Iron deficiency anemia, unspecified: Secondary | ICD-10-CM

## 2022-04-17 LAB — CMP (CANCER CENTER ONLY)
ALT: 27 U/L (ref 0–44)
AST: 23 U/L (ref 15–41)
Albumin: 4.6 g/dL (ref 3.5–5.0)
Alkaline Phosphatase: 75 U/L (ref 38–126)
Anion gap: 6 (ref 5–15)
BUN: 28 mg/dL — ABNORMAL HIGH (ref 8–23)
CO2: 28 mmol/L (ref 22–32)
Calcium: 10.1 mg/dL (ref 8.9–10.3)
Chloride: 106 mmol/L (ref 98–111)
Creatinine: 1.5 mg/dL — ABNORMAL HIGH (ref 0.61–1.24)
GFR, Estimated: 50 mL/min — ABNORMAL LOW (ref >60)
Glucose, Bld: 183 mg/dL — ABNORMAL HIGH (ref 70–99)
Potassium: 4.2 mmol/L (ref 3.5–5.1)
Sodium: 140 mmol/L (ref 135–145)
Total Bilirubin: 0.3 mg/dL (ref 0.3–1.2)
Total Protein: 8.1 g/dL (ref 6.5–8.1)

## 2022-04-17 LAB — CBC WITH DIFFERENTIAL (CANCER CENTER ONLY)
Abs Immature Granulocytes: 0.01 10*3/uL (ref 0.00–0.07)
Basophils Absolute: 0.1 10*3/uL (ref 0.0–0.1)
Basophils Relative: 1 %
Eosinophils Absolute: 0.2 10*3/uL (ref 0.0–0.5)
Eosinophils Relative: 3 %
HCT: 34.5 % — ABNORMAL LOW (ref 39.0–52.0)
Hemoglobin: 11.1 g/dL — ABNORMAL LOW (ref 13.0–17.0)
Immature Granulocytes: 0 %
Lymphocytes Relative: 39 %
Lymphs Abs: 2.7 10*3/uL (ref 0.7–4.0)
MCH: 28.3 pg (ref 26.0–34.0)
MCHC: 32.2 g/dL (ref 30.0–36.0)
MCV: 88 fL (ref 80.0–100.0)
Monocytes Absolute: 0.7 10*3/uL (ref 0.1–1.0)
Monocytes Relative: 11 %
Neutro Abs: 3.1 10*3/uL (ref 1.7–7.7)
Neutrophils Relative %: 46 %
Platelet Count: 253 10*3/uL (ref 150–400)
RBC: 3.92 MIL/uL — ABNORMAL LOW (ref 4.22–5.81)
RDW: 17.8 % — ABNORMAL HIGH (ref 11.5–15.5)
WBC Count: 6.8 10*3/uL (ref 4.0–10.5)
nRBC: 0 % (ref 0.0–0.2)

## 2022-04-17 MED ORDER — SODIUM CHLORIDE 0.9% FLUSH
10.0000 mL | Freq: Once | INTRAVENOUS | Status: AC
  Administered 2022-04-17: 10 mL

## 2022-04-17 NOTE — Progress Notes (Signed)
[place] [phone]   [phone]  OFFICE PROGRESS NOTE  Patient, No Pcp Per No address on file  DIAGNOSIS: Stage IV (TX, N3, M1c) non-small cell lung cancer, poorly differentiated adenocarcinoma presented with bulky mediastinal lymphadenopathy in addition to right axillary lymphadenopathy and metastatic liver lesions, abdominal lymphadenopathy diagnosed in January 2021. Molecular studies by [employer] that shows no actionable mutations.  PRIOR THERAPY: Systemic chemotherapy with carboplatin for AUC of 5, Alimta 500 mg/M2 and Keytruda 200 mg IV every 3 weeks.  First dose July 14, 2019.  Status post 35  cycles. Starting from cycle #5 the patient will be treated with maintenance Alimta and Keytruda every 3 weeks.  Starting from cycle #12 he will be on single agent treatment with Keytruda secondary to renal insufficiency.  CURRENT THERAPY: Observation  INTERVAL HISTORY: EDU ON 66 y.o. male returns to the clinic today for 80-monthfollow-up visit.  The patient is feeling fine today with no concerning complaints.  He denied having any significant fatigue or weakness.  He has no nausea, vomiting, diarrhea or constipation.  He has no headache or visual changes.  He has no chest pain, shortness of breath, cough or hemoptysis.  He had some craving for ice recently and the patient was found to have significant iron deficiency and treated with iron infusion with Venofer.  He is here today for evaluation with repeat CT scan of the chest, abdomen and pelvis for restaging of his disease.  MEDICAL HISTORY: Past Medical History:  Diagnosis Date   Arthritis    "right knee" (09/04/2015)   [place] 04/19/2019   Dizziness 10/2019   Dyspnea    increased exertion   Elevated serum creatinine 07/2020   HTN (hypertension)    Hyperlipidemia    [place]ung cancer [place] 05[place]   Microalbuminuria 07/2020   NSTEMI  (non-ST elevated myocardial infarction) ([facility] 09/04/2015   a. cath 09/05/2015: 95% stenosis mid-LAD, 55% mid-RCA, and 40% prox-RCA. PCI performed w/ Synergy DES to LAD   PAD (peripheral artery disease) ([facility])    Stenting of bilateral iliacs in 2003.   Refusal of blood transfusions as patient is Jehovah's Witness    [facility] II diabetes mellitus [place]    Vitamin D deficiency 07/2020    ALLERGIES:  has No Known Allergies.  MEDICATIONS:  Current Outpatient Medications  Medication Sig Dispense Refill   acetaminophen (TYLENOL) 500 MG tablet Take 500 mg by mouth every 6 (six) hours as needed for moderate pain or fever.      aspirin EC 81 MG tablet Take 81 mg by mouth at bedtime.     blood glucose meter kit and supplies Dispense based on patient and insurance preference. Use up to four times daily as directed. (FOR ICD-10 E10.9, E11.9). 1 each 0   carvedilol (COREG) 12.5 MG tablet TAKE 1 TABLET (12.5 MG TOTAL) BY MOUTH 2 (TWO) TIMES DAILY WITH A MEAL. 180 tablet 3   clopidogrel (PLAVIX) 75 MG tablet TAKE 1 TABLET BY MOUTH EVERY DAY 90 tablet 3   docusate sodium (COLACE) 100 MG capsule TAKE 1 CAPSULE (100 MG TOTAL) BY MOUTH 2 (TWO) TIMES DAILY AS NEEDED FOR MILD CONSTIPATION. 180 capsule 3   ferrous sulfate 324 MG TBEC Take 1 tablet (324 mg total) by mouth daily with breakfast. (Patient not taking: Reported on 03/18/2022) 30 tablet 11   gabapentin (NEURONTIN) 300 MG capsule TAKE 1 CAPSULE BY MOUTH THREE TIMES A  DAY 270 capsule 3   glimepiride (AMARYL) 4 MG tablet TAKE 1 TABLET BY MOUTH EVERY DAY 90 tablet 3   glucose blood (ACCU-CHEK GUIDE) test strip Use as instructed up to four times daily as directed. 100 each 12   lidocaine-prilocaine (EMLA) cream APPLY TO THE PORT-A-CATH SITE 30-60 MINUTES BEFORE TREATMENT. (Patient taking differently: Apply 1 application  topically daily as needed (access port). Apply to the Port-A-Cath site 30-60 minutes before treatment.) 30 g 0   metFORMIN (GLUCOPHAGE-XR) 500 MG  24 hr tablet Take 1 tablet (500 mg total) by mouth 2 (two) times daily with a meal. 180 tablet 3   Misc. Devices (ROLLATOR ULTRA-LIGHT) MISC 1 each by Does not apply route daily. 1 each 0   multivitamin (ONE-A-DAY MEN'S) TABS tablet Take 1 tablet by mouth daily. (Patient not taking: Reported on 03/18/2022) 30 tablet 6   nitroGLYCERIN (NITROSTAT) 0.4 MG SL tablet Place 1 tablet (0.4 mg total) under the tongue every 5 (five) minutes x 3 doses as needed for chest pain. 25 tablet 2   rosuvastatin (CRESTOR) 40 MG tablet TAKE 1 TABLET BY MOUTH EVERY DAY 90 tablet 3   No current facility-administered medications for this visit.    SURGICAL HISTORY:  Past Surgical History:  Procedure Laterality Date   CARDIAC CATHETERIZATION N/A 09/05/2015   Procedure: Left Heart Cath and Coronary Angiography;  Surgeon: Troy Sine, MD;  Location: [facility];  Service: Cardiovascular;  Laterality: N/A;   CARDIAC CATHETERIZATION N/A 09/05/2015   Procedure: Coronary Stent Intervention;  Surgeon: Troy Sine, MD;  Location: [facility];  Service: Cardiovascular;  Laterality: N/A;   IR IMAGING GUIDED PORT INSERTION  07/15/2019   ORIF CONGENITAL HIP DISLOCATION Left ~ 1965   "put pins in it"   PERIPHERAL VASCULAR CATHETERIZATION Bilateral 2003   "stenting"   TONSILLECTOMY  ~ [place]:  Constitutional: negative Eyes: negative Ears, nose, mouth, throat, and face: negative Respiratory: negative Cardiovascular: negative Gastrointestinal: negative Genitourinary:negative Integument/breast: negative Hematologic/lymphatic: negative Musculoskeletal:negative Neurological: negative Behavioral/Psych: negative Endocrine: negative Allergic/Immunologic: negative   PHYSICAL EXAMINATION: General appearance: alert, cooperative, appears stated age, fatigued, and no distress Head: Normocephalic, without obvious abnormality, atraumatic Neck: no adenopathy, no JVD, supple, symmetrical,  trachea midline, and thyroid not enlarged, symmetric, no tenderness/mass/nodules Lymph nodes: Cervical, supraclavicular, and axillary nodes normal. Resp: clear to auscultation bilaterally Back: symmetric, no curvature. ROM normal. No CVA tenderness. Cardio: regular rate and rhythm, S1, S2 normal, no murmur, click, rub or gallop GI: soft, non-tender; bowel sounds normal; no masses,  no organomegaly Extremities: extremities normal, atraumatic, no cyanosis or edema Neurologic: Alert and oriented X 3, normal strength and tone. Normal symmetric reflexes. Normal coordination and gait  ECOG PERFORMANCE STATUS: 1 - Symptomatic but completely ambulatory  Blood pressure (!) 154/66, pulse 86, temperature 97.7 F (36.5 C), temperature source Oral, resp. rate 16, weight 230 lb 11.2 oz (104.6 kg), SpO2 100 %.  LABORATORY DATA: Lab Results  Component Value Date   WBC 6.8 04/17/2022   HGB 11.1 (L) 04/17/2022   HCT 34.5 (L) 04/17/2022   MCV 88.0 04/17/2022   PLT 253 04/17/2022      Chemistry      Component Value Date/Time   NA 140 04/17/2022 1451   NA 140 06/08/2015 0840   K 4.2 04/17/2022 1451   CL 106 04/17/2022 1451   CO2 28 04/17/2022 1451   BUN 28 (H) 04/17/2022 1451   BUN 15  06/08/2015 0840   CREATININE 1.50 (H) 04/17/2022 1451   CREATININE 1.06 10/01/2016 0855      Component Value Date/Time   CALCIUM 10.1 04/17/2022 1451   ALKPHOS 75 04/17/2022 1451   AST 23 04/17/2022 1451   ALT 27 04/17/2022 1451   BILITOT 0.3 04/17/2022 1451       RADIOGRAPHIC STUDIES: CT Chest Wo Contrast  Result Date: 04/13/2022 CLINICAL DATA:  Non-small-cell lung cancer. Restaging. * Tracking Code: BO * EXAM: CT CHEST, ABDOMEN AND PELVIS WITHOUT CONTRAST TECHNIQUE: Multidetector CT imaging of the chest, abdomen and pelvis was performed following the standard protocol without IV contrast. RADIATION DOSE REDUCTION: This exam was performed according to the departmental dose-optimization program which  includes automated exposure control, adjustment of the mA and/or kV according to patient size and/or use of iterative reconstruction technique. COMPARISON:  01/15/2022 FINDINGS: CT CHEST FINDINGS Cardiovascular: The heart size is normal. No substantial pericardial effusion. Coronary artery calcification is evident. Moderate atherosclerotic calcification is noted in the wall of the thoracic aorta. Right Port-A-Cath tip is positioned in the distal SVC. Mediastinum/Nodes: No mediastinal lymphadenopathy. No evidence for gross hilar lymphadenopathy although assessment is limited by the lack of intravenous contrast on the current study. The esophagus has normal imaging features. There is no axillary lymphadenopathy. Lungs/Pleura: Centrilobular emphsyema noted. Small focus of chronic atelectasis or scarring in the right middle lobe is stable. No new suspicious pulmonary nodule or mass. Musculoskeletal: No worrisome lytic or sclerotic osseous abnormality. CT ABDOMEN PELVIS FINDINGS Hepatobiliary: 2.4 cm subcapsular lesion anterior right liver on 70/503 is stable. No new suspicious abnormality in the liver on this noncontrast study. There is no evidence for gallstones, gallbladder wall thickening, or pericholecystic fluid. No intrahepatic or extrahepatic biliary dilation. Pancreas: No focal mass lesion. No dilatation of the main duct. No intraparenchymal cyst. No peripancreatic edema. Spleen: No splenomegaly. No focal mass lesion. Adrenals/Urinary Tract: No adrenal nodule or mass. Kidneys unremarkable. No evidence for hydroureter. The urinary bladder appears normal for the degree of distention. Stomach/Bowel: Stomach is unremarkable. No gastric wall thickening. No evidence of outlet obstruction. Duodenum is normally positioned as is the ligament of Treitz. No small bowel wall thickening. No small bowel dilatation. The terminal ileum is normal. The appendix is normal. No gross colonic mass. No colonic wall thickening.  Vascular/Lymphatic: There is moderate atherosclerotic calcification of the abdominal aorta without aneurysm. Bilateral common iliac artery stents evident. Small gastrohepatic ligament lymph nodes are stable. No hepatoduodenal ligament or retroperitoneal lymphadenopathy. No pelvic sidewall lymphadenopathy. Reproductive: The prostate gland and seminal vesicles are unremarkable. Other: No intraperitoneal free fluid. Musculoskeletal: Status post pin placement left femoral neck. Degenerative changes noted in the right hip. No worrisome lytic or sclerotic osseous abnormality. IMPRESSION: 1. Stable exam. No new or progressive findings to suggest recurrent or metastatic disease in the chest, abdomen, or pelvis. 2. Stable 2.4 cm subcapsular lesion anterior right liver. This has been previously characterized as hemangioma. 3.  Emphysema (ICD10-J43.9) and Aortic Atherosclerosis (ICD10-170.0) Electronically Signed   By: Misty Stanley M.D.   On: 04/13/2022 15:08   CT Abdomen Pelvis Wo Contrast  Result Date: 04/13/2022 CLINICAL DATA:  Non-small-cell lung cancer. Restaging. * Tracking Code: BO * EXAM: CT CHEST, ABDOMEN AND PELVIS WITHOUT CONTRAST TECHNIQUE: Multidetector CT imaging of the chest, abdomen and pelvis was performed following the standard protocol without IV contrast. RADIATION DOSE REDUCTION: This exam was performed according to the departmental dose-optimization program which includes automated exposure control, adjustment of the mA and/or kV  according to patient size and/or use of iterative reconstruction technique. COMPARISON:  01/15/2022 FINDINGS: CT CHEST FINDINGS Cardiovascular: The heart size is normal. No substantial pericardial effusion. Coronary artery calcification is evident. Moderate atherosclerotic calcification is noted in the wall of the thoracic aorta. Right Port-A-Cath tip is positioned in the distal SVC. Mediastinum/Nodes: No mediastinal lymphadenopathy. No evidence for gross hilar  lymphadenopathy although assessment is limited by the lack of intravenous contrast on the current study. The esophagus has normal imaging features. There is no axillary lymphadenopathy. Lungs/Pleura: Centrilobular emphsyema noted. Small focus of chronic atelectasis or scarring in the right middle lobe is stable. No new suspicious pulmonary nodule or mass. Musculoskeletal: No worrisome lytic or sclerotic osseous abnormality. CT ABDOMEN PELVIS FINDINGS Hepatobiliary: 2.4 cm subcapsular lesion anterior right liver on 70/503 is stable. No new suspicious abnormality in the liver on this noncontrast study. There is no evidence for gallstones, gallbladder wall thickening, or pericholecystic fluid. No intrahepatic or extrahepatic biliary dilation. Pancreas: No focal mass lesion. No dilatation of the main duct. No intraparenchymal cyst. No peripancreatic edema. Spleen: No splenomegaly. No focal mass lesion. Adrenals/Urinary Tract: No adrenal nodule or mass. Kidneys unremarkable. No evidence for hydroureter. The urinary bladder appears normal for the degree of distention. Stomach/Bowel: Stomach is unremarkable. No gastric wall thickening. No evidence of outlet obstruction. Duodenum is normally positioned as is the ligament of Treitz. No small bowel wall thickening. No small bowel dilatation. The terminal ileum is normal. The appendix is normal. No gross colonic mass. No colonic wall thickening. Vascular/Lymphatic: There is moderate atherosclerotic calcification of the abdominal aorta without aneurysm. Bilateral common iliac artery stents evident. Small gastrohepatic ligament lymph nodes are stable. No hepatoduodenal ligament or retroperitoneal lymphadenopathy. No pelvic sidewall lymphadenopathy. Reproductive: The prostate gland and seminal vesicles are unremarkable. Other: No intraperitoneal free fluid. Musculoskeletal: Status post pin placement left femoral neck. Degenerative changes noted in the right hip. No worrisome  lytic or sclerotic osseous abnormality. IMPRESSION: 1. Stable exam. No new or progressive findings to suggest recurrent or metastatic disease in the chest, abdomen, or pelvis. 2. Stable 2.4 cm subcapsular lesion anterior right liver. This has been previously characterized as hemangioma. 3.  Emphysema (ICD10-J43.9) and Aortic Atherosclerosis (ICD10-170.0) Electronically Signed   By: Misty Stanley M.D.   On: 04/13/2022 15:08    ASSESSMENT AND PLAN: This is a very pleasant 66 years old African-American male recently diagnosed with a stage IV (TX, N3, M1c) non-small cell lung cancer, poorly differentiated adenocarcinoma presented with bulky mediastinal as well as right hilar, supraclavicular, abdominal and right axillary lymphadenopathy in addition to metastatic liver lesions diagnosed in January 2021. Molecular studies by [employer] shows no actionable mutations.  The patient is not a candidate for treatment with targeted therapy or enrollment in the clinical trial with the [facility] regimen. The patient is currently undergoing systemic chemotherapy with carboplatin for AUC of 5, Alimta 500 mg/M2 and Keytruda 200 mg IV every 3 weeks status post 35 cycles. Starting from cycle #5 he is treated with maintenance Alimta and Keytruda every 3 weeks.  Starting from cycle #12 the patient has been on treatment with single agent Keytruda because of the renal insufficiency. The patient completed 2 years of treatment with immunotherapy. The patient is currently on observation and he is feeling fine with no concerning complaints. He had repeat CT scan of the chest, abdomen and pelvis performed recently.  I personally and independently reviewed the scan and discussed the result with the patient today. His scan  showed no concerning findings for disease progression. I recommended for him to continue on observation with repeat CT scan of the chest, abdomen and pelvis in 3 months. For the anemia of chronic disease/iron  deficiency, the patient received treatment with iron infusion with Venofer recently and his hemoglobin and hematocrit had improved.  He will also continue on the oral iron tablets for now. For the renal insufficiency he is followed by nephrology. For the Port-A-Cath maintenance, he will have flush every 6 weeks. The patient was advised to call immediately if he has any other concerning symptoms in the interval. The patient voices understanding of current disease status and treatment options and is in agreement with the current care plan.  All questions were answered. The patient knows to call the clinic with any problems, questions or concerns. We can certainly see the patient much sooner if necessary.  Disclaimer: This note was dictated with voice recognition software. Similar sounding words can inadvertently be transcribed and may not be corrected upon review.

## 2022-04-19 ENCOUNTER — Other Ambulatory Visit (HOSPITAL_COMMUNITY): Payer: Medicare Other

## 2022-04-19 ENCOUNTER — Other Ambulatory Visit: Payer: Medicare Other

## 2022-04-23 ENCOUNTER — Ambulatory Visit: Payer: Medicare Other | Admitting: Internal Medicine

## 2022-05-02 ENCOUNTER — Ambulatory Visit (INDEPENDENT_AMBULATORY_CARE_PROVIDER_SITE_OTHER): Payer: Medicare Other | Admitting: Nurse Practitioner

## 2022-05-02 ENCOUNTER — Encounter: Payer: Self-pay | Admitting: Nurse Practitioner

## 2022-05-02 VITALS — BP 143/64 | HR 80 | Temp 98.2°F | Ht 75.0 in | Wt 233.0 lb

## 2022-05-02 DIAGNOSIS — J069 Acute upper respiratory infection, unspecified: Secondary | ICD-10-CM

## 2022-05-02 MED ORDER — AZITHROMYCIN 250 MG PO TABS: ORAL_TABLET | ORAL | 0 refills | Status: AC

## 2022-05-02 MED ORDER — BENZONATATE 200 MG PO CAPS: 200.0000 mg | ORAL_CAPSULE | Freq: Two times a day (BID) | ORAL | 0 refills | Status: DC | PRN

## 2022-05-02 MED ORDER — PREDNISONE 20 MG PO TABS: 20.0000 mg | ORAL_TABLET | Freq: Every day | ORAL | 0 refills | Status: AC

## 2022-05-02 NOTE — Patient Instructions (Addendum)
1. Upper respiratory tract infection, unspecified type  - azithromycin (ZITHROMAX) 250 MG tablet; Take 2 tablets on day 1, then 1 tablet daily on days 2 through 5  Dispense: 6 tablet; Refill: 0 - predniSONE (DELTASONE) 20 MG tablet; Take 1 tablet (20 mg total) by mouth daily with breakfast for 5 days.  Dispense: 5 tablet; Refill: 0 - benzonatate (TESSALON) 200 MG capsule; Take 1 capsule (200 mg total) by mouth 2 (two) times daily as needed for cough.  Dispense: 20 capsule; Refill: 0  Stay well hydrated  Stay active  Deep breathing exercises  May take tylenol or fever or pain  May take mucinex twice daily   Follow up:  Follow up as scheduled

## 2022-05-02 NOTE — Progress Notes (Signed)
$'@Patient'u$  ID: Kirk [name], male    DOB: 1956-04-28, 66 y.o.   MRN: [id number]  Chief Complaint  Patient presents with   Nasal Congestion    Pt stated--congestion, sore throat, coughing--3 weeks. Home test COVID --negative.    Referring provider: Fenton Foy, NP   HPI  Kirk [name] 66 y.o. male  has a past medical history of Arthritis, Atrial flutter with rapid ventricular response ([place]) (04/19/2019), Dizziness (10/2019), Dyspnea, Elevated serum creatinine (07/2020), HTN (hypertension), Hyperlipidemia, Lung cancer ([place]) (05/2019), Microalbuminuria (07/2020), NSTEMI (non-ST elevated myocardial infarction) ([place]) (09/04/2015), PAD (peripheral artery disease) ([place]), Refusal of blood transfusions as patient is Jehovah's Witness, Type II diabetes mellitus ([place]), and Vitamin D deficiency (07/2020).   Patient presents today for sick visit.  He states that for the past 3 weeks he has been having congestion, sore throat, coughing.  Patient did take a home COVID test which was negative. Denies f/c/s, n/v/d, hemoptysis, PND, leg swelling Denies chest pain or edema       No Known Allergies  Immunization History  Administered Date(s) Administered   Fluad Quad(high Dose 65+) 03/21/2020, 03/15/2021, 03/08/2022   Influenza, High Dose Seasonal PF 03/22/2019   PFIZER(Purple Top)SARS-COV-2 Vaccination 01/18/2020, 07/05/2020, 08/02/2020, 09/27/2020   Pneumococcal Conjugate-13 03/22/2019    Past Medical History:  Diagnosis Date   Arthritis    "right knee" (09/04/2015)   Atrial flutter with rapid ventricular response ([place]) 04/19/2019   Dizziness 10/2019   Dyspnea    increased exertion   Elevated serum creatinine 07/2020   HTN (hypertension)    Hyperlipidemia    Lung cancer ([place]) 05/2019   Microalbuminuria 07/2020   NSTEMI (non-ST elevated myocardial infarction) ([place]) 09/04/2015   a. cath 09/05/2015: 95% stenosis mid-LAD, 55% mid-RCA, and 40% prox-RCA. PCI performed w/ Synergy DES  to LAD   PAD (peripheral artery disease) ([facility])    Stenting of bilateral iliacs in 2003.   Refusal of blood transfusions as patient is Jehovah's Witness    Type II diabetes mellitus ([place])    Vitamin D deficiency 07/2020    Tobacco History: Social History   Tobacco Use  Smoking Status Former   Packs/day: 2.00   Years: 35.00   Total pack years: 70.00   Types: Cigarettes   Quit date: 02/19/2003   Years since quitting: 19.4  Smokeless Tobacco Never   Counseling given: Not Answered   Outpatient Encounter Medications as of 05/02/2022  Medication Sig   acetaminophen (TYLENOL) 500 MG tablet Take 500 mg by mouth every 6 (six) hours as needed for moderate pain or fever.    aspirin EC 81 MG tablet Take 81 mg by mouth at bedtime.   [EXPIRED] azithromycin (ZITHROMAX) 250 MG tablet Take 2 tablets on day 1, then 1 tablet daily on days 2 through 5   benzonatate (TESSALON) 200 MG capsule Take 1 capsule (200 mg total) by mouth 2 (two) times daily as needed for cough. (Patient not taking: Reported on 07/11/2022)   blood glucose meter kit and supplies Dispense based on patient and insurance preference. Use up to four times daily as directed. (FOR ICD-10 E10.9, E11.9).   carvedilol (COREG) 12.5 MG tablet TAKE 1 TABLET (12.5 MG TOTAL) BY MOUTH 2 (TWO) TIMES DAILY WITH A MEAL.   clopidogrel (PLAVIX) 75 MG tablet TAKE 1 TABLET BY MOUTH EVERY DAY   docusate sodium (COLACE) 100 MG capsule TAKE 1 CAPSULE (100 MG TOTAL) BY MOUTH 2 (TWO) TIMES DAILY AS NEEDED FOR MILD CONSTIPATION.  gabapentin (NEURONTIN) 300 MG capsule TAKE 1 CAPSULE BY MOUTH THREE TIMES A DAY   glucose blood (ACCU-CHEK GUIDE) test strip Use as instructed up to four times daily as directed.   metFORMIN (GLUCOPHAGE-XR) 500 MG 24 hr tablet Take 1 tablet (500 mg total) by mouth 2 (two) times daily with a meal.   Misc. Devices (ROLLATOR ULTRA-LIGHT) MISC 1 each by Does not apply route daily.   [EXPIRED] predniSONE (DELTASONE) 20 MG tablet Take 1  tablet (20 mg total) by mouth daily with breakfast for 5 days.   rosuvastatin (CRESTOR) 40 MG tablet TAKE 1 TABLET BY MOUTH EVERY DAY   [DISCONTINUED] glimepiride (AMARYL) 4 MG tablet TAKE 1 TABLET BY MOUTH EVERY DAY   ferrous sulfate 324 MG TBEC Take 1 tablet (324 mg total) by mouth daily with breakfast. (Patient not taking: Reported on 03/18/2022)   nitroGLYCERIN (NITROSTAT) 0.4 MG SL tablet Place 1 tablet (0.4 mg total) under the tongue every 5 (five) minutes x 3 doses as needed for chest pain. (Patient not taking: Reported on 07/11/2022)   [DISCONTINUED] lidocaine-prilocaine (EMLA) cream APPLY TO THE PORT-A-CATH SITE 30-60 MINUTES BEFORE TREATMENT. (Patient taking differently: Apply 1 application  topically daily as needed (access port). Apply to the Port-A-Cath site 30-60 minutes before treatment.)   [DISCONTINUED] multivitamin (ONE-A-DAY MEN'S) TABS tablet Take 1 tablet by mouth daily. (Patient not taking: Reported on 03/18/2022)   No facility-administered encounter medications on file as of 05/02/2022.     Review of Systems  Review of Systems  Constitutional: Negative.   HENT:  Positive for congestion, postnasal drip, sinus pressure, sinus pain and sore throat.   Respiratory:  Positive for cough.   Cardiovascular: Negative.   Gastrointestinal: Negative.   Allergic/Immunologic: Negative.   Neurological: Negative.   Psychiatric/Behavioral: Negative.         Physical Exam  BP (!) 143/64   Pulse 80   Temp 98.2 F (36.8 C)   Ht 6\' 3"  (1.905 m)   Wt 233 lb (105.7 kg)   SpO2 97%   BMI 29.12 kg/m   Wt Readings from Last 5 Encounters:  07/11/22 232 lb 3.2 oz (105.3 kg)  06/27/22 233 lb (105.7 kg)  05/02/22 233 lb (105.7 kg)  04/17/22 230 lb 11.2 oz (104.6 kg)  04/11/22 233 lb (105.7 kg)     Physical Exam Vitals and nursing note reviewed.  Constitutional:      General: He is not in acute distress.    Appearance: He is well-developed.  Cardiovascular:     Rate and  Rhythm: Normal rate and regular rhythm.  Pulmonary:     Effort: Pulmonary effort is normal.     Breath sounds: Normal breath sounds.  Skin:    General: Skin is warm and dry.  Neurological:     Mental Status: He is alert and oriented to person, place, and time.      Lab Results:  CBC    Component Value Date/Time   WBC 7.2 05/09/2022 1340   WBC 9.2 12/25/2019 1850   RBC 4.30 05/09/2022 1340   HGB 12.3 (L) 05/09/2022 1340   HCT 38.2 (L) 05/09/2022 1340   PLT 289 05/09/2022 1340   MCV 88.8 05/09/2022 1340   MCH 28.6 05/09/2022 1340   MCHC 32.2 05/09/2022 1340   RDW 17.0 (H) 05/09/2022 1340   LYMPHSABS 2.9 05/09/2022 1340   MONOABS 0.6 05/09/2022 1340   EOSABS 0.1 05/09/2022 1340   BASOSABS 0.1 05/09/2022 1340    BMET  Component Value Date/Time   NA 138 05/09/2022 1340   NA 140 06/08/2015 0840   K 4.2 05/09/2022 1340   CL 102 05/09/2022 1340   CO2 29 05/09/2022 1340   GLUCOSE 252 (H) 05/09/2022 1340   BUN 24 (H) 05/09/2022 1340   BUN 15 06/08/2015 0840   CREATININE 1.55 (H) 05/09/2022 1340   CREATININE 1.06 10/01/2016 0855   CALCIUM 10.1 05/09/2022 1340   GFRNONAA 48 (L) 05/09/2022 1340   GFRNONAA 75 10/01/2016 0855   GFRAA 58 (L) 02/10/2020 1120   GFRAA 87 10/01/2016 0855    BNP No results found for: "BNP"  ProBNP No results found for: "PROBNP"  Imaging: No results found.   Assessment & Plan:   Upper respiratory tract infection - azithromycin (ZITHROMAX) 250 MG tablet; Take 2 tablets on day 1, then 1 tablet daily on days 2 through 5  Dispense: 6 tablet; Refill: 0 - predniSONE (DELTASONE) 20 MG tablet; Take 1 tablet (20 mg total) by mouth daily with breakfast for 5 days.  Dispense: 5 tablet; Refill: 0 - benzonatate (TESSALON) 200 MG capsule; Take 1 capsule (200 mg total) by mouth 2 (two) times daily as needed for cough.  Dispense: 20 capsule; Refill: 0  Stay well hydrated  Stay active  Deep breathing exercises  May take tylenol or fever or  pain  May take mucinex twice daily   Follow up:  Follow up as scheduled     Fenton Foy, NP 07/11/2022

## 2022-05-03 ENCOUNTER — Ambulatory Visit: Payer: Self-pay | Admitting: Nurse Practitioner

## 2022-05-06 ENCOUNTER — Other Ambulatory Visit: Payer: Medicare Other | Admitting: Pharmacist

## 2022-05-06 ENCOUNTER — Encounter: Payer: Self-pay | Admitting: Pharmacist

## 2022-05-06 NOTE — Progress Notes (Signed)
05/06/2022 Name: Kirk [name] MRN: [id number] DOB: 1956-04-13  Chief Complaint  Patient presents with   Medication Management   Diabetes   Hypertension   Hyperlipidemia    Kirk [name] is a 66 y.o. year old male who presented for a telephone visit.   They were referred to the pharmacist by their PCP for assistance in managing diabetes and hypertension.    Subjective:  Care Team: Primary Care Provider: Fenton Foy, NP ; Next Scheduled Visit: needs to schedule  Medication Access/Adherence  Current Pharmacy:  [id number]: [phone]: [address] [address]Phone: [phone]: [phone]  Patient reports affordability concerns with their medications: No  Patient reports access/transportation concerns to their pharmacy: No  Patient reports adherence concerns with their medications:  No     Diabetes:  Current medications: metformin XR 500 mg twice daily, glimepiride 4 mg daily  Patient has a high deductible that he would need to meet before filling SGLT2 or GLP1. He elected to wait until the start of the calendar year to add new medications  Current glucose readings: reports he has not been checking lately.   Patient denies hypoglycemic s/sx including dizziness, shakiness, sweating.   Hypertension:  Current medications: carvedilol 12.5 mg twice daily Medications previously tried: lisinopril 5 mg - started by nephrology in 10/2020, developed AKI, resolved with discontinuation  Patient has ab automated, upper arm home BP cuff Current blood pressure readings readings: at office visits 140-150s/80s  Patient denies hypotensive s/sx including dizziness, lightheadedness.   Hyperlipidemia/ASCVD Risk Reduction  Current lipid lowering medications:  rosuvastatin 40 mg daily  Antiplatelet regimen: clopidogrel 75 mg daily, aspirin 81 mg daily  Health Maintenance  Health Maintenance Due  Topic Date Due   DTaP/Tdap/Td (1 - Tdap) Never done   Zoster Vaccines- Shingrix (1 of 2) Never done   COVID-19 Vaccine (5 - 2023-24 season) 01/25/2022   Medicare Annual Wellness (AWV)  02/15/2022   FOOT EXAM  02/28/2022     Objective: Lab Results  Component Value Date   HGBA1C 8.1 (A) 03/08/2022    Lab Results  Component Value Date   CREATININE 1.50 (H) 04/17/2022   BUN 28 (H) 04/17/2022   NA 140 04/17/2022   K 4.2 04/17/2022   CL 106 04/17/2022   CO2 28 04/17/2022    Lab Results  Component Value Date   CHOL 117 06/28/2020   HDL 35 (L) 06/28/2020   LDLCALC 59 06/28/2020   TRIG 132 06/28/2020   CHOLHDL 3.3 06/28/2020    Medications Reviewed Today     Reviewed by HOsker Mason RPH-CPP (Pharmacist) on 05/06/22 at [phone] Med List Status: <None>   Medication Order Taking? Sig Documenting Provider Last Dose Status Informant  acetaminophen (TYLENOL) 500 MG tablet [id number] Take 500 mg by mouth every 6 (six) hours as needed for moderate pain or fever.  [provider] Taking Active Self  aspirin EC 81 MG tablet [id number] Take 81 mg by mouth at bedtime. [provider] Taking Active Self  azithromycin (ZITHROMAX) 250 MG tablet [id number] Take 2 tablets on day 1, then 1 tablet daily on days 2 through 5 NFenton Foy NP Taking Active   benzonatate (TESSALON) 200 MG capsule [id number] Take 1 capsule (200 mg total) by  mouth 2 (two) times daily as needed for cough. Fenton Foy, NP Taking Active   blood glucose meter kit and supplies [id number] Yes Dispense based on patient and insurance preference. Use up to four times daily as directed. (FOR ICD-10 E10.9, E11.9). [name], NP Taking Active   carvedilol (COREG) 12.5 MG tablet [id number] Yes TAKE 1 TABLET (12.5 MG TOTAL) BY MOUTH 2 (TWO) TIMES  DAILY WITH A MEAL. Fenton Foy, NP Taking Active   clopidogrel (PLAVIX) 75 MG tablet [id number] Yes TAKE 1 TABLET BY MOUTH EVERY DAY Fenton Foy, NP Taking Active   docusate sodium (COLACE) 100 MG capsule [id number]  TAKE 1 CAPSULE (100 MG TOTAL) BY MOUTH 2 (TWO) TIMES DAILY AS NEEDED FOR MILD CONSTIPATION. Fenton Foy, NP  Active   ferrous sulfate 324 MG TBEC [id number] No Take 1 tablet (324 mg total) by mouth daily with breakfast.  Patient not taking: Reported on 03/18/2022   [name], FNP Not Taking Active   gabapentin (NEURONTIN) 300 MG capsule [id number] Yes TAKE 1 CAPSULE BY MOUTH THREE TIMES A DAY Fenton Foy, NP Taking Active   glimepiride (AMARYL) 4 MG tablet [id number] Yes TAKE 1 TABLET BY MOUTH EVERY DAY Fenton Foy, NP Taking Active   glucose blood (ACCU-CHEK GUIDE) test strip [id number]  Use as instructed up to four times daily as directed. [name], NP  Active   metFORMIN (GLUCOPHAGE-XR) 500 MG 24 hr tablet [id number] Yes Take 1 tablet (500 mg total) by mouth 2 (two) times daily with a meal. Fenton Foy, NP Taking Active   Misc. Devices (ROLLATOR [place]) [place] [id number] Yes 1 each by Does not apply route daily. Fenton Foy, NP Taking Active   nitroGLYCERIN (NITROSTAT) 0.4 MG SL tablet [id number]  Place 1 tablet (0.4 mg total) under the tongue every 5 (five) minutes x 3 doses as needed for chest pain. Imogene Burn, PA-C  Active            Med Note [name]   Mon Aug 09, 2019  4:23 PM) As needed  predniSONE (DELTASONE) 20 MG tablet [id number] Yes Take 1 tablet (20 mg total) by mouth daily with breakfast for 5 days. Fenton Foy, NP Taking Active   rosuvastatin (CRESTOR) 40 MG tablet [id number] Yes TAKE 1 TABLET BY MOUTH EVERY DAY Fenton Foy, NP Taking Active               Assessment/Plan:   Diabetes: - Currently uncontrolled - Reviewed goal A1c, goal fasting, and goal 2 hour post prandial glucose -  Recommend to continue current regimen. Plan to start SGLT2 with beginning of the year due to copay.  - Recommend to check glucose twice daily - fasting and 2 hour post prandial  Hypertension: - Currently uncontrolled - Reviewed long term cardiovascular and renal outcomes of uncontrolled blood pressure - Reviewed appropriate blood pressure monitoring technique and reviewed goal blood pressure. Recommended to check home blood pressure and heart rate daily and report results to me via MyChart next wee - Recommend to continue current regimen. Consider need for addition of low dose amlodipine if pressure remains elevated; though anticipating some benefit on BP from SGLT2  Hyperlipidemia/ASCVD Risk Reduction: - Currently controlled.  - Recommend to continue current regimen    Follow Up Plan: phone call in ~ 4 weeks  [name], PharmD, [facility] [phone]

## 2022-05-06 NOTE — Patient Instructions (Addendum)
Kirk [name],   It was great talking to you today!  Please call the office and schedule follow up with Kirk [name] in January.   Check your blood sugars twice daily:  1) Fasting, first thing in the morning before breakfast and  2) 2 hours after your largest meal.   For a goal A1c of less than 7%, goal fasting readings are less than 130 and goal 2 hour after meal readings are less than 180.    Check your blood pressure once daily, and any time you have concerning symptoms like headache, chest pain, dizziness, shortness of breath, or vision changes.   Our goal is less than 130/80.  To appropriately check your blood pressure, make sure you do the following:  1) Avoid caffeine, exercise, or tobacco products for 30 minutes before checking. Empty your bladder. 2) Sit with your back supported in a flat-backed chair. Rest your arm on something flat (arm of the chair, table, etc). 3) Sit still with your feet flat on the floor, resting, for at least 5 minutes.  4) Check your blood pressure. Take 1-2 readings.  5) Write down these readings and bring with you to any provider appointments.  Bring your home blood pressure machine with you to a provider's office for accuracy comparison at least once a year.   Make sure you take your blood pressure medications before you come to any office visit, even if you were asked to fast for labs.   Take care!  [name], PharmD, [facility] [phone]

## 2022-05-08 ENCOUNTER — Other Ambulatory Visit: Payer: Self-pay | Admitting: Medical Oncology

## 2022-05-08 DIAGNOSIS — D509 Iron deficiency anemia, unspecified: Secondary | ICD-10-CM

## 2022-05-09 ENCOUNTER — Other Ambulatory Visit: Payer: Self-pay

## 2022-05-09 ENCOUNTER — Inpatient Hospital Stay: Payer: Medicare Other | Attending: Internal Medicine

## 2022-05-09 DIAGNOSIS — D509 Iron deficiency anemia, unspecified: Secondary | ICD-10-CM

## 2022-05-09 DIAGNOSIS — Z95828 Presence of other vascular implants and grafts: Secondary | ICD-10-CM

## 2022-05-09 DIAGNOSIS — C3491 Malignant neoplasm of unspecified part of right bronchus or lung: Secondary | ICD-10-CM | POA: Insufficient documentation

## 2022-05-09 LAB — CBC WITH DIFFERENTIAL (CANCER CENTER ONLY)
Abs Immature Granulocytes: 0.01 10*3/uL (ref 0.00–0.07)
Basophils Absolute: 0.1 10*3/uL (ref 0.0–0.1)
Basophils Relative: 1 %
Eosinophils Absolute: 0.1 10*3/uL (ref 0.0–0.5)
Eosinophils Relative: 2 %
HCT: 38.2 % — ABNORMAL LOW (ref 39.0–52.0)
Hemoglobin: 12.3 g/dL — ABNORMAL LOW (ref 13.0–17.0)
Immature Granulocytes: 0 %
Lymphocytes Relative: 41 %
Lymphs Abs: 2.9 10*3/uL (ref 0.7–4.0)
MCH: 28.6 pg (ref 26.0–34.0)
MCHC: 32.2 g/dL (ref 30.0–36.0)
MCV: 88.8 fL (ref 80.0–100.0)
Monocytes Absolute: 0.6 10*3/uL (ref 0.1–1.0)
Monocytes Relative: 8 %
Neutro Abs: 3.5 10*3/uL (ref 1.7–7.7)
Neutrophils Relative %: 48 %
Platelet Count: 289 10*3/uL (ref 150–400)
RBC: 4.3 MIL/uL (ref 4.22–5.81)
RDW: 17 % — ABNORMAL HIGH (ref 11.5–15.5)
WBC Count: 7.2 10*3/uL (ref 4.0–10.5)
nRBC: 0 % (ref 0.0–0.2)

## 2022-05-09 LAB — CMP (CANCER CENTER ONLY)
ALT: 26 U/L (ref 0–44)
AST: 17 U/L (ref 15–41)
Albumin: 4.2 g/dL (ref 3.5–5.0)
Alkaline Phosphatase: 70 U/L (ref 38–126)
Anion gap: 7 (ref 5–15)
BUN: 24 mg/dL — ABNORMAL HIGH (ref 8–23)
CO2: 29 mmol/L (ref 22–32)
Calcium: 10.1 mg/dL (ref 8.9–10.3)
Chloride: 102 mmol/L (ref 98–111)
Creatinine: 1.55 mg/dL — ABNORMAL HIGH (ref 0.61–1.24)
GFR, Estimated: 48 mL/min — ABNORMAL LOW (ref >60)
Glucose, Bld: 252 mg/dL — ABNORMAL HIGH (ref 70–99)
Potassium: 4.2 mmol/L (ref 3.5–5.1)
Sodium: 138 mmol/L (ref 135–145)
Total Bilirubin: 0.3 mg/dL (ref 0.3–1.2)
Total Protein: 7.3 g/dL (ref 6.5–8.1)

## 2022-05-09 MED ORDER — HEPARIN SOD (PORK) LOCK FLUSH 100 UNIT/ML IV SOLN
500.0000 [IU] | INTRAVENOUS | Status: AC | PRN
  Administered 2022-05-09: 500 [IU]

## 2022-05-09 MED ORDER — SODIUM CHLORIDE 0.9% FLUSH
10.0000 mL | Freq: Once | INTRAVENOUS | Status: AC
  Administered 2022-05-09: 10 mL

## 2022-05-28 ENCOUNTER — Telehealth: Payer: Self-pay | Admitting: Nurse Practitioner

## 2022-05-28 NOTE — Telephone Encounter (Signed)
Left message for patient to call back and schedule Medicare Annual Wellness Visit (AWV).   Please offer to do virtually or by telephone.   Last AWV:  02/15/2021   Schedule at any time with [facility]   30 minute appointment for Virtual or phone  Any questions, please contact me at [phone]   Thank you,   [facility]  Ambulatory Clinical Support for Care Management [place] Are. We Are. [facility] ??[phone] or ??[phone]

## 2022-05-30 ENCOUNTER — Other Ambulatory Visit: Payer: Medicare Other | Admitting: Pharmacist

## 2022-06-02 ENCOUNTER — Telehealth: Payer: Medicare Other | Admitting: Family

## 2022-06-02 DIAGNOSIS — J019 Acute sinusitis, unspecified: Secondary | ICD-10-CM

## 2022-06-02 MED ORDER — AMOXICILLIN-POT CLAVULANATE 875-125 MG PO TABS: 1.0000 | ORAL_TABLET | Freq: Two times a day (BID) | ORAL | 0 refills | Status: DC

## 2022-06-02 NOTE — Progress Notes (Signed)
Virtual Visit Consent   Kirk [name], you are scheduled for a virtual visit with a [facility] provider today. Just as with appointments in the office, your consent must be obtained to participate. Your consent will be active for this visit and any virtual visit you may have with one of our providers in the next 365 days. If you have a MyChart account, a copy of this consent can be sent to you electronically.  As this is a virtual visit, video technology does not allow for your provider to perform a traditional examination. This may limit your provider's ability to fully assess your condition. If your provider identifies any concerns that need to be evaluated in person or the need to arrange testing (such as labs, EKG, etc.), we will make arrangements to do so. Although advances in technology are sophisticated, we cannot ensure that it will always work on either your end or our end. If the connection with a video visit is poor, the visit may have to be switched to a telephone visit. With either a video or telephone visit, we are not always able to ensure that we have a secure connection.  By engaging in this virtual visit, you consent to the provision of healthcare and authorize for your insurance to be billed (if applicable) for the services provided during this visit. Depending on your insurance coverage, you may receive a charge related to this service.  I need to obtain your verbal consent now. Are you willing to proceed with your visit today? Kirk [name] has provided verbal consent on 06/02/2022 for a virtual visit (video or telephone). Evelina Dun, FNP  Date: 06/02/2022 7:33 AM  Virtual Visit via Video Note   I, Evelina Dun, connected with  Kirk [name]  ([id number], [birth year]) on 06/02/22 at  7:45 AM EST by a video-enabled telemedicine application and verified that I am speaking with the correct person using two identifiers.  Location: Patient: Virtual Visit Location Patient:  Home Provider: Virtual Visit Location Provider: Home Office   I discussed the limitations of evaluation and management by telemedicine and the availability of in person appointments. The patient expressed understanding and agreed to proceed.    History of Present Illness: Kirk [name] is a 67 y.o. who identifies as a male who was assigned male at birth, and is being seen today for sinus congestion for over a month. Reports he went to the Urgent Care 05/02/22 and was given Zpak, prednisone, and tessalon. Report he was feeling slightly better, but after a week his symptoms return.   HPI: Sinus Problem This is a new problem. The current episode started 1 to 4 weeks ago. The problem has been gradually worsening since onset. There has been no fever. His pain is at a severity of 5/10. The pain is mild. Associated symptoms include congestion, coughing, ear pain, headaches, sinus pressure, sneezing and a sore throat. Pertinent negatives include no hoarse voice or shortness of breath. Past treatments include antibiotics and oral decongestants. The treatment provided mild relief.    Problems:  Patient Active Problem List   Diagnosis Date Noted   Iron deficiency anemia 03/28/2022   Need for immunization against influenza 03/08/2022   Chemotherapy-induced neuropathy ([place]) 05/31/2021   Drug-induced neutropenia ([place]) 12/26/2020   Port-A-Cath in place 12/29/2019   Metastatic non-small cell lung cancer ([place]) 07/07/2019   Encounter for antineoplastic chemotherapy 07/07/2019   Encounter for antineoplastic immunotherapy 07/07/2019   Goals of care, counseling/discussion 06/28/2019   Adenocarcinoma  of right lung, stage 4 ([place]) 06/28/2019   Cancer associated pain 06/28/2019   CAD (coronary artery disease) 06/08/2019   Mediastinal lymphadenopathy 06/03/2019   Liver metastasis 06/03/2019   Hemoglobin A1C between 7% and 9% indicating borderline diabetic control ([place]) 05/10/2019   COVID-19 virus infection  05/10/2019   Atrial flutter with rapid ventricular response ([place]) 04/19/2019   Chest pain with moderate risk for cardiac etiology 04/19/2019   Paroxysmal atrial flutter ([place]) 04/19/2019   Chest congestion 07/10/2018   Cough 07/10/2018   NSTEMI (non-ST elevated myocardial infarction) ([place]) 09/04/2015   PVD (peripheral vascular disease) ([place]) 06/08/2015   DDD (degenerative disc disease), cervical 06/08/2015   Hyperlipidemia 06/08/2015   Diabetes mellitus with nephropathy ([place]) 06/08/2015   Diabetes mellitus with peripheral vascular disease ([place]) 06/08/2015   Annual physical exam 01/03/2015   PAD (peripheral artery disease) ([place]) 02/18/2013   Diabetes ([place]) 02/18/2013   HTN (hypertension) 02/18/2013    Allergies: No Known Allergies Medications:  Current Outpatient Medications:    amoxicillin-clavulanate (AUGMENTIN) 875-125 MG tablet, Take 1 tablet by mouth 2 (two) times daily., Disp: 14 tablet, Rfl: 0   acetaminophen (TYLENOL) 500 MG tablet, Take 500 mg by mouth every 6 (six) hours as needed for moderate pain or fever. , Disp: , Rfl:    aspirin EC 81 MG tablet, Take 81 mg by mouth at bedtime., Disp: , Rfl:    benzonatate (TESSALON) 200 MG capsule, Take 1 capsule (200 mg total) by mouth 2 (two) times daily as needed for cough., Disp: 20 capsule, Rfl: 0   blood glucose meter kit and supplies, Dispense based on patient and insurance preference. Use up to four times daily as directed. (FOR ICD-10 E10.9, E11.9)., Disp: 1 each, Rfl: 0   carvedilol (COREG) 12.5 MG tablet, TAKE 1 TABLET (12.5 MG TOTAL) BY MOUTH 2 (TWO) TIMES DAILY WITH A MEAL., Disp: 180 tablet, Rfl: 3   clopidogrel (PLAVIX) 75 MG tablet, TAKE 1 TABLET BY MOUTH EVERY DAY, Disp: 90 tablet, Rfl: 3   docusate sodium (COLACE) 100 MG capsule, TAKE 1 CAPSULE (100 MG TOTAL) BY MOUTH 2 (TWO) TIMES DAILY AS NEEDED FOR MILD CONSTIPATION., Disp: 180 capsule, Rfl: 3   ferrous sulfate 324 MG TBEC, Take 1 tablet (324 mg total) by mouth daily with  breakfast. (Patient not taking: Reported on 03/18/2022), Disp: 30 tablet, Rfl: 11   gabapentin (NEURONTIN) 300 MG capsule, TAKE 1 CAPSULE BY MOUTH THREE TIMES A DAY, Disp: 270 capsule, Rfl: 3   glimepiride (AMARYL) 4 MG tablet, TAKE 1 TABLET BY MOUTH EVERY DAY, Disp: 90 tablet, Rfl: 3   glucose blood (ACCU-CHEK GUIDE) test strip, Use as instructed up to four times daily as directed., Disp: 100 each, Rfl: 12   metFORMIN (GLUCOPHAGE-XR) 500 MG 24 hr tablet, Take 1 tablet (500 mg total) by mouth 2 (two) times daily with a meal., Disp: 180 tablet, Rfl: 3   Misc. Devices (ROLLATOR ULTRA-LIGHT) MISC, 1 each by Does not apply route daily., Disp: 1 each, Rfl: 0   nitroGLYCERIN (NITROSTAT) 0.4 MG SL tablet, Place 1 tablet (0.4 mg total) under the tongue every 5 (five) minutes x 3 doses as needed for chest pain., Disp: 25 tablet, Rfl: 2   rosuvastatin (CRESTOR) 40 MG tablet, TAKE 1 TABLET BY MOUTH EVERY DAY, Disp: 90 tablet, Rfl: 3  Observations/Objective: Patient is well-developed, well-nourished in no acute distress.  Resting comfortably  at home.  Head is normocephalic, atraumatic.  No labored breathing.  Speech is clear and  coherent with logical content.  Patient is alert and oriented at baseline.  Nasal congestion   Assessment and Plan: 1. Acute sinusitis, recurrence not specified, unspecified location  - Take meds as prescribed - Use a cool mist humidifier  -Use saline nose sprays frequently -Force fluids -For any cough or congestion  Use plain Mucinex- regular strength or max strength is fine -For fever or aces or pains- take tylenol or ibuprofen. -Throat lozenges if help -Follow up if symptoms worsen or do not improve   Follow Up Instructions: I discussed the assessment and treatment plan with the patient. The patient was provided an opportunity to ask questions and all were answered. The patient agreed with the plan and demonstrated an understanding of the instructions.  A copy of  instructions were sent to the patient via MyChart unless otherwise noted below.   Patient has requested to receive PHI (AVS, Work Notes, etc) pertaining to this video visit through e-mail as they are currently without active [facility]. They have voiced understand that email is not considered secure and their health information could be viewed by someone other than the patient.   The patient was advised to call back or seek an in-person evaluation if the symptoms worsen or if the condition fails to improve as anticipated.  Time:  I spent 9 minutes with the patient via telehealth technology discussing the above problems/concerns.    Evelina Dun, FNP

## 2022-06-03 ENCOUNTER — Telehealth: Payer: Self-pay | Admitting: Medical Oncology

## 2022-06-03 NOTE — Telephone Encounter (Addendum)
UR symptoms. - Sinus , cough,chest congestion symptoms.  "Third antibiotic"  started yesterday for symptoms ( telehealth). I told him to contact his provider if symptoms do not resolve. Also reports new mid back pain.  Next appt with Oncology is 02/20 with scan.  Vaccines- I told pt  he needs to get the RSV and COVID at [facility].

## 2022-06-03 NOTE — Telephone Encounter (Signed)
Pt notified to call back after he complete the antibiotics and Dr. [name] will move his scan sooner if he continues to have symptoms.

## 2022-06-06 ENCOUNTER — Other Ambulatory Visit: Payer: Medicare Other | Admitting: Pharmacist

## 2022-06-06 NOTE — Patient Instructions (Addendum)
Kirk [name],   It was great talking with you today!  Start Jardiance 10 mg daily. Continue metformin XR 500 mg twice daily. Decrease glimepiride to 2 mg daily.   Check your blood sugars twice daily:  1) Fasting, first thing in the morning before breakfast and  2) 2 hours after your largest meal.   For a goal A1c of less than 7%, goal fasting readings are less than 130 and goal 2 hour after meal readings are less than 180.    Focus on staying well hydrated with Jardiance. Monitor for any lightheadedness or dizziness that could be signs of dehydration or low blood pressure.  Check your blood pressure periodically, and any time you have concerning symptoms like headache, chest pain, dizziness, shortness of breath, or vision changes.   Our goal is less than 130/80.  To appropriately check your blood pressure, make sure you do the following:  1) Avoid caffeine, exercise, or tobacco products for 30 minutes before checking. Empty your bladder. 2) Sit with your back supported in a flat-backed chair. Rest your arm on something flat (arm of the chair, table, etc). 3) Sit still with your feet flat on the floor, resting, for at least 5 minutes.  4) Check your blood pressure. Take 1-2 readings.  5) Write down these readings and bring with you to any provider appointments.  Bring your home blood pressure machine with you to a provider's office for accuracy comparison at least once a year.   Make sure you take your blood pressure medications before you come to any office visit, even if you were asked to fast for labs.  Take care!  [name], PharmD, [facility], [facility] [phone]

## 2022-06-06 NOTE — Progress Notes (Signed)
06/06/2022 Name: Kirk [name] MRN: [id number] DOB: [birth year]  Chief Complaint  Patient presents with   Medication Management    Kirk [name] is a 67 y.o. year old male who presented for a telephone visit.   They were referred to the pharmacist by their PCP for assistance in managing diabetes.    Subjective:  Care Team: Primary Care Provider: Ivonne Andrew, NP ; Next Scheduled Visit: needs to schedule  Medication Access/Adherence  Current Pharmacy:  [id number] [place], [place] - [address] [place] [address] Phone: [phone] Fax: [phone]  [facility] - [place], [place] - [address] [place] [address] Phone: [phone] Fax: [phone]   Patient reports affordability concerns with their medications: No  Patient reports access/transportation concerns to their pharmacy: No  Patient reports adherence concerns with their medications:  No     Diabetes:  Current medications: metformin XR 500 mg twice daily; glimepiride 4 mg QPM  Current glucose readings: fasting 130-140s; 2 hour post prandial: 180-220s  Previously discussed addition of SGLT2, though patient preferred to wait until the start of the year due to having a deductible to meet.   Patient denies hypoglycemic s/sx including dizziness, shakiness, sweating. Patient denies hyperglycemic symptoms including polyuria, polydipsia, polyphagia, nocturia, neuropathy, blurred vision.  Hypertension:   Current medications: carvedilol 12.5 mg twice daily Medications previously tried: lisinopril 5 mg - started by nephrology in 10/2020, developed AKI, resolved with discontinuation   Patient has an automated, upper arm home BP cuff, has not been checking lately  Objective: Lab Results  Component Value Date   HGBA1C 8.1 (A) 03/08/2022    Lab Results  Component Value Date   CREATININE 1.55 (H) 05/09/2022   BUN 24  (H) 05/09/2022   NA 138 05/09/2022   K 4.2 05/09/2022   CL 102 05/09/2022   CO2 29 05/09/2022    Lab Results  Component Value Date   CHOL 117 06/28/2020   HDL 35 (L) 06/28/2020   LDLCALC 59 06/28/2020   TRIG 132 06/28/2020   CHOLHDL 3.3 06/28/2020    Medications Reviewed Today     Reviewed by Alden Hipp, RPH-CPP (Pharmacist) on 06/06/22 at 1404  Med List Status: <None>   Medication Order Taking? Sig Documenting Provider Last Dose Status Informant  acetaminophen (TYLENOL) 500 MG tablet [id number]  Take 500 mg by mouth every 6 (six) hours as needed for moderate pain or fever.  [provider]  Active Self  amoxicillin-clavulanate (AUGMENTIN) 875-125 MG tablet [id number]  Take 1 tablet by mouth 2 (two) times daily. [name], FNP  Active   aspirin EC 81 MG tablet [id number]  Take 81 mg by mouth at bedtime. [provider]  Active Self  benzonatate (TESSALON) 200 MG capsule [id number]  Take 1 capsule (200 mg total) by mouth 2 (two) times daily as needed for cough. Ivonne Andrew, NP  Active   blood glucose meter kit and supplies [id number]  Dispense based on patient and insurance preference. Use up to four times daily as directed. (FOR ICD-10 E10.9, E11.9). Barbette Merino, NP  Active   carvedilol (COREG) 12.5 MG tablet [id number]  TAKE 1 TABLET (12.5 MG TOTAL) BY MOUTH 2 (TWO) TIMES DAILY WITH A MEAL. Ivonne Andrew, NP  Active   clopidogrel (PLAVIX) 75 MG tablet [id number]  TAKE 1 TABLET BY MOUTH EVERY DAY Ivonne Andrew, NP  Active  docusate sodium (COLACE) 100 MG capsule [id number]  TAKE 1 CAPSULE (100 MG TOTAL) BY MOUTH 2 (TWO) TIMES DAILY AS NEEDED FOR MILD CONSTIPATION. Ivonne Andrew, NP  Active   ferrous sulfate 324 MG TBEC [id number]  Take 1 tablet (324 mg total) by mouth daily with breakfast.  Patient not taking: Reported on 03/18/2022   Kallie Locks, FNP  Active   gabapentin (NEURONTIN) 300 MG capsule [id number]  TAKE 1 CAPSULE BY MOUTH  THREE TIMES A DAY Ivonne Andrew, NP  Active   glimepiride (AMARYL) 4 MG tablet [id number] Yes TAKE 1 TABLET BY MOUTH EVERY DAY Ivonne Andrew, NP Taking Active   glucose blood (ACCU-CHEK GUIDE) test strip [id number]  Use as instructed up to four times daily as directed. Barbette Merino, NP  Active   metFORMIN (GLUCOPHAGE-XR) 500 MG 24 hr tablet [id number] Yes Take 1 tablet (500 mg total) by mouth 2 (two) times daily with a meal. Ivonne Andrew, NP Taking Active   Misc. Devices (ROLLATOR [place]) MISC [id number]  1 each by Does not apply route daily. Ivonne Andrew, NP  Active   nitroGLYCERIN (NITROSTAT) 0.4 MG SL tablet [id number]  Place 1 tablet (0.4 mg total) under the tongue every 5 (five) minutes x 3 doses as needed for chest pain. Dyann Kief, PA-C  Active            Med Note Kallie Locks   Mon Aug 09, 2019  4:23 PM) As needed  rosuvastatin (CRESTOR) 40 MG tablet [id number]  TAKE 1 TABLET BY MOUTH EVERY DAY Ivonne Andrew, NP  Active               Assessment/Plan:   Diabetes: - Currently uncontrolled - Reviewed long term cardiovascular and renal outcomes of uncontrolled blood sugar - Reviewed goal A1c, goal fasting, and goal 2 hour post prandial glucose - Reviewed SGLT2. Discussed risk of dehydration; encouraged hydration. Monitor for hypotensive symptoms - Recommend to start Jardiance 10 mg daily as previously discussed; recommend to reduce glimepiride to 2 mg daily to reduce risk of overnight hypoglycemia. Continue metformin XR 500 mg twice daily. Will collaborate with PCP to place order if in agreement.  - Recommend to check glucose twice daily, fasting and 2 hour post prandial.   Hypertension: - Currently controlled - Reviewed long term cardiovascular and renal outcomes of uncontrolled blood pressure - Reviewed appropriate blood pressure monitoring technique and reviewed goal blood pressure. Recommended to check home blood pressure and heart rate  periodically, especially with addition of SGLT2 - Recommend to continue current regimen at this time    Follow Up Plan: phone call in 4 weeks  Catie Eppie Gibson, PharmD, BCACP, CPP [facility] [phone]

## 2022-06-10 MED ORDER — GLIMEPIRIDE 2 MG PO TABS: 2.0000 mg | ORAL_TABLET | Freq: Every day | ORAL | 2 refills | Status: DC

## 2022-06-10 MED ORDER — EMPAGLIFLOZIN 10 MG PO TABS: 10.0000 mg | ORAL_TABLET | Freq: Every day | ORAL | 2 refills | Status: DC

## 2022-06-11 ENCOUNTER — Telehealth: Payer: Self-pay

## 2022-06-11 ENCOUNTER — Other Ambulatory Visit: Payer: Self-pay | Admitting: Pharmacist

## 2022-06-11 NOTE — Progress Notes (Signed)
Care Coordination Call  Contacted patient. Explained that there was a concern with amputations with Invokana, but there was not a similar incidence seen in clinical trials with Jardiance. Discussed that SGLT2s do not decrease circulation to the lower extremities. Reiterated positive cardiovascular/cardiorenal benefit. Discussed most common side effects related to dehydration, encouraged to stay well hydrated. Patient verbalized understanding.   Catie Eppie Gibson, PharmD, BCACP, CPP [facility] [phone]

## 2022-06-11 NOTE — Telephone Encounter (Signed)
Called patient back. See Patient Outreach encounter.   Catie Eppie Gibson, PharmD, BCACP, CPP [facility] [phone]

## 2022-06-11 NOTE — Telephone Encounter (Signed)
Patient picked up jardiance yesterday but the pharmacist told him to watch out for sores on his feet as this medication decreases circulation in your legs. He states that he already has very bad circulation in his legs and stints in both legs. He wants to know if this is safe for him to take as he already has issues with circulation. Please advise. He will not take it until he hears back.  He had to pay $513 to get the medication.

## 2022-06-14 ENCOUNTER — Telehealth: Payer: Self-pay | Admitting: Internal Medicine

## 2022-06-14 NOTE — Telephone Encounter (Signed)
Called patient regarding upcoming February appointments, patient is notified.

## 2022-06-20 ENCOUNTER — Inpatient Hospital Stay: Payer: Medicare Other

## 2022-06-27 ENCOUNTER — Ambulatory Visit: Payer: Medicare Other

## 2022-06-27 ENCOUNTER — Encounter: Payer: Self-pay | Admitting: Gastroenterology

## 2022-06-27 VITALS — Ht 75.0 in | Wt 233.0 lb

## 2022-06-27 DIAGNOSIS — Z1211 Encounter for screening for malignant neoplasm of colon: Secondary | ICD-10-CM

## 2022-06-27 DIAGNOSIS — Z0000 Encounter for general adult medical examination without abnormal findings: Secondary | ICD-10-CM | POA: Diagnosis not present

## 2022-06-27 NOTE — Patient Instructions (Addendum)
Mr. Kirk [name] , Thank you for taking time to come for your Medicare Wellness Visit. I appreciate your ongoing commitment to your health goals. Please review the following plan we discussed and let me know if I can assist you in the future.   These are the goals we discussed:  Goals       Stay Healthy (pt-stated)      Stay Alive        This is a list of the screening recommended for you and due dates:  Health Maintenance  Topic Date Due   DTaP/Tdap/Td vaccine (1 - Tdap) Never done   Eye exam for diabetics  05/30/2022   Complete foot exam   06/28/2022*   COVID-19 Vaccine (5 - 2023-24 season) 07/13/2022*   Zoster (Shingles) Vaccine (1 of 2) 09/25/2022*   Colon Cancer Screening  06/28/2023*   Hemoglobin A1C  09/07/2022   Yearly kidney health urinalysis for diabetes  09/08/2022   Yearly kidney function blood test for diabetes  05/10/2023   Medicare Annual Wellness Visit  06/28/2023   Hepatitis C Screening: USPSTF Recommendation to screen - Ages 18-79 yo.  Completed   HPV Vaccine  Aged Out   Pneumonia Vaccine  Discontinued  *Topic was postponed. The date shown is not the original due date.    Advanced directives: Advance directive discussed with you today. Even though you declined this today, please call our office should you change your mind, and we can give you the proper paperwork for you to fill out.   Conditions/risks identified: None  Next appointment: Follow up in one year for your annual wellness visit.    Preventive Care 18 Years and Older, Male  Preventive care refers to lifestyle choices and visits with your health care provider that can promote health and wellness. What does preventive care include? A yearly physical exam. This is also called an annual well check. Dental exams once or twice a year. Routine eye exams. Ask your health care provider how often you should have your eyes checked. Personal lifestyle choices, including: Daily care of your teeth and  gums. Regular physical activity. Eating a healthy diet. Avoiding tobacco and drug use. Limiting alcohol use. Practicing safe sex. Taking low doses of aspirin every day. Taking vitamin and mineral supplements as recommended by your health care provider. What happens during an annual well check? The services and screenings done by your health care provider during your annual well check will depend on your age, overall health, lifestyle risk factors, and family history of disease. Counseling  Your health care provider may ask you questions about your: Alcohol use. Tobacco use. Drug use. Emotional well-being. Home and relationship well-being. Sexual activity. Eating habits. History of falls. Memory and ability to understand (cognition). Work and work Statistician. Screening  You may have the following tests or measurements: Height, weight, and BMI. Blood pressure. Lipid and cholesterol levels. These may be checked every 5 years, or more frequently if you are over 35 years old. Skin check. Lung cancer screening. You may have this screening every year starting at age 72 if you have a 30-pack-year history of smoking and currently smoke or have quit within the past 15 years. Fecal occult blood test (FOBT) of the stool. You may have this test every year starting at age 3. Flexible sigmoidoscopy or colonoscopy. You may have a sigmoidoscopy every 5 years or a colonoscopy every 10 years starting at age 63. Prostate cancer screening. Recommendations will vary depending on your family history  and other risks. Hepatitis C blood test. Hepatitis B blood test. Sexually transmitted disease (STD) testing. Diabetes screening. This is done by checking your blood sugar (glucose) after you have not eaten for a while (fasting). You may have this done every 1-3 years. Abdominal aortic aneurysm (AAA) screening. You may need this if you are a current or former smoker. Osteoporosis. You may be screened  starting at age 73 if you are at high risk. Talk with your health care provider about your test results, treatment options, and if necessary, the need for more tests. Vaccines  Your health care provider may recommend certain vaccines, such as: Influenza vaccine. This is recommended every year. Tetanus, diphtheria, and acellular pertussis (Tdap, Td) vaccine. You may need a Td booster every 10 years. Zoster vaccine. You may need this after age 51. Pneumococcal 13-valent conjugate (PCV13) vaccine. One dose is recommended after age 100. Pneumococcal polysaccharide (PPSV23) vaccine. One dose is recommended after age 19. Talk to your health care provider about which screenings and vaccines you need and how often you need them. This information is not intended to replace advice given to you by your health care provider. Make sure you discuss any questions you have with your health care provider. Document Released: 06/09/2015 Document Revised: 01/31/2016 Document Reviewed: 03/14/2015 Elsevier Interactive Patient Education  2017 [place] Prevention in the Home Falls can cause injuries. They can happen to people of all ages. There are many things you can do to make your home safe and to help prevent falls. What can I do on the outside of my home? Regularly fix the edges of walkways and driveways and fix any cracks. Remove anything that might make you trip as you walk through a door, such as a raised step or threshold. Trim any bushes or trees on the path to your home. Use bright outdoor lighting. Clear any walking paths of anything that might make someone trip, such as rocks or tools. Regularly check to see if handrails are loose or broken. Make sure that both sides of any steps have handrails. Any raised decks and porches should have guardrails on the edges. Have any leaves, snow, or ice cleared regularly. Use sand or salt on walking paths during winter. Clean up any spills in your garage  right away. This includes oil or grease spills. What can I do in the bathroom? Use night lights. Install grab bars by the toilet and in the tub and shower. Do not use towel bars as grab bars. Use non-skid mats or decals in the tub or shower. If you need to sit down in the shower, use a plastic, non-slip stool. Keep the floor dry. Clean up any water that spills on the floor as soon as it happens. Remove soap buildup in the tub or shower regularly. Attach bath mats securely with double-sided non-slip rug tape. Do not have throw rugs and other things on the floor that can make you trip. What can I do in the bedroom? Use night lights. Make sure that you have a light by your bed that is easy to reach. Do not use any sheets or blankets that are too big for your bed. They should not hang down onto the floor. Have a firm chair that has side arms. You can use this for support while you get dressed. Do not have throw rugs and other things on the floor that can make you trip. What can I do in the kitchen? Clean up any spills  right away. Avoid walking on wet floors. Keep items that you use a lot in easy-to-reach places. If you need to reach something above you, use a strong step stool that has a grab bar. Keep electrical cords out of the way. Do not use floor polish or wax that makes floors slippery. If you must use wax, use non-skid floor wax. Do not have throw rugs and other things on the floor that can make you trip. What can I do with my stairs? Do not leave any items on the stairs. Make sure that there are handrails on both sides of the stairs and use them. Fix handrails that are broken or loose. Make sure that handrails are as long as the stairways. Check any carpeting to make sure that it is firmly attached to the stairs. Fix any carpet that is loose or worn. Avoid having throw rugs at the top or bottom of the stairs. If you do have throw rugs, attach them to the floor with carpet tape. Make  sure that you have a light switch at the top of the stairs and the bottom of the stairs. If you do not have them, ask someone to add them for you. What else can I do to help prevent falls? Wear shoes that: Do not have high heels. Have rubber bottoms. Are comfortable and fit you well. Are closed at the toe. Do not wear sandals. If you use a stepladder: Make sure that it is fully opened. Do not climb a closed stepladder. Make sure that both sides of the stepladder are locked into place. Ask someone to hold it for you, if possible. Clearly mark and make sure that you can see: Any grab bars or handrails. First and last steps. Where the edge of each step is. Use tools that help you move around (mobility aids) if they are needed. These include: Canes. Walkers. Scooters. Crutches. Turn on the lights when you go into a dark area. Replace any light bulbs as soon as they burn out. Set up your furniture so you have a clear path. Avoid moving your furniture around. If any of your floors are uneven, fix them. If there are any pets around you, be aware of where they are. Review your medicines with your doctor. Some medicines can make you feel dizzy. This can increase your chance of falling. Ask your doctor what other things that you can do to help prevent falls. This information is not intended to replace advice given to you by your health care provider. Make sure you discuss any questions you have with your health care provider. Document Released: 03/09/2009 Document Revised: 10/19/2015 Document Reviewed: 06/17/2014 Elsevier Interactive Patient Education  2017 [employer].

## 2022-06-27 NOTE — Progress Notes (Signed)
Subjective:   Kirk [name] is a 67 y.o. male who presents for Medicare Annual/Subsequent preventive examination.  Review of Systems    Virtual Visit via Telephone Note  I connected with  Kirk [name] on 06/27/22 at  8:45 AM EST by telephone and verified that I am speaking with the correct person using two identifiers.  Location: Patient: Home Provider: Office Persons participating in the virtual visit: patient/Nurse Health Advisor   I discussed the limitations, risks, security and privacy concerns of performing an evaluation and management service by telephone and the availability of in person appointments. The patient expressed understanding and agreed to proceed.  Interactive audio and video telecommunications were attempted between this nurse and patient, however failed, due to patient having technical difficulties OR patient did not have access to video capability.  We continued and completed visit with audio only.  Some vital signs may be absent or patient reported.   Kirk Peaches, LPN  Cardiac Risk Factors include: advanced age (>59men, >34 women);male gender;hypertension;diabetes mellitus     Objective:    Today's Vitals   06/27/22 0928  Weight: 233 lb (105.7 kg)  Height: 6\' 3"  (1.905 m)   Body mass index is 29.12 kg/m.     06/27/2022    9:35 AM 01/16/2022   11:56 AM 06/28/2021   11:59 AM 06/07/2021    9:43 AM 05/17/2021    9:34 AM 04/05/2021    9:29 AM 03/15/2021   10:34 AM  Advanced Directives  Does Patient Have a Medical Advance Directive? No Yes Yes   No Yes  Type of Armed forces technical officer of [employer] Power of [place]    Does patient want to make changes to medical advance directive?   No - Patient declined    No - Patient declined  Copy of [place] in Chart?  No - copy requested No - copy requested No - copy requested No - copy requested    Would  patient like information on creating a medical advance directive? No - Patient declined No - Patient declined No - Patient declined        Current Medications (verified) Outpatient Encounter Medications as of 06/27/2022  Medication Sig   acetaminophen (TYLENOL) 500 MG tablet Take 500 mg by mouth every 6 (six) hours as needed for moderate pain or fever.    amoxicillin-clavulanate (AUGMENTIN) 875-125 MG tablet Take 1 tablet by mouth 2 (two) times daily.   aspirin EC 81 MG tablet Take 81 mg by mouth at bedtime.   benzonatate (TESSALON) 200 MG capsule Take 1 capsule (200 mg total) by mouth 2 (two) times daily as needed for cough.   blood glucose meter kit and supplies Dispense based on patient and insurance preference. Use up to four times daily as directed. (FOR ICD-10 E10.9, E11.9).   carvedilol (COREG) 12.5 MG tablet TAKE 1 TABLET (12.5 MG TOTAL) BY MOUTH 2 (TWO) TIMES DAILY WITH A MEAL.   clopidogrel (PLAVIX) 75 MG tablet TAKE 1 TABLET BY MOUTH EVERY DAY   docusate sodium (COLACE) 100 MG capsule TAKE 1 CAPSULE (100 MG TOTAL) BY MOUTH 2 (TWO) TIMES DAILY AS NEEDED FOR MILD CONSTIPATION.   empagliflozin (JARDIANCE) 10 MG TABS tablet Take 1 tablet (10 mg total) by mouth daily before breakfast.   ferrous sulfate 324 MG TBEC Take 1 tablet (324 mg total) by mouth daily with breakfast. (Patient not taking: Reported on 03/18/2022)  gabapentin (NEURONTIN) 300 MG capsule TAKE 1 CAPSULE BY MOUTH THREE TIMES A DAY   glimepiride (AMARYL) 2 MG tablet Take 1 tablet (2 mg total) by mouth daily before breakfast.   glucose blood (ACCU-CHEK GUIDE) test strip Use as instructed up to four times daily as directed.   metFORMIN (GLUCOPHAGE-XR) 500 MG 24 hr tablet Take 1 tablet (500 mg total) by mouth 2 (two) times daily with a meal.   Misc. Devices (ROLLATOR ULTRA-LIGHT) MISC 1 each by Does not apply route daily.   nitroGLYCERIN (NITROSTAT) 0.4 MG SL tablet Place 1 tablet (0.4 mg total) under the tongue every 5 (five)  minutes x 3 doses as needed for chest pain.   rosuvastatin (CRESTOR) 40 MG tablet TAKE 1 TABLET BY MOUTH EVERY DAY   No facility-administered encounter medications on file as of 06/27/2022.    Allergies (verified) Patient has no known allergies.   History: Past Medical History:  Diagnosis Date   Arthritis    "right knee" (09/04/2015)   Atrial flutter with rapid ventricular response ([place]) 04/19/2019   Dizziness 10/2019   Dyspnea    increased exertion   Elevated serum creatinine 07/2020   HTN (hypertension)    Hyperlipidemia    Lung cancer ([facility]) 05/2019   Microalbuminuria 07/2020   NSTEMI (non-ST elevated myocardial infarction) ([place]) 09/04/2015   a. cath 09/05/2015: 95% stenosis mid-LAD, 55% mid-RCA, and 40% prox-RCA. PCI performed w/ Synergy DES to LAD   PAD (peripheral artery disease) ([facility])    Stenting of bilateral iliacs in 2003.   Refusal of blood transfusions as patient is Jehovah's Witness    Type II diabetes mellitus ([place])    Vitamin D deficiency 07/2020   Past Surgical History:  Procedure Laterality Date   CARDIAC CATHETERIZATION N/A 09/05/2015   Procedure: Left Heart Cath and Coronary Angiography;  Surgeon: Troy Sine, MD;  Location: [facility];  Service: Cardiovascular;  Laterality: N/A;   CARDIAC CATHETERIZATION N/A 09/05/2015   Procedure: Coronary Stent Intervention;  Surgeon: Troy Sine, MD;  Location: [facility];  Service: Cardiovascular;  Laterality: N/A;   IR IMAGING GUIDED PORT INSERTION  07/15/2019   ORIF CONGENITAL HIP DISLOCATION Left ~ 1965   "put pins in it"   PERIPHERAL VASCULAR CATHETERIZATION Bilateral 2003   "stenting"   TONSILLECTOMY  ~ 1963   Family History  Problem Relation Age of Onset   Lymphoma Mother    Cancer - Prostate Father    CAD Neg Hx    Colon cancer Neg Hx    Rectal cancer Neg Hx    Stomach cancer Neg Hx    Esophageal cancer Neg Hx    Social History   Socioeconomic History   Marital status: Married     Spouse name: Not on file   Number of children: 1   Years of education: Not on file   Highest education level: Not on file  Occupational History   Occupation: Furniture salesman  Tobacco Use   Smoking status: Former    Packs/day: 2.00    Years: 35.00    Total pack years: 70.00    Types: Cigarettes    Quit date: 02/19/2003    Years since quitting: 19.3   Smokeless tobacco: Never  Vaping Use   Vaping Use: Never used  Substance and Sexual Activity   Alcohol use: Not Currently    Alcohol/week: 0.0 standard drinks of alcohol   Drug use: Not Currently    Types: Marijuana  Sexual activity: Not Currently  Other Topics Concern   Not on file  Social History Narrative   Not on file   Social Determinants of Health   Financial Resource Strain: Low Risk  (06/27/2022)   Overall Financial Resource Strain (CARDIA)    Difficulty of Paying Living Expenses: Not hard at all  Food Insecurity: No Food Insecurity (06/27/2022)   Hunger Vital Sign    Worried About Running Out of Food in the Last Year: Never true    Ran Out of Food in the Last Year: Never true  Transportation Needs: No Transportation Needs (06/27/2022)   PRAPARE - Hydrologist (Medical): No    Lack of Transportation (Non-Medical): No  Physical Activity: Sufficiently Active (06/27/2022)   Exercise Vital Sign    Days of Exercise per Week: 5 days    Minutes of Exercise per Session: 30 min  Stress: No Stress Concern Present (06/27/2022)   [place]    Feeling of Stress : Not at all  Social Connections: [place] (06/27/2022)   Social Connection and Isolation Panel [NHANES]    Frequency of Communication with Friends and Family: More than three times a week    Frequency of Social Gatherings with Friends and Family: More than three times a week    Attends Religious Services: More than 4 times per year    Active Member of [employer] or  Organizations: Yes    Attends Music therapist: More than 4 times per year    Marital Status: Married    Tobacco Counseling Counseling given: Not Answered   Clinical Intake:  Pre-visit preparation completed: Yes  Pain : No/denies pain Nutrition Risk Assessment:  Has the patient had any N/V/D within the last 2 months?  No  Does the patient have any non-healing wounds?  No  Has the patient had any unintentional weight loss or weight gain?  No   Diabetes:  Is the patient diabetic?  Yes  If diabetic, was a CBG obtained today?  No  Did the patient bring in their glucometer from home?  No  How often do you monitor your CBG's? 2 X Daily.   Financial Strains and Diabetes Management:  Are you having any financial strains with the device, your supplies or your medication? No .  Does the patient want to be seen by Chronic Care Management for management of their diabetes?  No  Would the patient like to be referred to a Nutritionist or for Diabetic Management?  No   Diabetic Exams:  Diabetic Eye Exam: Completed No. Overdue for diabetic eye exam. Pt has been advised about the importance in completing this exam. A referral has been placed today. Message sent to referral coordinator for scheduling purposes. Advised pt to expect a call from office referred to regarding appt.  Diabetic Foot Exam: Completed No. Pt has been advised about the importance in completing this exam. Pt is scheduled for diabetic foot exam on Followed by PCP.      BMI - recorded: 29.12 Nutritional Status: BMI 25 -29 Overweight Nutritional Risks: None Diabetes: Yes CBG done?: No Did pt. bring in CBG monitor from home?: No  How often do you need to have someone help you when you read instructions, pamphlets, or other written materials from your doctor or pharmacy?: 1 - Never  Diabetic?  Yes  Interpreter Needed?: No  Information entered by :: Rolene Arbour LPN   Activities of  Daily Living     06/27/2022    9:34 AM 06/27/2022    8:58 AM  In your present state of health, do you have any difficulty performing the following activities:  Hearing? 0 0  Vision? 0 0  Difficulty concentrating or making decisions? 0 0  Walking or climbing stairs? 0 0  Dressing or bathing? 0 0  Doing errands, shopping? 0 0  Preparing Food and eating ? N N  Using the Toilet? N N  In the past six months, have you accidently leaked urine? N N  Do you have problems with loss of bowel control? N N  Managing your Medications? N N  Managing your Finances? N N  Housekeeping or managing your Housekeeping? N N    Patient Care Team: Fenton Foy, NP as PCP - General (Pulmonary Disease) Burnell Blanks, MD as PCP - Cardiology (Cardiology) Valrie Hart, RN as Oncology Nurse Navigator Osker Mason, RPH-CPP (Pharmacist)  Indicate any recent Medical Services you may have received from other than [facility] providers in the past year (date may be approximate).     Assessment:   This is a routine wellness examination for [facility].  Hearing/Vision screen Hearing Screening - Comments:: Denies hearing difficulties   Vision Screening - Comments:: Wears rx glasses - up to date with routine eye exams with  Dr Herbert Deaner  Dietary issues and exercise activities discussed: Current Exercise Habits: Home exercise routine, Type of exercise: walking, Time (Minutes): 30, Frequency (Times/Week): 5, Weekly Exercise (Minutes/Week): 150, Intensity: Moderate, Exercise limited by: None identified   Goals Addressed               This Visit's Progress     Stay Healthy (pt-stated)        Stay Alive       Depression Screen    06/27/2022    9:33 AM 03/08/2022    1:57 PM 05/31/2021   11:13 AM 02/28/2021   11:36 AM 04/05/2020    2:24 PM 02/28/2020    4:12 PM 08/09/2019    3:53 PM  PHQ 2/9 Scores  PHQ - 2 Score 0 0 0 0 0 0 0  PHQ- 9 Score  0       Exception Documentation     Medical reason Medical reason      Fall Risk    06/27/2022    9:35 AM 06/27/2022    8:58 AM 03/08/2022    1:56 PM 07/23/2021   11:32 AM 05/31/2021   11:13 AM  Fall Risk   Falls in the past year? 0 0 0 1 0  Number falls in past yr: 0 0 0 0 0  Injury with Fall? 0 0 0 0 0  Risk for fall due to : No Fall Risks      Follow up Falls prevention discussed  Falls evaluation completed      [place]:  Any stairs in or around the home? No  If so, are there any without handrails? No  Home free of loose throw rugs in walkways, pet beds, electrical cords, etc? Yes  Adequate lighting in your home to reduce risk of falls? Yes   ASSISTIVE DEVICES UTILIZED TO PREVENT FALLS:  Life alert? No  Use of a cane, walker or w/c? No  Grab bars in the bathroom? No  Shower chair or bench in shower? Yes  Elevated toilet seat or a handicapped toilet? No   TIMED UP AND GO:  Was the test performed? No . Audio Visit  Cognitive Function:        06/27/2022    9:35 AM 02/15/2021    6:45 PM  6CIT Screen  What Year? 0 points 0 points  What month? 0 points 0 points  What time? 0 points 0 points  Count back from 20 0 points 0 points  Months in reverse 0 points 0 points  Repeat phrase 0 points 2 points  Total Score 0 points 2 points    Immunizations Immunization History  Administered Date(s) Administered   Fluad Quad(high Dose 65+) 03/21/2020, 03/15/2021, 03/08/2022   Influenza, High Dose Seasonal PF 03/22/2019   PFIZER(Purple Top)SARS-COV-2 Vaccination 01/18/2020, 07/05/2020, 08/02/2020, 09/27/2020   Pneumococcal Conjugate-13 03/22/2019    TDAP status: Due, Education has been provided regarding the importance of this vaccine. Advised may receive this vaccine at local pharmacy or Health Dept. Aware to provide a copy of the vaccination record if obtained from local pharmacy or Health Dept. Verbalized acceptance and understanding.  Flu Vaccine status: Up to date  Pneumococcal vaccine status: Up to  date  Covid-19 vaccine status: Completed vaccines  Qualifies for Shingles Vaccine? Yes   Zostavax completed No   Shingrix Completed?: No.    Education has been provided regarding the importance of this vaccine. Patient has been advised to call insurance company to determine out of pocket expense if they have not yet received this vaccine. Advised may also receive vaccine at local pharmacy or Health Dept. Verbalized acceptance and understanding.  Screening Tests Health Maintenance  Topic Date Due   DTaP/Tdap/Td (1 - Tdap) Never done   OPHTHALMOLOGY EXAM  05/30/2022   FOOT EXAM  06/28/2022 (Originally 02/28/2022)   COVID-19 Vaccine (5 - 2023-24 season) 07/13/2022 (Originally 01/25/2022)   Zoster Vaccines- Shingrix (1 of 2) 09/25/2022 (Originally 01/29/1975)   COLONOSCOPY (Pts 45-92yrs Insurance coverage will need to be confirmed)  06/28/2023 (Originally 05/16/2022)   HEMOGLOBIN A1C  09/07/2022   Diabetic kidney evaluation - Urine ACR  09/08/2022   Diabetic kidney evaluation - eGFR measurement  05/10/2023   Medicare Annual Wellness (AWV)  06/28/2023   Hepatitis C Screening  Completed   HPV VACCINES  Aged Out   Pneumonia Vaccine 9+ Years old  Discontinued    Health Maintenance  Health Maintenance Due  Topic Date Due   DTaP/Tdap/Td (1 - Tdap) Never done   OPHTHALMOLOGY EXAM  05/30/2022    Colorectal cancer screening: Referral to GI placed 06/27/21. Pt aware the office will call re: appt.  Lung Cancer Screening: (Low Dose CT Chest recommended if Age 59-80 years, 30 pack-year currently smoking OR have quit w/in 15years.) does qualify.   Lung Cancer Screening Referral: Deferred  Additional Screening:  Hepatitis C Screening: does qualify; Completed   Vision Screening: Recommended annual ophthalmology exams for early detection of glaucoma and other disorders of the eye. Is the patient up to date with their annual eye exam?  Yes  Who is the provider or what is the name of the office in  which the patient attends annual eye exams? Dr Herbert Deaner If pt is not established with a provider, would they like to be referred to a provider to establish care? No .   Dental Screening: Recommended annual dental exams for proper oral hygiene  Community Resource Referral / Chronic Care Management:  CRR required this visit?  No   CCM required this visit?  No      Plan:     I have personally  reviewed and noted the following in the patient's chart:   Medical and social history Use of alcohol, tobacco or illicit drugs  Current medications and supplements including opioid prescriptions. Patient is not currently taking opioid prescriptions. Functional ability and status Nutritional status Physical activity Advanced directives List of other physicians Hospitalizations, surgeries, and ER visits in previous 12 months Vitals Screenings to include cognitive, depression, and falls Referrals and appointments  In addition, I have reviewed and discussed with patient certain preventive protocols, quality metrics, and best practice recommendations. A written personalized care plan for preventive services as well as general preventive health recommendations were provided to patient.     Kirk Peaches, LPN   05/29/2447   Nurse Notes: None

## 2022-07-04 ENCOUNTER — Other Ambulatory Visit: Payer: Medicare Other | Admitting: Pharmacist

## 2022-07-04 MED ORDER — EMPAGLIFLOZIN 25 MG PO TABS: 25.0000 mg | ORAL_TABLET | Freq: Every day | ORAL | 1 refills | Status: DC

## 2022-07-04 NOTE — Progress Notes (Signed)
07/04/2022 Name: NISHANTH MCCAUGHAN MRN: [id number] DOB: 26-Dec-1955  Chief Complaint  Patient presents with   Medication Management   Diabetes    ARLESTER KEEHAN is a 67 y.o. year old male who presented for a telephone visit.   They were referred to the pharmacist by their PCP for assistance in managing diabetes.    Subjective:  Care Team: Primary Care Provider: Fenton Foy, NP ; Next Scheduled Visit: none; assisted in scheduling today  Medication Access/Adherence  Current Pharmacy:  [id number] [address], [name] [address] Phone: [phone] Fax: [place], [name] - [address] [name] [address] Phone: [phone] Fax: [phone]   Patient reports affordability concerns with their medications:  No Patient reports access/transportation concerns to their pharmacy: No  Patient reports adherence concerns with their medications:  No     Diabetes:  Current medications: metformin XR 500 mg twice daily, glimepiride 2 mg daily, Jardiance 10 mg daily  Patient reports hypoglycemic s/sx including dizziness, shakiness, sweating. Patient denies hyperglycemic symptoms including polyuria, polydipsia, polyphagia, nocturia, neuropathy, blurred vision.  Current meal patterns: has cut back on breads, sweets. Focus on fruits and vegetables.   Does note some constipation.   Hypertension:  Current medications: carvedilol 12.5 mg twice daily Medications previously tried: lisinopril - started by nephrology, resulted in AKI  Patient has an upper arm home BP cuff Current blood pressure readings readings: SBP 140s per patient reports  Patient denies hypotensive s/sx including dizziness, lightheadedness.  Patient denies hypertensive symptoms including headache, chest pain, shortness of breath   Objective:  Lab Results  Component Value Date    HGBA1C 8.1 (A) 03/08/2022    Lab Results  Component Value Date   CREATININE 1.55 (H) 05/09/2022   BUN 24 (H) 05/09/2022   NA 138 05/09/2022   K 4.2 05/09/2022   CL 102 05/09/2022   CO2 29 05/09/2022    Lab Results  Component Value Date   CHOL 117 06/28/2020   HDL 35 (L) 06/28/2020   LDLCALC 59 06/28/2020   TRIG 132 06/28/2020   CHOLHDL 3.3 06/28/2020    Medications Reviewed Today     Reviewed by Osker Mason, RPH-CPP (Pharmacist) on 07/04/22 at 1352  Med List Status: <None>   Medication Order Taking? Sig Documenting Provider Last Dose Status Informant  acetaminophen (TYLENOL) 500 MG tablet [id number]  Take 500 mg by mouth every 6 (six) hours as needed for moderate pain or fever.  [provider]  Active Self  aspirin EC 81 MG tablet [id number]  Take 81 mg by mouth at bedtime. [provider]  Active Self  benzonatate (TESSALON) 200 MG capsule [id number]  Take 1 capsule (200 mg total) by mouth 2 (two) times daily as needed for cough. Fenton Foy, NP  Active   blood glucose meter kit and supplies [id number]  Dispense based on patient and insurance preference. Use up to four times daily as directed. (FOR ICD-10 E10.9, E11.9). [name], NP  Active   carvedilol (COREG) 12.5 MG tablet [id number] Yes TAKE 1 TABLET (12.5 MG TOTAL) BY MOUTH 2 (TWO) TIMES DAILY WITH A MEAL. Fenton Foy, NP Taking Active   clopidogrel (PLAVIX) 75 MG tablet [id number] Yes TAKE 1 TABLET BY MOUTH EVERY DAY Fenton Foy, NP Taking Active   docusate sodium (COLACE) 100 MG capsule [id number] Yes TAKE 1 CAPSULE (  100 MG TOTAL) BY MOUTH 2 (TWO) TIMES DAILY AS NEEDED FOR MILD CONSTIPATION. Fenton Foy, NP Taking Active   empagliflozin (JARDIANCE) 10 MG TABS tablet [id number] Yes Take 1 tablet (10 mg total) by mouth daily before breakfast. Fenton Foy, NP Taking Active   ferrous sulfate 324 MG TBEC [id number]  Take 1 tablet (324 mg total) by mouth daily with breakfast.   Patient not taking: Reported on 03/18/2022   [name], FNP  Active   gabapentin (NEURONTIN) 300 MG capsule [id number]  TAKE 1 CAPSULE BY MOUTH THREE TIMES A DAY Fenton Foy, NP  Active   glimepiride (AMARYL) 2 MG tablet [id number] Yes Take 1 tablet (2 mg total) by mouth daily before breakfast. Fenton Foy, NP Taking Active   glucose blood (ACCU-CHEK GUIDE) test strip [id number]  Use as instructed up to four times daily as directed. [name], NP  Active   metFORMIN (GLUCOPHAGE-XR) 500 MG 24 hr tablet [id number] Yes Take 1 tablet (500 mg total) by mouth 2 (two) times daily with a meal. Fenton Foy, NP Taking Active   Misc. Devices (ROLLATOR [place]) MISC [id number]  1 each by Does not apply route daily. Fenton Foy, NP  Active   nitroGLYCERIN (NITROSTAT) 0.4 MG SL tablet [id number]  Place 1 tablet (0.4 mg total) under the tongue every 5 (five) minutes x 3 doses as needed for chest pain. Imogene Burn, PA-C  Active            Med Note [name]   Mon Aug 09, 2019  4:23 PM) As needed  rosuvastatin (CRESTOR) 40 MG tablet [id number] Yes TAKE 1 TABLET BY MOUTH EVERY DAY Fenton Foy, NP Taking Active               Assessment/Plan:   Diabetes: - Currently uncontrolled but improved  - Reviewed long term cardiovascular and renal outcomes of uncontrolled blood sugar - Reviewed goal A1c, goal fasting, and goal 2 hour post prandial glucose - Reviewed dietary modifications including: increase in fluids and fiber to help with constipation - Recommend to discontinue glimepiride, increase Jardiance to 25 mg daily. Continue metformin at current dose. Discussed with PCP, she is in agreement. Patient verbalizes understanding  - Recommend to check glucose twice daily, fasting and 2 hour post prandial   Hypertension: - Currently uncontrolled - Reviewed long term cardiovascular and renal outcomes of uncontrolled blood pressure - Reviewed appropriate blood  pressure monitoring technique and reviewed goal blood pressure. Recommended to check home blood pressure and heart rate daily - Recommend to write down readings and bring to next appointment. If BP remains elevated, recommend addition of amlodipine    Follow Up Plan: phone call in ~ 6 weeks  [name], PharmD, [place], [facility] [phone]

## 2022-07-04 NOTE — Patient Instructions (Signed)
Kirk [name],   It was great talking to you today!  Increase Jardiance to 25 mg daily. Stop glimepiride. Continue metformin twice daily.   Check your blood sugars twice daily:  1) Fasting, first thing in the morning before breakfast and  2) 2 hours after your largest meal.   For a goal A1c of less than 7%, goal fasting readings are less than 130 and goal 2 hour after meal readings are less than 180.    Check your blood pressure once daily, and any time you have concerning symptoms like headache, chest pain, dizziness, shortness of breath, or vision changes.   Our goal is less than 130/80.  To appropriately check your blood pressure, make sure you do the following:  1) Avoid caffeine, exercise, or tobacco products for 30 minutes before checking. Empty your bladder. 2) Sit with your back supported in a flat-backed chair. Rest your arm on something flat (arm of the chair, table, etc). 3) Sit still with your feet flat on the floor, resting, for at least 5 minutes.  4) Check your blood pressure. Take 1-2 readings.  5) Write down these readings and bring with you to any provider appointments.  Bring your home blood pressure machine with you to a provider's office for accuracy comparison at least once a year.   Make sure you take your blood pressure medications before you come to any office visit, even if you were asked to fast for labs.   [name], PharmD, [place], [facility] [phone]

## 2022-07-11 ENCOUNTER — Ambulatory Visit (INDEPENDENT_AMBULATORY_CARE_PROVIDER_SITE_OTHER): Payer: Medicare Other | Admitting: Nurse Practitioner

## 2022-07-11 ENCOUNTER — Encounter: Payer: Self-pay | Admitting: Nurse Practitioner

## 2022-07-11 VITALS — BP 132/59 | HR 76 | Temp 97.2°F | Ht 75.0 in | Wt 232.2 lb

## 2022-07-11 DIAGNOSIS — J069 Acute upper respiratory infection, unspecified: Secondary | ICD-10-CM | POA: Insufficient documentation

## 2022-07-11 DIAGNOSIS — G4700 Insomnia, unspecified: Secondary | ICD-10-CM | POA: Diagnosis not present

## 2022-07-11 DIAGNOSIS — Z13220 Encounter for screening for lipoid disorders: Secondary | ICD-10-CM | POA: Diagnosis not present

## 2022-07-11 DIAGNOSIS — E119 Type 2 diabetes mellitus without complications: Secondary | ICD-10-CM | POA: Diagnosis not present

## 2022-07-11 LAB — POCT GLYCOSYLATED HEMOGLOBIN (HGB A1C): Hemoglobin A1C: 7.6 % — AB (ref 4.0–5.6)

## 2022-07-11 MED ORDER — TRAZODONE HCL 50 MG PO TABS: 25.0000 mg | ORAL_TABLET | Freq: Every evening | ORAL | 3 refills | Status: DC | PRN

## 2022-07-11 NOTE — Assessment & Plan Note (Signed)
-   POCT glycosylated hemoglobin (Hb A1C) - Microalbumin/Creatinine Ratio, Urine - Lipid Panel  2. Lipid screening  - Lipid Panel  3. Insomnia, unspecified type  - traZODone (DESYREL) 50 MG tablet; Take 0.5-1 tablets (25-50 mg total) by mouth at bedtime as needed for sleep.  Dispense: 30 tablet; Refill: 3   Follow up:  Follow up in 3 months

## 2022-07-11 NOTE — Patient Instructions (Signed)
1. Type 2 diabetes mellitus without complication, without long-term current use of insulin (HCC)  - POCT glycosylated hemoglobin (Hb A1C) - Microalbumin/Creatinine Ratio, Urine - Lipid Panel  2. Lipid screening  - Lipid Panel  3. Insomnia, unspecified type  - traZODone (DESYREL) 50 MG tablet; Take 0.5-1 tablets (25-50 mg total) by mouth at bedtime as needed for sleep.  Dispense: 30 tablet; Refill: 3   Follow up:  Follow up in 3 months

## 2022-07-11 NOTE — Progress Notes (Signed)
Subjective:    Patient ID: Kirk [name], male    DOB: 1956/01/31, 67 y.o.   MRN: [id number]  Kirk [name] is a 67 y.o. male who presents for follow-up of Type 2 diabetes mellitus.  Kirk [name] 67 y.o. male  has a past medical history of Arthritis, Atrial flutter with rapid ventricular response ([place]) (04/19/2019), Dizziness (10/2019), Dyspnea, Elevated serum creatinine (07/2020), HTN (hypertension), Hyperlipidemia, Lung cancer ([place]) (05/2019), Microalbuminuria (07/2020), NSTEMI (non-ST elevated myocardial infarction) ([place]) (09/04/2015), PAD (peripheral artery disease) ([place]), Refusal of blood transfusions as patient is Jehovah's Witness, Type II diabetes mellitus ([place]), and Vitamin D deficiency (07/2020). To the [facility] for reevaluation of DM2.      Patient states that he was diagnosed and treated for stage 4 lung cancer. States that he completed six rounds of chemotherapy and oral therapy. Was informed by oncologist that cancer is stable, but not in remission. Completes follow up every 3 mths for reevaluation of lung cancer. lung cancer still stable.    Diabetes Mellitus: Patient presents for follow up of diabetes. Symptoms: none.  Patient denies foot ulcerations, hypoglycemia , nausea, and polydipsia.  Evaluation to date has been included: hemoglobin A1C.  Home sugars: BG. Treatment to date: no recent interventions. Denies monitoring meals, but walks his dog daily. Hgb a1c 7.6. Has not been taking metformin but once daily. Patient does follow with nephrology.    Denies any other concerns today. Denies any fatigue, chest pain, shortness of breath, HA or dizziness. Denies any blurred vision, numbness or tingling.     The following portions of the patient's history were reviewed and updated as appropriate: allergies, current medications, past medical history, past social history and problem list.  Review of Systems  Constitutional: Negative.   HENT: Negative.    Eyes: Negative.    Respiratory: Negative.    Cardiovascular: Negative.   Gastrointestinal: Negative.   Genitourinary: Negative.   Musculoskeletal: Negative.   Skin: Negative.   Neurological: Negative.   Endo/Heme/Allergies: Negative.   Psychiatric/Behavioral: Negative.            Objective:     Physical Exam Constitutional:      General: He is not in acute distress. Cardiovascular:     Rate and Rhythm: Normal rate and regular rhythm.  Pulmonary:     Effort: Pulmonary effort is normal.     Breath sounds: Normal breath sounds.  Skin:    General: Skin is warm and dry.  Neurological:     Mental Status: He is alert and oriented to person, place, and time.  Psychiatric:        Mood and Affect: Affect normal.      Blood pressure (!) 132/59, pulse 76, temperature (!) 97.2 F (36.2 C), height 6\' 3"  (1.905 m), weight 232 lb 3.2 oz (105.3 kg), SpO2 96 %.  Lab Review    Latest Ref Rng & Units 07/11/2022   11:03 AM 05/09/2022    1:40 PM 04/17/2022    2:51 PM 03/08/2022    2:08 PM 02/14/2022    1:24 PM  Diabetic Labs  HbA1c 4.0 - 5.6 % 7.6    8.1    Creatinine 0.61 - 1.24 mg/dL  1.55  1.50   1.69       07/11/2022   10:20 AM 06/27/2022    9:28 AM 05/02/2022    1:31 PM 04/17/2022    3:15 PM 04/11/2022    2:19 PM  BP/Weight  Systolic BP 361  443 154  517  Diastolic BP 59  64 66 70  Wt. (Lbs) 232.2 233 233 230.7   BMI 29.02 kg/m2 29.12 kg/m2 29.12 kg/m2 28.84 kg/m2       Latest Ref Rng & Units 07/27/2020   12:00 AM 07/02/2016    8:15 AM  Foot/eye exam completion dates  Eye Exam No Retinopathy No Retinopathy    Foot Form Completion   Done    Kirk [name]  reports that he quit smoking about 19 years ago. His smoking use included cigarettes. He has a 70.00 pack-year smoking history. He has never used smokeless tobacco. He reports that he does not currently use alcohol. He reports that he does not currently use drugs after having used the following drugs: Marijuana.     Assessment & Plan:     Type 2 diabetes mellitus without complication, without long-term current use of insulin (HCC) - Plan: POCT glycosylated hemoglobin (Hb A1C), Microalbumin/Creatinine Ratio, Urine, Lipid Panel  Lipid screening - Plan: Lipid Panel  Insomnia, unspecified type - Plan: traZODone (DESYREL) 50 MG tablet  Rx changes: none Education: Reviewed 'ABCs' of diabetes management (respective goals in parentheses):  A1C (<7), blood pressure (<130/80), and cholesterol (LDL <100). Compliance at present is estimated to be excellent. Efforts to improve compliance (if necessary) will be directed at increased exercise. Follow up: 3 months   Kirk [name]  07/11/22

## 2022-07-11 NOTE — Assessment & Plan Note (Signed)
-   azithromycin (ZITHROMAX) 250 MG tablet; Take 2 tablets on day 1, then 1 tablet daily on days 2 through 5  Dispense: 6 tablet; Refill: 0 - predniSONE (DELTASONE) 20 MG tablet; Take 1 tablet (20 mg total) by mouth daily with breakfast for 5 days.  Dispense: 5 tablet; Refill: 0 - benzonatate (TESSALON) 200 MG capsule; Take 1 capsule (200 mg total) by mouth 2 (two) times daily as needed for cough.  Dispense: 20 capsule; Refill: 0  Stay well hydrated  Stay active  Deep breathing exercises  May take tylenol or fever or pain  May take mucinex twice daily   Follow up:  Follow up as scheduled

## 2022-07-12 LAB — MICROALBUMIN / CREATININE URINE RATIO
Creatinine, Urine: 72.4 mg/dL
Microalb/Creat Ratio: 106 mg/g creat — ABNORMAL HIGH (ref 0–29)
Microalbumin, Urine: 76.7 ug/mL

## 2022-07-12 LAB — LIPID PANEL
Chol/HDL Ratio: 4.1 ratio (ref 0.0–5.0)
Cholesterol, Total: 124 mg/dL (ref 100–199)
HDL: 30 mg/dL — ABNORMAL LOW (ref >39)
LDL Chol Calc (NIH): 61 mg/dL (ref 0–99)
Triglycerides: 195 mg/dL — ABNORMAL HIGH (ref 0–149)
VLDL Cholesterol Cal: 33 mg/dL (ref 5–40)

## 2022-07-14 NOTE — Progress Notes (Unsigned)
Office Visit    Patient Name: Kirk [name] Date of Encounter: 07/15/2022  PCP:  Fenton Foy, NP   [facility]  Cardiologist:  Lauree Chandler, MD  Advanced Practice Provider:  No care team member to display Electrophysiologist:  None   HPI    Kirk [name] is a 67 y.o. male with past medical history of CAD, PAD, HLD, HTN, paroxysmal atrial flutter and DM presents today for annual follow-up visit.  He has a history of PAD status post bilateral lower extremity stenting in 2003.  ABI March 2019 stable.  Admitted to [facility] April 2017 with NSTEMI.  Cardiac cath/11/17 with high-grade mid LAD stenosis treated with DES x 1.  There was mild to moderate nonobstructive disease in the circumflex and RCA.  Echo April 2017 with normal LV systolic function.  Seen in our office April 2019 with complaint of dyspnea.  Nuclear stress test April 2019 with no ischemia.  Admitted to [facility] November 2020 with atrial flutter.  Echo November 2020 with LVEF 66 5%, no valve disease.  Started on Eliquis.  CT of the chest December 2020 with multiple lung lesions.  He has been since diagnosed with nonsmall cell lung cancer and is on chemotherapy.  Stopped Eliquis due to cost and does not wish to restart.  He does not wish to start Coumadin.  Last seen 06/2021 and denied chest pain, dyspnea, palpitations, lower extremity edema, orthopnea, PND, dizziness, syncope, or near syncope.  Today, he states that he has chronic shortness of breath due to lung cancer.  He is stage IV and underwent chemotherapy.  He has a CT scan tomorrow to reevaluate.  He quit smoking pain after 35 years.  He did have some right arm pain about 2 months ago but this subsided.  Thought to be musculoskeletal.  He walks his dogs every day for 20 minutes.  Encouraged to get 30 minutes of exercise most days of the week.  Reports no chest pain, pressure, or tightness. No edema, orthopnea, PND. Reports no palpitations.      Past Medical History    Past Medical History:  Diagnosis Date   Arthritis    "right knee" (09/04/2015)   Atrial flutter with rapid ventricular response ([place]) 04/19/2019   Dizziness 10/2019   Dyspnea    increased exertion   Elevated serum creatinine 07/2020   HTN (hypertension)    Hyperlipidemia    Lung cancer ([place]) 05/2019   Microalbuminuria 07/2020   NSTEMI (non-ST elevated myocardial infarction) ([place]) 09/04/2015   a. cath 09/05/2015: 95% stenosis mid-LAD, 55% mid-RCA, and 40% prox-RCA. PCI performed w/ Synergy DES to LAD   PAD (peripheral artery disease) (HCC)    Stenting of bilateral iliacs in 2003.   Refusal of blood transfusions as patient is Jehovah's Witness    Type II diabetes mellitus ([place])    Vitamin D deficiency 07/2020   Past Surgical History:  Procedure Laterality Date   CARDIAC CATHETERIZATION N/A 09/05/2015   Procedure: Left Heart Cath and Coronary Angiography;  Surgeon: Troy Sine, MD;  Location: [facility];  Service: Cardiovascular;  Laterality: N/A;   CARDIAC CATHETERIZATION N/A 09/05/2015   Procedure: Coronary Stent Intervention;  Surgeon: Troy Sine, MD;  Location: [facility];  Service: Cardiovascular;  Laterality: N/A;   IR IMAGING GUIDED PORT INSERTION  07/15/2019   ORIF CONGENITAL HIP DISLOCATION Left ~ 1965   "put pins in it"   PERIPHERAL VASCULAR CATHETERIZATION Bilateral  2003   "stenting"   TONSILLECTOMY  ~ 1963    Allergies  No Known Allergies  EKGs/Labs/Other Studies Reviewed:   The following studies were reviewed today:  Echo 04/20/19 IMPRESSIONS      1. Left ventricular ejection fraction, by visual estimation, is 60 to  65%. The left ventricle has normal function. There is mildly increased  left ventricular hypertrophy.   2. The left ventricle has no regional wall motion abnormalities.   3. Global right ventricle has normal systolic function.The right  ventricular size is normal. No increase in right  ventricular wall  thickness.   4. Left atrial size was mildly dilated.   5. Right atrial size was normal.   6. The mitral valve is normal in structure. No evidence of mitral valve  regurgitation. No evidence of mitral stenosis.   7. The tricuspid valve is normal in structure. Tricuspid valve  regurgitation is not demonstrated.   8. The aortic valve is normal in structure. Aortic valve regurgitation is  not visualized. No evidence of aortic valve sclerosis or stenosis.   9. The pulmonic valve was normal in structure. Pulmonic valve  regurgitation is not visualized.  10. TR signal is inadequate for assessing pulmonary artery systolic  pressure.  11. The inferior vena cava is normal in size with greater than 50%  respiratory variability, suggesting right atrial pressure of 3 mmHg.   In comparison to the previous echocardiogram(s): Prior examinations were  reviewed in a side by side comparison of images. No change since 2017.  FINDINGS   Left Ventricle: Left ventricular ejection fraction, by visual estimation,  is 60 to 65%. The left ventricle has normal function. The left ventricle  has no regional wall motion abnormalities. There is mildly increased left  ventricular hypertrophy.  Concentric left ventricular hypertrophy. Left ventricular diastolic  parameters were normal. Normal left atrial pressure.   Right Ventricle: The right ventricular size is normal. No increase in  right ventricular wall thickness. Global RV systolic function is has  normal systolic function.   Left Atrium: Left atrial size was mildly dilated.   Right Atrium: Right atrial size was normal in size   Pericardium: There is no evidence of pericardial effusion.   Mitral Valve: The mitral valve is normal in structure. No evidence of  mitral valve stenosis by observation. No evidence of mitral valve  regurgitation.   Tricuspid Valve: The tricuspid valve is normal in structure. Tricuspid  valve regurgitation is  not demonstrated.   Aortic Valve: The aortic valve is normal in structure. Aortic valve  regurgitation is not visualized. The aortic valve is structurally normal,  with no evidence of sclerosis or stenosis.   Pulmonic Valve: The pulmonic valve was normal in structure. Pulmonic valve  regurgitation is not visualized.   Aorta: The aortic root, ascending aorta and aortic arch are all  structurally normal, with no evidence of dilitation or obstruction.   Venous: The inferior vena cava is normal in size with greater than 50%  respiratory variability, suggesting right atrial pressure of 3 mmHg.   IAS/Shunts: No atrial level shunt detected by color flow Doppler. No  ventricular septal defect is seen or detected. There is no evidence of an  atrial septal defect.    EKG:  EKG is  ordered today.  The ekg ordered today demonstrates normal sinus rhythm, 88 bpm, first-degree AV block  Recent Labs: 08/30/2021: TSH 0.575 05/09/2022: ALT 26; BUN 24; Creatinine 1.55; Hemoglobin 12.3; Platelet Count 289; Potassium 4.2; Sodium  138  Recent Lipid Panel    Component Value Date/Time   CHOL 124 07/11/2022 1059   TRIG 195 (H) 07/11/2022 1059   HDL 30 (L) 07/11/2022 1059   CHOLHDL 4.1 07/11/2022 1059   CHOLHDL 3.7 03/12/2016 0837   VLDL 17 03/12/2016 0837   LDLCALC 61 07/11/2022 1059    Risk Assessment/Calculations:   CHA2DS2-VASc Score = 4   This indicates a 4.8% annual risk of stroke. The patient's score is based upon: CHF History: 0 HTN History: 1 Diabetes History: 1 Stroke History: 0 Vascular Disease History: 1 Age Score: 1 Gender Score: 0     Home Medications   Current Meds  Medication Sig   acetaminophen (TYLENOL) 500 MG tablet Take 500 mg by mouth every 6 (six) hours as needed for moderate pain or fever.    aspirin EC 81 MG tablet Take 81 mg by mouth at bedtime.   benzonatate (TESSALON) 200 MG capsule Take 1 capsule (200 mg total) by mouth 2 (two) times daily as needed for cough.    blood glucose meter kit and supplies Dispense based on patient and insurance preference. Use up to four times daily as directed. (FOR ICD-10 E10.9, E11.9).   carvedilol (COREG) 12.5 MG tablet TAKE 1 TABLET (12.5 MG TOTAL) BY MOUTH 2 (TWO) TIMES DAILY WITH A MEAL.   clopidogrel (PLAVIX) 75 MG tablet TAKE 1 TABLET BY MOUTH EVERY DAY   docusate sodium (COLACE) 100 MG capsule TAKE 1 CAPSULE (100 MG TOTAL) BY MOUTH 2 (TWO) TIMES DAILY AS NEEDED FOR MILD CONSTIPATION.   empagliflozin (JARDIANCE) 25 MG TABS tablet Take 1 tablet (25 mg total) by mouth daily before breakfast.   ferrous sulfate 324 MG TBEC Take 1 tablet (324 mg total) by mouth daily with breakfast.   gabapentin (NEURONTIN) 300 MG capsule TAKE 1 CAPSULE BY MOUTH THREE TIMES A DAY   glucose blood (ACCU-CHEK GUIDE) test strip Use as instructed up to four times daily as directed.   metFORMIN (GLUCOPHAGE-XR) 500 MG 24 hr tablet Take 1 tablet (500 mg total) by mouth 2 (two) times daily with a meal.   Misc. Devices (ROLLATOR ULTRA-LIGHT) MISC 1 each by Does not apply route daily.   rosuvastatin (CRESTOR) 40 MG tablet TAKE 1 TABLET BY MOUTH EVERY DAY   traZODone (DESYREL) 50 MG tablet Take 0.5-1 tablets (25-50 mg total) by mouth at bedtime as needed for sleep.   [DISCONTINUED] nitroGLYCERIN (NITROSTAT) 0.4 MG SL tablet Place 1 tablet (0.4 mg total) under the tongue every 5 (five) minutes x 3 doses as needed for chest pain.     Review of Systems      All other systems reviewed and are otherwise negative except as noted above.  Physical Exam    VS:  BP (!) 130/58   Pulse 88   Ht 6\' 3"  (1.905 m)   Wt 229 lb 6.4 oz (104.1 kg)   SpO2 98%   BMI 28.67 kg/m  , BMI Body mass index is 28.67 kg/m.  Wt Readings from Last 3 Encounters:  07/15/22 229 lb 6.4 oz (104.1 kg)  07/11/22 232 lb 3.2 oz (105.3 kg)  06/27/22 233 lb (105.7 kg)     GEN: Well nourished, well developed, in no acute distress. HEENT: normal. Neck: Supple, no JVD, carotid  bruits, or masses. Cardiac: RRR, no murmurs, rubs, or gallops. No clubbing, cyanosis, edema.  Radials/PT 2+ and equal bilaterally.  Respiratory:  Respirations regular and unlabored, clear to auscultation bilaterally. GI: Soft, nontender, nondistended.  MS: No deformity or atrophy. Skin: Warm and dry, no rash. Neuro:  Strength and sensation are intact. Psych: Normal affect.  Assessment & Plan    CAD without angina -Continue current treatment with aspirin 81 mg daily, Coreg 12.5 mg twice daily, Plavix 75 mg daily, Jardiance nitro as needed, Crestor 40 mg daily  Hypertension -Well-controlled today -Encouraged to continue to monitor at home -Continue current medication  Hyperlipidemia -LDL 59 -Due for repeat lipid panel through PCP -Continue Crestor 40 mg daily  PAD -Not addressed today, no current issues  Atrial flutter, paroxysmal -Normal sinus rhythm today -Not on any anticoagulation due to cost  -continue BB         Disposition: Follow up 1 year with Lauree Chandler, MD or APP.  Signed, Elgie Collard, PA-C 07/15/2022, 5:31 PM [facility]

## 2022-07-15 ENCOUNTER — Ambulatory Visit: Payer: Medicare Other | Attending: Physician Assistant | Admitting: Physician Assistant

## 2022-07-15 ENCOUNTER — Encounter: Payer: Self-pay | Admitting: Physician Assistant

## 2022-07-15 VITALS — BP 130/58 | HR 88 | Ht 75.0 in | Wt 229.4 lb

## 2022-07-15 DIAGNOSIS — E785 Hyperlipidemia, unspecified: Secondary | ICD-10-CM | POA: Insufficient documentation

## 2022-07-15 DIAGNOSIS — I10 Essential (primary) hypertension: Secondary | ICD-10-CM | POA: Insufficient documentation

## 2022-07-15 DIAGNOSIS — I2510 Atherosclerotic heart disease of native coronary artery without angina pectoris: Secondary | ICD-10-CM | POA: Insufficient documentation

## 2022-07-15 DIAGNOSIS — I739 Peripheral vascular disease, unspecified: Secondary | ICD-10-CM | POA: Insufficient documentation

## 2022-07-15 DIAGNOSIS — I4892 Unspecified atrial flutter: Secondary | ICD-10-CM | POA: Insufficient documentation

## 2022-07-15 MED ORDER — NITROGLYCERIN 0.4 MG SL SUBL: 0.4000 mg | SUBLINGUAL_TABLET | SUBLINGUAL | 2 refills | Status: DC | PRN

## 2022-07-15 NOTE — Patient Instructions (Signed)
Medication Instructions:  Your physician recommends that you continue on your current medications as directed. Please refer to the Current Medication list given to you today.  *If you need a refill on your cardiac medications before your next appointment, please call your pharmacy*   Lab Work: None ordered If you have labs (blood work) drawn today and your tests are completely normal, you will receive your results only by: [place] (if you have MyChart) OR A paper copy in the mail If you have any lab test that is abnormal or we need to change your treatment, we will call you to review the results.   Follow-Up: At [facility], you and your health needs are our priority.  As part of our continuing mission to provide you with exceptional heart care, we have created designated Provider Care Teams.  These Care Teams include your primary Cardiologist (physician) and Advanced Practice Providers (APPs -  Physician Assistants and Nurse Practitioners) who all work together to provide you with the care you need, when you need it.   Your next appointment:   1 year(s)  Provider:   Lauree Chandler, MD

## 2022-07-16 ENCOUNTER — Other Ambulatory Visit: Payer: Self-pay

## 2022-07-16 ENCOUNTER — Other Ambulatory Visit: Payer: Self-pay | Admitting: Nurse Practitioner

## 2022-07-16 ENCOUNTER — Ambulatory Visit (HOSPITAL_COMMUNITY)
Admission: RE | Admit: 2022-07-16 | Discharge: 2022-07-16 | Disposition: A | Discharge status: Home / Self Care | Payer: Medicare Other | Source: Ambulatory Visit | Attending: Internal Medicine | Admitting: Internal Medicine

## 2022-07-16 ENCOUNTER — Inpatient Hospital Stay: Payer: Medicare Other | Attending: Internal Medicine

## 2022-07-16 DIAGNOSIS — E1151 Type 2 diabetes mellitus with diabetic peripheral angiopathy without gangrene: Secondary | ICD-10-CM | POA: Insufficient documentation

## 2022-07-16 DIAGNOSIS — D509 Iron deficiency anemia, unspecified: Secondary | ICD-10-CM | POA: Insufficient documentation

## 2022-07-16 DIAGNOSIS — Z95828 Presence of other vascular implants and grafts: Secondary | ICD-10-CM

## 2022-07-16 DIAGNOSIS — C3491 Malignant neoplasm of unspecified part of right bronchus or lung: Secondary | ICD-10-CM | POA: Insufficient documentation

## 2022-07-16 DIAGNOSIS — C3490 Malignant neoplasm of unspecified part of unspecified bronchus or lung: Secondary | ICD-10-CM

## 2022-07-16 DIAGNOSIS — N289 Disorder of kidney and ureter, unspecified: Secondary | ICD-10-CM | POA: Insufficient documentation

## 2022-07-16 DIAGNOSIS — C787 Secondary malignant neoplasm of liver and intrahepatic bile duct: Secondary | ICD-10-CM | POA: Insufficient documentation

## 2022-07-16 DIAGNOSIS — I10 Essential (primary) hypertension: Secondary | ICD-10-CM | POA: Insufficient documentation

## 2022-07-16 LAB — CBC WITH DIFFERENTIAL (CANCER CENTER ONLY)
Abs Immature Granulocytes: 0.01 10*3/uL (ref 0.00–0.07)
Basophils Absolute: 0.1 10*3/uL (ref 0.0–0.1)
Basophils Relative: 1 %
Eosinophils Absolute: 0.2 10*3/uL (ref 0.0–0.5)
Eosinophils Relative: 3 %
HCT: 41.3 % (ref 39.0–52.0)
Hemoglobin: 13.8 g/dL (ref 13.0–17.0)
Immature Granulocytes: 0 %
Lymphocytes Relative: 46 %
Lymphs Abs: 2.8 10*3/uL (ref 0.7–4.0)
MCH: 29.7 pg (ref 26.0–34.0)
MCHC: 33.4 g/dL (ref 30.0–36.0)
MCV: 88.8 fL (ref 80.0–100.0)
Monocytes Absolute: 0.6 10*3/uL (ref 0.1–1.0)
Monocytes Relative: 10 %
Neutro Abs: 2.4 10*3/uL (ref 1.7–7.7)
Neutrophils Relative %: 40 %
Platelet Count: 266 10*3/uL (ref 150–400)
RBC: 4.65 MIL/uL (ref 4.22–5.81)
RDW: 13.9 % (ref 11.5–15.5)
WBC Count: 6 10*3/uL (ref 4.0–10.5)
nRBC: 0 % (ref 0.0–0.2)

## 2022-07-16 LAB — CMP (CANCER CENTER ONLY)
ALT: 22 U/L (ref 0–44)
AST: 20 U/L (ref 15–41)
Albumin: 4.6 g/dL (ref 3.5–5.0)
Alkaline Phosphatase: 69 U/L (ref 38–126)
Anion gap: 6 (ref 5–15)
BUN: 27 mg/dL — ABNORMAL HIGH (ref 8–23)
CO2: 28 mmol/L (ref 22–32)
Calcium: 9.7 mg/dL (ref 8.9–10.3)
Chloride: 104 mmol/L (ref 98–111)
Creatinine: 1.54 mg/dL — ABNORMAL HIGH (ref 0.61–1.24)
GFR, Estimated: 49 mL/min — ABNORMAL LOW (ref >60)
Glucose, Bld: 127 mg/dL — ABNORMAL HIGH (ref 70–99)
Potassium: 4 mmol/L (ref 3.5–5.1)
Sodium: 138 mmol/L (ref 135–145)
Total Bilirubin: 0.4 mg/dL (ref 0.3–1.2)
Total Protein: 8.1 g/dL (ref 6.5–8.1)

## 2022-07-16 MED ORDER — SODIUM CHLORIDE 0.9% FLUSH: 10.0000 mL | Freq: Once | INTRAVENOUS | Status: DC

## 2022-07-16 MED ORDER — SODIUM CHLORIDE 0.9% FLUSH
10.0000 mL | Freq: Once | INTRAVENOUS | Status: AC
  Administered 2022-07-16: 10 mL

## 2022-07-16 MED ORDER — HEPARIN SOD (PORK) LOCK FLUSH 100 UNIT/ML IV SOLN
500.0000 [IU] | Freq: Once | INTRAVENOUS | Status: AC
  Administered 2022-07-16: 500 [IU]

## 2022-07-16 NOTE — Addendum Note (Signed)
Addended by: Janan Halter F on: 07/16/2022 01:10 PM   Modules accepted: Orders

## 2022-07-18 ENCOUNTER — Inpatient Hospital Stay (HOSPITAL_BASED_OUTPATIENT_CLINIC_OR_DEPARTMENT_OTHER): Payer: Medicare Other | Admitting: Internal Medicine

## 2022-07-18 ENCOUNTER — Ambulatory Visit: Payer: Medicare Other | Admitting: Internal Medicine

## 2022-07-18 VITALS — BP 150/57 | HR 71 | Temp 98.0°F | Resp 19 | Wt 230.4 lb

## 2022-07-18 DIAGNOSIS — N289 Disorder of kidney and ureter, unspecified: Secondary | ICD-10-CM | POA: Diagnosis not present

## 2022-07-18 DIAGNOSIS — I10 Essential (primary) hypertension: Secondary | ICD-10-CM | POA: Diagnosis not present

## 2022-07-18 DIAGNOSIS — D509 Iron deficiency anemia, unspecified: Secondary | ICD-10-CM | POA: Diagnosis not present

## 2022-07-18 DIAGNOSIS — E1151 Type 2 diabetes mellitus with diabetic peripheral angiopathy without gangrene: Secondary | ICD-10-CM | POA: Diagnosis not present

## 2022-07-18 DIAGNOSIS — C3490 Malignant neoplasm of unspecified part of unspecified bronchus or lung: Secondary | ICD-10-CM

## 2022-07-18 DIAGNOSIS — C3491 Malignant neoplasm of unspecified part of right bronchus or lung: Secondary | ICD-10-CM | POA: Diagnosis present

## 2022-07-18 DIAGNOSIS — C787 Secondary malignant neoplasm of liver and intrahepatic bile duct: Secondary | ICD-10-CM | POA: Diagnosis not present

## 2022-07-18 NOTE — Progress Notes (Signed)
[facility] [place] [phone]   [phone]  OFFICE PROGRESS NOTE  Fenton Foy, NP [facility] [address] [name] [address]  DIAGNOSIS: Stage IV (TX, N3, M1c) non-small cell lung cancer, poorly differentiated adenocarcinoma presented with bulky mediastinal lymphadenopathy in addition to right axillary lymphadenopathy and metastatic liver lesions, abdominal lymphadenopathy diagnosed in January 2021. Molecular studies by Guardant 360 that shows no actionable mutations.  PRIOR THERAPY: Systemic chemotherapy with carboplatin for AUC of 5, Alimta 500 mg/M2 and Keytruda 200 mg IV every 3 weeks.  First dose July 14, 2019.  Status post 35  cycles. Starting from cycle #5 the patient will be treated with maintenance Alimta and Keytruda every 3 weeks.  Starting from cycle #12 he will be on single agent treatment with Keytruda secondary to renal insufficiency.  CURRENT THERAPY: Observation  INTERVAL HISTORY: Kirk MATSUO 67 y.o. male returns to clinic today for 3 months follow-up visit.  The patient is feeling fine today with no concerning complaints.  He denied having any chest pain, shortness of breath, cough or hemoptysis.  He has no nausea, vomiting, diarrhea or constipation.  He has no headache or visual changes.  He denied having any recent weight loss or night sweats.  He has no fever or chills.  He had repeat CT scan of the chest performed recently and he is here today for evaluation and discussion of his scan results.  MEDICAL HISTORY: Past Medical History:  Diagnosis Date   Arthritis    "right knee" (09/04/2015)   Atrial flutter with rapid ventricular response ([facility]) 04/19/2019   Dizziness 10/2019   Dyspnea    increased exertion   Elevated serum creatinine 07/2020   HTN (hypertension)    Hyperlipidemia    Lung cancer ([facility]) 05/2019   Microalbuminuria 07/2020   NSTEMI (non-ST elevated myocardial infarction) ([facility]) 09/04/2015   a. cath  09/05/2015: 95% stenosis mid-LAD, 55% mid-RCA, and 40% prox-RCA. PCI performed w/ Synergy DES to LAD   PAD (peripheral artery disease) ([facility])    Stenting of bilateral iliacs in 2003.   Refusal of blood transfusions as patient is Jehovah's Witness    Type II diabetes mellitus ([facility])    Vitamin D deficiency 07/2020    ALLERGIES:  has No Known Allergies.  MEDICATIONS:  Current Outpatient Medications  Medication Sig Dispense Refill   acetaminophen (TYLENOL) 500 MG tablet Take 500 mg by mouth every 6 (six) hours as needed for moderate pain or fever.      aspirin EC 81 MG tablet Take 81 mg by mouth at bedtime.     benzonatate (TESSALON) 200 MG capsule Take 1 capsule (200 mg total) by mouth 2 (two) times daily as needed for cough. 20 capsule 0   blood glucose meter kit and supplies Dispense based on patient and insurance preference. Use up to four times daily as directed. (FOR ICD-10 E10.9, E11.9). 1 each 0   carvedilol (COREG) 12.5 MG tablet TAKE 1 TABLET (12.5 MG TOTAL) BY MOUTH 2 (TWO) TIMES DAILY WITH A MEAL. 180 tablet 3   clopidogrel (PLAVIX) 75 MG tablet TAKE 1 TABLET BY MOUTH EVERY DAY 90 tablet 3   docusate sodium (COLACE) 100 MG capsule TAKE 1 CAPSULE (100 MG TOTAL) BY MOUTH 2 (TWO) TIMES DAILY AS NEEDED FOR MILD CONSTIPATION. 180 capsule 3   empagliflozin (JARDIANCE) 25 MG TABS tablet Take 1 tablet (25 mg total) by mouth daily before breakfast. 90 tablet 1  ferrous sulfate 324 MG TBEC Take 1 tablet (324 mg total) by mouth daily with breakfast. 30 tablet 11   gabapentin (NEURONTIN) 300 MG capsule TAKE 1 CAPSULE BY MOUTH THREE TIMES A DAY 270 capsule 3   glucose blood (ACCU-CHEK GUIDE) test strip Use as instructed up to four times daily as directed. 100 each 12   metFORMIN (GLUCOPHAGE-XR) 500 MG 24 hr tablet Take 1 tablet (500 mg total) by mouth 2 (two) times daily with a meal. 180 tablet 3   Misc. Devices (ROLLATOR ULTRA-LIGHT) MISC 1 each by Does not apply route daily. 1 each 0    nitroGLYCERIN (NITROSTAT) 0.4 MG SL tablet Place 1 tablet (0.4 mg total) under the tongue every 5 (five) minutes x 3 doses as needed for chest pain. 25 tablet 2   rosuvastatin (CRESTOR) 40 MG tablet TAKE 1 TABLET BY MOUTH EVERY DAY 90 tablet 3   traZODone (DESYREL) 50 MG tablet Take 0.5-1 tablets (25-50 mg total) by mouth at bedtime as needed for sleep. 30 tablet 3   No current facility-administered medications for this visit.    SURGICAL HISTORY:  Past Surgical History:  Procedure Laterality Date   CARDIAC CATHETERIZATION N/A 09/05/2015   Procedure: Left Heart Cath and Coronary Angiography;  Surgeon: Troy Sine, MD;  Location: [facility];  Service: Cardiovascular;  Laterality: N/A;   CARDIAC CATHETERIZATION N/A 09/05/2015   Procedure: Coronary Stent Intervention;  Surgeon: Troy Sine, MD;  Location: [facility];  Service: Cardiovascular;  Laterality: N/A;   IR IMAGING GUIDED PORT INSERTION  07/15/2019   ORIF CONGENITAL HIP DISLOCATION Left ~ 1965   "put pins in it"   PERIPHERAL VASCULAR CATHETERIZATION Bilateral 2003   "stenting"   TONSILLECTOMY  ~ [place]:  A comprehensive review of systems was negative.   PHYSICAL EXAMINATION: General appearance: alert, cooperative, appears stated age, and no distress Head: Normocephalic, without obvious abnormality, atraumatic Neck: no adenopathy, no JVD, supple, symmetrical, trachea midline, and thyroid not enlarged, symmetric, no tenderness/mass/nodules Lymph nodes: Cervical, supraclavicular, and axillary nodes normal. Resp: clear to auscultation bilaterally Back: symmetric, no curvature. ROM normal. No CVA tenderness. Cardio: regular rate and rhythm, S1, S2 normal, no murmur, click, rub or gallop GI: soft, non-tender; bowel sounds normal; no masses,  no organomegaly Extremities: extremities normal, atraumatic, no cyanosis or edema  ECOG PERFORMANCE STATUS: 1 - Symptomatic but completely  ambulatory  Blood pressure (!) 150/57, pulse 71, temperature 98 F (36.7 C), temperature source Oral, resp. rate 19, weight 230 lb 7 oz (104.5 kg), SpO2 98 %.  LABORATORY DATA: Lab Results  Component Value Date   WBC 6.0 07/16/2022   HGB 13.8 07/16/2022   HCT 41.3 07/16/2022   MCV 88.8 07/16/2022   PLT 266 07/16/2022      Chemistry      Component Value Date/Time   NA 138 07/16/2022 1328   NA 140 06/08/2015 0840   K 4.0 07/16/2022 1328   CL 104 07/16/2022 1328   CO2 28 07/16/2022 1328   BUN 27 (H) 07/16/2022 1328   BUN 15 06/08/2015 0840   CREATININE 1.54 (H) 07/16/2022 1328   CREATININE 1.06 10/01/2016 0855      Component Value Date/Time   CALCIUM 9.7 07/16/2022 1328   ALKPHOS 69 07/16/2022 1328   AST 20 07/16/2022 1328   ALT 22 07/16/2022 1328   BILITOT 0.4 07/16/2022 1328       RADIOGRAPHIC STUDIES: CT Chest Wo  Contrast  Result Date: 07/17/2022 CLINICAL DATA:  Non-small cell lung cancer, stage IV. * Tracking Code: BO * EXAM: CT CHEST, ABDOMEN AND PELVIS WITHOUT CONTRAST TECHNIQUE: Multidetector CT imaging of the chest, abdomen and pelvis was performed following the standard protocol without IV contrast. RADIATION DOSE REDUCTION: This exam was performed according to the departmental dose-optimization program which includes automated exposure control, adjustment of the mA and/or kV according to patient size and/or use of iterative reconstruction technique. COMPARISON:  04/13/2022 and 03/16/2020.  PET 06/07/2019. FINDINGS: CT CHEST FINDINGS Cardiovascular: Right IJ Port-A-Cath terminates at the SVC RA junction. Atherosclerotic calcification of the aorta, aortic valve and coronary arteries. Enlarged pulmonic trunk. Heart size normal. No pericardial effusion. Mediastinum/Nodes: No pathologically enlarged mediastinal or axillary lymph nodes. Hilar regions are difficult to definitively evaluate without IV contrast. Esophagus is grossly unremarkable. Lungs/Pleura: Centrilobular  emphysema. Scarring in the right middle lobe and both lower lobes. No suspicious pulmonary nodules. No pleural fluid. Airway is unremarkable. Musculoskeletal: Degenerative changes in the spine. No worrisome lytic or sclerotic lesions. CT ABDOMEN PELVIS FINDINGS Hepatobiliary: Minimally hypodense lesion in the inferior right hepatic lobe measures 1.6 x 2.6 cm (504/66) and is present dating back to PET 06/07/2019, at which time it was not hypermetabolic. Findings indicate a benign lesion such as a hemangioma. Liver and gallbladder are otherwise unremarkable. No biliary ductal dilatation. Pancreas: Negative. Spleen: Negative. Adrenals/Urinary Tract: Adrenal glands and kidneys are unremarkable. Ureters are decompressed. Bladder is grossly unremarkable. Stomach/Bowel: Stomach, small bowel, appendix and colon are unremarkable. Vascular/Lymphatic: Atherosclerotic calcification of the aorta. Bilateral common iliac artery stents. Abdominal peritoneal ligament lymph nodes measure up to 9 mm in the gastrohepatic ligament, similar. Retroperitoneal lymph nodes are not enlarged by CT size criteria. Small mesenteric lymph nodes and mild haziness, unchanged. Reproductive: Prostate is normal in size. Other: No free fluid. Mesenteries and peritoneum are otherwise unremarkable. Musculoskeletal: Pins are seen in the proximal left femur. Degenerative changes in the spine. No worrisome lytic or sclerotic lesions. Degenerative changes in the right hip. IMPRESSION: 1. No evidence of recurrent or metastatic disease. 2. Aortic atherosclerosis (ICD10-I70.0). Coronary artery calcification. 3. Enlarged pulmonic trunk, indicative of pulmonary arterial hypertension. 4.  Emphysema (ICD10-J43.9). Electronically Signed   By: Lorin Picket M.D.   On: 07/17/2022 10:51   CT Abdomen Pelvis Wo Contrast  Result Date: 07/17/2022 CLINICAL DATA:  Non-small cell lung cancer, stage IV. * Tracking Code: BO * EXAM: CT CHEST, ABDOMEN AND PELVIS WITHOUT  CONTRAST TECHNIQUE: Multidetector CT imaging of the chest, abdomen and pelvis was performed following the standard protocol without IV contrast. RADIATION DOSE REDUCTION: This exam was performed according to the departmental dose-optimization program which includes automated exposure control, adjustment of the mA and/or kV according to patient size and/or use of iterative reconstruction technique. COMPARISON:  04/13/2022 and 03/16/2020.  PET 06/07/2019. FINDINGS: CT CHEST FINDINGS Cardiovascular: Right IJ Port-A-Cath terminates at the SVC RA junction. Atherosclerotic calcification of the aorta, aortic valve and coronary arteries. Enlarged pulmonic trunk. Heart size normal. No pericardial effusion. Mediastinum/Nodes: No pathologically enlarged mediastinal or axillary lymph nodes. Hilar regions are difficult to definitively evaluate without IV contrast. Esophagus is grossly unremarkable. Lungs/Pleura: Centrilobular emphysema. Scarring in the right middle lobe and both lower lobes. No suspicious pulmonary nodules. No pleural fluid. Airway is unremarkable. Musculoskeletal: Degenerative changes in the spine. No worrisome lytic or sclerotic lesions. CT ABDOMEN PELVIS FINDINGS Hepatobiliary: Minimally hypodense lesion in the inferior right hepatic lobe measures 1.6 x 2.6 cm (504/66) and is present  dating back to PET 06/07/2019, at which time it was not hypermetabolic. Findings indicate a benign lesion such as a hemangioma. Liver and gallbladder are otherwise unremarkable. No biliary ductal dilatation. Pancreas: Negative. Spleen: Negative. Adrenals/Urinary Tract: Adrenal glands and kidneys are unremarkable. Ureters are decompressed. Bladder is grossly unremarkable. Stomach/Bowel: Stomach, small bowel, appendix and colon are unremarkable. Vascular/Lymphatic: Atherosclerotic calcification of the aorta. Bilateral common iliac artery stents. Abdominal peritoneal ligament lymph nodes measure up to 9 mm in the gastrohepatic  ligament, similar. Retroperitoneal lymph nodes are not enlarged by CT size criteria. Small mesenteric lymph nodes and mild haziness, unchanged. Reproductive: Prostate is normal in size. Other: No free fluid. Mesenteries and peritoneum are otherwise unremarkable. Musculoskeletal: Pins are seen in the proximal left femur. Degenerative changes in the spine. No worrisome lytic or sclerotic lesions. Degenerative changes in the right hip. IMPRESSION: 1. No evidence of recurrent or metastatic disease. 2. Aortic atherosclerosis (ICD10-I70.0). Coronary artery calcification. 3. Enlarged pulmonic trunk, indicative of pulmonary arterial hypertension. 4.  Emphysema (ICD10-J43.9). Electronically Signed   By: Lorin Picket M.D.   On: 07/17/2022 10:51    ASSESSMENT AND PLAN: This is a very pleasant 67 years old African-American male recently diagnosed with a stage IV (TX, N3, M1c) non-small cell lung cancer, poorly differentiated adenocarcinoma presented with bulky mediastinal as well as right hilar, supraclavicular, abdominal and right axillary lymphadenopathy in addition to metastatic liver lesions diagnosed in January 2021. Molecular studies by guardant 360 shows no actionable mutations.  The patient is not a candidate for treatment with targeted therapy or enrollment in the clinical trial with the [facility] regimen. The patient is currently undergoing systemic chemotherapy with carboplatin for AUC of 5, Alimta 500 mg/M2 and Keytruda 200 mg IV every 3 weeks status post 35 cycles. Starting from cycle #5 he is treated with maintenance Alimta and Keytruda every 3 weeks.  Starting from cycle #12 the patient has been on treatment with single agent Keytruda because of the renal insufficiency. The patient completed 2 years of treatment with immunotherapy. The patient is currently on observation and he is feeling fine with no concerning complaints. He had repeat CT scan of the chest, abdomen and pelvis performed recently.  I  personally and independently reviewed the scan and discussed the result with the patient today.  His scan showed no concerning findings for disease recurrence or progression. I recommended for him to continue on observation with repeat CT scan of the chest, abdomen and pelvis in 4 months. For the anemia of chronic disease/iron deficiency, he has significant improvement in his hemoglobin and hematocrit with the iron infusion. For the renal insufficiency he is followed by nephrology. The patient will have a Port-A-Cath flush every 2 months. The patient was advised to call immediately if he has any other concerning symptoms in the interval. The patient voices understanding of current disease status and treatment options and is in agreement with the current care plan.  All questions were answered. The patient knows to call the clinic with any problems, questions or concerns. We can certainly see the patient much sooner if necessary.  Disclaimer: This note was dictated with voice recognition software. Similar sounding words can inadvertently be transcribed and may not be corrected upon review.

## 2022-07-28 ENCOUNTER — Other Ambulatory Visit: Payer: Self-pay | Admitting: Nurse Practitioner

## 2022-07-28 DIAGNOSIS — E1151 Type 2 diabetes mellitus with diabetic peripheral angiopathy without gangrene: Secondary | ICD-10-CM

## 2022-08-01 ENCOUNTER — Inpatient Hospital Stay: Payer: Medicare Other | Attending: Internal Medicine

## 2022-08-01 DIAGNOSIS — C787 Secondary malignant neoplasm of liver and intrahepatic bile duct: Secondary | ICD-10-CM | POA: Insufficient documentation

## 2022-08-01 DIAGNOSIS — Z95828 Presence of other vascular implants and grafts: Secondary | ICD-10-CM

## 2022-08-01 DIAGNOSIS — C3490 Malignant neoplasm of unspecified part of unspecified bronchus or lung: Secondary | ICD-10-CM | POA: Insufficient documentation

## 2022-08-01 MED ORDER — SODIUM CHLORIDE 0.9% FLUSH
10.0000 mL | Freq: Once | INTRAVENOUS | Status: AC
  Administered 2022-08-01: 10 mL

## 2022-08-01 MED ORDER — HEPARIN SOD (PORK) LOCK FLUSH 100 UNIT/ML IV SOLN
500.0000 [IU] | Freq: Once | INTRAVENOUS | Status: AC
  Administered 2022-08-01: 500 [IU]

## 2022-08-02 ENCOUNTER — Other Ambulatory Visit: Payer: Self-pay | Admitting: Nurse Practitioner

## 2022-08-02 DIAGNOSIS — G4700 Insomnia, unspecified: Secondary | ICD-10-CM

## 2022-08-12 ENCOUNTER — Telehealth: Payer: Self-pay | Admitting: Nurse Practitioner

## 2022-08-12 NOTE — Telephone Encounter (Signed)
Pt is asking for a call back in regards to his meter!

## 2022-08-15 NOTE — Telephone Encounter (Signed)
Will discuss with patient at upcoming appointment next week.    Catie

## 2022-08-22 ENCOUNTER — Other Ambulatory Visit: Payer: Self-pay | Admitting: Nurse Practitioner

## 2022-08-22 ENCOUNTER — Other Ambulatory Visit: Payer: Medicare Other | Admitting: Pharmacist

## 2022-08-22 MED ORDER — FREESTYLE LIBRE 3 READER DEVI: 1.0000 | Freq: Once | 0 refills | Status: DC

## 2022-08-22 MED ORDER — FREESTYLE LIBRE 3 SENSOR MISC: 11 refills | Status: DC

## 2022-08-22 NOTE — Progress Notes (Signed)
08/22/2022 Name: Kirk [name] MRN: [id number] DOB: 12/20/1955  Chief Complaint  Patient presents with   Medication Management   Diabetes   Hypertension    Kirk [name] is a 67 y.o. year old male who presented for a telephone visit.   They were referred to the pharmacist by their PCP for assistance in managing diabetes.   Subjective:  Care Team: Primary Care Provider: Fenton Foy, NP ; Next Scheduled Visit: 10/10/22  Medication Access/Adherence  Current Pharmacy:  [id number] [address], [name] [address] Phone: [phone] Fax: [place], [name] - [address] [name] [address] Phone: [phone] Fax: [phone]   Patient reports affordability concerns with their medications: No  Patient reports access/transportation concerns to their pharmacy: No  Patient reports adherence concerns with their medications:  No     Diabetes:  Current medications: metformin XR 500 mg twice daily, Jardiance 25 mg daily; patient reports he restarted glimepiride 2 mg daily due to higher blood sugars.   Patient denies hypoglycemic s/sx including dizziness, shakiness, sweating.   Notes today that he is interested in CGM. Reports if he cannot get Elenor Legato, he needs a new script for test strips.   Objective:  Lab Results  Component Value Date   HGBA1C 7.6 (A) 07/11/2022    Lab Results  Component Value Date   CREATININE 1.54 (H) 07/16/2022   BUN 27 (H) 07/16/2022   NA 138 07/16/2022   K 4.0 07/16/2022   CL 104 07/16/2022   CO2 28 07/16/2022    Lab Results  Component Value Date   CHOL 124 07/11/2022   HDL 30 (L) 07/11/2022   LDLCALC 61 07/11/2022   TRIG 195 (H) 07/11/2022   CHOLHDL 4.1 07/11/2022    Medications Reviewed Today     Reviewed by Osker Mason, RPH-CPP (Pharmacist) on 08/22/22 at 1637  Med List Status: <None>    Medication Order Taking? Sig Documenting Provider Last Dose Status Informant  ACCU-CHEK GUIDE test strip [id number]  USE AS INSTRUCTED UP TO FOUR TIMES DAILY AS DIRECTED. Fenton Foy, NP  Active   acetaminophen (TYLENOL) 500 MG tablet [id number]  Take 500 mg by mouth every 6 (six) hours as needed for moderate pain or fever.  [provider]  Active Self           Med Note Elyse Jarvis   Thu Jul 11, 2022 10:21 AM) Prn   aspirin EC 81 MG tablet [id number]  Take 81 mg by mouth at bedtime. [provider]  Active Self  benzonatate (TESSALON) 200 MG capsule [id number]  Take 1 capsule (200 mg total) by mouth 2 (two) times daily as needed for cough. Fenton Foy, NP  Active   Blood Glucose Monitoring Suppl (ACCU-CHEK GUIDE) w/Device Drucie Opitz [id number]  USE UP TO FOUR TIMES DAILY AS DIRECTED. (FOR ICD-10 E10.9, E11.9). Fenton Foy, NP  Active   carvedilol (COREG) 12.5 MG tablet [id number]  TAKE 1 TABLET (12.5 MG TOTAL) BY MOUTH 2 (TWO) TIMES DAILY WITH A MEAL. Fenton Foy, NP  Active   clopidogrel (PLAVIX) 75 MG tablet [id number]  TAKE 1 TABLET BY MOUTH EVERY DAY Fenton Foy, NP  Active   Continuous Blood Gluc Receiver (FREESTYLE LIBRE 3 READER) DEVI [id number] Yes 1 each by Does not apply route once for 1 dose. Use to check glucose continuously [name]  S, NP  Active   Continuous Blood Gluc Sensor (FREESTYLE LIBRE 3 SENSOR) MISC [id number] Yes Place 1 sensor on the skin every 14 days. Use to check glucose continuously Fenton Foy, NP  Active   docusate sodium (COLACE) 100 MG capsule [id number]  TAKE 1 CAPSULE (100 MG TOTAL) BY MOUTH 2 (TWO) TIMES DAILY AS NEEDED FOR MILD CONSTIPATION. Fenton Foy, NP  Active            Med Note Elyse Jarvis   Thu Jul 11, 2022 10:21 AM) Prn   empagliflozin (JARDIANCE) 25 MG TABS tablet [id number] Yes Take 1 tablet (25 mg total) by mouth daily before breakfast. Fenton Foy, NP Taking Active   ferrous sulfate 324 MG  TBEC [id number]  Take 1 tablet (324 mg total) by mouth daily with breakfast. [name], FNP  Active   gabapentin (NEURONTIN) 300 MG capsule [id number]  TAKE 1 CAPSULE BY MOUTH THREE TIMES A DAY Fenton Foy, NP  Active   glimepiride (AMARYL) 2 MG tablet [id number] Yes Take 2 mg by mouth daily with breakfast. [provider] Taking Active   metFORMIN (GLUCOPHAGE-XR) 500 MG 24 hr tablet [id number] Yes Take 1 tablet (500 mg total) by mouth 2 (two) times daily with a meal. Fenton Foy, NP Taking Active   Misc. Devices (ROLLATOR [place]) MISC [id number]  1 each by Does not apply route daily. Fenton Foy, NP  Active   nitroGLYCERIN (NITROSTAT) 0.4 MG SL tablet [id number]  Place 1 tablet (0.4 mg total) under the tongue every 5 (five) minutes x 3 doses as needed for chest pain. Elgie Collard, [place]  Active   rosuvastatin (CRESTOR) 40 MG tablet [id number]  TAKE 1 TABLET BY MOUTH EVERY DAY Fenton Foy, NP  Active   traZODone (DESYREL) 50 MG tablet [id number]  TAKE 0.5-1 TABLETS BY MOUTH AT BEDTIME AS NEEDED FOR SLEEP. Fenton Foy, NP  Active               Assessment/Plan:   Diabetes: - Currently uncontrolled - Reviewed long term cardiovascular and renal outcomes of uncontrolled blood sugar - Reviewed goal A1c, goal fasting, and goal 2 hour post prandial glucose - Script sent for [facility] to see if covered (under systemwide standing order). Contacted pharmacy. Required PA. PA was denied. Sending updated script for test strips    Follow Up Plan: phone call in 4 weeks  Catie TJodi Mourning, PharmD, [place], [facility] [phone]

## 2022-08-23 ENCOUNTER — Other Ambulatory Visit: Payer: Self-pay | Admitting: Nurse Practitioner

## 2022-08-23 DIAGNOSIS — E1151 Type 2 diabetes mellitus with diabetic peripheral angiopathy without gangrene: Secondary | ICD-10-CM

## 2022-08-26 ENCOUNTER — Other Ambulatory Visit: Payer: Self-pay | Admitting: Nurse Practitioner

## 2022-08-26 DIAGNOSIS — E1151 Type 2 diabetes mellitus with diabetic peripheral angiopathy without gangrene: Secondary | ICD-10-CM

## 2022-08-26 MED ORDER — ACCU-CHEK GUIDE VI STRP: ORAL_STRIP | 12 refills | Status: DC

## 2022-08-26 NOTE — Addendum Note (Signed)
Addended byVonzell Schlatter on: 08/26/2022 10:28 AM   Modules accepted: Orders

## 2022-08-27 MED ORDER — BLOOD GLUCOSE TEST VI STRP: 1.0000 | ORAL_STRIP | Freq: Three times a day (TID) | 3 refills | Status: AC

## 2022-08-27 NOTE — Patient Instructions (Signed)
Check your blood sugars twice daily:  1) Fasting, first thing in the morning before breakfast and  2) 2 hours after your largest meal.   For a goal A1c of less than 7%, goal fasting readings are less than 130 and goal 2 hour after meal readings are less than 180.

## 2022-09-04 ENCOUNTER — Other Ambulatory Visit: Payer: Self-pay | Admitting: Nurse Practitioner

## 2022-09-04 ENCOUNTER — Ambulatory Visit (INDEPENDENT_AMBULATORY_CARE_PROVIDER_SITE_OTHER): Payer: Medicare Other | Admitting: Gastroenterology

## 2022-09-04 ENCOUNTER — Encounter: Payer: Self-pay | Admitting: Gastroenterology

## 2022-09-04 VITALS — BP 130/74 | HR 74 | Ht 75.0 in | Wt 225.0 lb

## 2022-09-04 DIAGNOSIS — R1310 Dysphagia, unspecified: Secondary | ICD-10-CM

## 2022-09-04 DIAGNOSIS — Z86010 Personal history of colonic polyps: Secondary | ICD-10-CM | POA: Diagnosis not present

## 2022-09-04 DIAGNOSIS — C3490 Malignant neoplasm of unspecified part of unspecified bronchus or lung: Secondary | ICD-10-CM

## 2022-09-04 NOTE — Patient Instructions (Addendum)
You will be due for a recall colonoscopy in 04-2024. We will send you a reminder in the mail when it gets closer to that time.     If your blood pressure at your visit was 140/90 or greater, please contact your primary care physician to follow up on this.  _______________________________________________________  If you are age 69 or older, your body mass index should be between 23-30. Your Body mass index is 28.12 kg/m. If this is out of the aforementioned range listed, please consider follow up with your Primary Care Provider.  If you are age 35 or younger, your body mass index should be between 19-25. Your Body mass index is 28.12 kg/m. If this is out of the aformentioned range listed, please consider follow up with your Primary Care Provider.   ________________________________________________________  The [facility] providers would like to encourage you to use [facility] to communicate with providers for non-urgent requests or questions.  Due to long hold times on the telephone, sending your provider a message by [facility] may be a faster and more efficient way to get a response.  Please allow 48 business hours for a response.  Please remember that this is for non-urgent requests.  _______________________________________________________  Due to recent changes in healthcare laws, you may see the results of your imaging and laboratory studies on MyChart before your provider has had a chance to review them.  We understand that in some cases there may be results that are confusing or concerning to you. Not all laboratory results come back in the same time frame and the provider may be waiting for multiple results in order to interpret others.  Please give [place] 48 hours in order for your provider to thoroughly review all the results before contacting the office for clarification of your results.    Thank you for entrusting me with your care and for choosing [facility], Dr. Ileene Patrick

## 2022-09-04 NOTE — Progress Notes (Signed)
HPI :   67 year old male with a history of CAD on aspirin and Plavix, history of colon polyps, history of stage IV non-small cell lung cancer, here to reestablish care and discuss surveillance colonoscopy.  Referred by Angus Selleronya Nichols NP.  The last time he was seen here was in December 2020.  Recall in December 2020 I saw him for symptoms of dysphagia.  He underwent an EGD with me which showed luminal narrowing of his esophagus due to extrinsic compression.  We obtained a CT scan of his chest which showed extensive lymphadenopathy and liver lesions concerning for metastatic disease.  This was followed up with a PET scan and ultimately seen by oncology and diagnosed with stage IV non-small cell lung cancer.  He has been through an extensive course of chemotherapy and immunotherapy since his diagnosis.  Fortunately with treatment he states his dysphagia resolved.  He has no problems eating, in fact is eating great and is gaining weight.  He is done with his chemotherapy and immunotherapy as far as he understands, still has a port in place, he was told he does not have curable disease but things appear to be stable based on most recent CT scan of his chest abdomen pelvis.  At the time of his colonoscopy he had 3 small adenomas removed.  He denies any problems with his bowels at baseline not really bother him too much.  He does take an occasional over-the-counter laxative as needed for some constipation.  No blood in his stools.  He otherwise denies any other complaints other than some balance issues he has had from chemotherapy.  He denies any family history of colon cancer or esophageal cancer.     Prior workup: EGD 05/17/2019: - Esophagogastric landmarks were identified: the Z-line was found at 45 cm, the gastroesophageal junction was found at 45 cm and the upper extent of the gastric folds was found at 45 cm from the incisors. Findings: - An area of extrinsic compression was found in the mid to lower  esophagus from 30cm to 35cm from the incisors, narrowing the esophageal lumen. The scope was able traverse the area. The overlying mucosa appeared normal, biopsies not obtained. - The exam of the esophagus was otherwise normal. - The entire examined stomach was normal. - A single medium angiodysplastic lesion was found in the second portion of the duodenum. - The exam of the duodenum was otherwise normal.   Colonoscopy 05/17/2019: - The perianal and digital rectal examinations were normal. - Three sessile polyps were found in the transverse colon. The polyps were 3 to 4 mm in size. These polyps were removed with a cold snare. Resection and retrieval were complete. - Internal hemorrhoids were found during retroflexion. The hemorrhoids were moderate. - The exam was otherwise without abnormality  Surgical [P], colon, transverse, polyp (3) - TUBULAR ADENOMA(S). - NO HIGH GRADE DYPLASIA OR MALIGNANCY.   CT scan abdomen / pelvis / chest 07/17/22: IMPRESSION: 1. No evidence of recurrent or metastatic disease. 2. Aortic atherosclerosis (ICD10-I70.0). Coronary artery calcification. 3. Enlarged pulmonic trunk, indicative of pulmonary arterial hypertension. 4.  Emphysema (ICD10-J43.9).    Lab Results  Component Value Date   WBC 6.0 07/16/2022   HGB 13.8 07/16/2022   HCT 41.3 07/16/2022   MCV 88.8 07/16/2022   PLT 266 07/16/2022    Lab Results  Component Value Date   CREATININE 1.54 (H) 07/16/2022   BUN 27 (H) 07/16/2022   NA 138 07/16/2022   K 4.0 07/16/2022  CL 104 07/16/2022   CO2 28 07/16/2022      Past Medical History:  Diagnosis Date   Arthritis    "right knee" (09/04/2015)   Atrial flutter with rapid ventricular response 04/19/2019   Dizziness 10/2019   Dyspnea    increased exertion   Elevated serum creatinine 07/2020   HTN (hypertension)    Hyperlipidemia    Kidney disease    stage 3B   Lung cancer 05/2019   Microalbuminuria 07/2020   NSTEMI (non-ST elevated  myocardial infarction) 09/04/2015   a. cath 09/05/2015: 95% stenosis mid-LAD, 55% mid-RCA, and 40% prox-RCA. PCI performed w/ Synergy DES to LAD   PAD (peripheral artery disease)    Stenting of bilateral iliacs in 2003.   Refusal of blood transfusions as patient is Jehovah's Witness    Type II diabetes mellitus    Vitamin D deficiency 07/2020     Past Surgical History:  Procedure Laterality Date   CARDIAC CATHETERIZATION N/A 09/05/2015   Procedure: Left Heart Cath and Coronary Angiography;  Surgeon: Lennette Bihari, MD;  Location: [facility];  Service: Cardiovascular;  Laterality: N/A;   CARDIAC CATHETERIZATION N/A 09/05/2015   Procedure: Coronary Stent Intervention;  Surgeon: Lennette Bihari, MD;  Location: [facility];  Service: Cardiovascular;  Laterality: N/A;   IR IMAGING GUIDED PORT INSERTION  07/15/2019   ORIF CONGENITAL HIP DISLOCATION Left ~ 1965   "put pins in it"   PERIPHERAL VASCULAR CATHETERIZATION Bilateral 2003   "stenting"   TONSILLECTOMY  ~ 1963   Family History  Problem Relation Age of Onset   Lymphoma Mother    Prostate cancer Father    CAD Neg Hx    Colon cancer Neg Hx    Rectal cancer Neg Hx    Stomach cancer Neg Hx    Esophageal cancer Neg Hx    Social History   Tobacco Use   Smoking status: Former    Packs/day: 2.00    Years: 35.00    Additional pack years: 0.00    Total pack years: 70.00    Types: Cigarettes    Quit date: 02/19/2003    Years since quitting: 19.5   Smokeless tobacco: Never  Vaping Use   Vaping Use: Never used  Substance Use Topics   Alcohol use: Not Currently    Alcohol/week: 0.0 standard drinks of alcohol   Drug use: Not Currently    Types: Marijuana   Current Outpatient Medications  Medication Sig Dispense Refill   acetaminophen (TYLENOL) 500 MG tablet Take 500 mg by mouth every 6 (six) hours as needed for moderate pain or fever.      aspirin EC 81 MG tablet Take 81 mg by mouth at bedtime.     benzonatate  (TESSALON) 200 MG capsule Take 1 capsule (200 mg total) by mouth 2 (two) times daily as needed for cough. 20 capsule 0   Blood Glucose Monitoring Suppl (ACCU-CHEK GUIDE) w/Device KIT USE UP TO FOUR TIMES DAILY AS DIRECTED. (FOR ICD-10 E10.9, E11.9). 1 kit 0   carvedilol (COREG) 12.5 MG tablet TAKE 1 TABLET (12.5 MG TOTAL) BY MOUTH 2 (TWO) TIMES DAILY WITH A MEAL. 180 tablet 3   clopidogrel (PLAVIX) 75 MG tablet TAKE 1 TABLET BY MOUTH EVERY DAY 90 tablet 3   docusate sodium (COLACE) 100 MG capsule TAKE 1 CAPSULE (100 MG TOTAL) BY MOUTH 2 (TWO) TIMES DAILY AS NEEDED FOR MILD CONSTIPATION. 180 capsule 3   empagliflozin (JARDIANCE) 25 MG TABS  tablet Take 1 tablet (25 mg total) by mouth daily before breakfast. 90 tablet 1   ferrous sulfate 324 MG TBEC Take 1 tablet (324 mg total) by mouth daily with breakfast. 30 tablet 11   gabapentin (NEURONTIN) 300 MG capsule TAKE 1 CAPSULE BY MOUTH THREE TIMES A DAY 270 capsule 3   glimepiride (AMARYL) 2 MG tablet TAKE 1 TABLET BY MOUTH DAILY BEFORE BREAKFAST. 90 tablet 0   Glucose Blood (BLOOD GLUCOSE TEST STRIPS) STRP 1 each by In Vitro route 3 (three) times daily with meals. May substitute to any manufacturer covered by patient's insurance. 100 strip 3   metFORMIN (GLUCOPHAGE-XR) 500 MG 24 hr tablet Take 1 tablet (500 mg total) by mouth 2 (two) times daily with a meal. 180 tablet 3   Misc. Devices (ROLLATOR ULTRA-LIGHT) MISC 1 each by Does not apply route daily. 1 each 0   nitroGLYCERIN (NITROSTAT) 0.4 MG SL tablet Place 1 tablet (0.4 mg total) under the tongue every 5 (five) minutes x 3 doses as needed for chest pain. 25 tablet 2   rosuvastatin (CRESTOR) 40 MG tablet TAKE 1 TABLET BY MOUTH EVERY DAY 90 tablet 3   traZODone (DESYREL) 50 MG tablet TAKE 0.5-1 TABLETS BY MOUTH AT BEDTIME AS NEEDED FOR SLEEP. 90 tablet 2   No current facility-administered medications for this visit.   No Known Allergies   Review of Systems: All systems reviewed and negative  except where noted in HPI.   Labs per HPI  Physical Exam: BP 130/74   Pulse 74   Ht  (1.905 m)   Wt 225 lb (102.1 kg)   BMI 28.12 kg/m  Constitutional: Pleasant,well-developed, male in no acute distress. HEENT: Normocephalic and atraumatic. Conjunctivae are normal. No scleral icterus. Neck supple.  Cardiovascular: Normal rate, regular rhythm.  Pulmonary/chest: Effort normal and breath sounds normal. No wheezing, rales or rhonchi. Abdominal: Soft, nondistended, nontender.  There are no masses palpable. No hepatomegaly. Extremities: no edema Neurological: Alert and oriented to person place and time. Skin: Skin is warm and dry. No rashes noted. Psychiatric: Normal mood and affect. Behavior is normal.   ASSESSMENT: 67 y.o. male here for assessment of the following  1. History of colon polyps   2. Dysphagia, unspecified type   3. Metastatic non-small cell lung cancer    History of metastatic lung cancer causing dysphagia when I saw him in 2020.  He is status postchemotherapy and immunotherapy with much better control of disease and is stable at this time.  Recent CT imaging as above.  His dysphagia has resolved.  He had 3 small adenomas removed at his last colonoscopy.  At that point in time based on current national surveillance guidelines I had recommended a repeat colonoscopy to be done in 3 years.  Based on updated surveillance guidelines which I reviewed with him, for 3 small adenomas we could extend his surveillance to 5 years from his last exam.  Given the polyps are small I think that is reasonable (next due December 2025).  We discussed he can see me at that time to determine if he wishes to have further surveillance colonoscopies pending the status of his malignancy and how active that is etc.  For now, I feel we can hold off and postpone this decision until at least December 2025 based on updated surveillance guidelines.  He is agreeable with this.   PLAN: - change  surveillance colonoscopy to 04/2024 if he is in good health, will discuss at that  time howe aggressive he wishes to be with surveillance exams. Call in the interim if symptoms - dysphagia resolved with treatment of his cancer - f/u PRN in the interim  Harlin Rain, MD [facility]  CC: Ivonne Andrew, NP

## 2022-09-12 ENCOUNTER — Other Ambulatory Visit: Payer: Self-pay

## 2022-09-12 ENCOUNTER — Inpatient Hospital Stay: Payer: Medicare Other | Attending: Internal Medicine

## 2022-09-12 DIAGNOSIS — D509 Iron deficiency anemia, unspecified: Secondary | ICD-10-CM | POA: Insufficient documentation

## 2022-09-12 DIAGNOSIS — Z85118 Personal history of other malignant neoplasm of bronchus and lung: Secondary | ICD-10-CM | POA: Insufficient documentation

## 2022-09-12 DIAGNOSIS — Z95828 Presence of other vascular implants and grafts: Secondary | ICD-10-CM

## 2022-09-12 MED ORDER — SODIUM CHLORIDE 0.9% FLUSH
10.0000 mL | Freq: Once | INTRAVENOUS | Status: AC
  Administered 2022-09-12: 10 mL

## 2022-09-12 MED ORDER — HEPARIN SOD (PORK) LOCK FLUSH 100 UNIT/ML IV SOLN
500.0000 [IU] | Freq: Once | INTRAVENOUS | Status: AC
  Administered 2022-09-12: 500 [IU]

## 2022-09-17 ENCOUNTER — Telehealth: Payer: Self-pay | Admitting: Internal Medicine

## 2022-09-19 ENCOUNTER — Inpatient Hospital Stay: Payer: Medicare Other

## 2022-09-25 ENCOUNTER — Other Ambulatory Visit: Payer: Medicare Other | Admitting: Pharmacist

## 2022-10-02 ENCOUNTER — Other Ambulatory Visit: Payer: Medicare Other | Admitting: Pharmacist

## 2022-10-02 NOTE — Patient Instructions (Signed)
Mr Pantoja,   It was great talking to you today!  Continue your current regimen.   Check your blood sugars twice daily:  1) Fasting, first thing in the morning before breakfast and  2) 2 hours after your largest meal.   For a goal A1c of less than 7%, goal fasting readings are less than 130 and goal 2 hour after meal readings are less than 180.    Bring any home blood pressure readings to your next visit with Tonya.   Catie Eppie Gibson, PharmD, BCACP, CPP [facility] [phone]

## 2022-10-02 NOTE — Progress Notes (Signed)
10/02/2022 Name: LETHA LUNDIE MRN: [id number] DOB: [birth year]  Chief Complaint  Patient presents with   Medication Management   Hypertension   Diabetes   Hyperlipidemia    TAKAO GRANADOS is a 67 y.o. year old male who presented for a telephone visit.   They were referred to the pharmacist by their PCP for assistance in managing diabetes, hypertension, and hyperlipidemia.   Subjective:  Care Team: Primary Care Provider: Ivonne Andrew, NP ; Next Scheduled Visit: 10/10/22  Medication Access/Adherence  Current Pharmacy:  [id number] [place],  - [address] [place] [address] Phone: [phone] Fax: [phone]  [facility] - [place], [place] - [address] [place] [address] Phone: [phone] Fax: [phone]   Patient reports affordability concerns with their medications: No  Patient reports access/transportation concerns to their pharmacy: No  Patient reports adherence concerns with their medications:  No     Diabetes:  Current medications: metformin XR 500 mg twice daily, Jardiance 25 mg daily, glimepiride 2 mg daily   Current glucose readings: fastings: 140s; 2 hour after meals - 80-90s  Patient denies hypoglycemic s/sx including dizziness, shakiness, sweating. Patient denies hyperglycemic symptoms including polyuria, polydipsia, polyphagia, nocturia, neuropathy, blurred vision.  Current medication access support: over income for assistance for SGLT2 or GLP1.   Hypertension:  Current medications: carvedilol 12.5 mg twice daily Medications previously tried: lisinopril - hx AKI  Patient has a validated, automated, upper arm home BP cuff   Hyperlipidemia/ASCVD Risk Reduction  Current lipid lowering medications: rosuvastatin 40 mg daily  Antiplatelet regimen: aspirin 81 mg daily + clopidogrel 75 mg daily    Objective:  Lab Results  Component Value Date    HGBA1C 7.6 (A) 07/11/2022    Lab Results  Component Value Date   CREATININE 1.54 (H) 07/16/2022   BUN 27 (H) 07/16/2022   NA 138 07/16/2022   K 4.0 07/16/2022   CL 104 07/16/2022   CO2 28 07/16/2022    Lab Results  Component Value Date   CHOL 124 07/11/2022   HDL 30 (L) 07/11/2022   LDLCALC 61 07/11/2022   TRIG 195 (H) 07/11/2022   CHOLHDL 4.1 07/11/2022    Medications Reviewed Today     Reviewed by [place], Patti E, CMA (Certified Medical Assistant) on 09/04/22 at 1405  Med List Status: <None>   Medication Order Taking? Sig Documenting Provider Last Dose Status Informant  acetaminophen (TYLENOL) 500 MG tablet [id number] Yes Take 500 mg by mouth every 6 (six) hours as needed for moderate pain or fever.  [provider] Taking Active Self           Med Note Renelda Loma   Thu Jul 11, 2022 10:21 AM) Prn   aspirin EC 81 MG tablet [id number] Yes Take 81 mg by mouth at bedtime. [provider] Taking Active Self  benzonatate (TESSALON) 200 MG capsule [id number] Yes Take 1 capsule (200 mg total) by mouth 2 (two) times daily as needed for cough. Ivonne Andrew, NP Taking Active   Blood Glucose Monitoring Suppl (ACCU-CHEK GUIDE) w/Device Andria Rhein [id number] Yes USE UP TO FOUR TIMES DAILY AS DIRECTED. (FOR ICD-10 E10.9, E11.9). Ivonne Andrew, NP Taking Active   carvedilol (COREG) 12.5 MG tablet [id number] Yes TAKE 1 TABLET (12.5 MG TOTAL) BY MOUTH 2 (TWO) TIMES DAILY WITH A MEAL. Ivonne Andrew, NP Taking Active   clopidogrel (PLAVIX) 75 MG tablet [id number] Yes  TAKE 1 TABLET BY MOUTH EVERY DAY Ivonne Andrew, NP Taking Active   docusate sodium (COLACE) 100 MG capsule [id number] Yes TAKE 1 CAPSULE (100 MG TOTAL) BY MOUTH 2 (TWO) TIMES DAILY AS NEEDED FOR MILD CONSTIPATION. Ivonne Andrew, NP Taking Active            Med Note Renelda Loma   Thu Jul 11, 2022 10:21 AM) Prn   empagliflozin (JARDIANCE) 25 MG TABS tablet [id number] Yes Take 1 tablet (25 mg total) by  mouth daily before breakfast. Ivonne Andrew, NP Taking Active   ferrous sulfate 324 MG TBEC [id number] Yes Take 1 tablet (324 mg total) by mouth daily with breakfast. Kallie Locks, FNP Taking Active   gabapentin (NEURONTIN) 300 MG capsule [id number] Yes TAKE 1 CAPSULE BY MOUTH THREE TIMES A DAY Ivonne Andrew, NP Taking Active   glimepiride (AMARYL) 2 MG tablet [id number] Yes TAKE 1 TABLET BY MOUTH DAILY BEFORE BREAKFAST. Ivonne Andrew, NP Taking Active   Glucose Blood (BLOOD GLUCOSE TEST STRIPS) STRP [id number] Yes 1 each by In Vitro route 3 (three) times daily with meals. May substitute to any manufacturer covered by patient's insurance. Ivonne Andrew, NP Taking Active   metFORMIN (GLUCOPHAGE-XR) 500 MG 24 hr tablet [id number] Yes Take 1 tablet (500 mg total) by mouth 2 (two) times daily with a meal. Ivonne Andrew, NP Taking Active   Misc. Devices (ROLLATOR [place]) [place] [id number] Yes 1 each by Does not apply route daily. Ivonne Andrew, NP Taking Active   nitroGLYCERIN (NITROSTAT) 0.4 MG SL tablet [id number] Yes Place 1 tablet (0.4 mg total) under the tongue every 5 (five) minutes x 3 doses as needed for chest pain. Sharlene Dory, [place] Taking Active   rosuvastatin (CRESTOR) 40 MG tablet [id number] Yes TAKE 1 TABLET BY MOUTH EVERY DAY Ivonne Andrew, NP Taking Active   traZODone (DESYREL) 50 MG tablet [id number] Yes TAKE 0.5-1 TABLETS BY MOUTH AT BEDTIME AS NEEDED FOR SLEEP. Ivonne Andrew, NP Taking Active               Assessment/Plan:   Diabetes: - Currently uncontrolled - Reviewed long term cardiovascular and renal outcomes of uncontrolled blood sugar - Reviewed goal A1c, goal fasting, and goal 2 hour post prandial glucose - Discussed preference for GLP1 due to risk of hypoglycemia with glimepiride; additionally, positive nephroprotective benefit with GLP1. Given cost concerns and patient being over income for patient assistance, will continue current regimen at  this time. Follow up with PCP as scheduled  - Recommend to check glucose twice daily, fasting and 2 hour post prandial   Hypertension: - Currently controlled - Reviewed long term cardiovascular and renal outcomes of uncontrolled blood pressure - Reviewed appropriate blood pressure monitoring technique and reviewed goal blood pressure. Recommended to check home blood pressure and heart rate periodically. Recommended to bring these readings to upcoming PCP visit.   Hyperlipidemia/ASCVD Risk Reduction: - Currently controlled.  - Reviewed long term complications of uncontrolled cholesterol - Recommend to continue current regimen at this time   Follow Up Plan: phone call in 6 weeks  Catie Eppie Gibson, PharmD, BCACP, CPP [facility] [phone]

## 2022-10-10 ENCOUNTER — Ambulatory Visit (INDEPENDENT_AMBULATORY_CARE_PROVIDER_SITE_OTHER): Payer: Medicare Other | Admitting: Nurse Practitioner

## 2022-10-10 VITALS — BP 130/60 | HR 79 | Temp 97.2°F | Ht 75.0 in | Wt 223.6 lb

## 2022-10-10 DIAGNOSIS — E119 Type 2 diabetes mellitus without complications: Secondary | ICD-10-CM

## 2022-10-10 LAB — POCT GLYCOSYLATED HEMOGLOBIN (HGB A1C): Hemoglobin A1C: 6.5 % — AB (ref 4.0–5.6)

## 2022-10-10 LAB — LAB REPORT - SCANNED: Creatinine, POC: 83.9 mg/dL

## 2022-10-10 NOTE — Assessment & Plan Note (Signed)
-   POCT glycosylated hemoglobin (Hb A1C)  - continue current medications  Follow up:  Follow up in 3 months

## 2022-10-10 NOTE — Patient Instructions (Addendum)
1. Type 2 diabetes mellitus without complication, without long-term current use of insulin   - POCT glycosylated hemoglobin (Hb A1C)  - continue current medications  Follow up:  Follow up in 3 months

## 2022-10-10 NOTE — Progress Notes (Signed)
@Patient  ID: Kirk [name], male    DOB: 02-11-1956, 67 y.o.   MRN: [id number]  Chief Complaint  Patient presents with   Follow-up    Referring provider: Ivonne Andrew, NP   HPI  Kirk [name] 67 y.o. male  has a past medical history of Arthritis, Atrial flutter with rapid ventricular response (HCC) (04/19/2019), Dizziness (10/2019), Dyspnea, Elevated serum creatinine (07/2020), HTN (hypertension), Hyperlipidemia, Lung cancer (HCC) (05/2019), Microalbuminuria (07/2020), NSTEMI (non-ST elevated myocardial infarction) (HCC) (09/04/2015), PAD (peripheral artery disease) (HCC), Refusal of blood transfusions as patient is Jehovah's Witness, Type II diabetes mellitus (HCC), and Vitamin D deficiency (07/2020). To the [facility] for reevaluation of DM2.      Diagnosed and treated for stage 4 lung cancer. States that he completed six rounds of chemotherapy and oral therapy. Was informed by oncologist that cancer is stable, but not in remission. Completes follow up every 3 mths for reevaluation of lung cancer. lung cancer still stable. Saw nephrology yesterday. Had labs through nephrology.      Diabetes Mellitus: Patient presents for follow up of diabetes. Symptoms: none.  Patient denies foot ulcerations, hypoglycemia , nausea, and polydipsia.  Evaluation to date has been included: hemoglobin A1C.  Home sugars: BG. Treatment to date: no recent interventions. Denies monitoring meals, but walks his dog daily. Hgb A1c 6.5. Is compliant with medications. Patient does follow with nephrology.    Denies any other concerns today. Denies any fatigue, chest pain, shortness of breath, HA or dizziness. Denies any blurred vision, numbness or tingling.      Allergies  Allergen Reactions   Seasonal Ic [Octacosanol]     Immunization History  Administered Date(s) Administered   Fluad Quad(high Dose 65+) 03/21/2020, 03/15/2021, 03/08/2022   Influenza, High Dose Seasonal PF 03/22/2019   PFIZER(Purple  Top)SARS-COV-2 Vaccination 01/18/2020, 07/05/2020, 08/02/2020, 09/27/2020   Pneumococcal Conjugate-13 03/22/2019    Past Medical History:  Diagnosis Date   Arthritis    "right knee" (09/04/2015)   Atrial flutter with rapid ventricular response (HCC) 04/19/2019   Dizziness 10/2019   Dyspnea    increased exertion   Elevated serum creatinine 07/2020   HTN (hypertension)    Hyperlipidemia    Kidney disease    stage 3B   Lung cancer (HCC) 05/2019   Microalbuminuria 07/2020   NSTEMI (non-ST elevated myocardial infarction) (HCC) 09/04/2015   a. cath 09/05/2015: 95% stenosis mid-LAD, 55% mid-RCA, and 40% prox-RCA. PCI performed w/ Synergy DES to LAD   PAD (peripheral artery disease) (HCC)    Stenting of bilateral iliacs in 2003.   Refusal of blood transfusions as patient is Jehovah's Witness    Type II diabetes mellitus (HCC)    Vitamin D deficiency 07/2020    Tobacco History: Social History   Tobacco Use  Smoking Status Former   Packs/day: 2.00   Years: 35.00   Additional pack years: 0.00   Total pack years: 70.00   Types: Cigarettes   Quit date: 02/19/2003   Years since quitting: 19.6  Smokeless Tobacco Never   Counseling given: Not Answered   Outpatient Encounter Medications as of 10/10/2022  Medication Sig   acetaminophen (TYLENOL) 500 MG tablet Take 500 mg by mouth every 6 (six) hours as needed for moderate pain or fever.    aspirin EC 81 MG tablet Take 81 mg by mouth at bedtime.   benzonatate (TESSALON) 200 MG capsule Take 1 capsule (200 mg total) by mouth 2 (two) times daily as needed for cough.  Blood Glucose Monitoring Suppl (ACCU-CHEK GUIDE) w/Device KIT USE UP TO FOUR TIMES DAILY AS DIRECTED. (FOR ICD-10 E10.9, E11.9).   carvedilol (COREG) 12.5 MG tablet TAKE 1 TABLET (12.5 MG TOTAL) BY MOUTH 2 (TWO) TIMES DAILY WITH A MEAL.   clopidogrel (PLAVIX) 75 MG tablet TAKE 1 TABLET BY MOUTH EVERY DAY   docusate sodium (COLACE) 100 MG capsule TAKE 1 CAPSULE (100 MG TOTAL)  BY MOUTH 2 (TWO) TIMES DAILY AS NEEDED FOR MILD CONSTIPATION.   empagliflozin (JARDIANCE) 25 MG TABS tablet Take 1 tablet (25 mg total) by mouth daily before breakfast.   gabapentin (NEURONTIN) 300 MG capsule TAKE 1 CAPSULE BY MOUTH THREE TIMES A DAY   glimepiride (AMARYL) 2 MG tablet TAKE 1 TABLET BY MOUTH DAILY BEFORE BREAKFAST.   Glucose Blood (BLOOD GLUCOSE TEST STRIPS) STRP 1 each by In Vitro route 3 (three) times daily with meals. May substitute to any manufacturer covered by patient's insurance.   metFORMIN (GLUCOPHAGE-XR) 500 MG 24 hr tablet Take 1 tablet (500 mg total) by mouth 2 (two) times daily with a meal.   Misc. Devices (ROLLATOR ULTRA-LIGHT) MISC 1 each by Does not apply route daily.   nitroGLYCERIN (NITROSTAT) 0.4 MG SL tablet Place 1 tablet (0.4 mg total) under the tongue every 5 (five) minutes x 3 doses as needed for chest pain.   rosuvastatin (CRESTOR) 40 MG tablet TAKE 1 TABLET BY MOUTH EVERY DAY   traZODone (DESYREL) 50 MG tablet TAKE 0.5-1 TABLETS BY MOUTH AT BEDTIME AS NEEDED FOR SLEEP.   ferrous sulfate 324 MG TBEC Take 1 tablet (324 mg total) by mouth daily with breakfast. (Patient not taking: Reported on 10/10/2022)   No facility-administered encounter medications on file as of 10/10/2022.     Review of Systems  Review of Systems  Constitutional: Negative.   HENT: Negative.    Cardiovascular: Negative.   Gastrointestinal: Negative.   Allergic/Immunologic: Negative.   Neurological: Negative.   Psychiatric/Behavioral: Negative.         Physical Exam  BP 130/60   Pulse 79   Temp (!) 97.2 F (36.2 C)   Ht 6\' 3"  (1.905 m)   Wt 223 lb 9.6 oz (101.4 kg)   SpO2 98%   BMI 27.95 kg/m   Wt Readings from Last 5 Encounters:  10/10/22 223 lb 9.6 oz (101.4 kg)  09/04/22 225 lb (102.1 kg)  07/18/22 230 lb 7 oz (104.5 kg)  07/15/22 229 lb 6.4 oz (104.1 kg)  07/11/22 232 lb 3.2 oz (105.3 kg)     Physical Exam Vitals and nursing note reviewed.   Constitutional:      General: He is not in acute distress.    Appearance: He is well-developed.  Cardiovascular:     Rate and Rhythm: Normal rate and regular rhythm.  Pulmonary:     Effort: Pulmonary effort is normal.     Breath sounds: Normal breath sounds.  Skin:    General: Skin is warm and dry.  Neurological:     Mental Status: He is alert and oriented to person, place, and time.      Lab Results:  CBC    Component Value Date/Time   WBC 6.0 07/16/2022 1328   WBC 9.2 12/25/2019 1850   RBC 4.65 07/16/2022 1328   HGB 13.8 07/16/2022 1328   HCT 41.3 07/16/2022 1328   PLT 266 07/16/2022 1328   MCV 88.8 07/16/2022 1328   MCH 29.7 07/16/2022 1328   MCHC 33.4 07/16/2022 1328   RDW  13.9 07/16/2022 1328   LYMPHSABS 2.8 07/16/2022 1328   MONOABS 0.6 07/16/2022 1328   EOSABS 0.2 07/16/2022 1328   BASOSABS 0.1 07/16/2022 1328    BMET    Component Value Date/Time   NA 138 07/16/2022 1328   NA 140 06/08/2015 0840   K 4.0 07/16/2022 1328   CL 104 07/16/2022 1328   CO2 28 07/16/2022 1328   GLUCOSE 127 (H) 07/16/2022 1328   BUN 27 (H) 07/16/2022 1328   BUN 15 06/08/2015 0840   CREATININE 1.54 (H) 07/16/2022 1328   CREATININE 1.06 10/01/2016 0855   CALCIUM 9.7 07/16/2022 1328   GFRNONAA 49 (L) 07/16/2022 1328   GFRNONAA 75 10/01/2016 0855   GFRAA 58 (L) 02/10/2020 1120   GFRAA 87 10/01/2016 0855     Assessment & Plan:   Diabetes - POCT glycosylated hemoglobin (Hb A1C)  - continue current medications  Follow up:  Follow up in 3 months     Ivonne Andrew, NP 10/10/2022

## 2022-11-12 ENCOUNTER — Inpatient Hospital Stay: Payer: Medicare Other | Attending: Internal Medicine

## 2022-11-12 ENCOUNTER — Other Ambulatory Visit: Payer: Self-pay

## 2022-11-12 ENCOUNTER — Ambulatory Visit (HOSPITAL_COMMUNITY)
Admission: RE | Admit: 2022-11-12 | Discharge: 2022-11-12 | Disposition: A | Discharge status: Home / Self Care | Payer: Medicare Other | Source: Ambulatory Visit | Attending: Internal Medicine | Admitting: Internal Medicine

## 2022-11-12 ENCOUNTER — Encounter (HOSPITAL_COMMUNITY): Payer: Self-pay

## 2022-11-12 DIAGNOSIS — Z9221 Personal history of antineoplastic chemotherapy: Secondary | ICD-10-CM | POA: Insufficient documentation

## 2022-11-12 DIAGNOSIS — Z95828 Presence of other vascular implants and grafts: Secondary | ICD-10-CM

## 2022-11-12 DIAGNOSIS — C3490 Malignant neoplasm of unspecified part of unspecified bronchus or lung: Secondary | ICD-10-CM | POA: Insufficient documentation

## 2022-11-12 DIAGNOSIS — N289 Disorder of kidney and ureter, unspecified: Secondary | ICD-10-CM | POA: Insufficient documentation

## 2022-11-12 DIAGNOSIS — C787 Secondary malignant neoplasm of liver and intrahepatic bile duct: Secondary | ICD-10-CM | POA: Insufficient documentation

## 2022-11-12 DIAGNOSIS — D649 Anemia, unspecified: Secondary | ICD-10-CM | POA: Insufficient documentation

## 2022-11-12 LAB — CBC WITH DIFFERENTIAL (CANCER CENTER ONLY)
Abs Immature Granulocytes: 0.01 10*3/uL (ref 0.00–0.07)
Basophils Absolute: 0 10*3/uL (ref 0.0–0.1)
Basophils Relative: 1 %
Eosinophils Absolute: 0.1 10*3/uL (ref 0.0–0.5)
Eosinophils Relative: 2 %
HCT: 39.4 % (ref 39.0–52.0)
Hemoglobin: 13.1 g/dL (ref 13.0–17.0)
Immature Granulocytes: 0 %
Lymphocytes Relative: 32 %
Lymphs Abs: 2 10*3/uL (ref 0.7–4.0)
MCH: 30.3 pg (ref 26.0–34.0)
MCHC: 33.2 g/dL (ref 30.0–36.0)
MCV: 91 fL (ref 80.0–100.0)
Monocytes Absolute: 0.7 10*3/uL (ref 0.1–1.0)
Monocytes Relative: 12 %
Neutro Abs: 3.3 10*3/uL (ref 1.7–7.7)
Neutrophils Relative %: 53 %
Platelet Count: 235 10*3/uL (ref 150–400)
RBC: 4.33 MIL/uL (ref 4.22–5.81)
RDW: 13.2 % (ref 11.5–15.5)
WBC Count: 6.1 10*3/uL (ref 4.0–10.5)
nRBC: 0 % (ref 0.0–0.2)

## 2022-11-12 LAB — CMP (CANCER CENTER ONLY)
ALT: 16 U/L (ref 0–44)
AST: 18 U/L (ref 15–41)
Albumin: 4.1 g/dL (ref 3.5–5.0)
Alkaline Phosphatase: 70 U/L (ref 38–126)
Anion gap: 6 (ref 5–15)
BUN: 21 mg/dL (ref 8–23)
CO2: 27 mmol/L (ref 22–32)
Calcium: 9.7 mg/dL (ref 8.9–10.3)
Chloride: 109 mmol/L (ref 98–111)
Creatinine: 1.4 mg/dL — ABNORMAL HIGH (ref 0.61–1.24)
GFR, Estimated: 55 mL/min — ABNORMAL LOW (ref >60)
Glucose, Bld: 144 mg/dL — ABNORMAL HIGH (ref 70–99)
Potassium: 4.2 mmol/L (ref 3.5–5.1)
Sodium: 142 mmol/L (ref 135–145)
Total Bilirubin: 0.4 mg/dL (ref 0.3–1.2)
Total Protein: 7.3 g/dL (ref 6.5–8.1)

## 2022-11-12 MED ORDER — SODIUM CHLORIDE 0.9% FLUSH
10.0000 mL | Freq: Once | INTRAVENOUS | Status: AC
  Administered 2022-11-12: 10 mL

## 2022-11-12 MED ORDER — HEPARIN SOD (PORK) LOCK FLUSH 100 UNIT/ML IV SOLN
500.0000 [IU] | Freq: Once | INTRAVENOUS | Status: AC
  Administered 2022-11-12: 500 [IU]

## 2022-11-12 MED ORDER — HEPARIN SOD (PORK) LOCK FLUSH 100 UNIT/ML IV SOLN: 500.0000 [IU] | Freq: Once | INTRAVENOUS | Status: DC

## 2022-11-14 ENCOUNTER — Other Ambulatory Visit: Payer: Self-pay

## 2022-11-14 ENCOUNTER — Inpatient Hospital Stay (HOSPITAL_BASED_OUTPATIENT_CLINIC_OR_DEPARTMENT_OTHER): Payer: Medicare Other | Admitting: Internal Medicine

## 2022-11-14 VITALS — BP 145/60 | HR 74 | Temp 97.7°F | Resp 16 | Ht 75.0 in | Wt 220.3 lb

## 2022-11-14 DIAGNOSIS — N289 Disorder of kidney and ureter, unspecified: Secondary | ICD-10-CM | POA: Diagnosis not present

## 2022-11-14 DIAGNOSIS — C3490 Malignant neoplasm of unspecified part of unspecified bronchus or lung: Secondary | ICD-10-CM | POA: Diagnosis present

## 2022-11-14 DIAGNOSIS — Z9221 Personal history of antineoplastic chemotherapy: Secondary | ICD-10-CM | POA: Diagnosis not present

## 2022-11-14 DIAGNOSIS — C787 Secondary malignant neoplasm of liver and intrahepatic bile duct: Secondary | ICD-10-CM | POA: Diagnosis not present

## 2022-11-14 DIAGNOSIS — D649 Anemia, unspecified: Secondary | ICD-10-CM | POA: Diagnosis not present

## 2022-11-14 NOTE — Progress Notes (Signed)
[name] [phone]   [phone]  OFFICE PROGRESS NOTE  Ivonne Andrew, NP [address] [place] [address]  DIAGNOSIS: Stage IV (TX, N3, M1c) non-small cell lung cancer, poorly differentiated adenocarcinoma presented with bulky mediastinal lymphadenopathy in addition to right axillary lymphadenopathy and metastatic liver lesions, abdominal lymphadenopathy diagnosed in January 2021. Molecular studies by [employer] that shows no actionable mutations.  PRIOR THERAPY: Systemic chemotherapy with carboplatin for AUC of 5, Alimta 500 mg/M2 and Keytruda 200 mg IV every 3 weeks.  First dose July 14, 2019.  Status post 35  cycles. Starting from cycle #5 the patient will be treated with maintenance Alimta and Keytruda every 3 weeks.  Starting from cycle #12 he will be on single agent treatment with Keytruda secondary to renal insufficiency.  CURRENT THERAPY: Observation  INTERVAL HISTORY: Kirk [name] 67 y.o. male returns to the clinic today for 4 months follow-up visit.  The patient is feeling fine today with no concerning complaints.  He denies having any chest pain, shortness of breath, cough or hemoptysis.  He has no nausea, vomiting, diarrhea or constipation.  He has no headache or visual changes.  He denied having any significant weight loss or night sweats.  He had repeat CT scan of the chest, abdomen and pelvis performed recently and he is here for evaluation and discussion of his scan results.  MEDICAL HISTORY: Past Medical History:  Diagnosis Date   Arthritis    "right knee" (09/04/2015)   Atrial flutter with rapid ventricular response (HCC) 04/19/2019   Dizziness 10/2019   Dyspnea    increased exertion   Elevated serum creatinine 07/2020   HTN (hypertension)    Hyperlipidemia    Kidney disease    stage 3B   Lung cancer (HCC) 05/2019   Microalbuminuria 07/2020   NSTEMI (non-ST elevated myocardial infarction) (HCC)  09/04/2015   a. cath 09/05/2015: 95% stenosis mid-LAD, 55% mid-RCA, and 40% prox-RCA. PCI performed w/ Synergy DES to LAD   PAD (peripheral artery disease) (HCC)    Stenting of bilateral iliacs in 2003.   Refusal of blood transfusions as patient is Jehovah's Witness    Type II diabetes mellitus (HCC)    Vitamin D deficiency 07/2020    ALLERGIES:  is allergic to seasonal ic [octacosanol].  MEDICATIONS:  Current Outpatient Medications  Medication Sig Dispense Refill   acetaminophen (TYLENOL) 500 MG tablet Take 500 mg by mouth every 6 (six) hours as needed for moderate pain or fever.      aspirin EC 81 MG tablet Take 81 mg by mouth at bedtime.     benzonatate (TESSALON) 200 MG capsule Take 1 capsule (200 mg total) by mouth 2 (two) times daily as needed for cough. 20 capsule 0   Blood Glucose Monitoring Suppl (ACCU-CHEK GUIDE) w/Device KIT USE UP TO FOUR TIMES DAILY AS DIRECTED. (FOR ICD-10 E10.9, E11.9). 1 kit 0   carvedilol (COREG) 12.5 MG tablet TAKE 1 TABLET (12.5 MG TOTAL) BY MOUTH 2 (TWO) TIMES DAILY WITH A MEAL. 180 tablet 3   clopidogrel (PLAVIX) 75 MG tablet TAKE 1 TABLET BY MOUTH EVERY DAY 90 tablet 3   docusate sodium (COLACE) 100 MG capsule TAKE 1 CAPSULE (100 MG TOTAL) BY MOUTH 2 (TWO) TIMES DAILY AS NEEDED FOR MILD CONSTIPATION. 180 capsule 3   empagliflozin (JARDIANCE) 25 MG TABS tablet Take 1 tablet (25 mg total) by mouth daily before breakfast. 90 tablet 1  ferrous sulfate 324 MG TBEC Take 1 tablet (324 mg total) by mouth daily with breakfast. 30 tablet 11   gabapentin (NEURONTIN) 300 MG capsule TAKE 1 CAPSULE BY MOUTH THREE TIMES A DAY 270 capsule 3   glimepiride (AMARYL) 2 MG tablet TAKE 1 TABLET BY MOUTH DAILY BEFORE BREAKFAST. 90 tablet 0   Glucose Blood (BLOOD GLUCOSE TEST STRIPS) STRP 1 each by In Vitro route 3 (three) times daily with meals. May substitute to any manufacturer covered by patient's insurance. 100 strip 3   metFORMIN (GLUCOPHAGE-XR) 500 MG 24 hr tablet  Take 1 tablet (500 mg total) by mouth 2 (two) times daily with a meal. 180 tablet 3   Misc. Devices (ROLLATOR ULTRA-LIGHT) MISC 1 each by Does not apply route daily. 1 each 0   nitroGLYCERIN (NITROSTAT) 0.4 MG SL tablet Place 1 tablet (0.4 mg total) under the tongue every 5 (five) minutes x 3 doses as needed for chest pain. 25 tablet 2   rosuvastatin (CRESTOR) 40 MG tablet TAKE 1 TABLET BY MOUTH EVERY DAY 90 tablet 3   traZODone (DESYREL) 50 MG tablet TAKE 0.5-1 TABLETS BY MOUTH AT BEDTIME AS NEEDED FOR SLEEP. 90 tablet 2   No current facility-administered medications for this visit.    SURGICAL HISTORY:  Past Surgical History:  Procedure Laterality Date   CARDIAC CATHETERIZATION N/A 09/05/2015   Procedure: Left Heart Cath and Coronary Angiography;  Surgeon: Lennette Bihari, MD;  Location: [facility] INVASIVE CV LAB;  Service: Cardiovascular;  Laterality: N/A;   CARDIAC CATHETERIZATION N/A 09/05/2015   Procedure: Coronary Stent Intervention;  Surgeon: Lennette Bihari, MD;  Location: [facility] INVASIVE CV LAB;  Service: Cardiovascular;  Laterality: N/A;   IR IMAGING GUIDED PORT INSERTION  07/15/2019   ORIF CONGENITAL HIP DISLOCATION Left ~ 1965   "put pins in it"   PERIPHERAL VASCULAR CATHETERIZATION Bilateral 2003   "stenting"   TONSILLECTOMY  ~ 1963    REVIEW OF SYSTEMS:  A comprehensive review of systems was negative.   PHYSICAL EXAMINATION: General appearance: alert, cooperative, appears stated age, and no distress Head: Normocephalic, without obvious abnormality, atraumatic Neck: no adenopathy, no JVD, supple, symmetrical, trachea midline, and thyroid not enlarged, symmetric, no tenderness/mass/nodules Lymph nodes: Cervical, supraclavicular, and axillary nodes normal. Resp: clear to auscultation bilaterally Back: symmetric, no curvature. ROM normal. No CVA tenderness. Cardio: regular rate and rhythm, S1, S2 normal, no murmur, click, rub or gallop GI: soft, non-tender; bowel sounds normal; no masses,   no organomegaly Extremities: extremities normal, atraumatic, no cyanosis or edema  ECOG PERFORMANCE STATUS: 1 - Symptomatic but completely ambulatory  Blood pressure (!) 145/60, pulse 74, temperature 97.7 F (36.5 C), temperature source Temporal, resp. rate 16, height 6\' 3"  (1.905 m), weight 220 lb 4.8 oz (99.9 kg), SpO2 99 %.  LABORATORY DATA: Lab Results  Component Value Date   WBC 6.1 11/12/2022   HGB 13.1 11/12/2022   HCT 39.4 11/12/2022   MCV 91.0 11/12/2022   PLT 235 11/12/2022      Chemistry      Component Value Date/Time   NA 142 11/12/2022 0947   NA 140 06/08/2015 0840   K 4.2 11/12/2022 0947   CL 109 11/12/2022 0947   CO2 27 11/12/2022 0947   BUN 21 11/12/2022 0947   BUN 15 06/08/2015 0840   CREATININE 1.40 (H) 11/12/2022 0947   CREATININE 1.06 10/01/2016 0855      Component Value Date/Time   CALCIUM 9.7 11/12/2022 0947   ALKPHOS 70  11/12/2022 0947   AST 18 11/12/2022 0947   ALT 16 11/12/2022 0947   BILITOT 0.4 11/12/2022 0947       RADIOGRAPHIC STUDIES: CT Chest Wo Contrast  Result Date: 11/14/2022 CLINICAL DATA:  Non-small-cell lung cancer. Restaging. Chemotherapy and immunotherapy complete. Asymptomatic. * Tracking Code: BO * EXAM: CT CHEST, ABDOMEN AND PELVIS WITHOUT CONTRAST TECHNIQUE: Multidetector CT imaging of the chest, abdomen and pelvis was performed following the standard protocol without IV contrast. RADIATION DOSE REDUCTION: This exam was performed according to the departmental dose-optimization program which includes automated exposure control, adjustment of the mA and/or kV according to patient size and/or use of iterative reconstruction technique. COMPARISON:  07/16/2022 FINDINGS: CT CHEST FINDINGS Cardiovascular: Right Port-A-Cath tip high right atrium. Aortic atherosclerosis. No hydronephrosis. Normal heart size, without pericardial effusion. Left main and 3 vessel coronary artery calcification. Mediastinum/Nodes: No supraclavicular  adenopathy. No mediastinal or hilar adenopathy, given limitations of unenhanced CT. Lungs/Pleura: No pleural fluid.  Mild centrilobular emphysema. Similar right middle lobe scarring. Musculoskeletal: No acute osseous abnormality. CT ABDOMEN PELVIS FINDINGS Hepatobiliary: Hypoattenuating pericholecystic right liver lobe lesion measures 2.7 x 2.0 cm and is similar to on the prior exam. Consistent with a hemangioma on 01/17/2020. Normal gallbladder, without biliary ductal dilatation. Pancreas: Normal, without mass or ductal dilatation. Spleen: Normal in size, without focal abnormality. Adrenals/Urinary Tract: Normal adrenal glands. No renal calculi or hydronephrosis. No hydroureter or ureteric calculi. No bladder calculi. Stomach/Bowel: Normal stomach, without wall thickening. Normal colon, appendix, and terminal ileum. Normal small bowel. Vascular/Lymphatic: Aortic atherosclerosis. Bilateral iliac stents. Borderline prominent gastrohepatic ligament nodes are likely reactive and unchanged back to 2021. No pelvic sidewall adenopathy. Reproductive: Normal prostate. Other: No significant free fluid. No evidence of omental or peritoneal disease. Musculoskeletal: Left proximal femur fixation. Right greater than left hip osteoarthritis. IMPRESSION: 1. No acute process or evidence of metastatic disease within the chest, abdomen, or pelvis. 2. Aortic atherosclerosis (ICD10-I70.0), coronary artery atherosclerosis and emphysema (ICD10-J43.9). 3. Similar right hepatic lobe lesion, consistent with a hemangioma on 01/17/2020 contrast-enhanced exam. Electronically Signed   By: Jeronimo Greaves M.D.   On: 11/14/2022 08:27   CT Abdomen Pelvis Wo Contrast  Result Date: 11/14/2022 CLINICAL DATA:  Non-small-cell lung cancer. Restaging. Chemotherapy and immunotherapy complete. Asymptomatic. * Tracking Code: BO * EXAM: CT CHEST, ABDOMEN AND PELVIS WITHOUT CONTRAST TECHNIQUE: Multidetector CT imaging of the chest, abdomen and pelvis was  performed following the standard protocol without IV contrast. RADIATION DOSE REDUCTION: This exam was performed according to the departmental dose-optimization program which includes automated exposure control, adjustment of the mA and/or kV according to patient size and/or use of iterative reconstruction technique. COMPARISON:  07/16/2022 FINDINGS: CT CHEST FINDINGS Cardiovascular: Right Port-A-Cath tip high right atrium. Aortic atherosclerosis. No hydronephrosis. Normal heart size, without pericardial effusion. Left main and 3 vessel coronary artery calcification. Mediastinum/Nodes: No supraclavicular adenopathy. No mediastinal or hilar adenopathy, given limitations of unenhanced CT. Lungs/Pleura: No pleural fluid.  Mild centrilobular emphysema. Similar right middle lobe scarring. Musculoskeletal: No acute osseous abnormality. CT ABDOMEN PELVIS FINDINGS Hepatobiliary: Hypoattenuating pericholecystic right liver lobe lesion measures 2.7 x 2.0 cm and is similar to on the prior exam. Consistent with a hemangioma on 01/17/2020. Normal gallbladder, without biliary ductal dilatation. Pancreas: Normal, without mass or ductal dilatation. Spleen: Normal in size, without focal abnormality. Adrenals/Urinary Tract: Normal adrenal glands. No renal calculi or hydronephrosis. No hydroureter or ureteric calculi. No bladder calculi. Stomach/Bowel: Normal stomach, without wall thickening. Normal colon, appendix, and terminal ileum. Normal  small bowel. Vascular/Lymphatic: Aortic atherosclerosis. Bilateral iliac stents. Borderline prominent gastrohepatic ligament nodes are likely reactive and unchanged back to 2021. No pelvic sidewall adenopathy. Reproductive: Normal prostate. Other: No significant free fluid. No evidence of omental or peritoneal disease. Musculoskeletal: Left proximal femur fixation. Right greater than left hip osteoarthritis. IMPRESSION: 1. No acute process or evidence of metastatic disease within the chest,  abdomen, or pelvis. 2. Aortic atherosclerosis (ICD10-I70.0), coronary artery atherosclerosis and emphysema (ICD10-J43.9). 3. Similar right hepatic lobe lesion, consistent with a hemangioma on 01/17/2020 contrast-enhanced exam. Electronically Signed   By: Jeronimo Greaves M.D.   On: 11/14/2022 08:27    ASSESSMENT AND PLAN: This is a very pleasant 67 years old African-American male recently diagnosed with a stage IV (TX, N3, M1c) non-small cell lung cancer, poorly differentiated adenocarcinoma presented with bulky mediastinal as well as right hilar, supraclavicular, abdominal and right axillary lymphadenopathy in addition to metastatic liver lesions diagnosed in January 2021. Molecular studies by guardant 360 shows no actionable mutations.  The patient is not a candidate for treatment with targeted therapy or enrollment in the clinical trial with the [facility] regimen. The patient is currently undergoing systemic chemotherapy with carboplatin for AUC of 5, Alimta 500 mg/M2 and Keytruda 200 mg IV every 3 weeks status post 35 cycles. Starting from cycle #5 he is treated with maintenance Alimta and Keytruda every 3 weeks.  Starting from cycle #12 the patient has been on treatment with single agent Keytruda because of the renal insufficiency. The patient completed 2 years of treatment with immunotherapy. The patient is currently on observation and he is feeling fine with no concerning complaints. He had repeat CT scan of the chest, abdomen and pelvis performed recently.  I personally and independently reviewed the scan and discussed the result with the patient today. His scan showed no concerning findings for disease progression. I recommended for him to continue on observation with repeat CT scan of the chest, abdomen and pelvis in 4 months. For the anemia, he will continue with the oral iron supplement for now. For the renal insufficiency he is followed by nephrology. The patient will continue to have Port-A-Cath  flush every 2 months. He was advised to call immediately if he has any concerning symptoms in the interval. The patient voices understanding of current disease status and treatment options and is in agreement with the current care plan.  All questions were answered. The patient knows to call the clinic with any problems, questions or concerns. We can certainly see the patient much sooner if necessary.  Disclaimer: This note was dictated with voice recognition software. Similar sounding words can inadvertently be transcribed and may not be corrected upon review.

## 2022-11-18 ENCOUNTER — Telehealth: Payer: Self-pay | Admitting: Internal Medicine

## 2022-11-18 NOTE — Telephone Encounter (Signed)
Scheduled per 06/20 los, patient had been called and notified.

## 2022-11-25 ENCOUNTER — Other Ambulatory Visit: Payer: Self-pay | Admitting: Nurse Practitioner

## 2022-11-25 DIAGNOSIS — G620 Drug-induced polyneuropathy: Secondary | ICD-10-CM

## 2022-11-26 NOTE — Telephone Encounter (Signed)
Please advise KH 

## 2022-11-27 ENCOUNTER — Other Ambulatory Visit: Payer: Medicare Other | Admitting: Pharmacist

## 2022-11-27 NOTE — Progress Notes (Signed)
11/27/2022 Name: Kirk [name] MRN: [id number] DOB: Jun 27, 1955  Chief Complaint  Patient presents with   Medication Management   Diabetes   Hypertension   Hyperlipidemia    Kirk [name] is a 67 y.o. year old male who presented for a telephone visit.   They were referred to the pharmacist by their PCP for assistance in managing diabetes, hypertension, and hyperlipidemia.    Subjective:  Care Team: Primary Care Provider: Ivonne Andrew, NP ; Next Scheduled Visit: 12/2021  Medication Access/Adherence  Current Pharmacy:  [id number] [place], [place] - [address] [place] [address] Phone: [phone] Fax: [phone]  [facility] - [place], [place] - [address] [place] [address] Phone: [phone] Fax: [phone]   Patient reports affordability concerns with their medications: No  Patient reports access/transportation concerns to their pharmacy: No  Patient reports adherence concerns with their medications:  No     Diabetes:  Current medications: metformin XR 500 mg twice daily, Jardiance 25 mg daily, glimepiride 2 mg QAM  Current glucose readings: periodically checking, notes fastings are 140s and 2 hour post prandial lower.   Patient denies hypoglycemic s/sx including dizziness, shakiness, sweating. Patient  hyperglycemic symptoms including =polyuria, polydipsia, polyphagia, nocturia, neuropathy, blurred vision.  Over income for medication assistance. Prior trial of metformin + Jardiance did not control blood sugars. Denies hypoglycemia with glimepiride.   Hypertension:  Current medications: carvedilol 12.5 mg twice daily Medications previously tried: lisinopril - started by nephrology, AKI  Patient has a validated, automated, upper arm home BP cuff Current blood pressure readings readings: has not been checking  Patient denies hypotensive s/sx including  dizziness, lightheadedness.  Patient denies hypertensive symptoms including headache, chest pain, shortness of breath  Hyperlipidemia/ASCVD Risk Reduction  Current lipid lowering medications: rosuvastatin 40 mg daily  Antiplatelet regimen: aspirin 81 mg daily, clopidogrel 75 mg daily  Objective:  Lab Results  Component Value Date   HGBA1C 6.5 (A) 10/10/2022    Lab Results  Component Value Date   CREATININE 1.40 (H) 11/12/2022   BUN 21 11/12/2022   NA 142 11/12/2022   K 4.2 11/12/2022   CL 109 11/12/2022   CO2 27 11/12/2022    Lab Results  Component Value Date   CHOL 124 07/11/2022   HDL 30 (L) 07/11/2022   LDLCALC 61 07/11/2022   TRIG 195 (H) 07/11/2022   CHOLHDL 4.1 07/11/2022    Medications Reviewed Today     Reviewed by Kirk Filbert, Kirk [name] (Licensed Practical Nurse) on 11/14/22 at 1113  Med List Status: <None>   Medication Order Taking? Sig Documenting Provider Last Dose Status Informant  acetaminophen (TYLENOL) 500 MG tablet [id number] Yes Take 500 mg by mouth every 6 (six) hours as needed for moderate pain or fever.  [provider] Taking Active Self           Med Note Kirk [name]   Thu Jul 11, 2022 10:21 AM) Prn   aspirin EC 81 MG tablet [id number] Yes Take 81 mg by mouth at bedtime. [provider] Taking Active Self  benzonatate (TESSALON) 200 MG capsule [id number] Yes Take 1 capsule (200 mg total) by mouth 2 (two) times daily as needed for cough. Kirk Andrew, NP Taking Active   Blood Glucose Monitoring Suppl (ACCU-CHEK GUIDE) w/Device Kirk [name] [id number] Yes USE UP TO FOUR TIMES DAILY AS DIRECTED. (FOR ICD-10 E10.9, E11.9). Kirk Andrew, NP Taking Active  carvedilol (COREG) 12.5 MG tablet [id number] Yes TAKE 1 TABLET (12.5 MG TOTAL) BY MOUTH 2 (TWO) TIMES DAILY WITH A MEAL. Kirk Andrew, NP Taking Active   clopidogrel (PLAVIX) 75 MG tablet [id number] Yes TAKE 1 TABLET BY MOUTH EVERY DAY Kirk Andrew, NP Taking Active    docusate sodium (COLACE) 100 MG capsule [id number] Yes TAKE 1 CAPSULE (100 MG TOTAL) BY MOUTH 2 (TWO) TIMES DAILY AS NEEDED FOR MILD CONSTIPATION. Kirk Andrew, NP Taking Active            Med Note Kirk [name]   Thu Jul 11, 2022 10:21 AM) Prn   empagliflozin (JARDIANCE) 25 MG TABS tablet [id number] Yes Take 1 tablet (25 mg total) by mouth daily before breakfast. Kirk Andrew, NP Taking Active   ferrous sulfate 324 MG TBEC [id number] Yes Take 1 tablet (324 mg total) by mouth daily with breakfast. Kirk Locks, FNP Taking Active   gabapentin (NEURONTIN) 300 MG capsule [id number] Yes TAKE 1 CAPSULE BY MOUTH THREE TIMES A DAY Kirk Andrew, NP Taking Active   glimepiride (AMARYL) 2 MG tablet [id number] Yes TAKE 1 TABLET BY MOUTH DAILY BEFORE BREAKFAST. Kirk Andrew, NP Taking Active   Glucose Blood (BLOOD GLUCOSE TEST STRIPS) STRP [id number] Yes 1 each by In Vitro route 3 (three) times daily with meals. May substitute to any manufacturer covered by patient's insurance. Kirk Andrew, NP Taking Active   metFORMIN (GLUCOPHAGE-XR) 500 MG 24 hr tablet [id number] Yes Take 1 tablet (500 mg total) by mouth 2 (two) times daily with a meal. Kirk Andrew, NP Taking Active   Misc. Devices (ROLLATOR [place]) [place] [id number] Yes 1 each by Does not apply route daily. Kirk Andrew, NP Taking Active   nitroGLYCERIN (NITROSTAT) 0.4 MG SL tablet [id number] Yes Place 1 tablet (0.4 mg total) under the tongue every 5 (five) minutes x 3 doses as needed for chest pain. Sharlene Dory, [place] Taking Active   rosuvastatin (CRESTOR) 40 MG tablet [id number] Yes TAKE 1 TABLET BY MOUTH EVERY DAY Kirk Andrew, NP Taking Active   traZODone (DESYREL) 50 MG tablet [id number] Yes TAKE 0.5-1 TABLETS BY MOUTH AT BEDTIME AS NEEDED FOR SLEEP. Kirk Andrew, NP Taking Active               Assessment/Plan:   Diabetes: - Currently controlled - Reviewed long term cardiovascular and renal outcomes of  uncontrolled blood sugar - Reviewed goal A1c, goal fasting, and goal 2 hour post prandial glucose - Recommend to continue current regimen. Cost barrier to adding GLP1 at this time, though would want to add Ozempic in the future for renal benefit, if possible.  - Recommend to check glucose periodically, fasting and 2 hour post prandial  Hypertension and CKD: - Currently uncontrolled per last office reading with oncology - Reviewed appropriate blood pressure monitoring technique and reviewed goal blood pressure. Recommended to check home blood pressure and heart rate periodically - Recommend to continue current regimen. Given elevated urinary:creatinine ratio (Media tab from nephrology), would consider trial of ARB with monitoring of BMP, but will defer to nephrology. Continue SGLT2 for renal benefit.   Hyperlipidemia/ASCVD Risk Reduction: - Currently controlled.  - Recommend to continue current regimen at this time   Follow Up Plan: PCP in 6 weeks  Catie Eppie Gibson, PharmD, BCACP, CPP Clinical Pharmacist [facility] [phone]

## 2022-11-27 NOTE — Patient Instructions (Addendum)
Mr. Quiggins,   It was great talking to you!   Continue your current regimen. Moving forward, I would want you and Tonya to consider adding Ozempic for your blood sugars. This medication can replace glimepiride and help with A1c lowering, weight management, and has shown benefit impact on kidney function. However, it would currently likely cost the same as the Jardiance, and would push you towards the Medicare Coverage Gap sooner. If things ever change that your household income is less than $78,880 for a 2 person household size, we could apply to get the Ozempic for free from the manufacture. Just let me know if so.  Check your blood sugars twice daily:  1) Fasting, first thing in the morning before breakfast and  2) 2 hours after your largest meal.   For a goal A1c of less than 7%, goal fasting readings are less than 130 and goal 2 hour after meal readings are less than 180.    Check your blood pressure twice weekly, and any time you have concerning symptoms like headache, chest pain, dizziness, shortness of breath, or vision changes.   Our goal is less than 130/80.  To appropriately check your blood pressure, make sure you do the following:  1) Avoid caffeine, exercise, or tobacco products for 30 minutes before checking. Empty your bladder. 2) Sit with your back supported in a flat-backed chair. Rest your arm on something flat (arm of the chair, table, etc). 3) Sit still with your feet flat on the floor, resting, for at least 5 minutes.  4) Check your blood pressure. Take 1-2 readings.  5) Write down these readings and bring with you to any provider appointments.  Bring your home blood pressure machine with you to a provider's office for accuracy comparison at least once a year.   Make sure you take your blood pressure medications before you come to any office visit, even if you were asked to fast for labs.   Take care!  Catie Eppie Gibson, PharmD, BCACP, CPP Clinical Pharmacist [facility] [phone]

## 2022-12-05 ENCOUNTER — Other Ambulatory Visit: Payer: Self-pay | Admitting: Nurse Practitioner

## 2023-01-03 ENCOUNTER — Other Ambulatory Visit: Payer: Self-pay | Admitting: Nurse Practitioner

## 2023-01-10 ENCOUNTER — Encounter: Payer: Self-pay | Admitting: Nurse Practitioner

## 2023-01-10 ENCOUNTER — Ambulatory Visit (INDEPENDENT_AMBULATORY_CARE_PROVIDER_SITE_OTHER): Payer: Medicare Other | Admitting: Nurse Practitioner

## 2023-01-10 VITALS — BP 118/58 | HR 74 | Temp 98.3°F | Ht 75.0 in | Wt 221.8 lb

## 2023-01-10 DIAGNOSIS — E1151 Type 2 diabetes mellitus with diabetic peripheral angiopathy without gangrene: Secondary | ICD-10-CM | POA: Diagnosis not present

## 2023-01-10 LAB — POCT GLYCOSYLATED HEMOGLOBIN (HGB A1C): HbA1c, POC (controlled diabetic range): 7 % (ref 0.0–7.0)

## 2023-01-10 NOTE — Patient Instructions (Addendum)
1. 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

## 2023-01-10 NOTE — Progress Notes (Unsigned)
@Patient  ID: Kirk [name], male    DOB: 1956/02/13, 67 y.o.   MRN: [id number]  Chief Complaint  Patient presents with   Follow-up         Referring provider: Ivonne Andrew, NP  HPI  Kirk [name] 67 y.o. male  has a past medical history of Arthritis, Atrial flutter with rapid ventricular response (HCC) (04/19/2019), Dizziness (10/2019), Dyspnea, Elevated serum creatinine (07/2020), HTN (hypertension), Hyperlipidemia, Lung cancer (HCC) (05/2019), Microalbuminuria (07/2020), NSTEMI (non-ST elevated myocardial infarction) (HCC) (09/04/2015), PAD (peripheral artery disease) (HCC), Refusal of blood transfusions as patient is Jehovah's Witness, Type II diabetes mellitus (HCC), and Vitamin D deficiency (07/2020). To the [facility] for reevaluation of DM2.      Diagnosed and treated for stage 4 lung cancer. States that he completed six rounds of chemotherapy and oral therapy. Was informed by oncologist that cancer is stable, but not in remission. Completes follow up every 3 mths for reevaluation of lung cancer. lung cancer still stable. Also, followed by nephrology.      Diabetes Mellitus: Patient presents for follow up of diabetes. Symptoms: none.  Patient denies foot ulcerations, hypoglycemia , nausea, and polydipsia.  Evaluation to date has been included: hemoglobin A1C.  Home sugars: BG. Treatment to date: no recent interventions. Denies monitoring meals, but walks his dog daily. Hgb A1c 7.0. Is compliant with medications. Patient does follow with nephrology.    Denies any other concerns today. Denies any fatigue, chest pain, shortness of breath, HA or dizziness. Denies any blurred vision, numbness or tingling.      Allergies  Allergen Reactions   Seasonal Ic [Octacosanol]     Immunization History  Administered Date(s) Administered   Fluad Quad(high Dose 65+) 03/21/2020, 03/15/2021, 03/08/2022   Influenza, High Dose Seasonal PF 03/22/2019   PFIZER(Purple Top)SARS-COV-2 Vaccination  01/18/2020, 07/05/2020, 08/02/2020, 09/27/2020   Pneumococcal Conjugate-13 03/22/2019    Past Medical History:  Diagnosis Date   Arthritis    "right knee" (09/04/2015)   Atrial flutter with rapid ventricular response (HCC) 04/19/2019   Dizziness 10/2019   Dyspnea    increased exertion   Elevated serum creatinine 07/2020   HTN (hypertension)    Hyperlipidemia    Kidney disease    stage 3B   Lung cancer (HCC) 05/2019   Microalbuminuria 07/2020   NSTEMI (non-ST elevated myocardial infarction) (HCC) 09/04/2015   a. cath 09/05/2015: 95% stenosis mid-LAD, 55% mid-RCA, and 40% prox-RCA. PCI performed w/ Synergy DES to LAD   PAD (peripheral artery disease) (HCC)    Stenting of bilateral iliacs in 2003.   Refusal of blood transfusions as patient is Jehovah's Witness    Type II diabetes mellitus (HCC)    Vitamin D deficiency 07/2020    Tobacco History: Social History   Tobacco Use  Smoking Status Former   Current packs/day: 0.00   Average packs/day: 2.0 packs/day for 35.0 years (70.0 ttl pk-yrs)   Types: Cigarettes   Start date: 02/19/1968   Quit date: 02/19/2003   Years since quitting: 19.9  Smokeless Tobacco Never   Counseling given: Not Answered   Outpatient Encounter Medications as of 01/10/2023  Medication Sig   acetaminophen (TYLENOL) 500 MG tablet Take 500 mg by mouth every 6 (six) hours as needed for moderate pain or fever.    aspirin EC 81 MG tablet Take 81 mg by mouth at bedtime.   benzonatate (TESSALON) 200 MG capsule Take 1 capsule (200 mg total) by mouth 2 (two) times daily as needed  for cough.   Blood Glucose Monitoring Suppl (ACCU-CHEK GUIDE) w/Device KIT USE UP TO FOUR TIMES DAILY AS DIRECTED. (FOR ICD-10 E10.9, E11.9).   carvedilol (COREG) 12.5 MG tablet TAKE 1 TABLET (12.5 MG TOTAL) BY MOUTH 2 (TWO) TIMES DAILY WITH A MEAL.   clopidogrel (PLAVIX) 75 MG tablet TAKE 1 TABLET BY MOUTH EVERY DAY   gabapentin (NEURONTIN) 300 MG capsule TAKE 1 CAPSULE BY MOUTH THREE  TIMES A DAY   glimepiride (AMARYL) 2 MG tablet TAKE 1 TABLET BY MOUTH EVERY DAY BEFORE BREAKFAST   Glucose Blood (BLOOD GLUCOSE TEST STRIPS) STRP 1 each by In Vitro route 3 (three) times daily with meals. May substitute to any manufacturer covered by patient's insurance.   JARDIANCE 25 MG TABS tablet TAKE 1 TABLET BY MOUTH DAILY BEFORE BREAKFAST.   metFORMIN (GLUCOPHAGE-XR) 500 MG 24 hr tablet Take 1 tablet (500 mg total) by mouth 2 (two) times daily with a meal.   Misc. Devices (ROLLATOR ULTRA-LIGHT) MISC 1 each by Does not apply route daily.   nitroGLYCERIN (NITROSTAT) 0.4 MG SL tablet Place 1 tablet (0.4 mg total) under the tongue every 5 (five) minutes x 3 doses as needed for chest pain.   rosuvastatin (CRESTOR) 40 MG tablet TAKE 1 TABLET BY MOUTH EVERY DAY   traZODone (DESYREL) 50 MG tablet TAKE 0.5-1 TABLETS BY MOUTH AT BEDTIME AS NEEDED FOR SLEEP.   ferrous sulfate 324 MG TBEC Take 1 tablet (324 mg total) by mouth daily with breakfast. (Patient not taking: Reported on 01/10/2023)   No facility-administered encounter medications on file as of 01/10/2023.     Review of Systems  Review of Systems  Constitutional: Negative.   HENT: Negative.    Cardiovascular: Negative.   Gastrointestinal: Negative.   Allergic/Immunologic: Negative.   Neurological: Negative.   Psychiatric/Behavioral: Negative.         Physical Exam  BP (!) 118/58   Pulse 74   Temp 98.3 F (36.8 C) (Oral)   Ht 6\' 3"  (1.905 m)   Wt 221 lb 12.8 oz (100.6 kg)   SpO2 98%   BMI 27.72 kg/m   Wt Readings from Last 5 Encounters:  01/10/23 221 lb 12.8 oz (100.6 kg)  11/14/22 220 lb 4.8 oz (99.9 kg)  10/10/22 223 lb 9.6 oz (101.4 kg)  09/04/22 225 lb (102.1 kg)  07/18/22 230 lb 7 oz (104.5 kg)     Physical Exam Vitals and nursing note reviewed.  Constitutional:      General: He is not in acute distress.    Appearance: He is well-developed.  Cardiovascular:     Rate and Rhythm: Normal rate and regular  rhythm.  Pulmonary:     Effort: Pulmonary effort is normal.     Breath sounds: Normal breath sounds.  Skin:    General: Skin is warm and dry.  Neurological:     Mental Status: He is alert and oriented to person, place, and time.      Lab Results:  CBC    Component Value Date/Time   WBC 6.1 11/12/2022 0947   WBC 9.2 12/25/2019 1850   RBC 4.33 11/12/2022 0947   HGB 13.1 11/12/2022 0947   HCT 39.4 11/12/2022 0947   PLT 235 11/12/2022 0947   MCV 91.0 11/12/2022 0947   MCH 30.3 11/12/2022 0947   MCHC 33.2 11/12/2022 0947   RDW 13.2 11/12/2022 0947   LYMPHSABS 2.0 11/12/2022 0947   MONOABS 0.7 11/12/2022 0947   EOSABS 0.1 11/12/2022 0947  BASOSABS 0.0 11/12/2022 0947    BMET    Component Value Date/Time   NA 142 11/12/2022 0947   NA 140 06/08/2015 0840   K 4.2 11/12/2022 0947   CL 109 11/12/2022 0947   CO2 27 11/12/2022 0947   GLUCOSE 144 (H) 11/12/2022 0947   BUN 21 11/12/2022 0947   BUN 15 06/08/2015 0840   CREATININE 1.40 (H) 11/12/2022 0947   CREATININE 1.06 10/01/2016 0855   CALCIUM 9.7 11/12/2022 0947   GFRNONAA 55 (L) 11/12/2022 0947   GFRNONAA 75 10/01/2016 0855   GFRAA 58 (L) 02/10/2020 1120   GFRAA 87 10/01/2016 0855     Assessment & Plan:   No problem-specific Assessment & Plan notes found for this encounter.     Ivonne Andrew, NP 01/10/2023

## 2023-01-15 ENCOUNTER — Ambulatory Visit: Payer: Self-pay | Admitting: Podiatry

## 2023-01-16 ENCOUNTER — Inpatient Hospital Stay: Payer: Medicare Other | Attending: Internal Medicine

## 2023-01-16 ENCOUNTER — Encounter: Payer: Self-pay | Admitting: Nurse Practitioner

## 2023-01-16 DIAGNOSIS — C787 Secondary malignant neoplasm of liver and intrahepatic bile duct: Secondary | ICD-10-CM | POA: Insufficient documentation

## 2023-01-16 DIAGNOSIS — C3490 Malignant neoplasm of unspecified part of unspecified bronchus or lung: Secondary | ICD-10-CM | POA: Insufficient documentation

## 2023-01-16 NOTE — Assessment & Plan Note (Signed)
-   POCT glycosylated hemoglobin (Hb A1C) - Ambulatory referral to Podiatry    Follow up:  Follow up in 3 months

## 2023-01-23 ENCOUNTER — Inpatient Hospital Stay: Payer: Medicare Other

## 2023-01-23 DIAGNOSIS — C3490 Malignant neoplasm of unspecified part of unspecified bronchus or lung: Secondary | ICD-10-CM | POA: Diagnosis present

## 2023-01-23 DIAGNOSIS — C787 Secondary malignant neoplasm of liver and intrahepatic bile duct: Secondary | ICD-10-CM | POA: Diagnosis not present

## 2023-01-23 DIAGNOSIS — Z95828 Presence of other vascular implants and grafts: Secondary | ICD-10-CM

## 2023-01-23 MED ORDER — HEPARIN SOD (PORK) LOCK FLUSH 100 UNIT/ML IV SOLN
500.0000 [IU] | Freq: Once | INTRAVENOUS | Status: AC
  Administered 2023-01-23: 500 [IU]

## 2023-01-23 MED ORDER — SODIUM CHLORIDE 0.9% FLUSH
10.0000 mL | Freq: Once | INTRAVENOUS | Status: AC
  Administered 2023-01-23: 10 mL

## 2023-02-02 ENCOUNTER — Telehealth: Payer: Medicare Other | Admitting: Nurse Practitioner

## 2023-02-02 DIAGNOSIS — J069 Acute upper respiratory infection, unspecified: Secondary | ICD-10-CM

## 2023-02-02 MED ORDER — PROMETHAZINE-DM 6.25-15 MG/5ML PO SYRP
5.0000 mL | ORAL_SOLUTION | Freq: Four times a day (QID) | ORAL | 0 refills | Status: DC | PRN
Start: 2023-02-02 — End: 2023-06-12

## 2023-02-02 NOTE — Patient Instructions (Signed)
Kirk [name], thank you for joining [name], NP for today's virtual visit.  While this provider is not your primary care provider (PCP), if your PCP is located in our provider database this encounter information will be shared with them immediately following your visit.   A [facility] MyChart account gives you access to today's visit and all your visits, tests, and labs performed at [facility] " click here if you don't have a  MyChart account or go to [email or web address]  Consent: (Patient) Kirk [name] provided verbal consent for this virtual visit at the beginning of the encounter.  Current Medications:  Current Outpatient Medications:    promethazine-dextromethorphan (PROMETHAZINE-DM) 6.25-15 MG/5ML syrup, Take 5 mLs by mouth 4 (four) times daily as needed., Disp: 240 mL, Rfl: 0   acetaminophen (TYLENOL) 500 MG tablet, Take 500 mg by mouth every 6 (six) hours as needed for moderate pain or fever. , Disp: , Rfl:    aspirin EC 81 MG tablet, Take 81 mg by mouth at bedtime., Disp: , Rfl:    benzonatate (TESSALON) 200 MG capsule, Take 1 capsule (200 mg total) by mouth 2 (two) times daily as needed for cough., Disp: 20 capsule, Rfl: 0   Blood Glucose Monitoring Suppl (ACCU-CHEK GUIDE) w/Device KIT, USE UP TO FOUR TIMES DAILY AS DIRECTED. (FOR ICD-10 E10.9, E11.9)., Disp: 1 kit, Rfl: 0   carvedilol (COREG) 12.5 MG tablet, TAKE 1 TABLET (12.5 MG TOTAL) BY MOUTH 2 (TWO) TIMES DAILY WITH A MEAL., Disp: 180 tablet, Rfl: 3   clopidogrel (PLAVIX) 75 MG tablet, TAKE 1 TABLET BY MOUTH EVERY DAY, Disp: 90 tablet, Rfl: 1   ferrous sulfate 324 MG TBEC, Take 1 tablet (324 mg total) by mouth daily with breakfast. (Patient not taking: Reported on 01/10/2023), Disp: 30 tablet, Rfl: 11   gabapentin (NEURONTIN) 300 MG capsule, TAKE 1 CAPSULE BY MOUTH THREE TIMES A DAY, Disp: 270 capsule, Rfl: 3   glimepiride (AMARYL) 2 MG tablet, TAKE 1 TABLET BY MOUTH EVERY DAY BEFORE  BREAKFAST, Disp: 90 tablet, Rfl: 0   Glucose Blood (BLOOD GLUCOSE TEST STRIPS) STRP, 1 each by In Vitro route 3 (three) times daily with meals. May substitute to any manufacturer covered by patient's insurance., Disp: 100 strip, Rfl: 3   JARDIANCE 25 MG TABS tablet, TAKE 1 TABLET BY MOUTH DAILY BEFORE BREAKFAST., Disp: 90 tablet, Rfl: 1   metFORMIN (GLUCOPHAGE-XR) 500 MG 24 hr tablet, Take 1 tablet (500 mg total) by mouth 2 (two) times daily with a meal., Disp: 180 tablet, Rfl: 3   Misc. Devices (ROLLATOR ULTRA-LIGHT) MISC, 1 each by Does not apply route daily., Disp: 1 each, Rfl: 0   nitroGLYCERIN (NITROSTAT) 0.4 MG SL tablet, Place 1 tablet (0.4 mg total) under the tongue every 5 (five) minutes x 3 doses as needed for chest pain., Disp: 25 tablet, Rfl: 2   rosuvastatin (CRESTOR) 40 MG tablet, TAKE 1 TABLET BY MOUTH EVERY DAY, Disp: 90 tablet, Rfl: 3   traZODone (DESYREL) 50 MG tablet, TAKE 0.5-1 TABLETS BY MOUTH AT BEDTIME AS NEEDED FOR SLEEP., Disp: 90 tablet, Rfl: 2   Medications ordered in this encounter:  Meds ordered this encounter  Medications   promethazine-dextromethorphan (PROMETHAZINE-DM) 6.25-15 MG/5ML syrup    Sig: Take 5 mLs by mouth 4 (four) times daily as needed.    Dispense:  240 mL    Refill:  0    Order Specific Question:   Supervising Provider    Answer:  Merrilee Jansky [[id number]]     *If you need refills on other medications prior to your next appointment, please contact your pharmacy*  Follow-Up: Call back or seek an in-person evaluation if the symptoms worsen or if the condition fails to improve as anticipated.  [facility] [phone]  Other Instructions INSTRUCTIONS: use a humidifier for nasal congestion Drink plenty of fluids, rest and wash hands frequently to avoid the spread of infection Alternate tylenol and Motrin for relief of fever    If you have been instructed to have an in-person evaluation today at a local Urgent Care facility,  please use the link below. It will take you to a list of all of our available [facility], including address, phone number and hours of operation. Please do not delay care.  [facility]  If you or a family member do not have a primary care provider, use the link below to schedule a visit and establish care. When you choose a [facility] primary care physician or advanced practice provider, you gain a long-term partner in health. Find a Primary Care Provider  Learn more about [facility] in-office and virtual care options: [facility] - Get Care Now

## 2023-02-02 NOTE — Progress Notes (Signed)
Virtual Visit Consent   Kirk [name], you are scheduled for a virtual visit with a [facility] provider today. Just as with appointments in the office, your consent must be obtained to participate. Your consent will be active for this visit and any virtual visit you may have with one of our providers in the next 365 days. If you have a MyChart account, a copy of this consent can be sent to you electronically.  As this is a virtual visit, video technology does not allow for your provider to perform a traditional examination. This may limit your provider's ability to fully assess your condition. If your provider identifies any concerns that need to be evaluated in person or the need to arrange testing (such as labs, EKG, etc.), we will make arrangements to do so. Although advances in technology are sophisticated, we cannot ensure that it will always work on either your end or our end. If the connection with a video visit is poor, the visit may have to be switched to a telephone visit. With either a video or telephone visit, we are not always able to ensure that we have a secure connection.  By engaging in this virtual visit, you consent to the provision of healthcare and authorize for your insurance to be billed (if applicable) for the services provided during this visit. Depending on your insurance coverage, you may receive a charge related to this service.  I need to obtain your verbal consent now. Are you willing to proceed with your visit today? Kirk [name] has provided verbal consent on 02/02/2023 for a virtual visit (video or telephone). [name], NP  Date: 02/02/2023 12:28 PM  Virtual Visit via Video Note   I, [name], connected with  Kirk [name]  ([id number], Aug 01, 1955) on 02/02/23 at 12:00 PM EDT by a video-enabled telemedicine application and verified that I am speaking with the correct person using two identifiers.  Location: Patient: Virtual Visit Location Patient:  Home Provider: Virtual Visit Location Provider: Home Office   I discussed the limitations of evaluation and management by telemedicine and the availability of in person appointments. The patient expressed understanding and agreed to proceed.    History of Present Illness: Kirk [name] is a 67 y.o. who identifies as a male who was assigned male at birth, and is being seen today for uri with cough and congestion.  Upper Respiratory Infection: Patient complains of symptoms of a URI. Symptoms include cough with congestion. Onset of symptoms was several days ago, unchanged since that time. He denies facial pain, low grade fever, post nasal drip, purulent nasal discharge, shortness of breath, sinus pressure, and sore throat.He is drinking plenty of fluids. Evaluation to date: none. Treatment to date:  OTC cough syrups and tessalon .    Problems:  Patient Active Problem List   Diagnosis Date Noted   Upper respiratory tract infection 07/11/2022   Iron deficiency anemia 03/28/2022   Need for immunization against influenza 03/08/2022   Chemotherapy-induced neuropathy (HCC) 05/31/2021   Drug-induced neutropenia (HCC) 12/26/2020   Port-A-Cath in place 12/29/2019   Metastatic non-small cell lung cancer (HCC) 07/07/2019   Encounter for antineoplastic chemotherapy 07/07/2019   Encounter for antineoplastic immunotherapy 07/07/2019   Goals of care, counseling/discussion 06/28/2019   Adenocarcinoma of right lung, stage 4 (HCC) 06/28/2019   Cancer associated pain 06/28/2019   CAD (coronary artery disease) 06/08/2019   Mediastinal lymphadenopathy 06/03/2019   Liver metastasis 06/03/2019   Hemoglobin A1C between  7% and 9% indicating borderline diabetic control (HCC) 05/10/2019   COVID-19 virus infection 05/10/2019   Atrial flutter with rapid ventricular response (HCC) 04/19/2019   Chest pain with moderate risk for cardiac etiology 04/19/2019   Paroxysmal atrial flutter (HCC) 04/19/2019   Chest  congestion 07/10/2018   Cough 07/10/2018   NSTEMI (non-ST elevated myocardial infarction) (HCC) 09/04/2015   PVD (peripheral vascular disease) (HCC) 06/08/2015   DDD (degenerative disc disease), cervical 06/08/2015   Hyperlipidemia 06/08/2015   Diabetes mellitus with nephropathy (HCC) 06/08/2015   Diabetes mellitus with peripheral vascular disease (HCC) 06/08/2015   Annual physical exam 01/03/2015   PAD (peripheral artery disease) (HCC) 02/18/2013   Diabetes (HCC) 02/18/2013   HTN (hypertension) 02/18/2013    Allergies:  Allergies  Allergen Reactions   Seasonal Ic [Octacosanol]    Medications:  Current Outpatient Medications:    promethazine-dextromethorphan (PROMETHAZINE-DM) 6.25-15 MG/5ML syrup, Take 5 mLs by mouth 4 (four) times daily as needed., Disp: 240 mL, Rfl: 0   acetaminophen (TYLENOL) 500 MG tablet, Take 500 mg by mouth every 6 (six) hours as needed for moderate pain or fever. , Disp: , Rfl:    aspirin EC 81 MG tablet, Take 81 mg by mouth at bedtime., Disp: , Rfl:    benzonatate (TESSALON) 200 MG capsule, Take 1 capsule (200 mg total) by mouth 2 (two) times daily as needed for cough., Disp: 20 capsule, Rfl: 0   Blood Glucose Monitoring Suppl (ACCU-CHEK GUIDE) w/Device KIT, USE UP TO FOUR TIMES DAILY AS DIRECTED. (FOR ICD-10 E10.9, E11.9)., Disp: 1 kit, Rfl: 0   carvedilol (COREG) 12.5 MG tablet, TAKE 1 TABLET (12.5 MG TOTAL) BY MOUTH 2 (TWO) TIMES DAILY WITH A MEAL., Disp: 180 tablet, Rfl: 3   clopidogrel (PLAVIX) 75 MG tablet, TAKE 1 TABLET BY MOUTH EVERY DAY, Disp: 90 tablet, Rfl: 1   ferrous sulfate 324 MG TBEC, Take 1 tablet (324 mg total) by mouth daily with breakfast. (Patient not taking: Reported on 01/10/2023), Disp: 30 tablet, Rfl: 11   gabapentin (NEURONTIN) 300 MG capsule, TAKE 1 CAPSULE BY MOUTH THREE TIMES A DAY, Disp: 270 capsule, Rfl: 3   glimepiride (AMARYL) 2 MG tablet, TAKE 1 TABLET BY MOUTH EVERY DAY BEFORE BREAKFAST, Disp: 90 tablet, Rfl: 0   Glucose Blood  (BLOOD GLUCOSE TEST STRIPS) STRP, 1 each by In Vitro route 3 (three) times daily with meals. May substitute to any manufacturer covered by patient's insurance., Disp: 100 strip, Rfl: 3   JARDIANCE 25 MG TABS tablet, TAKE 1 TABLET BY MOUTH DAILY BEFORE BREAKFAST., Disp: 90 tablet, Rfl: 1   metFORMIN (GLUCOPHAGE-XR) 500 MG 24 hr tablet, Take 1 tablet (500 mg total) by mouth 2 (two) times daily with a meal., Disp: 180 tablet, Rfl: 3   Misc. Devices (ROLLATOR ULTRA-LIGHT) MISC, 1 each by Does not apply route daily., Disp: 1 each, Rfl: 0   nitroGLYCERIN (NITROSTAT) 0.4 MG SL tablet, Place 1 tablet (0.4 mg total) under the tongue every 5 (five) minutes x 3 doses as needed for chest pain., Disp: 25 tablet, Rfl: 2   rosuvastatin (CRESTOR) 40 MG tablet, TAKE 1 TABLET BY MOUTH EVERY DAY, Disp: 90 tablet, Rfl: 3   traZODone (DESYREL) 50 MG tablet, TAKE 0.5-1 TABLETS BY MOUTH AT BEDTIME AS NEEDED FOR SLEEP., Disp: 90 tablet, Rfl: 2  Observations/Objective: Patient is well-developed, well-nourished in no acute distress.  Resting comfortably at home.  Head is normocephalic, atraumatic.  No labored breathing.  Speech is clear and coherent with  logical content.  Patient is alert and oriented at baseline.    Assessment and Plan: 1. URI with cough and congestion - promethazine-dextromethorphan (PROMETHAZINE-DM) 6.25-15 MG/5ML syrup; Take 5 mLs by mouth 4 (four) times daily as needed.  Dispense: 240 mL; Refill: 0  INSTRUCTIONS: use a humidifier for nasal congestion Drink plenty of fluids, rest and wash hands frequently to avoid the spread of infection Alternate tylenol and Motrin for relief of fever   Follow Up Instructions: I discussed the assessment and treatment plan with the patient. The patient was provided an opportunity to ask questions and all were answered. The patient agreed with the plan and demonstrated an understanding of the instructions.  A copy of instructions were sent to the patient via  MyChart unless otherwise noted below.    The patient was advised to call back or seek an in-person evaluation if the symptoms worsen or if the condition fails to improve as anticipated.  Time:  I spent 11 minutes with the patient via telehealth technology discussing the above problems/concerns.    [name], NP

## 2023-03-13 ENCOUNTER — Inpatient Hospital Stay: Payer: Medicare Other | Attending: Internal Medicine

## 2023-03-13 ENCOUNTER — Ambulatory Visit (HOSPITAL_COMMUNITY)
Admission: RE | Admit: 2023-03-13 | Discharge: 2023-03-13 | Disposition: A | Discharge status: Home / Self Care | Payer: Medicare Other | Source: Ambulatory Visit | Attending: Internal Medicine | Admitting: Internal Medicine

## 2023-03-13 DIAGNOSIS — D649 Anemia, unspecified: Secondary | ICD-10-CM | POA: Diagnosis not present

## 2023-03-13 DIAGNOSIS — C3490 Malignant neoplasm of unspecified part of unspecified bronchus or lung: Secondary | ICD-10-CM | POA: Insufficient documentation

## 2023-03-13 DIAGNOSIS — Z23 Encounter for immunization: Secondary | ICD-10-CM | POA: Insufficient documentation

## 2023-03-13 DIAGNOSIS — Z95828 Presence of other vascular implants and grafts: Secondary | ICD-10-CM

## 2023-03-13 DIAGNOSIS — N289 Disorder of kidney and ureter, unspecified: Secondary | ICD-10-CM | POA: Diagnosis not present

## 2023-03-13 DIAGNOSIS — Z9221 Personal history of antineoplastic chemotherapy: Secondary | ICD-10-CM | POA: Insufficient documentation

## 2023-03-13 LAB — CMP (CANCER CENTER ONLY)
ALT: 13 U/L (ref 0–44)
AST: 14 U/L — ABNORMAL LOW (ref 15–41)
Albumin: 4.3 g/dL (ref 3.5–5.0)
Alkaline Phosphatase: 67 U/L (ref 38–126)
Anion gap: 6 (ref 5–15)
BUN: 21 mg/dL (ref 8–23)
CO2: 28 mmol/L (ref 22–32)
Calcium: 9.7 mg/dL (ref 8.9–10.3)
Chloride: 107 mmol/L (ref 98–111)
Creatinine: 1.47 mg/dL — ABNORMAL HIGH (ref 0.61–1.24)
GFR, Estimated: 51 mL/min — ABNORMAL LOW (ref >60)
Glucose, Bld: 130 mg/dL — ABNORMAL HIGH (ref 70–99)
Potassium: 4.1 mmol/L (ref 3.5–5.1)
Sodium: 141 mmol/L (ref 135–145)
Total Bilirubin: 0.4 mg/dL (ref 0.3–1.2)
Total Protein: 7.8 g/dL (ref 6.5–8.1)

## 2023-03-13 LAB — CBC WITH DIFFERENTIAL (CANCER CENTER ONLY)
Abs Immature Granulocytes: 0.01 10*3/uL (ref 0.00–0.07)
Basophils Absolute: 0.1 10*3/uL (ref 0.0–0.1)
Basophils Relative: 1 %
Eosinophils Absolute: 0.2 10*3/uL (ref 0.0–0.5)
Eosinophils Relative: 3 %
HCT: 39.1 % (ref 39.0–52.0)
Hemoglobin: 12.8 g/dL — ABNORMAL LOW (ref 13.0–17.0)
Immature Granulocytes: 0 %
Lymphocytes Relative: 38 %
Lymphs Abs: 2.2 10*3/uL (ref 0.7–4.0)
MCH: 29.7 pg (ref 26.0–34.0)
MCHC: 32.7 g/dL (ref 30.0–36.0)
MCV: 90.7 fL (ref 80.0–100.0)
Monocytes Absolute: 0.5 10*3/uL (ref 0.1–1.0)
Monocytes Relative: 10 %
Neutro Abs: 2.7 10*3/uL (ref 1.7–7.7)
Neutrophils Relative %: 48 %
Platelet Count: 220 10*3/uL (ref 150–400)
RBC: 4.31 MIL/uL (ref 4.22–5.81)
RDW: 13.2 % (ref 11.5–15.5)
WBC Count: 5.6 10*3/uL (ref 4.0–10.5)
nRBC: 0 % (ref 0.0–0.2)

## 2023-03-13 MED ORDER — SODIUM CHLORIDE 0.9% FLUSH
10.0000 mL | Freq: Once | INTRAVENOUS | Status: AC
  Administered 2023-03-13: 10 mL

## 2023-03-13 MED ORDER — HEPARIN SOD (PORK) LOCK FLUSH 100 UNIT/ML IV SOLN
500.0000 [IU] | Freq: Once | INTRAVENOUS | Status: AC
  Administered 2023-03-13: 500 [IU]

## 2023-03-18 ENCOUNTER — Inpatient Hospital Stay: Payer: Medicare Other | Admitting: Internal Medicine

## 2023-03-18 ENCOUNTER — Inpatient Hospital Stay (HOSPITAL_BASED_OUTPATIENT_CLINIC_OR_DEPARTMENT_OTHER): Payer: Medicare Other | Admitting: Internal Medicine

## 2023-03-18 VITALS — BP 149/65 | HR 81 | Temp 97.7°F | Resp 18 | Ht 75.0 in | Wt 219.6 lb

## 2023-03-18 DIAGNOSIS — D649 Anemia, unspecified: Secondary | ICD-10-CM | POA: Diagnosis not present

## 2023-03-18 DIAGNOSIS — C3490 Malignant neoplasm of unspecified part of unspecified bronchus or lung: Secondary | ICD-10-CM | POA: Diagnosis not present

## 2023-03-18 DIAGNOSIS — R2681 Unsteadiness on feet: Secondary | ICD-10-CM | POA: Diagnosis not present

## 2023-03-18 DIAGNOSIS — C787 Secondary malignant neoplasm of liver and intrahepatic bile duct: Secondary | ICD-10-CM

## 2023-03-18 DIAGNOSIS — N2889 Other specified disorders of kidney and ureter: Secondary | ICD-10-CM

## 2023-03-18 DIAGNOSIS — Z23 Encounter for immunization: Secondary | ICD-10-CM

## 2023-03-18 MED ORDER — INFLUENZA VAC A&B SURF ANT ADJ 0.5 ML IM SUSY
0.5000 mL | PREFILLED_SYRINGE | Freq: Once | INTRAMUSCULAR | Status: AC
  Administered 2023-03-18: 0.5 mL via INTRAMUSCULAR
  Filled 2023-03-18: qty 0.5

## 2023-03-18 NOTE — Progress Notes (Signed)
[facility] [phone]   Fax:([phone]) [phone]  OFFICE PROGRESS NOTE  Ivonne Andrew, NP [address] [place] [address]  DIAGNOSIS: Stage IV (TX, N3, M1c) non-small cell lung cancer, poorly differentiated adenocarcinoma presented with bulky mediastinal lymphadenopathy in addition to right axillary lymphadenopathy and metastatic liver lesions, abdominal lymphadenopathy diagnosed in January 2021. Molecular studies by Guardant 360 that shows no actionable mutations.  PRIOR THERAPY: Systemic chemotherapy with carboplatin for AUC of 5, Alimta 500 mg/M2 and Keytruda 200 mg IV every 3 weeks.  First dose July 14, 2019.  Status post 35  cycles. Starting from cycle #5 the patient will be treated with maintenance Alimta and Keytruda every 3 weeks.  Starting from cycle #12 he will be on single agent treatment with Keytruda secondary to renal insufficiency.  CURRENT THERAPY: Observation  INTERVAL HISTORY: DAWID SWICEGOOD 67 y.o. male returns to the clinic today for 65-month follow-up visit. Discussed the use of AI scribe software for clinical note transcription with the patient, who gave verbal consent to proceed.  History of Present Illness   Kirk [name], a 67 year old patient, was diagnosed with stage four adenocarcinoma of the lung in January 2021. The patient did not have a mutation and was started on a chemotherapy regimen of carboplatin, Alimta, and Keytruda. After four cycles, the treatment was adjusted to Alimta and Keytruda, and eventually, due to declining kidney function, the patient was maintained on Keytruda alone. The patient completed two years of Keytruda treatment approximately a year ago and has since been under observation.  The patient reports feeling generally well, with no new complaints of chest pain, breathing issues, nausea, vomiting, or diarrhea. However, he has been experiencing issues with balance, occasionally accompanied by  dizziness, but denies any focal neurological issues.  The patient has a history of anemia, which is slightly lower than normal at present. He has previously received an iron infusion, which effectively resolved his craving for ice, a common symptom of iron deficiency. The patient is aware that a return of this craving may indicate a drop in his iron levels.  The patient has been managing his condition well and is scheduled for a follow-up in four months with another set of scans.      MEDICAL HISTORY: Past Medical History:  Diagnosis Date   Arthritis    "right knee" (09/04/2015)   Atrial flutter with rapid ventricular response (HCC) 04/19/2019   Dizziness 10/2019   Dyspnea    increased exertion   Elevated serum creatinine 07/2020   HTN (hypertension)    Hyperlipidemia    Kidney disease    stage 3B   Lung cancer (HCC) 05/2019   Microalbuminuria 07/2020   NSTEMI (non-ST elevated myocardial infarction) (HCC) 09/04/2015   a. cath 09/05/2015: 95% stenosis mid-LAD, 55% mid-RCA, and 40% prox-RCA. PCI performed w/ Synergy DES to LAD   PAD (peripheral artery disease) (HCC)    Stenting of bilateral iliacs in 2003.   Refusal of blood transfusions as patient is Jehovah's Witness    Type II diabetes mellitus (HCC)    Vitamin D deficiency 07/2020    ALLERGIES:  is allergic to seasonal ic [octacosanol].  MEDICATIONS:  Current Outpatient Medications  Medication Sig Dispense Refill   acetaminophen (TYLENOL) 500 MG tablet Take 500 mg by mouth every 6 (six) hours as needed for moderate pain or fever.      aspirin EC 81 MG tablet Take 81 mg by mouth at  bedtime.     benzonatate (TESSALON) 200 MG capsule Take 1 capsule (200 mg total) by mouth 2 (two) times daily as needed for cough. 20 capsule 0   Blood Glucose Monitoring Suppl (ACCU-CHEK GUIDE) w/Device KIT USE UP TO FOUR TIMES DAILY AS DIRECTED. (FOR ICD-10 E10.9, E11.9). 1 kit 0   carvedilol (COREG) 12.5 MG tablet TAKE 1 TABLET (12.5 MG TOTAL)  BY MOUTH 2 (TWO) TIMES DAILY WITH A MEAL. 180 tablet 3   clopidogrel (PLAVIX) 75 MG tablet TAKE 1 TABLET BY MOUTH EVERY DAY 90 tablet 1   ferrous sulfate 324 MG TBEC Take 1 tablet (324 mg total) by mouth daily with breakfast. (Patient not taking: Reported on 01/10/2023) 30 tablet 11   gabapentin (NEURONTIN) 300 MG capsule TAKE 1 CAPSULE BY MOUTH THREE TIMES A DAY 270 capsule 3   glimepiride (AMARYL) 2 MG tablet TAKE 1 TABLET BY MOUTH EVERY DAY BEFORE BREAKFAST 90 tablet 0   Glucose Blood (BLOOD GLUCOSE TEST STRIPS) STRP 1 each by In Vitro route 3 (three) times daily with meals. May substitute to any manufacturer covered by patient's insurance. 100 strip 3   JARDIANCE 25 MG TABS tablet TAKE 1 TABLET BY MOUTH DAILY BEFORE BREAKFAST. 90 tablet 1   metFORMIN (GLUCOPHAGE-XR) 500 MG 24 hr tablet Take 1 tablet (500 mg total) by mouth 2 (two) times daily with a meal. 180 tablet 3   Misc. Devices (ROLLATOR ULTRA-LIGHT) MISC 1 each by Does not apply route daily. 1 each 0   nitroGLYCERIN (NITROSTAT) 0.4 MG SL tablet Place 1 tablet (0.4 mg total) under the tongue every 5 (five) minutes x 3 doses as needed for chest pain. 25 tablet 2   promethazine-dextromethorphan (PROMETHAZINE-DM) 6.25-15 MG/5ML syrup Take 5 mLs by mouth 4 (four) times daily as needed. 240 mL 0   rosuvastatin (CRESTOR) 40 MG tablet TAKE 1 TABLET BY MOUTH EVERY DAY 90 tablet 3   traZODone (DESYREL) 50 MG tablet TAKE 0.5-1 TABLETS BY MOUTH AT BEDTIME AS NEEDED FOR SLEEP. 90 tablet 2   No current facility-administered medications for this visit.    SURGICAL HISTORY:  Past Surgical History:  Procedure Laterality Date   CARDIAC CATHETERIZATION N/A 09/05/2015   Procedure: Left Heart Cath and Coronary Angiography;  Surgeon: Lennette Bihari, MD;  Location: [facility] INVASIVE CV LAB;  Service: Cardiovascular;  Laterality: N/A;   CARDIAC CATHETERIZATION N/A 09/05/2015   Procedure: Coronary Stent Intervention;  Surgeon: Lennette Bihari, MD;  Location: [facility]  INVASIVE CV LAB;  Service: Cardiovascular;  Laterality: N/A;   IR IMAGING GUIDED PORT INSERTION  07/15/2019   ORIF CONGENITAL HIP DISLOCATION Left ~ 1965   "put pins in it"   PERIPHERAL VASCULAR CATHETERIZATION Bilateral 2003   "stenting"   TONSILLECTOMY  ~ 1963    REVIEW OF SYSTEMS:  A comprehensive review of systems was negative except for: Neurological: positive for dizziness   PHYSICAL EXAMINATION: General appearance: alert, cooperative, appears stated age, and no distress Head: Normocephalic, without obvious abnormality, atraumatic Neck: no adenopathy, no JVD, supple, symmetrical, trachea midline, and thyroid not enlarged, symmetric, no tenderness/mass/nodules Lymph nodes: Cervical, supraclavicular, and axillary nodes normal. Resp: clear to auscultation bilaterally Back: symmetric, no curvature. ROM normal. No CVA tenderness. Cardio: regular rate and rhythm, S1, S2 normal, no murmur, click, rub or gallop GI: soft, non-tender; bowel sounds normal; no masses,  no organomegaly Extremities: extremities normal, atraumatic, no cyanosis or edema  ECOG PERFORMANCE STATUS: 1 - Symptomatic but completely ambulatory  Blood pressure [name])  149/65, pulse 81, temperature 97.7 F (36.5 C), temperature source Oral, resp. rate 18, height 6\' 3"  (1.905 m), weight 219 lb 9 oz (99.6 kg), SpO2 96%.  LABORATORY DATA: Lab Results  Component Value Date   WBC 5.6 03/13/2023   HGB 12.8 (L) 03/13/2023   HCT 39.1 03/13/2023   MCV 90.7 03/13/2023   PLT 220 03/13/2023      Chemistry      Component Value Date/Time   NA 141 03/13/2023 1043   NA 140 06/08/2015 0840   K 4.1 03/13/2023 1043   CL 107 03/13/2023 1043   CO2 28 03/13/2023 1043   BUN 21 03/13/2023 1043   BUN 15 06/08/2015 0840   CREATININE 1.47 (H) 03/13/2023 1043   CREATININE 1.06 10/01/2016 0855      Component Value Date/Time   CALCIUM 9.7 03/13/2023 1043   ALKPHOS 67 03/13/2023 1043   AST 14 (L) 03/13/2023 1043   ALT 13 03/13/2023  1043   BILITOT 0.4 03/13/2023 1043       RADIOGRAPHIC STUDIES: CT Chest Wo Contrast  Result Date: 03/18/2023 CLINICAL DATA:  Non-small-cell lung cancer. Restaging. * Tracking Code: BO * EXAM: CT CHEST, ABDOMEN AND PELVIS WITHOUT CONTRAST TECHNIQUE: Multidetector CT imaging of the chest, abdomen and pelvis was performed following the standard protocol without IV contrast. RADIATION DOSE REDUCTION: This exam was performed according to the departmental dose-optimization program which includes automated exposure control, adjustment of the mA and/or kV according to patient size and/or use of iterative reconstruction technique. COMPARISON:  11/12/2022 FINDINGS: CT CHEST FINDINGS Cardiovascular: The heart size is normal. No substantial pericardial effusion. Coronary artery calcification is evident. Moderate atherosclerotic calcification is noted in the wall of the thoracic aorta. Right-sided Port-A-Cath tip is positioned in the SVC/RA junction. Mediastinum/Nodes: No mediastinal lymphadenopathy. No evidence for gross hilar lymphadenopathy although assessment is limited by the lack of intravenous contrast on the current study. The esophagus has normal imaging features. There is no axillary lymphadenopathy. Lungs/Pleura: Centrilobular emphsyema noted. Scarring again noted right middle lobe without substantial change. No new suspicious pulmonary nodule or mass. No focal airspace consolidation. No pleural effusion. Musculoskeletal: No worrisome lytic or sclerotic osseous abnormality. CT ABDOMEN PELVIS FINDINGS Hepatobiliary: 2.6 cm subcapsular lesion in the inferior right liver on 70/2 is stable and has been previously characterized as benign cavernous hemangioma. There is no evidence for gallstones, gallbladder wall thickening, or pericholecystic fluid. No intrahepatic or extrahepatic biliary dilation. Pancreas: No focal mass lesion. No dilatation of the main duct. No intraparenchymal cyst. No peripancreatic edema.  Spleen: No splenomegaly. No suspicious focal mass lesion. Adrenals/Urinary Tract: No adrenal nodule or mass. Kidneys unremarkable. No evidence for hydroureter. The urinary bladder appears normal for the degree of distention. Stomach/Bowel: Stomach is unremarkable. No gastric wall thickening. No evidence of outlet obstruction. Duodenum is normally positioned as is the ligament of Treitz. No small bowel wall thickening. No small bowel dilatation. The terminal ileum is normal. The appendix is normal. No gross colonic mass. No colonic wall thickening. Vascular/Lymphatic: There is moderate atherosclerotic calcification of the abdominal aorta without aneurysm. Bilateral common iliac artery stent device is noted. There is no gastrohepatic or hepatoduodenal ligament lymphadenopathy. No retroperitoneal or mesenteric lymphadenopathy. Small lymph nodes in the gastrohepatic ligament are stable. No pelvic sidewall lymphadenopathy. Reproductive: The prostate gland and seminal vesicles are unremarkable. Other: No intraperitoneal free fluid. Musculoskeletal: Fixation hardware noted proximal left femur No worrisome lytic or sclerotic osseous abnormality. IMPRESSION: 1. Stable exam. No new or progressive findings  to suggest recurrent or metastatic disease in the chest, abdomen, or pelvis. 2. Stable scarring right middle lobe. 3. Aortic Atherosclerosis (ICD10-I70.0) and Emphysema (ICD10-J43.9). Electronically Signed   By: Kennith Center M.D.   On: 03/18/2023 09:00   CT Abdomen Pelvis Wo Contrast  Result Date: 03/18/2023 CLINICAL DATA:  Non-small-cell lung cancer. Restaging. * Tracking Code: BO * EXAM: CT CHEST, ABDOMEN AND PELVIS WITHOUT CONTRAST TECHNIQUE: Multidetector CT imaging of the chest, abdomen and pelvis was performed following the standard protocol without IV contrast. RADIATION DOSE REDUCTION: This exam was performed according to the departmental dose-optimization program which includes automated exposure control,  adjustment of the mA and/or kV according to patient size and/or use of iterative reconstruction technique. COMPARISON:  11/12/2022 FINDINGS: CT CHEST FINDINGS Cardiovascular: The heart size is normal. No substantial pericardial effusion. Coronary artery calcification is evident. Moderate atherosclerotic calcification is noted in the wall of the thoracic aorta. Right-sided Port-A-Cath tip is positioned in the SVC/RA junction. Mediastinum/Nodes: No mediastinal lymphadenopathy. No evidence for gross hilar lymphadenopathy although assessment is limited by the lack of intravenous contrast on the current study. The esophagus has normal imaging features. There is no axillary lymphadenopathy. Lungs/Pleura: Centrilobular emphsyema noted. Scarring again noted right middle lobe without substantial change. No new suspicious pulmonary nodule or mass. No focal airspace consolidation. No pleural effusion. Musculoskeletal: No worrisome lytic or sclerotic osseous abnormality. CT ABDOMEN PELVIS FINDINGS Hepatobiliary: 2.6 cm subcapsular lesion in the inferior right liver on 70/2 is stable and has been previously characterized as benign cavernous hemangioma. There is no evidence for gallstones, gallbladder wall thickening, or pericholecystic fluid. No intrahepatic or extrahepatic biliary dilation. Pancreas: No focal mass lesion. No dilatation of the main duct. No intraparenchymal cyst. No peripancreatic edema. Spleen: No splenomegaly. No suspicious focal mass lesion. Adrenals/Urinary Tract: No adrenal nodule or mass. Kidneys unremarkable. No evidence for hydroureter. The urinary bladder appears normal for the degree of distention. Stomach/Bowel: Stomach is unremarkable. No gastric wall thickening. No evidence of outlet obstruction. Duodenum is normally positioned as is the ligament of Treitz. No small bowel wall thickening. No small bowel dilatation. The terminal ileum is normal. The appendix is normal. No gross colonic mass. No  colonic wall thickening. Vascular/Lymphatic: There is moderate atherosclerotic calcification of the abdominal aorta without aneurysm. Bilateral common iliac artery stent device is noted. There is no gastrohepatic or hepatoduodenal ligament lymphadenopathy. No retroperitoneal or mesenteric lymphadenopathy. Small lymph nodes in the gastrohepatic ligament are stable. No pelvic sidewall lymphadenopathy. Reproductive: The prostate gland and seminal vesicles are unremarkable. Other: No intraperitoneal free fluid. Musculoskeletal: Fixation hardware noted proximal left femur No worrisome lytic or sclerotic osseous abnormality. IMPRESSION: 1. Stable exam. No new or progressive findings to suggest recurrent or metastatic disease in the chest, abdomen, or pelvis. 2. Stable scarring right middle lobe. 3. Aortic Atherosclerosis (ICD10-I70.0) and Emphysema (ICD10-J43.9). Electronically Signed   By: Kennith Center M.D.   On: 03/18/2023 09:00    ASSESSMENT AND PLAN: This is a very pleasant 67 years old African-American male recently diagnosed with a stage IV (TX, N3, M1c) non-small cell lung cancer, poorly differentiated adenocarcinoma presented with bulky mediastinal as well as right hilar, supraclavicular, abdominal and right axillary lymphadenopathy in addition to metastatic liver lesions diagnosed in January 2021. Molecular studies by guardant 360 shows no actionable mutations.  The patient is not a candidate for treatment with targeted therapy or enrollment in the clinical trial with the [facility] regimen. The patient is currently undergoing systemic chemotherapy with carboplatin for  AUC of 5, Alimta 500 mg/M2 and Keytruda 200 mg IV every 3 weeks status post 35 cycles. Starting from cycle #5 he is treated with maintenance Alimta and Keytruda every 3 weeks.  Starting from cycle #12 the patient has been on treatment with single agent Keytruda because of the renal insufficiency. The patient completed 2 years of treatment with  immunotherapy. The patient has been in observation and feeling fine. He had repeat CT scan of the chest, abdomen and pelvis performed recently.  I personally and independently reviewed the scan and discussed the result with the patient today.  His scan showed no concerning findings for disease progression.    Stage IV Lung Adenocarcinoma Completed two years of Keytruda treatment a year ago. Currently on observation with no new complaints. Recent scans and lab work are satisfactory. -Continue observation. -Next follow-up in four months with chest, abdomen, and pelvis scan.  Balance Issues Reports balance issues without focal neurological symptoms or dizziness. -Monitor and report any changes or worsening of symptoms.  Mild Anemia Hemoglobin slightly lower than normal. Previously received iron infusion with good response. -Continue monitoring hemoglobin levels. -Consider another iron infusion if symptoms of anemia (such as ice craving) return.  General Health Maintenance -Administer influenza vaccine today. -Check availability of COVID-19 vaccine.    For the anemia, he will continue with the oral iron supplement for now. For the renal insufficiency he is followed by nephrology. The patient will continue to have Port-A-Cath flush every 2 months. The patient was advised to call immediately if he has any other concerning symptoms in the interval. The patient voices understanding of current disease status and treatment options and is in agreement with the current care plan.  All questions were answered. The patient knows to call the clinic with any problems, questions or concerns. We can certainly see the patient much sooner if necessary.  Disclaimer: This note was dictated with voice recognition software. Similar sounding words can inadvertently be transcribed and may not be corrected upon review.

## 2023-03-25 ENCOUNTER — Other Ambulatory Visit: Payer: Self-pay | Admitting: Nurse Practitioner

## 2023-03-25 DIAGNOSIS — K5900 Constipation, unspecified: Secondary | ICD-10-CM

## 2023-04-03 ENCOUNTER — Other Ambulatory Visit: Payer: Self-pay | Admitting: Pharmacist

## 2023-04-03 NOTE — Telephone Encounter (Signed)
Pt will like for you to call him!  [phone]

## 2023-04-03 NOTE — Progress Notes (Signed)
Care Coordination Call  Received call from patient. He notes that copay for Jardiance has increased. Discussed that he is likely in the Medicare Coverage Gap, though Gap is going away for 2025. Patient will look at the supply of Jardiance has and consider filling a 30 day supply and taking every other day (or 1/2 tablet daily).   He will keep an eye on blood sugars and reach out with new elevations. As last eGFR is >45, we could increase metformin to 1500 or 2000 mg daily.   Catie Eppie Gibson, PharmD, BCACP, CPP Clinical Pharmacist [facility] [phone]

## 2023-04-03 NOTE — Progress Notes (Signed)
Error

## 2023-04-08 ENCOUNTER — Other Ambulatory Visit: Payer: Self-pay | Admitting: Nurse Practitioner

## 2023-04-08 DIAGNOSIS — I517 Cardiomegaly: Secondary | ICD-10-CM

## 2023-04-08 DIAGNOSIS — I10 Essential (primary) hypertension: Secondary | ICD-10-CM

## 2023-04-08 DIAGNOSIS — I2510 Atherosclerotic heart disease of native coronary artery without angina pectoris: Secondary | ICD-10-CM

## 2023-04-14 ENCOUNTER — Ambulatory Visit (INDEPENDENT_AMBULATORY_CARE_PROVIDER_SITE_OTHER): Payer: Medicare Other | Admitting: Nurse Practitioner

## 2023-04-14 ENCOUNTER — Encounter: Payer: Self-pay | Admitting: Nurse Practitioner

## 2023-04-14 VITALS — BP 114/54 | HR 72 | Ht 75.0 in | Wt 219.0 lb

## 2023-04-14 DIAGNOSIS — R2681 Unsteadiness on feet: Secondary | ICD-10-CM

## 2023-04-14 DIAGNOSIS — E1151 Type 2 diabetes mellitus with diabetic peripheral angiopathy without gangrene: Secondary | ICD-10-CM | POA: Diagnosis not present

## 2023-04-14 LAB — POCT GLYCOSYLATED HEMOGLOBIN (HGB A1C): Hemoglobin A1C: 7.3 % — AB (ref 4.0–5.6)

## 2023-04-14 MED ORDER — GLIMEPIRIDE 2 MG PO TABS
2.0000 mg | ORAL_TABLET | Freq: Every day | ORAL | 2 refills | Status: DC
Start: 2023-04-14 — End: 2023-12-29

## 2023-04-14 NOTE — Progress Notes (Signed)
Subjective   Patient ID: Kirk [name], male    DOB: 1956/05/19, 67 y.o.   MRN: [id number]  Chief Complaint  Patient presents with   Follow-up    Referring provider: Ivonne Andrew, NP  Kirk [name] is a 67 y.o. male with Past Medical History: No date: Arthritis     Comment:  "right knee" (09/04/2015) 04/19/2019: Atrial flutter with rapid ventricular response (HCC) 10/2019: Dizziness No date: Dyspnea     Comment:  increased exertion 07/2020: Elevated serum creatinine No date: HTN (hypertension) No date: Hyperlipidemia No date: Kidney disease     Comment:  stage 3B 05/2019: Lung cancer (HCC) 07/2020: Microalbuminuria 09/04/2015: NSTEMI (non-ST elevated myocardial infarction) (HCC)     Comment:  a. cath 09/05/2015: 95% stenosis mid-LAD, 55% mid-RCA,               and 40% prox-RCA. PCI performed w/ Synergy DES to LAD No date: PAD (peripheral artery disease) (HCC)     Comment:  Stenting of bilateral iliacs in 2003. No date: Refusal of blood transfusions as patient is Jehovah's Witness No date: Type II diabetes mellitus (HCC) 07/2020: Vitamin D deficiency   HPI  Diagnosed and treated for stage 4 lung cancer. States that he completed six rounds of chemotherapy and oral therapy. Was informed by oncologist that cancer is stable, but not in remission. Completes follow up every 3 months for reevaluation of lung cancer. lung cancer still stable. Also, followed by nephrology.      Diabetes Mellitus: Patient presents for follow up of diabetes. Symptoms: none.  Patient denies foot ulcerations, hypoglycemia , nausea, and polydipsia.  Evaluation to date has been included: hemoglobin A1C.  Home sugars: BG. Treatment to date: no recent interventions. Denies monitoring meals, but walks his dog daily. Hgb A1c 7.3. Is compliant with medications.  Although he is currently having issues getting Jardiance filled due to insurance.  Patient does follow with nephrology.     Denies any other  concerns today. Denies any fatigue, chest pain, shortness of breath, HA or dizziness. Denies any blurred vision, numbness or tingling.       Allergies  Allergen Reactions   Seasonal Ic [Octacosanol]     Immunization History  Administered Date(s) Administered   Fluad Quad(high Dose 65+) 03/21/2020, 03/15/2021, 03/08/2022   Fluad Trivalent(High Dose 65+) 03/18/2023   Influenza, High Dose Seasonal PF 03/22/2019   PFIZER(Purple Top)SARS-COV-2 Vaccination 01/18/2020, 07/05/2020, 08/02/2020, 09/27/2020   Pneumococcal Conjugate-13 03/22/2019    Tobacco History: Social History   Tobacco Use  Smoking Status Former   Current packs/day: 0.00   Average packs/day: 2.0 packs/day for 35.0 years (70.0 ttl pk-yrs)   Types: Cigarettes   Start date: 02/19/1968   Quit date: 02/19/2003   Years since quitting: 20.1  Smokeless Tobacco Never   Counseling given: Not Answered   Outpatient Encounter Medications as of 04/14/2023  Medication Sig   acetaminophen (TYLENOL) 500 MG tablet Take 500 mg by mouth every 6 (six) hours as needed for moderate pain or fever.    aspirin EC 81 MG tablet Take 81 mg by mouth at bedtime.   benzonatate (TESSALON) 200 MG capsule Take 1 capsule (200 mg total) by mouth 2 (two) times daily as needed for cough.   Blood Glucose Monitoring Suppl (ACCU-CHEK GUIDE) w/Device KIT USE UP TO FOUR TIMES DAILY AS DIRECTED. (FOR ICD-10 E10.9, E11.9).   carvedilol (COREG) 12.5 MG tablet TAKE 1 TABLET (12.5MG  TOTAL) BY MOUTH TWICE A DAY WITH MEALS  clopidogrel (PLAVIX) 75 MG tablet TAKE 1 TABLET BY MOUTH EVERY DAY   docusate sodium (COLACE) 100 MG capsule TAKE 1 CAPSULE (100 MG TOTAL) BY MOUTH 2 (TWO) TIMES DAILY AS NEEDED FOR MILD CONSTIPATION.   ferrous sulfate 324 MG TBEC Take 1 tablet (324 mg total) by mouth daily with breakfast.   gabapentin (NEURONTIN) 300 MG capsule TAKE 1 CAPSULE BY MOUTH THREE TIMES A DAY   Glucose Blood (BLOOD GLUCOSE TEST STRIPS) STRP 1 each by In Vitro route  3 (three) times daily with meals. May substitute to any manufacturer covered by patient's insurance.   JARDIANCE 25 MG TABS tablet TAKE 1 TABLET BY MOUTH DAILY BEFORE BREAKFAST.   metFORMIN (GLUCOPHAGE-XR) 500 MG 24 hr tablet Take 1 tablet (500 mg total) by mouth 2 (two) times daily with a meal.   Misc. Devices (ROLLATOR ULTRA-LIGHT) MISC 1 each by Does not apply route daily.   nitroGLYCERIN (NITROSTAT) 0.4 MG SL tablet Place 1 tablet (0.4 mg total) under the tongue every 5 (five) minutes x 3 doses as needed for chest pain.   promethazine-dextromethorphan (PROMETHAZINE-DM) 6.25-15 MG/5ML syrup Take 5 mLs by mouth 4 (four) times daily as needed.   rosuvastatin (CRESTOR) 40 MG tablet TAKE 1 TABLET BY MOUTH EVERY DAY   traZODone (DESYREL) 50 MG tablet TAKE 0.5-1 TABLETS BY MOUTH AT BEDTIME AS NEEDED FOR SLEEP.   [DISCONTINUED] glimepiride (AMARYL) 2 MG tablet TAKE 1 TABLET BY MOUTH EVERY DAY BEFORE BREAKFAST   glimepiride (AMARYL) 2 MG tablet Take 1 tablet (2 mg total) by mouth daily with breakfast.   No facility-administered encounter medications on file as of 04/14/2023.    Review of Systems  Review of Systems  Constitutional: Negative.   HENT: Negative.    Cardiovascular: Negative.   Gastrointestinal: Negative.   Allergic/Immunologic: Negative.   Neurological: Negative.   Psychiatric/Behavioral: Negative.       Objective:   BP (!) 114/54 (BP Location: Left Arm, Patient Position: Sitting, Cuff Size: Large)   Pulse 72   Ht 6\' 3"  (1.905 m)   Wt 219 lb (99.3 kg)   SpO2 97%   BMI 27.37 kg/m   Wt Readings from Last 5 Encounters:  04/14/23 219 lb (99.3 kg)  03/18/23 219 lb 9 oz (99.6 kg)  01/10/23 221 lb 12.8 oz (100.6 kg)  11/14/22 220 lb 4.8 oz (99.9 kg)  10/10/22 223 lb 9.6 oz (101.4 kg)     Physical Exam Vitals and nursing note reviewed.  Constitutional:      General: He is not in acute distress.    Appearance: He is well-developed.  Cardiovascular:     Rate and  Rhythm: Normal rate and regular rhythm.  Pulmonary:     Effort: Pulmonary effort is normal.     Breath sounds: Normal breath sounds.  Skin:    General: Skin is warm and dry.  Neurological:     Mental Status: He is alert and oriented to person, place, and time.       Assessment & Plan:   Diabetes mellitus with peripheral vascular disease (HCC) -     POCT glycosylated hemoglobin (Hb A1C) -     Glimepiride; Take 1 tablet (2 mg total) by mouth daily with breakfast.  Dispense: 90 tablet; Refill: 2  Unsteady gait -     Ambulatory referral to Physical Therapy     Return in about 3 months (around 07/15/2023).     Ivonne Andrew, NP 04/14/2023

## 2023-04-14 NOTE — Patient Instructions (Addendum)
1. Diabetes mellitus with peripheral vascular disease (HCC)  - POCT glycosylated hemoglobin (Hb A1C) - glimepiride (AMARYL) 2 MG tablet; Take 1 tablet (2 mg total) by mouth daily with breakfast.  Dispense: 90 tablet; Refill: 2   Follow up:  Follow up in 3 months

## 2023-04-17 ENCOUNTER — Encounter: Payer: Self-pay | Admitting: Internal Medicine

## 2023-04-17 NOTE — Telephone Encounter (Signed)
Telephone call  

## 2023-04-18 ENCOUNTER — Encounter: Payer: Self-pay | Admitting: Internal Medicine

## 2023-04-28 ENCOUNTER — Encounter: Payer: Self-pay | Admitting: Physical Therapy

## 2023-04-28 ENCOUNTER — Other Ambulatory Visit: Payer: Self-pay

## 2023-04-28 ENCOUNTER — Ambulatory Visit: Payer: Medicare Other | Attending: Nurse Practitioner | Admitting: Physical Therapy

## 2023-04-28 DIAGNOSIS — M6281 Muscle weakness (generalized): Secondary | ICD-10-CM | POA: Insufficient documentation

## 2023-04-28 DIAGNOSIS — R29898 Other symptoms and signs involving the musculoskeletal system: Secondary | ICD-10-CM | POA: Insufficient documentation

## 2023-04-28 DIAGNOSIS — R262 Difficulty in walking, not elsewhere classified: Secondary | ICD-10-CM | POA: Insufficient documentation

## 2023-04-28 DIAGNOSIS — R208 Other disturbances of skin sensation: Secondary | ICD-10-CM | POA: Diagnosis present

## 2023-04-28 DIAGNOSIS — R2681 Unsteadiness on feet: Secondary | ICD-10-CM | POA: Insufficient documentation

## 2023-04-28 NOTE — Therapy (Signed)
OUTPATIENT PHYSICAL THERAPY NEURO EVALUATION   Patient Name: Kirk [name] MRN: [id number] DOB:May 14, 1956, 67 y.o., male Today's Date: 04/28/2023   PCP: Ivonne Andrew, NP REFERRING PROVIDER: Ivonne Andrew, NP  END OF SESSION:  PT End of Session - 04/28/23 0948     Visit Number 1    Number of Visits 13    Date for PT Re-Evaluation 06/09/23    Authorization Type M[employer] and [employer]    Authorization Time Period 04/28/23 to 06/23/23    Progress Note Due on Visit 10    PT Start Time 0844    PT Stop Time 0926    PT Time Calculation (min) 42 min    Activity Tolerance Patient tolerated treatment well    Behavior During Therapy [facility] for tasks assessed/performed             Past Medical History:  Diagnosis Date   Arthritis    "right knee" (09/04/2015)   Atrial flutter with rapid ventricular response (HCC) 04/19/2019   Dizziness 10/2019   Dyspnea    increased exertion   Elevated serum creatinine 07/2020   HTN (hypertension)    Hyperlipidemia    Kidney disease    stage 3B   Lung cancer (HCC) 05/2019   Microalbuminuria 07/2020   NSTEMI (non-ST elevated myocardial infarction) (HCC) 09/04/2015   a. cath 09/05/2015: 95% stenosis mid-LAD, 55% mid-RCA, and 40% prox-RCA. PCI performed w/ Synergy DES to LAD   PAD (peripheral artery disease) (HCC)    Stenting of bilateral iliacs in 2003.   Refusal of blood transfusions as patient is Jehovah's Witness    Type II diabetes mellitus (HCC)    Vitamin D deficiency 07/2020   Past Surgical History:  Procedure Laterality Date   CARDIAC CATHETERIZATION N/A 09/05/2015   Procedure: Left Heart Cath and Coronary Angiography;  Surgeon: Lennette Bihari, MD;  Location: [facility] INVASIVE CV LAB;  Service: Cardiovascular;  Laterality: N/A;   CARDIAC CATHETERIZATION N/A 09/05/2015   Procedure: Coronary Stent Intervention;  Surgeon: Lennette Bihari, MD;  Location: [facility] INVASIVE CV LAB;  Service: Cardiovascular;  Laterality: N/A;   IR IMAGING GUIDED  PORT INSERTION  07/15/2019   ORIF CONGENITAL HIP DISLOCATION Left ~ 1965   "put pins in it"   PERIPHERAL VASCULAR CATHETERIZATION Bilateral 2003   "stenting"   TONSILLECTOMY  ~ 1963   Patient Active Problem List   Diagnosis Date Noted   Upper respiratory tract infection 07/11/2022   Iron deficiency anemia 03/28/2022   Need for immunization against influenza 03/08/2022   Chemotherapy-induced neuropathy (HCC) 05/31/2021   Drug-induced neutropenia (HCC) 12/26/2020   Port-A-Cath in place 12/29/2019   Metastatic non-small cell lung cancer (HCC) 07/07/2019   Encounter for antineoplastic chemotherapy 07/07/2019   Encounter for antineoplastic immunotherapy 07/07/2019   Goals of care, counseling/discussion 06/28/2019   Adenocarcinoma of right lung, stage 4 (HCC) 06/28/2019   Cancer associated pain 06/28/2019   CAD (coronary artery disease) 06/08/2019   Mediastinal lymphadenopathy 06/03/2019   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) 06/03/2019   Hemoglobin A1C between 7% and 9% indicating borderline diabetic control (HCC) 05/10/2019   COVID-19 virus infection 05/10/2019   Atrial flutter with rapid ventricular response (HCC) 04/19/2019   Chest pain with moderate risk for cardiac etiology 04/19/2019   Paroxysmal atrial flutter (HCC) 04/19/2019   Chest congestion 07/10/2018   Cough 07/10/2018   NSTEMI (non-ST elevated myocardial infarction) (HCC) 09/04/2015   PVD (peripheral vascular disease) (HCC) 06/08/2015   DDD (degenerative  disc disease), cervical 06/08/2015   Hyperlipidemia 06/08/2015   Diabetes mellitus with nephropathy (HCC) 06/08/2015   Diabetes mellitus with peripheral vascular disease (HCC) 06/08/2015   Annual physical exam 01/03/2015   PAD (peripheral artery disease) (HCC) 02/18/2013   Diabetes (HCC) 02/18/2013   HTN (hypertension) 02/18/2013    ONSET DATE: "a couple years ago"   REFERRING DIAG: Diagnosis R26.81 (ICD-10-CM) - Unsteady gait  THERAPY DIAG:   Unsteadiness on feet  Difficulty in walking, not elsewhere classified  Other disturbances of skin sensation  Muscle weakness (generalized)  Other symptoms and signs involving the musculoskeletal system  Rationale for Evaluation and Treatment: Rehabilitation  SUBJECTIVE:                                                                                                                                                                                             SUBJECTIVE STATEMENT:  Feeling unsteady when I walk, not sure where its coming from. Sometimes I can just be standing and I just start leaning over. No falls lately. Its been getting a little worse over time. It started about when I started taking chemo, not on active chemo anymore. Have been having some dizziness since starting chemo, ENT ruled out vertigo and told me there's not much that can be done, just needed to get used to it.   Pt accompanied by: self  PERTINENT HISTORY: see above   PAIN:  Are you having pain? No  PRECAUTIONS: Fall  RED FLAGS: None   WEIGHT BEARING RESTRICTIONS: No  FALLS: Has patient fallen in last 6 months? No; no FOF  LIVING ENVIRONMENT: Lives with: lives with their spouse Lives in: House/apartment Stairs:  "couple of steps going into the home, no rails but not a big deal"  Has following equipment at home: None  PLOF: Independent, Independent with basic ADLs, Independent with gait, and Independent with transfers  PATIENT GOALS: not sure, was sent here by MD   OBJECTIVE:  Note: Objective measures were completed at Evaluation unless otherwise noted.  DIAGNOSTIC FINDINGS:   COGNITION: Overall cognitive status: Within functional limits for tasks assessed   SENSATION: Hx of neuropathy after chemo        LOWER EXTREMITY MMT:    MMT Right Eval Left Eval  Hip flexion 3+ 3+  Hip extension 3 3  Hip abduction 3+ 3+  Hip adduction    Hip internal rotation    Hip external  rotation    Knee flexion 4+ 4-  Knee extension 5 4+  Ankle dorsiflexion 5 4+  Ankle plantarflexion    Ankle inversion    Ankle eversion    (  Blank rows = not tested)      STAIRS: Level of Assistance: Complete Independence Stair Negotiation Technique: Step to Pattern with No Rails Number of Stairs: 4  Height of Stairs: 6 inch   Comments: moderate unsteadiness with descent   GAIT: Gait pattern: step through pattern, decreased arm swing- Right, decreased arm swing- Left, decreased ankle dorsiflexion- Right, decreased ankle dorsiflexion- Left, decreased trunk rotation, and trunk flexed Distance walked: 546ft  Assistive device utilized: None Level of assistance: Complete Independence Comments: increased hip fatigue, some hip pain with gait, + trendelenburg, R toe out with gait   FUNCTIONAL TESTS:  3 minute walk test: 571ft  Dynamic Gait Index: 17/24 VOR reflex intact, VOR cancellation OK  PATIENT SURVEYS:  Not in FOTO system at eval   TODAY'S TREATMENT:                                                                                                                              DATE:    04/28/23- Eval, care planning, HEP assignment and practice  Seated figure 4 piriformis stretch 2x30 seconds B Discussed supine figure 4 stretch (alternative to above) Standing hip ABD red TB above knees x10 B  Bridges red TB above knees x10 Tandem stance at counter 1x30 seconds B    PATIENT EDUCATION: Education details: exam findings, POC, HEP; education on role of hip strengthening and flexibility in addressing hip pain, balance issues, and gait unsteadiness; also education on relationship between chemo and neuropathy  Person educated: Patient Education method: Explanation, Demonstration, and Handouts Education comprehension: verbalized understanding, returned demonstration, and needs further education  HOME EXERCISE PROGRAM: Access Code: [email or web address] URL:  [email or web address] Date: 04/28/2023 Prepared by: Nedra Hai  Exercises - Supine Figure 4 Piriformis Stretch  - 1 x daily - 7 x weekly - 1 sets - 4 reps - 30 seconds  hold - Seated Figure 4 Piriformis Stretch  - 1 x daily - 7 x weekly - 1 sets - 4 reps - 30 seconds  hold - Standing Hip Abduction with Resistance at Thighs  - 1 x daily - 7 x weekly - 2 sets - 10 reps - 1 second hold - Supine Bridge with Resistance Band  - 1 x daily - 7 x weekly - 2 sets - 10 reps - 2 seconds  hold - Tandem Stance in Corner  - 1 x daily - 7 x weekly - 1 sets - 4 reps - 30 seconds  hold  GOALS: Goals reviewed with patient? No  SHORT TERM GOALS: Target date: 05/19/2023    Will be compliant with appropriate progressive HEP  Baseline: Goal status: INITIAL  2.  Piriformis muscle flexibility to have improved as evidenced by resolution of toe out R LE with gait  Baseline:  Goal status: INITIAL  3.  Hip pain to have resolved  Baseline:  Goal status: INITIAL  4. Will be able to verbalize importance of regular foot/skin checks given hx  of DM and chemo induced neuropathy   LONG TERM GOALS: Target date: 06/09/2023    MMT to improve by at least 1 grade in all weak groups Baseline:  Goal status: INITIAL  2.  Will score at least 21/24 on DGI to show improved functional balance  Baseline:  Goal status: INITIAL  3.  Will be able to ascend/descend steps without significant unsteadiness especially with stair descent  Baseline:  Goal status: INITIAL  4.  Will be able to walk unlimited distances in the community without hip pain or unsteadiness in order to increase activity levels  Baseline:  Goal status: INITIAL    ASSESSMENT:  CLINICAL IMPRESSION: Patient is a 68 y.o. M who was seen today for physical therapy evaluation and treatment for Diagnosis R26.81 (ICD-10-CM) - Unsteady gait. Objective measures as above, suspect that he may have possible R hip trochanteric bursitis starting  given significant mm weakness/hip stiffness and pain patterns- this can be easily addressed along with balance impairments in his POC. Does have some dizziness of unclear origin, started when he was on active chemo a couple of years ago and vertigo was ruled out by ENT per his report- will continue to monitor.   OBJECTIVE IMPAIRMENTS: Abnormal gait, decreased activity tolerance, decreased balance, decreased mobility, difficulty walking, decreased strength, impaired flexibility, impaired sensation, and pain.   ACTIVITY LIMITATIONS: transfers and locomotion level  PARTICIPATION LIMITATIONS: driving, shopping, community activity, yard work, and church  PERSONAL FACTORS: Fitness, Past/current experiences, and Time since onset of injury/illness/exacerbation are also affecting patient's functional outcome.   REHAB POTENTIAL: Excellent  CLINICAL DECISION MAKING: Evolving/moderate complexity  EVALUATION COMPLEXITY: Moderate  PLAN:  PT FREQUENCY: 2x/week  PT DURATION: 6 weeks  PLANNED INTERVENTIONS: 97164- PT Re-evaluation, 97110-Therapeutic exercises, 97530- Therapeutic activity, 97112- Neuromuscular re-education, 97535- Self Care, [id number]- Manual therapy, [id number]- Gait training, [phone]- Aquatic Therapy, [phone]- Ionotophoresis 4mg /ml Dexamethasone, Patient/Family education, Balance training, Stair training, Taping, Dry Needling, and Vestibular training  PLAN FOR NEXT SESSION: proximal strength and flexibility, balance training, education on managing chronic neuropathy. Monitor dizziness and w/u as appropriate   Nedra Hai, PT, DPT 04/28/23 9:49 AM

## 2023-05-05 ENCOUNTER — Encounter: Payer: Self-pay | Admitting: Physical Therapy

## 2023-05-05 ENCOUNTER — Ambulatory Visit: Payer: Medicare Other | Admitting: Physical Therapy

## 2023-05-05 VITALS — BP 113/64 | HR 73

## 2023-05-05 DIAGNOSIS — R2681 Unsteadiness on feet: Secondary | ICD-10-CM

## 2023-05-05 DIAGNOSIS — M6281 Muscle weakness (generalized): Secondary | ICD-10-CM

## 2023-05-05 DIAGNOSIS — R262 Difficulty in walking, not elsewhere classified: Secondary | ICD-10-CM

## 2023-05-05 NOTE — Therapy (Signed)
OUTPATIENT PHYSICAL THERAPY NEURO TREATMENT   Patient Name: Kirk [name] MRN: [id number] DOB:03-15-1956, 67 y.o., male Today's Date: 05/05/2023   PCP: Ivonne Andrew, NP REFERRING PROVIDER: Ivonne Andrew, NP  END OF SESSION:  PT End of Session - 05/05/23 0852     Visit Number 2    Number of Visits 13    Date for PT Re-Evaluation 06/09/23    Authorization Type M[employer] and [employer]    Authorization Time Period 04/28/23 to 06/23/23    Progress Note Due on Visit 10    PT Start Time 0850    PT Stop Time 0931    PT Time Calculation (min) 41 min    Equipment Utilized During Treatment Gait belt    Activity Tolerance Patient tolerated treatment well    Behavior During Therapy WFL for tasks assessed/performed             Past Medical History:  Diagnosis Date   Arthritis    "right knee" (09/04/2015)   Atrial flutter with rapid ventricular response (HCC) 04/19/2019   Dizziness 10/2019   Dyspnea    increased exertion   Elevated serum creatinine 07/2020   HTN (hypertension)    Hyperlipidemia    Kidney disease    stage 3B   Lung cancer (HCC) 05/2019   Microalbuminuria 07/2020   NSTEMI (non-ST elevated myocardial infarction) (HCC) 09/04/2015   a. cath 09/05/2015: 95% stenosis mid-LAD, 55% mid-RCA, and 40% prox-RCA. PCI performed w/ Synergy DES to LAD   PAD (peripheral artery disease) (HCC)    Stenting of bilateral iliacs in 2003.   Refusal of blood transfusions as patient is Jehovah's Witness    Type II diabetes mellitus (HCC)    Vitamin D deficiency 07/2020   Past Surgical History:  Procedure Laterality Date   CARDIAC CATHETERIZATION N/A 09/05/2015   Procedure: Left Heart Cath and Coronary Angiography;  Surgeon: Lennette Bihari, MD;  Location: [facility];  Service: Cardiovascular;  Laterality: N/A;   CARDIAC CATHETERIZATION N/A 09/05/2015   Procedure: Coronary Stent Intervention;  Surgeon: Lennette Bihari, MD;  Location: [facility];  Service:  Cardiovascular;  Laterality: N/A;   IR IMAGING GUIDED PORT INSERTION  07/15/2019   ORIF CONGENITAL HIP DISLOCATION Left ~ 1965   "put pins in it"   PERIPHERAL VASCULAR CATHETERIZATION Bilateral 2003   "stenting"   TONSILLECTOMY  ~ 1963   Patient Active Problem List   Diagnosis Date Noted   Upper respiratory tract infection 07/11/2022   Iron deficiency anemia 03/28/2022   Need for immunization against influenza 03/08/2022   Chemotherapy-induced neuropathy (HCC) 05/31/2021   Drug-induced neutropenia (HCC) 12/26/2020   Port-A-Cath in place 12/29/2019   Metastatic non-small cell lung cancer (HCC) 07/07/2019   Encounter for antineoplastic chemotherapy 07/07/2019   Encounter for antineoplastic immunotherapy 07/07/2019   Goals of care, counseling/discussion 06/28/2019   Adenocarcinoma of right lung, stage 4 (HCC) 06/28/2019   Cancer associated pain 06/28/2019   CAD (coronary artery disease) 06/08/2019   Mediastinal lymphadenopathy 06/03/2019   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) 06/03/2019   Hemoglobin A1C between 7% and 9% indicating borderline diabetic control (HCC) 05/10/2019   COVID-19 virus infection 05/10/2019   Atrial flutter with rapid ventricular response (HCC) 04/19/2019   Chest pain with moderate risk for cardiac etiology 04/19/2019   Paroxysmal atrial flutter (HCC) 04/19/2019   Chest congestion 07/10/2018   Cough 07/10/2018   NSTEMI (non-ST elevated myocardial infarction) (HCC) 09/04/2015   PVD (  peripheral vascular disease) (HCC) 06/08/2015   DDD (degenerative disc disease), cervical 06/08/2015   Hyperlipidemia 06/08/2015   Diabetes mellitus with nephropathy (HCC) 06/08/2015   Diabetes mellitus with peripheral vascular disease (HCC) 06/08/2015   Annual physical exam 01/03/2015   PAD (peripheral artery disease) (HCC) 02/18/2013   Diabetes (HCC) 02/18/2013   HTN (hypertension) 02/18/2013    ONSET DATE: "a couple years ago"   REFERRING DIAG: Diagnosis R26.81  (ICD-10-CM) - Unsteady gait  THERAPY DIAG:  Unsteadiness on feet  Muscle weakness (generalized)  Difficulty in walking, not elsewhere classified  Rationale for Evaluation and Treatment: Rehabilitation  SUBJECTIVE:                                                                                                                                                                                             SUBJECTIVE STATEMENT:  Will be standing and then find yourself leaning to the L. Reports he saw an ENT 3 or 4 years ago. Since then has had chemo, has not had it in 3 years and everything is stable right now. Has chronic neuropathy. Wants to work more on his balance and the feeling of being unsteady. Describes dizziness as more of an unsteady. Feels pretty unsteady when walking.   Pt accompanied by: self  PERTINENT HISTORY: see above   PAIN:  Are you having pain? Yes: NPRS scale: 4-5/10 Pain location: Both hips Pain description: Stiffness Aggravating factors: Sitting  Relieving factors: Start moving   Vitals:   05/05/23 0900  BP: 113/64  Pulse: 73     PRECAUTIONS: Fall  RED FLAGS: None   WEIGHT BEARING RESTRICTIONS: No  FALLS: Has patient fallen in last 6 months? No; no FOF  LIVING ENVIRONMENT: Lives with: lives with their spouse Lives in: House/apartment Stairs:  "couple of steps going into the home, no rails but not a big deal"  Has following equipment at home: None  PLOF: Independent, Independent with basic ADLs, Independent with gait, and Independent with transfers  PATIENT GOALS: not sure, was sent here by MD   On 12/9, pt states he wants his goal to be more stable   OBJECTIVE:  Note: Objective measures were completed at Evaluation unless otherwise noted.  DIAGNOSTIC FINDINGS:   COGNITION: Overall cognitive status: Within functional limits for tasks assessed   SENSATION: Hx of neuropathy after chemo     LOWER EXTREMITY MMT:    MMT Right Eval  Left Eval  Hip flexion 3+ 3+  Hip extension 3 3  Hip abduction 3+ 3+  Hip adduction    Hip internal rotation    Hip external rotation  Knee flexion 4+ 4-  Knee extension 5 4+  Ankle dorsiflexion 5 4+  Ankle plantarflexion    Ankle inversion    Ankle eversion    (Blank rows = not tested)    STAIRS: Level of Assistance: Complete Independence Stair Negotiation Technique: Step to Pattern with No Rails Number of Stairs: 4  Height of Stairs: 6 inch   Comments: moderate unsteadiness with descent   GAIT: Gait pattern: step through pattern, decreased arm swing- Right, decreased arm swing- Left, decreased ankle dorsiflexion- Right, decreased ankle dorsiflexion- Left, decreased trunk rotation, and trunk flexed Distance walked: 574ft  Assistive device utilized: None Level of assistance: Complete Independence Comments: increased hip fatigue, some hip pain with gait, + trendelenburg, R toe out with gait   FUNCTIONAL TESTS:  3 minute walk test: 581ft  Dynamic Gait Index: 17/24 VOR reflex intact, VOR cancellation OK   PATIENT SURVEYS:  Not in FOTO system at eval   TODAY'S TREATMENT:                                                                                                                              DATE: 05/05/23  Therapeutic Activity:   Vitals:   05/05/23 0900  BP: 113/64  Pulse: 73   Assessed BP and WNL for therapy    M-CTSIB  Condition 1: Firm Surface, EO 30 Sec, Normal Sway  Condition 2: Firm Surface, EC 30 Sec, Mild Sway  Condition 3: Foam Surface, EO 20 Sec, Normal Sway  Condition 4: Foam Surface, EC 3 Sec     NMR:   On air ex With feet apart EO: 2 x 10 reps head turns, 2 x 10 reps head nods, pt initially more unsteady with head turns  With feet hip width EC 3 x 20-30 seconds, intermittently losing balance forwards against chair  At bottom of staircase, alternating SLS taps to 6" step 10 reps each leg, then to 2nd step 10 reps each side, CGA as needed for  balance  At countertop: Tandem gait down and back x3 reps, pt needing frequent UE support  Marching down and back x4 reps, pt needing to use fingertip support for balance, difficulty with SLS  On blue foam beam: side stepping down and back x4 reps  10 reps sit <> stands from chair without UE support, 8/10 RPE afterwards   PATIENT EDUCATION: Education details: Results of mCTSIB and purpose of 3 systems for balance and areas to work on in therapy, corner balance additions to HEP  Person educated: Patient Education method: Programmer, multimedia, Facilities manager, and Handouts Education comprehension: verbalized understanding, returned demonstration, and needs further education  HOME EXERCISE PROGRAM: Access Code: [email or web address] URL: [email or web address] Date: 05/05/2023 Prepared by: Sherlie Ban  Exercises - Supine Figure 4 Piriformis Stretch  - 1 x daily - 7 x weekly - 1 sets - 4 reps - 30 seconds  hold - Seated Figure 4 Piriformis Stretch  - 1 x daily - 7 x  weekly - 1 sets - 4 reps - 30 seconds  hold - Standing Hip Abduction with Resistance at Thighs  - 1 x daily - 7 x weekly - 2 sets - 10 reps - 1 second hold - Supine Bridge with Resistance Band  - 1 x daily - 7 x weekly - 2 sets - 10 reps - 2 seconds  hold - Tandem Stance in Corner  - 1 x daily - 7 x weekly - 1 sets - 4 reps - 30 seconds  hold - Standing Balance with Eyes Closed on Foam  - 1 x daily - 5 x weekly - 3 sets - 20-30 hold - Standing with Head Rotation  - 1 x daily - 5 x weekly - 2 sets - 10 reps and nods, on unlevel surface   GOALS: Goals reviewed with patient? No  SHORT TERM GOALS: Target date: 05/19/2023    Will be compliant with appropriate progressive HEP  Baseline: Goal status: INITIAL  2.  Piriformis muscle flexibility to have improved as evidenced by resolution of toe out R LE with gait  Baseline:  Goal status: INITIAL  3.  Hip pain to have resolved  Baseline:  Goal status: INITIAL  4. Will be able to  verbalize importance of regular foot/skin checks given hx of DM and chemo induced neuropathy Baseline:  Goal status: INITIAL  5. Pt will improve condition 3 of mCTSIB to at least 30 seconds in order to demo improved balance on compliant surfaces.  Baseline: 20 seconds  Goal status: INITIAL    LONG TERM GOALS: Target date: 06/09/2023    MMT to improve by at least 1 grade in all weak groups Baseline:  Goal status: INITIAL  2.  Will score at least 21/24 on DGI to show improved functional balance  Baseline:  Goal status: INITIAL  3.  Will be able to ascend/descend steps without significant unsteadiness especially with stair descent  Baseline:  Goal status: INITIAL  4.  Will be able to walk unlimited distances in the community without hip pain or unsteadiness in order to increase activity levels  Baseline:  Goal status: INITIAL  5. Pt will improve condition 4 of mCTSIB to at least 15 seconds in order to demo improved vestibular system for balance.  Baseline: 3 seconds  Goal status: INITIAL   ASSESSMENT:  CLINICAL IMPRESSION: Today's skilled session focused on further assessment of balance with mCTSIB. Pt only able to hold condition 3 for 20 second and condition 4 for 3 seconds, indicating significantly decr vestibular input for balance and difficulty on unlevel surfaces. Added corner balance to pt's HEP to address EC and unlevel surfaces. The remainder of session focused on balance tasks with narrow BOS, SLS, and on compliant surfaces. With sit <> stands with no UE support, pt rating RPE as an 8/10. Will continue per POC.   OBJECTIVE IMPAIRMENTS: Abnormal gait, decreased activity tolerance, decreased balance, decreased mobility, difficulty walking, decreased strength, impaired flexibility, impaired sensation, and pain.   ACTIVITY LIMITATIONS: transfers and locomotion level  PARTICIPATION LIMITATIONS: driving, shopping, community activity, yard work, and church  PERSONAL  FACTORS: Fitness, Past/current experiences, and Time since onset of injury/illness/exacerbation are also affecting patient's functional outcome.   REHAB POTENTIAL: Excellent  CLINICAL DECISION MAKING: Evolving/moderate complexity  EVALUATION COMPLEXITY: Moderate  PLAN:  PT FREQUENCY: 2x/week  PT DURATION: 6 weeks  PLANNED INTERVENTIONS: 97164- PT Re-evaluation, 97110-Therapeutic exercises, 97530- Therapeutic activity, [id number]- Neuromuscular re-education, 97535- Self Care, 53664- Manual therapy, [id number]- Gait training, [phone]-  Aquatic Therapy, [phone]- Ionotophoresis 4mg /ml Dexamethasone, Patient/Family education, Balance training, Stair training, Taping, Dry Needling, and Vestibular training  PLAN FOR NEXT SESSION: BLE and hip strengthening, balance strategies - SLS, compliant surfaces, EC, work on sit <> stands with no UE support   Sherlie Ban, PT, DPT 05/05/23 10:11 AM

## 2023-05-07 ENCOUNTER — Encounter: Payer: Self-pay | Admitting: Physical Therapy

## 2023-05-07 ENCOUNTER — Ambulatory Visit: Payer: Medicare Other | Admitting: Physical Therapy

## 2023-05-07 DIAGNOSIS — M6281 Muscle weakness (generalized): Secondary | ICD-10-CM

## 2023-05-07 DIAGNOSIS — R262 Difficulty in walking, not elsewhere classified: Secondary | ICD-10-CM

## 2023-05-07 DIAGNOSIS — R2681 Unsteadiness on feet: Secondary | ICD-10-CM | POA: Diagnosis not present

## 2023-05-07 NOTE — Therapy (Signed)
OUTPATIENT PHYSICAL THERAPY NEURO TREATMENT   Patient Name: Kirk [name] MRN: [id number] DOB:December 01, 1955, 67 y.o., male Today's Date: 05/07/2023   PCP: Ivonne Andrew, NP REFERRING PROVIDER: Ivonne Andrew, NP  END OF SESSION:  PT End of Session - 05/07/23 0848     Visit Number 3    Number of Visits 13    Date for PT Re-Evaluation 06/09/23    Authorization Type M[employer] and [employer]    Authorization Time Period 04/28/23 to 06/23/23    Progress Note Due on Visit 10    PT Start Time 0847    PT Stop Time 0927    PT Time Calculation (min) 40 min    Equipment Utilized During Treatment Gait belt    Activity Tolerance Patient tolerated treatment well    Behavior During Therapy WFL for tasks assessed/performed             Past Medical History:  Diagnosis Date   Arthritis    "right knee" (09/04/2015)   Atrial flutter with rapid ventricular response (HCC) 04/19/2019   Dizziness 10/2019   Dyspnea    increased exertion   Elevated serum creatinine 07/2020   HTN (hypertension)    Hyperlipidemia    Kidney disease    stage 3B   Lung cancer (HCC) 05/2019   Microalbuminuria 07/2020   NSTEMI (non-ST elevated myocardial infarction) (HCC) 09/04/2015   a. cath 09/05/2015: 95% stenosis mid-LAD, 55% mid-RCA, and 40% prox-RCA. PCI performed w/ Synergy DES to LAD   PAD (peripheral artery disease) (HCC)    Stenting of bilateral iliacs in 2003.   Refusal of blood transfusions as patient is Jehovah's Witness    Type II diabetes mellitus (HCC)    Vitamin D deficiency 07/2020   Past Surgical History:  Procedure Laterality Date   CARDIAC CATHETERIZATION N/A 09/05/2015   Procedure: Left Heart Cath and Coronary Angiography;  Surgeon: Lennette Bihari, MD;  Location: [facility];  Service: Cardiovascular;  Laterality: N/A;   CARDIAC CATHETERIZATION N/A 09/05/2015   Procedure: Coronary Stent Intervention;  Surgeon: Lennette Bihari, MD;  Location: [facility];  Service:  Cardiovascular;  Laterality: N/A;   IR IMAGING GUIDED PORT INSERTION  07/15/2019   ORIF CONGENITAL HIP DISLOCATION Left ~ 1965   "put pins in it"   PERIPHERAL VASCULAR CATHETERIZATION Bilateral 2003   "stenting"   TONSILLECTOMY  ~ 1963   Patient Active Problem List   Diagnosis Date Noted   Upper respiratory tract infection 07/11/2022   Iron deficiency anemia 03/28/2022   Need for immunization against influenza 03/08/2022   Chemotherapy-induced neuropathy (HCC) 05/31/2021   Drug-induced neutropenia (HCC) 12/26/2020   Port-A-Cath in place 12/29/2019   Metastatic non-small cell lung cancer (HCC) 07/07/2019   Encounter for antineoplastic chemotherapy 07/07/2019   Encounter for antineoplastic immunotherapy 07/07/2019   Goals of care, counseling/discussion 06/28/2019   Adenocarcinoma of right lung, stage 4 (HCC) 06/28/2019   Cancer associated pain 06/28/2019   CAD (coronary artery disease) 06/08/2019   Mediastinal lymphadenopathy 06/03/2019   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) 06/03/2019   Hemoglobin A1C between 7% and 9% indicating borderline diabetic control (HCC) 05/10/2019   COVID-19 virus infection 05/10/2019   Atrial flutter with rapid ventricular response (HCC) 04/19/2019   Chest pain with moderate risk for cardiac etiology 04/19/2019   Paroxysmal atrial flutter (HCC) 04/19/2019   Chest congestion 07/10/2018   Cough 07/10/2018   NSTEMI (non-ST elevated myocardial infarction) (HCC) 09/04/2015   PVD (  peripheral vascular disease) (HCC) 06/08/2015   DDD (degenerative disc disease), cervical 06/08/2015   Hyperlipidemia 06/08/2015   Diabetes mellitus with nephropathy (HCC) 06/08/2015   Diabetes mellitus with peripheral vascular disease (HCC) 06/08/2015   Annual physical exam 01/03/2015   PAD (peripheral artery disease) (HCC) 02/18/2013   Diabetes (HCC) 02/18/2013   HTN (hypertension) 02/18/2013    ONSET DATE: "a couple years ago"   REFERRING DIAG: Diagnosis R26.81  (ICD-10-CM) - Unsteady gait  THERAPY DIAG:  Unsteadiness on feet  Muscle weakness (generalized)  Difficulty in walking, not elsewhere classified  Rationale for Evaluation and Treatment: Rehabilitation  SUBJECTIVE:                                                                                                                                                                                             SUBJECTIVE STATEMENT:  No changes since he was last here, no questions about the exercises.   Pt accompanied by: self  PERTINENT HISTORY: see above   PAIN:  Are you having pain? No  There were no vitals filed for this visit.    PRECAUTIONS: Fall  RED FLAGS: None   WEIGHT BEARING RESTRICTIONS: No  FALLS: Has patient fallen in last 6 months? No; no FOF  LIVING ENVIRONMENT: Lives with: lives with their spouse Lives in: House/apartment Stairs:  "couple of steps going into the home, no rails but not a big deal"  Has following equipment at home: None  PLOF: Independent, Independent with basic ADLs, Independent with gait, and Independent with transfers  PATIENT GOALS: not sure, was sent here by MD   On 12/9, pt states he wants his goal to be more stable   OBJECTIVE:  Note: Objective measures were completed at Evaluation unless otherwise noted.  DIAGNOSTIC FINDINGS:   COGNITION: Overall cognitive status: Within functional limits for tasks assessed   SENSATION: Hx of neuropathy after chemo     LOWER EXTREMITY MMT:    MMT Right Eval Left Eval  Hip flexion 3+ 3+  Hip extension 3 3  Hip abduction 3+ 3+  Hip adduction    Hip internal rotation    Hip external rotation    Knee flexion 4+ 4-  Knee extension 5 4+  Ankle dorsiflexion 5 4+  Ankle plantarflexion    Ankle inversion    Ankle eversion    (Blank rows = not tested)    STAIRS: Level of Assistance: Complete Independence Stair Negotiation Technique: Step to Pattern with No Rails Number of Stairs:  4  Height of Stairs: 6 inch   Comments: moderate unsteadiness with descent   GAIT: Gait pattern: step  through pattern, decreased arm swing- Right, decreased arm swing- Left, decreased ankle dorsiflexion- Right, decreased ankle dorsiflexion- Left, decreased trunk rotation, and trunk flexed Distance walked: 575ft  Assistive device utilized: None Level of assistance: Complete Independence Comments: increased hip fatigue, some hip pain with gait, + trendelenburg, R toe out with gait   FUNCTIONAL TESTS:  3 minute walk test: 573ft  Dynamic Gait Index: 17/24 VOR reflex intact, VOR cancellation OK   PATIENT SURVEYS:  Not in FOTO system at eval   TODAY'S TREATMENT:                                                                                                                              DATE: 05/07/23  Therapeutic Exercise:  SciFit with BUE/BLE Multi-peaks at gear 4.0 for 8 minutes for strengthening, aerobic activity, endurance/activity tolerance. Pt reporting RPE at 6-7/10 when doing the hills    NMR:  On air ex 10 reps sit <> stands with no UE support, initial cues with incr forward lean, performed from slightly elevated mat table 10 reps mini squats while holding 8# medicine ball  With feet hip width > more narrow BOS, holding 8# medicine ball and tossing to floor and catching it, performed 2 sets of 10 reps  With 6 blaze pods at stairs (3 on first step, 3 on 2nd step) on air ex, for SLS, weight shifting, reaction times, foot clearance, performed on random hit setting  Performed 3 bouts of 1 minute: 23 hits, 26 hits, 33 hits Pt reporting RPE as 7-8/10 Trying to perform without UE support, with intermittent support needed, mainly CGA for balance, but did need min A towards the end with pt more fatigued  On rockerboard in A/P direction:  Weight shifting x15 reps to try to utilize hip/ankle strategy, pt more challenged with shifting weight posteriorly  Self ball toss for perturbations 2  x 10 reps, when pt losing balance, able to utilize stepping strategy to maintain  PATIENT EDUCATION: Education details: Continue HEP   Person educated: Patient Education method: Explanation Education comprehension: verbalized understanding  HOME EXERCISE PROGRAM: Access Code: [email or web address] URL: [email or web address] Date: 05/05/2023 Prepared by: Sherlie Ban  Exercises - Supine Figure 4 Piriformis Stretch  - 1 x daily - 7 x weekly - 1 sets - 4 reps - 30 seconds  hold - Seated Figure 4 Piriformis Stretch  - 1 x daily - 7 x weekly - 1 sets - 4 reps - 30 seconds  hold - Standing Hip Abduction with Resistance at Thighs  - 1 x daily - 7 x weekly - 2 sets - 10 reps - 1 second hold - Supine Bridge with Resistance Band  - 1 x daily - 7 x weekly - 2 sets - 10 reps - 2 seconds  hold - Tandem Stance in Corner  - 1 x daily - 7 x weekly - 1 sets - 4 reps - 30 seconds  hold - Standing Balance with  Eyes Closed on Foam  - 1 x daily - 5 x weekly - 3 sets - 20-30 hold - Standing with Head Rotation  - 1 x daily - 5 x weekly - 2 sets - 10 reps and nods, on unlevel surface   GOALS: Goals reviewed with patient? No  SHORT TERM GOALS: Target date: 05/19/2023    Will be compliant with appropriate progressive HEP  Baseline: Goal status: INITIAL  2.  Piriformis muscle flexibility to have improved as evidenced by resolution of toe out R LE with gait  Baseline:  Goal status: INITIAL  3.  Hip pain to have resolved  Baseline:  Goal status: INITIAL  4. Will be able to verbalize importance of regular foot/skin checks given hx of DM and chemo induced neuropathy Baseline:  Goal status: INITIAL  5. Pt will improve condition 3 of mCTSIB to at least 30 seconds in order to demo improved balance on compliant surfaces.  Baseline: 20 seconds  Goal status: INITIAL    LONG TERM GOALS: Target date: 06/09/2023    MMT to improve by at least 1 grade in all weak groups Baseline:  Goal status:  INITIAL  2.  Will score at least 21/24 on DGI to show improved functional balance  Baseline:  Goal status: INITIAL  3.  Will be able to ascend/descend steps without significant unsteadiness especially with stair descent  Baseline:  Goal status: INITIAL  4.  Will be able to walk unlimited distances in the community without hip pain or unsteadiness in order to increase activity levels  Baseline:  Goal status: INITIAL  5. Pt will improve condition 4 of mCTSIB to at least 15 seconds in order to demo improved vestibular system for balance.  Baseline: 3 seconds  Goal status: INITIAL   ASSESSMENT:  CLINICAL IMPRESSION: Today's skilled session focused on BLE strength and balance tasks on compliant surfaces with focus on weight shifting, SLS, and ankle strategy. Pt most challenged by tasks on the rockerboard for balance and did fatigue with RPE at 7-8/10 with standing blaze pod activity for SLS. Will continue per POC.   OBJECTIVE IMPAIRMENTS: Abnormal gait, decreased activity tolerance, decreased balance, decreased mobility, difficulty walking, decreased strength, impaired flexibility, impaired sensation, and pain.   ACTIVITY LIMITATIONS: transfers and locomotion level  PARTICIPATION LIMITATIONS: driving, shopping, community activity, yard work, and church  PERSONAL FACTORS: Fitness, Past/current experiences, and Time since onset of injury/illness/exacerbation are also affecting patient's functional outcome.   REHAB POTENTIAL: Excellent  CLINICAL DECISION MAKING: Evolving/moderate complexity  EVALUATION COMPLEXITY: Moderate  PLAN:  PT FREQUENCY: 2x/week  PT DURATION: 6 weeks  PLANNED INTERVENTIONS: 97164- PT Re-evaluation, 97110-Therapeutic exercises, 97530- Therapeutic activity, 97112- Neuromuscular re-education, 97535- Self Care, 53664- Manual therapy, [phone]- Gait training, [phone]- Aquatic Therapy, [phone]- Ionotophoresis 4mg /ml Dexamethasone, Patient/Family education, Balance  training, Stair training, Taping, Dry Needling, and Vestibular training  PLAN FOR NEXT SESSION: BLE and hip strengthening, balance strategies - SLS, compliant surfaces, EC, work on sit <> stands with no UE support, work on [employer], PT, DPT 05/07/23 9:42 AM

## 2023-05-12 ENCOUNTER — Encounter: Payer: Self-pay | Admitting: Physical Therapy

## 2023-05-12 ENCOUNTER — Ambulatory Visit: Payer: Medicare Other | Admitting: Physical Therapy

## 2023-05-12 DIAGNOSIS — M6281 Muscle weakness (generalized): Secondary | ICD-10-CM

## 2023-05-12 DIAGNOSIS — R2681 Unsteadiness on feet: Secondary | ICD-10-CM | POA: Diagnosis not present

## 2023-05-12 DIAGNOSIS — R29898 Other symptoms and signs involving the musculoskeletal system: Secondary | ICD-10-CM

## 2023-05-12 DIAGNOSIS — R208 Other disturbances of skin sensation: Secondary | ICD-10-CM

## 2023-05-12 DIAGNOSIS — R262 Difficulty in walking, not elsewhere classified: Secondary | ICD-10-CM

## 2023-05-12 NOTE — Therapy (Signed)
OUTPATIENT PHYSICAL THERAPY NEURO TREATMENT   Patient Name: Kirk [name] MRN: [id number] DOB:12-10-1955, 67 y.o., male Today's Date: 05/12/2023   PCP: Ivonne Andrew, NP REFERRING PROVIDER: Ivonne Andrew, NP  END OF SESSION:  PT End of Session - 05/12/23 0852     Visit Number 4    Number of Visits 13    Date for PT Re-Evaluation 06/09/23    Authorization Type M[employer] and [employer]    Authorization Time Period 04/28/23 to 06/23/23    Progress Note Due on Visit 10    PT Start Time 0849    PT Stop Time 0929    PT Time Calculation (min) 40 min    Equipment Utilized During Treatment Gait belt    Activity Tolerance Patient tolerated treatment well    Behavior During Therapy WFL for tasks assessed/performed             Past Medical History:  Diagnosis Date   Arthritis    "right knee" (09/04/2015)   Atrial flutter with rapid ventricular response (HCC) 04/19/2019   Dizziness 10/2019   Dyspnea    increased exertion   Elevated serum creatinine 07/2020   HTN (hypertension)    Hyperlipidemia    Kidney disease    stage 3B   Lung cancer (HCC) 05/2019   Microalbuminuria 07/2020   NSTEMI (non-ST elevated myocardial infarction) (HCC) 09/04/2015   a. cath 09/05/2015: 95% stenosis mid-LAD, 55% mid-RCA, and 40% prox-RCA. PCI performed w/ Synergy DES to LAD   PAD (peripheral artery disease) (HCC)    Stenting of bilateral iliacs in 2003.   Refusal of blood transfusions as patient is Jehovah's Witness    Type II diabetes mellitus (HCC)    Vitamin D deficiency 07/2020   Past Surgical History:  Procedure Laterality Date   CARDIAC CATHETERIZATION N/A 09/05/2015   Procedure: Left Heart Cath and Coronary Angiography;  Surgeon: Lennette Bihari, MD;  Location: [facility];  Service: Cardiovascular;  Laterality: N/A;   CARDIAC CATHETERIZATION N/A 09/05/2015   Procedure: Coronary Stent Intervention;  Surgeon: Lennette Bihari, MD;  Location: [facility];  Service:  Cardiovascular;  Laterality: N/A;   IR IMAGING GUIDED PORT INSERTION  07/15/2019   ORIF CONGENITAL HIP DISLOCATION Left ~ 1965   "put pins in it"   PERIPHERAL VASCULAR CATHETERIZATION Bilateral 2003   "stenting"   TONSILLECTOMY  ~ 1963   Patient Active Problem List   Diagnosis Date Noted   Upper respiratory tract infection 07/11/2022   Iron deficiency anemia 03/28/2022   Need for immunization against influenza 03/08/2022   Chemotherapy-induced neuropathy (HCC) 05/31/2021   Drug-induced neutropenia (HCC) 12/26/2020   Port-A-Cath in place 12/29/2019   Metastatic non-small cell lung cancer (HCC) 07/07/2019   Encounter for antineoplastic chemotherapy 07/07/2019   Encounter for antineoplastic immunotherapy 07/07/2019   Goals of care, counseling/discussion 06/28/2019   Adenocarcinoma of right lung, stage 4 (HCC) 06/28/2019   Cancer associated pain 06/28/2019   CAD (coronary artery disease) 06/08/2019   Mediastinal lymphadenopathy 06/03/2019   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) 06/03/2019   Hemoglobin A1C between 7% and 9% indicating borderline diabetic control (HCC) 05/10/2019   COVID-19 virus infection 05/10/2019   Atrial flutter with rapid ventricular response (HCC) 04/19/2019   Chest pain with moderate risk for cardiac etiology 04/19/2019   Paroxysmal atrial flutter (HCC) 04/19/2019   Chest congestion 07/10/2018   Cough 07/10/2018   NSTEMI (non-ST elevated myocardial infarction) (HCC) 09/04/2015   PVD (  peripheral vascular disease) (HCC) 06/08/2015   DDD (degenerative disc disease), cervical 06/08/2015   Hyperlipidemia 06/08/2015   Diabetes mellitus with nephropathy (HCC) 06/08/2015   Diabetes mellitus with peripheral vascular disease (HCC) 06/08/2015   Annual physical exam 01/03/2015   PAD (peripheral artery disease) (HCC) 02/18/2013   Diabetes (HCC) 02/18/2013   HTN (hypertension) 02/18/2013    ONSET DATE: "a couple years ago"   REFERRING DIAG: Diagnosis R26.81  (ICD-10-CM) - Unsteady gait  THERAPY DIAG:  Unsteadiness on feet  Muscle weakness (generalized)  Difficulty in walking, not elsewhere classified  Other disturbances of skin sensation  Other symptoms and signs involving the musculoskeletal system  Rationale for Evaluation and Treatment: Rehabilitation  SUBJECTIVE:                                                                                                                                                                                             SUBJECTIVE STATEMENT:  He denies falls or pain at current.  He has not done his HEP yet.  He does not feel like doing them and feels he does not have enough time to do them.  Pt accompanied by: self  PERTINENT HISTORY: see above   PAIN:  Are you having pain? No  There were no vitals filed for this visit.    PRECAUTIONS: Fall  RED FLAGS: None   WEIGHT BEARING RESTRICTIONS: No  FALLS: Has patient fallen in last 6 months? No; no FOF  LIVING ENVIRONMENT: Lives with: lives with their spouse Lives in: House/apartment Stairs:  "couple of steps going into the home, no rails but not a big deal"  Has following equipment at home: None  PLOF: Independent, Independent with basic ADLs, Independent with gait, and Independent with transfers  PATIENT GOALS: not sure, was sent here by MD   On 12/9, pt states he wants his goal to be more stable   OBJECTIVE:  Note: Objective measures were completed at Evaluation unless otherwise noted.  DIAGNOSTIC FINDINGS:   COGNITION: Overall cognitive status: Within functional limits for tasks assessed   SENSATION: Hx of neuropathy after chemo     LOWER EXTREMITY MMT:    MMT Right Eval Left Eval  Hip flexion 3+ 3+  Hip extension 3 3  Hip abduction 3+ 3+  Hip adduction    Hip internal rotation    Hip external rotation    Knee flexion 4+ 4-  Knee extension 5 4+  Ankle dorsiflexion 5 4+  Ankle plantarflexion    Ankle inversion     Ankle eversion    (Blank rows = not tested)    STAIRS: Level  of Assistance: Complete Independence Stair Negotiation Technique: Step to Pattern with No Rails Number of Stairs: 4  Height of Stairs: 6 inch   Comments: moderate unsteadiness with descent   GAIT: Gait pattern: step through pattern, decreased arm swing- Right, decreased arm swing- Left, decreased ankle dorsiflexion- Right, decreased ankle dorsiflexion- Left, decreased trunk rotation, and trunk flexed Distance walked: 567ft  Assistive device utilized: None Level of assistance: Complete Independence Comments: increased hip fatigue, some hip pain with gait, + trendelenburg, R toe out with gait   FUNCTIONAL TESTS:  3 minute walk test: 54ft  Dynamic Gait Index: 17/24 VOR reflex intact, VOR cancellation OK   PATIENT SURVEYS:  Not in FOTO system at eval   TODAY'S TREATMENT:                                                                                                                              DATE: 05/12/23  Therapeutic Exercise:  SciFit with BLE only at level 3.0-5.0 for 8 minutes for LE strengthening, activity tolerance, and cardiovascular endurance. Pt reporting RPE at 7-8/10 at end of task.    NMR:  Alternating 8" step taps x20, pt relies on full foot contact to step to stabilize Alternating half foot elevated on 8" step performing rotation pallof press w/ 8 lb ball x12 each direction STS no UE support x6 > STS w/ 6lb pallof press in standing 2x6 A/P tilt board: Holding level 2x1 minute unsupported Holding level eyes closed varying times several reps regressed to PT providing min-modA for support to prevent posterior LOB, difficulty utilizing appropriate balance strategies resulting in increased sway and LOB Head turns 2x1 minute > head nods 2x~40 seconds, pt has more difficulty with this needing intermittent touch support Crossbody cone taps progressing to unilateral UE support x20, min sway  PATIENT  EDUCATION: Education details: Please attempt HEP before next visit.  Person educated: Patient Education method: Explanation Education comprehension: verbalized understanding  HOME EXERCISE PROGRAM: Access Code: [email or web address] URL: [email or web address] Date: 05/05/2023 Prepared by: Sherlie Ban  Exercises - Supine Figure 4 Piriformis Stretch  - 1 x daily - 7 x weekly - 1 sets - 4 reps - 30 seconds  hold - Seated Figure 4 Piriformis Stretch  - 1 x daily - 7 x weekly - 1 sets - 4 reps - 30 seconds  hold - Standing Hip Abduction with Resistance at Thighs  - 1 x daily - 7 x weekly - 2 sets - 10 reps - 1 second hold - Supine Bridge with Resistance Band  - 1 x daily - 7 x weekly - 2 sets - 10 reps - 2 seconds  hold - Tandem Stance in Corner  - 1 x daily - 7 x weekly - 1 sets - 4 reps - 30 seconds  hold - Standing Balance with Eyes Closed on Foam  - 1 x daily - 5 x weekly - 3 sets - 20-30 hold - Standing with Head  Rotation  - 1 x daily - 5 x weekly - 2 sets - 10 reps and nods, on unlevel surface   GOALS: Goals reviewed with patient? No  SHORT TERM GOALS: Target date: 05/19/2023    Will be compliant with appropriate progressive HEP  Baseline: Goal status: INITIAL  2.  Piriformis muscle flexibility to have improved as evidenced by resolution of toe out R LE with gait  Baseline:  Goal status: INITIAL  3.  Hip pain to have resolved  Baseline:  Goal status: INITIAL  4. Will be able to verbalize importance of regular foot/skin checks given hx of DM and chemo induced neuropathy Baseline:  Goal status: INITIAL  5. Pt will improve condition 3 of mCTSIB to at least 30 seconds in order to demo improved balance on compliant surfaces.  Baseline: 20 seconds  Goal status: INITIAL    LONG TERM GOALS: Target date: 06/09/2023    MMT to improve by at least 1 grade in all weak groups Baseline:  Goal status: INITIAL  2.  Will score at least 21/24 on DGI to show improved  functional balance  Baseline:  Goal status: INITIAL  3.  Will be able to ascend/descend steps without significant unsteadiness especially with stair descent  Baseline:  Goal status: INITIAL  4.  Will be able to walk unlimited distances in the community without hip pain or unsteadiness in order to increase activity levels  Baseline:  Goal status: INITIAL  5. Pt will improve condition 4 of mCTSIB to at least 15 seconds in order to demo improved vestibular system for balance.  Baseline: 3 seconds  Goal status: INITIAL   ASSESSMENT:  CLINICAL IMPRESSION: Emphasis of skilled session today on continuing to progress SLS tasks.  Patient is challenged by eyes closed on rocker board and he would like benefit from further ankle, hip, and stepping strategy challenges to upweight his natural response to imbalance as he has inadequate responses at current.  He continues to benefit from skilled PT to progress towards goals as able.  He may also benefit from HEP review in the future.  OBJECTIVE IMPAIRMENTS: Abnormal gait, decreased activity tolerance, decreased balance, decreased mobility, difficulty walking, decreased strength, impaired flexibility, impaired sensation, and pain.   ACTIVITY LIMITATIONS: transfers and locomotion level  PARTICIPATION LIMITATIONS: driving, shopping, community activity, yard work, and church  PERSONAL FACTORS: Fitness, Past/current experiences, and Time since onset of injury/illness/exacerbation are also affecting patient's functional outcome.   REHAB POTENTIAL: Excellent  CLINICAL DECISION MAKING: Evolving/moderate complexity  EVALUATION COMPLEXITY: Moderate  PLAN:  PT FREQUENCY: 2x/week  PT DURATION: 6 weeks  PLANNED INTERVENTIONS: 97164- PT Re-evaluation, 97110-Therapeutic exercises, 97530- Therapeutic activity, 97112- Neuromuscular re-education, 97535- Self Care, 11914- Manual therapy, [phone]- Gait training, [phone]- Aquatic Therapy, [phone]- Ionotophoresis  4mg /ml Dexamethasone, Patient/Family education, Balance training, Stair training, Taping, Dry Needling, and Vestibular training  PLAN FOR NEXT SESSION: BLE and hip strengthening, balance strategies - SLS, compliant surfaces, EC, work on sit <> stands with no UE support, work on rockerboard, reactive stepping, HEP review?   Camille Bal, PT, DPT 05/12/23 9:33 AM

## 2023-05-14 ENCOUNTER — Ambulatory Visit: Payer: Medicare Other | Admitting: Physical Therapy

## 2023-05-14 ENCOUNTER — Encounter: Payer: Self-pay | Admitting: Physical Therapy

## 2023-05-14 DIAGNOSIS — R2681 Unsteadiness on feet: Secondary | ICD-10-CM

## 2023-05-14 DIAGNOSIS — R262 Difficulty in walking, not elsewhere classified: Secondary | ICD-10-CM

## 2023-05-14 DIAGNOSIS — M6281 Muscle weakness (generalized): Secondary | ICD-10-CM

## 2023-05-14 NOTE — Therapy (Signed)
OUTPATIENT PHYSICAL THERAPY NEURO TREATMENT   Patient Name: Kirk [name] MRN: [id number] DOB:Jul 27, 1955, 67 y.o., male Today's Date: 05/14/2023   PCP: Ivonne Andrew, NP REFERRING PROVIDER: Ivonne Andrew, NP  END OF SESSION:  PT End of Session - 05/14/23 0847     Visit Number 5    Number of Visits 13    Date for PT Re-Evaluation 06/09/23    Authorization Type M[employer] and [employer]    Authorization Time Period 04/28/23 to 06/23/23    Progress Note Due on Visit 10    PT Start Time 0846    PT Stop Time 0928    PT Time Calculation (min) 42 min    Equipment Utilized During Treatment Gait belt    Activity Tolerance Patient tolerated treatment well    Behavior During Therapy WFL for tasks assessed/performed             Past Medical History:  Diagnosis Date   Arthritis    "right knee" (09/04/2015)   Atrial flutter with rapid ventricular response (HCC) 04/19/2019   Dizziness 10/2019   Dyspnea    increased exertion   Elevated serum creatinine 07/2020   HTN (hypertension)    Hyperlipidemia    Kidney disease    stage 3B   Lung cancer (HCC) 05/2019   Microalbuminuria 07/2020   NSTEMI (non-ST elevated myocardial infarction) (HCC) 09/04/2015   a. cath 09/05/2015: 95% stenosis mid-LAD, 55% mid-RCA, and 40% prox-RCA. PCI performed w/ Synergy DES to LAD   PAD (peripheral artery disease) (HCC)    Stenting of bilateral iliacs in 2003.   Refusal of blood transfusions as patient is Jehovah's Witness    Type II diabetes mellitus (HCC)    Vitamin D deficiency 07/2020   Past Surgical History:  Procedure Laterality Date   CARDIAC CATHETERIZATION N/A 09/05/2015   Procedure: Left Heart Cath and Coronary Angiography;  Surgeon: Lennette Bihari, MD;  Location: [facility];  Service: Cardiovascular;  Laterality: N/A;   CARDIAC CATHETERIZATION N/A 09/05/2015   Procedure: Coronary Stent Intervention;  Surgeon: Lennette Bihari, MD;  Location: [facility];  Service:  Cardiovascular;  Laterality: N/A;   IR IMAGING GUIDED PORT INSERTION  07/15/2019   ORIF CONGENITAL HIP DISLOCATION Left ~ 1965   "put pins in it"   PERIPHERAL VASCULAR CATHETERIZATION Bilateral 2003   "stenting"   TONSILLECTOMY  ~ 1963   Patient Active Problem List   Diagnosis Date Noted   Upper respiratory tract infection 07/11/2022   Iron deficiency anemia 03/28/2022   Need for immunization against influenza 03/08/2022   Chemotherapy-induced neuropathy (HCC) 05/31/2021   Drug-induced neutropenia (HCC) 12/26/2020   Port-A-Cath in place 12/29/2019   Metastatic non-small cell lung cancer (HCC) 07/07/2019   Encounter for antineoplastic chemotherapy 07/07/2019   Encounter for antineoplastic immunotherapy 07/07/2019   Goals of care, counseling/discussion 06/28/2019   Adenocarcinoma of right lung, stage 4 (HCC) 06/28/2019   Cancer associated pain 06/28/2019   CAD (coronary artery disease) 06/08/2019   Mediastinal lymphadenopathy 06/03/2019   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) 06/03/2019   Hemoglobin A1C between 7% and 9% indicating borderline diabetic control (HCC) 05/10/2019   COVID-19 virus infection 05/10/2019   Atrial flutter with rapid ventricular response (HCC) 04/19/2019   Chest pain with moderate risk for cardiac etiology 04/19/2019   Paroxysmal atrial flutter (HCC) 04/19/2019   Chest congestion 07/10/2018   Cough 07/10/2018   NSTEMI (non-ST elevated myocardial infarction) (HCC) 09/04/2015   PVD (  peripheral vascular disease) (HCC) 06/08/2015   DDD (degenerative disc disease), cervical 06/08/2015   Hyperlipidemia 06/08/2015   Diabetes mellitus with nephropathy (HCC) 06/08/2015   Diabetes mellitus with peripheral vascular disease (HCC) 06/08/2015   Annual physical exam 01/03/2015   PAD (peripheral artery disease) (HCC) 02/18/2013   Diabetes (HCC) 02/18/2013   HTN (hypertension) 02/18/2013    ONSET DATE: "a couple years ago"   REFERRING DIAG: Diagnosis R26.81  (ICD-10-CM) - Unsteady gait  THERAPY DIAG:  Unsteadiness on feet  Muscle weakness (generalized)  Difficulty in walking, not elsewhere classified  Rationale for Evaluation and Treatment: Rehabilitation  SUBJECTIVE:                                                                                                                                                                                             SUBJECTIVE STATEMENT:  Reports HEP is going "ehhh". Reports balance still feels the same. Feels like his unsteadiness is coming from inside his head. Pt reports hip is doing better  Pt accompanied by: self  PERTINENT HISTORY: see above   PAIN:  Are you having pain? No  There were no vitals filed for this visit.   PRECAUTIONS: Fall  RED FLAGS: None   WEIGHT BEARING RESTRICTIONS: No  FALLS: Has patient fallen in last 6 months? No; no FOF  LIVING ENVIRONMENT: Lives with: lives with their spouse Lives in: House/apartment Stairs:  "couple of steps going into the home, no rails but not a big deal"  Has following equipment at home: None  PLOF: Independent, Independent with basic ADLs, Independent with gait, and Independent with transfers  PATIENT GOALS: not sure, was sent here by MD   On 12/9, pt states he wants his goal to be more stable   OBJECTIVE:  Note: Objective measures were completed at Evaluation unless otherwise noted.  DIAGNOSTIC FINDINGS:   COGNITION: Overall cognitive status: Within functional limits for tasks assessed   SENSATION: Hx of neuropathy after chemo     LOWER EXTREMITY MMT:    MMT Right Eval Left Eval  Hip flexion 3+ 3+  Hip extension 3 3  Hip abduction 3+ 3+  Hip adduction    Hip internal rotation    Hip external rotation    Knee flexion 4+ 4-  Knee extension 5 4+  Ankle dorsiflexion 5 4+  Ankle plantarflexion    Ankle inversion    Ankle eversion    (Blank rows = not tested)    STAIRS: Level of Assistance: Complete  Independence Stair Negotiation Technique: Step to Pattern with No Rails Number of Stairs: 4  Height of Stairs: 6 inch  Comments: moderate unsteadiness with descent   GAIT: Gait pattern: step through pattern, decreased arm swing- Right, decreased arm swing- Left, decreased ankle dorsiflexion- Right, decreased ankle dorsiflexion- Left, decreased trunk rotation, and trunk flexed Distance walked: 531ft  Assistive device utilized: None Level of assistance: Complete Independence Comments: increased hip fatigue, some hip pain with gait, + trendelenburg, R toe out with gait   FUNCTIONAL TESTS:  3 minute walk test: 561ft  Dynamic Gait Index: 17/24 VOR reflex intact, VOR cancellation OK   PATIENT SURVEYS:  Not in FOTO system at eval   TODAY'S TREATMENT:                                                                                                                              DATE: 05/14/23  Therapeutic Exercise:  SciFit with BUE/BLE Multi-peaks at gear 4.5 > 5.0 for 8 minutes for strengthening, aerobic activity, endurance/activity tolerance. Pt reporting RPE at 5/10  With red t-band around thighs for resistance: Forward marching down and back x3 reps with focus on hip flexion and slowed pace, frequent UE support for balance  Side stepping down and back x3 reps for hip ABD strengthening, cues to hold mini squat position  Pt needing a seated rest break between each exercise due to fatigue   NMR:  On blue foam beam: Side stepping and alternating SLS taps to 3 colorful gum drops or 2 cones down and back x3 reps, pt mainly needing UE/fingertip support for balance, able to try without UE support for a little bit with encouragement from therapist  On air ex: Wide BOS EC 4  x 30 seconds, pt with tendency to lose balance posteriorly and needs intermittent UE support, cues for ankle strategy, last rep pt able to do entirety of 30 seconds with no UE support  Feet together EO 2 x 10 reps head turns,  2 x 10 reps head nods, pt more imbalanced with head nods  Sit <> stands 3 x 5 reps with EC with no UE support from mat table, initially CGA for balance, decr to more close supervision   PATIENT EDUCATION: Education details: Performing HEP at home  Person educated: Patient Education method: Explanation Education comprehension: verbalized understanding  HOME EXERCISE PROGRAM: Access Code: [email or web address] URL: [email or web address] Date: 05/05/2023 Prepared by: Sherlie Ban  Exercises - Supine Figure 4 Piriformis Stretch  - 1 x daily - 7 x weekly - 1 sets - 4 reps - 30 seconds  hold - Seated Figure 4 Piriformis Stretch  - 1 x daily - 7 x weekly - 1 sets - 4 reps - 30 seconds  hold - Standing Hip Abduction with Resistance at Thighs  - 1 x daily - 7 x weekly - 2 sets - 10 reps - 1 second hold - Supine Bridge with Resistance Band  - 1 x daily - 7 x weekly - 2 sets - 10 reps - 2 seconds  hold - Tandem Stance in Corner  -  1 x daily - 7 x weekly - 1 sets - 4 reps - 30 seconds  hold - Standing Balance with Eyes Closed on Foam  - 1 x daily - 5 x weekly - 3 sets - 20-30 hold - Standing with Head Rotation  - 1 x daily - 5 x weekly - 2 sets - 10 reps and nods, on unlevel surface   GOALS: Goals reviewed with patient? No  SHORT TERM GOALS: Target date: 05/19/2023    Will be compliant with appropriate progressive HEP  Baseline: Goal status: INITIAL  2.  Piriformis muscle flexibility to have improved as evidenced by resolution of toe out R LE with gait  Baseline:  Goal status: INITIAL  3.  Hip pain to have resolved  Baseline:  Goal status: INITIAL  4. Will be able to verbalize importance of regular foot/skin checks given hx of DM and chemo induced neuropathy Baseline:  Goal status: INITIAL  5. Pt will improve condition 3 of mCTSIB to at least 30 seconds in order to demo improved balance on compliant surfaces.  Baseline: 20 seconds  Goal status: INITIAL    LONG TERM GOALS:  Target date: 06/09/2023    MMT to improve by at least 1 grade in all weak groups Baseline:  Goal status: INITIAL  2.  Will score at least 21/24 on DGI to show improved functional balance  Baseline:  Goal status: INITIAL  3.  Will be able to ascend/descend steps without significant unsteadiness especially with stair descent  Baseline:  Goal status: INITIAL  4.  Will be able to walk unlimited distances in the community without hip pain or unsteadiness in order to increase activity levels  Baseline:  Goal status: INITIAL  5. Pt will improve condition 4 of mCTSIB to at least 15 seconds in order to demo improved vestibular system for balance.  Baseline: 3 seconds  Goal status: INITIAL   ASSESSMENT:  CLINICAL IMPRESSION: Today's skilled session continued to focus on BLE strengthening, SLS tasks, and balance on compliant surfaces for vestibular input. Pt needing intermittent seated rest breaks after leg strengthening exercises with resistance. Pt challenged by SLS tasks on unlevel surfaces and needing frequent UE support for balance. With EC with wide BOS on foam, pt initially losing balance more posteriorly. With cues and incr reps, pt able to utilize ankle strategy more to maintain balance. Will continue per POC.    OBJECTIVE IMPAIRMENTS: Abnormal gait, decreased activity tolerance, decreased balance, decreased mobility, difficulty walking, decreased strength, impaired flexibility, impaired sensation, and pain.   ACTIVITY LIMITATIONS: transfers and locomotion level  PARTICIPATION LIMITATIONS: driving, shopping, community activity, yard work, and church  PERSONAL FACTORS: Fitness, Past/current experiences, and Time since onset of injury/illness/exacerbation are also affecting patient's functional outcome.   REHAB POTENTIAL: Excellent  CLINICAL DECISION MAKING: Evolving/moderate complexity  EVALUATION COMPLEXITY: Moderate  PLAN:  PT FREQUENCY: 2x/week  PT DURATION: 6  weeks  PLANNED INTERVENTIONS: 97164- PT Re-evaluation, 97110-Therapeutic exercises, 97530- Therapeutic activity, 97112- Neuromuscular re-education, 97535- Self Care, 53664- Manual therapy, [phone]- Gait training, [phone]- Aquatic Therapy, [phone]- Ionotophoresis 4mg /ml Dexamethasone, Patient/Family education, Balance training, Stair training, Taping, Dry Needling, and Vestibular training  PLAN FOR NEXT SESSION: BLE and hip strengthening, balance strategies - SLS, compliant surfaces, EC, work on sit <> stands with no UE support, work on rockerboard, reactive stepping.   STGs due next week (sorry I did not write STGs #1-4)  Sherlie Ban, PT, DPT 05/14/23 11:40 AM

## 2023-05-15 ENCOUNTER — Inpatient Hospital Stay: Payer: Medicare Other | Attending: Internal Medicine

## 2023-05-15 DIAGNOSIS — C787 Secondary malignant neoplasm of liver and intrahepatic bile duct: Secondary | ICD-10-CM | POA: Diagnosis not present

## 2023-05-15 DIAGNOSIS — D649 Anemia, unspecified: Secondary | ICD-10-CM | POA: Diagnosis not present

## 2023-05-15 DIAGNOSIS — C3490 Malignant neoplasm of unspecified part of unspecified bronchus or lung: Secondary | ICD-10-CM | POA: Insufficient documentation

## 2023-05-15 DIAGNOSIS — Z95828 Presence of other vascular implants and grafts: Secondary | ICD-10-CM

## 2023-05-15 MED ORDER — SODIUM CHLORIDE 0.9% FLUSH
10.0000 mL | Freq: Once | INTRAVENOUS | Status: AC
  Administered 2023-05-15: 10 mL

## 2023-05-15 MED ORDER — HEPARIN SOD (PORK) LOCK FLUSH 100 UNIT/ML IV SOLN
500.0000 [IU] | Freq: Once | INTRAVENOUS | Status: AC
  Administered 2023-05-15: 500 [IU]

## 2023-05-19 ENCOUNTER — Ambulatory Visit: Payer: Medicare Other | Admitting: Physical Therapy

## 2023-05-20 ENCOUNTER — Ambulatory Visit: Payer: Medicare Other | Admitting: Physical Therapy

## 2023-05-23 ENCOUNTER — Ambulatory Visit: Payer: Medicare Other | Admitting: Physical Therapy

## 2023-05-26 ENCOUNTER — Encounter: Payer: Self-pay | Admitting: Physical Therapy

## 2023-05-26 ENCOUNTER — Ambulatory Visit: Payer: Medicare Other | Admitting: Physical Therapy

## 2023-05-26 DIAGNOSIS — R2681 Unsteadiness on feet: Secondary | ICD-10-CM

## 2023-05-26 DIAGNOSIS — M6281 Muscle weakness (generalized): Secondary | ICD-10-CM

## 2023-05-26 DIAGNOSIS — R262 Difficulty in walking, not elsewhere classified: Secondary | ICD-10-CM

## 2023-05-26 NOTE — Therapy (Signed)
OUTPATIENT PHYSICAL THERAPY NEURO TREATMENT   Patient Name: Kirk [name] MRN: [id number] DOB:07/17/1955, 67 y.o., male Today's Date: 05/26/2023   PCP: Ivonne Andrew, NP REFERRING PROVIDER: Ivonne Andrew, NP  END OF SESSION:  PT End of Session - 05/26/23 0852     Visit Number 6    Number of Visits 13    Date for PT Re-Evaluation 06/09/23    Authorization Type M[employer] and [employer]    Authorization Time Period 04/28/23 to 06/23/23    Progress Note Due on Visit 10    PT Start Time 0845    PT Stop Time 0928    PT Time Calculation (min) 43 min    Equipment Utilized During Treatment Gait belt    Activity Tolerance Patient tolerated treatment well    Behavior During Therapy WFL for tasks assessed/performed             Past Medical History:  Diagnosis Date   Arthritis    "right knee" (09/04/2015)   Atrial flutter with rapid ventricular response (HCC) 04/19/2019   Dizziness 10/2019   Dyspnea    increased exertion   Elevated serum creatinine 07/2020   HTN (hypertension)    Hyperlipidemia    Kidney disease    stage 3B   Lung cancer (HCC) 05/2019   Microalbuminuria 07/2020   NSTEMI (non-ST elevated myocardial infarction) (HCC) 09/04/2015   a. cath 09/05/2015: 95% stenosis mid-LAD, 55% mid-RCA, and 40% prox-RCA. PCI performed w/ Synergy DES to LAD   PAD (peripheral artery disease) (HCC)    Stenting of bilateral iliacs in 2003.   Refusal of blood transfusions as patient is Jehov[employer]'s Witness    Type II diabetes mellitus (HCC)    Vitamin D deficiency 07/2020   Past Surgical History:  Procedure Laterality Date   CARDIAC CATHETERIZATION N/A 09/05/2015   Procedure: Left Heart Cath and Coronary Angiography;  Surgeon: Lennette Bihari, MD;  Location: [facility];  Service: Cardiovascular;  Laterality: N/A;   CARDIAC CATHETERIZATION N/A 09/05/2015   Procedure: Coronary Stent Intervention;  Surgeon: Lennette Bihari, MD;  Location: [facility];  Service:  Cardiovascular;  Laterality: N/A;   IR IMAGING GUIDED PORT INSERTION  07/15/2019   ORIF CONGENITAL HIP DISLOCATION Left ~ 1965   "put pins in it"   PERIPHERAL VASCULAR CATHETERIZATION Bilateral 2003   "stenting"   TONSILLECTOMY  ~ 1963   Patient Active Problem List   Diagnosis Date Noted   Upper respiratory tract infection 07/11/2022   Iron deficiency anemia 03/28/2022   Need for immunization against influenza 03/08/2022   Chemotherapy-induced neuropathy (HCC) 05/31/2021   Drug-induced neutropenia (HCC) 12/26/2020   Port-A-Cath in place 12/29/2019   Metastatic non-small cell lung cancer (HCC) 07/07/2019   Encounter for antineoplastic chemotherapy 07/07/2019   Encounter for antineoplastic immunotherapy 07/07/2019   Goals of care, counseling/discussion 06/28/2019   Adenocarcinoma of right lung, stage 4 (HCC) 06/28/2019   Cancer associated pain 06/28/2019   CAD (coronary artery disease) 06/08/2019   Mediastinal lymphadenopathy 06/03/2019   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) 06/03/2019   Hemoglobin A1C between 7% and 9% indicating borderline diabetic control (HCC) 05/10/2019   COVID-19 virus infection 05/10/2019   Atrial flutter with rapid ventricular response (HCC) 04/19/2019   Chest pain with moderate risk for cardiac etiology 04/19/2019   Paroxysmal atrial flutter (HCC) 04/19/2019   Chest congestion 07/10/2018   Cough 07/10/2018   NSTEMI (non-ST elevated myocardial infarction) (HCC) 09/04/2015   PVD (  peripheral vascular disease) (HCC) 06/08/2015   DDD (degenerative disc disease), cervical 06/08/2015   Hyperlipidemia 06/08/2015   Diabetes mellitus with nephropathy (HCC) 06/08/2015   Diabetes mellitus with peripheral vascular disease (HCC) 06/08/2015   Annual physical exam 01/03/2015   PAD (peripheral artery disease) (HCC) 02/18/2013   Diabetes (HCC) 02/18/2013   HTN (hypertension) 02/18/2013    ONSET DATE: "a couple years ago"   REFERRING DIAG: Diagnosis R26.81  (ICD-10-CM) - Unsteady gait  THERAPY DIAG:  Unsteadiness on feet  Muscle weakness (generalized)  Difficulty in walking, not elsewhere classified  Rationale for Evaluation and Treatment: Rehabilitation  SUBJECTIVE:                                                                                                                                                                                             SUBJECTIVE STATEMENT:  "I got it, I just need to do it" pt reports in regards to HEP performance.  He denies pain or recent falls.  Pt accompanied by: self  PERTINENT HISTORY: see above   PAIN:  Are you having pain? No  There were no vitals filed for this visit.   PRECAUTIONS: Fall  RED FLAGS: None   WEIGHT BEARING RESTRICTIONS: No  FALLS: Has patient fallen in last 6 months? No; no FOF  LIVING ENVIRONMENT: Lives with: lives with their spouse Lives in: House/apartment Stairs:  "couple of steps going into the home, no rails but not a big deal"  Has following equipment at home: None  PLOF: Independent, Independent with basic ADLs, Independent with gait, and Independent with transfers  PATIENT GOALS: not sure, was sent here by MD   On 12/9, pt states he wants his goal to be more stable   OBJECTIVE:  Note: Objective measures were completed at Evaluation unless otherwise noted.  DIAGNOSTIC FINDINGS:   COGNITION: Overall cognitive status: Within functional limits for tasks assessed   SENSATION: Hx of neuropathy after chemo     LOWER EXTREMITY MMT:    MMT Right Eval Left Eval  Hip flexion 3+ 3+  Hip extension 3 3  Hip abduction 3+ 3+  Hip adduction    Hip internal rotation    Hip external rotation    Knee flexion 4+ 4-  Knee extension 5 4+  Ankle dorsiflexion 5 4+  Ankle plantarflexion    Ankle inversion    Ankle eversion    (Blank rows = not tested)    STAIRS: Level of Assistance: Complete Independence Stair Negotiation Technique: Step to  Pattern with No Rails Number of Stairs: 4  Height of Stairs: 6 inch   Comments: moderate  unsteadiness with descent   GAIT: Gait pattern: step through pattern, decreased arm swing- Right, decreased arm swing- Left, decreased ankle dorsiflexion- Right, decreased ankle dorsiflexion- Left, decreased trunk rotation, and trunk flexed Distance walked: 5105ft  Assistive device utilized: None Level of assistance: Complete Independence Comments: increased hip fatigue, some hip pain with gait, + trendelenburg, R toe out with gait   FUNCTIONAL TESTS:  3 minute walk test: 559ft  Dynamic Gait Index: 17/24 VOR reflex intact, VOR cancellation OK   PATIENT SURVEYS:  Not in FOTO system at eval   TODAY'S TREATMENT:                                                                                                                              DATE: 05/26/23  Therapeutic Exercise:  SciFit with BLE only in hill mode at gear 6.0 for 8 minutes for strengthening, aerobic activity, endurance/activity tolerance. Pt reporting RPE at 5-6/10 STS w/ bilateral 3lb overhead press x10 > STS w/ bilateral 3lb chest press x10 SBA Forward half lunges 2x8 alternating LE w/ light touch support to maintain midline in // bars Therapeutic Activity: Confirmed pt has current HEP and has no questions. Pt ambulates x200 ft w/ bilateral toe out Pt has has some intermittent mild right hip pain since evaluation, but denies today. Pt is doing skin checks maybe once a week, discussed daily checks being ideal and using mirror and piriformis stretch posture to do these.  Edu on safety with neuropathy. mCTSIB: Condition 1:  30 seconds Condition 2:  30 seconds Condition 3:  30 seconds Condition 4:  5 seconds  PATIENT EDUCATION: Education details: Progress towards goals and compliance to HEP.  Discussed things he can do to progress towards LTGs as written. Person educated: Patient Education method: Explanation Education comprehension:  verbalized understanding  HOME EXERCISE PROGRAM: Access Code: [email or web address] URL: [email or web address] Date: 05/05/2023 Prepared by: Sherlie Ban  Exercises - Supine Figure 4 Piriformis Stretch  - 1 x daily - 7 x weekly - 1 sets - 4 reps - 30 seconds  hold - Seated Figure 4 Piriformis Stretch  - 1 x daily - 7 x weekly - 1 sets - 4 reps - 30 seconds  hold - Standing Hip Abduction with Resistance at Thighs  - 1 x daily - 7 x weekly - 2 sets - 10 reps - 1 second hold - Supine Bridge with Resistance Band  - 1 x daily - 7 x weekly - 2 sets - 10 reps - 2 seconds  hold - Tandem Stance in Corner  - 1 x daily - 7 x weekly - 1 sets - 4 reps - 30 seconds  hold - Standing Balance with Eyes Closed on Foam  - 1 x daily - 5 x weekly - 3 sets - 20-30 hold - Standing with Head Rotation  - 1 x daily - 5 x weekly - 2 sets - 10 reps and nods, on unlevel surface  GOALS: Goals reviewed with patient? No  SHORT TERM GOALS: Target date: 05/19/2023    Will be compliant with appropriate progressive HEP  Baseline:  Pt no compliant (12/30) Goal status: NOT MET  2.  Piriformis muscle flexibility to have improved as evidenced by resolution of toe out R LE with gait  Baseline: Bilateral toe out (12/30) Goal status: NOT MET  3.  Hip pain to have resolved  Baseline: Intermittent right hip stiffness, none at current (12/30) Goal status: MET  4. Will be able to verbalize importance of regular foot/skin checks given hx of DM and chemo induced neuropathy Baseline: once a week (12/30) Goal status: ONGOING  5. Pt will improve condition 3 of mCTSIB to at least 30 seconds in order to demo improved balance on compliant surfaces.  Baseline: 20 seconds; 30 seconds (12/30) Goal status: MET    LONG TERM GOALS: Target date: 06/09/2023    MMT to improve by at least 1 grade in all weak groups Baseline:  Goal status: INITIAL  2.  Will score at least 21/24 on DGI to show improved functional balance   Baseline:  Goal status: INITIAL  3.  Will be able to ascend/descend steps without significant unsteadiness especially with stair descent  Baseline:  Goal status: INITIAL  4.  Will be able to walk unlimited distances in the community without hip pain or unsteadiness in order to increase activity levels  Baseline:  Goal status: INITIAL  5. Pt will improve condition 4 of mCTSIB to at least 15 seconds in order to demo improved vestibular system for balance.  Baseline: 3 seconds  Goal status: INITIAL   ASSESSMENT:  CLINICAL IMPRESSION: Focus of skilled session today on short term goal assessment.  Pt continues to have bilateral toe out, but this PT is uncertain how much of this is a flexible ambulatory change vs anatomic preference.  He is not compliant to his HEP with PT discussing relation of certain stretches with ease of foot checks this visit with hope of improved engagement.  He is doing skin checks on his feet roughly once a week as he feels "my neuropathy is not bad enough for every day".  He also is uncertain his balance is solely attributable to his neuropathy vs something going on in his head with PT providing some education on contribution and connections of this to his ongoing issues during session.  Will continue per POC, but may consider early discharge if pt fall risk is reduced upon assessment.  OBJECTIVE IMPAIRMENTS: Abnormal gait, decreased activity tolerance, decreased balance, decreased mobility, difficulty walking, decreased strength, impaired flexibility, impaired sensation, and pain.   ACTIVITY LIMITATIONS: transfers and locomotion level  PARTICIPATION LIMITATIONS: driving, shopping, community activity, yard work, and church  PERSONAL FACTORS: Fitness, Past/current experiences, and Time since onset of injury/illness/exacerbation are also affecting patient's functional outcome.   REHAB POTENTIAL: Excellent  CLINICAL DECISION MAKING: Evolving/moderate  complexity  EVALUATION COMPLEXITY: Moderate  PLAN:  PT FREQUENCY: 2x/week  PT DURATION: 6 weeks  PLANNED INTERVENTIONS: [id number]- PT Re-evaluation, 97110-Therapeutic exercises, 97530- Therapeutic activity, 97112- Neuromuscular re-education, 97535- Self Care, 40102- Manual therapy, [phone]- Gait training, [phone]- Aquatic Therapy, [phone]- Ionotophoresis 4mg /ml Dexamethasone, Patient/Family education, Balance training, Stair training, Taping, Dry Needling, and Vestibular training  PLAN FOR NEXT SESSION: BLE and hip strengthening, balance strategies - SLS, compliant surfaces, EC, work on sit <> stands with no UE support, work on rockerboard, reactive stepping.   Perhaps consider early discharge plan?  FGA assessment?  Camille Bal, PT,  DPT 05/26/23 9:34 AM

## 2023-05-29 ENCOUNTER — Encounter: Payer: Self-pay | Admitting: Physical Therapy

## 2023-05-29 ENCOUNTER — Ambulatory Visit: Payer: Medicare Other | Attending: Nurse Practitioner | Admitting: Physical Therapy

## 2023-05-29 DIAGNOSIS — R29898 Other symptoms and signs involving the musculoskeletal system: Secondary | ICD-10-CM | POA: Diagnosis present

## 2023-05-29 DIAGNOSIS — R208 Other disturbances of skin sensation: Secondary | ICD-10-CM | POA: Insufficient documentation

## 2023-05-29 DIAGNOSIS — M6281 Muscle weakness (generalized): Secondary | ICD-10-CM | POA: Diagnosis present

## 2023-05-29 DIAGNOSIS — R262 Difficulty in walking, not elsewhere classified: Secondary | ICD-10-CM | POA: Insufficient documentation

## 2023-05-29 DIAGNOSIS — R2681 Unsteadiness on feet: Secondary | ICD-10-CM | POA: Insufficient documentation

## 2023-05-29 NOTE — Therapy (Signed)
 OUTPATIENT PHYSICAL THERAPY NEURO TREATMENT   Patient Name: JAWANZA ZAMBITO MRN: [id number] DOB:01-22-1956, 68 y.o., male Today's Date: 05/29/2023   PCP: Oley Bascom RAMAN, NP REFERRING PROVIDER: Oley Bascom RAMAN, NP  END OF SESSION:  PT End of Session - 05/29/23 0848     Visit Number 7    Number of Visits 13    Date for PT Re-Evaluation 06/09/23    Authorization Type M[employer] and [employer]    Authorization Time Period 04/28/23 to 06/23/23    Progress Note Due on Visit 10    PT Start Time 0847    PT Stop Time 0927    PT Time Calculation (min) 40 min    Equipment Utilized During Treatment Gait belt    Activity Tolerance Patient tolerated treatment well    Behavior During Therapy [facility] for tasks assessed/performed             Past Medical History:  Diagnosis Date   Arthritis    right knee (09/04/2015)   Atrial flutter with rapid ventricular response (HCC) 04/19/2019   Dizziness 10/2019   Dyspnea    increased exertion   Elevated serum creatinine 07/2020   HTN (hypertension)    Hyperlipidemia    Kidney disease    stage 3B   Lung cancer (HCC) 05/2019   Microalbuminuria 07/2020   NSTEMI (non-ST elevated myocardial infarction) (HCC) 09/04/2015   a. cath 09/05/2015: 95% stenosis mid-LAD, 55% mid-RCA, and 40% prox-RCA. PCI performed w/ Synergy DES to LAD   PAD (peripheral artery disease) (HCC)    Stenting of bilateral iliacs in 2003.   Refusal of blood transfusions as patient is Jehov[employer]'s Witness    Type II diabetes mellitus (HCC)    Vitamin D  deficiency 07/2020   Past Surgical History:  Procedure Laterality Date   CARDIAC CATHETERIZATION N/A 09/05/2015   Procedure: Left Heart Cath and Coronary Angiography;  Surgeon: Debby DELENA Sor, MD;  Location: [facility] INVASIVE CV LAB;  Service: Cardiovascular;  Laterality: N/A;   CARDIAC CATHETERIZATION N/A 09/05/2015   Procedure: Coronary Stent Intervention;  Surgeon: Debby DELENA Sor, MD;  Location: [facility] INVASIVE CV LAB;  Service:  Cardiovascular;  Laterality: N/A;   IR IMAGING GUIDED PORT INSERTION  07/15/2019   ORIF CONGENITAL HIP DISLOCATION Left ~ 1965   put pins in it   PERIPHERAL VASCULAR CATHETERIZATION Bilateral 2003   stenting   TONSILLECTOMY  ~ 1963   Patient Active Problem List   Diagnosis Date Noted   Upper respiratory tract infection 07/11/2022   Iron deficiency anemia 03/28/2022   Need for immunization against influenza 03/08/2022   Chemotherapy-induced neuropathy (HCC) 05/31/2021   Drug-induced neutropenia (HCC) 12/26/2020   Port-A-Cath in place 12/29/2019   Metastatic non-small cell lung cancer (HCC) 07/07/2019   Encounter for antineoplastic chemotherapy 07/07/2019   Encounter for antineoplastic immunotherapy 07/07/2019   Goals of care, counseling/discussion 06/28/2019   Adenocarcinoma of right lung, stage 4 (HCC) 06/28/2019   Cancer associated pain 06/28/2019   CAD (coronary artery disease) 06/08/2019   Mediastinal lymphadenopathy 06/03/2019   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) 06/03/2019   Hemoglobin A1C between 7% and 9% indicating borderline diabetic control (HCC) 05/10/2019   COVID-19 virus infection 05/10/2019   Atrial flutter with rapid ventricular response (HCC) 04/19/2019   Chest pain with moderate risk for cardiac etiology 04/19/2019   Paroxysmal atrial flutter (HCC) 04/19/2019   Chest congestion 07/10/2018   Cough 07/10/2018   NSTEMI (non-ST elevated myocardial infarction) (HCC) 09/04/2015   PVD (  peripheral vascular disease) (HCC) 06/08/2015   DDD (degenerative disc disease), cervical 06/08/2015   Hyperlipidemia 06/08/2015   Diabetes mellitus with nephropathy (HCC) 06/08/2015   Diabetes mellitus with peripheral vascular disease (HCC) 06/08/2015   Annual physical exam 01/03/2015   PAD (peripheral artery disease) (HCC) 02/18/2013   Diabetes (HCC) 02/18/2013   HTN (hypertension) 02/18/2013    ONSET DATE: a couple years ago   REFERRING DIAG: Diagnosis R26.81  (ICD-10-CM) - Unsteady gait  THERAPY DIAG:  Unsteadiness on feet  Muscle weakness (generalized)  Difficulty in walking, not elsewhere classified  Rationale for Evaluation and Treatment: Rehabilitation  SUBJECTIVE:                                                                                                                                                                                             SUBJECTIVE STATEMENT:  Reports he was pretty sore after doing the lunges. Soreness started easing off yesterday. Legs are feeling fine today. Still not doing his exercises. Feels like some of his balance is coming from his head, will be standing still and then will just feel like he is tilting over.   Pt accompanied by: self  PERTINENT HISTORY: see above   PAIN:  Are you having pain? No  There were no vitals filed for this visit.   PRECAUTIONS: Fall  RED FLAGS: None   WEIGHT BEARING RESTRICTIONS: No  FALLS: Has patient fallen in last 6 months? No; no FOF  LIVING ENVIRONMENT: Lives with: lives with their spouse Lives in: House/apartment Stairs:  couple of steps going into the home, no rails but not a big deal  Has following equipment at home: None  PLOF: Independent, Independent with basic ADLs, Independent with gait, and Independent with transfers  PATIENT GOALS: not sure, was sent here by MD   On 12/9, pt states he wants his goal to be more stable   OBJECTIVE:  Note: Objective measures were completed at Evaluation unless otherwise noted.  DIAGNOSTIC FINDINGS:   COGNITION: Overall cognitive status: Within functional limits for tasks assessed   SENSATION: Hx of neuropathy after chemo   LOWER EXTREMITY MMT:    MMT Right Eval Left Eval  Hip flexion 3+ 3+  Hip extension 3 3  Hip abduction 3+ 3+  Hip adduction    Hip internal rotation    Hip external rotation    Knee flexion 4+ 4-  Knee extension 5 4+  Ankle dorsiflexion 5 4+  Ankle plantarflexion     Ankle inversion    Ankle eversion    (Blank rows = not tested)    STAIRS: Level of Assistance:  Complete Independence Stair Negotiation Technique: Step to Pattern with No Rails Number of Stairs: 4  Height of Stairs: 6 inch   Comments: moderate unsteadiness with descent   GAIT: Gait pattern: step through pattern, decreased arm swing- Right, decreased arm swing- Left, decreased ankle dorsiflexion- Right, decreased ankle dorsiflexion- Left, decreased trunk rotation, and trunk flexed Distance walked: 520ft  Assistive device utilized: None Level of assistance: Complete Independence Comments: increased hip fatigue, some hip pain with gait, + trendelenburg, R toe out with gait   FUNCTIONAL TESTS:  3 minute walk test: 565ft  Dynamic Gait Index: 17/24 VOR reflex intact, VOR cancellation OK   PATIENT SURVEYS:  Not in FOTO system at eval   TODAY'S TREATMENT:                                                                                                                              DATE: 05/29/23  Therapeutic Activity:  [facility] PT Assessment - 05/29/23 0853       Standardized Balance Assessment   Standardized Balance Assessment Dynamic Gait Index      Dynamic Gait Index   Level Surface Mild Impairment    Change in Gait Speed Normal    Gait with Horizontal Head Turns Mild Impairment    Gait with Vertical Head Turns Normal    Gait and Pivot Turn Normal    Step Over Obstacle Normal    Step Around Obstacles Normal    Steps Normal    Total Score 22      Functional Gait  Assessment   Gait assessed  Yes    Gait Level Surface Walks 20 ft, slow speed, abnormal gait pattern, evidence for imbalance or deviates 10-15 in outside of the 12 in walkway width. Requires more than 7 sec to ambulate 20 ft.   7.5 seconds   Change in Gait Speed Able to smoothly change walking speed without loss of balance or gait deviation. Deviate no more than 6 in outside of the 12 in walkway width.    Gait with  Horizontal Head Turns Performs head turns smoothly with slight change in gait velocity (eg, minor disruption to smooth gait path), deviates 6-10 in outside 12 in walkway width, or uses an assistive device.    Gait with Vertical Head Turns Performs head turns with no change in gait. Deviates no more than 6 in outside 12 in walkway width.    Gait and Pivot Turn Pivot turns safely within 3 sec and stops quickly with no loss of balance.    Step Over Obstacle Is able to step over 2 stacked shoe boxes taped together (9 in total height) without changing gait speed. No evidence of imbalance.    Gait with Narrow Base of Support Ambulates 4-7 steps.   5 steps   Gait with Eyes Closed Walks 20 ft, uses assistive device, slower speed, mild gait deviations, deviates 6-10 in outside 12 in walkway width. Ambulates 20 ft in  less than 9 sec but greater than 7 sec.    Ambulating Backwards Walks 20 ft, no assistive devices, good speed, no evidence for imbalance, normal gait    Steps Alternating feet, no rail.    Total Score 24    FGA comment: 24/30 = Medium Fall Risk            NMR: On blue foam beam: Slow tandem gait down and back x4 reps, trying to use minimal UE support  With 2 cones: alternating forward taps 12 reps each leg, trying to decr UE support  Alternating forward stepping with head turn to R/L 10 reps each side, repeated but with alternating backwards step 10 reps each side, pt reporting easier time stepping backwards  On blue mat: Forward/retro walking with EC down and back x4 reps, pt unsteady with this, needing frequent taps to bars for balance  Feet hip width > together EC 3 x 30 seconds  Sit <> stands holding 10# medicine ball with no UE support 10 reps, an additional 10 reps with raising up onto toes immediately in standing 10 reps   PATIENT EDUCATION: Education details: Educated on purpose of vestibular system and importance of doing these for HEP, results of FGA/DGI Person educated:  Patient Education method: Explanation Education comprehension: verbalized understanding  HOME EXERCISE PROGRAM: Access Code: [email or web address] URL: [email or web address] Date: 05/05/2023 Prepared by: Sheffield Senate  Exercises - Supine Figure 4 Piriformis Stretch  - 1 x daily - 7 x weekly - 1 sets - 4 reps - 30 seconds  hold - Seated Figure 4 Piriformis Stretch  - 1 x daily - 7 x weekly - 1 sets - 4 reps - 30 seconds  hold - Standing Hip Abduction with Resistance at Thighs  - 1 x daily - 7 x weekly - 2 sets - 10 reps - 1 second hold - Supine Bridge with Resistance Band  - 1 x daily - 7 x weekly - 2 sets - 10 reps - 2 seconds  hold - Tandem Stance in Corner  - 1 x daily - 7 x weekly - 1 sets - 4 reps - 30 seconds  hold - Standing Balance with Eyes Closed on Foam  - 1 x daily - 5 x weekly - 3 sets - 20-30 hold - Standing with Head Rotation  - 1 x daily - 5 x weekly - 2 sets - 10 reps and nods, on unlevel surface   GOALS: Goals reviewed with patient? No  SHORT TERM GOALS: Target date: 05/19/2023    Will be compliant with appropriate progressive HEP  Baseline:  Pt no compliant (12/30) Goal status: NOT MET  2.  Piriformis muscle flexibility to have improved as evidenced by resolution of toe out R LE with gait  Baseline: Bilateral toe out (12/30) Goal status: NOT MET  3.  Hip pain to have resolved  Baseline: Intermittent right hip stiffness, none at current (12/30) Goal status: MET  4. Will be able to verbalize importance of regular foot/skin checks given hx of DM and chemo induced neuropathy Baseline: once a week (12/30) Goal status: ONGOING  5. Pt will improve condition 3 of mCTSIB to at least 30 seconds in order to demo improved balance on compliant surfaces.  Baseline: 20 seconds; 30 seconds (12/30) Goal status: MET    LONG TERM GOALS: Target date: 06/09/2023    MMT to improve by at least 1 grade in all weak groups Baseline:  Goal status: INITIAL  2.  Will  score at least 21/24 on DGI to show improved functional balance  Baseline: 22/24  Goal status: MET  3.  Will be able to ascend/descend steps without significant unsteadiness especially with stair descent  Baseline:  Goal status: INITIAL  4.  Will be able to walk unlimited distances in the community without hip pain or unsteadiness in order to increase activity levels  Baseline:  Goal status: INITIAL  5. Pt will improve condition 4 of mCTSIB to at least 15 seconds in order to demo improved vestibular system for balance.  Baseline: 3 seconds  Goal status: INITIAL   ASSESSMENT:  CLINICAL IMPRESSION: Assessed DGI at start of session with pt scoring a 22/24 and met LTG #2, indicating a decr fall risk. Also assessed FGA with pt scoring a 24/30, indicating a medium fall risk. Pt most challenged by tandem gait and balance with EC. Remainder of session focused on balance with vestibular input. Pt very challenged balance with gait with EC on a compliant surface. Educated to focus on balance with EC at home for HEP as pt currently not performing at home. Will continue per POC.   OBJECTIVE IMPAIRMENTS: Abnormal gait, decreased activity tolerance, decreased balance, decreased mobility, difficulty walking, decreased strength, impaired flexibility, impaired sensation, and pain.   ACTIVITY LIMITATIONS: transfers and locomotion level  PARTICIPATION LIMITATIONS: driving, shopping, community activity, yard work, and church  PERSONAL FACTORS: Fitness, Past/current experiences, and Time since onset of injury/illness/exacerbation are also affecting patient's functional outcome.   REHAB POTENTIAL: Excellent  CLINICAL DECISION MAKING: Evolving/moderate complexity  EVALUATION COMPLEXITY: Moderate  PLAN:  PT FREQUENCY: 2x/week  PT DURATION: 6 weeks  PLANNED INTERVENTIONS: 97164- PT Re-evaluation, 97110-Therapeutic exercises, 97530- Therapeutic activity, 97112- Neuromuscular re-education, 97535- Self  Care, [id number]- Manual therapy, [phone]- Gait training, [phone]- Aquatic Therapy, [phone]- Ionotophoresis 4mg /ml Dexamethasone , Patient/Family education, Balance training, Stair training, Taping, Dry Needling, and Vestibular training  PLAN FOR NEXT SESSION: BLE and hip strengthening, balance strategies - SLS, compliant surfaces, EC, work on sit <> stands with no UE support, work on rockerboard, reactive stepping. Work on valance with vestibular input   Maybe D/C at end of this week?   Sheffield Senate, PT, DPT 05/29/23 9:28 AM

## 2023-06-02 ENCOUNTER — Ambulatory Visit: Payer: Medicare Other | Admitting: Physical Therapy

## 2023-06-04 ENCOUNTER — Ambulatory Visit: Payer: Medicare Other | Admitting: Physical Therapy

## 2023-06-04 ENCOUNTER — Encounter: Payer: Self-pay | Admitting: Physical Therapy

## 2023-06-04 DIAGNOSIS — R2681 Unsteadiness on feet: Secondary | ICD-10-CM

## 2023-06-04 DIAGNOSIS — R262 Difficulty in walking, not elsewhere classified: Secondary | ICD-10-CM

## 2023-06-04 DIAGNOSIS — M6281 Muscle weakness (generalized): Secondary | ICD-10-CM

## 2023-06-04 NOTE — Therapy (Signed)
 OUTPATIENT PHYSICAL THERAPY NEURO TREATMENT   Patient Name: Kirk [name] MRN: [id number] DOB:24-Sep-1955, 68 y.o., male Today's Date: 06/04/2023   PCP: Oley Bascom RAMAN, NP REFERRING PROVIDER: Oley Bascom RAMAN, NP  END OF SESSION:  PT End of Session - 06/04/23 0849     Visit Number 8    Number of Visits 13    Date for PT Re-Evaluation 06/09/23    Authorization Type M[employer] and [employer]    Authorization Time Period 04/28/23 to 06/23/23    Progress Note Due on Visit 10    PT Start Time 0847    PT Stop Time 0927    PT Time Calculation (min) 40 min    Equipment Utilized During Treatment Gait belt    Activity Tolerance Patient tolerated treatment well    Behavior During Therapy [facility] for tasks assessed/performed             Past Medical History:  Diagnosis Date   Arthritis    right knee (09/04/2015)   Atrial flutter with rapid ventricular response (HCC) 04/19/2019   Dizziness 10/2019   Dyspnea    increased exertion   Elevated serum creatinine 07/2020   HTN (hypertension)    Hyperlipidemia    Kidney disease    stage 3B   Lung cancer (HCC) 05/2019   Microalbuminuria 07/2020   NSTEMI (non-ST elevated myocardial infarction) (HCC) 09/04/2015   a. cath 09/05/2015: 95% stenosis mid-LAD, 55% mid-RCA, and 40% prox-RCA. PCI performed w/ Synergy DES to LAD   PAD (peripheral artery disease) (HCC)    Stenting of bilateral iliacs in 2003.   Refusal of blood transfusions as patient is Jehov[employer]'s Witness    Type II diabetes mellitus (HCC)    Vitamin D  deficiency 07/2020   Past Surgical History:  Procedure Laterality Date   CARDIAC CATHETERIZATION N/A 09/05/2015   Procedure: Left Heart Cath and Coronary Angiography;  Surgeon: Debby DELENA Sor, MD;  Location: [facility] INVASIVE CV LAB;  Service: Cardiovascular;  Laterality: N/A;   CARDIAC CATHETERIZATION N/A 09/05/2015   Procedure: Coronary Stent Intervention;  Surgeon: Debby DELENA Sor, MD;  Location: [facility] INVASIVE CV LAB;  Service:  Cardiovascular;  Laterality: N/A;   IR IMAGING GUIDED PORT INSERTION  07/15/2019   ORIF CONGENITAL HIP DISLOCATION Left ~ 1965   put pins in it   PERIPHERAL VASCULAR CATHETERIZATION Bilateral 2003   stenting   TONSILLECTOMY  ~ 1963   Patient Active Problem List   Diagnosis Date Noted   Upper respiratory tract infection 07/11/2022   Iron deficiency anemia 03/28/2022   Need for immunization against influenza 03/08/2022   Chemotherapy-induced neuropathy (HCC) 05/31/2021   Drug-induced neutropenia (HCC) 12/26/2020   Port-A-Cath in place 12/29/2019   Metastatic non-small cell lung cancer (HCC) 07/07/2019   Encounter for antineoplastic chemotherapy 07/07/2019   Encounter for antineoplastic immunotherapy 07/07/2019   Goals of care, counseling/discussion 06/28/2019   Adenocarcinoma of right lung, stage 4 (HCC) 06/28/2019   Cancer associated pain 06/28/2019   CAD (coronary artery disease) 06/08/2019   Mediastinal lymphadenopathy 06/03/2019   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) 06/03/2019   Hemoglobin A1C between 7% and 9% indicating borderline diabetic control (HCC) 05/10/2019   COVID-19 virus infection 05/10/2019   Atrial flutter with rapid ventricular response (HCC) 04/19/2019   Chest pain with moderate risk for cardiac etiology 04/19/2019   Paroxysmal atrial flutter (HCC) 04/19/2019   Chest congestion 07/10/2018   Cough 07/10/2018   NSTEMI (non-ST elevated myocardial infarction) (HCC) 09/04/2015   PVD (  peripheral vascular disease) (HCC) 06/08/2015   DDD (degenerative disc disease), cervical 06/08/2015   Hyperlipidemia 06/08/2015   Diabetes mellitus with nephropathy (HCC) 06/08/2015   Diabetes mellitus with peripheral vascular disease (HCC) 06/08/2015   Annual physical exam 01/03/2015   PAD (peripheral artery disease) (HCC) 02/18/2013   Diabetes (HCC) 02/18/2013   HTN (hypertension) 02/18/2013    ONSET DATE: a couple years ago   REFERRING DIAG: Diagnosis R26.81  (ICD-10-CM) - Unsteady gait  THERAPY DIAG:  Unsteadiness on feet  Muscle weakness (generalized)  Difficulty in walking, not elsewhere classified  Rationale for Evaluation and Treatment: Rehabilitation  SUBJECTIVE:                                                                                                                                                                                             SUBJECTIVE STATEMENT:  Reports no falls. No changes. Notes balance is the same. Wants to keep working on balance in the bars.   Pt accompanied by: self  PERTINENT HISTORY: see above   PAIN:  Are you having pain? No  There were no vitals filed for this visit.   PRECAUTIONS: Fall  RED FLAGS: None   WEIGHT BEARING RESTRICTIONS: No  FALLS: Has patient fallen in last 6 months? No; no FOF  LIVING ENVIRONMENT: Lives with: lives with their spouse Lives in: House/apartment Stairs:  couple of steps going into the home, no rails but not a big deal  Has following equipment at home: None  PLOF: Independent, Independent with basic ADLs, Independent with gait, and Independent with transfers  PATIENT GOALS: not sure, was sent here by MD   On 12/9, pt states he wants his goal to be more stable   OBJECTIVE:  Note: Objective measures were completed at Evaluation unless otherwise noted.  DIAGNOSTIC FINDINGS:   COGNITION: Overall cognitive status: Within functional limits for tasks assessed   SENSATION: Hx of neuropathy after chemo   LOWER EXTREMITY MMT:    MMT Right Eval Left Eval  Hip flexion 3+ 3+  Hip extension 3 3  Hip abduction 3+ 3+  Hip adduction    Hip internal rotation    Hip external rotation    Knee flexion 4+ 4-  Knee extension 5 4+  Ankle dorsiflexion 5 4+  Ankle plantarflexion    Ankle inversion    Ankle eversion    (Blank rows = not tested)    STAIRS: Level of Assistance: Complete Independence Stair Negotiation Technique: Step to Pattern with  No Rails Number of Stairs: 4  Height of Stairs: 6 inch   Comments: moderate unsteadiness with descent  GAIT: Gait pattern: step through pattern, decreased arm swing- Right, decreased arm swing- Left, decreased ankle dorsiflexion- Right, decreased ankle dorsiflexion- Left, decreased trunk rotation, and trunk flexed Distance walked: 564ft  Assistive device utilized: None Level of assistance: Complete Independence Comments: increased hip fatigue, some hip pain with gait, + trendelenburg, R toe out with gait   FUNCTIONAL TESTS:  3 minute walk test: 523ft  Dynamic Gait Index: 17/24 VOR reflex intact, VOR cancellation OK   PATIENT SURVEYS:  Not in FOTO system at eval   TODAY'S TREATMENT:                                                                                                                              DATE: 06/04/23   Therapeutic Exercise:  SciFit with BUE/BLE Multi-peaks at gear 5.5 for 8 minutes for strengthening, aerobic activity, endurance/activity tolerance. Pt reporting RPE at 5-6/10  NMR: With 5 blaze pods in a line, working on side stepping, SLS, weight shifting, standing on blue foam beam, trying to perform without UE support, pt frequently needing to tap bars for balance:  Performed 3 bouts of 1 minute: 12 hits, 15 hits, 12 hits  On air ex: Wide BOS EC 3 x 30 seconds, needing to use intermittent UE support for balance  3/4 tandem with ball toss with resident 2 x 10 reps, pt more challenged with LLE posteriorly  On level ground: With feet slightly narrow BOS > together EC head turns 2 x 10 reps, head nods 2 x 10 reps, pt more challenged with head nods  Tandem stance 30 seconds bilat  On rockerboard in A/P direction: Alternating SLS taps to 2 cones, 12 reps each leg, needing to use fingertip support  Self ball toss for perturbations 15 reps, pt needing to grab on intermittently to bars or needing to take a step for balance on and off board  PATIENT  EDUCATION: Education details: try working on HEP, plan for D/C at next session.  Person educated: Patient Education method: Explanation Education comprehension: verbalized understanding  HOME EXERCISE PROGRAM: Access Code: [email or web address] URL: [email or web address] Date: 05/05/2023 Prepared by: Sheffield Senate  Exercises - Supine Figure 4 Piriformis Stretch  - 1 x daily - 7 x weekly - 1 sets - 4 reps - 30 seconds  hold - Seated Figure 4 Piriformis Stretch  - 1 x daily - 7 x weekly - 1 sets - 4 reps - 30 seconds  hold - Standing Hip Abduction with Resistance at Thighs  - 1 x daily - 7 x weekly - 2 sets - 10 reps - 1 second hold - Supine Bridge with Resistance Band  - 1 x daily - 7 x weekly - 2 sets - 10 reps - 2 seconds  hold - Tandem Stance in Corner  - 1 x daily - 7 x weekly - 1 sets - 4 reps - 30 seconds  hold - Standing Balance with Eyes Closed on Foam  -  1 x daily - 5 x weekly - 3 sets - 20-30 hold - Standing with Head Rotation  - 1 x daily - 5 x weekly - 2 sets - 10 reps and nods, on unlevel surface   GOALS: Goals reviewed with patient? No  SHORT TERM GOALS: Target date: 05/19/2023    Will be compliant with appropriate progressive HEP  Baseline:  Pt no compliant (12/30) Goal status: NOT MET  2.  Piriformis muscle flexibility to have improved as evidenced by resolution of toe out R LE with gait  Baseline: Bilateral toe out (12/30) Goal status: NOT MET  3.  Hip pain to have resolved  Baseline: Intermittent right hip stiffness, none at current (12/30) Goal status: MET  4. Will be able to verbalize importance of regular foot/skin checks given hx of DM and chemo induced neuropathy Baseline: once a week (12/30) Goal status: ONGOING  5. Pt will improve condition 3 of mCTSIB to at least 30 seconds in order to demo improved balance on compliant surfaces.  Baseline: 20 seconds; 30 seconds (12/30) Goal status: MET    LONG TERM GOALS: Target date: 06/09/2023    MMT  to improve by at least 1 grade in all weak groups Baseline:  Goal status: INITIAL  2.  Will score at least 21/24 on DGI to show improved functional balance  Baseline: 22/24  Goal status: MET  3.  Will be able to ascend/descend steps without significant unsteadiness especially with stair descent  Baseline:  Goal status: INITIAL  4.  Will be able to walk unlimited distances in the community without hip pain or unsteadiness in order to increase activity levels  Baseline:  Goal status: INITIAL  5. Pt will improve condition 4 of mCTSIB to at least 15 seconds in order to demo improved vestibular system for balance.  Baseline: 3 seconds  Goal status: INITIAL   ASSESSMENT:  CLINICAL IMPRESSION: Today's skilled session continued to focus on BLE strengthening and balance strategies with narrow BOS, EC, unlevel surfaces, and SLS. Pt most challenged by [facility] tasks and tandem tasks when LLE is posterior. Continued to encourage pt to perform HEP at home to address balance as pt currently not performing. Discussed plan for D/C at next session with pt in agreement with plan. Will continue per POC.   OBJECTIVE IMPAIRMENTS: Abnormal gait, decreased activity tolerance, decreased balance, decreased mobility, difficulty walking, decreased strength, impaired flexibility, impaired sensation, and pain.   ACTIVITY LIMITATIONS: transfers and locomotion level  PARTICIPATION LIMITATIONS: driving, shopping, community activity, yard work, and church  PERSONAL FACTORS: Fitness, Past/current experiences, and Time since onset of injury/illness/exacerbation are also affecting patient's functional outcome.   REHAB POTENTIAL: Excellent  CLINICAL DECISION MAKING: Evolving/moderate complexity  EVALUATION COMPLEXITY: Moderate  PLAN:  PT FREQUENCY: 2x/week  PT DURATION: 6 weeks  PLANNED INTERVENTIONS: [id number]- PT Re-evaluation, 97110-Therapeutic exercises, 97530- Therapeutic activity, 97112- Neuromuscular  re-education, 97535- Self Care, [id number]- Manual therapy, [phone]- Gait training, [phone]- Aquatic Therapy, [phone]- Ionotophoresis 4mg /ml Dexamethasone , Patient/Family education, Balance training, Stair training, Taping, Dry Needling, and Vestibular training  PLAN FOR NEXT SESSION: FINISH CHECKING GOALS AND PLAN TO D/C ON MONDAY THE 13TH (this is pt's last appt in cert)  Sheffield Senate, PT, DPT 06/04/23 9:32 AM

## 2023-06-09 ENCOUNTER — Ambulatory Visit: Payer: Medicare Other | Admitting: Physical Therapy

## 2023-06-09 ENCOUNTER — Encounter: Payer: Self-pay | Admitting: Physical Therapy

## 2023-06-09 VITALS — BP 133/74 | HR 77

## 2023-06-09 DIAGNOSIS — R29898 Other symptoms and signs involving the musculoskeletal system: Secondary | ICD-10-CM

## 2023-06-09 DIAGNOSIS — R262 Difficulty in walking, not elsewhere classified: Secondary | ICD-10-CM

## 2023-06-09 DIAGNOSIS — M6281 Muscle weakness (generalized): Secondary | ICD-10-CM

## 2023-06-09 DIAGNOSIS — R2681 Unsteadiness on feet: Secondary | ICD-10-CM | POA: Diagnosis not present

## 2023-06-09 DIAGNOSIS — R208 Other disturbances of skin sensation: Secondary | ICD-10-CM

## 2023-06-09 NOTE — Therapy (Signed)
 OUTPATIENT PHYSICAL THERAPY NEURO TREATMENT - DISCHARGE SUMMARY   Patient Name: Kirk [name] MRN: [id number] DOB:11/26/1955, 68 y.o., male Today's Date: 06/09/2023   PCP: Oley Bascom RAMAN, NP REFERRING PROVIDER: Oley Bascom RAMAN, NP  PHYSICAL THERAPY DISCHARGE SUMMARY  Visits from Start of Care: 9  Current functional level related to goals / functional outcomes: See clinical impression statement.   Remaining deficits: Mild fall risk and difficulty in visually limited conditions   Education / Equipment: Plan for discharge today and progress towards goals.  Process to obtain referral as needed in future.  Continue HEP.  Discussed condition 4 of mCTSIB relevance to things pt struggles with like praying in church and how to continue practicing at home.  Educated on 3 balance systems and target of condition 4.   Patient agrees to discharge. Patient goals were partially met. Patient is being discharged due to maximized rehab potential.    END OF SESSION:  PT End of Session - 06/09/23 0852     Visit Number 9    Number of Visits 13    Date for PT Re-Evaluation 06/09/23    Authorization Type M[employer] and [employer]    Authorization Time Period 04/28/23 to 06/23/23    Progress Note Due on Visit 10    PT Start Time 0848    PT Stop Time 0930    PT Time Calculation (min) 42 min    Equipment Utilized During Treatment Gait belt    Activity Tolerance Patient tolerated treatment well    Behavior During Therapy WFL for tasks assessed/performed             Past Medical History:  Diagnosis Date   Arthritis    right knee (09/04/2015)   Atrial flutter with rapid ventricular response (HCC) 04/19/2019   Dizziness 10/2019   Dyspnea    increased exertion   Elevated serum creatinine 07/2020   HTN (hypertension)    Hyperlipidemia    Kidney disease    stage 3B   Lung cancer (HCC) 05/2019   Microalbuminuria 07/2020   NSTEMI (non-ST elevated myocardial infarction) (HCC) 09/04/2015    a. cath 09/05/2015: 95% stenosis mid-LAD, 55% mid-RCA, and 40% prox-RCA. PCI performed w/ Synergy DES to LAD   PAD (peripheral artery disease) (HCC)    Stenting of bilateral iliacs in 2003.   Refusal of blood transfusions as patient is Jehovah's Witness    Type II diabetes mellitus (HCC)    Vitamin D  deficiency 07/2020   Past Surgical History:  Procedure Laterality Date   CARDIAC CATHETERIZATION N/A 09/05/2015   Procedure: Left Heart Cath and Coronary Angiography;  Surgeon: Debby DELENA Sor, MD;  Location: [facility];  Service: Cardiovascular;  Laterality: N/A;   CARDIAC CATHETERIZATION N/A 09/05/2015   Procedure: Coronary Stent Intervention;  Surgeon: Debby DELENA Sor, MD;  Location: [facility];  Service: Cardiovascular;  Laterality: N/A;   IR IMAGING GUIDED PORT INSERTION  07/15/2019   ORIF CONGENITAL HIP DISLOCATION Left ~ 1965   put pins in it   PERIPHERAL VASCULAR CATHETERIZATION Bilateral 2003   stenting   TONSILLECTOMY  ~ 1963   Patient Active Problem List   Diagnosis Date Noted   Upper respiratory tract infection 07/11/2022   Iron deficiency anemia 03/28/2022   Need for immunization against influenza 03/08/2022   Chemotherapy-induced neuropathy (HCC) 05/31/2021   Drug-induced neutropenia (HCC) 12/26/2020   Port-A-Cath in place 12/29/2019   Metastatic non-small cell lung cancer (HCC) 07/07/2019   Encounter for  antineoplastic chemotherapy 07/07/2019   Encounter for antineoplastic immunotherapy 07/07/2019   Goals of care, counseling/discussion 06/28/2019   Adenocarcinoma of right lung, stage 4 (HCC) 06/28/2019   Cancer associated pain 06/28/2019   CAD (coronary artery disease) 06/08/2019   Mediastinal lymphadenopathy 06/03/2019   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) 06/03/2019   Hemoglobin A1C between 7% and 9% indicating borderline diabetic control (HCC) 05/10/2019   COVID-19 virus infection 05/10/2019   Atrial flutter with rapid ventricular  response (HCC) 04/19/2019   Chest pain with moderate risk for cardiac etiology 04/19/2019   Paroxysmal atrial flutter (HCC) 04/19/2019   Chest congestion 07/10/2018   Cough 07/10/2018   NSTEMI (non-ST elevated myocardial infarction) (HCC) 09/04/2015   PVD (peripheral vascular disease) (HCC) 06/08/2015   DDD (degenerative disc disease), cervical 06/08/2015   Hyperlipidemia 06/08/2015   Diabetes mellitus with nephropathy (HCC) 06/08/2015   Diabetes mellitus with peripheral vascular disease (HCC) 06/08/2015   Annual physical exam 01/03/2015   PAD (peripheral artery disease) (HCC) 02/18/2013   Diabetes (HCC) 02/18/2013   HTN (hypertension) 02/18/2013    ONSET DATE: a couple years ago   REFERRING DIAG: Diagnosis R26.81 (ICD-10-CM) - Unsteady gait  THERAPY DIAG:  Unsteadiness on feet  Muscle weakness (generalized)  Difficulty in walking, not elsewhere classified  Other disturbances of skin sensation  Other symptoms and signs involving the musculoskeletal system  Rationale for Evaluation and Treatment: Rehabilitation  SUBJECTIVE:                                                                                                                                                                                             SUBJECTIVE STATEMENT:  Reports no falls.  No changes. He reports he has tweaked my back but is unsure what he did.   Pt accompanied by: self  PERTINENT HISTORY: see above   PAIN:  Are you having pain? No - back is only bothering him when he gets ready to stand or walks a good bit  Vitals:   06/09/23 0854  BP: 133/74  Pulse: 77     PRECAUTIONS: Fall  RED FLAGS: None   WEIGHT BEARING RESTRICTIONS: No  FALLS: Has patient fallen in last 6 months? No; no FOF  LIVING ENVIRONMENT: Lives with: lives with their spouse Lives in: House/apartment Stairs:  couple of steps going into the home, no rails but not a big deal  Has following equipment at home:  None  PLOF: Independent, Independent with basic ADLs, Independent with gait, and Independent with transfers  PATIENT GOALS: not sure, was sent here by MD   On 12/9, pt states he wants his  goal to be more stable   OBJECTIVE:  Note: Objective measures were completed at Evaluation unless otherwise noted.  DIAGNOSTIC FINDINGS:   COGNITION: Overall cognitive status: Within functional limits for tasks assessed   SENSATION: Hx of neuropathy after chemo   LOWER EXTREMITY MMT:    MMT Right Eval Left Eval  Hip flexion 3+ 3+  Hip extension 3 3  Hip abduction 3+ 3+  Hip adduction    Hip internal rotation    Hip external rotation    Knee flexion 4+ 4-  Knee extension 5 4+  Ankle dorsiflexion 5 4+  Ankle plantarflexion    Ankle inversion    Ankle eversion    (Blank rows = not tested)    STAIRS: Level of Assistance: Complete Independence Stair Negotiation Technique: Step to Pattern with No Rails Number of Stairs: 4  Height of Stairs: 6 inch   Comments: moderate unsteadiness with descent   GAIT: Gait pattern: step through pattern, decreased arm swing- Right, decreased arm swing- Left, decreased ankle dorsiflexion- Right, decreased ankle dorsiflexion- Left, decreased trunk rotation, and trunk flexed Distance walked: 553ft  Assistive device utilized: None Level of assistance: Complete Independence Comments: increased hip fatigue, some hip pain with gait, + trendelenburg, R toe out with gait   FUNCTIONAL TESTS:  3 minute walk test: 5107ft  Dynamic Gait Index: 17/24 VOR reflex intact, VOR cancellation OK   PATIENT SURVEYS:  Not in FOTO system at eval   TODAY'S TREATMENT:                                                                                                                              DATE: 06/09/23   Therapeutic Activity: LUE in sitting: Vitals:   06/09/23 0854  BP: 133/74  Pulse: 77  EVAL MMT Right Eval Left Eval  Hip flexion 3+ 3+  Hip extension 3 3   Hip abduction 3+ 3+  Hip adduction    Hip internal rotation    Hip external rotation    Knee flexion 4+ 4-  Knee extension 5 4+  Ankle dorsiflexion 5 4+  Ankle plantarflexion    Ankle inversion    Ankle eversion    TODAY MMT Right Eval Left Eval  Hip flexion 4 4-  Hip extension 3 3  Hip abduction 4 4  Hip adduction    Hip internal rotation    Hip external rotation    Knee flexion 4- 4  Knee extension 5 5  Ankle dorsiflexion 5 4+  Ankle plantarflexion    Ankle inversion    Ankle eversion     Re-assessed DGI:  [facility] PT Assessment - 06/09/23 0903       Dynamic Gait Index   Level Surface Normal    Change in Gait Speed Mild Impairment    Gait with Horizontal Head Turns Normal    Gait with Vertical Head Turns Mild Impairment    Gait and Pivot Turn Normal  Step Over Obstacle Normal    Step Around Obstacles Normal    Steps Mild Impairment    Total Score 21    DGI comment: 21/24            mCTSIB (average of 3 trials):  2.22 sec, 3.30 sec, 2.90 sec = 2.81 sec  PATIENT EDUCATION: Education details: Plan for discharge today and progress towards goals.  Process to obtain referral as needed in future.  Continue HEP.  Discussed condition 4 of mCTSIB relevance to things pt struggles with like praying in church and how to continue practicing at home.  Educated on 3 balance systems and target of condition 4. Person educated: Patient Education method: Explanation Education comprehension: verbalized understanding  HOME EXERCISE PROGRAM: Access Code: [email or web address] URL: [email or web address] Date: 05/05/2023 Prepared by: Sheffield Senate  Exercises - Supine Figure 4 Piriformis Stretch  - 1 x daily - 7 x weekly - 1 sets - 4 reps - 30 seconds  hold - Seated Figure 4 Piriformis Stretch  - 1 x daily - 7 x weekly - 1 sets - 4 reps - 30 seconds  hold - Standing Hip Abduction with Resistance at Thighs  - 1 x daily - 7 x weekly - 2 sets - 10 reps - 1 second hold -  Supine Bridge with Resistance Band  - 1 x daily - 7 x weekly - 2 sets - 10 reps - 2 seconds  hold - Tandem Stance in Corner  - 1 x daily - 7 x weekly - 1 sets - 4 reps - 30 seconds  hold - Standing Balance with Eyes Closed on Foam  - 1 x daily - 5 x weekly - 3 sets - 20-30 hold - Standing with Head Rotation  - 1 x daily - 5 x weekly - 2 sets - 10 reps and nods, on unlevel surface   GOALS: Goals reviewed with patient? No  SHORT TERM GOALS: Target date: 05/19/2023    Will be compliant with appropriate progressive HEP  Baseline:  Pt no compliant (12/30) Goal status: NOT MET  2.  Piriformis muscle flexibility to have improved as evidenced by resolution of toe out R LE with gait  Baseline: Bilateral toe out (12/30) Goal status: NOT MET  3.  Hip pain to have resolved  Baseline: Intermittent right hip stiffness, none at current (12/30) Goal status: MET  4. Will be able to verbalize importance of regular foot/skin checks given hx of DM and chemo induced neuropathy Baseline: once a week (12/30) Goal status: ONGOING  5. Pt will improve condition 3 of mCTSIB to at least 30 seconds in order to demo improved balance on compliant surfaces.  Baseline: 20 seconds; 30 seconds (12/30) Goal status: MET    LONG TERM GOALS: Target date: 06/09/2023    MMT to improve by at least 1 grade in all weak groups Baseline:  MMT Right Eval Left Eval  Hip flexion 4 4-  Hip extension 3 3  Hip abduction 4 4  Hip adduction    Hip internal rotation    Hip external rotation    Knee flexion 4- 4  Knee extension 5 5  Ankle dorsiflexion 5 4+  Ankle plantarflexion    Ankle inversion    Ankle eversion    (1/13) Goal status: PARTIALLY MET  2.  Will score at least 21/24 on DGI to show improved functional balance  Baseline: 22/24; 21/24 (1/13) Goal status: MET  3.  Will be able to ascend/descend steps without  significant unsteadiness especially with stair descent  Baseline:  Pt stable with railing  (1/13) Goal status: MET  4.  Will be able to walk unlimited distances in the community without hip pain or unsteadiness in order to increase activity levels  Baseline: Pt reports no significant hip pain with walking so long as he does not wear a belt, walking dogs in neighborhood unassisted (1/13) Goal status: MET  5. Pt will improve condition 4 of mCTSIB to at least 15 seconds in order to demo improved vestibular system for balance.  Baseline: 3 seconds; 2.81 sec (1/13) Goal status: NOT MET   ASSESSMENT:  CLINICAL IMPRESSION: Completed LTG assessment this visit with pt making progress towards or meeting 4 of 5 goals as written.  He strength has improved in some areas and remained the same in others w/ an overall positive trend.  He remains unchanged on condition 4 of mCTSIB at roughly 3 seconds average.  He was given instructions of opportunities to practice this condition and other static balance conditions in order to see functional carryover into his weekly routine like praying at church.  His DGI score for the past 2 assessment have been consistently at goal level lowering his fall risk and demonstrating improved dynamic balance.  He reports greatly decreased hip pain and no real limitations to his community walking.  PT did recommend rail with stair management.  Will discharge at this time which pt is in agreement to.  OBJECTIVE IMPAIRMENTS: Abnormal gait, decreased activity tolerance, decreased balance, decreased mobility, difficulty walking, decreased strength, impaired flexibility, impaired sensation, and pain.   ACTIVITY LIMITATIONS: transfers and locomotion level  PARTICIPATION LIMITATIONS: driving, shopping, community activity, yard work, and church  PERSONAL FACTORS: Fitness, Past/current experiences, and Time since onset of injury/illness/exacerbation are also affecting patient's functional outcome.   REHAB POTENTIAL: Excellent  CLINICAL DECISION MAKING: Evolving/moderate  complexity  EVALUATION COMPLEXITY: Moderate  PLAN:  PT FREQUENCY: 2x/week  PT DURATION: 6 weeks  PLANNED INTERVENTIONS: 97164- PT Re-evaluation, 97110-Therapeutic exercises, 97530- Therapeutic activity, 97112- Neuromuscular re-education, 97535- Self Care, 02859- Manual therapy, [phone]- Gait training, [phone]- Aquatic Therapy, [phone]- Ionotophoresis 4mg /ml Dexamethasone , Patient/Family education, Balance training, Stair training, Taping, Dry Needling, and Vestibular training  PLAN FOR NEXT SESSION: N/A  Daved Bull, PT, DPT 06/09/23 9:43 AM

## 2023-06-11 ENCOUNTER — Ambulatory Visit: Payer: Medicare Other | Admitting: Physical Therapy

## 2023-06-12 ENCOUNTER — Telehealth: Payer: Medicare Other | Admitting: Physician Assistant

## 2023-06-12 DIAGNOSIS — J208 Acute bronchitis due to other specified organisms: Secondary | ICD-10-CM

## 2023-06-12 DIAGNOSIS — B9689 Other specified bacterial agents as the cause of diseases classified elsewhere: Secondary | ICD-10-CM | POA: Diagnosis not present

## 2023-06-12 MED ORDER — ALBUTEROL SULFATE HFA 108 (90 BASE) MCG/ACT IN AERS
2.0000 | INHALATION_SPRAY | Freq: Four times a day (QID) | RESPIRATORY_TRACT | 0 refills | Status: AC | PRN
Start: 2023-06-12 — End: ?

## 2023-06-12 MED ORDER — BENZONATATE 100 MG PO CAPS
100.0000 mg | ORAL_CAPSULE | Freq: Three times a day (TID) | ORAL | 0 refills | Status: DC | PRN
Start: 2023-06-12 — End: 2023-07-30

## 2023-06-12 MED ORDER — AZITHROMYCIN 250 MG PO TABS
ORAL_TABLET | ORAL | 0 refills | Status: AC
Start: 2023-06-12 — End: 2023-06-17

## 2023-06-12 NOTE — Patient Instructions (Signed)
Kirk [name], thank you for joining [name], PA-C for today's virtual visit.  While this provider is not your primary care provider (PCP), if your PCP is located in our provider database this encounter information will be shared with them immediately following your visit.   A [facility] MyChart account gives you access to today's visit and all your visits, tests, and labs performed at [facility] " click here if you don't have a [facility] MyChart account or go to [email or web address]  Consent: (Patient) Kirk [name] provided verbal consent for this virtual visit at the beginning of the encounter.  Current Medications:  Current Outpatient Medications:    acetaminophen (TYLENOL) 500 MG tablet, Take 500 mg by mouth every 6 (six) hours as needed for moderate pain or fever. , Disp: , Rfl:    aspirin EC 81 MG tablet, Take 81 mg by mouth at bedtime., Disp: , Rfl:    benzonatate (TESSALON) 200 MG capsule, Take 1 capsule (200 mg total) by mouth 2 (two) times daily as needed for cough., Disp: 20 capsule, Rfl: 0   Blood Glucose Monitoring Suppl (ACCU-CHEK GUIDE) w/Device KIT, USE UP TO FOUR TIMES DAILY AS DIRECTED. (FOR ICD-10 E10.9, E11.9)., Disp: 1 kit, Rfl: 0   carvedilol (COREG) 12.5 MG tablet, TAKE 1 TABLET (12.5MG  TOTAL) BY MOUTH TWICE A DAY WITH MEALS, Disp: 180 tablet, Rfl: 1   clopidogrel (PLAVIX) 75 MG tablet, TAKE 1 TABLET BY MOUTH EVERY DAY, Disp: 90 tablet, Rfl: 1   docusate sodium (COLACE) 100 MG capsule, TAKE 1 CAPSULE (100 MG TOTAL) BY MOUTH 2 (TWO) TIMES DAILY AS NEEDED FOR MILD CONSTIPATION., Disp: 180 capsule, Rfl: 3   ferrous sulfate 324 MG TBEC, Take 1 tablet (324 mg total) by mouth daily with breakfast., Disp: 30 tablet, Rfl: 11   gabapentin (NEURONTIN) 300 MG capsule, TAKE 1 CAPSULE BY MOUTH THREE TIMES A DAY, Disp: 270 capsule, Rfl: 3   glimepiride (AMARYL) 2 MG tablet, Take 1 tablet (2 mg total) by mouth daily with breakfast., Disp: 90 tablet,  Rfl: 2   Glucose Blood (BLOOD GLUCOSE TEST STRIPS) STRP, 1 each by In Vitro route 3 (three) times daily with meals. May substitute to any manufacturer covered by patient's insurance., Disp: 100 strip, Rfl: 3   JARDIANCE 25 MG TABS tablet, TAKE 1 TABLET BY MOUTH DAILY BEFORE BREAKFAST., Disp: 90 tablet, Rfl: 1   metFORMIN (GLUCOPHAGE-XR) 500 MG 24 hr tablet, Take 1 tablet (500 mg total) by mouth 2 (two) times daily with a meal., Disp: 180 tablet, Rfl: 3   Misc. Devices (ROLLATOR ULTRA-LIGHT) MISC, 1 each by Does not apply route daily., Disp: 1 each, Rfl: 0   nitroGLYCERIN (NITROSTAT) 0.4 MG SL tablet, Place 1 tablet (0.4 mg total) under the tongue every 5 (five) minutes x 3 doses as needed for chest pain., Disp: 25 tablet, Rfl: 2   promethazine-dextromethorphan (PROMETHAZINE-DM) 6.25-15 MG/5ML syrup, Take 5 mLs by mouth 4 (four) times daily as needed., Disp: 240 mL, Rfl: 0   rosuvastatin (CRESTOR) 40 MG tablet, TAKE 1 TABLET BY MOUTH EVERY DAY, Disp: 90 tablet, Rfl: 3   traZODone (DESYREL) 50 MG tablet, TAKE 0.5-1 TABLETS BY MOUTH AT BEDTIME AS NEEDED FOR SLEEP., Disp: 90 tablet, Rfl: 2   Medications ordered in this encounter:  No orders of the defined types were placed in this encounter.    *If you need refills on other medications prior to your next appointment, please contact your pharmacy*  Follow-Up: Call  back or seek an in-person evaluation if the symptoms worsen or if the condition fails to improve as anticipated.  [facility] [phone]  Other Instructions Take antibiotic (Azithromycin) as directed.  Increase fluids.  Get plenty of rest. Use Mucinex for congestion. Use the Tessalon and albuterol as directed. Take a daily probiotic (I recommend Align or Culturelle, but even Activia Yogurt may be beneficial).  A humidifier placed in the bedroom may offer some relief for a dry, scratchy throat of nasal irritation.  Read information below on acute bronchitis. Please call or  return to clinic if symptoms are not improving.  Acute Bronchitis Bronchitis is when the airways that extend from the windpipe into the lungs get red, puffy, and painful (inflamed). Bronchitis often causes thick spit (mucus) to develop. This leads to a cough. A cough is the most common symptom of bronchitis. In acute bronchitis, the condition usually begins suddenly and goes away over time (usually in 2 weeks). Smoking, allergies, and asthma can make bronchitis worse. Repeated episodes of bronchitis may cause more lung problems.  HOME CARE Rest. Drink enough fluids to keep your pee (urine) clear or pale yellow (unless you need to limit fluids as told by your doctor). Only take over-the-counter or prescription medicines as told by your doctor. Avoid smoking and secondhand smoke. These can make bronchitis worse. If you are a smoker, think about using nicotine gum or skin patches. Quitting smoking will help your lungs heal faster. Reduce the chance of getting bronchitis again by: Washing your hands often. Avoiding people with cold symptoms. Trying not to touch your hands to your mouth, nose, or eyes. Follow up with your doctor as told.  GET HELP IF: Your symptoms do not improve after 1 week of treatment. Symptoms include: Cough. Fever. Coughing up thick spit. Body aches. Chest congestion. Chills. Shortness of breath. Sore throat.  GET HELP RIGHT AWAY IF:  You have an increased fever. You have chills. You have severe shortness of breath. You have bloody thick spit (sputum). You throw up (vomit) often. You lose too much body fluid (dehydration). You have a severe headache. You faint.  MAKE SURE YOU:  Understand these instructions. Will watch your condition. Will get help right away if you are not doing well or get worse. Document Released: 10/30/2007 Document Revised: 01/13/2013 Document Reviewed: 11/03/2012 [facility] Patient Information 2015 [place], [place]. This information is  not intended to replace advice given to you by your health care provider. Make sure you discuss any questions you have with your health care provider.    If you have been instructed to have an in-person evaluation today at a local Urgent Care facility, please use the link below. It will take you to a list of all of our available [facility], including address, phone number and hours of operation. Please do not delay care.  [facility]  If you or a family member do not have a primary care provider, use the link below to schedule a visit and establish care. When you choose a [facility] primary care physician or advanced practice provider, you gain a long-term partner in health. Find a Primary Care Provider  Learn more about [facility] in-office and virtual care options:  - Get Care Now

## 2023-06-12 NOTE — Progress Notes (Signed)
Virtual Visit Consent   [name], you are scheduled for a virtual visit with a [facility] provider today. Just as with appointments in the office, your consent must be obtained to participate. Your consent will be active for this visit and any virtual visit you may have with one of our providers in the next 365 days. If you have a MyChart account, a copy of this consent can be sent to you electronically.  As this is a virtual visit, video technology does not allow for your provider to perform a traditional examination. This may limit your provider's ability to fully assess your condition. If your provider identifies any concerns that need to be evaluated in person or the need to arrange testing (such as labs, EKG, etc.), we will make arrangements to do so. Although advances in technology are sophisticated, we cannot ensure that it will always work on either your end or our end. If the connection with a video visit is poor, the visit may have to be switched to a telephone visit. With either a video or telephone visit, we are not always able to ensure that we have a secure connection.  By engaging in this virtual visit, you consent to the provision of healthcare and authorize for your insurance to be billed (if applicable) for the services provided during this visit. Depending on your insurance coverage, you may receive a charge related to this service.  I need to obtain your verbal consent now. Are you willing to proceed with your visit today? Kirk [name] has provided verbal consent on 06/12/2023 for a virtual visit (video or telephone). [name], [place]  Date: 06/12/2023 11:40 AM  Virtual Visit via Video Note   I, [name], connected with  BENJIMAN HODGEMAN  ([id number], [id number]) on 06/12/23 at 11:45 AM EST by a video-enabled telemedicine application and verified that I am speaking with the correct person using two identifiers.  Location: Patient: Virtual Visit Location  Patient: Home Provider: Virtual Visit Location Provider: Home Office   I discussed the limitations of evaluation and management by telemedicine and the availability of in person appointments. The patient expressed understanding and agreed to proceed.    History of Present Illness: Kirk [name] is a 68 y.o. who identifies as a male who was assigned male at birth, and is being seen today for chest tightness, sore throat with chest congestion, coughing that was dry but now has become productive of colored phlegm. Notes chills as of last night. Some low-grade fever. Tested for COVID 3 times since symptom onset which was negative.  OTC -- Ibuprofen, Mucinex  HPI: HPI  Problems:  Patient Active Problem List   Diagnosis Date Noted   Upper respiratory tract infection 07/11/2022   Iron deficiency anemia 03/28/2022   Need for immunization against influenza 03/08/2022   Chemotherapy-induced neuropathy (HCC) 05/31/2021   Drug-induced neutropenia (HCC) 12/26/2020   Port-A-Cath in place 12/29/2019   Metastatic non-small cell lung cancer (HCC) 07/07/2019   Encounter for antineoplastic chemotherapy 07/07/2019   Encounter for antineoplastic immunotherapy 07/07/2019   Goals of care, counseling/discussion 06/28/2019   Adenocarcinoma of right lung, stage 4 (HCC) 06/28/2019   Cancer associated pain 06/28/2019   CAD (coronary artery disease) 06/08/2019   Mediastinal lymphadenopathy 06/03/2019   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) 06/03/2019   Hemoglobin A1C between 7% and 9% indicating borderline diabetic control (HCC) 05/10/2019   COVID-19 virus infection 05/10/2019   Atrial flutter with rapid ventricular response (  HCC) 04/19/2019   Chest pain with moderate risk for cardiac etiology 04/19/2019   Paroxysmal atrial flutter (HCC) 04/19/2019   Chest congestion 07/10/2018   Cough 07/10/2018   NSTEMI (non-ST elevated myocardial infarction) (HCC) 09/04/2015   PVD (peripheral vascular disease)  (HCC) 06/08/2015   DDD (degenerative disc disease), cervical 06/08/2015   Hyperlipidemia 06/08/2015   Diabetes mellitus with nephropathy (HCC) 06/08/2015   Diabetes mellitus with peripheral vascular disease (HCC) 06/08/2015   Annual physical exam 01/03/2015   PAD (peripheral artery disease) (HCC) 02/18/2013   Diabetes (HCC) 02/18/2013   HTN (hypertension) 02/18/2013    Allergies:  Allergies  Allergen Reactions   Seasonal Ic [Octacosanol]    Medications:  Current Outpatient Medications:    albuterol (VENTOLIN HFA) 108 (90 Base) MCG/ACT inhaler, Inhale 2 puffs into the lungs every 6 (six) hours as needed for wheezing or shortness of breath., Disp: 8 g, Rfl: 0   azithromycin (ZITHROMAX) 250 MG tablet, Take 2 tablets on day 1, then 1 tablet daily on days 2 through 5, Disp: 6 tablet, Rfl: 0   benzonatate (TESSALON) 100 MG capsule, Take 1 capsule (100 mg total) by mouth 3 (three) times daily as needed for cough., Disp: 30 capsule, Rfl: 0   acetaminophen (TYLENOL) 500 MG tablet, Take 500 mg by mouth every 6 (six) hours as needed for moderate pain or fever. , Disp: , Rfl:    aspirin EC 81 MG tablet, Take 81 mg by mouth at bedtime., Disp: , Rfl:    Blood Glucose Monitoring Suppl (ACCU-CHEK GUIDE) w/Device KIT, USE UP TO FOUR TIMES DAILY AS DIRECTED. (FOR ICD-10 E10.9, E11.9)., Disp: 1 kit, Rfl: 0   carvedilol (COREG) 12.5 MG tablet, TAKE 1 TABLET (12.5MG  TOTAL) BY MOUTH TWICE A DAY WITH MEALS, Disp: 180 tablet, Rfl: 1   clopidogrel (PLAVIX) 75 MG tablet, TAKE 1 TABLET BY MOUTH EVERY DAY, Disp: 90 tablet, Rfl: 1   docusate sodium (COLACE) 100 MG capsule, TAKE 1 CAPSULE (100 MG TOTAL) BY MOUTH 2 (TWO) TIMES DAILY AS NEEDED FOR MILD CONSTIPATION., Disp: 180 capsule, Rfl: 3   ferrous sulfate 324 MG TBEC, Take 1 tablet (324 mg total) by mouth daily with breakfast., Disp: 30 tablet, Rfl: 11   gabapentin (NEURONTIN) 300 MG capsule, TAKE 1 CAPSULE BY MOUTH THREE TIMES A DAY, Disp: 270 capsule, Rfl: 3    glimepiride (AMARYL) 2 MG tablet, Take 1 tablet (2 mg total) by mouth daily with breakfast., Disp: 90 tablet, Rfl: 2   Glucose Blood (BLOOD GLUCOSE TEST STRIPS) STRP, 1 each by In Vitro route 3 (three) times daily with meals. May substitute to any manufacturer covered by patient's insurance., Disp: 100 strip, Rfl: 3   JARDIANCE 25 MG TABS tablet, TAKE 1 TABLET BY MOUTH DAILY BEFORE BREAKFAST., Disp: 90 tablet, Rfl: 1   metFORMIN (GLUCOPHAGE-XR) 500 MG 24 hr tablet, Take 1 tablet (500 mg total) by mouth 2 (two) times daily with a meal., Disp: 180 tablet, Rfl: 3   Misc. Devices (ROLLATOR ULTRA-LIGHT) MISC, 1 each by Does not apply route daily., Disp: 1 each, Rfl: 0   nitroGLYCERIN (NITROSTAT) 0.4 MG SL tablet, Place 1 tablet (0.4 mg total) under the tongue every 5 (five) minutes x 3 doses as needed for chest pain., Disp: 25 tablet, Rfl: 2   rosuvastatin (CRESTOR) 40 MG tablet, TAKE 1 TABLET BY MOUTH EVERY DAY, Disp: 90 tablet, Rfl: 3   traZODone (DESYREL) 50 MG tablet, TAKE 0.5-1 TABLETS BY MOUTH AT BEDTIME AS NEEDED FOR  SLEEP., Disp: 90 tablet, Rfl: 2  Observations/Objective: Patient is well-developed, well-nourished in no acute distress.  Resting comfortably at home.  Head is normocephalic, atraumatic.  No labored breathing. Speech is clear and coherent with logical content.  Patient is alert and oriented at baseline.   Assessment and Plan: 1. Acute bacterial bronchitis (Primary) - benzonatate (TESSALON) 100 MG capsule; Take 1 capsule (100 mg total) by mouth 3 (three) times daily as needed for cough.  Dispense: 30 capsule; Refill: 0 - albuterol (VENTOLIN HFA) 108 (90 Base) MCG/ACT inhaler; Inhale 2 puffs into the lungs every 6 (six) hours as needed for wheezing or shortness of breath.  Dispense: 8 g; Refill: 0 - azithromycin (ZITHROMAX) 250 MG tablet; Take 2 tablets on day 1, then 1 tablet daily on days 2 through 5  Dispense: 6 tablet; Refill: 0  Rx Azithromycin.  Increase fluids.  Rest.   Saline nasal spray.  Probiotic.  Mucinex as directed.  Humidifier in bedroom. Tessalon and albuterol per orders.  Call or return to clinic if symptoms are not improving.  Follow Up Instructions: I discussed the assessment and treatment plan with the patient. The patient was provided an opportunity to ask questions and all were answered. The patient agreed with the plan and demonstrated an understanding of the instructions.  A copy of instructions were sent to the patient via MyChart unless otherwise noted below.   The patient was advised to call back or seek an in-person evaluation if the symptoms worsen or if the condition fails to improve as anticipated.    [name], PA-C

## 2023-06-13 ENCOUNTER — Ambulatory Visit: Payer: Self-pay | Admitting: Nurse Practitioner

## 2023-07-14 ENCOUNTER — Inpatient Hospital Stay: Payer: Medicare Other | Attending: Internal Medicine

## 2023-07-14 ENCOUNTER — Inpatient Hospital Stay: Payer: Medicare Other

## 2023-07-14 ENCOUNTER — Encounter (HOSPITAL_COMMUNITY)
Admission: RE | Admit: 2023-07-14 | Discharge: 2023-07-14 | Disposition: A | Discharge status: Home / Self Care | Payer: Medicare Other | Source: Ambulatory Visit | Attending: Internal Medicine | Admitting: Internal Medicine

## 2023-07-14 ENCOUNTER — Other Ambulatory Visit: Payer: Medicare Other

## 2023-07-14 DIAGNOSIS — D649 Anemia, unspecified: Secondary | ICD-10-CM | POA: Insufficient documentation

## 2023-07-14 DIAGNOSIS — C787 Secondary malignant neoplasm of liver and intrahepatic bile duct: Secondary | ICD-10-CM | POA: Diagnosis not present

## 2023-07-14 DIAGNOSIS — J432 Centrilobular emphysema: Secondary | ICD-10-CM | POA: Insufficient documentation

## 2023-07-14 DIAGNOSIS — C3490 Malignant neoplasm of unspecified part of unspecified bronchus or lung: Secondary | ICD-10-CM | POA: Insufficient documentation

## 2023-07-14 DIAGNOSIS — M1611 Unilateral primary osteoarthritis, right hip: Secondary | ICD-10-CM | POA: Diagnosis not present

## 2023-07-14 DIAGNOSIS — N289 Disorder of kidney and ureter, unspecified: Secondary | ICD-10-CM | POA: Diagnosis not present

## 2023-07-14 DIAGNOSIS — I700 Atherosclerosis of aorta: Secondary | ICD-10-CM | POA: Diagnosis not present

## 2023-07-14 LAB — CBC WITH DIFFERENTIAL (CANCER CENTER ONLY)
Abs Immature Granulocytes: 0.01 10*3/uL (ref 0.00–0.07)
Basophils Absolute: 0.1 10*3/uL (ref 0.0–0.1)
Basophils Relative: 1 %
Eosinophils Absolute: 0.2 10*3/uL (ref 0.0–0.5)
Eosinophils Relative: 3 %
HCT: 39.2 % (ref 39.0–52.0)
Hemoglobin: 12.9 g/dL — ABNORMAL LOW (ref 13.0–17.0)
Immature Granulocytes: 0 %
Lymphocytes Relative: 46 %
Lymphs Abs: 2.6 10*3/uL (ref 0.7–4.0)
MCH: 29.5 pg (ref 26.0–34.0)
MCHC: 32.9 g/dL (ref 30.0–36.0)
MCV: 89.7 fL (ref 80.0–100.0)
Monocytes Absolute: 0.5 10*3/uL (ref 0.1–1.0)
Monocytes Relative: 9 %
Neutro Abs: 2.3 10*3/uL (ref 1.7–7.7)
Neutrophils Relative %: 41 %
Platelet Count: 221 10*3/uL (ref 150–400)
RBC: 4.37 MIL/uL (ref 4.22–5.81)
RDW: 12.6 % (ref 11.5–15.5)
WBC Count: 5.6 10*3/uL (ref 4.0–10.5)
nRBC: 0 % (ref 0.0–0.2)

## 2023-07-14 LAB — CMP (CANCER CENTER ONLY)
ALT: 17 U/L (ref 0–44)
AST: 17 U/L (ref 15–41)
Albumin: 4.4 g/dL (ref 3.5–5.0)
Alkaline Phosphatase: 74 U/L (ref 38–126)
Anion gap: 7 (ref 5–15)
BUN: 16 mg/dL (ref 8–23)
CO2: 28 mmol/L (ref 22–32)
Calcium: 9.7 mg/dL (ref 8.9–10.3)
Chloride: 105 mmol/L (ref 98–111)
Creatinine: 1.26 mg/dL — ABNORMAL HIGH (ref 0.61–1.24)
GFR, Estimated: >60 mL/min (ref >60)
Glucose, Bld: 129 mg/dL — ABNORMAL HIGH (ref 70–99)
Potassium: 3.8 mmol/L (ref 3.5–5.1)
Sodium: 140 mmol/L (ref 135–145)
Total Bilirubin: 0.4 mg/dL (ref 0.0–1.2)
Total Protein: 7.7 g/dL (ref 6.5–8.1)

## 2023-07-16 ENCOUNTER — Encounter: Payer: Self-pay | Admitting: Nurse Practitioner

## 2023-07-16 ENCOUNTER — Telehealth (INDEPENDENT_AMBULATORY_CARE_PROVIDER_SITE_OTHER): Payer: Medicare Other | Admitting: Nurse Practitioner

## 2023-07-16 VITALS — Ht 75.0 in | Wt 219.0 lb

## 2023-07-16 DIAGNOSIS — R42 Dizziness and giddiness: Secondary | ICD-10-CM | POA: Diagnosis not present

## 2023-07-16 DIAGNOSIS — E119 Type 2 diabetes mellitus without complications: Secondary | ICD-10-CM

## 2023-07-16 DIAGNOSIS — E559 Vitamin D deficiency, unspecified: Secondary | ICD-10-CM

## 2023-07-16 DIAGNOSIS — R2689 Other abnormalities of gait and mobility: Secondary | ICD-10-CM

## 2023-07-16 NOTE — Progress Notes (Signed)
Virtual Visit via Video Note  I connected with Kirk [name] on 07/16/23 at  9:00 AM EST by a video enabled telemedicine application and verified that I am speaking with the correct person using two identifiers.  Location: Patient: home Provider: office   I discussed the limitations of evaluation and management by telemedicine and the availability of in person appointments. The patient expressed understanding and agreed to proceed.  History of Present Illness:  Patient presents today for a follow up visit through video visit.   Diagnosed and treated for stage 4 lung cancer. States that he completed six rounds of chemotherapy and oral therapy. Was informed by oncologist that cancer is stable, but not in remission. Completes follow up every 3 months for reevaluation of lung cancer. lung cancer still stable. Also, followed by nephrology.      Diabetes Mellitus: Patient presents for follow up of diabetes. Symptoms: none.  Patient denies foot ulcerations, hypoglycemia , nausea, and polydipsia.  Evaluation to date has been included: hemoglobin A1C.  Home sugars: BG. Treatment to date: no recent interventions. Denies monitoring meals, but walks his dog daily. Hgb A1c at last visit was 7.3. Is compliant with medications.  Although he is currently having issues getting Jardiance filled due to insurance.  Patient does follow with nephrology.   Patient is requesting a referral to neurology for dizziness and balance issues.  This has been going on for a while now and patient has had PT with minimal improvement noted.  Denies any other concerns today. Denies any fatigue, chest pain, shortness of breath, HA or dizziness. Denies any blurred vision, numbness or tingling.    Observations/Objective:     07/16/2023    8:44 AM 06/09/2023    8:54 AM 05/05/2023    9:00 AM  Vitals with BMI  Height 6\' 3"     Weight 219 lbs    BMI 27.37    Systolic  133 113  Diastolic  74 64  Pulse  77 73       Assessment and Plan:  1. Dizziness (Primary)  - Ambulatory referral to Neurology  2. Balance problem  - Ambulatory referral to Neurology  3. Type 2 diabetes mellitus without complication, without long-term current use of insulin (HCC)  - AMB Referral [facility] Care Management  4. Vitamin D deficiency  - Vitamin D, 25-hydroxy     I discussed the assessment and treatment plan with the patient. The patient was provided an opportunity to ask questions and all were answered. The patient agreed with the plan and demonstrated an understanding of the instructions.   The patient was advised to call back or seek an in-person evaluation if the symptoms worsen or if the condition fails to improve as anticipated.  I provided 23 minutes of non-face-to-face time during this encounter.   Ivonne Andrew, NP

## 2023-07-16 NOTE — Patient Instructions (Signed)
1. Dizziness (Primary)  - Ambulatory referral to Neurology  2. Balance problem  - Ambulatory referral to Neurology  3. Type 2 diabetes mellitus without complication, without long-term current use of insulin (HCC)  - AMB Referral [facility]I Care Management  4. Vitamin D deficiency  - Vitamin D, 25-hydroxy

## 2023-07-17 ENCOUNTER — Inpatient Hospital Stay: Payer: Medicare Other

## 2023-07-22 ENCOUNTER — Telehealth: Payer: Self-pay

## 2023-07-22 ENCOUNTER — Inpatient Hospital Stay (HOSPITAL_BASED_OUTPATIENT_CLINIC_OR_DEPARTMENT_OTHER): Payer: Medicare Other | Admitting: Internal Medicine

## 2023-07-22 ENCOUNTER — Ambulatory Visit: Payer: Medicare Other

## 2023-07-22 ENCOUNTER — Other Ambulatory Visit: Payer: Medicare Other

## 2023-07-22 VITALS — BP 118/59 | HR 75 | Temp 97.3°F | Resp 18 | Ht 75.0 in | Wt 203.9 lb

## 2023-07-22 DIAGNOSIS — E119 Type 2 diabetes mellitus without complications: Secondary | ICD-10-CM

## 2023-07-22 DIAGNOSIS — C3490 Malignant neoplasm of unspecified part of unspecified bronchus or lung: Secondary | ICD-10-CM | POA: Diagnosis not present

## 2023-07-22 DIAGNOSIS — E559 Vitamin D deficiency, unspecified: Secondary | ICD-10-CM

## 2023-07-22 NOTE — Progress Notes (Signed)
 [facility] [phone]   [phone]  OFFICE PROGRESS NOTE  Kirk Andrew, NP [address] [place] [address]  DIAGNOSIS: Stage IV (TX, N3, M1c) non-small cell lung cancer, poorly differentiated adenocarcinoma presented with bulky mediastinal lymphadenopathy in addition to right axillary lymphadenopathy and metastatic liver lesions, abdominal lymphadenopathy diagnosed in January 2021. Molecular studies by Guardant 360 that shows no actionable mutations.  PRIOR THERAPY: Systemic chemotherapy with carboplatin for AUC of 5, Alimta 500 mg/M2 and Keytruda 200 mg IV every 3 weeks.  First dose July 14, 2019.  Status post 35  cycles. Starting from cycle #5 the patient will be treated with maintenance Alimta and Keytruda every 3 weeks.  Starting from cycle #12 he will be on single agent treatment with Keytruda secondary to renal insufficiency.  CURRENT THERAPY: Observation  INTERVAL HISTORY: Kirk [name] 69 y.o. male returns to the clinic today for 74-month follow-up visit. Discussed the use of AI scribe software for clinical note transcription with the patient, who gave verbal consent to proceed.  History of Present Illness   Kirk WERNTZ "Cherre Huger" is a 68 year old male with adenocarcinoma who presents for routine follow-up.  He has a history of adenocarcinoma diagnosed in January 2021. Initial treatment included chemotherapy with carboplatin, Alimta, and Keytruda every 3 weeks for four cycles, followed by maintenance therapy with Alimta and Keytruda, and then single-agent Keytruda for two years. He has been under observation for more than a year since completing treatment, with no new complaints or signs of recurrence.  He experiences occasional chest discomfort described as a 'wheezing sound' when breathing deeply. No chest pain or breathing issues are present, and he denies experiencing acid reflux. Previous evaluations suggested the  sensation might be related to his lungs.  He notes issues with his right hip, described as 'giving me all kinds of problems.' He recalls being informed about degenerative arthritis in the right hip, while the left hip has screws from a prior procedure. He is not currently receiving any injections for the arthritis.  His hemoglobin level is around 12.9, which is close to the desired range. He is not taking iron supplements.       MEDICAL HISTORY: Past Medical History:  Diagnosis Date   Arthritis    "right knee" (09/04/2015)   Atrial flutter with rapid ventricular response (HCC) 04/19/2019   Dizziness 10/2019   Dyspnea    increased exertion   Elevated serum creatinine 07/2020   HTN (hypertension)    Hyperlipidemia    Kidney disease    stage 3B   Lung cancer (HCC) 05/2019   Microalbuminuria 07/2020   NSTEMI (non-ST elevated myocardial infarction) (HCC) 09/04/2015   a. cath 09/05/2015: 95% stenosis mid-LAD, 55% mid-RCA, and 40% prox-RCA. PCI performed w/ Synergy DES to LAD   PAD (peripheral artery disease) (HCC)    Stenting of bilateral iliacs in 2003.   Refusal of blood transfusions as patient is Jehovah's Witness    Type II diabetes mellitus (HCC)    Vitamin D deficiency 07/2020    ALLERGIES:  is allergic to seasonal ic [octacosanol].  MEDICATIONS:  Current Outpatient Medications  Medication Sig Dispense Refill   acetaminophen (TYLENOL) 500 MG tablet Take 500 mg by mouth every 6 (six) hours as needed for moderate pain or fever.      albuterol (VENTOLIN HFA) 108 (90 Base) MCG/ACT inhaler Inhale 2 puffs into the lungs every 6 (six) hours as  needed for wheezing or shortness of breath. 8 g 0   aspirin EC 81 MG tablet Take 81 mg by mouth at bedtime.     benzonatate (TESSALON) 100 MG capsule Take 1 capsule (100 mg total) by mouth 3 (three) times daily as needed for cough. 30 capsule 0   Blood Glucose Monitoring Suppl (ACCU-CHEK GUIDE) w/Device KIT USE UP TO FOUR TIMES DAILY AS  DIRECTED. (FOR ICD-10 E10.9, E11.9). 1 kit 0   carvedilol (COREG) 12.5 MG tablet TAKE 1 TABLET (12.5MG  TOTAL) BY MOUTH TWICE A DAY WITH MEALS 180 tablet 1   clopidogrel (PLAVIX) 75 MG tablet TAKE 1 TABLET BY MOUTH EVERY DAY 90 tablet 1   docusate sodium (COLACE) 100 MG capsule TAKE 1 CAPSULE (100 MG TOTAL) BY MOUTH 2 (TWO) TIMES DAILY AS NEEDED FOR MILD CONSTIPATION. 180 capsule 3   ferrous sulfate 324 MG TBEC Take 1 tablet (324 mg total) by mouth daily with breakfast. 30 tablet 11   gabapentin (NEURONTIN) 300 MG capsule TAKE 1 CAPSULE BY MOUTH THREE TIMES A DAY 270 capsule 3   glimepiride (AMARYL) 2 MG tablet Take 1 tablet (2 mg total) by mouth daily with breakfast. 90 tablet 2   Glucose Blood (BLOOD GLUCOSE TEST STRIPS) STRP 1 each by In Vitro route 3 (three) times daily with meals. May substitute to any manufacturer covered by patient's insurance. 100 strip 3   JARDIANCE 25 MG TABS tablet TAKE 1 TABLET BY MOUTH DAILY BEFORE BREAKFAST. 90 tablet 1   metFORMIN (GLUCOPHAGE-XR) 500 MG 24 hr tablet Take 1 tablet (500 mg total) by mouth 2 (two) times daily with a meal. 180 tablet 3   Misc. Devices (ROLLATOR ULTRA-LIGHT) MISC 1 each by Does not apply route daily. 1 each 0   nitroGLYCERIN (NITROSTAT) 0.4 MG SL tablet Place 1 tablet (0.4 mg total) under the tongue every 5 (five) minutes x 3 doses as needed for chest pain. 25 tablet 2   rosuvastatin (CRESTOR) 40 MG tablet TAKE 1 TABLET BY MOUTH EVERY DAY 90 tablet 3   traZODone (DESYREL) 50 MG tablet TAKE 0.5-1 TABLETS BY MOUTH AT BEDTIME AS NEEDED FOR SLEEP. 90 tablet 2   No current facility-administered medications for this visit.    SURGICAL HISTORY:  Past Surgical History:  Procedure Laterality Date   CARDIAC CATHETERIZATION N/A 09/05/2015   Procedure: Left Heart Cath and Coronary Angiography;  Surgeon: Lennette Bihari, MD;  Location: [facility] INVASIVE CV LAB;  Service: Cardiovascular;  Laterality: N/A;   CARDIAC CATHETERIZATION N/A 09/05/2015    Procedure: Coronary Stent Intervention;  Surgeon: Lennette Bihari, MD;  Location: [facility] IN[facility]AS[facility] CV [facility];  Service: Cardiovascular;  Laterality: N/A;   IR IMAGING GUIDED PORT INSERTION  07/15/2019   ORIF CONGENITAL HIP DISLOCATION Left ~ 1965   "put pins in it"   PERIPHERAL VASCULAR CATHETERIZATION Bilateral 2003   "stenting"   TONSILLECTOMY  ~ 1963    REVIEW OF SYSTEMS:  Constitutional: negative Eyes: negative Ears, nose, mouth, throat, and face: negative Respiratory: negative Cardiovascular: negative Gastrointestinal: negative Genitourinary:negative Integument/breast: negative Hematologic/lymphatic: negative Musculoskeletal:positive for arthralgias Neurological: negative Behavioral/Psych: negative Endocrine: negative Allergic/Immunologic: negative   PHYSICAL EXAMINATION: General appearance: alert, cooperative, appears stated age, and no distress Head: Normocephalic, without obvious abnormality, atraumatic Neck: no adenopathy, no JVD, supple, symmetrical, trachea midline, and thyroid not enlarged, symmetric, no tenderness/mass/nodules Lymph nodes: Cervical, supraclavicular, and axillary nodes normal. Resp: clear to auscultation bilaterally Back: symmetric, no curvature. ROM normal. No CVA tenderness. Cardio: regular rate and  rhythm, S1, S2 normal, no murmur, click, rub or gallop GI: soft, non-tender; bowel sounds normal; no masses,  no organomegaly Extremities: extremities normal, atraumatic, no cyanosis or edema Neurologic: Alert and oriented X 3, normal strength and tone. Normal symmetric reflexes. Normal coordination and gait  ECOG PERFORMANCE STATUS: 1 - Symptomatic but completely ambulatory  Blood pressure (!) 118/59, pulse 75, temperature (!) 97.3 F (36.3 C), temperature source Temporal, resp. rate 18, height 6\' 3"  (1.905 m), weight 203 lb 14.4 oz (92.5 kg), SpO2 95%.  LABORATORY DATA: Lab Results  Component Value Date   WBC 5.6 07/14/2023   HGB 12.9 (L) 07/14/2023    HCT 39.2 07/14/2023   MCV 89.7 07/14/2023   PLT 221 07/14/2023      Chemistry      Component Value Date/Time   NA 140 07/14/2023 1553   NA 140 06/08/2015 0840   K 3.8 07/14/2023 1553   CL 105 07/14/2023 1553   CO2 28 07/14/2023 1553   BUN 16 07/14/2023 1553   BUN 15 06/08/2015 0840   CREATININE 1.26 (H) 07/14/2023 1553   CREATININE 1.06 10/01/2016 0855      Component Value Date/Time   CALCIUM 9.7 07/14/2023 1553   ALKPHOS 74 07/14/2023 1553   AST 17 07/14/2023 1553   ALT 17 07/14/2023 1553   BILITOT 0.4 07/14/2023 1553       RADIOGRAPHIC STUDIES: CT CHEST ABDOMEN PELVIS WO CONTRAST Result Date: 07/21/2023 CLINICAL DATA:  Non-small cell lung cancer.  * Tracking Code: BO * EXAM: CT CHEST, ABDOMEN AND PELVIS WITHOUT CONTRAST TECHNIQUE: Multidetector CT imaging of the chest, abdomen and pelvis was performed following the standard protocol without IV contrast. RADIATION DOSE REDUCTION: This exam was performed according to the departmental dose-optimization program which includes automated exposure control, adjustment of the mA and/or kV according to patient size and/or use of iterative reconstruction technique. COMPARISON:  03/13/2023. FINDINGS: CT CHEST FINDINGS Cardiovascular: Right IJ Port-A-Cath terminates at the SVC RA junction. Atherosclerotic calcification of the aorta, aortic valve and coronary arteries. Heart size normal. Trace anterior pericardial fluid, unchanged. Enlarged pulmonic trunk. Mediastinum/Nodes: No pathologically enlarged mediastinal or axillary lymph nodes. Hilar regions are difficult to definitively evaluate without IV contrast. Esophagus is grossly unremarkable. Distal periesophageal lymph nodes are not enlarged by CT size criteria and are unchanged. Lungs/Pleura: Centrilobular emphysema. Scattered bibasilar scarring. No suspicious pulmonary nodules. No pleural fluid. Airway is unremarkable. Musculoskeletal: Degenerative changes in the spine. No worrisome lytic or  sclerotic lesions. CT ABDOMEN PELVIS FINDINGS Hepatobiliary: Minimally hypodense lesion in the inferior right hepatic lobe measures 1.8 x 2.4 cm (5/25), stable and previously characterized as a hemangioma. Liver and gallbladder are otherwise unremarkable. No biliary ductal dilatation. Pancreas: Negative. Spleen: Negative. Adrenals/Urinary Tract: Adrenal glands and kidneys are unremarkable. Ureters are decompressed. Bladder is grossly unremarkable. Stomach/Bowel: Stomach, small bowel, appendix and colon are unremarkable. Vascular/Lymphatic: Atherosclerotic calcification of the aorta. No pathologically enlarged lymph nodes. Reproductive: Prostate is visualized. Other: No free fluid. Mesenteries and peritoneum are unremarkable. Similar mesenteric haziness and subcentimeter nodularity. Musculoskeletal: Screws are seen in the proximal left femur. Degenerative changes in the spine and right hip. No worrisome lytic or sclerotic lesions. IMPRESSION: 1. No evidence of recurrent or metastatic disease. 2. Aortic atherosclerosis (ICD10-I70.0). Coronary artery calcification. 3. Enlarged pulmonic trunk, indicative of pulmonary arterial hypertension. 4.  Emphysema (ICD10-J43.9). Electronically Signed   By: Leanna Battles M.D.   On: 07/21/2023 15:08    ASSESSMENT AND PLAN: This is a very pleasant 67  years old African-American male recently diagnosed with a stage IV (TX, N3, M1c) non-small cell lung cancer, poorly differentiated adenocarcinoma presented with bulky mediastinal as well as right hilar, supraclavicular, abdominal and right axillary lymphadenopathy in addition to metastatic liver lesions diagnosed in January 2021. Molecular studies by guardant 360 shows no actionable mutations.  The patient is not a candidate for treatment with targeted therapy or enrollment in the clinical trial with the [facility] regimen. The patient is currently undergoing systemic chemotherapy with carboplatin for AUC of 5, Alimta 500 mg/M2 and  Keytruda 200 mg IV every 3 weeks status post 35 cycles. Starting from cycle #5 he is treated with maintenance Alimta and Keytruda every 3 weeks.  Starting from cycle #12 the patient has been on treatment with single agent Keytruda because of the renal insufficiency. The patient completed 2 years of treatment with immunotherapy. The patient is currently on observation and he is feeling fine with no concerning complaints.  He had repeat CT scan of the chest, abdomen and pelvis performed recently.  I personally independently reviewed the scan and discussed the result with the patient today. His scan showed no concerning findings for disease progression.    Adenocarcinoma of the Lung Diagnosed in January 2021. Underwent chemotherapy with carboplatin, pemetrexed (Alimta), and pembrolizumab (Keytruda) for four cycles, followed by maintenance therapy with Alimta and Keytruda, and then single-agent Keytruda for two years. Currently on observation for over a year with no new complaints. Recent scan shows no worrisome lytic or sclerotic lesions, indicating no recurrence. Good prognosis based on recent scan results. - Continue observation - Follow-up in six months  Degenerative Arthritis Degenerative changes in the right hip and spine. Reports right hip pain but declines injections. Recent scan shows no worrisome findings. Arthritis is the cause of hip pain, not cancer recurrence. - Monitor symptoms - Consider pain management options if symptoms worsen  Anemia Hemoglobin level at 12.9, within acceptable range. Not currently taking iron supplements. - Continue monitoring hemoglobin levels  General Health Maintenance Routine health maintenance discussed. No new issues reported. - Continue routine health maintenance  Follow-up - Follow-up appointment in six months.   For the anemia, he will continue with the oral iron supplement for now. For the renal insufficiency he is followed by nephrology. The  patient will continue to have Port-A-Cath flush every 2 months. The patient was advised to call immediately if he has any other concerning symptoms in the interval. The patient voices understanding of current disease status and treatment options and is in agreement with the current care plan.  All questions were answered. The patient knows to call the clinic with any problems, questions or concerns. We can certainly see the patient much sooner if necessary.  Disclaimer: This note was dictated with voice recognition software. Similar sounding words can inadvertently be transcribed and may not be corrected upon review.

## 2023-07-22 NOTE — Progress Notes (Signed)
   07/22/2023  Patient ID: Kirk [name], male   DOB: [birth year], 68 y.o.   MRN: [id number]  Contacted patient regarding referral for medication access/assistance from Ivonne Andrew, NP .   Left patient a voicemail to return my call at their convenience  Harlon Flor, PharmD Clinical Pharmacist  [phone]

## 2023-07-23 ENCOUNTER — Other Ambulatory Visit: Payer: Self-pay | Admitting: Nurse Practitioner

## 2023-07-23 DIAGNOSIS — E119 Type 2 diabetes mellitus without complications: Secondary | ICD-10-CM

## 2023-07-23 LAB — HEMOGLOBIN A1C
Est. average glucose Bld gHb Est-mCnc: 220 mg/dL
Hgb A1c MFr Bld: 9.3 % — ABNORMAL HIGH (ref 4.8–5.6)

## 2023-07-23 LAB — VITAMIN D 25 HYDROXY (VIT D DEFICIENCY, FRACTURES): Vit D, 25-Hydroxy: 16 ng/mL — ABNORMAL LOW (ref 30.0–100.0)

## 2023-07-23 MED ORDER — VITAMIN D (ERGOCALCIFEROL) 1.25 MG (50000 UNIT) PO CAPS: 50000.0000 [IU] | ORAL_CAPSULE | ORAL | 2 refills | Status: DC

## 2023-07-29 NOTE — Progress Notes (Signed)
   07/29/2023  Patient ID: Kirk [name], male   DOB: 06-10-1955, 68 y.o.   MRN: [id number]  Contacted patient regarding referral for medication access/assistance from Ivonne Andrew, NP .   Appointment scheduled for 07/30/23 at 10am.  Harlon Flor, PharmD Clinical Pharmacist  [phone]

## 2023-07-30 ENCOUNTER — Other Ambulatory Visit: Payer: Self-pay

## 2023-07-30 NOTE — Progress Notes (Addendum)
 07/30/2023 Name: Kirk [name] MRN: [id number] DOB: [birth year]  Chief Complaint  Patient presents with   Diabetes   Medication Assistance    Kirk [name] is a 68 y.o. year old male who presented for a telephone visit.   They were referred to the pharmacist by their PCP for assistance in managing diabetes.    Subjective: Patient cannot afford [place] Pepper, has not since August of 2024, for 90 day supply.   Care Team: Primary Care Provider: Ivonne Andrew, NP ; Next Scheduled Visit: 07/31/23  Medication Access/Adherence  Current Pharmacy:  [id number] [place], [place] - [address] [place] [address] Phone: [phone] Fax: [phone]  [facility] - [place], [place] - [address] [place] [address] Phone: [phone] Fax: [phone]   Patient reports affordability concerns with their medications: Yes  Patient reports access/transportation concerns to their pharmacy: No  Patient reports adherence concerns with their medications:  Yes  financial barriers preventing adherence to Jardiance    Diabetes:  Current medications: metformin XR 500 mg twice daily, glimepiride 2mg  daily, Jardiance 25mg  daily (not had since August)  Current glucose readings: FBG: ~120s Using Accu-Check Guide meter; testing as needed, patient doesn't check regularly   Patient denies hypoglycemic s/sx including dizziness, shakiness, sweating. Patient denies hyperglycemic symptoms including polyuria, polydipsia, polyphagia, nocturia, neuropathy, blurred vision.  Current meal patterns:  - Breakfast: oatmeal (from [employer] or [employer]) - Lunch typically skips, maybe a sandwich - Supper lamb, chicken, fish, steamed veggies (avoids fried food) - Snacks typically fruits - oranges, plums, strawberries - Drinks water (~35oz daily), sweet tea once a wk  Current physical activity: 10-15 min walks with dogs 2-3  times per day  Current medication access support: pt exceeds income limit for all SGLT2, GLP1 PAPs   Objective:  Lab Results  Component Value Date   HGBA1C 9.3 (H) 07/22/2023    Lab Results  Component Value Date   CREATININE 1.26 (H) 07/14/2023   BUN 16 07/14/2023   NA 140 07/14/2023   K 3.8 07/14/2023   CL 105 07/14/2023   CO2 28 07/14/2023    Lab Results  Component Value Date   CHOL 124 07/11/2022   HDL 30 (L) 07/11/2022   LDLCALC 61 07/11/2022   TRIG 195 (H) 07/11/2022   CHOLHDL 4.1 07/11/2022    Medications Reviewed Today     Reviewed by Harlon Flor, [facility] (Pharmacist) on 07/30/23 at 1125  Med List Status: <None>   Medication Order Taking? Sig Documenting Provider Last Dose Status Informant  acetaminophen (TYLENOL) 500 MG tablet [id number] Yes Take 500 mg by mouth every 6 (six) hours as needed for moderate pain or fever.  [provider] Taking Active Self           Med Note Sherilyn Cooter, [facility]   Thu Jul 11, 2022 10:21 AM) Prn   albuterol (VENTOLIN HFA) 108 (90 Base) MCG/ACT inhaler [id number] Yes Inhale 2 puffs into the lungs every 6 (six) hours as needed for wheezing or shortness of breath. Waldon Merl, PA-C Taking Active   aspirin EC 81 MG tablet [id number] Yes Take 81 mg by mouth at bedtime. [provider] Taking Active Self  Blood Glucose Monitoring Suppl (ACCU-CHEK GUIDE) w/Device KIT [id number] Yes USE UP TO FOUR TIMES DAILY AS DIRECTED. (FOR ICD-10 E10.9, E11.9). Ivonne Andrew, NP Taking Active   carvedilol (COREG) 12.5 MG tablet [id number] Yes TAKE 1 TABLET (12.5MG   TOTAL) BY MOUTH TWICE A DAY WITH MEALS Ivonne Andrew, NP Taking Active   clopidogrel (PLAVIX) 75 MG tablet [id number] Yes TAKE 1 TABLET BY MOUTH EVERY DAY Ivonne Andrew, NP Taking Active   docusate sodium (COLACE) 100 MG capsule [id number] Yes TAKE 1 CAPSULE (100 MG TOTAL) BY MOUTH 2 (TWO) TIMES DAILY AS NEEDED FOR MILD CONSTIPATION. Ivonne Andrew, NP Taking Active    gabapentin (NEURONTIN) 300 MG capsule [id number] Yes TAKE 1 CAPSULE BY MOUTH THREE TIMES A DAY Ivonne Andrew, NP Taking Active   glimepiride (AMARYL) 2 MG tablet [id number] Yes Take 1 tablet (2 mg total) by mouth daily with breakfast. Ivonne Andrew, NP Taking Active   Glucose Blood (BLOOD GLUCOSE TEST STRIPS) STRP [id number] Yes 1 each by In Vitro route 3 (three) times daily with meals. May substitute to any manufacturer covered by patient's insurance. Ivonne Andrew, NP Taking Active            Med Note Colvin Caroli, Toma Copier D   Wed Jul 30, 2023 11:06 AM) Testing as needed  JARDIANCE 25 MG TABS tablet [id number] No TAKE 1 TABLET BY MOUTH DAILY BEFORE BREAKFAST.  Patient not taking: Reported on 07/30/2023   Ivonne Andrew, NP Not Taking Active            Med Note Colvin Caroli, Toma Copier D   Wed Jul 30, 2023 11:07 AM) Due to cost barrier  metFORMIN (GLUCOPHAGE-XR) 500 MG 24 hr tablet [id number] Yes Take 1 tablet (500 mg total) by mouth 2 (two) times daily with a meal. Ivonne Andrew, NP Taking Active            Med Note Colvin Caroli, Toma Copier D   Wed Jul 30, 2023 11:24 AM)    Misc. Devices (ROLLATOR [place]) [place] [id number] Yes 1 each by Does not apply route daily. Ivonne Andrew, NP Taking Active   nitroGLYCERIN (NITROSTAT) 0.4 MG SL tablet [id number] Yes Place 1 tablet (0.4 mg total) under the tongue every 5 (five) minutes x 3 doses as needed for chest pain. Sharlene Dory, [place] Taking Active   rosuvastatin (CRESTOR) 40 MG tablet [id number] Yes TAKE 1 TABLET BY MOUTH EVERY DAY Ivonne Andrew, NP Taking Active   traZODone (DESYREL) 50 MG tablet [id number] Yes TAKE 0.5-1 TABLETS BY MOUTH AT BEDTIME AS NEEDED FOR SLEEP. Ivonne Andrew, NP Taking Active   Vitamin D, Ergocalciferol, (DRISDOL) 1.25 MG (50000 UNIT) CAPS capsule [id number] Yes Take 1 capsule (50,000 Units total) by mouth every 7 (seven) days. Ivonne Andrew, NP Taking Active               Assessment/Plan:   Diabetes: - Currently  uncontrolled - Reviewed goal A1c, goal fasting, and goal 2 hour post prandial glucose - Reviewed dietary modifications including carbohydrate counting - Reviewed lifestyle modifications including: increasing physical activity - Discussed options to overcome financial barrier for Jardiance, patient exceeds income limits for all patient assistance programs. Copay for 30 day supply of Jardiance is $567 - Discussed Medicare Prescription Payment Plan (MPPP) option with the patient, he's interested. Will assist in MPPP enrollment so that the patient can afford the Jardiance.    Follow Up Plan: Will follow-up with patient in 2 weeks to check approval status and next steps for MPPP.  Harlon Flor, PharmD Clinical Pharmacist  [phone]

## 2023-07-31 ENCOUNTER — Telehealth: Payer: Self-pay

## 2023-07-31 ENCOUNTER — Ambulatory Visit (INDEPENDENT_AMBULATORY_CARE_PROVIDER_SITE_OTHER): Payer: Self-pay

## 2023-07-31 VITALS — Ht 75.0 in | Wt 220.0 lb

## 2023-07-31 DIAGNOSIS — Z0000 Encounter for general adult medical examination without abnormal findings: Secondary | ICD-10-CM

## 2023-07-31 DIAGNOSIS — Z1212 Encounter for screening for malignant neoplasm of rectum: Secondary | ICD-10-CM

## 2023-07-31 DIAGNOSIS — E1151 Type 2 diabetes mellitus with diabetic peripheral angiopathy without gangrene: Secondary | ICD-10-CM

## 2023-07-31 DIAGNOSIS — Z1211 Encounter for screening for malignant neoplasm of colon: Secondary | ICD-10-CM

## 2023-07-31 DIAGNOSIS — E119 Type 2 diabetes mellitus without complications: Secondary | ICD-10-CM | POA: Diagnosis not present

## 2023-07-31 NOTE — Progress Notes (Signed)
   07/31/2023  Patient ID: Kirk [name], male   DOB: 03-01-1956, 68 y.o.   MRN: [id number]  Called patient to follow up on Medicare Prescription Payment Plan (MPPP) enrollment. Called [employer] together to facilitate enrollment telephonically, but had connectivity issues. The patient will call me back once he has his insurance card so that we can enroll via their patient portal.   Harlon Flor, PharmD Clinical Pharmacist  [phone]

## 2023-07-31 NOTE — Progress Notes (Signed)
 Because this visit was a virtual/telehealth visit,  certain criteria was not obtained, such a blood pressure, CBG if applicable, and timed get up and go. Any medications not marked as "taking" were not mentioned during the medication reconciliation part of the visit. Any vitals not documented were not able to be obtained due to this being a telehealth visit or patient was unable to self-report a recent blood pressure reading due to a lack of equipment at home via telehealth. Vitals that have been documented are verbally provided by the patient.   Subjective:   Kirk [name] is a 68 y.o. who presents for a Medicare Wellness preventive visit.  Visit Complete: Virtual I connected with  Kirk [name] on 07/31/23 by a audio enabled telemedicine application and verified that I am speaking with the correct person using two identifiers.  Patient Location: Home  Provider Location: Home Office  I discussed the limitations of evaluation and management by telemedicine. The patient expressed understanding and agreed to proceed.  Vital Signs: Because this visit was a virtual/telehealth visit, some criteria may be missing or patient reported. Any vitals not documented were not able to be obtained and vitals that have been documented are patient reported.  VideoError- Librarian, academic were attempted between this provider and patient, however failed, due to patient having technical difficulties OR patient did not have access to video capability.  We continued and completed visit with audio only.   AWV Questionnaire: No: Patient Medicare AWV questionnaire was not completed prior to this visit.  Cardiac Risk Factors include: advanced age (>41men, >34 women);diabetes mellitus;dyslipidemia;hypertension;male gender;microalbuminuria;sedentary lifestyle     Objective:    Today's Vitals   07/31/23 1114 07/31/23 1115  Weight: 220 lb (99.8 kg)   Height: 6\' 3"  (1.905 m)   PainSc:   3    Body mass index is 27.5 kg/m.     07/31/2023   11:17 AM 04/28/2023    8:45 AM 11/14/2022   11:14 AM 06/27/2022    9:35 AM 01/16/2022   11:56 AM 06/28/2021   11:59 AM 06/07/2021    9:43 AM  Advanced Directives  Does Patient Have a Medical Advance Directive? No No Yes No Yes Yes   Type of Passenger transport manager Power of [employer] Power of Attorney  Does patient want to make changes to medical advance directive?      No - Patient declined   Copy of Healthcare Power of Attorney in Chart?     No - copy requested No - copy requested No - copy requested  Would patient like information on creating a medical advance directive? No - Patient declined No - Patient declined No - Patient declined No - Patient declined No - Patient declined No - Patient declined     Current Medications (verified) Outpatient Encounter Medications as of 07/31/2023  Medication Sig   acetaminophen (TYLENOL) 500 MG tablet Take 500 mg by mouth every 6 (six) hours as needed for moderate pain or fever.    albuterol (VENTOLIN HFA) 108 (90 Base) MCG/ACT inhaler Inhale 2 puffs into the lungs every 6 (six) hours as needed for wheezing or shortness of breath.   aspirin EC 81 MG tablet Take 81 mg by mouth at bedtime.   Blood Glucose Monitoring Suppl (ACCU-CHEK GUIDE) w/Device KIT USE UP TO FOUR TIMES DAILY AS DIRECTED. (FOR ICD-10 E10.9, E11.9).   carvedilol (COREG) 12.5 MG tablet TAKE 1  TABLET (12.5MG  TOTAL) BY MOUTH TWICE A DAY WITH MEALS   clopidogrel (PLAVIX) 75 MG tablet TAKE 1 TABLET BY MOUTH EVERY DAY   docusate sodium (COLACE) 100 MG capsule TAKE 1 CAPSULE (100 MG TOTAL) BY MOUTH 2 (TWO) TIMES DAILY AS NEEDED FOR MILD CONSTIPATION.   gabapentin (NEURONTIN) 300 MG capsule TAKE 1 CAPSULE BY MOUTH THREE TIMES A DAY   glimepiride (AMARYL) 2 MG tablet Take 1 tablet (2 mg total) by mouth daily with breakfast.   Glucose Blood (BLOOD GLUCOSE TEST STRIPS) STRP  1 each by In Vitro route 3 (three) times daily with meals. May substitute to any manufacturer covered by patient's insurance.   JARDIANCE 25 MG TABS tablet TAKE 1 TABLET BY MOUTH DAILY BEFORE BREAKFAST.   metFORMIN (GLUCOPHAGE-XR) 500 MG 24 hr tablet Take 1 tablet (500 mg total) by mouth 2 (two) times daily with a meal.   Misc. Devices (ROLLATOR ULTRA-LIGHT) MISC 1 each by Does not apply route daily.   nitroGLYCERIN (NITROSTAT) 0.4 MG SL tablet Place 1 tablet (0.4 mg total) under the tongue every 5 (five) minutes x 3 doses as needed for chest pain.   rosuvastatin (CRESTOR) 40 MG tablet TAKE 1 TABLET BY MOUTH EVERY DAY   traZODone (DESYREL) 50 MG tablet TAKE 0.5-1 TABLETS BY MOUTH AT BEDTIME AS NEEDED FOR SLEEP.   Vitamin D, Ergocalciferol, (DRISDOL) 1.25 MG (50000 UNIT) CAPS capsule Take 1 capsule (50,000 Units total) by mouth every 7 (seven) days.   No facility-administered encounter medications on file as of 07/31/2023.    Allergies (verified) Seasonal ic [octacosanol]   History: Past Medical History:  Diagnosis Date   Arthritis    "right knee" (09/04/2015)   Atrial flutter with rapid ventricular response (HCC) 04/19/2019   Dizziness 10/2019   Dyspnea    increased exertion   Elevated serum creatinine 07/2020   HTN (hypertension)    Hyperlipidemia    Kidney disease    stage 3B   Lung cancer (HCC) 05/2019   Microalbuminuria 07/2020   NSTEMI (non-ST elevated myocardial infarction) (HCC) 09/04/2015   a. cath 09/05/2015: 95% stenosis mid-LAD, 55% mid-RCA, and 40% prox-RCA. PCI performed w/ Synergy DES to LAD   PAD (peripheral artery disease) (HCC)    Stenting of bilateral iliacs in 2003.   Refusal of blood transfusions as patient is Jehovah's Witness    Type II diabetes mellitus (HCC)    Vitamin D deficiency 07/2020   Past Surgical History:  Procedure Laterality Date   CARDIAC CATHETERIZATION N/A 09/05/2015   Procedure: Left Heart Cath and Coronary Angiography;  Surgeon: Lennette Bihari, MD;  Location: [facility];  Service: Cardiovascular;  Laterality: N/A;   CARDIAC CATHETERIZATION N/A 09/05/2015   Procedure: Coronary Stent Intervention;  Surgeon: Lennette Bihari, MD;  Location: [facility];  Service: Cardiovascular;  Laterality: N/A;   IR IMAGING GUIDED PORT INSERTION  07/15/2019   ORIF CONGENITAL HIP DISLOCATION Left ~ 1965   "put pins in it"   PERIPHERAL VASCULAR CATHETERIZATION Bilateral 2003   "stenting"   TONSILLECTOMY  ~ 1963   Family History  Problem Relation Age of Onset   Lymphoma Mother    Prostate cancer Father    CAD Neg Hx    Colon cancer Neg Hx    Rectal cancer Neg Hx    Stomach cancer Neg Hx    Esophageal cancer Neg Hx    Social History   Socioeconomic History   Marital status: Married  Spouse name: Not on file   Number of children: 2   Years of education: Not on file   Highest education level: Bachelor's degree (e.g., BA, AB, BS)  Occupational History   Occupation: Furniture salesman-retired  Tobacco Use   Smoking status: Former    Current packs/day: 0.00    Average packs/day: 2.0 packs/day for 35.0 years (70.0 ttl pk-yrs)    Types: Cigarettes    Start date: 02/19/1968    Quit date: 02/19/2003    Years since quitting: 20.4   Smokeless tobacco: Never  Vaping Use   Vaping status: Never Used  Substance and Sexual Activity   Alcohol use: Not Currently    Alcohol/week: 0.0 standard drinks of alcohol   Drug use: Not Currently    Types: Marijuana   Sexual activity: Not Currently  Other Topics Concern   Not on file  Social History Narrative   Not on file   Social Drivers of Health   Financial Resource Strain: Low Risk  (07/31/2023)   Overall Financial Resource Strain (CARDIA)    Difficulty of Paying Living Expenses: Not hard at all  Food Insecurity: No Food Insecurity (07/31/2023)   Hunger Vital Sign    Worried About Running Out of Food in the Last Year: Never true    Ran Out of Food in the Last Year: Never true   Transportation Needs: No Transportation Needs (07/31/2023)   PRAPARE - Administrator, Civil Service (Medical): No    Lack of Transportation (Non-Medical): No  Physical Activity: Patient Declined (07/31/2023)   Exercise Vital Sign    Days of Exercise per Week: Patient declined    Minutes of Exercise per Session: Patient declined  Stress: No Stress Concern Present (07/31/2023)   [employer] of Occupational Health - Occupational Stress Questionnaire    Feeling of Stress : Not at all  Recent Concern: Stress - Stress Concern Present (07/15/2023)   [employer] of Occupational Health - Occupational Stress Questionnaire    Feeling of Stress : To some extent  Social Connections: Socially Integrated (07/31/2023)   Social Connection and Isolation Panel [NHANES]    Frequency of Communication with Friends and Family: More than three times a week    Frequency of Social Gatherings with Friends and Family: More than three times a week    Attends Religious Services: More than 4 times per year    Active Member of [employer] or Organizations: Yes    Attends Engineer, structural: More than 4 times per year    Marital Status: Married    Tobacco Counseling Counseling given: Yes    Clinical Intake:  Pre-visit preparation completed: Yes  Pain : 0-10 Pain Score: 3  Pain Type: Chronic pain Pain Location: Back Pain Orientation: Lower Pain Descriptors / Indicators: Aching Pain Onset: More than a month ago Pain Frequency: Intermittent     BMI - recorded: 27.5 Nutritional Risks: None Diabetes: Yes CBG done?: No (telehealth visit. unable to do cbg) Did pt. bring in CBG monitor from home?: No  How often do you need to have someone help you when you read instructions, pamphlets, or other written materials from your doctor or pharmacy?: 1 - Never  Interpreter Needed?: No  Information entered by ::  W   Activities of Daily Living     07/31/2023   11:16 AM  In your  present state of health, do you have any difficulty performing the following activities:  Hearing? 0  Vision? 0  Difficulty  concentrating or making decisions? 0  Walking or climbing stairs? 0  Dressing or bathing? 0  Doing errands, shopping? 0  Preparing Food and eating ? N  Using the Toilet? N  In the past six months, have you accidently leaked urine? N  Do you have problems with loss of bowel control? N  Managing your Medications? N  Managing your Finances? N  Housekeeping or managing your Housekeeping? N    Patient Care Team: Ivonne Andrew, NP as PCP - General (Pulmonary Disease) Kathleene Hazel, MD as PCP - Cardiology (Cardiology) Syliva Overman, RN as Oncology Nurse Navigator [place], Willaim Rayas, MD as Consulting Physician (Gastroenterology) Si Gaul, MD as Consulting Physician (Oncology) Linus Galas, [place] (Podiatry) Bunnie Domino as Physician Assistant (Cardiology) Alden Hipp, RPH-CPP (Pharmacist)  Indicate any recent Medical Services you may have received from other than [facility] providers in the past year (date may be approximate).     Assessment:   This is a routine wellness examination for [facility].  Hearing/Vision screen No results found.   Goals Addressed             This Visit's Progress    Patient Stated       To get my blood sugars under control        Depression Screen     07/31/2023   11:20 AM 07/16/2023    8:47 AM 10/10/2022   10:13 AM 07/11/2022   10:22 AM 06/27/2022    9:33 AM 03/08/2022    1:57 PM 05/31/2021   11:13 AM  PHQ 2/9 Scores  PHQ - 2 Score 0 0 0 0 0 0 0  PHQ- 9 Score 0     0     Fall Risk     07/31/2023   11:17 AM 10/10/2022   10:13 AM 06/27/2022    9:35 AM 06/27/2022    8:58 AM 03/08/2022    1:56 PM  Fall Risk   Falls in the past year? 0 0 0 0 0  Number falls in past yr: 0 0 0 0 0  Injury with Fall? 0 0 0 0 0  Risk for fall due to : No Fall Risks No Fall Risks No Fall Risks    Follow up Falls  prevention discussed;Falls evaluation completed Falls evaluation completed Falls prevention discussed  Falls evaluation completed    MEDICARE RISK AT HOME:  Medicare Risk at Home Any stairs in or around the home?: Yes If so, are there any without handrails?: Yes Home free of loose throw rugs in walkways, pet beds, electrical cords, etc?: Yes Adequate lighting in your home to reduce risk of falls?: Yes Life alert?: No Use of a cane, walker or w/c?: No Grab bars in the bathroom?: Yes Shower chair or bench in shower?: Yes Elevated toilet seat or a handicapped toilet?: No  TIMED UP AND GO:  Was the test performed?  No  Cognitive Function: 6CIT completed        07/31/2023   11:20 AM 06/27/2022    9:35 AM 02/15/2021    6:45 PM  6CIT Screen  What Year? 0 points 0 points 0 points  What month? 0 points 0 points 0 points  What time? 0 points 0 points 0 points  Count back from 20 0 points 0 points 0 points  Months in reverse 0 points 0 points 0 points  Repeat phrase 0 points 0 points 2 points  Total Score 0 points 0 points 2  points    Immunizations Immunization History  Administered Date(s) Administered   Fluad Quad(high Dose 65+) 03/21/2020, 03/15/2021, 03/08/2022   Fluad Trivalent(High Dose 65+) 03/18/2023   Influenza, High Dose Seasonal PF 03/22/2019   PFIZER(Purple Top)SARS-COV-2 Vaccination 01/18/2020, 07/05/2020, 08/02/2020, 09/27/2020   Pneumococcal Conjugate-13 03/22/2019    Screening Tests Health Maintenance  Topic Date Due   DTaP/Tdap/Td (1 - Tdap) Never done   Zoster Vaccines- Shingrix (1 of 2) Never done   Colonoscopy  05/16/2022   OPHTHALMOLOGY EXAM  05/30/2022   COVID-19 Vaccine (5 - 2024-25 season) 01/26/2023   Diabetic kidney evaluation - Urine ACR  07/12/2023   HEMOGLOBIN A1C  01/19/2024   FOOT EXAM  03/19/2024   Diabetic kidney evaluation - eGFR measurement  07/13/2024   Medicare Annual Wellness (AWV)  07/30/2024   Hepatitis C Screening  Completed   HPV  VACCINES  Aged Out   Pneumonia Vaccine 63+ Years old  Discontinued    Health Maintenance  Health Maintenance Due  Topic Date Due   DTaP/Tdap/Td (1 - Tdap) Never done   Zoster Vaccines- Shingrix (1 of 2) Never done   Colonoscopy  05/16/2022   OPHTHALMOLOGY EXAM  05/30/2022   COVID-19 Vaccine (5 - 2024-25 season) 01/26/2023   Diabetic kidney evaluation - Urine ACR  07/12/2023   Health Maintenance Items Addressed: Referral sent to GI for colonoscopy, UACR (Urine Albumin:Creatinine Ratio)  Additional Screening:  Vision Screening: Recommended annual ophthalmology exams for early detection of glaucoma and other disorders of the eye.  Dental Screening: Recommended annual dental exams for proper oral hygiene  Community Resource Referral / Chronic Care Management: CRR required this visit?  No   CCM required this visit?  No     Plan:     I have personally reviewed and noted the following in the patient's chart:   Medical and social history Use of alcohol, tobacco or illicit drugs  Current medications and supplements including opioid prescriptions. Patient is not currently taking opioid prescriptions. Functional ability and status Nutritional status Physical activity Advanced directives List of other physicians Hospitalizations, surgeries, and ER visits in previous 12 months Vitals Screenings to include cognitive, depression, and falls Referrals and appointments  In addition, I have reviewed and discussed with patient certain preventive protocols, quality metrics, and best practice recommendations. A written personalized care plan for preventive services as well as general preventive health recommendations were provided to patient.     Kirk [name], CMA   07/31/2023   After Visit Summary: (MyChart) Due to this being a telephonic visit, the after visit summary with patients personalized plan was offered to patient via MyChart   Notes: Please refer to Routing Comments.

## 2023-07-31 NOTE — Patient Instructions (Signed)
 Kirk [name] , Thank you for taking time to come for your Medicare Wellness Visit. I appreciate your ongoing commitment to your health goals. Please review the following plan we discussed and let me know if I can assist you in the future.   Referrals/Orders/Follow-Ups/Clinician Recommendations:  Next Medicare Annual Wellness Visit:   August 05, 2024 at 1:10 pm video visit  [facility] [address] [place] [address] [place],  [place]  [address] Main: [phone]  A diabetic urine test has been ordered for you today. You may have this completed at the same lab that you get yearly labs drawn for your primary care provider.   This is a list of the screening recommended for you and due dates:  Health Maintenance  Topic Date Due   DTaP/Tdap/Td vaccine (1 - Tdap) Never done   Zoster (Shingles) Vaccine (1 of 2) Never done   Colon Cancer Screening  05/16/2022   Eye exam for diabetics  05/30/2022   COVID-19 Vaccine (5 - 2024-25 season) 01/26/2023   Yearly kidney health urinalysis for diabetes  07/12/2023   Hemoglobin A1C  01/19/2024   Complete foot exam   03/19/2024   Yearly kidney function blood test for diabetes  07/13/2024   Medicare Annual Wellness Visit  07/30/2024   Hepatitis C Screening  Completed   HPV Vaccine  Aged Out   Pneumonia Vaccine  Discontinued    Advanced directives: (Declined) Advance directive discussed with you today. Even though you declined this today, please call our office should you change your mind, and we can give you the proper paperwork for you to fill out.  Next Medicare Annual Wellness Visit scheduled for next year: yes  Understanding Your Risk for Falls Millions of people have serious injuries from falls each year. It is important to understand your risk of falling. Talk with your health care provider about your risk and what you can do to lower it. If you do have a serious fall, make sure to tell your provider. Falling once raises your risk of  falling again. How can falls affect me? Serious injuries from falls are common. These include: Broken bones, such as hip fractures. Head injuries, such as traumatic brain injuries (TBI) or concussions. A fear of falling can cause you to avoid activities and stay at home. This can make your muscles weaker and raise your risk for a fall. What can increase my risk? There are a number of risk factors that increase your risk for falling. The more risk factors you have, the higher your risk of falling. Serious injuries from a fall happen most often to people who are older than 70 years old. Teenagers and young adults ages 73-29 are also at higher risk. Common risk factors include: Weakness in the lower body. Being generally weak or confused due to long-term (chronic) illness. Dizziness or balance problems. Poor vision. Medicines that cause dizziness or drowsiness. These may include: Medicines for your blood pressure, heart, anxiety, insomnia, or swelling (edema). Pain medicines. Muscle relaxants. Other risk factors include: Drinking alcohol. Having had a fall in the past. Having foot pain or wearing improper footwear. Working at a dangerous job. Having any of the following in your home: Tripping hazards, such as floor clutter or loose rugs. Poor lighting. Pets. Having dementia or memory loss. What actions can I take to lower my risk of falling?     Physical activity Stay physically fit. Do strength and balance exercises. Consider taking a regular class to build  strength and balance. Yoga and tai chi are good options. Vision Have your eyes checked every year and your prescription for glasses or contacts updated as needed. Shoes and walking aids Wear non-skid shoes. Wear shoes that have rubber soles and low heels. Do not wear high heels. Do not walk around the house in socks or slippers. Use a cane or walker as told by your provider. Home safety Attach secure railings on both sides  of your stairs. Install grab bars for your bathtub, shower, and toilet. Use a non-skid mat in your bathtub or shower. Attach bath mats securely with double-sided, non-slip rug tape. Use good lighting in all rooms. Keep a flashlight near your bed. Make sure there is a clear path from your bed to the bathroom. Use night-lights. Do not use throw rugs. Make sure all carpeting is taped or tacked down securely. Remove all clutter from walkways and stairways, including extension cords. Repair uneven or broken steps and floors. Avoid walking on icy or slippery surfaces. Walk on the grass instead of on icy or slick sidewalks. Use ice melter to get rid of ice on walkways in the winter. Use a cordless phone. Questions to ask your health care provider Can you help me check my risk for a fall? Do any of my medicines make me more likely to fall? Should I take a vitamin D supplement? What exercises can I do to improve my strength and balance? Should I make an appointment to have my vision checked? Do I need a bone density test to check for weak bones (osteoporosis)? Would it help to use a cane or a walker? Where to find more information Centers for Disease Control and Prevention, STEADI: TonerPromos.no Community-Based Fall Prevention Programs: TonerPromos.no [employer] on Aging: BaseRingTones.pl Contact a health care provider if: You fall at home. You are afraid of falling at home. You feel weak, drowsy, or dizzy. This information is not intended to replace advice given to you by your health care provider. Make sure you discuss any questions you have with your health care provider. Document Revised: 01/14/2022 Document Reviewed: 01/14/2022 Elsevier Patient Education  2024 [employer].

## 2023-08-04 LAB — HM DIABETES EYE EXAM

## 2023-08-11 ENCOUNTER — Telehealth: Payer: Self-pay

## 2023-08-11 NOTE — Telephone Encounter (Signed)
 Copied from CRM [id number]. Topic: Clinical - Medication Question >> Aug 11, 2023  9:41 AM Kirk [name] wrote: Reason for CRM: Pt called reporting that he is requesting a call back to discuss medication options. He is running out of metformin and wants to know what affordable alternatives he can take since his jardiance was too expensive.   Best contact: [phone]   Pt is advising he spoke to some one form your team about a more affordable medication than metformin. Please advise. KH

## 2023-08-13 ENCOUNTER — Other Ambulatory Visit: Payer: Self-pay | Admitting: Nurse Practitioner

## 2023-08-13 DIAGNOSIS — E1151 Type 2 diabetes mellitus with diabetic peripheral angiopathy without gangrene: Secondary | ICD-10-CM

## 2023-08-13 MED ORDER — METFORMIN HCL ER 500 MG PO TB24
1000.0000 mg | ORAL_TABLET | Freq: Two times a day (BID) | ORAL | 3 refills | Status: DC
Start: 2023-08-13 — End: 2024-03-10

## 2023-08-13 NOTE — Progress Notes (Addendum)
   08/13/2023  Patient ID: Kirk [name], male   DOB: [birth year], 69 y.o.   MRN: [id number]  Called patient to follow up on Medicare Prescription Payment Plan (MPPP) enrollment. The patient is no longer intersted in MPPP and prefers a to remain on tier 1 medications for now. Since patient's most recent eGFR > 60, will recommend increasing Metformin ER to 2000 mg daily. Will collaborate with PCP to medication adjustment.   Harlon Flor, PharmD Clinical Pharmacist  [phone]

## 2023-08-14 NOTE — Telephone Encounter (Signed)
 Called and lvm also sent my chart message. KH

## 2023-08-19 ENCOUNTER — Other Ambulatory Visit: Payer: Self-pay

## 2023-08-19 NOTE — Progress Notes (Signed)
 08/19/2023 Name: Kirk [name] MRN: [id number] DOB: 18-Mar-1956  Chief Complaint  Patient presents with   Diabetes   Medication Management    Kirk [name] is a 68 y.o. year old male who presented for a telephone visit.   They were referred to the pharmacist by their PCP for assistance in managing diabetes and medication access.    Subjective: Patient declined enrollment in the Medicare Prescription Payment Plan (MPPP). Prefers to continue on tier 1 generics for now. Metformin XR dose increased.    Care Team: Primary Care Provider: Ivonne Andrew, NP ; Next Scheduled Visit: 10/13/23   Medication Access/Adherence  Current Pharmacy:  [id number] [place], [place] - [address] [place] [address] Phone: [phone] Fax: [phone]  [facility] - [place], [place] - [address] [place] [address] Phone: [phone] Fax: [phone]   Patient reports affordability concerns with their medications: No  Patient reports access/transportation concerns to their pharmacy: No  Patient reports adherence concerns with their medications:  No     Diabetes:   Current medications: metformin XR 1000 mg twice daily, glimepiride 2 mg daily Medications tried in the past: Jardiance 25 mg daily (discontinued due to financial barriers)  Since renal function has improved and most recent eGFR > 60 on 07/14/23, PCP approved increasing Metformin XR  to 2000 mg daily.  Current glucose readings: FBG ~120s Using Accu-Check Guide meter; testing as needed   Patient denies hypoglycemic s/sx and denies hyperglycemic symptoms.  Current meal patterns: patient reported a couple elevated BG readings after eating peanut butter and jelly sandwiches on white bread. Has since made healthier substitution with a sugar free jelly and whole wheat bread.   Objective:  Lab Results  Component Value Date   HGBA1C 9.3  (H) 07/22/2023    Lab Results  Component Value Date   CREATININE 1.26 (H) 07/14/2023   BUN 16 07/14/2023   NA 140 07/14/2023   K 3.8 07/14/2023   CL 105 07/14/2023   CO2 28 07/14/2023    Lab Results  Component Value Date   CHOL 124 07/11/2022   HDL 30 (L) 07/11/2022   LDLCALC 61 07/11/2022   TRIG 195 (H) 07/11/2022   CHOLHDL 4.1 07/11/2022    Medications Reviewed Today     Reviewed by Kirk [name], [facility] (Pharmacist) on 08/19/23 at 1122  Med List Status: <None>   Medication Order Taking? Sig Documenting Provider Last Dose Status Informant  acetaminophen (TYLENOL) 500 MG tablet [id number] Yes Take 500 mg by mouth every 6 (six) hours as needed for moderate pain or fever.  [provider] Taking Active Self           Med Note Kirk [name], [facility]   Thu Jul 11, 2022 10:21 AM) Prn   albuterol (VENTOLIN HFA) 108 (90 Base) MCG/ACT inhaler [id number] Yes Inhale 2 puffs into the lungs every 6 (six) hours as needed for wheezing or shortness of breath. Kirk Merl, PA-C Taking Active   aspirin EC 81 MG tablet [id number] Yes Take 81 mg by mouth at bedtime. [provider] Taking Active Self  Blood Glucose Monitoring Suppl (ACCU-CHEK GUIDE) w/Device KIT [id number] Yes USE UP TO FOUR TIMES DAILY AS DIRECTED. (FOR ICD-10 E10.9, E11.9). Kirk Andrew, NP Taking Active   carvedilol (COREG) 12.5 MG tablet [id number] Yes TAKE 1 TABLET (12.5MG  TOTAL) BY MOUTH TWICE A DAY WITH MEALS Kirk Andrew, NP Taking Active  clopidogrel (PLAVIX) 75 MG tablet [id number] Yes TAKE 1 TABLET BY MOUTH EVERY DAY Kirk Andrew, NP Taking Active   docusate sodium (COLACE) 100 MG capsule [id number] Yes TAKE 1 CAPSULE (100 MG TOTAL) BY MOUTH 2 (TWO) TIMES DAILY AS NEEDED FOR MILD CONSTIPATION. Kirk Andrew, NP Taking Active   gabapentin (NEURONTIN) 300 MG capsule [id number] Yes TAKE 1 CAPSULE BY MOUTH THREE TIMES A DAY Kirk Andrew, NP Taking Active   glimepiride (AMARYL) 2 MG tablet  [id number] Yes Take 1 tablet (2 mg total) by mouth daily with breakfast. Kirk Andrew, NP Taking Active   Glucose Blood (BLOOD GLUCOSE TEST STRIPS) STRP [id number] Yes 1 each by In Vitro route 3 (three) times daily with meals. May substitute to any manufacturer covered by patient's insurance. Kirk Andrew, NP Taking Active            Med Note Kirk [name], Toma Copier D   Wed Jul 30, 2023 11:06 AM) Testing as needed  JARDIANCE 25 MG TABS tablet [id number] No TAKE 1 TABLET BY MOUTH DAILY BEFORE BREAKFAST.  Patient not taking: Reported on 08/19/2023   Kirk Andrew, NP Not Taking Active            Med Note Kirk [name], Toma Copier D   Wed Jul 30, 2023 11:07 AM) Due to cost barrier  metFORMIN (GLUCOPHAGE-XR) 500 MG 24 hr tablet [id number] Yes Take 2 tablets (1,000 mg total) by mouth 2 (two) times daily with a meal. Paseda, Baird Kay, FNP Taking Active   Misc. Devices (ROLLATOR [place]) [place] [id number] Yes 1 each by Does not apply route daily. Kirk Andrew, NP Taking Active   nitroGLYCERIN (NITROSTAT) 0.4 MG SL tablet [id number] Yes Place 1 tablet (0.4 mg total) under the tongue every 5 (five) minutes x 3 doses as needed for chest pain. Kirk [name], [place] Taking Active   rosuvastatin (CRESTOR) 40 MG tablet [id number] Yes TAKE 1 TABLET BY MOUTH EVERY DAY Kirk Andrew, NP Taking Active   traZODone (DESYREL) 50 MG tablet [id number] Yes TAKE 0.5-1 TABLETS BY MOUTH AT BEDTIME AS NEEDED FOR SLEEP. Kirk Andrew, NP Taking Active   Vitamin D, Ergocalciferol, (DRISDOL) 1.25 MG (50000 UNIT) CAPS capsule [id number] Yes Take 1 capsule (50,000 Units total) by mouth every 7 (seven) days. Kirk Andrew, NP Taking Active               Assessment/Plan:   Diabetes: - Currently uncontrolled, A1c 9.3% - Continue current medication regimen - Reviewed goal A1c, goal fasting, and goal 2 hour post prandial glucose - Reviewed dietary modifications including whole wheat bread and sugar free options when  available - Recommend to check glucose at least once daily     Follow Up Plan: Will follow up with patient in 8 weeks after next PCP appointment. Informed patient that his next visit will be with the pharmacist covering [facility].   Kirk [name], PharmD Clinical Pharmacist  [phone]

## 2023-09-11 ENCOUNTER — Inpatient Hospital Stay: Payer: Medicare Other | Attending: Internal Medicine

## 2023-09-25 ENCOUNTER — Ambulatory Visit: Payer: Medicare Other | Admitting: Neurology

## 2023-09-25 ENCOUNTER — Encounter: Payer: Self-pay | Admitting: Neurology

## 2023-09-25 VITALS — BP 136/68 | HR 73 | Ht 75.0 in | Wt 225.0 lb

## 2023-09-25 DIAGNOSIS — R42 Dizziness and giddiness: Secondary | ICD-10-CM | POA: Diagnosis not present

## 2023-09-25 DIAGNOSIS — Z85118 Personal history of other malignant neoplasm of bronchus and lung: Secondary | ICD-10-CM

## 2023-09-25 DIAGNOSIS — E1142 Type 2 diabetes mellitus with diabetic polyneuropathy: Secondary | ICD-10-CM

## 2023-09-25 DIAGNOSIS — Z7984 Long term (current) use of oral hypoglycemic drugs: Secondary | ICD-10-CM

## 2023-09-25 NOTE — Progress Notes (Signed)
 [facility]  PATIENT: Kirk [name] DOB: 09-09-1955  REQUESTING CLINICIAN: Jerrlyn Morel, NP HISTORY FROM: Patient  REASON FOR VISIT: Dizziness   HISTORICAL  CHIEF COMPLAINT:  Chief Complaint  Patient presents with   New Patient (Initial Visit)    Pt in 16, here alone  Pt is referred for dizziness and imbalance. Pt states he feels off balance, no spinning sensation. Pt states this has been happening on and off for the past 2 years.     HISTORY OF PRESENT ILLNESS:  This is a 68 year old man with past medical history of hypertension, hyperlipidemia, diabetes, recent diagnosis of lung cancer who is presenting with complaints of chronic dizziness. Dizziness has been going on for the past 5 years but getting worse recently. Dizziness described as unsteadiness, no symptoms when sitting down and no symptoms when laying down. Denies room spinning sensation. He has recently completed PT with some improvement  Patient tells me that he was diagnosed with stage IV lung cancer, finished chemo a year ago.    OTHER MEDICAL CONDITIONS: History of lung cancer, s/p chemo, hypertension, hyperlipidemia, diabetes,    REVIEW OF SYSTEMS: Full 14 system review of systems performed and negative with exception of: As noted in the HPI   ALLERGIES: Allergies  Allergen Reactions   Seasonal Ic [Octacosanol]     HOME MEDICATIONS: Outpatient Medications Prior to Visit  Medication Sig Dispense Refill   acetaminophen  (TYLENOL ) 500 MG tablet Take 500 mg by mouth every 6 (six) hours as needed for moderate pain or fever.      albuterol  (VENTOLIN  HFA) 108 (90 Base) MCG/ACT inhaler Inhale 2 puffs into the lungs every 6 (six) hours as needed for wheezing or shortness of breath. 8 g 0   aspirin  EC 81 MG tablet Take 81 mg by mouth at bedtime.     Blood Glucose Monitoring Suppl (ACCU-CHEK GUIDE) w/Device KIT USE UP TO FOUR TIMES DAILY AS DIRECTED. (FOR ICD-10 E10.9, E11.9). 1 kit 0    carvedilol  (COREG ) 12.5 MG tablet TAKE 1 TABLET (12.5MG  TOTAL) BY MOUTH TWICE A DAY WITH MEALS 180 tablet 1   clopidogrel  (PLAVIX ) 75 MG tablet TAKE 1 TABLET BY MOUTH EVERY DAY 90 tablet 1   docusate sodium  (COLACE) 100 MG capsule TAKE 1 CAPSULE (100 MG TOTAL) BY MOUTH 2 (TWO) TIMES DAILY AS NEEDED FOR MILD CONSTIPATION. 180 capsule 3   gabapentin  (NEURONTIN ) 300 MG capsule TAKE 1 CAPSULE BY MOUTH THREE TIMES A DAY 270 capsule 3   glimepiride  (AMARYL ) 2 MG tablet Take 1 tablet (2 mg total) by mouth daily with breakfast. 90 tablet 2   Glucose Blood (BLOOD GLUCOSE TEST STRIPS) STRP 1 each by In Vitro route 3 (three) times daily with meals. May substitute to any manufacturer covered by patient's insurance. 100 strip 3   JARDIANCE  25 MG TABS tablet TAKE 1 TABLET BY MOUTH DAILY BEFORE BREAKFAST. 90 tablet 1   metFORMIN  (GLUCOPHAGE -XR) 500 MG 24 hr tablet Take 2 tablets (1,000 mg total) by mouth 2 (two) times daily with a meal. 180 tablet 3   Misc. Devices (ROLLATOR ULTRA-LIGHT) MISC 1 each by Does not apply route daily. 1 each 0   nitroGLYCERIN  (NITROSTAT ) 0.4 MG SL tablet Place 1 tablet (0.4 mg total) under the tongue every 5 (five) minutes x 3 doses as needed for chest pain. 25 tablet 2   rosuvastatin  (CRESTOR ) 40 MG tablet TAKE 1 TABLET BY MOUTH EVERY DAY 90 tablet 3   traZODone  (  DESYREL ) 50 MG tablet TAKE 0.5-1 TABLETS BY MOUTH AT BEDTIME AS NEEDED FOR SLEEP. 90 tablet 2   Vitamin D , Ergocalciferol , (DRISDOL ) 1.25 MG (50000 UNIT) CAPS capsule Take 1 capsule (50,000 Units total) by mouth every 7 (seven) days. 5 capsule 2   No facility-administered medications prior to visit.    PAST MEDICAL HISTORY: Past Medical History:  Diagnosis Date   Arthritis    "right knee" (09/04/2015)   Atrial flutter with rapid ventricular response (HCC) 04/19/2019   Dizziness 10/2019   Dyspnea    increased exertion   Elevated serum creatinine 07/2020   HTN (hypertension)    Hyperlipidemia    Kidney disease     stage 3B   Lung cancer (HCC) 05/2019   Microalbuminuria 07/2020   NSTEMI (non-ST elevated myocardial infarction) (HCC) 09/04/2015   a. cath 09/05/2015: 95% stenosis mid-LAD, 55% mid-RCA, and 40% prox-RCA. PCI performed w/ Synergy DES to LAD   PAD (peripheral artery disease) (HCC)    Stenting of bilateral iliacs in 2003.   Refusal of blood transfusions as patient is Jehovah's Witness    Type II diabetes mellitus (HCC)    Vitamin D  deficiency 07/2020    PAST SURGICAL HISTORY: Past Surgical History:  Procedure Laterality Date   CARDIAC CATHETERIZATION N/A 09/05/2015   Procedure: Left Heart Cath and Coronary Angiography;  Surgeon: Millicent Ally, MD;  Location: [facility] INVASIVE CV [facility];  Service: Cardiovascular;  Laterality: N/A;   CARDIAC CATHETERIZATION N/A 09/05/2015   Procedure: Coronary Stent Intervention;  Surgeon: Millicent Ally, MD;  Location: [facility]AS[facility] CV [facility];  Service: Cardiovascular;  Laterality: N/A;   IR IMAGING GUIDED PORT INSERTION  07/15/2019   ORIF CONGENITAL HIP DISLOCATION Left ~ 1965   "put pins in it"   PERIPHERAL VASCULAR CATHETERIZATION Bilateral 2003   "stenting"   TONSILLECTOMY  ~ 1963    FAMILY HISTORY: Family History  Problem Relation Age of Onset   Lymphoma Mother    Prostate cancer Father    CAD Neg Hx    Colon cancer Neg Hx    Rectal cancer Neg Hx    Stomach cancer Neg Hx    Esophageal cancer Neg Hx     SOCIAL HISTORY: Social History   Socioeconomic History   Marital status: Married    Spouse name: Not on file   Number of children: 2   Years of education: Not on file   Highest education level: Bachelor's degree (e.g., BA, AB, BS)  Occupational History   Occupation: Furniture salesman-retired  Tobacco Use   Smoking status: Former    Current packs/day: 0.00    Average packs/day: 2.0 packs/day for 35.0 years (70.0 ttl pk-yrs)    Types: Cigarettes    Start date: 02/19/1968    Quit date: 02/19/2003    Years since quitting: 20.6   Smokeless  tobacco: Never  Vaping Use   Vaping status: Never Used  Substance and Sexual Activity   Alcohol use: Not Currently    Alcohol/week: 0.0 standard drinks of alcohol   Drug use: Not Currently    Types: Marijuana   Sexual activity: Not Currently  Other Topics Concern   Not on file  Social History Narrative   Not on file   Social Drivers of Health   Financial Resource Strain: Low Risk  (07/31/2023)   Overall Financial Resource Strain (CARDIA)    Difficulty of Paying Living Expenses: Not hard at all  Food Insecurity: No Food Insecurity (07/31/2023)   Hunger Vital Sign  Worried About Programme researcher, broadcasting/film/video in the Last Year: Never true    Ran Out of Food in the Last Year: Never true  Transportation Needs: No Transportation Needs (07/31/2023)   PRAPARE - Administrator, Civil Service (Medical): No    Lack of Transportation (Non-Medical): No  Physical Activity: Patient Declined (07/31/2023)   Exercise Vital Sign    Days of Exercise per Week: Patient declined    Minutes of Exercise per Session: Patient declined  Stress: No Stress Concern Present (07/31/2023)   [employer] of Occupational Health - Occupational Stress Questionnaire    Feeling of Stress : Not at all  Recent Concern: Stress - Stress Concern Present (07/15/2023)   [employer] of Occupational Health - Occupational Stress Questionnaire    Feeling of Stress : To some extent  Social Connections: Socially Integrated (07/31/2023)   Social Connection and Isolation Panel [NHANES]    Frequency of Communication with Friends and Family: More than three times a week    Frequency of Social Gatherings with Friends and Family: More than three times a week    Attends Religious Services: More than 4 times per year    Active Member of [employer] or Organizations: Yes    Attends Engineer, structural: More than 4 times per year    Marital Status: Married  Catering manager Violence: Not At Risk (07/31/2023)   Humiliation, Afraid,  Rape, and Kick questionnaire    Fear of Current or Ex-Partner: No    Emotionally Abused: No    Physically Abused: No    Sexually Abused: No    PHYSICAL EXAM  GENERAL EXAM/CONSTITUTIONAL: Vitals:  Vitals:   09/25/23 0838  BP: 136/68  Pulse: 73  Weight: 225 lb (102.1 kg)  Height: 6\' 3"  (1.905 m)   Body mass index is 28.12 kg/m. Wt Readings from Last 3 Encounters:  09/25/23 225 lb (102.1 kg)  07/31/23 220 lb (99.8 kg)  07/22/23 203 lb 14.4 oz (92.5 kg)  Orthostatic Vitals for the past 48 hrs (Last 6 readings):  BP Pulse Cuff Size Patient Position (if appropriate) BP- Lying Pulse- Lying  09/25/23 0838 136/68 73 Normal -- -- --  09/25/23 0840 -- -- -- Orthostatic Vitals 136/68 73     Patient is in no distress; well developed, nourished and groomed; neck is supple  MUSCULOSKELETAL: Gait, strength, tone, movements noted in Neurologic exam below  NEUROLOGIC: MENTAL STATUS:      No data to display         awake, alert, oriented to person, place and time recent and remote memory intact normal attention and concentration language fluent, comprehension intact, naming intact fund of knowledge appropriate  CRANIAL NERVE:  2nd, 3rd, 4th, 6th - Visual fields full to confrontation, extraocular muscles intact, no nystagmus 5th - facial sensation symmetric 7th - facial strength symmetric 8th - hearing intact 9th - palate elevates symmetrically, uvula midline 11th - shoulder shrug symmetric 12th - tongue protrusion midline  MOTOR:  normal bulk and tone, full strength in the BUE, BLE  SENSORY:  normal and symmetric to light touch, decrease proprioception, pinprick and vibration up to the knees   COORDINATION:  finger-nose-finger, fine finger movements normal  GAIT/STATION:  Wide based, unsteady, unable to tandem, positive Romberg     DIAGNOSTIC DATA (LABS, IMAGING, TESTING) - I reviewed patient records, labs, notes, testing and imaging myself where  available.  Lab Results  Component Value Date   WBC 5.6 07/14/2023   HGB  12.9 (L) 07/14/2023   HCT 39.2 07/14/2023   MCV 89.7 07/14/2023   PLT 221 07/14/2023      Component Value Date/Time   NA 140 07/14/2023 1553   NA 140 06/08/2015 0840   K 3.8 07/14/2023 1553   CL 105 07/14/2023 1553   CO2 28 07/14/2023 1553   GLUCOSE 129 (H) 07/14/2023 1553   BUN 16 07/14/2023 1553   BUN 15 06/08/2015 0840   CREATININE 1.26 (H) 07/14/2023 1553   CREATININE 1.06 10/01/2016 0855   CALCIUM  9.7 07/14/2023 1553   PROT 7.7 07/14/2023 1553   PROT 8.5 06/28/2020 0819   ALBUMIN 4.4 07/14/2023 1553   ALBUMIN 4.5 06/28/2020 0819   AST 17 07/14/2023 1553   ALT 17 07/14/2023 1553   ALKPHOS 74 07/14/2023 1553   BILITOT 0.4 07/14/2023 1553   GFRNONAA >60 07/14/2023 1553   GFRNONAA 75 10/01/2016 0855   GFRAA 58 (L) 02/10/2020 1120   GFRAA 87 10/01/2016 0855   Lab Results  Component Value Date   CHOL 124 07/11/2022   HDL 30 (L) 07/11/2022   LDLCALC 61 07/11/2022   TRIG 195 (H) 07/11/2022   CHOLHDL 4.1 07/11/2022   Lab Results  Component Value Date   HGBA1C 9.3 (H) 07/22/2023   Lab Results  Component Value Date   VITAMINB12 473 07/31/2020   Lab Results  Component Value Date   TSH 0.575 08/30/2021      ASSESSMENT AND PLAN  68 y.o. year old male with hypertension, hyperlipidemia, diabetes, diabetic polyneuropathy, recent diagnosis of lung cancer who is presenting with complaints of dizziness getting worse. His exam is consistent with peripheral neuropathy. He has abnormal proprioception, decrease pinprick and vibration up to the ankles. He also has a positive Romberg. I have informed patient that his chronic dizziness is most likely caused by his peripheral neuropathy but due to his history of lung cancer, will obtain MRI brain to rule out metastatic disease. I will contact him to go over the results, continue current medications, discussed tight glucose control and to increase his fluid  intake. Return sooner if worse.    1. Diabetic peripheral neuropathy (HCC)   2. Dizziness   3. History of lung cancer      Patient Instructions  MRI Brain to rule out metastatic disease  Continue current medications  Tight glucose management  Increase fluid intake  Please call if neuropathic pain get worse  Return as needed    Orders Placed This Encounter  Procedures   MR BRAIN W WO CONTRAST    No orders of the defined types were placed in this encounter.   Return if symptoms worsen or fail to improve.  I have spent a total of 45 minutes dedicated to this patient today, preparing to see patient, performing a medically appropriate examination and evaluation, ordering tests and/or medications and procedures, and counseling and educating the patient/family/caregiver; independently interpreting result and communicating results to the family/patient/caregiver; and documenting clinical information in the electronic medical record.   Cassandra Cleveland, MD 09/25/2023, 5:34 PM  [facility] [address] [place], [place] [address] [phone]

## 2023-09-25 NOTE — Patient Instructions (Addendum)
 MRI Brain to rule out metastatic disease  Continue current medications  Tight glucose management  Increase fluid intake  Please call if neuropathic pain get worse  Return as needed

## 2023-10-01 ENCOUNTER — Telehealth: Payer: Self-pay | Admitting: Neurology

## 2023-10-01 NOTE — Telephone Encounter (Signed)
 no auth required sent to GI [phone]

## 2023-10-05 ENCOUNTER — Other Ambulatory Visit: Payer: Self-pay | Admitting: Nurse Practitioner

## 2023-10-06 ENCOUNTER — Encounter (HOSPITAL_COMMUNITY): Payer: Self-pay

## 2023-10-09 ENCOUNTER — Ambulatory Visit (HOSPITAL_COMMUNITY)
Admission: RE | Admit: 2023-10-09 | Discharge: 2023-10-09 | Disposition: A | Discharge status: Home / Self Care | Source: Ambulatory Visit | Attending: Neurology | Admitting: Neurology

## 2023-10-09 DIAGNOSIS — R42 Dizziness and giddiness: Secondary | ICD-10-CM | POA: Insufficient documentation

## 2023-10-09 MED ORDER — GADOBUTROL 1 MMOL/ML IV SOLN
10.0000 mL | Freq: Once | INTRAVENOUS | Status: AC | PRN
  Administered 2023-10-09: 10 mL via INTRAVENOUS

## 2023-10-10 ENCOUNTER — Ambulatory Visit: Payer: Self-pay | Admitting: Neurology

## 2023-10-13 ENCOUNTER — Encounter: Payer: Self-pay | Admitting: Nurse Practitioner

## 2023-10-13 ENCOUNTER — Ambulatory Visit (INDEPENDENT_AMBULATORY_CARE_PROVIDER_SITE_OTHER): Payer: Self-pay | Admitting: Nurse Practitioner

## 2023-10-13 DIAGNOSIS — E1142 Type 2 diabetes mellitus with diabetic polyneuropathy: Secondary | ICD-10-CM | POA: Diagnosis not present

## 2023-10-13 LAB — POCT GLYCOSYLATED HEMOGLOBIN (HGB A1C): Hemoglobin A1C: 7.7 % — AB (ref 4.0–5.6)

## 2023-10-13 NOTE — Progress Notes (Signed)
 Subjective   Patient ID: Kirk [name], male    DOB: [birth year], 68 y.o.   MRN: [id number]  Chief Complaint  Patient presents with   Diabetes    Follow up, patient is requesting A1C to be checked     Referring provider: Jerrlyn Morel, NP  CAZ WEAVER is a 68 y.o. male with Past Medical History: No date: Arthritis     Comment:  "right knee" (09/04/2015) 04/19/2019: Atrial flutter with rapid ventricular response (HCC) 10/2019: Dizziness No date: Dyspnea     Comment:  increased exertion 07/2020: Elevated serum creatinine No date: HTN (hypertension) No date: Hyperlipidemia No date: Kidney disease     Comment:  stage 3B 05/2019: Lung cancer (HCC) 07/2020: Microalbuminuria 09/04/2015: NSTEMI (non-ST elevated myocardial infarction) (HCC)     Comment:  a. cath 09/05/2015: 95% stenosis mid-LAD, 55% mid-RCA,               and 40% prox-RCA. PCI performed w/ Synergy DES to LAD No date: PAD (peripheral artery disease) (HCC)     Comment:  Stenting of bilateral iliacs in 2003. No date: Refusal of blood transfusions as patient is Jehovah's Witness No date: Type II diabetes mellitus (HCC) 07/2020: Vitamin D  deficiency   HPI  Diagnosed and treated for stage 4 lung cancer. States that he completed six rounds of chemotherapy and oral therapy. Was informed by oncologist that cancer is stable, but not in remission. Completes follow up every 3 months for reevaluation of lung cancer. lung cancer still stable. Also, followed by nephrology.      Diabetes Mellitus: Patient presents for follow up of diabetes. Symptoms: none.  Patient denies foot ulcerations, hypoglycemia , nausea, and polydipsia.  Evaluation to date has been included: hemoglobin A1C.  Home sugars: BG. Treatment to date: no recent interventions. Denies monitoring meals, but walks his dog daily. Hgb A1c 7.3. Is compliant with medications.  Although he is currently having issues getting Jardiance  filled due to insurance.  Patient  does follow with nephrology.      Denies any other concerns today. Denies any fatigue, chest pain, shortness of breath, HA or dizziness. Denies any blurred vision, numbness or tingling.    Allergies  Allergen Reactions   Seasonal Ic [Octacosanol]     Immunization History  Administered Date(s) Administered   Fluad Quad(high Dose 65+) 03/21/2020, 03/15/2021, 03/08/2022   Fluad Trivalent(High Dose 65+) 03/18/2023   Influenza, High Dose Seasonal PF 03/22/2019   PFIZER(Purple Top)SARS-COV-2 Vaccination 01/18/2020, 07/05/2020, 08/02/2020, 09/27/2020   Pneumococcal Conjugate-13 03/22/2019    Tobacco History: Social History   Tobacco Use  Smoking Status Former   Current packs/day: 0.00   Average packs/day: 2.0 packs/day for 35.0 years (70.0 ttl pk-yrs)   Types: Cigarettes   Start date: 02/19/1968   Quit date: 02/19/2003   Years since quitting: 20.6  Smokeless Tobacco Never   Counseling given: Not Answered   Outpatient Encounter Medications as of 10/13/2023  Medication Sig   acetaminophen  (TYLENOL ) 500 MG tablet Take 500 mg by mouth every 6 (six) hours as needed for moderate pain or fever.    albuterol  (VENTOLIN  HFA) 108 (90 Base) MCG/ACT inhaler Inhale 2 puffs into the lungs every 6 (six) hours as needed for wheezing or shortness of breath.   aspirin  EC 81 MG tablet Take 81 mg by mouth at bedtime.   Blood Glucose Monitoring Suppl (ACCU-CHEK GUIDE) w/Device KIT USE UP TO FOUR TIMES DAILY AS DIRECTED. (FOR ICD-10 E10.9, E11.9).   carvedilol  (  COREG ) 12.5 MG tablet TAKE 1 TABLET (12.5MG  TOTAL) BY MOUTH TWICE A DAY WITH MEALS   clopidogrel  (PLAVIX ) 75 MG tablet TAKE 1 TABLET BY MOUTH EVERY DAY   docusate sodium  (COLACE) 100 MG capsule TAKE 1 CAPSULE (100 MG TOTAL) BY MOUTH 2 (TWO) TIMES DAILY AS NEEDED FOR MILD CONSTIPATION.   gabapentin  (NEURONTIN ) 300 MG capsule TAKE 1 CAPSULE BY MOUTH THREE TIMES A DAY   glimepiride  (AMARYL ) 2 MG tablet Take 1 tablet (2 mg total) by mouth daily with  breakfast.   Glucose Blood (BLOOD GLUCOSE TEST STRIPS) STRP 1 each by In Vitro route 3 (three) times daily with meals. May substitute to any manufacturer covered by patient's insurance.   metFORMIN  (GLUCOPHAGE -XR) 500 MG 24 hr tablet Take 2 tablets (1,000 mg total) by mouth 2 (two) times daily with a meal.   nitroGLYCERIN  (NITROSTAT ) 0.4 MG SL tablet Place 1 tablet (0.4 mg total) under the tongue every 5 (five) minutes x 3 doses as needed for chest pain.   rosuvastatin  (CRESTOR ) 40 MG tablet TAKE 1 TABLET BY MOUTH EVERY DAY   Vitamin D , Ergocalciferol , (DRISDOL ) 1.25 MG (50000 UNIT) CAPS capsule Take 1 capsule (50,000 Units total) by mouth every 7 (seven) days.   JARDIANCE  25 MG TABS tablet TAKE 1 TABLET BY MOUTH DAILY BEFORE BREAKFAST. (Patient not taking: Reported on 10/13/2023)   Misc. Devices (ROLLATOR ULTRA-LIGHT) MISC 1 each by Does not apply route daily. (Patient not taking: Reported on 10/13/2023)   traZODone  (DESYREL ) 50 MG tablet TAKE 0.5-1 TABLETS BY MOUTH AT BEDTIME AS NEEDED FOR SLEEP. (Patient not taking: Reported on 10/13/2023)   No facility-administered encounter medications on file as of 10/13/2023.    Review of Systems  Review of Systems  Constitutional: Negative.   HENT: Negative.    Cardiovascular: Negative.   Gastrointestinal: Negative.   Allergic/Immunologic: Negative.   Neurological: Negative.   Psychiatric/Behavioral: Negative.       Objective:   BP 123/67   Pulse 73   Temp (!) 97.3 F (36.3 C) (Oral)   Wt 221 lb 6.4 oz (100.4 kg)   SpO2 97%   BMI 27.67 kg/m   Wt Readings from Last 5 Encounters:  10/13/23 221 lb 6.4 oz (100.4 kg)  09/25/23 225 lb (102.1 kg)  07/31/23 220 lb (99.8 kg)  07/22/23 203 lb 14.4 oz (92.5 kg)  07/16/23 219 lb (99.3 kg)     Physical Exam Vitals and nursing note reviewed.  Constitutional:      General: He is not in acute distress.    Appearance: He is well-developed.  Cardiovascular:     Rate and Rhythm: Normal rate and  regular rhythm.  Pulmonary:     Effort: Pulmonary effort is normal.     Breath sounds: Normal breath sounds.  Skin:    General: Skin is warm and dry.  Neurological:     Mental Status: He is alert and oriented to person, place, and time.       Assessment & Plan:   Diabetic peripheral neuropathy (HCC) -     Microalbumin / creatinine urine ratio -     POCT glycosylated hemoglobin (Hb A1C)     Return in about 3 months (around 01/13/2024).   Jerrlyn Morel, NP 10/13/2023

## 2023-10-13 NOTE — Patient Instructions (Signed)
 1. Diabetic peripheral neuropathy (HCC)  - Urine Albumin/Creatinine with ratio (send out) [LAB689] - POCT glycosylated hemoglobin (Hb A1C)

## 2023-10-14 LAB — MICROALBUMIN / CREATININE URINE RATIO
Creatinine, Urine: 113.2 mg/dL
Microalb/Creat Ratio: 261 mg/g{creat} — ABNORMAL HIGH (ref 0–29)
Microalbumin, Urine: 295.5 ug/mL

## 2023-10-16 ENCOUNTER — Ambulatory Visit: Payer: Self-pay | Admitting: Nurse Practitioner

## 2023-10-16 ENCOUNTER — Other Ambulatory Visit: Payer: Self-pay | Admitting: Nurse Practitioner

## 2023-10-16 DIAGNOSIS — N289 Disorder of kidney and ureter, unspecified: Secondary | ICD-10-CM

## 2023-10-24 ENCOUNTER — Telehealth: Payer: Self-pay | Admitting: Nurse Practitioner

## 2023-10-24 NOTE — Telephone Encounter (Signed)
 Copied from CRM [id number]. Topic: Referral - Question >> Oct 23, 2023 10:38 AM [place] B wrote: Reason for CRM: pt states Dr Era Hasty  referred him to a Kidney Dr,but pt sates he already has one at [facility], A Dr Yvonnie Heritage and has an appt in 6 months, Does the pt need to go before 6 months? Please cb

## 2023-10-31 ENCOUNTER — Other Ambulatory Visit: Payer: Self-pay | Admitting: Nurse Practitioner

## 2023-10-31 DIAGNOSIS — T451X5A Adverse effect of antineoplastic and immunosuppressive drugs, initial encounter: Secondary | ICD-10-CM

## 2023-11-03 NOTE — Telephone Encounter (Signed)
 Please advise [facility]

## 2023-11-10 ENCOUNTER — Other Ambulatory Visit: Payer: Self-pay

## 2023-11-10 ENCOUNTER — Other Ambulatory Visit (HOSPITAL_COMMUNITY): Payer: Self-pay

## 2023-11-10 ENCOUNTER — Encounter: Payer: Self-pay | Admitting: Internal Medicine

## 2023-11-10 ENCOUNTER — Telehealth: Payer: Self-pay

## 2023-11-10 NOTE — Addendum Note (Signed)
 Addended by: Adra Alanis on: 11/10/2023 04:45 PM   Modules accepted: Orders

## 2023-11-10 NOTE — Progress Notes (Unsigned)
 11/10/2023 Name: Kirk [name] MRN: [id number] DOB: Mar 12, 1956  No chief complaint on file.   Kirk [name] is a 68 y.o. year old male who presented for a telephone visit.   They were referred to the pharmacist by their PCP for assistance in managing diabetes. PMH includes HTN, PAD, T2DM, CAD s/p NSTEMI (2017), A flutter with RVR, adenocarcinoma of R lung (metastatic, stage 4, diagnosed Jan 2021) s/p chemo - currently stable, HLD.   Subjective: Patient was last engaged by Livia Riffle, PharmD on 08/19/23. He reported that BG had improved since increasing metformin  XR to 1000 mg BID. He had continued glimepiride  2 mg daily. He was recently seen by neurology for worsening dizziness. Brain MRI did not show any acute findings. He was seen by PCP, Abbey Hobby, NP on 10/13/23  Care Team: Primary Care Provider: Jerrlyn Morel, NP ; Next Scheduled Visit: 01/14/24 Oncology: Marlene Simas, MD: Scheduled 01/15/24  Medication Access/Adherence  Current Pharmacy:  [id number], [place] - [address] [place] [address] Phone: [phone] Fax: [phone]  [facility] - [place], [place] - [address] [place] [address] Phone: [phone] Fax: [phone]   Patient reports affordability concerns with their medications: {YES/NO:21197} Patient reports access/transportation concerns to their pharmacy: {YES/NO:21197} Patient reports adherence concerns with their medications:  {YES/NO:21197} ***  Jardiance  not filled since 01/03/23 for 90 ds   Diabetes:  Current medications: metformin  XR 1000 mg twice daily, glimepiride  2 mg daily  Medications tried in the past: Jardiance  25 mg daily (discontinued due to financial barriers)   Current glucose readings: *** Using *** meter; testing *** times daily   Patient {Actions; denies-reports:120008} hypoglycemic s/sx including ***dizziness,  shakiness, sweating. Patient {Actions; denies-reports:120008} hyperglycemic symptoms including ***polyuria, polydipsia, polyphagia, nocturia, neuropathy, blurred vision.  Current meal patterns:  - Breakfast: *** - Lunch *** - Supper *** - Snacks *** - Drinks ***  Current physical activity: ***  Current medication access support: ***   Objective:  BP Readings from Last 3 Encounters:  10/13/23 123/67  09/25/23 136/68  07/22/23 (!) 118/59   UACR 10/13/23: 261 mg/g   Lab Results  Component Value Date   HGBA1C 7.7 (A) 10/13/2023   HGBA1C 9.3 (H) 07/22/2023   HGBA1C 7.3 (A) 04/14/2023    Lab Results  Component Value Date   CREATININE 1.26 (H) 07/14/2023   BUN 16 07/14/2023   NA 140 07/14/2023   K 3.8 07/14/2023   CL 105 07/14/2023   CO2 28 07/14/2023   eGFR 07/14/23 > 60 mL/min (previous 51 mL/min)  Lab Results  Component Value Date   CHOL 124 07/11/2022   HDL 30 (L) 07/11/2022   LDLCALC 61 07/11/2022   TRIG 195 (H) 07/11/2022   CHOLHDL 4.1 07/11/2022    Medications Reviewed Today   Medications were not reviewed in this encounter       Assessment/Plan:   Diabetes: - Currently {CHL Controlled/Uncontrolled:[phone]} - Reviewed long term cardiovascular and renal outcomes of uncontrolled blood sugar - Reviewed goal A1c, goal fasting, and goal 2 hour post prandial glucose - Reviewed dietary modifications including *** - Reviewed lifestyle modifications including:  - Recommend to ***  - Patient denies personal or family history of multiple endocrine neoplasia type 2, medullary thyroid  cancer; personal history of pancreatitis or gallbladder disease. - Recommend to check glucose *** - Meets financial criteria for *** patient assistance program through ***. Will collaborate with provider,  CPhT, and patient to pursue assistance.     Follow Up Plan: ***  Arthea Larsson, PharmD PGY1 Pharmacy Resident

## 2023-11-10 NOTE — Progress Notes (Signed)
 Attempted to contact patient x2 for scheduled appointment for medication management. Patient had specifically requested that I call at 1:30PM. Left HIPAA compliant message and MyChart for patient to return my call at their convenience.   Arthea Larsson, PharmD PGY1 Pharmacy Resident

## 2023-11-10 NOTE — Progress Notes (Signed)
 11/10/2023 Name: Kirk [name] MRN: [id number] DOB: 12-04-1955  Chief Complaint  Patient presents with   Medication Management   Diabetes   Hyperlipidemia    Kirk [name] is a 68 y.o. year old male who presented for a telephone visit.   They were referred to the pharmacist by their PCP for assistance in managing diabetes. PMH includes HTN, PAD (bilateral ilac stents), T2DM, CAD [name]/p NSTEMI (2017 - multivessel CAD with 95% stenosis in the mid-LAD, 55% mid-RCA, and 40% prox-RCA. Successful PCI was performed with a Synergy DES to the LAD ), A flutter with RVR (stopped Eliquis  2/2 cost and does not wish to restart in the setting of metastatic cancer), adenocarcinoma of [name] lung (metastatic, stage 4, diagnosed Jan 2021) [name]/p chemo - currently stable, HLD.   Subjective: Patient was last engaged by Kirk [name], PharmD on 08/19/23. He reported that BG had improved since increasing metformin  XR to 1000 mg BID. He had continued glimepiride  2 mg daily. He was recently seen by neurology for worsening dizziness. Brain MRI did not show any acute findings. He was seen by PCP, Kirk Hobby, NP on 10/13/23 - It was recommended that he continuing following with nephrology for elevated UACR.   Today, patient reports that he is doing wel.   Care Team: Primary Care Provider: Jerrlyn Morel, NP ; Next Scheduled Visit: 01/14/24 Oncology: Kirk Simas, MD: Scheduled 01/15/24  Medication Access/Adherence  Current Pharmacy[facility]  [phone] Kirk [name], [place] - [address] [place] [address] Phone: [phone] Fax: [phone]  [name] - [place], [place] - [address] [place] [address] Phone: [phone] Fax: [phone]   Patient reports affordability concerns with their medications: Yes  - Jardiance  test claim today $556 for 30ds (due to deductible + coinsurance). Patient reports access/transportation  concerns to their pharmacy: No  Patient reports adherence concerns with their medications:  No    Jardiance  not filled since 01/03/23 for 90 ds  Patient is planning to change his insurance plan at open enrollment through his work (this fall - new plan would be active Jan 2026).   Diabetes:  Current medications: metformin  XR 1000 mg twice daily, glimepiride  2 mg daily  Medications tried in the past: Jardiance  25 mg daily (discontinued due to financial barriers)   Current glucose readings:  Using Accu Chek meter; testing 1 times daily - patient does not have meter with him to go through readings.   Patient denies hypoglycemic [name]/sx including  shakiness, sweating. Endorses dizziness - but this is usual for him [name]/p chemo. He has checked his BG during dizzy spells before and the lowest he has seen is 80 mg/dL. Denies BG < 70 mg/dL.  Patient denies hyperglycemic symptoms including polyuria, polydipsia, polyphagia, blurred vision. He has stable nocturia and neuropathy [name]/p chemotherapy.   Current meal patterns: Eating more vegetables, cut out on sweets - feels that changes in diet are biggest reason for decrease in A1C. - Drinks: mostly water during the day, occasional tea or soda  Current physical activity: Walks dog once daily.   Current medication access support: none - he reports that his income is > 300% [employer] required for [employer] or [employer] PAP programs. He is not interested in setting up the Medicare Payment Plan at this time.   Objective:  BP Readings from Last 3 Encounters:  10/13/23 123/67  09/25/23 136/68  07/22/23 (!) 118/59   UACR 10/13/23: 261  mg/g   Lab Results  Component Value Date   HGBA1C 7.7 (A) 10/13/2023   HGBA1C 9.3 (H) 07/22/2023   HGBA1C 7.3 (A) 04/14/2023    Lab Results  Component Value Date   CREATININE 1.26 (H) 07/14/2023   BUN 16 07/14/2023   NA 140 07/14/2023   K 3.8 07/14/2023   CL 105 07/14/2023   CO2 28 07/14/2023   eGFR 07/14/23 > 60 mL/min  (previous 51 mL/min)  Lab Results  Component Value Date   CHOL 124 07/11/2022   HDL 30 (L) 07/11/2022   LDLCALC 61 07/11/2022   TRIG 195 (H) 07/11/2022   CHOLHDL 4.1 07/11/2022    Medications Reviewed Today     Reviewed by Kirk [name], RPH (Pharmacist) on 11/10/23 at 1630  Med List Status: <None>   Medication Order Taking? Sig Documenting Provider Last Dose Status Informant  acetaminophen  (TYLENOL ) 500 MG tablet [id number]  Take 500 mg by mouth every 6 (six) hours as needed for moderate pain or fever.  [provider]  Active Self           Med Note Kirk [name], Kirk [name]   Thu Jul 11, 2022 10:21 AM) Prn   albuterol  (VENTOLIN  HFA) 108 [phone] Base) MCG/ACT inhaler [id number]  Inhale 2 puffs into the lungs every 6 (six) hours as needed for wheezing or shortness of breath. Kirk [name]  Active   aspirin  EC 81 MG tablet [id number] Yes Take 81 mg by mouth at bedtime. [provider]  Active Self  Blood Glucose Monitoring Suppl (ACCU-CHEK GUIDE) w/Device KIT [id number]  USE UP TO FOUR TIMES DAILY AS DIRECTED. (FOR ICD-10 E10.9, E11.9). Kirk Morel, NP  Active   carvedilol  (COREG ) 12.5 MG tablet [id number] Yes TAKE 1 TABLET (12.5MG  TOTAL) BY MOUTH TWICE A DAY WITH [name] Kirk [name], Kirk S, NP  Active   clopidogrel  (PLAVIX ) 75 MG tablet [id number] Yes TAKE 1 TABLET BY MOUTH EVERY DAY Kirk [name], Kirk S, NP  Active   docusate sodium  (COLACE) 100 MG capsule [id number]  TAKE 1 CAPSULE (100 MG TOTAL) BY MOUTH 2 (TWO) TIMES DAILY AS NEEDED FOR MILD CONSTIPATION. Kirk Morel, NP  Active   gabapentin  (NEURONTIN ) 300 MG capsule [id number] Yes TAKE 1 CAPSULE BY MOUTH THREE TIMES A DAY [employer], Kirk R, FNP  Active   glimepiride  (AMARYL ) 2 MG tablet [id number] Yes Take 1 tablet (2 mg total) by mouth daily with breakfast. Kirk Morel, NP  Active   Glucose Blood (BLOOD GLUCOSE TEST STRIPS) STRP [id number]  1 each by In Vitro route 3 (three) times daily with [name]. May substitute to  any manufacturer covered by patient'[name] insurance. Kirk Morel, NP  Active            Med Note Kirk [name], Kirk [name]   Wed Jul 30, 2023 11:06 AM) Testing as needed  JARDIANCE  25 MG TABS tablet [id number]  TAKE 1 TABLET BY MOUTH DAILY BEFORE BREAKFAST.  Patient not taking: Reported on 10/13/2023   Kirk Morel, NP  Consider Medication Status and Discontinue (Cost of medication)            Med Note Kirk [name], Kirk [name]   Wed Jul 30, 2023 11:07 AM) Due to cost barrier  metFORMIN  (GLUCOPHAGE -XR) 500 MG 24 hr tablet [id number] Yes Take 2 tablets (1,000 mg total) by mouth 2 (two) times daily with a meal. Paseda, Kirk R, FNP  Active   Misc. Devices (ROLLATOR [place]) [place] [id number]  1 each by Does not  apply route daily.  Patient not taking: Reported on 10/13/2023   Kirk Morel, NP  Active   nitroGLYCERIN  (NITROSTAT ) 0.4 MG SL tablet [id number]  Place 1 tablet (0.4 mg total) under the tongue every 5 (five) minutes x 3 doses as needed for chest pain. Von Grumbling, PA-C  Active   rosuvastatin  (CRESTOR ) 40 MG tablet [id number] Yes TAKE 1 TABLET BY MOUTH EVERY DAY Kirk [name], Kirk S, NP  Active   traZODone  (DESYREL ) 50 MG tablet [id number]  TAKE 0.5-1 TABLETS BY MOUTH AT BEDTIME AS NEEDED FOR SLEEP.  Patient not taking: Reported on 10/13/2023   Kirk Morel, NP  Active   Vitamin [name] , Ergocalciferol , (DRISDOL ) 1.25 MG (50000 UNIT) CAPS capsule [id number] Yes Take 1 capsule (50,000 Units total) by mouth every 7 (seven) days. Kirk Morel, NP  Active             Assessment/Plan:   Diabetes: - Currently controlled with last A1C of 7.7% below goal < 8% given co-morbidities (Stage IV lung cancer, currently stable), but much improved from 9.3%. Patient feels improvement is largely due to changes in diet. Commended his efforts and encouraged him to keep up the good work. There are limited options for escalation in pharmacotherapy at this point, as he will not be able to afford any brand name  medications with his current insurance plan, but also is not eligible for manufacturer assistance. He is not having symptomatic hyper or hypoglycemia, but is aware of hypoglycemia management plan. Do not feel that he needs to start insulin  at this point. I agree that he should be on an SGLT2i for kidney protection with last UACR >200 mg/g, however it is reasonable to wait to initiate this until he has a different insurance plan. Encouraged him to follow up with nephrology as scheduled.  - Reviewed long term cardiovascular and renal outcomes of uncontrolled blood sugar - Reviewed goal A1c, goal fasting, and goal 2 hour post prandial glucose - Reviewed dietary modifications including limited intake of carbohydrate-heavy foods, increasing protein intake, increasing water intake. - Reviewed lifestyle modifications including: increasing physical activity. - Recommend to continue metformin  XR 1000 mg twice daily, glimepiride  2 mg daily. Monitor renal function closely as metformin  dose reduction may be warranted in the future.   - Recommend to check glucose once daily fasting - Next A1C and repeat lipid panel due August 2025.    Follow Up Plan: PCP 01/14/24, Pharmacist telephone   Arthea Larsson, PharmD PGY1 Pharmacy Resident

## 2023-11-10 NOTE — Telephone Encounter (Unsigned)
 Copied from CRM [id number]. Topic: General - Other >> Nov 10, 2023  1:45 PM [place]us [place] B wrote: Reason for CRM: pt called in because he missed the call with the pharmacy that's in the clinic building he said if they can call him back .

## 2023-11-11 ENCOUNTER — Other Ambulatory Visit: Payer: Self-pay | Admitting: Nurse Practitioner

## 2023-11-11 NOTE — Telephone Encounter (Signed)
 Please advise [facility]

## 2023-11-18 ENCOUNTER — Other Ambulatory Visit

## 2023-11-20 ENCOUNTER — Inpatient Hospital Stay: Attending: Internal Medicine

## 2023-11-20 DIAGNOSIS — C3490 Malignant neoplasm of unspecified part of unspecified bronchus or lung: Secondary | ICD-10-CM | POA: Insufficient documentation

## 2023-11-20 DIAGNOSIS — Z9221 Personal history of antineoplastic chemotherapy: Secondary | ICD-10-CM | POA: Insufficient documentation

## 2023-11-20 DIAGNOSIS — Z95828 Presence of other vascular implants and grafts: Secondary | ICD-10-CM

## 2023-11-20 DIAGNOSIS — C787 Secondary malignant neoplasm of liver and intrahepatic bile duct: Secondary | ICD-10-CM | POA: Diagnosis not present

## 2023-11-20 MED ORDER — HEPARIN SOD (PORK) LOCK FLUSH 100 UNIT/ML IV SOLN
500.0000 [IU] | Freq: Once | INTRAVENOUS | Status: AC
  Administered 2023-11-20: 500 [IU]

## 2023-11-20 MED ORDER — SODIUM CHLORIDE 0.9% FLUSH
10.0000 mL | Freq: Once | INTRAVENOUS | Status: AC
  Administered 2023-11-20: 10 mL

## 2023-11-25 ENCOUNTER — Other Ambulatory Visit

## 2023-12-04 ENCOUNTER — Ambulatory Visit: Admission: EM | Admit: 2023-12-04 | Discharge: 2023-12-04 | Disposition: A | Discharge status: Home / Self Care

## 2023-12-04 DIAGNOSIS — T63441A Toxic effect of venom of bees, accidental (unintentional), initial encounter: Secondary | ICD-10-CM

## 2023-12-04 MED ORDER — METHYLPREDNISOLONE SODIUM SUCC 125 MG IJ SOLR
80.0000 mg | Freq: Once | INTRAMUSCULAR | Status: AC
  Administered 2023-12-04: 80 mg via INTRAMUSCULAR

## 2023-12-04 MED ORDER — EPINEPHRINE 0.3 MG/0.3ML IJ SOAJ: 0.3000 mg | INTRAMUSCULAR | 0 refills | Status: AC | PRN

## 2023-12-04 NOTE — ED Triage Notes (Signed)
 I am feeling better, not itching like I wise

## 2023-12-04 NOTE — ED Triage Notes (Signed)
 About an hour ago stung in numerous places by bee's and now hives, itching, swelling, I feel like my throat is closing.

## 2023-12-05 NOTE — ED Provider Notes (Signed)
 [facility]    CSN: [id number] Arrival date & time: 12/04/23  1524      History   Chief Complaint Chief Complaint  Patient presents with   Insect Bite   Respiratory Distress    HPI Kirk [name] is a 68 y.o. male.   Patient here today for evaluation of numerous bee stings that occurred 1 hour ago.  He reports in the past he has had anaphylactic reaction to same.  He no longer has an EpiPen .  He does feel as if he had some throat closure due to bee stings.  He has also had hives, itching and swelling.  He did take 2 Benadryl  prior to arrival.  He denies any shortness of breath.  The history is provided by the patient.    Past Medical History:  Diagnosis Date   Arthritis    right knee (09/04/2015)   Atrial flutter with rapid ventricular response (HCC) 04/19/2019   Dizziness 10/2019   Dyspnea    increased exertion   Elevated serum creatinine 07/2020   HTN (hypertension)    Hyperlipidemia    Kidney disease    stage 3B   Lung cancer (HCC) 05/2019   Microalbuminuria 07/2020   NSTEMI (non-ST elevated myocardial infarction) (HCC) 09/04/2015   a. cath 09/05/2015: 95% stenosis mid-LAD, 55% mid-RCA, and 40% prox-RCA. PCI performed w/ Synergy DES to LAD   PAD (peripheral artery disease) (HCC)    Stenting of bilateral iliacs in 2003.   Refusal of blood transfusions as patient is Jehovah's Witness    Type II diabetes mellitus (HCC)    Vitamin D  deficiency 07/2020    Patient Active Problem List   Diagnosis Date Noted   Upper respiratory tract infection 07/11/2022   Iron deficiency anemia 03/28/2022   Need for immunization against influenza 03/08/2022   Chemotherapy-induced neuropathy (HCC) 05/31/2021   Drug-induced neutropenia (HCC) 12/26/2020   Port-A-Cath in place 12/29/2019   Metastatic non-small cell lung cancer (HCC) 07/07/2019   Encounter for antineoplastic chemotherapy 07/07/2019   Encounter for antineoplastic immunotherapy 07/07/2019   Goals of care,  counseling/discussion 06/28/2019   Adenocarcinoma of right lung, stage 4 (HCC) 06/28/2019   Cancer associated pain 06/28/2019   CAD (coronary artery disease) 06/08/2019   Mediastinal lymphadenopathy 06/03/2019   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) 06/03/2019   Hemoglobin A1C between 7% and 9% indicating borderline diabetic control (HCC) 05/10/2019   COVID-19 virus infection 05/10/2019   Atrial flutter with rapid ventricular response (HCC) 04/19/2019   Chest pain with moderate risk for cardiac etiology 04/19/2019   Paroxysmal atrial flutter (HCC) 04/19/2019   Chest congestion 07/10/2018   Cough 07/10/2018   NSTEMI (non-ST elevated myocardial infarction) (HCC) 09/04/2015   PVD (peripheral vascular disease) (HCC) 06/08/2015   DDD (degenerative disc disease), cervical 06/08/2015   Hyperlipidemia 06/08/2015   Diabetes mellitus with nephropathy (HCC) 06/08/2015   Diabetes mellitus with peripheral vascular disease (HCC) 06/08/2015   Annual physical exam 01/03/2015   PAD (peripheral artery disease) (HCC) 02/18/2013   Diabetes (HCC) 02/18/2013   HTN (hypertension) 02/18/2013    Past Surgical History:  Procedure Laterality Date   CARDIAC CATHETERIZATION N/A 09/05/2015   Procedure: Left Heart Cath and Coronary Angiography;  Surgeon: Debby DELENA Sor, MD;  Location: [facility];  Service: Cardiovascular;  Laterality: N/A;   CARDIAC CATHETERIZATION N/A 09/05/2015   Procedure: Coronary Stent Intervention;  Surgeon: Debby DELENA Sor, MD;  Location: [facility];  Service: Cardiovascular;  Laterality: N/A;  IR IMAGING GUIDED PORT INSERTION  07/15/2019   ORIF CONGENITAL HIP DISLOCATION Left ~ 1965   put pins in it   PERIPHERAL VASCULAR CATHETERIZATION Bilateral 2003   stenting   TONSILLECTOMY  ~ 1963       Home Medications    Prior to Admission medications   Medication Sig Start Date End Date Taking? Authorizing Provider  diphenhydrAMINE  (BENADRYL ) 25 mg capsule Take  50 mg by mouth every 6 (six) hours as needed.   Yes [provider]  EPINEPHrine  0.3 mg/0.3 mL IJ SOAJ injection Inject 0.3 mg into the muscle as needed for anaphylaxis. 12/04/23  Yes Billy Asberry FALCON, PA-C  acetaminophen  (TYLENOL ) 500 MG tablet Take 500 mg by mouth every 6 (six) hours as needed for moderate pain or fever.     [provider]  albuterol  (VENTOLIN  HFA) 108 (90 Base) MCG/ACT inhaler Inhale 2 puffs into the lungs every 6 (six) hours as needed for wheezing or shortness of breath. 06/12/23   Gladis Elsie BROCKS, PA-C  aspirin  EC 81 MG tablet Take 81 mg by mouth at bedtime.    [provider]  Blood Glucose Monitoring Suppl (ACCU-CHEK GUIDE) w/Device KIT USE UP TO FOUR TIMES DAILY AS DIRECTED. (FOR ICD-10 E10.9, E11.9). 07/18/22   Oley Bascom RAMAN, NP  carvedilol  (COREG ) 12.5 MG tablet TAKE 1 TABLET (12.5MG  TOTAL) BY MOUTH TWICE A DAY WITH MEALS 04/09/23   Oley Bascom RAMAN, NP  clopidogrel  (PLAVIX ) 75 MG tablet TAKE 1 TABLET BY MOUTH EVERY DAY 04/09/23   Nichols, Tonya S, NP  docusate sodium  (COLACE) 100 MG capsule TAKE 1 CAPSULE (100 MG TOTAL) BY MOUTH 2 (TWO) TIMES DAILY AS NEEDED FOR MILD CONSTIPATION. 03/26/23 03/25/24  Oley Bascom RAMAN, NP  gabapentin  (NEURONTIN ) 300 MG capsule TAKE 1 CAPSULE BY MOUTH THREE TIMES A DAY 11/03/23   Paseda, Folashade R, FNP  glimepiride  (AMARYL ) 2 MG tablet Take 1 tablet (2 mg total) by mouth daily with breakfast. 04/14/23   Oley Bascom RAMAN, NP  Glucose Blood (BLOOD GLUCOSE TEST STRIPS) STRP 1 each by In Vitro route 3 (three) times daily with meals. May substitute to any manufacturer covered by patient's insurance. 08/27/22   Oley Bascom RAMAN, NP  metFORMIN  (GLUCOPHAGE -XR) 500 MG 24 hr tablet Take 2 tablets (1,000 mg total) by mouth 2 (two) times daily with a meal. 08/13/23   Paseda, Folashade R, FNP  Misc. Devices (ROLLATOR ULTRA-LIGHT) MISC 1 each by Does not apply route daily. Patient not taking: Reported on 10/13/2023 03/08/22    Oley Bascom RAMAN, NP  nitroGLYCERIN  (NITROSTAT ) 0.4 MG SL tablet Place 1 tablet (0.4 mg total) under the tongue every 5 (five) minutes x 3 doses as needed for chest pain. 07/15/22   Lucien Orren SAILOR, PA-C  rosuvastatin  (CRESTOR ) 40 MG tablet TAKE 1 TABLET BY MOUTH EVERY DAY 10/06/23   Nichols, Tonya S, NP  traZODone  (DESYREL ) 50 MG tablet TAKE 0.5-1 TABLETS BY MOUTH AT BEDTIME AS NEEDED FOR SLEEP. Patient not taking: Reported on 10/13/2023 08/02/22   Oley Bascom RAMAN, NP  Vitamin D , Ergocalciferol , (DRISDOL ) 1.25 MG (50000 UNIT) CAPS capsule TAKE 1 CAPSULE (50,000 UNITS TOTAL) BY MOUTH EVERY 7 (SEVEN) DAYS 11/12/23   Oley Bascom RAMAN, NP    Family History Family History  Problem Relation Age of Onset   Lymphoma Mother    Prostate cancer Father    CAD Neg Hx    Colon cancer Neg Hx    Rectal cancer Neg Hx  Stomach cancer Neg Hx    Esophageal cancer Neg Hx     Social History Social History   Tobacco Use   Smoking status: Former    Current packs/day: 0.00    Average packs/day: 2.0 packs/day for 35.0 years (70.0 ttl pk-yrs)    Types: Cigarettes    Start date: 02/19/1968    Quit date: 02/19/2003    Years since quitting: 20.8   Smokeless tobacco: Never  Vaping Use   Vaping status: Never Used  Substance Use Topics   Alcohol use: Not Currently    Alcohol/week: 0.0 standard drinks of alcohol   Drug use: Not Currently    Types: Marijuana     Allergies   Bee venom and Seasonal ic [octacosanol]   Review of Systems Review of Systems  Constitutional:  Negative for chills and fever.  Eyes:  Negative for discharge and redness.  Respiratory:  Negative for shortness of breath.   Skin:  Positive for color change. Negative for wound.  Neurological:  Negative for numbness.     Physical Exam Triage Vital Signs ED Triage Vitals  Encounter Vitals Group     BP 12/04/23 1534 129/73     Girls Systolic BP Percentile --      Girls Diastolic BP Percentile --      Boys Systolic BP Percentile  --      Boys Diastolic BP Percentile --      Pulse Rate 12/04/23 1534 85     Resp 12/04/23 1534 20     Temp 12/04/23 1534 97.8 F (36.6 C)     Temp Source 12/04/23 1534 Oral     SpO2 12/04/23 1534 95 %     Weight 12/04/23 1533 221 lb 5.5 oz (100.4 kg)     Height 12/04/23 1533 6' 3 (1.905 m)     Head Circumference --      Peak Flow --      Pain Score 12/04/23 1533 0     Pain Loc --      Pain Education --      Exclude from Growth Chart --    No data found.  Updated Vital Signs BP 129/73 (BP Location: Left Arm)   Pulse 80   Temp 97.8 F (36.6 C) (Oral)   Resp 20   Ht 6' 3 (1.905 m)   Wt 221 lb 5.5 oz (100.4 kg)   SpO2 94%   BMI 27.67 kg/m   Visual Acuity Right Eye Distance:   Left Eye Distance:   Bilateral Distance:    Right Eye Near:   Left Eye Near:    Bilateral Near:     Physical Exam Vitals and nursing note reviewed.  Constitutional:      General: He is not in acute distress.    Appearance: Normal appearance. He is not ill-appearing.  HENT:     Head: Normocephalic and atraumatic.     Comments: No facial swelling    Nose: Nose normal. No congestion.     Mouth/Throat:     Mouth: Mucous membranes are moist.     Pharynx: Oropharynx is clear. No oropharyngeal exudate or posterior oropharyngeal erythema.     Comments: Maintaining secretions without difficulty Eyes:     Conjunctiva/sclera: Conjunctivae normal.  Cardiovascular:     Rate and Rhythm: Normal rate and regular rhythm.  Pulmonary:     Effort: Pulmonary effort is normal. No respiratory distress.     Breath sounds: No stridor. No wheezing, rhonchi or rales.  Skin:    Comments: Multiple wheal like lesions where stings reportedly occurred  Neurological:     Mental Status: He is alert.  Psychiatric:        Mood and Affect: Mood normal.        Behavior: Behavior normal.        Thought Content: Thought content normal.      UC Treatments / Results  Labs (all labs ordered are listed, but only  abnormal results are displayed) Labs Reviewed - No data to display  EKG   Radiology No results found.  Procedures Procedures (including critical care time)  Medications Ordered in UC Medications  methylPREDNISolone  sodium succinate (SOLU-MEDROL ) 125 mg/2 mL injection 80 mg (80 mg Intramuscular Given 12/04/23 1539)    Initial Impression / Assessment and Plan / UC Course  I have reviewed the triage vital signs and the nursing notes.  Pertinent labs & imaging results that were available during my care of the patient were reviewed by me and considered in my medical decision making (see chart for details).    Patient stable throughout visit.  Methylprednisolone  injection administered and patient monitored for an hour.  He reports symptoms have improved significantly prior to leaving urgent care office.  EpiPen  prescribed for future stings if needed and advised further evaluation should he develop any symptoms concerning for worsening allergic reaction.   Final Clinical Impressions(s) / UC Diagnoses   Final diagnoses:  Bee sting reaction, accidental or unintentional, initial encounter   Discharge Instructions   None    ED Prescriptions     Medication Sig Dispense Auth. Provider   EPINEPHrine  0.3 mg/0.3 mL IJ SOAJ injection Inject 0.3 mg into the muscle as needed for anaphylaxis. 1 each Billy Asberry FALCON, PA-C      PDMP not reviewed this encounter.   Billy Asberry FALCON, PA-C 12/05/23 1256

## 2023-12-28 ENCOUNTER — Other Ambulatory Visit: Payer: Self-pay | Admitting: Nurse Practitioner

## 2023-12-28 DIAGNOSIS — E1151 Type 2 diabetes mellitus with diabetic peripheral angiopathy without gangrene: Secondary | ICD-10-CM

## 2024-01-08 ENCOUNTER — Inpatient Hospital Stay: Payer: Medicare Other | Attending: Internal Medicine

## 2024-01-08 DIAGNOSIS — Z08 Encounter for follow-up examination after completed treatment for malignant neoplasm: Secondary | ICD-10-CM | POA: Insufficient documentation

## 2024-01-08 DIAGNOSIS — N289 Disorder of kidney and ureter, unspecified: Secondary | ICD-10-CM | POA: Diagnosis not present

## 2024-01-08 DIAGNOSIS — D649 Anemia, unspecified: Secondary | ICD-10-CM | POA: Diagnosis not present

## 2024-01-08 DIAGNOSIS — Z9221 Personal history of antineoplastic chemotherapy: Secondary | ICD-10-CM | POA: Diagnosis not present

## 2024-01-08 DIAGNOSIS — Z85118 Personal history of other malignant neoplasm of bronchus and lung: Secondary | ICD-10-CM | POA: Diagnosis not present

## 2024-01-08 DIAGNOSIS — C3490 Malignant neoplasm of unspecified part of unspecified bronchus or lung: Secondary | ICD-10-CM

## 2024-01-08 DIAGNOSIS — Z95828 Presence of other vascular implants and grafts: Secondary | ICD-10-CM

## 2024-01-08 LAB — CBC WITH DIFFERENTIAL (CANCER CENTER ONLY)
Abs Immature Granulocytes: 0.01 K/uL (ref 0.00–0.07)
Basophils Absolute: 0 K/uL (ref 0.0–0.1)
Basophils Relative: 1 %
Eosinophils Absolute: 0.2 K/uL (ref 0.0–0.5)
Eosinophils Relative: 2 %
HCT: 36.5 % — ABNORMAL LOW (ref 39.0–52.0)
Hemoglobin: 12.2 g/dL — ABNORMAL LOW (ref 13.0–17.0)
Immature Granulocytes: 0 %
Lymphocytes Relative: 38 %
Lymphs Abs: 2.4 K/uL (ref 0.7–4.0)
MCH: 29.8 pg (ref 26.0–34.0)
MCHC: 33.4 g/dL (ref 30.0–36.0)
MCV: 89 fL (ref 80.0–100.0)
Monocytes Absolute: 0.7 K/uL (ref 0.1–1.0)
Monocytes Relative: 10 %
Neutro Abs: 3.1 K/uL (ref 1.7–7.7)
Neutrophils Relative %: 49 %
Platelet Count: 252 K/uL (ref 150–400)
RBC: 4.1 MIL/uL — ABNORMAL LOW (ref 4.22–5.81)
RDW: 13.1 % (ref 11.5–15.5)
WBC Count: 6.4 K/uL (ref 4.0–10.5)
nRBC: 0 % (ref 0.0–0.2)

## 2024-01-08 LAB — CMP (CANCER CENTER ONLY)
ALT: 14 U/L (ref 0–44)
AST: 19 U/L (ref 15–41)
Albumin: 3.9 g/dL (ref 3.5–5.0)
Alkaline Phosphatase: 63 U/L (ref 38–126)
Anion gap: 8 (ref 5–15)
BUN: 23 mg/dL (ref 8–23)
CO2: 24 mmol/L (ref 22–32)
Calcium: 9 mg/dL (ref 8.9–10.3)
Chloride: 106 mmol/L (ref 98–111)
Creatinine: 1.96 mg/dL — ABNORMAL HIGH (ref 0.61–1.24)
GFR, Estimated: 36 mL/min — ABNORMAL LOW (ref >60)
Glucose, Bld: 201 mg/dL — ABNORMAL HIGH (ref 70–99)
Potassium: 3.7 mmol/L (ref 3.5–5.1)
Sodium: 138 mmol/L (ref 135–145)
Total Bilirubin: 0.5 mg/dL (ref 0.0–1.2)
Total Protein: 8.2 g/dL — ABNORMAL HIGH (ref 6.5–8.1)

## 2024-01-08 MED ORDER — SODIUM CHLORIDE 0.9% FLUSH
10.0000 mL | Freq: Once | INTRAVENOUS | Status: AC
Start: 2024-01-08 — End: 2024-01-08
  Administered 2024-01-08: 10 mL

## 2024-01-12 ENCOUNTER — Encounter (HOSPITAL_COMMUNITY): Payer: Self-pay

## 2024-01-12 ENCOUNTER — Ambulatory Visit (HOSPITAL_COMMUNITY)
Admission: RE | Admit: 2024-01-12 | Discharge: 2024-01-12 | Disposition: A | Discharge status: Home / Self Care | Source: Ambulatory Visit | Attending: Internal Medicine | Admitting: Internal Medicine

## 2024-01-12 DIAGNOSIS — C3490 Malignant neoplasm of unspecified part of unspecified bronchus or lung: Secondary | ICD-10-CM | POA: Insufficient documentation

## 2024-01-14 ENCOUNTER — Ambulatory Visit (INDEPENDENT_AMBULATORY_CARE_PROVIDER_SITE_OTHER): Payer: Self-pay | Admitting: Nurse Practitioner

## 2024-01-14 ENCOUNTER — Encounter: Payer: Self-pay | Admitting: Nurse Practitioner

## 2024-01-14 VITALS — BP 130/59 | HR 75 | Temp 97.7°F | Wt 217.0 lb

## 2024-01-14 DIAGNOSIS — M545 Low back pain, unspecified: Secondary | ICD-10-CM | POA: Diagnosis not present

## 2024-01-14 DIAGNOSIS — E1151 Type 2 diabetes mellitus with diabetic peripheral angiopathy without gangrene: Secondary | ICD-10-CM | POA: Diagnosis not present

## 2024-01-14 DIAGNOSIS — G8929 Other chronic pain: Secondary | ICD-10-CM | POA: Diagnosis not present

## 2024-01-14 LAB — POCT GLYCOSYLATED HEMOGLOBIN (HGB A1C): Hemoglobin A1C: 7.3 % — AB (ref 4.0–5.6)

## 2024-01-14 MED ORDER — TIZANIDINE HCL 4 MG PO TABS: 4.0000 mg | ORAL_TABLET | Freq: Four times a day (QID) | ORAL | 0 refills | Status: AC | PRN

## 2024-01-14 MED ORDER — CENTRUM ADULTS PO TABS: 1.0000 | ORAL_TABLET | Freq: Every day | ORAL | 1 refills | Status: AC

## 2024-01-14 NOTE — Progress Notes (Signed)
 Subjective   Patient ID: ANIAS BARTOL, male    DOB: 1955-11-18, 68 y.o.   MRN: [id number]  Chief Complaint  Patient presents with   Medical Management of Chronic Issues    Follow up    Referring provider: Oley Kirk RAMAN, NP  Kirk [name] is a 68 y.o. male with Past Medical History: No date: Arthritis     Comment:  right knee (09/04/2015) 04/19/2019: Atrial flutter with rapid ventricular response (HCC) 10/2019: Dizziness No date: Dyspnea     Comment:  increased exertion 07/2020: Elevated serum creatinine No date: HTN (hypertension) No date: Hyperlipidemia No date: Kidney disease     Comment:  stage 3B 05/2019: Lung cancer (HCC) 07/2020: Microalbuminuria 09/04/2015: NSTEMI (non-ST elevated myocardial infarction) (HCC)     Comment:  a. cath 09/05/2015: 95% stenosis mid-LAD, 55% mid-RCA,               and 40% prox-RCA. PCI performed w/ Synergy DES to LAD No date: PAD (peripheral artery disease) (HCC)     Comment:  Stenting of bilateral iliacs in 2003. No date: Refusal of blood transfusions as patient is Jehovah's Witness No date: Type II diabetes mellitus (HCC) 07/2020: Vitamin D  deficiency   HPI  Diagnosed and treated for stage 4 lung cancer. States that he completed six rounds of chemotherapy and oral therapy. Was informed by oncologist that cancer is stable, but not in remission. Completes follow up every 3 months for reevaluation of lung cancer. lung cancer still stable. Also, followed by nephrology.      Diabetes Mellitus: Patient presents for follow up of diabetes. Symptoms: none.  Patient denies foot ulcerations, hypoglycemia , nausea, and polydipsia.  Evaluation to date has been included: hemoglobin A1C.  Home sugars: BG. Treatment to date: no recent interventions. Denies monitoring meals, but walks his dog daily. Hgb A1c 7.3. Is compliant with medications.  Although he is currently having issues getting Jardiance  filled due to insurance.  Patient does follow  with nephrology.      Denies any other concerns today. Denies any fatigue, chest pain, shortness of breath, HA or dizziness. Denies any blurred vision, numbness or tingling.     Allergies  Allergen Reactions   Bee Venom Hives   Seasonal Ic [Octacosanol]     Immunization History  Administered Date(s) Administered   Fluad Quad(high Dose 65+) 03/21/2020, 03/15/2021, 03/08/2022   Fluad Trivalent(High Dose 65+) 03/18/2023   Influenza, High Dose Seasonal PF 03/22/2019   PFIZER(Purple Top)SARS-COV-2 Vaccination 01/18/2020, 07/05/2020, 08/02/2020, 09/27/2020   Pneumococcal Conjugate-13 03/22/2019    Tobacco History: Social History   Tobacco Use  Smoking Status Former   Current packs/day: 0.00   Average packs/day: 2.0 packs/day for 35.0 years (70.0 ttl pk-yrs)   Types: Cigarettes   Start date: 02/19/1968   Quit date: 02/19/2003   Years since quitting: 20.9  Smokeless Tobacco Never   Counseling given: Not Answered   Outpatient Encounter Medications as of 01/14/2024  Medication Sig   acetaminophen  (TYLENOL ) 500 MG tablet Take 500 mg by mouth every 6 (six) hours as needed for moderate pain or fever.    albuterol  (VENTOLIN  HFA) 108 (90 Base) MCG/ACT inhaler Inhale 2 puffs into the lungs every 6 (six) hours as needed for wheezing or shortness of breath.   aspirin  EC 81 MG tablet Take 81 mg by mouth at bedtime.   Blood Glucose Monitoring Suppl (ACCU-CHEK GUIDE) w/Device KIT USE UP TO FOUR TIMES DAILY AS DIRECTED. (FOR ICD-10 E10.9, E11.9).  carvedilol  (COREG ) 12.5 MG tablet TAKE 1 TABLET (12.5MG  TOTAL) BY MOUTH TWICE A DAY WITH MEALS   clopidogrel  (PLAVIX ) 75 MG tablet TAKE 1 TABLET BY MOUTH EVERY DAY   diphenhydrAMINE  (BENADRYL ) 25 mg capsule Take 50 mg by mouth every 6 (six) hours as needed.   docusate sodium  (COLACE) 100 MG capsule TAKE 1 CAPSULE (100 MG TOTAL) BY MOUTH 2 (TWO) TIMES DAILY AS NEEDED FOR MILD CONSTIPATION.   EPINEPHrine  0.3 mg/0.3 mL IJ SOAJ injection Inject 0.3 mg  into the muscle as needed for anaphylaxis.   gabapentin  (NEURONTIN ) 300 MG capsule TAKE 1 CAPSULE BY MOUTH THREE TIMES A DAY   glimepiride  (AMARYL ) 2 MG tablet TAKE 1 TABLET BY MOUTH DAILY WITH BREAKFAST   Glucose Blood (BLOOD GLUCOSE TEST STRIPS) STRP 1 each by In Vitro route 3 (three) times daily with meals. May substitute to any manufacturer covered by patient's insurance.   metFORMIN  (GLUCOPHAGE -XR) 500 MG 24 hr tablet Take 2 tablets (1,000 mg total) by mouth 2 (two) times daily with a meal.   Multiple Vitamins-Minerals (CENTRUM ADULTS) TABS Take 1 tablet by mouth daily.   nitroGLYCERIN  (NITROSTAT ) 0.4 MG SL tablet Place 1 tablet (0.4 mg total) under the tongue every 5 (five) minutes x 3 doses as needed for chest pain.   rosuvastatin  (CRESTOR ) 40 MG tablet TAKE 1 TABLET BY MOUTH EVERY DAY   tiZANidine  (ZANAFLEX ) 4 MG tablet Take 1 tablet (4 mg total) by mouth every 6 (six) hours as needed for muscle spasms.   Vitamin D , Ergocalciferol , (DRISDOL ) 1.25 MG (50000 UNIT) CAPS capsule TAKE 1 CAPSULE (50,000 UNITS TOTAL) BY MOUTH EVERY 7 (SEVEN) DAYS   Misc. Devices (ROLLATOR ULTRA-LIGHT) MISC 1 each by Does not apply route daily. (Patient not taking: Reported on 10/13/2023)   traZODone  (DESYREL ) 50 MG tablet TAKE 0.5-1 TABLETS BY MOUTH AT BEDTIME AS NEEDED FOR SLEEP. (Patient not taking: Reported on 10/13/2023)   No facility-administered encounter medications on file as of 01/14/2024.    Review of Systems  Review of Systems  Constitutional: Negative.   HENT: Negative.    Cardiovascular: Negative.   Gastrointestinal: Negative.   Allergic/Immunologic: Negative.   Neurological: Negative.   Psychiatric/Behavioral: Negative.       Objective:   BP (!) 130/59   Pulse 75   Temp 97.7 F (36.5 C) (Oral)   Wt 217 lb (98.4 kg)   SpO2 99%   BMI 27.12 kg/m   Wt Readings from Last 5 Encounters:  01/14/24 217 lb (98.4 kg)  12/04/23 221 lb 5.5 oz (100.4 kg)  10/13/23 221 lb 6.4 oz (100.4 kg)   09/25/23 225 lb (102.1 kg)  07/31/23 220 lb (99.8 kg)     Physical Exam Vitals and nursing note reviewed.  Constitutional:      General: He is not in acute distress.    Appearance: He is well-developed.  Cardiovascular:     Rate and Rhythm: Normal rate and regular rhythm.  Pulmonary:     Effort: Pulmonary effort is normal.     Breath sounds: Normal breath sounds.  Skin:    General: Skin is warm and dry.  Neurological:     Mental Status: He is alert and oriented to person, place, and time.       Assessment & Plan:   Diabetes mellitus with peripheral vascular disease (HCC) -     POCT glycosylated hemoglobin (Hb A1C)  Chronic bilateral low back pain without sciatica -     tiZANidine  HCl; Take 1  tablet (4 mg total) by mouth every 6 (six) hours as needed for muscle spasms.  Dispense: 30 tablet; Refill: 0  Other orders -     Centrum Adults; Take 1 tablet by mouth daily.  Dispense: 90 tablet; Refill: 1     Return in about 3 months (around 04/15/2024).     Kirk GORMAN Borer, NP 01/14/2024

## 2024-01-15 ENCOUNTER — Inpatient Hospital Stay (HOSPITAL_BASED_OUTPATIENT_CLINIC_OR_DEPARTMENT_OTHER): Payer: Medicare Other | Admitting: Internal Medicine

## 2024-01-15 VITALS — BP 136/62 | HR 83 | Temp 98.2°F | Resp 17 | Ht 75.0 in | Wt 219.0 lb

## 2024-01-15 DIAGNOSIS — C3490 Malignant neoplasm of unspecified part of unspecified bronchus or lung: Secondary | ICD-10-CM | POA: Diagnosis not present

## 2024-01-15 DIAGNOSIS — Z08 Encounter for follow-up examination after completed treatment for malignant neoplasm: Secondary | ICD-10-CM | POA: Diagnosis not present

## 2024-01-15 NOTE — Progress Notes (Signed)
 [facility] Telephone:([phone]) [phone]   Fax:([phone]) [phone]  OFFICE PROGRESS NOTE  Kirk Bascom RAMAN, NP [address] [place] [address]  DIAGNOSIS: Stage IV (TX, N3, M1c) non-small cell lung cancer, poorly differentiated adenocarcinoma presented with bulky mediastinal lymphadenopathy in addition to right axillary lymphadenopathy and metastatic liver lesions, abdominal lymphadenopathy diagnosed in January 2021. Molecular studies by Guardant 360 that shows no actionable mutations.  PRIOR THERAPY: Systemic chemotherapy with carboplatin  for AUC of 5, Alimta  500 mg/M2 and Keytruda  200 mg IV every 3 weeks.  First dose July 14, 2019.  Status post 35  cycles. Starting from cycle #5 the patient will be treated with maintenance Alimta  and Keytruda  every 3 weeks.  Starting from cycle #12 he will be on single agent treatment with Keytruda  secondary to renal insufficiency.  CURRENT THERAPY: Observation  INTERVAL HISTORY: Kirk [name] 68 y.o. male returns to the clinic today for 4-month follow-up visit. Discussed the use of AI scribe software for clinical note transcription with the patient, who gave verbal consent to proceed.  History of Present Illness Kirk [name] is a 68 year old male with adenocarcinoma who presents for evaluation with repeat CT scan for restaging of his disease.  He is currently on observation after completing thirty-five cycles of Keytruda , with the last cycle being the twelfth. He is here for a repeat CT scan of the chest, abdomen, and pelvis to restage his disease.  He experiences chronic back pain but has not consulted an orthopedic surgeon or any specialist for this issue. The pain is persistent but has been self-managed.  He mentions a vein issue from a previous infusion and something on his head, but he does not express significant concern about these findings. No recent changes or worsening of these symptoms.     MEDICAL  HISTORY: Past Medical History:  Diagnosis Date   Arthritis    right knee (09/04/2015)   Atrial flutter with rapid ventricular response (HCC) 04/19/2019   Dizziness 10/2019   Dyspnea    increased exertion   Elevated serum creatinine 07/2020   HTN (hypertension)    Hyperlipidemia    Kidney disease    stage 3B   Lung cancer (HCC) 05/2019   Microalbuminuria 07/2020   NSTEMI (non-ST elevated myocardial infarction) (HCC) 09/04/2015   a. cath 09/05/2015: 95% stenosis mid-LAD, 55% mid-RCA, and 40% prox-RCA. PCI performed w/ Synergy DES to LAD   PAD (peripheral artery disease) (HCC)    Stenting of bilateral iliacs in 2003.   Refusal of blood transfusions as patient is Jehovah's Witness    Type II diabetes mellitus (HCC)    Vitamin D  deficiency 07/2020    ALLERGIES:  is allergic to bee venom and seasonal ic [octacosanol].  MEDICATIONS:  Current Outpatient Medications  Medication Sig Dispense Refill   acetaminophen  (TYLENOL ) 500 MG tablet Take 500 mg by mouth every 6 (six) hours as needed for moderate pain or fever.      albuterol  (VENTOLIN  HFA) 108 (90 Base) MCG/ACT inhaler Inhale 2 puffs into the lungs every 6 (six) hours as needed for wheezing or shortness of breath. 8 g 0   aspirin  EC 81 MG tablet Take 81 mg by mouth at bedtime.     Blood Glucose Monitoring Suppl (ACCU-CHEK GUIDE) w/Device KIT USE UP TO FOUR TIMES DAILY AS DIRECTED. (FOR ICD-10 E10.9, E11.9). 1 kit 0   carvedilol  (COREG ) 12.5 MG tablet TAKE 1 TABLET (12.5MG  TOTAL) BY  MOUTH TWICE A DAY WITH MEALS 180 tablet 1   clopidogrel  (PLAVIX ) 75 MG tablet TAKE 1 TABLET BY MOUTH EVERY DAY 90 tablet 1   diphenhydrAMINE  (BENADRYL ) 25 mg capsule Take 50 mg by mouth every 6 (six) hours as needed.     docusate sodium  (COLACE) 100 MG capsule TAKE 1 CAPSULE (100 MG TOTAL) BY MOUTH 2 (TWO) TIMES DAILY AS NEEDED FOR MILD CONSTIPATION. 180 capsule 3   EPINEPHrine  0.3 mg/0.3 mL IJ SOAJ injection Inject 0.3 mg into the muscle as needed for  anaphylaxis. 1 each 0   gabapentin  (NEURONTIN ) 300 MG capsule TAKE 1 CAPSULE BY MOUTH THREE TIMES A DAY 270 capsule 3   glimepiride  (AMARYL ) 2 MG tablet TAKE 1 TABLET BY MOUTH DAILY WITH BREAKFAST 90 tablet 2   Glucose Blood (BLOOD GLUCOSE TEST STRIPS) STRP 1 each by In Vitro route 3 (three) times daily with meals. May substitute to any manufacturer covered by patient's insurance. 100 strip 3   metFORMIN  (GLUCOPHAGE -XR) 500 MG 24 hr tablet Take 2 tablets (1,000 mg total) by mouth 2 (two) times daily with a meal. 180 tablet 3   Misc. Devices (ROLLATOR ULTRA-LIGHT) MISC 1 each by Does not apply route daily. (Patient not taking: Reported on 10/13/2023) 1 each 0   Multiple Vitamins-Minerals (CENTRUM ADULTS) TABS Take 1 tablet by mouth daily. 90 tablet 1   nitroGLYCERIN  (NITROSTAT ) 0.4 MG SL tablet Place 1 tablet (0.4 mg total) under the tongue every 5 (five) minutes x 3 doses as needed for chest pain. 25 tablet 2   rosuvastatin  (CRESTOR ) 40 MG tablet TAKE 1 TABLET BY MOUTH EVERY DAY 90 tablet 3   tiZANidine  (ZANAFLEX ) 4 MG tablet Take 1 tablet (4 mg total) by mouth every 6 (six) hours as needed for muscle spasms. 30 tablet 0   traZODone  (DESYREL ) 50 MG tablet TAKE 0.5-1 TABLETS BY MOUTH AT BEDTIME AS NEEDED FOR SLEEP. (Patient not taking: Reported on 10/13/2023) 90 tablet 2   Vitamin D , Ergocalciferol , (DRISDOL ) 1.25 MG (50000 UNIT) CAPS capsule TAKE 1 CAPSULE (50,000 UNITS TOTAL) BY MOUTH EVERY 7 (SEVEN) DAYS 5 capsule 2   No current facility-administered medications for this visit.    SURGICAL HISTORY:  Past Surgical History:  Procedure Laterality Date   CARDIAC CATHETERIZATION N/A 09/05/2015   Procedure: Left Heart Cath and Coronary Angiography;  Surgeon: Debby DELENA Sor, MD;  Location: [facility];  Service: Cardiovascular;  Laterality: N/A;   CARDIAC CATHETERIZATION N/A 09/05/2015   Procedure: Coronary Stent Intervention;  Surgeon: Debby DELENA Sor, MD;  Location: [facility]AS[facility] CV [facility];  Service:  Cardiovascular;  Laterality: N/A;   IR IMAGING GUIDED PORT INSERTION  07/15/2019   ORIF CONGENITAL HIP DISLOCATION Left ~ 1965   put pins in it   PERIPHERAL VASCULAR CATHETERIZATION Bilateral 2003   stenting   TONSILLECTOMY  ~ 1963    REVIEW OF SYSTEMS:  A comprehensive review of systems was negative.   PHYSICAL EXAMINATION: General appearance: alert, cooperative, appears stated age, and no distress Head: Normocephalic, without obvious abnormality, atraumatic Neck: no adenopathy, no JVD, supple, symmetrical, trachea midline, and thyroid  not enlarged, symmetric, no tenderness/mass/nodules Lymph nodes: Cervical, supraclavicular, and axillary nodes normal. Resp: clear to auscultation bilaterally Back: symmetric, no curvature. ROM normal. No CVA tenderness. Cardio: regular rate and rhythm, S1, S2 normal, no murmur, click, rub or gallop GI: soft, non-tender; bowel sounds normal; no masses,  no organomegaly Extremities: extremities normal, atraumatic, no cyanosis or edema  ECOG PERFORMANCE STATUS: 1 -  Symptomatic but completely ambulatory  Blood pressure 136/62, pulse 83, temperature 98.2 F (36.8 C), temperature source Temporal, resp. rate 17, height 6' 3 (1.905 m), weight 219 lb (99.3 kg), SpO2 99%.  LABORATORY DATA: Lab Results  Component Value Date   WBC 6.4 01/08/2024   HGB 12.2 (L) 01/08/2024   HCT 36.5 (L) 01/08/2024   MCV 89.0 01/08/2024   PLT 252 01/08/2024      Chemistry      Component Value Date/Time   NA 138 01/08/2024 1532   NA 140 06/08/2015 0840   K 3.7 01/08/2024 1532   CL 106 01/08/2024 1532   CO2 24 01/08/2024 1532   BUN 23 01/08/2024 1532   BUN 15 06/08/2015 0840   CREATININE 1.96 (H) 01/08/2024 1532   CREATININE 1.06 10/01/2016 0855      Component Value Date/Time   CALCIUM  9.0 01/08/2024 1532   ALKPHOS 63 01/08/2024 1532   AST 19 01/08/2024 1532   ALT 14 01/08/2024 1532   BILITOT 0.5 01/08/2024 1532       RADIOGRAPHIC STUDIES: CT Chest Wo  Contrast Result Date: 01/14/2024 CLINICAL DATA:  Non-small cell lung cancer diagnosed 2022 status post chemotherapy and Keytruda . Follow-up. * Tracking Code: BO * 2 months of lower back pain. EXAM: CT CHEST, ABDOMEN AND PELVIS WITHOUT CONTRAST TECHNIQUE: Multidetector CT imaging of the chest, abdomen and pelvis was performed following the standard protocol without IV contrast. RADIATION DOSE REDUCTION: This exam was performed according to the departmental dose-optimization program which includes automated exposure control, adjustment of the mA and/or kV according to patient size and/or use of iterative reconstruction technique. COMPARISON:  Multiple priors including CT July 14, 2023 FINDINGS: CT CHEST FINDINGS Cardiovascular: Right chest Port-A-Cath with tip near the superior cavoatrial junction. Three-vessel coronary artery calcifications. Normal size heart. Small pericardial effusion. Aortic atherosclerosis. Mediastinum/Nodes: No suspicious thyroid  nodule. No pathologically enlarged mediastinal, hilar or axillary lymph nodes noting limited evaluation of the hilar structures on noncontrast enhanced examination. Patulous esophagus. Lungs/Pleura: No suspicious pulmonary nodules or masses. Centrilobular emphysema.  Scattered scarring.  No pleural effusion. Central airways are patent. Musculoskeletal: No aggressive lytic or blastic lesion of bone. Multilevel degenerative changes spine. CT ABDOMEN PELVIS FINDINGS Hepatobiliary: Stable hypodense lesion in segment V on image 73/2 previously characterized as a benign hepatic hemangioma. No new suspicious hepatic lesion on noncontrast enhanced examination. Gallbladder is unremarkable. No biliary ductal dilation. Pancreas: No pancreatic ductal dilation or evidence of acute inflammation. Spleen: No splenomegaly. Adrenals/Urinary Tract: No suspicious adrenal nodule/mass. No hydronephrosis. No renal, ureteral or bladder calculi. Urinary bladder is unremarkable for degree  of distension. Stomach/Bowel: Stomach is unremarkable for degree of distension. No pathologic dilation of small or large bowel. Normal appendix. Vascular/Lymphatic: Aortic and branch vessel atherosclerosis. No pathologically enlarged abdominal or pelvic lymph nodes. Reproductive: Prostate is unremarkable. Other: No significant abdominopelvic free fluid. Musculoskeletal: No aggressive lytic or blastic lesion of bone. Multilevel degenerative changes spine. Fixation screws in the proximal left femur. Degenerative change of the bilateral hips. IMPRESSION: 1. Stable examination without evidence of recurrent or metastatic disease in the chest, abdomen or pelvis on this noncontrast enhanced examination. 2. Aortic Atherosclerosis (ICD10-I70.0) and Emphysema (ICD10-J43.9). Electronically Signed   By: Reyes Holder M.D.   On: 01/14/2024 16:09   CT ABDOMEN PELVIS WO CONTRAST Result Date: 01/14/2024 CLINICAL DATA:  Non-small cell lung cancer diagnosed 2022 status post chemotherapy and Keytruda . Follow-up. * Tracking Code: BO * 2 months of lower back pain. EXAM: CT CHEST, ABDOMEN AND  PELVIS WITHOUT CONTRAST TECHNIQUE: Multidetector CT imaging of the chest, abdomen and pelvis was performed following the standard protocol without IV contrast. RADIATION DOSE REDUCTION: This exam was performed according to the departmental dose-optimization program which includes automated exposure control, adjustment of the mA and/or kV according to patient size and/or use of iterative reconstruction technique. COMPARISON:  Multiple priors including CT July 14, 2023 FINDINGS: CT CHEST FINDINGS Cardiovascular: Right chest Port-A-Cath with tip near the superior cavoatrial junction. Three-vessel coronary artery calcifications. Normal size heart. Small pericardial effusion. Aortic atherosclerosis. Mediastinum/Nodes: No suspicious thyroid  nodule. No pathologically enlarged mediastinal, hilar or axillary lymph nodes noting limited evaluation of  the hilar structures on noncontrast enhanced examination. Patulous esophagus. Lungs/Pleura: No suspicious pulmonary nodules or masses. Centrilobular emphysema.  Scattered scarring.  No pleural effusion. Central airways are patent. Musculoskeletal: No aggressive lytic or blastic lesion of bone. Multilevel degenerative changes spine. CT ABDOMEN PELVIS FINDINGS Hepatobiliary: Stable hypodense lesion in segment V on image 73/2 previously characterized as a benign hepatic hemangioma. No new suspicious hepatic lesion on noncontrast enhanced examination. Gallbladder is unremarkable. No biliary ductal dilation. Pancreas: No pancreatic ductal dilation or evidence of acute inflammation. Spleen: No splenomegaly. Adrenals/Urinary Tract: No suspicious adrenal nodule/mass. No hydronephrosis. No renal, ureteral or bladder calculi. Urinary bladder is unremarkable for degree of distension. Stomach/Bowel: Stomach is unremarkable for degree of distension. No pathologic dilation of small or large bowel. Normal appendix. Vascular/Lymphatic: Aortic and branch vessel atherosclerosis. No pathologically enlarged abdominal or pelvic lymph nodes. Reproductive: Prostate is unremarkable. Other: No significant abdominopelvic free fluid. Musculoskeletal: No aggressive lytic or blastic lesion of bone. Multilevel degenerative changes spine. Fixation screws in the proximal left femur. Degenerative change of the bilateral hips. IMPRESSION: 1. Stable examination without evidence of recurrent or metastatic disease in the chest, abdomen or pelvis on this noncontrast enhanced examination. 2. Aortic Atherosclerosis (ICD10-I70.0) and Emphysema (ICD10-J43.9). Electronically Signed   By: Reyes Holder M.D.   On: 01/14/2024 16:09    ASSESSMENT AND PLAN: This is a very pleasant 68 years old African-American male recently diagnosed with a stage IV (TX, N3, M1c) non-small cell lung cancer, poorly differentiated adenocarcinoma presented with bulky mediastinal  as well as right hilar, supraclavicular, abdominal and right axillary lymphadenopathy in addition to metastatic liver lesions diagnosed in January 2021. Molecular studies by guardant 360 shows no actionable mutations.  The patient is not a candidate for treatment with targeted therapy or enrollment in the clinical trial with the KEAPSAKE regimen. The patient is currently undergoing systemic chemotherapy with carboplatin  for AUC of 5, Alimta  500 mg/M2 and Keytruda  200 mg IV every 3 weeks status post 35 cycles. Starting from cycle #5 he is treated with maintenance Alimta  and Keytruda  every 3 weeks.  Starting from cycle #12 the patient has been on treatment with single agent Keytruda  because of the renal insufficiency. The patient completed 2 years of treatment with immunotherapy. The patient is currently on observation and he is feeling fine with no concerning complaints.   He had repeat CT scan of the chest, abdomen and pelvis performed recently.  I personally and independently reviewed the scan and discussed the results with the patient today.  His scan showed no concerning findings for disease progression. Assessment and Plan Assessment & Plan Adenocarcinoma, currently under observation Adenocarcinoma under observation following completion of 35 cycles of Keytruda . Recent CT scan of the chest, abdomen, and pelvis shows no concerning findings. Disease appears stable. - Continue observation - Schedule follow-up in six months - Instruct  to report any worsening symptoms, particularly in the head or arm area  Anemia Mild anemia noted on recent lab results.  For the anemia, he will continue with the oral iron supplement for now. For the renal insufficiency he is followed by nephrology. The patient was advised to call immediately if he has any concerning symptoms in the interval. The patient voices understanding of current disease status and treatment options and is in agreement with the current care  plan.  All questions were answered. The patient knows to call the clinic with any problems, questions or concerns. We can certainly see the patient much sooner if necessary.  Disclaimer: This note was dictated with voice recognition software. Similar sounding words can inadvertently be transcribed and may not be corrected upon review.

## 2024-01-16 ENCOUNTER — Encounter: Payer: Self-pay | Admitting: Internal Medicine

## 2024-01-20 ENCOUNTER — Telehealth: Payer: Self-pay | Admitting: Internal Medicine

## 2024-01-20 NOTE — Telephone Encounter (Signed)
 Scheduled appointments. Spoke with the patient he is aware of appointment days and times.

## 2024-01-28 ENCOUNTER — Other Ambulatory Visit: Payer: Self-pay | Admitting: Nurse Practitioner

## 2024-01-28 DIAGNOSIS — K5900 Constipation, unspecified: Secondary | ICD-10-CM

## 2024-02-09 ENCOUNTER — Other Ambulatory Visit: Payer: Self-pay

## 2024-02-09 ENCOUNTER — Telehealth: Payer: Self-pay

## 2024-02-09 NOTE — Progress Notes (Signed)
 Attempted to contact patient for scheduled appointment for medication management. Left HIPAA compliant message for patient to return my call at their convenience.   Lorain Baseman, PharmD [facility] [phone]

## 2024-02-09 NOTE — Progress Notes (Unsigned)
 02/09/2024 Name: Kirk [name] MRN: [id number] DOB: 28-Jun-1955  No chief complaint on file.   SCHYLAR [name] is a 68 y.o. year old male who presented for a telephone visit.   They were referred to the pharmacist by their PCP for assistance in managing diabetes. PMH includes HTN, PAD (bilateral ilac stents), T2DM, CAD s/p NSTEMI (2017 - multivessel CAD with 95% stenosis in the mid-LAD, 55% mid-RCA, and 40% prox-RCA. Successful PCI was performed with a Synergy DES to the LAD ), A flutter with RVR (stopped Eliquis  2/2 cost and does not wish to restart in the setting of metastatic cancer), adenocarcinoma of R lung (metastatic, stage 4, diagnosed Jan 2021) s/p chemo - currently stable, HLD.   Subjective: Patient was last engaged by Heather Factor, PharmD on 08/19/23. He reported that BG had improved since increasing metformin  XR to 1000 mg BID. He had continued glimepiride  2 mg daily. He was recently seen by neurology for worsening dizziness. Brain MRI did not show any acute findings. He was seen by PCP, Bascom Borer, NP on 10/13/23 - It was recommended that he continuing following with nephrology for elevated UACR.   Today, patient reports that he is doing wel.   eGFR - metformin ?  Care Team: Primary Care Provider: Borer Bascom RAMAN, NP ; Next Scheduled Visit: 01/14/24 Oncology: Sherrod Sherrod, MD: Scheduled 01/15/24  Medication Access/Adherence  Current Pharmacy:  [facility] [phone] [address], [place] - [address] [place] [address] Phone: [phone] Fax: [phone]   Patient reports affordability concerns with their medications: Yes  - Jardiance  test claim today $556 for 30ds (due to deductible + coinsurance). Patient reports access/transportation concerns to their pharmacy: No  Patient reports adherence concerns with their medications:  No    Jardiance  not filled since 01/03/23 for 90 ds  Patient is planning to change his insurance plan at open  enrollment through his work (this fall - new plan would be active Jan 2026).   Diabetes:  Current medications: metformin  XR 1000 mg twice daily, glimepiride  2 mg daily  Medications tried in the past: Jardiance  25 mg daily (discontinued due to financial barriers)   Current glucose readings:  Using Accu Chek meter; testing 1 times daily - patient does not have meter with him to go through readings.   Patient denies hypoglycemic s/sx including  shakiness, sweating. Endorses dizziness - but this is usual for him s/p chemo. He has checked his BG during dizzy spells before and the lowest he has seen is 80 mg/dL. Denies BG < 70 mg/dL.  Patient denies hyperglycemic symptoms including polyuria, polydipsia, polyphagia, blurred vision. He has stable nocturia and neuropathy s/p chemotherapy.   Current meal patterns: Eating more vegetables, cut out on sweets - feels that changes in diet are biggest reason for decrease in A1C. - Drinks: mostly water during the day, occasional tea or soda  Current physical activity: Walks dog once daily.   Current medication access support: none - he reports that his income is > 300% [employer] required for [employer]Me or [employer] C[employer] PAP programs. He is not interested in setting up the Medicare Payment Plan at this time.   Objective:  BP Readings from Last 3 Encounters:  01/15/24 136/62  01/14/24 (!) 130/59  12/04/23 129/73   UACR 10/13/23: 261 mg/g   Lab Results  Component Value Date   HGBA1C 7.3 (A) 01/14/2024   HGBA1C 7.7 (A) 10/13/2023   HGBA1C 9.3 (H) 07/22/2023    Lab  Results  Component Value Date   CREATININE 1.96 (H) 01/08/2024   BUN 23 01/08/2024   NA 138 01/08/2024   K 3.7 01/08/2024   CL 106 01/08/2024   CO2 24 01/08/2024   eGFR 07/14/23 > 60 mL/min (previous 51 mL/min)  Lab Results  Component Value Date   CHOL 124 07/11/2022   HDL 30 (L) 07/11/2022   LDLCALC 61 07/11/2022   TRIG 195 (H) 07/11/2022   CHOLHDL 4.1 07/11/2022    Medications Reviewed  Today   Medications were not reviewed in this encounter     Assessment/Plan:   Diabetes: - Currently controlled with last A1C of 7.7% below goal < 8% given co-morbidities (Stage IV lung cancer, currently stable), but much improved from 9.3%. Patient feels improvement is largely due to changes in diet. Commended his efforts and encouraged him to keep up the good work. There are limited options for escalation in pharmacotherapy at this point, as he will not be able to afford any brand name medications with his current insurance plan, but also is not eligible for manufacturer assistance. He is not having symptomatic hyper or hypoglycemia, but is aware of hypoglycemia management plan. Do not feel that he needs to start insulin  at this point. I agree that he should be on an SGLT2i for kidney protection with last UACR >200 mg/g, however it is reasonable to wait to initiate this until he has a different insurance plan. Encouraged him to follow up with nephrology as scheduled.  - Reviewed long term cardiovascular and renal outcomes of uncontrolled blood sugar - Reviewed goal A1c, goal fasting, and goal 2 hour post prandial glucose - Reviewed dietary modifications including limited intake of carbohydrate-heavy foods, increasing protein intake, increasing water intake. - Reviewed lifestyle modifications including: increasing physical activity. - Recommend to continue metformin  XR 1000 mg twice daily, glimepiride  2 mg daily. Monitor renal function closely as metformin  dose reduction may be warranted in the future.   - Recommend to check glucose once daily fasting - Next A1C and repeat lipid panel due August 2025.    Follow Up Plan: PCP 01/14/24, Pharmacist telephone   Lorain Baseman, PharmD [facility] [phone]

## 2024-02-15 ENCOUNTER — Other Ambulatory Visit: Payer: Self-pay | Admitting: Nurse Practitioner

## 2024-02-15 DIAGNOSIS — I2510 Atherosclerotic heart disease of native coronary artery without angina pectoris: Secondary | ICD-10-CM

## 2024-02-15 DIAGNOSIS — I10 Essential (primary) hypertension: Secondary | ICD-10-CM

## 2024-02-15 DIAGNOSIS — I517 Cardiomegaly: Secondary | ICD-10-CM

## 2024-02-26 NOTE — Progress Notes (Unsigned)
 Office Visit    Patient Name: Kirk [name] Date of Encounter: 02/27/2024  PCP:  Oley Bascom RAMAN, NP   [facility]  Cardiologist:  Lonni Cash, MD  Advanced Practice Provider:  Lucien Orren SAILOR, PA-C Electrophysiologist:  None   HPI    Kirk [name] is a 68 y.o. male with past medical history of CAD, PAD, HLD, HTN, paroxysmal atrial flutter and DM presents today for annual follow-up visit.  He has a history of PAD status post bilateral lower extremity stenting in 2003.  ABI March 2019 stable.  Admitted to [facility] April 2017 with NSTEMI.  Cardiac cath/11/17 with high-grade mid LAD stenosis treated with DES x 1.  There was mild to moderate nonobstructive disease in the circumflex and RCA.  Echo April 2017 with normal LV systolic function.  Seen in our office April 2019 with complaint of dyspnea.  Nuclear stress test April 2019 with no ischemia.  Admitted to [facility] November 2020 with atrial flutter.  Echo November 2020 with LVEF 66 5%, no valve disease.  Started on Eliquis .  CT of the chest December 2020 with multiple lung lesions.  He has been since diagnosed with nonsmall cell lung cancer and is on chemotherapy.  Stopped Eliquis  due to cost and does not wish to restart.  He does not wish to start Coumadin.  Last seen 06/2021 and denied chest pain, dyspnea, palpitations, lower extremity edema, orthopnea, PND, dizziness, syncope, or near syncope.  He was seen by me February 2024, he states that he has chronic shortness of breath due to lung cancer.  He is stage IV and underwent chemotherapy.  He has a CT scan tomorrow to reevaluate.  He quit smoking pain after 35 years.  He did have some right arm pain about 2 months ago but this subsided.  Thought to be musculoskeletal.  He walks his dogs every day for 20 minutes.  Encouraged to get 30 minutes of exercise most days of the week.  Reports no chest pain, pressure, or tightness. No edema, orthopnea, PND. Reports no  palpitations.   Today, he presents with a history of coronary artery disease and arrhythmia with frequent PVCs.  He experiences frequent premature ventricular contractions, with extra beats noted on his EKG. He takes carvedilol  12.5 mg once daily instead of the prescribed twice daily. He experiences fatigue and occasional dizziness.  He has coronary artery disease and previously had a heart attack, treated with a stent in the LAD artery. He has not experienced similar symptoms recently.  His exercise tolerance has decreased since chemotherapy, leading to shortness of breath with exertion.  Reports no chest pain, pressure, or tightness. No edema, orthopnea, PND. Reports no palpitations.   Discussed the use of AI scribe software for clinical note transcription with the patient, who gave verbal consent to proceed.   Past Medical History    Past Medical History:  Diagnosis Date   Arthritis    right knee (09/04/2015)   Atrial flutter with rapid ventricular response (HCC) 04/19/2019   Dizziness 10/2019   Dyspnea    increased exertion   Elevated serum creatinine 07/2020   HTN (hypertension)    Hyperlipidemia    Kidney disease    stage 3B   Lung cancer (HCC) 05/2019   Microalbuminuria 07/2020   NSTEMI (non-ST elevated myocardial infarction) (HCC) 09/04/2015   a. cath 09/05/2015: 95% stenosis mid-LAD, 55% mid-RCA, and 40% prox-RCA. PCI performed w/ Synergy DES to LAD   PAD (peripheral  artery disease)    Stenting of bilateral iliacs in 2003.   Refusal of blood transfusions as patient is Jehovah's Witness    Type II diabetes mellitus (HCC)    Vitamin D  deficiency 07/2020   Past Surgical History:  Procedure Laterality Date   CARDIAC CATHETERIZATION N/A 09/05/2015   Procedure: Left Heart Cath and Coronary Angiography;  Surgeon: Debby DELENA Sor, MD;  Location: [facility] INVASIVE CV LAB;  Service: Cardiovascular;  Laterality: N/A;   CARDIAC CATHETERIZATION N/A 09/05/2015   Procedure: Coronary  Stent Intervention;  Surgeon: Debby DELENA Sor, MD;  Location: [facility] INVASIVE CV LAB;  Service: Cardiovascular;  Laterality: N/A;   IR IMAGING GUIDED PORT INSERTION  07/15/2019   ORIF CONGENITAL HIP DISLOCATION Left ~ 1965   put pins in it   PERIPHERAL VASCULAR CATHETERIZATION Bilateral 2003   stenting   TONSILLECTOMY  ~ 1963    Allergies  Allergies  Allergen Reactions   Bee Venom Hives   Seasonal Ic [Octacosanol]     EKGs/Labs/Other Studies Reviewed:   The following studies were reviewed today:  Echo 04/20/19 IMPRESSIONS      1. Left ventricular ejection fraction, by visual estimation, is 60 to  65%. The left ventricle has normal function. There is mildly increased  left ventricular hypertrophy.   2. The left ventricle has no regional wall motion abnormalities.   3. Global right ventricle has normal systolic function.The right  ventricular size is normal. No increase in right ventricular wall  thickness.   4. Left atrial size was mildly dilated.   5. Right atrial size was normal.   6. The mitral valve is normal in structure. No evidence of mitral valve  regurgitation. No evidence of mitral stenosis.   7. The tricuspid valve is normal in structure. Tricuspid valve  regurgitation is not demonstrated.   8. The aortic valve is normal in structure. Aortic valve regurgitation is  not visualized. No evidence of aortic valve sclerosis or stenosis.   9. The pulmonic valve was normal in structure. Pulmonic valve  regurgitation is not visualized.  10. TR signal is inadequate for assessing pulmonary artery systolic  pressure.  11. The inferior vena cava is normal in size with greater than 50%  respiratory variability, suggesting right atrial pressure of 3 mmHg.   In comparison to the previous echocardiogram(s): Prior examinations were  reviewed in a side by side comparison of images. No change since 2017.  FINDINGS   Left Ventricle: Left ventricular ejection fraction, by visual  estimation,  is 60 to 65%. The left ventricle has normal function. The left ventricle  has no regional wall motion abnormalities. There is mildly increased left  ventricular hypertrophy.  Concentric left ventricular hypertrophy. Left ventricular diastolic  parameters were normal. Normal left atrial pressure.   Right Ventricle: The right ventricular size is normal. No increase in  right ventricular wall thickness. Global RV systolic function is has  normal systolic function.   Left Atrium: Left atrial size was mildly dilated.   Right Atrium: Right atrial size was normal in size   Pericardium: There is no evidence of pericardial effusion.   Mitral Valve: The mitral valve is normal in structure. No evidence of  mitral valve stenosis by observation. No evidence of mitral valve  regurgitation.   Tricuspid Valve: The tricuspid valve is normal in structure. Tricuspid  valve regurgitation is not demonstrated.   Aortic Valve: The aortic valve is normal in structure. Aortic valve  regurgitation is not visualized. The aortic valve  is structurally normal,  with no evidence of sclerosis or stenosis.   Pulmonic Valve: The pulmonic valve was normal in structure. Pulmonic valve  regurgitation is not visualized.   Aorta: The aortic root, ascending aorta and aortic arch are all  structurally normal, with no evidence of dilitation or obstruction.   Venous: The inferior vena cava is normal in size with greater than 50%  respiratory variability, suggesting right atrial pressure of 3 mmHg.   IAS/Shunts: No atrial level shunt detected by color flow Doppler. No  ventricular septal defect is seen or detected. There is no evidence of an  atrial septal defect.    EKG:  EKG is  ordered today.  The ekg ordered today demonstrates normal sinus rhythm, 88 bpm, first-degree AV block  Recent Labs: 01/08/2024: ALT 14; BUN 23; Creatinine 1.96; Hemoglobin 12.2; Platelet Count 252; Potassium 3.7; Sodium 138   Recent Lipid Panel    Component Value Date/Time   CHOL 124 07/11/2022 1059   TRIG 195 (H) 07/11/2022 1059   HDL 30 (L) 07/11/2022 1059   CHOLHDL 4.1 07/11/2022 1059   CHOLHDL 3.7 03/12/2016 0837   VLDL 17 03/12/2016 0837   LDLCALC 61 07/11/2022 1059    Risk Assessment/Calculations:   CHA2DS2-VASc Score =     This indicates a  % annual risk of stroke. The patient's score is based upon:       Home Medications   Current Meds  Medication Sig   acetaminophen  (TYLENOL ) 500 MG tablet Take 500 mg by mouth every 6 (six) hours as needed for moderate pain or fever.    albuterol  (VENTOLIN  HFA) 108 (90 Base) MCG/ACT inhaler Inhale 2 puffs into the lungs every 6 (six) hours as needed for wheezing or shortness of breath.   aspirin  EC 81 MG tablet Take 81 mg by mouth at bedtime.   Blood Glucose Monitoring Suppl (ACCU-CHEK GUIDE) w/Device KIT USE UP TO FOUR TIMES DAILY AS DIRECTED. (FOR ICD-10 E10.9, E11.9).   clopidogrel  (PLAVIX ) 75 MG tablet TAKE 1 TABLET BY MOUTH EVERY DAY   diphenhydrAMINE  (BENADRYL ) 25 mg capsule Take 50 mg by mouth every 6 (six) hours as needed.   docusate sodium  (COLACE) 100 MG capsule TAKE 1 CAPSULE (100 MG TOTAL) BY MOUTH 2 (TWO) TIMES DAILY AS NEEDED FOR MILD CONSTIPATION.   EPINEPHrine  0.3 mg/0.3 mL IJ SOAJ injection Inject 0.3 mg into the muscle as needed for anaphylaxis.   gabapentin  (NEURONTIN ) 300 MG capsule TAKE 1 CAPSULE BY MOUTH THREE TIMES A DAY   glimepiride  (AMARYL ) 2 MG tablet TAKE 1 TABLET BY MOUTH DAILY WITH BREAKFAST   Glucose Blood (BLOOD GLUCOSE TEST STRIPS) STRP 1 each by In Vitro route 3 (three) times daily with meals. May substitute to any manufacturer covered by patient's insurance.   metFORMIN  (GLUCOPHAGE -XR) 500 MG 24 hr tablet Take 2 tablets (1,000 mg total) by mouth 2 (two) times daily with a meal.   Misc. Devices (ROLLATOR ULTRA-LIGHT) MISC 1 each by Does not apply route daily.   Multiple Vitamins-Minerals (CENTRUM ADULTS) TABS Take 1 tablet  by mouth daily.   nitroGLYCERIN  (NITROSTAT ) 0.4 MG SL tablet Place 1 tablet (0.4 mg total) under the tongue every 5 (five) minutes x 3 doses as needed for chest pain.   rosuvastatin  (CRESTOR ) 40 MG tablet TAKE 1 TABLET BY MOUTH EVERY DAY   tiZANidine  (ZANAFLEX ) 4 MG tablet Take 1 tablet (4 mg total) by mouth every 6 (six) hours as needed for muscle spasms.   traZODone  (DESYREL ) 50  MG tablet TAKE 0.5-1 TABLETS BY MOUTH AT BEDTIME AS NEEDED FOR SLEEP.   Vitamin D , Ergocalciferol , (DRISDOL ) 1.25 MG (50000 UNIT) CAPS capsule TAKE 1 CAPSULE (50,000 UNITS TOTAL) BY MOUTH EVERY 7 (SEVEN) DAYS   [DISCONTINUED] carvedilol  (COREG ) 12.5 MG tablet TAKE 1 TABLET (12.5MG  TOTAL) BY MOUTH TWICE A DAY WITH MEALS     Review of Systems      All other systems reviewed and are otherwise negative except as noted above.  Physical Exam    VS:  BP (!) 138/58   Pulse 81   Ht 6' 3 (1.905 m)   Wt 202 lb (91.6 kg)   SpO2 97%   BMI 25.25 kg/m  , BMI Body mass index is 25.25 kg/m.  Wt Readings from Last 3 Encounters:  02/27/24 202 lb (91.6 kg)  01/15/24 219 lb (99.3 kg)  01/14/24 217 lb (98.4 kg)     GEN: Well nourished, well developed, in no acute distress. HEENT: normal. Neck: Supple, no JVD, carotid bruits, or masses. Cardiac: RRR, no murmurs, rubs, or gallops. No clubbing, cyanosis, edema.  Radials/PT 2+ and equal bilaterally.  Respiratory:  Respirations regular and unlabored, clear to auscultation bilaterally. GI: Soft, nontender, nondistended. MS: No deformity or atrophy. Skin: Warm and dry, no rash. Neuro:  Strength and sensation are intact. Psych: Normal affect.  Assessment & Plan    Frequent premature ventricular contractions (PVCs) Frequent PVCs on EKG, occurring every other beat, with potential impact on cardiac function. Possible contributing factors include suboptimal medication adherence and electrolyte imbalances. - Increase carvedilol  to 12.5 mg twice daily. - Order Zio monitor for 14  days to assess PVC load. - Perform lab work to check electrolytes, including potassium and magnesium. - Order echocardiogram to evaluate cardiac function, acknowledging potential delay in scheduling.  Atrial flutter Atrial flutter. No recent episodes reported. - Continue carvedilol  12.5 mg twice daily  Atherosclerotic heart disease of native coronary artery, status post stent History of myocardial infarction with stent placement in the LAD due to 90-95% blockage. No recent symptoms suggestive of angina or myocardial infarction. - Discussed the importance of recognizing atypical symptoms of myocardial infarction.  Type 2 diabetes mellitus Type 2 diabetes with recent HbA1c of 7.3, showing improvement from previous levels. - Acknowledged occasional challenges in management.  Peripheral neuropathy Peripheral neuropathy with significant nocturnal symptoms, managed with medication providing relief.  Hyperlipidemia -LDL 59 -Due for repeat lipid panel through PCP -Continue Crestor  40 mg daily       Disposition: Follow up 2 months with Lonni Cash, MD or APP.  Signed, Orren LOISE Fabry, PA-C 02/27/2024, 10:13 AM [facility]

## 2024-02-27 ENCOUNTER — Ambulatory Visit: Attending: Physician Assistant | Admitting: Physician Assistant

## 2024-02-27 ENCOUNTER — Ambulatory Visit: Attending: Physician Assistant

## 2024-02-27 ENCOUNTER — Other Ambulatory Visit: Payer: Self-pay | Admitting: Physician Assistant

## 2024-02-27 VITALS — BP 138/58 | HR 81 | Ht 75.0 in | Wt 202.0 lb

## 2024-02-27 DIAGNOSIS — R0609 Other forms of dyspnea: Secondary | ICD-10-CM | POA: Insufficient documentation

## 2024-02-27 DIAGNOSIS — I739 Peripheral vascular disease, unspecified: Secondary | ICD-10-CM | POA: Insufficient documentation

## 2024-02-27 DIAGNOSIS — I2510 Atherosclerotic heart disease of native coronary artery without angina pectoris: Secondary | ICD-10-CM

## 2024-02-27 DIAGNOSIS — I4892 Unspecified atrial flutter: Secondary | ICD-10-CM

## 2024-02-27 DIAGNOSIS — I10 Essential (primary) hypertension: Secondary | ICD-10-CM | POA: Diagnosis present

## 2024-02-27 DIAGNOSIS — I517 Cardiomegaly: Secondary | ICD-10-CM | POA: Insufficient documentation

## 2024-02-27 DIAGNOSIS — E785 Hyperlipidemia, unspecified: Secondary | ICD-10-CM | POA: Insufficient documentation

## 2024-02-27 MED ORDER — CARVEDILOL 12.5 MG PO TABS: 12.5000 mg | ORAL_TABLET | Freq: Two times a day (BID) | ORAL | 1 refills | Status: AC

## 2024-02-27 NOTE — Progress Notes (Unsigned)
Enrolled for [employer] to mail a ZIO XT long term holter monitor to the patients address on file.   Dr. McAlhany to read. 

## 2024-02-27 NOTE — Patient Instructions (Addendum)
 Medication Instructions:   START TAKING :COREG   12.5 MG TWICE A DAY    *If you need a refill on your cardiac medications before your next appointment, please call your pharmacy*   Lab Work: PLEASE GO DOWN STAIRS  [facility]  FIRST FLOOR   ( GET OFF ELEVATORS WALK TOWARDS WAITING AREA LAB LOCATED BY PHARMACY):  BMET AND MAG TODAY        If you have labs (blood work) drawn today and your tests are completely normal, you will receive your results only by: MyChart Message (if you have MyChart) OR A paper copy in the mail If you have any lab test that is abnormal or we need to change your treatment, we will call you to review the results.    Testing/Procedures: Your physician has requested that you have an echocardiogram. Echocardiography is a painless test that uses sound waves to create images of your heart. It provides your doctor with information about the size and shape of your heart and how well your heart's chambers and valves are working. This procedure takes approximately one hour. There are no restrictions for this procedure. Please do NOT wear cologne, perfume, aftershave, or lotions (deodorant is allowed). Please arrive 15 minutes prior to your appointment time.  Please note: We ask at that you not bring children with you during ultrasound (echo/ vascular) testing. Due to room size and safety concerns, children are not allowed in the ultrasound rooms during exams. Our front office staff cannot provide observation of children in our lobby area while testing is being conducted. An adult accompanying a patient to their appointment will only be allowed in the ultrasound room at the discretion of the ultrasound technician under special circumstances. We apologize for any inconvenience.    Your physician has recommended that you wear an event monitor. Event monitors are medical devices that record the heart's electrical activity. Doctors most often us  these monitors to diagnose  arrhythmias. Arrhythmias are problems with the speed or rhythm of the heartbeat. The monitor is a small, portable device. You can wear one while you do your normal daily activities. This is usually used to diagnose what is causing palpitations/syncope (passing out).    Follow-Up: At [facility], you and your health needs are our priority.  As part of our continuing mission to provide you with exceptional heart care, our providers are all part of one team.  This team includes your primary Cardiologist (physician) and Advanced Practice Providers or APPs (Physician Assistants and Nurse Practitioners) who all work together to provide you with the care you need, when you need it.    Your next appointment:   2 month(s)   Provider:    Lonni Cash, MD or Orren Fabry, PA-C         We recommend signing up for the patient portal called MyChart.  Sign up information is provided on this After Visit Summary.  MyChart is used to connect with patients for Virtual Visits (Telemedicine).  Patients are able to view lab/test results, encounter notes, upcoming appointments, etc.  Non-urgent messages can be sent to your provider as well.   To learn more about what you can do with MyChart, go to [email or web address].   Other Instructions  ZIO XT- Long Term Monitor Instructions  Your physician has requested you wear a ZIO patch monitor for 14 days.  This is a single patch monitor. [employer] supplies one patch monitor per enrollment. Additional stickers are not available. Please do not apply  patch if you will be having a Nuclear Stress Test,  Echocardiogram, Cardiac CT, MRI, or Chest Xray during the period you would be wearing the  monitor. The patch cannot be worn during these tests. You cannot remove and re-apply the  ZIO XT patch monitor.  Your ZIO patch monitor will be mailed 3 day USPS to your address on file. It may take 3-5 days  to receive your monitor after you have been enrolled.   Once you have received your monitor, please review the enclosed instructions. Your monitor  has already been registered assigning a specific monitor serial # to you.  Billing and Patient Assistance Program Information  We have supplied [employer] with any of your insurance information on file for billing purposes. [employer] offers a sliding scale Patient Assistance Program for patients that do not have  insurance, or whose insurance does not completely cover the cost of the ZIO monitor.  You must apply for the Patient Assistance Program to qualify for this discounted rate.  To apply, please call [employer] at [phone], select option 4, select option 2, ask to apply for  Patient Assistance Program. Meredeth will ask your household income, and how many people  are in your household. They will quote your out-of-pocket cost based on that information.  [employer] will also be able to set up a 71-month, interest-free payment plan if needed.  Applying the monitor   Shave hair from upper left chest.  Hold abrader disc by orange tab. Rub abrader in 40 strokes over the upper left chest as  indicated in your monitor instructions.  Clean area with 4 enclosed alcohol pads. Let dry.  Apply patch as indicated in monitor instructions. Patch will be placed under collarbone on left  side of chest with arrow pointing upward.  Rub patch adhesive wings for 2 minutes. Remove white label marked 1. Remove the white  label marked 2. Rub patch adhesive wings for 2 additional minutes.  While looking in a mirror, press and release button in center of patch. A small green light will  flash 3-4 times. This will be your only indicator that the monitor has been turned on.  Do not shower for the first 24 hours. You may shower after the first 24 hours.  Press the button if you feel a symptom. You will hear a small click. Record Date, Time and  Symptom in the Patient Logbook.  When you are ready to remove the patch, follow  instructions on the last 2 pages of Patient  Logbook. Stick patch monitor onto the last page of Patient Logbook.  Place Patient Logbook in the blue and white box. Use locking tab on box and tape box closed  securely. The blue and white box has prepaid postage on it. Please place it in the mailbox as  soon as possible. Your physician should have your test results approximately 7 days after the  monitor has been mailed back to [facility].  Call [facility] Customer Care at [phone] if you have questions regarding  your ZIO XT patch monitor. Call them immediately if you see an orange light blinking on your  monitor.  If your monitor falls off in less than 4 days, contact our Monitor department at [phone].  If your monitor becomes loose or falls off after 4 days call [employer] at [phone] for  suggestions on securing your monitor   Low-Sodium Eating Plan Salt (sodium) helps you keep a healthy balance of fluids in your body. Too much sodium can  raise your blood pressure. It can also cause fluid and waste to be held in your body. Your health care provider or dietitian may recommend a low-sodium eating plan if you have high blood pressure (hypertension), kidney disease, liver disease, or heart failure. Eating less sodium can help lower your blood pressure and reduce swelling. It can also protect your heart, liver, and kidneys. What are tips for following this plan? Reading food labels  Check food labels for the amount of sodium per serving. If you eat more than one serving, you must multiply the listed amount by the number of servings. Choose foods with less than 140 milligrams (mg) of sodium per serving. Avoid foods with 300 mg of sodium or more per serving. Always check how much sodium is in a product, even if the label says unsalted or no salt added. Shopping  Buy products labeled as low-sodium or no salt added. Buy fresh foods. Avoid canned foods and pre-made or  frozen meals. Avoid canned, cured, or processed meats. Buy breads that have less than 80 mg of sodium per slice. Cooking  Eat more home-cooked food. Try to eat less restaurant, buffet, and fast food. Try not to add salt when you cook. Use salt-free seasonings or herbs instead of table salt or sea salt. Check with your provider or pharmacist before using salt substitutes. Cook with plant-based oils, such as canola, sunflower, or olive oil. Meal planning When eating at a restaurant, ask if your food can be made with less salt or no salt. Avoid dishes labeled as brined, pickled, cured, or smoked. Avoid dishes made with soy sauce, miso, or teriyaki sauce. Avoid foods that have monosodium glutamate (MSG) in them. MSG may be added to some restaurant food, sauces, soups, bouillon, and canned foods. Make meals that can be grilled, baked, poached, roasted, or steamed. These are often made with less sodium. General information Try to limit your sodium intake to 1,500-2,300 mg each day, or the amount told by your provider. What foods should I eat? Fruits Fresh, frozen, or canned fruit. Fruit juice. Vegetables Fresh or frozen vegetables. No salt added canned vegetables. No salt added tomato sauce and paste. Low-sodium or reduced-sodium tomato and vegetable juice. Grains Low-sodium cereals, such as oats, puffed wheat and rice, and shredded wheat. Low-sodium crackers. Unsalted rice. Unsalted pasta. Low-sodium bread. Whole grain breads and whole grain pasta. Meats and other proteins Fresh or frozen meat, poultry, seafood, and fish. These should have no added salt. Low-sodium canned tuna and salmon. Unsalted nuts. Dried peas, beans, and lentils without added salt. Unsalted canned beans. Eggs. Unsalted nut butters. Dairy Milk. Soy milk. Cheese that is naturally low in sodium, such as ricotta cheese, fresh mozzarella, or Swiss cheese. Low-sodium or reduced-sodium cheese. Cream cheese. Yogurt. Seasonings  and condiments Fresh and dried herbs and spices. Salt-free seasonings. Low-sodium mustard and ketchup. Sodium-free salad dressing. Sodium-free light mayonnaise. Fresh or refrigerated horseradish. Lemon juice. Vinegar. Other foods Homemade, reduced-sodium, or low-sodium soups. Unsalted popcorn and pretzels. Low-salt or salt-free chips. The items listed above may not be all the foods and drinks you can have. Talk to a dietitian to learn more. What foods should I avoid? Vegetables Sauerkraut, pickled vegetables, and relishes. Olives. [place] fries. Onion rings. Regular canned vegetables, except low-sodium or reduced-sodium items. Regular canned tomato sauce and paste. Regular tomato and vegetable juice. Frozen vegetables in sauces. Grains Instant hot cereals. Bread stuffing, pancake, and biscuit mixes. Croutons. Seasoned rice or pasta mixes. Noodle soup cups. Boxed or  frozen macaroni and cheese. Regular salted crackers. Self-rising flour. Meats and other proteins Meat or fish that is salted, canned, smoked, spiced, or pickled. Precooked or cured meat, such as sausages or meat loaves. [place]. Ham. Pepperoni. Hot dogs. Corned beef. Chipped beef. Salt pork. Jerky. Pickled herring, anchovies, and sardines. Regular canned tuna. Salted nuts. Dairy Processed cheese and cheese spreads. Hard cheeses. Cheese curds. Blue cheese. Feta cheese. String cheese. Regular cottage cheese. Buttermilk. Canned milk. Fats and oils Salted butter. Regular margarine. Ghee. Bacon fat. Seasonings and condiments Onion salt, garlic salt, seasoned salt, table salt, and sea salt. Canned and packaged gravies. Worcestershire sauce. Tartar sauce. Barbecue sauce. Teriyaki sauce. Soy sauce, including reduced-sodium soy sauce. Steak sauce. Fish sauce. Oyster sauce. Cocktail sauce. Horseradish that you find on the shelf. Regular ketchup and mustard. Meat flavorings and tenderizers. Bouillon cubes. Hot sauce. Pre-made or packaged marinades.  Pre-made or packaged taco seasonings. Relishes. Regular salad dressings. Salsa. Other foods Salted popcorn and pretzels. Corn chips and puffs. Potato and tortilla chips. Canned or dried soups. Pizza. Frozen entrees and pot pies. The items listed above may not be all the foods and drinks you should avoid. Talk to a dietitian to learn more. This information is not intended to replace advice given to you by your health care provider. Make sure you discuss any questions you have with your health care provider. Document Revised: 05/30/2022 Document Reviewed: 05/30/2022 Elsevier Patient Education  2024 [employer]       Heart-Healthy Eating Plan Eating a healthy diet is important for the health of your heart. A heart-healthy eating plan includes: Eating less unhealthy fats. Eating more healthy fats. Eating less salt in your food. Salt is also called sodium. Making other changes in your diet. Talk with your doctor or a diet specialist (dietitian) to create an eating plan that is right for you. What is my plan? Your doctor may recommend an eating plan that includes: Total fat: ______% or less of total calories a day. Saturated fat: ______% or less of total calories a day. Cholesterol: less than _________mg a day. Sodium: less than _________mg a day. What are tips for following this plan? Cooking Avoid frying your food. Try to bake, boil, grill, or broil it instead. You can also reduce fat by: Removing the skin from poultry. Removing all visible fats from meats. Steaming vegetables in water or broth. Meal planning  At meals, divide your plate into four equal parts: Fill one-half of your plate with vegetables and green salads. Fill one-fourth of your plate with whole grains. Fill one-fourth of your plate with lean protein foods. Eat 2-4 cups of vegetables per day. One cup of vegetables is: 1 cup (91 g) broccoli or cauliflower florets. 2 medium carrots. 1 large bell pepper. 1 large  sweet potato. 1 large tomato. 1 medium white potato. 2 cups (150 g) raw leafy greens. Eat 1-2 cups of fruit per day. One cup of fruit is: 1 small apple 1 large banana 1 cup (237 g) mixed fruit, 1 large orange,  cup (82 g) dried fruit, 1 cup (240 mL) 100% fruit juice. Eat more foods that have soluble fiber. These are apples, broccoli, carrots, beans, peas, and barley. Try to get 20-30 g of fiber per day. Eat 4-5 servings of nuts, legumes, and seeds per week: 1 serving of dried beans or legumes equals  cup (90 g) cooked. 1 serving of nuts is  oz (12 almonds, 24 pistachios, or 7 walnut halves). 1 serving of  seeds equals  oz (8 g). General information Eat more home-cooked food. Eat less restaurant, buffet, and fast food. Limit or avoid alcohol. Limit foods that are high in starch and sugar. Avoid fried foods. Lose weight if you are overweight. Keep track of how much salt (sodium) you eat. This is important if you have high blood pressure. Ask your doctor to tell you more about this. Try to add vegetarian meals each week. Fats Choose healthy fats. These include olive oil and canola oil, flaxseeds, walnuts, almonds, and seeds. Eat more omega-3 fats. These include salmon, mackerel, sardines, tuna, flaxseed oil, and ground flaxseeds. Try to eat fish at least 2 times each week. Check food labels. Avoid foods with trans fats or high amounts of saturated fat. Limit saturated fats. These are often found in animal products, such as meats, butter, and cream. These are also found in plant foods, such as palm oil, palm kernel oil, and coconut oil. Avoid foods with partially hydrogenated oils in them. These have trans fats. Examples are stick margarine, some tub margarines, cookies, crackers, and other baked goods. What foods should I eat? Fruits All fresh, canned (in natural juice), or frozen fruits. Vegetables Fresh or frozen vegetables (raw, steamed, roasted, or grilled). Green  salads. Grains Most grains. Choose whole wheat and whole grains most of the time. Rice and pasta, including brown rice and pastas made with whole wheat. Meats and other proteins Lean, well-trimmed beef, veal, pork, and lamb. Chicken and [place] without skin. All fish and shellfish. Wild duck, rabbit, pheasant, and venison. Egg whites or low-cholesterol egg substitutes. Dried beans, peas, lentils, and tofu. Seeds and most nuts. Dairy Low-fat or nonfat cheeses, including ricotta and mozzarella. Skim or 1% milk that is liquid, powdered, or evaporated. Buttermilk that is made with low-fat milk. Nonfat or low-fat yogurt. Fats and oils Non-hydrogenated (trans-free) margarines. Vegetable oils, including soybean, sesame, sunflower, olive, peanut, safflower, corn, canola, and cottonseed. Salad dressings or mayonnaise made with a vegetable oil. Beverages Mineral water. Coffee and tea. Diet carbonated beverages. Sweets and desserts Sherbet, gelatin, and fruit ice. Small amounts of dark chocolate. Limit all sweets and desserts. Seasonings and condiments All seasonings and condiments. The items listed above may not be a complete list of foods and drinks you can eat. Contact a dietitian for more options. What foods should I avoid? Fruits Canned fruit in heavy syrup. Fruit in cream or butter sauce. Fried fruit. Limit coconut. Vegetables Vegetables cooked in cheese, cream, or butter sauce. Fried vegetables. Grains Breads that are made with saturated or trans fats, oils, or whole milk. Croissants. Sweet rolls. Donuts. High-fat crackers, such as cheese crackers. Meats and other proteins Fatty meats, such as hot dogs, ribs, sausage, bacon, rib-eye roast or steak. High-fat deli meats, such as salami and bologna. Caviar. Domestic duck and goose. Organ meats, such as liver. Dairy Cream, sour cream, cream cheese, and creamed cottage cheese. Whole-milk cheeses. Whole or 2% milk that is liquid, evaporated, or  condensed. Whole buttermilk. Cream sauce or high-fat cheese sauce. Yogurt that is made from whole milk. Fats and oils Meat fat, or shortening. Cocoa butter, hydrogenated oils, palm oil, coconut oil, palm kernel oil. Solid fats and shortenings, including bacon fat, salt pork, lard, and butter. Nondairy cream substitutes. Salad dressings with cheese or sour cream. Beverages Regular sodas and juice drinks with added sugar. Sweets and desserts Frosting. Pudding. Cookies. Cakes. Pies. Milk chocolate or white chocolate. Buttered syrups. Full-fat ice cream or ice cream drinks. The items  listed above may not be a complete list of foods and drinks to avoid. Contact a dietitian for more information. Summary Heart-healthy meal planning includes eating less unhealthy fats, eating more healthy fats, and making other changes in your diet. Eat a balanced diet. This includes fruits and vegetables, low-fat or nonfat dairy, lean protein, nuts and legumes, whole grains, and heart-healthy oils and fats. This information is not intended to replace advice given to you by your health care provider. Make sure you discuss any questions you have with your health care provider. Document Revised: 06/18/2021 Document Reviewed: 06/18/2021 Elsevier Patient Education  2024 [employer].

## 2024-02-28 LAB — BASIC METABOLIC PANEL WITH GFR
BUN/Creatinine Ratio: 12 (ref 10–24)
BUN: 25 mg/dL (ref 8–27)
CO2: 22 mmol/L (ref 20–29)
Calcium: 9.7 mg/dL (ref 8.6–10.2)
Chloride: 104 mmol/L (ref 96–106)
Creatinine, Ser: 2.09 mg/dL — ABNORMAL HIGH (ref 0.76–1.27)
Glucose: 287 mg/dL — ABNORMAL HIGH (ref 70–99)
Potassium: 4.2 mmol/L (ref 3.5–5.2)
Sodium: 141 mmol/L (ref 134–144)
eGFR: 33 mL/min/1.73 — ABNORMAL LOW (ref >59)

## 2024-02-28 LAB — MAGNESIUM: Magnesium: 2.3 mg/dL (ref 1.6–2.3)

## 2024-03-02 ENCOUNTER — Emergency Department (HOSPITAL_COMMUNITY)
Admission: EM | Admit: 2024-03-02 | Discharge: 2024-03-03 | Disposition: A | Attending: Emergency Medicine | Admitting: Emergency Medicine

## 2024-03-02 ENCOUNTER — Other Ambulatory Visit: Payer: Self-pay

## 2024-03-02 ENCOUNTER — Encounter (HOSPITAL_COMMUNITY): Payer: Self-pay

## 2024-03-02 ENCOUNTER — Emergency Department (HOSPITAL_COMMUNITY)

## 2024-03-02 DIAGNOSIS — E1122 Type 2 diabetes mellitus with diabetic chronic kidney disease: Secondary | ICD-10-CM | POA: Insufficient documentation

## 2024-03-02 DIAGNOSIS — N1832 Chronic kidney disease, stage 3b: Secondary | ICD-10-CM | POA: Insufficient documentation

## 2024-03-02 DIAGNOSIS — I2510 Atherosclerotic heart disease of native coronary artery without angina pectoris: Secondary | ICD-10-CM | POA: Insufficient documentation

## 2024-03-02 DIAGNOSIS — F17210 Nicotine dependence, cigarettes, uncomplicated: Secondary | ICD-10-CM | POA: Diagnosis not present

## 2024-03-02 DIAGNOSIS — Z85118 Personal history of other malignant neoplasm of bronchus and lung: Secondary | ICD-10-CM | POA: Insufficient documentation

## 2024-03-02 DIAGNOSIS — R079 Chest pain, unspecified: Secondary | ICD-10-CM | POA: Diagnosis present

## 2024-03-02 DIAGNOSIS — I129 Hypertensive chronic kidney disease with stage 1 through stage 4 chronic kidney disease, or unspecified chronic kidney disease: Secondary | ICD-10-CM | POA: Diagnosis not present

## 2024-03-02 LAB — TROPONIN I (HIGH SENSITIVITY)
Troponin I (High Sensitivity): 8 ng/L (ref <18)
Troponin I (High Sensitivity): 7 ng/L (ref <18)

## 2024-03-02 LAB — BASIC METABOLIC PANEL WITH GFR
Anion gap: 11 (ref 5–15)
BUN: 21 mg/dL (ref 8–23)
CO2: 23 mmol/L (ref 22–32)
Calcium: 9 mg/dL (ref 8.9–10.3)
Chloride: 104 mmol/L (ref 98–111)
Creatinine, Ser: 2.05 mg/dL — ABNORMAL HIGH (ref 0.61–1.24)
GFR, Estimated: 34 mL/min — ABNORMAL LOW (ref >60)
Glucose, Bld: 290 mg/dL — ABNORMAL HIGH (ref 70–99)
Potassium: 4 mmol/L (ref 3.5–5.1)
Sodium: 138 mmol/L (ref 135–145)

## 2024-03-02 LAB — CBC
HCT: 35.6 % — ABNORMAL LOW (ref 39.0–52.0)
Hemoglobin: 11.5 g/dL — ABNORMAL LOW (ref 13.0–17.0)
MCH: 29.9 pg (ref 26.0–34.0)
MCHC: 32.3 g/dL (ref 30.0–36.0)
MCV: 92.7 fL (ref 80.0–100.0)
Platelets: 225 K/uL (ref 150–400)
RBC: 3.84 MIL/uL — ABNORMAL LOW (ref 4.22–5.81)
RDW: 13.2 % (ref 11.5–15.5)
WBC: 5.4 K/uL (ref 4.0–10.5)
nRBC: 0 % (ref 0.0–0.2)

## 2024-03-02 NOTE — ED Provider Notes (Signed)
 [facility] Emergency Department Provider Note MRN:  [id number]  Arrival date & time: 03/02/24     Chief Complaint   Chest Pain and Shortness of Breath   History of Present Illness   Kirk [name] is a 68 y.o. year-old male with a history of CAD with stent presenting to the ED with chief complaint of chest pain and shortness of breath.  Intermittent chest pain and shortness of breath over the past week.  Seems to happen randomly, not necessarily with exertion.  Had some lightheadedness with the pain this evening, here for evaluation.  Had a cardiology appointment last week and told the cardiologist about it, was told it was likely not the heart.  Review of Systems  A thorough review of systems was obtained and all systems are negative except as noted in the HPI and PMH.   Patient's Health History    Past Medical History:  Diagnosis Date   Arthritis    right knee (09/04/2015)   Atrial flutter with rapid ventricular response (HCC) 04/19/2019   Dizziness 10/2019   Dyspnea    increased exertion   Elevated serum creatinine 07/2020   HTN (hypertension)    Hyperlipidemia    Kidney disease    stage 3B   Lung cancer (HCC) 05/2019   Microalbuminuria 07/2020   NSTEMI (non-ST elevated myocardial infarction) (HCC) 09/04/2015   a. cath 09/05/2015: 95% stenosis mid-LAD, 55% mid-RCA, and 40% prox-RCA. PCI performed w/ Synergy DES to LAD   PAD (peripheral artery disease)    Stenting of bilateral iliacs in 2003.   Refusal of blood transfusions as patient is Jehovah's Witness    Type II diabetes mellitus (HCC)    Vitamin D  deficiency 07/2020    Past Surgical History:  Procedure Laterality Date   CARDIAC CATHETERIZATION N/A 09/05/2015   Procedure: Left Heart Cath and Coronary Angiography;  Surgeon: Debby DELENA Sor, MD;  Location: [facility];  Service: Cardiovascular;  Laterality: N/A;   CARDIAC CATHETERIZATION N/A 09/05/2015   Procedure: Coronary Stent  Intervention;  Surgeon: Debby DELENA Sor, MD;  Location: [facility];  Service: Cardiovascular;  Laterality: N/A;   IR IMAGING GUIDED PORT INSERTION  07/15/2019   ORIF CONGENITAL HIP DISLOCATION Left ~ 1965   put pins in it   PERIPHERAL VASCULAR CATHETERIZATION Bilateral 2003   stenting   TONSILLECTOMY  ~ 1963    Family History  Problem Relation Age of Onset   Lymphoma Mother    Prostate cancer Father    CAD Neg Hx    Colon cancer Neg Hx    Rectal cancer Neg Hx    Stomach cancer Neg Hx    Esophageal cancer Neg Hx     Social History   Socioeconomic History   Marital status: Married    Spouse name: Not on file   Number of children: 2   Years of education: Not on file   Highest education level: Bachelor's degree (e.g., BA, AB, BS)  Occupational History   Occupation: Furniture salesman-retired  Tobacco Use   Smoking status: Former    Current packs/day: 0.00    Average packs/day: 2.0 packs/day for 35.0 years (70.0 ttl pk-yrs)    Types: Cigarettes    Start date: 02/19/1968    Quit date: 02/19/2003    Years since quitting: 21.0   Smokeless tobacco: Never  Vaping Use   Vaping status: Never Used  Substance and Sexual Activity   Alcohol use: Not Currently  Alcohol/week: 0.0 standard drinks of alcohol   Drug use: Not Currently    Types: Marijuana   Sexual activity: Not Currently  Other Topics Concern   Not on file  Social History Narrative   Not on file   Social Drivers of Health   Financial Resource Strain: Low Risk  (07/31/2023)   Overall Financial Resource Strain (CARDIA)    Difficulty of Paying Living Expenses: Not hard at all  Food Insecurity: No Food Insecurity (07/31/2023)   Hunger Vital Sign    Worried About Running Out of Food in the Last Year: Never true    Ran Out of Food in the Last Year: Never true  Transportation Needs: No Transportation Needs (07/31/2023)   PRAPARE - Administrator, Civil Service (Medical): No    Lack of Transportation  (Non-Medical): No  Physical Activity: Patient Declined (07/31/2023)   Exercise Vital Sign    Days of Exercise per Week: Patient declined    Minutes of Exercise per Session: Patient declined  Stress: No Stress Concern Present (07/31/2023)   [employer] of Occupational Health - Occupational Stress Questionnaire    Feeling of Stress : Not at all  Recent Concern: Stress - Stress Concern Present (07/15/2023)   [employer] of Occupational Health - Occupational Stress Questionnaire    Feeling of Stress : To some extent  Social Connections: Socially Integrated (07/31/2023)   Social Connection and Isolation Panel    Frequency of Communication with Friends and Family: More than three times a week    Frequency of Social Gatherings with Friends and Family: More than three times a week    Attends Religious Services: More than 4 times per year    Active Member of [employer] or Organizations: Yes    Attends Engineer, structural: More than 4 times per year    Marital Status: Married  Catering manager Violence: Not At Risk (07/31/2023)   Humiliation, Afraid, Rape, and Kick questionnaire    Fear of Current or Ex-Partner: No    Emotionally Abused: No    Physically Abused: No    Sexually Abused: No     Physical Exam   Vitals:   03/02/24 1636 03/02/24 2030  BP: 115/81 121/60  Pulse: 78 66  Resp: 14 18  Temp: 97.6 F (36.4 C) 98.3 F (36.8 C)  SpO2: 96% 98%    CONSTITUTIONAL: Well-appearing, NAD NEURO/PSYCH:  Alert and oriented x 3, no focal deficits EYES:  eyes equal and reactive ENT/NECK:  no LAD, no JVD CARDIO: Regular rate, well-perfused, normal S1 and S2 PULM:  CTAB no wheezing or rhonchi GI/GU:  non-distended, non-tender MSK/SPINE:  No gross deformities, no edema SKIN:  no rash, atraumatic   *Additional and/or pertinent findings included in MDM below  Diagnostic and Interventional Summary    EKG Interpretation Date/Time:  Tuesday March 02 2024 16:29:40 EDT Ventricular  Rate:  79 PR Interval:  234 QRS Duration:  70 QT Interval:  368 QTC Calculation: 421 R Axis:   78  Text Interpretation: Sinus rhythm with 1st degree A-V block Otherwise normal ECG When compared with ECG of 27-Feb-2024 08:09, PREVIOUS ECG IS PRESENT Confirmed by Theadore Sharper [phone]) on 03/02/2024 11:23:08 PM       Labs Reviewed  BASIC METABOLIC PANEL WITH GFR - Abnormal; Notable for the following components:      Result Value   Glucose, Bld 290 (*)    Creatinine, Ser 2.05 (*)    GFR, Estimated 34 (*)  All other components within normal limits  CBC - Abnormal; Notable for the following components:   RBC 3.84 (*)    Hemoglobin 11.5 (*)    HCT 35.6 (*)    All other components within normal limits  TROPONIN I (HIGH SENSITIVITY)  TROPONIN I (HIGH SENSITIVITY)    DG Chest 2 View  Final Result      Medications - No data to display   Procedures  /  Critical Care Procedures  ED Course and Medical Decision Making  Initial Impression and Ddx Patient well-appearing in no acute distress, history of CAD with stent.  Currently without pain, normal vitals.  Patient could be experiencing stable angina versus noncardiac cause of pain such as MSK, GI etiology.  Doubt PE.  Past medical/surgical history that increases complexity of ED encounter: CAD  Interpretation of Diagnostics I personally reviewed the EKG and my interpretation is as follows: Sinus rhythm without concerning ischemic findings  No significant blood count or electrolyte disturbance.  Troponin negative x 2  Patient Reassessment and Ultimate Disposition/Management     Reassuring workup, pain-free, sounds like cardiology is already aware of this recent pain though it was not mentioned in their recent documentation.  Patient is comfortable following up in the office again, strict return precautions discussed.  Patient management required discussion with the following services or consulting groups:  None  Complexity of  Problems Addressed Acute illness or injury that poses threat of life of bodily function  Additional Data Reviewed and Analyzed Further history obtained from: Prior labs/imaging results  Additional Factors Impacting ED Encounter Risk Consideration of hospitalization  Ozell HERO. Theadore, MD [facility] Emergency Medicine [facility] [email or web address]  Final Clinical Impressions(s) / ED Diagnoses     ICD-10-CM   1. Chest pain, unspecified type  R07.9       ED Discharge Orders     None        Discharge Instructions Discussed with and Provided to Patient:    Discharge Instructions      You were evaluated in the Emergency Department and after careful evaluation, we did not find any emergent condition requiring admission or further testing in the hospital.  Your exam/testing today is overall reassuring.  Recommend continuing your home medications and following up closely with your cardiologist.  Please return to the Emergency Department if you experience any worsening of your condition.   Thank you for allowing us  to be a part of your care.      Theadore Ozell HERO, MD 03/02/24 2352

## 2024-03-02 NOTE — Discharge Instructions (Addendum)
 You were evaluated in the Emergency Department and after careful evaluation, we did not find any emergent condition requiring admission or further testing in the hospital.  Your exam/testing today is overall reassuring.  Recommend continuing your home medications and following up closely with your cardiologist.  Please return to the Emergency Department if you experience any worsening of your condition.   Thank you for allowing us  to be a part of your care.

## 2024-03-02 NOTE — ED Triage Notes (Signed)
 Pt c.o chest tightness and sob x 1 week

## 2024-03-04 ENCOUNTER — Inpatient Hospital Stay: Payer: Medicare Other | Attending: Internal Medicine

## 2024-03-04 DIAGNOSIS — Z85118 Personal history of other malignant neoplasm of bronchus and lung: Secondary | ICD-10-CM | POA: Diagnosis present

## 2024-03-05 ENCOUNTER — Ambulatory Visit: Payer: Self-pay | Admitting: Physician Assistant

## 2024-03-10 ENCOUNTER — Telehealth: Payer: Self-pay

## 2024-03-10 DIAGNOSIS — E1151 Type 2 diabetes mellitus with diabetic peripheral angiopathy without gangrene: Secondary | ICD-10-CM

## 2024-03-10 MED ORDER — METFORMIN HCL ER 500 MG PO TB24: 500.0000 mg | ORAL_TABLET | Freq: Two times a day (BID) | ORAL | 3 refills | Status: AC

## 2024-03-10 NOTE — Telephone Encounter (Signed)
 Patient was last engaged by pharmacy on 11/10/23. PMH includes HTN, PAD (bilateral ilac stents), T2DM, CAD s/p NSTEMI (2017 - multivessel CAD with 95% stenosis in the mid-LAD, 55% mid-RCA, and 40% prox-RCA. Successful PCI was performed with a Synergy DES to the LAD ), A flutter with RVR (stopped Eliquis  2/2 cost and does not wish to restart in the setting of metastatic cancer), adenocarcinoma of R lung (metastatic, stage 4, diagnosed Jan 2021) s/p chemo - currently stable, HLD.   Attempted to reach him on 02/09/24, but appt was rescheduled to Jan 2026 when we could revisit coverage of SGLT2i Jardiance  for kidney protection. Unfortunately, patient visited the ED for CP on 03/02/24. He had been seen by cardiology on 02/27/24, and BP was 138/58 mmHg, HR 81. He had frequency PVCs and reported some occasional fatigue and dizziness. Carvedilol  was increased to 12.5 mg BID.  Echo was ordered (scheduled for Nov). He did not describe angina at that appointment. At ED visit, EKG and troponins were negative for ACS event.   Today, patient reports chest pain is still coming/going over the past week. It lasts a couple minutes then goes away. With pain he will feel lightheaded. He is not taking NTG but says he has some at home. He will check to make sure it is not expired. He is concerned that he has another blockage. Encouraged him to reach back out to cardiology office for follow-up as he may benefit from CCB or nitrate to help with possible angina symptoms. Reinforced ED return precautions today. Provided with phone number for [facility].     Latest Ref Rng & Units 03/02/2024    4:38 PM 02/27/2024    9:01 AM 01/08/2024    3:32 PM  BMP  Glucose 70 - 99 mg/dL 709  712  798   BUN 8 - 23 mg/dL 21  25  23    Creatinine 0.61 - 1.24 mg/dL 7.94  7.90  8.03   BUN/Creat Ratio 10 - 24  12    Sodium 135 - 145 mmol/L 138  141  138   Potassium 3.5 - 5.1 mmol/L 4.0  4.2  3.7   Chloride 98 - 111 mmol/L 104  104  106    CO2 22 - 32 mmol/L 23  22  24    Calcium  8.9 - 10.3 mg/dL 9.0  9.7  9.0     Primarily wanted to reach out to patient to discuss recent decline in kidney function and need for metformin  dose adjustment. eGFR hovering just above 30 mL/min. Recommended reducing metformin  XR to 500 mg BID (TDD of 1000 mg daily). Should discontinue if eGFR drops < 30 mL/min. Patient expresses understanding. Reviewed med list for other dose-adjustments needed. Ok to continue rosuvastatin  40 mg daily for now, but should likely switch to atorvastatin  40-80 mg daily if eGFR drops < 30 mL/min.   Lorain Baseman, PharmD [facility] [phone]

## 2024-03-12 MED ORDER — NITROGLYCERIN 0.4 MG SL SUBL: 0.4000 mg | SUBLINGUAL_TABLET | SUBLINGUAL | 2 refills | Status: DC | PRN

## 2024-03-12 NOTE — Addendum Note (Signed)
 Addended by: LUCIEN ORREN SAILOR on: 03/12/2024 12:21 PM   Modules accepted: Orders

## 2024-03-12 NOTE — Telephone Encounter (Signed)
 Left a message per [facility] request for the patient to contact the office and let us  know if he would like to come in for another evaluation.

## 2024-03-13 ENCOUNTER — Telehealth: Admitting: Nurse Practitioner

## 2024-03-13 DIAGNOSIS — J208 Acute bronchitis due to other specified organisms: Secondary | ICD-10-CM | POA: Diagnosis not present

## 2024-03-13 MED ORDER — BENZONATATE 200 MG PO CAPS: 200.0000 mg | ORAL_CAPSULE | Freq: Two times a day (BID) | ORAL | 0 refills | Status: AC | PRN

## 2024-03-13 MED ORDER — PSEUDOEPH-BROMPHEN-DM 30-2-10 MG/5ML PO SYRP: 5.0000 mL | ORAL_SOLUTION | Freq: Four times a day (QID) | ORAL | 0 refills | Status: AC | PRN

## 2024-03-13 NOTE — Progress Notes (Signed)
 Virtual Visit Consent   GRISELDA TOSH, you are scheduled for a virtual visit with a [facility] provider today. Just as with appointments in the office, your consent must be obtained to participate. Your consent will be active for this visit and any virtual visit you may have with one of our providers in the next 365 days. If you have a MyChart account, a copy of this consent can be sent to you electronically.  As this is a virtual visit, video technology does not allow for your provider to perform a traditional examination. This may limit your provider's ability to fully assess your condition. If your provider identifies any concerns that need to be evaluated in person or the need to arrange testing (such as labs, EKG, etc.), we will make arrangements to do so. Although advances in technology are sophisticated, we cannot ensure that it will always work on either your end or our end. If the connection with a video visit is poor, the visit may have to be switched to a telephone visit. With either a video or telephone visit, we are not always able to ensure that we have a secure connection.  By engaging in this virtual visit, you consent to the provision of healthcare and authorize for your insurance to be billed (if applicable) for the services provided during this visit. Depending on your insurance coverage, you may receive a charge related to this service.  I need to obtain your verbal consent now. Are you willing to proceed with your visit today? BALTAZAR PEKALA has provided verbal consent on 03/13/2024 for a virtual visit (video or telephone). Haze LELON Servant, NP  Date: 03/13/2024 11:01 AM   Virtual Visit via Video Note   I, Haze LELON Servant, connected with  PAUBLO WARSHAWSKY  ([id number], March 18, 1956) on 03/13/24 at 10:45 AM EDT by a video-enabled telemedicine application and verified that I am speaking with the correct person using two identifiers.  Location: Patient: Virtual Visit Location  Patient: Home Provider: Virtual Visit Location Provider: Home Office   I discussed the limitations of evaluation and management by telemedicine and the availability of in person appointments. The patient expressed understanding and agreed to proceed.    History of Present Illness: ANTOINETTE HASKETT is a 68 y.o. who identifies as a male who was assigned male at birth, and is being seen today for viral bronchitis.   4 days ago Mr. Cockerell was experiencing scratchy throat and dry cough. The cough is now productive of yellow sputum. He is taking alka selzter plus cold with no relief. No fever or chills, COVID negative x3, no sore throat or headache.  States he had a coughing episode last night that kept him up.    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   Drug-induced neutropenia 12/26/2020   Port-A-Cath in place 12/29/2019   Metastatic non-small cell lung cancer (HCC) 07/07/2019   Encounter for antineoplastic chemotherapy 07/07/2019   Encounter for antineoplastic immunotherapy 07/07/2019   Goals of care, counseling/discussion 06/28/2019   Adenocarcinoma of right lung, stage 4 (HCC) 06/28/2019   Cancer associated pain 06/28/2019   CAD (coronary artery disease) 06/08/2019   Mediastinal lymphadenopathy 06/03/2019   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) 06/03/2019   Hemoglobin A1C between 7% and 9% indicating borderline diabetic control (HCC) 05/10/2019  COVID-19 virus infection 05/10/2019   Atrial flutter with rapid ventricular response (HCC) 04/19/2019   Chest pain with moderate risk for cardiac etiology 04/19/2019   Paroxysmal atrial flutter (HCC) 04/19/2019   Chest congestion 07/10/2018   Cough 07/10/2018   NSTEMI (non-ST elevated myocardial infarction) (HCC) 09/04/2015   PVD  (peripheral vascular disease) 06/08/2015   DDD (degenerative disc disease), cervical 06/08/2015   Hyperlipidemia 06/08/2015   Diabetes mellitus with nephropathy (HCC) 06/08/2015   Diabetes mellitus with peripheral vascular disease (HCC) 06/08/2015   Annual physical exam 01/03/2015   PAD (peripheral artery disease) 02/18/2013   Diabetes (HCC) 02/18/2013   HTN (hypertension) 02/18/2013    Allergies:  Allergies  Allergen Reactions   Bee Venom Hives   Seasonal Ic [Octacosanol]    Medications:  Current Outpatient Medications:    benzonatate  (TESSALON ) 200 MG capsule, Take 1 capsule (200 mg total) by mouth 2 (two) times daily as needed for cough., Disp: 20 capsule, Rfl: 0   brompheniramine-pseudoephedrine-DM 30-2-10 MG/5ML syrup, Take 5 mLs by mouth 4 (four) times daily as needed., Disp: 480 mL, Rfl: 0   acetaminophen  (TYLENOL ) 500 MG tablet, Take 500 mg by mouth every 6 (six) hours as needed for moderate pain or fever. , Disp: , Rfl:    albuterol  (VENTOLIN  HFA) 108 (90 Base) MCG/ACT inhaler, Inhale 2 puffs into the lungs every 6 (six) hours as needed for wheezing or shortness of breath., Disp: 8 g, Rfl: 0   aspirin  EC 81 MG tablet, Take 81 mg by mouth at bedtime., Disp: , Rfl:    Blood Glucose Monitoring Suppl (ACCU-CHEK GUIDE) w/Device KIT, USE UP TO FOUR TIMES DAILY AS DIRECTED. (FOR ICD-10 E10.9, E11.9)., Disp: 1 kit, Rfl: 0   carvedilol  (COREG ) 12.5 MG tablet, Take 1 tablet (12.5 mg total) by mouth 2 (two) times daily with a meal., Disp: 180 tablet, Rfl: 1   clopidogrel  (PLAVIX ) 75 MG tablet, TAKE 1 TABLET BY MOUTH EVERY DAY, Disp: 90 tablet, Rfl: 1   diphenhydrAMINE  (BENADRYL ) 25 mg capsule, Take 50 mg by mouth every 6 (six) hours as needed., Disp: , Rfl:    docusate sodium  (COLACE) 100 MG capsule, TAKE 1 CAPSULE (100 MG TOTAL) BY MOUTH 2 (TWO) TIMES DAILY AS NEEDED FOR MILD CONSTIPATION., Disp: 180 capsule, Rfl: 0   EPINEPHrine  0.3 mg/0.3 mL IJ SOAJ injection, Inject 0.3 mg into the  muscle as needed for anaphylaxis., Disp: 1 each, Rfl: 0   gabapentin  (NEURONTIN ) 300 MG capsule, TAKE 1 CAPSULE BY MOUTH THREE TIMES A DAY, Disp: 270 capsule, Rfl: 3   glimepiride  (AMARYL ) 2 MG tablet, TAKE 1 TABLET BY MOUTH DAILY WITH BREAKFAST, Disp: 90 tablet, Rfl: 2   Glucose Blood (BLOOD GLUCOSE TEST STRIPS) STRP, 1 each by In Vitro route 3 (three) times daily with meals. May substitute to any manufacturer covered by patient's insurance., Disp: 100 strip, Rfl: 3   metFORMIN  (GLUCOPHAGE -XR) 500 MG 24 hr tablet, Take 1 tablet (500 mg total) by mouth 2 (two) times daily with a meal., Disp: 180 tablet, Rfl: 3   Misc. Devices (ROLLATOR ULTRA-LIGHT) MISC, 1 each by Does not apply route daily., Disp: 1 each, Rfl: 0   Multiple Vitamins-Minerals (CENTRUM ADULTS) TABS, Take 1 tablet by mouth daily., Disp: 90 tablet, Rfl: 1   nitroGLYCERIN  (NITROSTAT ) 0.4 MG SL tablet, Place 1 tablet (0.4 mg total) under the tongue every 5 (five) minutes x 3 doses as needed for chest pain., Disp: 25 tablet, Rfl: 2   rosuvastatin  (CRESTOR ) 40  MG tablet, TAKE 1 TABLET BY MOUTH EVERY DAY, Disp: 90 tablet, Rfl: 3   tiZANidine  (ZANAFLEX ) 4 MG tablet, Take 1 tablet (4 mg total) by mouth every 6 (six) hours as needed for muscle spasms., Disp: 30 tablet, Rfl: 0   traZODone  (DESYREL ) 50 MG tablet, TAKE 0.5-1 TABLETS BY MOUTH AT BEDTIME AS NEEDED FOR SLEEP., Disp: 90 tablet, Rfl: 2   Vitamin D , Ergocalciferol , (DRISDOL ) 1.25 MG (50000 UNIT) CAPS capsule, TAKE 1 CAPSULE (50,000 UNITS TOTAL) BY MOUTH EVERY 7 (SEVEN) DAYS, Disp: 5 capsule, Rfl: 2  Observations/Objective: Patient is well-developed, well-nourished in no acute distress.  Resting comfortably at home.  Head is normocephalic, atraumatic.  No labored breathing.  Speech is clear and coherent with logical content.  Patient is alert and oriented at baseline.    Assessment and Plan: 1. Viral bronchitis (Primary) - brompheniramine-pseudoephedrine-DM 30-2-10 MG/5ML syrup;  Take 5 mLs by mouth 4 (four) times daily as needed.  Dispense: 480 mL; Refill: 0 - benzonatate  (TESSALON ) 200 MG capsule; Take 1 capsule (200 mg total) by mouth 2 (two) times daily as needed for cough.  Dispense: 20 capsule; Refill: 0  INSTRUCTIONS: use a humidifier for nasal congestion Drink plenty of fluids, rest and wash hands frequently to avoid the spread of infection Alternate tylenol  and Motrin  for relief of fever   Follow Up Instructions: I discussed the assessment and treatment plan with the patient. The patient was provided an opportunity to ask questions and all were answered. The patient agreed with the plan and demonstrated an understanding of the instructions.  A copy of instructions were sent to the patient via MyChart unless otherwise noted below.     The patient was advised to call back or seek an in-person evaluation if the symptoms worsen or if the condition fails to improve as anticipated.    Veola Cafaro W Sander Speckman, NP

## 2024-03-13 NOTE — Patient Instructions (Signed)
 Kirk [name], thank you for joining Haze LELON Servant, NP for today's virtual visit.  While this provider is not your primary care provider (PCP), if your PCP is located in our provider database this encounter information will be shared with them immediately following your visit.   A [facility] MyChart account gives you access to today's visit and all your visits, tests, and labs performed at [facility]  click here if you don't have a [facility] MyChart account or go to [email or web address]  Consent: (Patient) Kirk [name] provided verbal consent for this virtual visit at the beginning of the encounter.  Current Medications:  Current Outpatient Medications:    benzonatate  (TESSALON ) 200 MG capsule, Take 1 capsule (200 mg total) by mouth 2 (two) times daily as needed for cough., Disp: 20 capsule, Rfl: 0   brompheniramine-pseudoephedrine-DM 30-2-10 MG/5ML syrup, Take 5 mLs by mouth 4 (four) times daily as needed., Disp: 480 mL, Rfl: 0   acetaminophen  (TYLENOL ) 500 MG tablet, Take 500 mg by mouth every 6 (six) hours as needed for moderate pain or fever. , Disp: , Rfl:    albuterol  (VENTOLIN  HFA) 108 (90 Base) MCG/ACT inhaler, Inhale 2 puffs into the lungs every 6 (six) hours as needed for wheezing or shortness of breath., Disp: 8 g, Rfl: 0   aspirin  EC 81 MG tablet, Take 81 mg by mouth at bedtime., Disp: , Rfl:    Blood Glucose Monitoring Suppl (ACCU-CHEK GUIDE) w/Device KIT, USE UP TO FOUR TIMES DAILY AS DIRECTED. (FOR ICD-10 E10.9, E11.9)., Disp: 1 kit, Rfl: 0   carvedilol  (COREG ) 12.5 MG tablet, Take 1 tablet (12.5 mg total) by mouth 2 (two) times daily with a meal., Disp: 180 tablet, Rfl: 1   clopidogrel  (PLAVIX ) 75 MG tablet, TAKE 1 TABLET BY MOUTH EVERY DAY, Disp: 90 tablet, Rfl: 1   diphenhydrAMINE  (BENADRYL ) 25 mg capsule, Take 50 mg by mouth every 6 (six) hours as needed., Disp: , Rfl:    docusate sodium  (COLACE) 100 MG capsule, TAKE 1 CAPSULE (100 MG TOTAL) BY  MOUTH 2 (TWO) TIMES DAILY AS NEEDED FOR MILD CONSTIPATION., Disp: 180 capsule, Rfl: 0   EPINEPHrine  0.3 mg/0.3 mL IJ SOAJ injection, Inject 0.3 mg into the muscle as needed for anaphylaxis., Disp: 1 each, Rfl: 0   gabapentin  (NEURONTIN ) 300 MG capsule, TAKE 1 CAPSULE BY MOUTH THREE TIMES A DAY, Disp: 270 capsule, Rfl: 3   glimepiride  (AMARYL ) 2 MG tablet, TAKE 1 TABLET BY MOUTH DAILY WITH BREAKFAST, Disp: 90 tablet, Rfl: 2   Glucose Blood (BLOOD GLUCOSE TEST STRIPS) STRP, 1 each by In Vitro route 3 (three) times daily with meals. May substitute to any manufacturer covered by patient's insurance., Disp: 100 strip, Rfl: 3   metFORMIN  (GLUCOPHAGE -XR) 500 MG 24 hr tablet, Take 1 tablet (500 mg total) by mouth 2 (two) times daily with a meal., Disp: 180 tablet, Rfl: 3   Misc. Devices (ROLLATOR ULTRA-LIGHT) MISC, 1 each by Does not apply route daily., Disp: 1 each, Rfl: 0   Multiple Vitamins-Minerals (CENTRUM ADULTS) TABS, Take 1 tablet by mouth daily., Disp: 90 tablet, Rfl: 1   nitroGLYCERIN  (NITROSTAT ) 0.4 MG SL tablet, Place 1 tablet (0.4 mg total) under the tongue every 5 (five) minutes x 3 doses as needed for chest pain., Disp: 25 tablet, Rfl: 2   rosuvastatin  (CRESTOR ) 40 MG tablet, TAKE 1 TABLET BY MOUTH EVERY DAY, Disp: 90 tablet, Rfl: 3   tiZANidine  (ZANAFLEX ) 4 MG tablet, Take 1 tablet (4  mg total) by mouth every 6 (six) hours as needed for muscle spasms., Disp: 30 tablet, Rfl: 0   traZODone  (DESYREL ) 50 MG tablet, TAKE 0.5-1 TABLETS BY MOUTH AT BEDTIME AS NEEDED FOR SLEEP., Disp: 90 tablet, Rfl: 2   Vitamin D , Ergocalciferol , (DRISDOL ) 1.25 MG (50000 UNIT) CAPS capsule, TAKE 1 CAPSULE (50,000 UNITS TOTAL) BY MOUTH EVERY 7 (SEVEN) DAYS, Disp: 5 capsule, Rfl: 2   Medications ordered in this encounter:  Meds ordered this encounter  Medications   brompheniramine-pseudoephedrine-DM 30-2-10 MG/5ML syrup    Sig: Take 5 mLs by mouth 4 (four) times daily as needed.    Dispense:  480 mL    Refill:  0     Supervising Provider:   BLAISE ALEENE KIDD [id number]   benzonatate  (TESSALON ) 200 MG capsule    Sig: Take 1 capsule (200 mg total) by mouth 2 (two) times daily as needed for cough.    Dispense:  20 capsule    Refill:  0    Supervising Provider:   BLAISE ALEENE KIDD [[id number]]     *If you need refills on other medications prior to your next appointment, please contact your pharmacy*  Follow-Up: Call back or seek an in-person evaluation if the symptoms worsen or if the condition fails to improve as anticipated.  [facility] [phone]  Other Instructions INSTRUCTIONS: use a humidifier for nasal congestion Drink plenty of fluids, rest and wash hands frequently to avoid the spread of infection Alternate tylenol  and Motrin  for relief of fever    If you have been instructed to have an in-person evaluation today at a local Urgent Care facility, please use the link below. It will take you to a list of all of our available [facility], including address, phone number and hours of operation. Please do not delay care.  [facility]  If you or a family member do not have a primary care provider, use the link below to schedule a visit and establish care. When you choose a [facility] primary care physician or advanced practice provider, you gain a long-term partner in health. Find a Primary Care Provider  Learn more about [facility] in-office and virtual care options: [facility] - Get Care Now

## 2024-04-02 ENCOUNTER — Other Ambulatory Visit: Payer: Self-pay | Admitting: Nurse Practitioner

## 2024-04-06 ENCOUNTER — Other Ambulatory Visit: Payer: Self-pay | Admitting: Nurse Practitioner

## 2024-04-06 DIAGNOSIS — E1151 Type 2 diabetes mellitus with diabetic peripheral angiopathy without gangrene: Secondary | ICD-10-CM

## 2024-04-06 NOTE — Telephone Encounter (Signed)
 Please advise [facility]

## 2024-04-07 ENCOUNTER — Ambulatory Visit (HOSPITAL_COMMUNITY)
Admission: RE | Admit: 2024-04-07 | Discharge: 2024-04-07 | Disposition: A | Discharge status: Home / Self Care | Source: Ambulatory Visit | Attending: Internal Medicine | Admitting: Internal Medicine

## 2024-04-07 DIAGNOSIS — R0609 Other forms of dyspnea: Secondary | ICD-10-CM | POA: Diagnosis not present

## 2024-04-07 LAB — ECHOCARDIOGRAM COMPLETE
Area-P 1/2: 3.37 cm2
S' Lateral: 2.2 cm

## 2024-04-15 ENCOUNTER — Ambulatory Visit (HOSPITAL_COMMUNITY)
Admission: RE | Admit: 2024-04-15 | Discharge: 2024-04-15 | Disposition: A | Discharge status: Home / Self Care | Source: Ambulatory Visit | Attending: Nurse Practitioner | Admitting: Nurse Practitioner

## 2024-04-15 ENCOUNTER — Ambulatory Visit (INDEPENDENT_AMBULATORY_CARE_PROVIDER_SITE_OTHER): Payer: Self-pay | Admitting: Nurse Practitioner

## 2024-04-15 ENCOUNTER — Encounter: Payer: Self-pay | Admitting: Nurse Practitioner

## 2024-04-15 ENCOUNTER — Ambulatory Visit: Payer: Self-pay | Admitting: Nurse Practitioner

## 2024-04-15 VITALS — BP 109/55 | HR 71

## 2024-04-15 DIAGNOSIS — M25551 Pain in right hip: Secondary | ICD-10-CM | POA: Insufficient documentation

## 2024-04-15 DIAGNOSIS — E1151 Type 2 diabetes mellitus with diabetic peripheral angiopathy without gangrene: Secondary | ICD-10-CM | POA: Diagnosis not present

## 2024-04-15 LAB — POCT GLYCOSYLATED HEMOGLOBIN (HGB A1C): Hemoglobin A1C: 8.3 % — AB (ref 4.0–5.6)

## 2024-04-15 MED ORDER — TRIAMCINOLONE ACETONIDE 0.1 % EX CREA: 1.0000 | TOPICAL_CREAM | Freq: Two times a day (BID) | CUTANEOUS | 0 refills | Status: AC

## 2024-04-15 NOTE — Progress Notes (Signed)
 Subjective   Patient ID: Kirk [name], male    DOB: [birth year], 68 y.o.   MRN: [id number]  Chief Complaint  Patient presents with   Medical Management of Chronic Issues   Diabetes    Referring provider: Oley Bascom RAMAN, NP  Kirk [name] is a 68 y.o. male with Past Medical History: No date: Arthritis     Comment:  right knee (09/04/2015) 04/19/2019: Atrial flutter with rapid ventricular response (HCC) 10/2019: Dizziness No date: Dyspnea     Comment:  increased exertion 07/2020: Elevated serum creatinine No date: HTN (hypertension) No date: Hyperlipidemia No date: Kidney disease     Comment:  stage 3B 05/2019: Lung cancer (HCC) 07/2020: Microalbuminuria 09/04/2015: NSTEMI (non-ST elevated myocardial infarction) (HCC)     Comment:  a. cath 09/05/2015: 95% stenosis mid-LAD, 55% mid-RCA,               and 40% prox-RCA. PCI performed w/ Synergy DES to LAD No date: PAD (peripheral artery disease)     Comment:  Stenting of bilateral iliacs in 2003. No date: Refusal of blood transfusions as patient is Jehovah's Witness No date: Type II diabetes mellitus (HCC) 07/2020: Vitamin D  deficiency   HPI  Diagnosed and treated for stage 4 lung cancer. States that he completed six rounds of chemotherapy and oral therapy. Was informed by oncologist that cancer is stable, but not in remission. Completes follow up every 3 months for reevaluation of lung cancer. lung cancer still stable. Also, followed by nephrology.      Diabetes Mellitus: Patient presents for follow up of diabetes. Symptoms: none.  Patient denies foot ulcerations, hypoglycemia , nausea, and polydipsia.  Evaluation to date has been included: hemoglobin A1C.  Home sugars: BG. Treatment to date: no recent interventions. Denies monitoring meals, but walks his dog daily. Hgb A1c 8.3. Is compliant with medications.  Although he is currently having issues getting Jardiance  filled due to insurance.  Patient does follow with  nephrology. Will increase metformin  to twice daily. Has only been taking once daily.   Note: Patient complains of rash to left hip.  He states that this is itching.  He has not had pain with the rash.  It does appear to be clearing up but we will order Kenalog cream to use as needed.  Patient also complains of right hip pain that is intermittent.  We will order hip x-ray.   Denies any other concerns today. Denies any fatigue, chest pain, shortness of breath, HA or dizziness. Denies any blurred vision, numbness or tingling.     Allergies  Allergen Reactions   Bee Venom Hives   Seasonal Ic [Octacosanol]     Immunization History  Administered Date(s) Administered   Fluad Quad(high Dose 65+) 03/21/2020, 03/15/2021, 03/08/2022   Fluad Trivalent(High Dose 65+) 03/18/2023   INFLUENZA, HIGH DOSE SEASONAL PF 03/22/2019   PFIZER(Purple Top)SARS-COV-2 Vaccination 01/18/2020, 07/05/2020, 08/02/2020, 09/27/2020   Pneumococcal Conjugate-13 03/22/2019    Tobacco History: Social History   Tobacco Use  Smoking Status Former   Current packs/day: 0.00   Average packs/day: 2.0 packs/day for 35.0 years (70.0 ttl pk-yrs)   Types: Cigarettes   Start date: 02/19/1968   Quit date: 02/19/2003   Years since quitting: 21.1  Smokeless Tobacco Never   Counseling given: Not Answered   Outpatient Encounter Medications as of 04/15/2024  Medication Sig   acetaminophen  (TYLENOL ) 500 MG tablet Take 500 mg by mouth every 6 (six) hours as needed for moderate pain or  fever.    albuterol  (VENTOLIN  HFA) 108 (90 Base) MCG/ACT inhaler Inhale 2 puffs into the lungs every 6 (six) hours as needed for wheezing or shortness of breath.   aspirin  EC 81 MG tablet Take 81 mg by mouth at bedtime.   Blood Glucose Monitoring Suppl (ACCU-CHEK GUIDE) w/Device KIT USE UP TO FOUR TIMES DAILY AS DIRECTED. (FOR ICD-10 E10.9, E11.9).   brompheniramine-pseudoephedrine-DM 30-2-10 MG/5ML syrup Take 5 mLs by mouth 4 (four) times daily as  needed.   carvedilol  (COREG ) 12.5 MG tablet Take 1 tablet (12.5 mg total) by mouth 2 (two) times daily with a meal.   clopidogrel  (PLAVIX ) 75 MG tablet TAKE 1 TABLET BY MOUTH EVERY DAY   diphenhydrAMINE  (BENADRYL ) 25 mg capsule Take 50 mg by mouth every 6 (six) hours as needed.   docusate sodium  (COLACE) 100 MG capsule TAKE 1 CAPSULE (100 MG TOTAL) BY MOUTH 2 (TWO) TIMES DAILY AS NEEDED FOR MILD CONSTIPATION.   EPINEPHrine  0.3 mg/0.3 mL IJ SOAJ injection Inject 0.3 mg into the muscle as needed for anaphylaxis.   gabapentin  (NEURONTIN ) 300 MG capsule TAKE 1 CAPSULE BY MOUTH THREE TIMES A DAY   glimepiride  (AMARYL ) 2 MG tablet TAKE 1 TABLET BY MOUTH DAILY WITH BREAKFAST   Glucose Blood (BLOOD GLUCOSE TEST STRIPS) STRP 1 each by In Vitro route 3 (three) times daily with meals. May substitute to any manufacturer covered by patient's insurance.   metFORMIN  (GLUCOPHAGE -XR) 500 MG 24 hr tablet Take 1 tablet (500 mg total) by mouth 2 (two) times daily with a meal.   Misc. Devices (ROLLATOR ULTRA-LIGHT) MISC 1 each by Does not apply route daily.   Multiple Vitamins-Minerals (CENTRUM ADULTS) TABS Take 1 tablet by mouth daily.   nitroGLYCERIN  (NITROSTAT ) 0.4 MG SL tablet Place 1 tablet (0.4 mg total) under the tongue every 5 (five) minutes x 3 doses as needed for chest pain.   rosuvastatin  (CRESTOR ) 40 MG tablet TAKE 1 TABLET BY MOUTH EVERY DAY   tiZANidine  (ZANAFLEX ) 4 MG tablet Take 1 tablet (4 mg total) by mouth every 6 (six) hours as needed for muscle spasms.   traZODone  (DESYREL ) 50 MG tablet TAKE 0.5-1 TABLETS BY MOUTH AT BEDTIME AS NEEDED FOR SLEEP.   triamcinolone cream (KENALOG) 0.1 % Apply 1 Application topically 2 (two) times daily.   Vitamin D , Ergocalciferol , (DRISDOL ) 1.25 MG (50000 UNIT) CAPS capsule TAKE 1 CAPSULE (50,000 UNITS TOTAL) BY MOUTH EVERY 7 (SEVEN) DAYS   benzonatate  (TESSALON ) 200 MG capsule Take 1 capsule (200 mg total) by mouth 2 (two) times daily as needed for cough. (Patient  not taking: Reported on 04/15/2024)   No facility-administered encounter medications on file as of 04/15/2024.    Review of Systems  Review of Systems  Constitutional: Negative.   HENT: Negative.    Cardiovascular: Negative.   Gastrointestinal: Negative.   Allergic/Immunologic: Negative.   Neurological: Negative.   Psychiatric/Behavioral: Negative.       Objective:   BP (!) 109/55   Pulse 71   SpO2 97%   Wt Readings from Last 5 Encounters:  03/02/24 220 lb (99.8 kg)  02/27/24 202 lb (91.6 kg)  01/15/24 219 lb (99.3 kg)  01/14/24 217 lb (98.4 kg)  12/04/23 221 lb 5.5 oz (100.4 kg)     Physical Exam Vitals and nursing note reviewed.  Constitutional:      General: He is not in acute distress.    Appearance: He is well-developed.  Cardiovascular:     Rate and Rhythm: Normal  rate and regular rhythm.  Pulmonary:     Effort: Pulmonary effort is normal.     Breath sounds: Normal breath sounds.  Skin:    General: Skin is warm and dry.  Neurological:     Mental Status: He is alert and oriented to person, place, and time.       Assessment & Plan:   Diabetes mellitus with peripheral vascular disease (HCC) -     POCT glycosylated hemoglobin (Hb A1C)  Right hip pain -     DG HIP UNILAT W OR W/O PELVIS 2-3 VIEWS RIGHT  Other orders -     Triamcinolone  Acetonide; Apply 1 Application topically 2 (two) times daily.  Dispense: 30 g; Refill: 0     Return in about 3 months (around 07/16/2024).   Bascom GORMAN Borer, NP 04/15/2024

## 2024-04-19 ENCOUNTER — Telehealth: Payer: Self-pay | Admitting: Nurse Practitioner

## 2024-04-19 NOTE — Telephone Encounter (Signed)
 Copied from CRM #[id number]. Topic: Clinical - Lab/Test Results >> Apr 19, 2024  2:55 PM Leonette P wrote: Reason for CRM: Pt is calling to see if the xrays of his rt hip are back.  Please advise  [phone]

## 2024-04-20 NOTE — Telephone Encounter (Signed)
 Please advise [facility]

## 2024-04-25 ENCOUNTER — Ambulatory Visit: Payer: Self-pay | Admitting: Nurse Practitioner

## 2024-04-26 ENCOUNTER — Other Ambulatory Visit: Payer: Self-pay | Admitting: Nurse Practitioner

## 2024-04-26 DIAGNOSIS — M1610 Unilateral primary osteoarthritis, unspecified hip: Secondary | ICD-10-CM

## 2024-04-26 MED ORDER — DICLOFENAC SODIUM 1 % EX GEL: 2.0000 g | Freq: Four times a day (QID) | CUTANEOUS | 2 refills | Status: AC

## 2024-04-28 ENCOUNTER — Telehealth: Payer: Self-pay | Admitting: *Deleted

## 2024-04-28 NOTE — Telephone Encounter (Signed)
 Patient called - LVM: He is out of town and will not be here 04/29/24 @ 1 pm for his port flush appt. He needs to cancel it and r/s appt. Contacted patient - LVM: His appt for tomorrow has been cancelled. Scheduling will contact him to reschedule. Schedule message sent.

## 2024-04-29 ENCOUNTER — Inpatient Hospital Stay: Payer: Medicare Other

## 2024-04-30 ENCOUNTER — Telehealth: Payer: Self-pay | Admitting: Internal Medicine

## 2024-04-30 NOTE — Telephone Encounter (Signed)
 Rescheduled patients port flush appointment since he is out of town. Called and spoke with the patient to discuss a new day and time. The patient is aware of the new appointment.

## 2024-05-01 NOTE — Progress Notes (Deleted)
 Cardiology Office Note:    Date:  05/01/2024   ID:  Kirk [name], DOB Mar 03, 1956, MRN [id number]  PCP:  Oley Bascom RAMAN, NP  Cardiologist:  Lonni Cash, MD Cardiology APP:  Lucien Orren SAILOR, PA-C { Click to update primary MD,subspecialty MD or APP then REFRESH:1}    Referring MD: Oley Bascom RAMAN, NP   Chief Complaint: follow-up of frequent PVCs  History of Present Illness:    Kirk [name] is a 68 y.o. male with a history of CAD with NSTEMI in 08/2015 s/p DES to LAD, paroxysmal atrial flutter (no longer on Eliquis  due to cost and patient preference), PAD s/p remote stenting to bilateral iliacs in 2003, hypertension, hyperlipidemia, type 2 diabetes melitis, CKD stage IIIb, and non-small cell lung cancer (diagnosed in 04/2019) who is followed by Dr. Cash and presents today for follow-up of frequent PVCs.  Patient was admitted in 08/2015 with NSTEMI.  LHC showed 95% stenosis of mid LAD with otherwise mild to moderate disease.  He underwent successful PCI with DES to mid LAD lesion.  Echo at that time showed LVEF of 60-65% with normal wall motion as well as moderate basal septal hypertrophy with SAM and LVOT acceleration.  Myoview  was ordered in 08/2017 for further evaluation of dyspnea and was low risk.  He was admitted in 03/2019 for new onset atrial flutter.  Echo showed LVEF of 60-65% with mild LVH and no significant valvular disease.  He was started on Eliquis  but subsequently stopped this due to the cost and has not wanted to restart it (has also not want to just try Coumadin).  He also has a history of PAD with remote stenting to bilateral iliacs in 2003. Last ABIs in 07/2017 were moderately reduced bilaterally (0.63 on the right and 0.61 on the left).  He was last seen by Orren Lucien in 02/2024 at which time he was noted to have frequent PVCs on EKG and bigeminy pattern.  He reported fatigue and occasional dizziness as well as chronic shortness of breath due to lung cancer  and chemotherapy.  His Coreg  was increased and a 2-week ZIO monitor was ordered to assess PVC burden.  Echo was also ordered to reassess LV function.  He was seen in the ED a few days later for intermittent chest pain and shortness of breath.  EKG showed normal sinus rhythm with no PVCs and no acute ischemic changes.  High-sensitivity troponin negative x 2.  He is felt to be stable for discharge without admission.  Echo on 04/07/2024 showed LVEF of 60-65% with severe asymmetric LVH of the basal septal segment, normal RV function, and no significant valvular disease.  Patient presents today for follow-up. ***  CAD History of NSTEMI in 08/2015 s/p DES to LAD.  Myoview  in 2019 was low risk. - *** - He has been continued on DAPT with Aspirin  81 mg daily and Plavix  75 mg daily. - Continue Crestor  40 mg daily  Frequent PVCs Patient was noted to have bigeminy PVCs on EKG at last visit in 02/2024.  Monitor showed ***.  Echo showed normal LVEF. - *** - Continue Coreg  12.5 mg twice daily.  Paroxysmal Atrial Flutter Diagnosed in 03/2019.  Initially was on Eliquis  but patient stopped this on his own due to cost and has declined to restart this or try Coumadin.  Thankfully, he has not had any documented recurrence.  Recent monitor *** - Maintaining sinus rhythm on exam.  - Continue Coreg  12.5 mg twice daily. -  CHA2DS2-VASc = 4 (CAD, HTN, DM, age x1). Not on anticoagulation as above. However, if he has any documented recurrence, we will need to rediscuss anticoagulation.  Severe LVH Echo in 08/2015 at time of NSTEMI showed moderate basal septal hypertrophy with SAM and LVOT acceleration.  Recent echo in 11/25 showed normal LV function with severe asymmetric LVH of the basal septal. - No signs or symptoms of CHF. Euvolemic on exam. *** - Dr. Santo ***  Hypertension BP ***  - Continue Coreg  12.5 mg twice daily.  Hyperlipidemia Lipid panel in 06/2022: Total Cholesterol 124, Triglycerides 195, HDL  30, LDL 61. LDL goal <55. - Continue Crestor  40mg  daily. - Will repeat lipid panel and LFTs. ***  Type 2 Diabetes Mellitus Hemoglobin A1c 8.3% in 03/2024.  - On Metformin  and Glimepiride . - Management per PCP.  CKD Stage IIIb Baseline creatinine fluctuates between 1.5 to 2.1.  Most recently has been around 2.0. - Followed by Nephrology.  EKGs/Labs/Other Studies Reviewed:    The following studies were reviewed:  Left Cardiac Catheterization 09/05/2015: Ost LM to LM lesion, 25% stenosed. Mid Cx lesion, 30% stenosed. Dist Cx lesion, 30% stenosed. Prox RCA lesion, 40% stenosed. Mid RCA lesion, 55% stenosed. Mid LAD lesion, 95% stenosed. Post intervention, there is a 0% residual stenosis.   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   LVEDP 9 mm Hg.   Successful PCI to the LAD with ultimate insertion of a 3.528 mm Synergy DES stent postdilated to 4.11 mm at the site of stenosis with initial residual waist 4.03 mm at other sites with the 95% stenoses being reduced to 0% and brisk TIMI-3 flow. _______________  Myoview  09/03/2017: The left ventricular ejection fraction is hyperdynamic (>65%). Nuclear stress EF: 66%. Blood pressure demonstrated a normal response to exercise. Upsloping ST segment depression ST segment depression was noted during stress. No T wave inversion was noted during stress. Defect 1: There is a small defect of mild severity present in the mid inferior and apical inferior location. This may represent mild ischemia or diaphragmatic attenuation. Given the normal systolic function in this distribution, favor attenuation artifact. This is a low risk study. _______________  Monitor  ***  _______________  Echocardiogram 04/07/2024: Impressions:  1. Left ventricular ejection fraction, by estimation, is 60 to 65%. The  left ventricle has normal function. The left ventricle has no regional  wall motion abnormalities. There is severe asymmetric left ventricular  hypertrophy of the basal segment. Left  ventricular diastolic parameters were normal. The average left ventricular  global longitudinal strain is -16.0 %. The global longitudinal strain is  abnormal.   2. Right ventricular systolic function is normal. The right ventricular  size is normal.   3. The mitral valve is normal in structure. No evidence of mitral valve  regurgitation. No evidence of mitral stenosis.   4. The aortic valve was not well visualized. Aortic valve regurgitation  is not visualized. No aortic stenosis is present.   5. The inferior vena cava is normal in size with greater than 50%  respiratory variability, suggesting right atrial pressure of 3 mmHg.    EKG:  EKG not ordered today.   Recent Labs: 01/08/2024: ALT 14 02/27/2024: Magnesium 2.3 03/02/2024: BUN 21; Creatinine, Ser 2.05;  Hemoglobin 11.5; Platelets 225; Potassium 4.0; Sodium 138  Recent Lipid Panel    Component Value Date/Time   CHOL 124 07/11/2022 1059   TRIG 195 (H) 07/11/2022 1059   HDL 30 (L) 07/11/2022 1059   CHOLHDL 4.1 07/11/2022 1059   CHOLHDL 3.7 03/12/2016 0837   VLDL 17 03/12/2016 0837   LDLCALC 61 07/11/2022 1059    Physical Exam:    Vital Signs: There were no vitals taken for this visit.    Wt Readings from Last 3 Encounters:  03/02/24 220 lb (99.8 kg)  02/27/24 202 lb (91.6 kg)  01/15/24 219 lb (99.3 kg)     General: 68 y.o. male in no acute distress. HEENT: Normocephalic and atraumatic. Sclera clear.  Neck: Supple. No carotid bruits. No JVD. Heart: *** RRR. Distinct S1 and S2. No murmurs, gallops, or rubs.  Lungs: No increased work of breathing. Clear to ausculation bilaterally. No wheezes, rhonchi,  or rales.  Abdomen: Soft, non-distended, and non-tender to palpation.  Extremities: No lower extremity edema.  Radial and distal pedal pulses 2+ and equal bilaterally. Skin: Warm and dry. Neuro: No focal deficits. Psych: Normal affect. Responds appropriately.   Assessment:    No diagnosis found.  Plan:     Disposition: Follow up in ***   Signed, Aline FORBES Door, PA-C  05/01/2024 9:26 AM    [facility]

## 2024-05-01 NOTE — Progress Notes (Unsigned)
 Cardiology Office Note   Date:  04/27/2024  ID:  Kirk [name] 10/08/1992, MRN [id number] PCP: Jesus Bernardino MATSU, MD  [facility] Providers Cardiologist: Lonni Cash, MD Cardiology APP:  Lucien Orren SAILOR, PA-C { Click to update primary MD,subspecialty MD or APP then REFRESH:1}    Chief Complaint: [name] presents to the clinic for two-month follow-up.    Mr. Zanni established cardiology care in 2017 after NSTEMI requiring DES to proximal LAD. 08/2017 seen in office with DOE, nuclear stress test low risk. Admitted 03/2019 with new onset atrial flutter, discharged on Eliquis , diltiazem , and carvedilol . Unable to afford Eliquis  and noted significant constipation with diltiazem . Is Jehovah's Witness and declined further anticoagulation. Seen in office 02/27/24 for routine follow-up and noted frequent PVCs on EKG. 14 day Zio and echocardiogram ordered. Echo 04/07/24 showed LVEF 60-65%, severe asymmetric LVH of basal segment, RV normal, no valvular concerns.Presented to ER 03/02/24 with chest /pain. EKG with NSR 79 bpm with 1st degree AV block. Troponins negative. He was discharged.      History of Present Illness: Kirk [name] is a 68 y.o.male with PMH of CAD s/p DES to prox LAD, atrial flutter, PAD s/p bilateral LE stenting 2003, hypertension, hyperlipidemia, T2DM, former tobacco use, metastatic lung cancer who presents to the clinic for two-month follow-up. Today he ***  ? ED visit, cancer, Zio, FH heart disease, lipid panel,   CAD: Symptoms today include *** - Continue aspirin  81 mg daily - Continue carvedilol  12.5 mg BID - Continue Plavix  75 mg daily - Continue rosuvastatin  40 mg daily  Hypertension: BP *** today - Continue carvedilol  12.5 mg BID  Hyperlipidemia: 07/11/22 LDL 61. - Continue rosuvastatin  40 mg daily  ROS: Please see the history of present illness. All other systems reviewed and are negative.   Studies Reviewed: The following studies were  personally reviewed today ***: Cardiac Studies & Procedures   ______________________________________________________________________________________________ CARDIAC CATHETERIZATION  CARDIAC CATHETERIZATION 09/05/2015  Conclusion  Ost LM to LM lesion, 25% stenosed.  Mid Cx lesion, 30% stenosed.  Dist Cx lesion, 30% stenosed.  Prox RCA lesion, 40% stenosed.  Mid RCA lesion, 55% stenosed.  Mid LAD lesion, 95% stenosed. Post intervention, there is a 0% residual stenosis.  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  LVEDP 9 mm Hg.  Successful PCI to the LAD with ultimate insertion of a 3.528 mm Synergy DES stent postdilated to 4.11 mm at the site of stenosis with initial residual waist 4.03 mm at other sites with the 95% stenoses being reduced to 0% and brisk TIMI-3 flow.  Recommendation: The patient will continue with dual antiplatelet therapy ideally indefinitely.  A 2-D echo Doppler study will be performed to assess LV function.  Patient will be treated with high potency statin, ACE inhibitor, beta blocker therapy and possibly nitrate therapy.  Findings Coronary Findings Diagnostic  Dominance: Right  Left Main  Left Anterior Descending Thrombotic eccentric.  Left Circumflex  Right Coronary Artery  Intervention  Mid LAD lesion PCI The pre-interventional distal flow is normal (TIMI 3). Pre-stent angioplasty was performed. A drug-eluting stent was placed. The strut is apposed. Post-stent angioplasty was performed. The post-interventional distal flow is normal (TIMI 3). The intervention was successful. No  complications occurred at this lesion. Supplies used: BALLOON EMERGE MR 2.5X15; STENT SYNERGY DES 3.5X28;  BALLOON Hudson EMERGE MR 4.0X15; BALLOON [place] EMERGE MR 4.0X8 There is a 0% residual stenosis post intervention.   STRESS TESTS  MYOCARDIAL PERFUSION IMAGING 09/03/2017  Interpretation Summary  The left ventricular ejection fraction is hyperdynamic (>65%).  Nuclear stress EF: 66%.  Blood pressure demonstrated a normal response to exercise.  Upsloping ST segment depression ST segment depression was noted during stress.  No T wave inversion was noted during stress.  Defect 1: There is a small defect of mild severity present in the mid inferior and apical inferior location. This may represent mild ischemia or diaphragmatic attenuation. Given the normal systolic function in this distribution, favor attenuation artifact.  This is a low risk study.   ECHOCARDIOGRAM  ECHOCARDIOGRAM COMPLETE 04/07/2024  Narrative ECHOCARDIOGRAM REPORT    Patient Name:   Kirk [name] Date of Exam: 04/07/2024 Medical Rec #:  [id number]       Height:       75.0 in Accession #:    [id number]      Weight:       220.0 lb Date of Birth:  20-Aug-1955        BSA:          2.285 m Patient Age:    68 years        BP:           136/72 mmHg Patient Gender: M               HR:           72 bpm. Exam Location:  [address]  Procedure: 2D Echo, Cardiac Doppler, Color Doppler, 3D Echo and Strain Analysis (Both Spectral and Color Flow Doppler were utilized during procedure).  Indications:    Dyspnea R06.00  History:        Patient has prior history of Echocardiogram examinations, most recent 04/20/2019. Previous Myocardial Infarction, Arrythmias:Atrial Flutter; Risk Factors:Hypertension, Dyslipidemia and Diabetes.  Sonographer:    Nolon Berg BA, RDCS Referring Phys: [id number] TESSA N CONTE  IMPRESSIONS   1. Left ventricular ejection fraction, by estimation, is 60 to 65%. The left ventricle has normal function. The left ventricle has no regional wall motion abnormalities. There is severe asymmetric left  ventricular hypertrophy of the basal segment. Left ventricular diastolic parameters were normal. The average left ventricular global longitudinal strain is -16.0 %. The global longitudinal strain is abnormal. 2. Right ventricular systolic function is normal. The right ventricular size is normal. 3. The mitral valve is normal in structure. No evidence of mitral valve regurgitation. No evidence of mitral stenosis. 4. The aortic valve was not well visualized. Aortic valve regurgitation is not visualized. No aortic stenosis is present. 5. The inferior vena cava is normal in size with greater than 50% respiratory variability, suggesting right atrial pressure of 3 mmHg.  Comparison(s): No significant change from prior study. Prior images reviewed side by side.  FINDINGS Left Ventricle: Left ventricular ejection fraction, by estimation, is 60 to 65%. The left ventricle has normal function. The left ventricle has no regional wall motion abnormalities. The average left ventricular global longitudinal strain is -16.0 %. Strain was performed and the global longitudinal strain is abnormal. The left ventricular internal cavity size was normal in size. There is severe asymmetric left ventricular hypertrophy of the basal segment. Left ventricular diastolic parameters were normal.  Right Ventricle: The right ventricular size is normal. No increase in  right ventricular wall thickness. Right ventricular systolic function is normal.  Left Atrium: Left atrial size was normal in size.  Right Atrium: Right atrial size was normal in size.  Pericardium: There is no evidence of pericardial effusion.  Mitral Valve: The mitral valve is normal in structure. No evidence of mitral valve regurgitation. No evidence of mitral valve stenosis.  Tricuspid Valve: The tricuspid valve is normal in structure. Tricuspid valve regurgitation is not demonstrated. No evidence of tricuspid stenosis.  Aortic Valve: The aortic valve was  not well visualized. Aortic valve regurgitation is not visualized. No aortic stenosis is present.  Pulmonic Valve: The pulmonic valve was normal in structure. Pulmonic valve regurgitation is mild. No evidence of pulmonic stenosis.  Aorta: The aortic root is normal in size and structure.  Venous: The inferior vena cava is normal in size with greater than 50% respiratory variability, suggesting right atrial pressure of 3 mmHg.  IAS/Shunts: No atrial level shunt detected by color flow Doppler.   LEFT VENTRICLE PLAX 2D LVIDd:         3.95 cm   Diastology LVIDs:         2.20 cm   LV e' medial:    7.62 cm/s LV PW:         1.05 cm   LV E/e' medial:  9.3 LV IVS:        0.90 cm   LV e' lateral:   8.70 cm/s LVOT diam:     2.10 cm   LV E/e' lateral: 8.2 LV SV:         60 LV SV Index:   26        2D Longitudinal Strain LVOT Area:     3.46 cm  2D Strain GLS (A4C):   -15.2 % LV IVRT:       88 msec   2D Strain GLS (A3C):   -17.2 % 2D Strain GLS (A2C):   -15.6 % 2D Strain GLS Avg:     -16.0 %  RIGHT VENTRICLE             IVC RV Basal diam:  2.80 cm     IVC diam: 1.10 cm RV S prime:     13.10 cm/s TAPSE (M-mode): 3.2 cm      PULMONARY VEINS A Reversal Velocity: 35.70 cm/s Diastolic Velocity:  45.20 cm/s S/D Velocity:        1.00 Systolic Velocity:   46.00 cm/s  LEFT ATRIUM             Index        RIGHT ATRIUM          Index LA diam:        3.55 cm 1.55 cm/m   RA Area:     6.94 cm LA Vol (A2C):   32.0 ml 14.00 ml/m  RA Volume:   10.00 ml 4.38 ml/m LA Vol (A4C):   28.6 ml 12.52 ml/m LA Biplane Vol: 31.6 ml 13.83 ml/m AORTIC VALVE LVOT Vmax:   83.20 cm/s LVOT Vmean:  55.600 cm/s LVOT VTI:    0.174 m  AORTA Ao Root diam: 3.50 cm Ao Asc diam:  3.50 cm  MITRAL VALVE MV Area (PHT): 3.37 cm    SHUNTS MV Decel Time: 225 msec    Systemic VTI:  0.17 m MV E velocity: 71.10 cm/s  Systemic Diam: 2.10 cm MV A velocity: 79.00 cm/s MV E/A ratio:  0.90  Franck [employer]ist signed by Joelle  Azobou Tonleu Signature Date/Time: 04/07/2024/3:35:08 PM    Final          ______________________________________________________________________________________________       Risk Assessment/Calculations: {Does this patient have ATRIAL FIBRILLATION?:[phone]} No BP recorded.  {Refresh Note OR Click here to enter BP  :1}***       Physical Exam: There were no vitals taken for this visit. There were no vitals filed for this visit.   GEN: *** Well nourished, well developed in no acute distress NECK: No JVD; No carotid bruits CARDIAC: ***RRR, no murmurs, rubs, gallops RESPIRATORY:  Clear to auscultation without rales, wheezing or rhonchi  ABDOMEN: Soft, non-tender, non-distended EXTREMITIES:  No edema; No deformity   Assessment & Plan: ***    {Are you ordering a CV Procedure (e.g. stress test, cath, DCCV, TEE, etc)?   Press F2        :[id number]}  Dispo: ***  Signed, Saddie GORMAN Cleaves, NP 05/01/2024 9:07 PM [facility]

## 2024-05-03 ENCOUNTER — Ambulatory Visit: Attending: Student | Admitting: Student

## 2024-05-03 ENCOUNTER — Ambulatory Visit: Admitting: Physician Assistant

## 2024-05-03 ENCOUNTER — Encounter: Payer: Self-pay | Admitting: Student

## 2024-05-03 VITALS — BP 104/58 | HR 73 | Ht 75.0 in | Wt 223.2 lb

## 2024-05-03 DIAGNOSIS — N1832 Chronic kidney disease, stage 3b: Secondary | ICD-10-CM

## 2024-05-03 DIAGNOSIS — I2510 Atherosclerotic heart disease of native coronary artery without angina pectoris: Secondary | ICD-10-CM | POA: Diagnosis not present

## 2024-05-03 DIAGNOSIS — E785 Hyperlipidemia, unspecified: Secondary | ICD-10-CM

## 2024-05-03 DIAGNOSIS — I517 Cardiomegaly: Secondary | ICD-10-CM | POA: Diagnosis not present

## 2024-05-03 DIAGNOSIS — I493 Ventricular premature depolarization: Secondary | ICD-10-CM

## 2024-05-03 DIAGNOSIS — I480 Paroxysmal atrial fibrillation: Secondary | ICD-10-CM

## 2024-05-03 DIAGNOSIS — I10 Essential (primary) hypertension: Secondary | ICD-10-CM

## 2024-05-03 DIAGNOSIS — E118 Type 2 diabetes mellitus with unspecified complications: Secondary | ICD-10-CM | POA: Diagnosis not present

## 2024-05-03 NOTE — Patient Instructions (Signed)
 Thank you for choosing [facility]!     Medication Instructions:  No medication changes were made during today's visit.  *If you need a refill on your cardiac medications before your next appointment, please call your pharmacy*   Lab Work: Fasting lipid panel and liver function testing. If you have labs (blood work) drawn today and your tests are completely normal, you will receive your results only by: MyChart Message (if you have MyChart) OR A paper copy in the mail If you have any lab test that is abnormal or we need to change your treatment, we will call you to review the results.   Testing/Procedures: No procedures were ordered during today's visit.   Your next appointment:   6 month(s)   Provider:   Lonni Cash, MD or Aline Door     Follow-Up: At [facility], you and your health needs are our priority.  As part of our continuing mission to provide you with exceptional heart care, we have created designated Provider Care Teams.  These Care Teams include your primary Cardiologist (physician) and Advanced Practice Providers (APPs -  Physician Assistants and Nurse Practitioners) who all work together to provide you with the care you need, when you need it. We recommend signing up for the patient portal called MyChart.  Sign up information is provided on this After Visit Summary.  MyChart is used to connect with patients for Virtual Visits (Telemedicine).  Patients are able to view lab/test results, encounter notes, upcoming appointments, etc.  Non-urgent messages can be sent to your provider as well.   To learn more about what you can do with MyChart, go to [email or web address].

## 2024-05-04 ENCOUNTER — Ambulatory Visit: Payer: Self-pay | Admitting: Student

## 2024-05-04 LAB — HEPATIC FUNCTION PANEL
ALT: 17 IU/L (ref 0–44)
AST: 17 IU/L (ref 0–40)
Albumin: 4.6 g/dL (ref 3.9–4.9)
Alkaline Phosphatase: 81 IU/L (ref 47–123)
Bilirubin Total: 0.3 mg/dL (ref 0.0–1.2)
Bilirubin, Direct: 0.11 mg/dL (ref 0.00–0.40)
Total Protein: 7.6 g/dL (ref 6.0–8.5)

## 2024-05-04 LAB — LIPID PANEL
Chol/HDL Ratio: 3.1 ratio (ref 0.0–5.0)
Cholesterol, Total: 110 mg/dL (ref 100–199)
HDL: 36 mg/dL — ABNORMAL LOW (ref >39)
LDL Chol Calc (NIH): 51 mg/dL (ref 0–99)
Triglycerides: 132 mg/dL (ref 0–149)
VLDL Cholesterol Cal: 23 mg/dL (ref 5–40)

## 2024-05-06 ENCOUNTER — Inpatient Hospital Stay: Attending: Internal Medicine

## 2024-05-06 ENCOUNTER — Other Ambulatory Visit: Payer: Self-pay | Admitting: Physician Assistant

## 2024-05-06 ENCOUNTER — Other Ambulatory Visit: Payer: Self-pay

## 2024-05-06 DIAGNOSIS — C3490 Malignant neoplasm of unspecified part of unspecified bronchus or lung: Secondary | ICD-10-CM

## 2024-05-19 ENCOUNTER — Encounter: Payer: Self-pay | Admitting: Orthopaedic Surgery

## 2024-05-19 ENCOUNTER — Ambulatory Visit: Admitting: Orthopaedic Surgery

## 2024-05-19 DIAGNOSIS — M25551 Pain in right hip: Secondary | ICD-10-CM | POA: Diagnosis not present

## 2024-05-19 NOTE — Progress Notes (Signed)
 The patient is a 68 year old gentleman who comes in to see me after 4 months of hip pain on the right hip.  He reports pain on the lateral aspect of his right hip but it really looks like it seems to be more in the groin even though he is denying groin pain.  He walks with a slight limp.  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  He does have other medical issues.  He is in regular follow-up for lung cancer and he is a diabetic and has a hemoglobin A1c of about 8.3.  I did review his past medical history and medications within epic.  On exam his right hip is stiff with internal rotation but not with external rotation.  There is pain in the groin.  There is minimal pain over the lateral aspect of the right hip.  An AP pelvis and a lateral the right hip that are on the canopy system does show moderate arthritis of the right hip.  There are screws in the left hip with some arthritis in the left hip as well.  We went over the x-ray findings with the patient today.  I do feel that he would benefit from a one-time intra-articular steroid injection under ultrasound in his right hip.  We will see if we can get this set up through my partner Dr. Burnetta.  Once Dr. Burnetta is able to see him in place that injection in his right hip the patient can follow-up with me 2 to 4 weeks later.  He agrees with that treatment plan.  All questions and concerns were answered addressed.  I did counsel him about his blood glucose.

## 2024-05-26 ENCOUNTER — Ambulatory Visit: Admitting: Sports Medicine

## 2024-05-26 ENCOUNTER — Other Ambulatory Visit: Payer: Self-pay | Admitting: Nurse Practitioner

## 2024-05-26 DIAGNOSIS — M25551 Pain in right hip: Secondary | ICD-10-CM

## 2024-05-26 DIAGNOSIS — M545 Low back pain, unspecified: Secondary | ICD-10-CM

## 2024-06-01 ENCOUNTER — Encounter: Payer: Self-pay | Admitting: Internal Medicine

## 2024-06-01 ENCOUNTER — Other Ambulatory Visit: Payer: Self-pay

## 2024-06-01 ENCOUNTER — Other Ambulatory Visit (HOSPITAL_COMMUNITY): Payer: Self-pay

## 2024-06-01 DIAGNOSIS — D508 Other iron deficiency anemias: Secondary | ICD-10-CM

## 2024-06-01 NOTE — Progress Notes (Signed)
 "  06/01/2024 Name: Kirk [name] MRN: [id number] DOB: [birth year]  Chief Complaint  Patient presents with   Diabetes   Chronic Kidney Disease    Kirk [name] is a 69 y.o. year old male who presented for a telephone visit.   They were referred to the pharmacist by their PCP for assistance in managing diabetes. PMH includes HTN, PAD (bilateral ilac stents), T2DM, CAD s/p NSTEMI (2017 - multivessel CAD with 95% stenosis in the mid-LAD, 55% mid-RCA, and 40% prox-RCA. Successful PCI was performed with a Synergy DES to the LAD ), A flutter with RVR (stopped Eliquis  2/2 cost and does not wish to restart in the setting of metastatic cancer), adenocarcinoma of R lung (metastatic, stage 4, diagnosed Jan 2021) s/p chemo - currently stable, HLD.   Subjective: Attempted to reach him on 02/09/24, but appt was rescheduled to Jan 2026 when we could revisit coverage of SGLT2i Jardiance  for kidney protection. Unfortunately, patient visited the ED for CP on 03/02/24. He had been seen by cardiology on 02/27/24, and BP was 138/58 mmHg, HR 81. He had frequency PVCs and reported some occasional fatigue and dizziness. Carvedilol  was increased to 12.5 mg BID.  Echo was ordered (scheduled for Nov). He did not describe angina at that appointment. At ED visit, EKG and troponins were negative for ACS event. At pharmacy call on 03/10/24, patient was encouraged to schedule follow-up with [facility]. Advised to reduce metformin  to TDD of 1000 mg/day due to decline in kidney function. At most recent appt with cardiology on 05/03/24, patient denied recurrent chest pain, no med changes were made. BP was 104/58 mmHg.  Today, patient reports that he is doing well. He has a new financial controller with a lower Part D deductible. Took his info to the nephrology office and got a new Rx for Jardiance  which is plans [facility]. Initial fill > $200, but then copay will be affordable. Patient ok with this cost. Reports he has been trying  to get an appt with [facility] as he has began having craving for eating ice again. When this occurred in the past, he needed an iron infusion. First available appt was 06/17/24. Offered to schedule labs for iron studies this week, which patient agreed to.   Care Team: Primary Care Provider: Oley Bascom RAMAN, NP ; Next Scheduled Visit: 06/17/24 Oncology: Sherrod Sherrod, MD: Scheduled 07/14/24  Medication Access/Adherence  Current Pharmacy:  [facility] [phone] [address], [place] - [address] [place] [address] Phone: [phone] Fax: [phone]   Patient reports affordability concerns with their medications: Yes  - Jardiance  unaffordable on old insurance, patient reports new deductible is doable.  Patient reports access/transportation concerns to their pharmacy: No  Patient reports adherence concerns with their medications:  No    Diabetes:  Current medications: metformin  XR 500 mg twice daily, glimepiride  2 mg daily, Jardiance  25 mg daily (reports he is planning to [facility] today)  Current glucose readings:  Using Accu Chek meter; testing 1 times daily - patient does not have meter with him to go through readings.  Patient denies hypoglycemic s/sx including  shakiness, sweating. Endorses dizziness - but this is usual for him s/p chemo. He has checked his BG during dizzy spells before and the lowest he has seen is 80 mg/dL. Denies BG < 70 mg/dL recently. Patient denies hyperglycemic symptoms including polyuria, polydipsia, polyphagia, blurred vision. He has stable nocturia and neuropathy s/p chemotherapy.  Current meal patterns: Eating more vegetables, cut out on sweets - feels that changes in diet are biggest reason for decrease in A1C. - Drinks: mostly water during the day, occasional tea or soda  Current physical activity: Walks dog once daily.   Current medication access support: none - he reports that his income is > 300% FPL required for  [employer] or [employer] PAP programs. He is not interested in setting up the Medicare Payment Plan at this time.   Objective:  BP Readings from Last 3 Encounters:  05/03/24 (!) 104/58  04/15/24 (!) 109/55  03/02/24 121/60   UACR 10/13/23: 261 mg/g   Lab Results  Component Value Date   HGBA1C 8.3 (A) 04/15/2024   HGBA1C 7.3 (A) 01/14/2024   HGBA1C 7.7 (A) 10/13/2023    Lab Results  Component Value Date   CREATININE 2.05 (H) 03/02/2024   BUN 21 03/02/2024   NA 138 03/02/2024   K 4.0 03/02/2024   CL 104 03/02/2024   CO2 23 03/02/2024   eGFR 03/02/24 34 mL/min  Lab Results  Component Value Date   CHOL 110 05/03/2024   HDL 36 (L) 05/03/2024   LDLCALC 51 05/03/2024   TRIG 132 05/03/2024   CHOLHDL 3.1 05/03/2024    Medications Reviewed Today     Reviewed by Brinda Lorain SQUIBB, RPH-CPP (Pharmacist) on 06/01/24 at 1626  Med List Status: <None>   Medication Order Taking? Sig Documenting Provider Last Dose Status Informant  acetaminophen  (TYLENOL ) 500 MG tablet [id number]  Take 500 mg by mouth every 6 (six) hours as needed for moderate pain or fever.  [provider]  Active Self           Med Note SAMULE, [facility]   Thu Jul 11, 2022 10:21 AM) Prn   albuterol  (VENTOLIN  HFA) [phone] Base) MCG/ACT inhaler [id number]  Inhale 2 puffs into the lungs every 6 (six) hours as needed for wheezing or shortness of breath. Gladis Elsie JAYSON DEVONNA  Active   aspirin  EC 81 MG tablet [id number]  Take 81 mg by mouth at bedtime. [provider]  Active Self  benzonatate  (TESSALON ) 200 MG capsule [id number]  Take 1 capsule (200 mg total) by mouth 2 (two) times daily as needed for cough.  Patient not taking: Reported on 04/15/2024   Fleming, Zelda W, NP  Active   Blood Glucose Monitoring Suppl (ACCU-CHEK GUIDE) w/Device KIT [id number]  USE UP TO FOUR TIMES DAILY AS DIRECTED. (FOR ICD-10 E10.9, E11.9). Nichols, Tonya S, NP  Active   brompheniramine-pseudoephedrine-DM 30-2-10 MG/5ML syrup  [id number]  Take 5 mLs by mouth 4 (four) times daily as needed. Theotis Haze ORN, NP  Active   carvedilol  (COREG ) 12.5 MG tablet [id number]  Take 1 tablet (12.5 mg total) by mouth 2 (two) times daily with a meal. Lucien Orren SAILOR, PA-C  Active   clopidogrel  (PLAVIX ) 75 MG tablet [id number]  TAKE 1 TABLET BY MOUTH EVERY DAY Nichols, Tonya S, NP  Active   diclofenac  Sodium (VOLTAREN  ARTHRITIS PAIN) 1 % GEL [id number]  Apply 2 g topically 4 (four) times daily. Oley Bascom RAMAN, NP  Active   diphenhydrAMINE  (BENADRYL ) 25 mg capsule [id number]  Take 50 mg by mouth every 6 (six) hours as needed. [provider]  Active   docusate sodium  (COLACE) 100 MG capsule [id number]  TAKE 1 CAPSULE (100 MG TOTAL) BY MOUTH 2 (TWO) TIMES DAILY AS NEEDED FOR MILD CONSTIPATION. Oley Bascom RAMAN, NP  Active   EPINEPHrine  0.3 mg/0.3  mL IJ SOAJ injection [id number]  Inject 0.3 mg into the muscle as needed for anaphylaxis. Billy Asberry FALCON, PA-C  Active   gabapentin  (NEURONTIN ) 300 MG capsule [id number]  TAKE 1 CAPSULE BY MOUTH THREE TIMES A DAY Paseda, Folashade R, FNP  Active   glimepiride  (AMARYL ) 2 MG tablet [id number] Yes TAKE 1 TABLET BY MOUTH DAILY WITH BREAKFAST Nichols, Tonya S, NP  Active   Glucose Blood (BLOOD GLUCOSE TEST STRIPS) STRP [id number]  1 each by In Vitro route 3 (three) times daily with meals. May substitute to any manufacturer covered by patient's insurance. Oley Bascom RAMAN, NP  Active            Med Note DELORAS, HEATHER D   Wed Jul 30, 2023 11:06 AM) Testing as needed  JARDIANCE  25 MG TABS tablet [id number] Yes Take 25 mg by mouth daily. [provider]  Active   metFORMIN  (GLUCOPHAGE -XR) 500 MG 24 hr tablet [id number] Yes Take 1 tablet (500 mg total) by mouth 2 (two) times daily with a meal. Oley Bascom RAMAN, NP  Active   Misc. Devices (ROLLATOR [place]) MISC [id number]  1 each by Does not apply route daily. Oley Bascom RAMAN, NP  Active   Multiple Vitamins-Minerals (CENTRUM ADULTS) TABS  [id number]  Take 1 tablet by mouth daily. Oley Bascom RAMAN, NP  Active   nitroGLYCERIN  (NITROSTAT ) 0.4 MG SL tablet [id number]  PLACE 1 TABLET (0.4 MG TOTAL) UNDER THE TONGUE EVERY 5 (FIVE) MINUTES FOR 3 DOSES AS NEEDED FOR CHEST PAIN. Goodrich, Callie E, PA-C  Active   rosuvastatin  (CRESTOR ) 40 MG tablet [id number] Yes TAKE 1 TABLET BY MOUTH EVERY DAY Nichols, Tonya S, NP  Active   tiZANidine  (ZANAFLEX ) 4 MG tablet [id number]  Take 1 tablet (4 mg total) by mouth every 6 (six) hours as needed for muscle spasms. Oley Bascom RAMAN, NP  Active   traZODone  (DESYREL ) 50 MG tablet [id number]  TAKE 0.5-1 TABLETS BY MOUTH AT BEDTIME AS NEEDED FOR SLEEP. Oley Bascom RAMAN, NP  Active   triamcinolone  cream (KENALOG ) 0.1 % [id number]  Apply 1 Application topically 2 (two) times daily. Oley Bascom RAMAN, NP  Active   Vitamin D , Ergocalciferol , (DRISDOL ) 1.25 MG (50000 UNIT) CAPS capsule [id number]  TAKE 1 CAPSULE (50,000 UNITS TOTAL) BY MOUTH EVERY 7 (SEVEN) DAYS Oley Bascom RAMAN, NP  Active             Assessment/Plan:   Diabetes: - Currently uncontrolled with last A1C of 8.3%above goal < 8% given co-morbidities (Stage IV lung cancer, currently stable). Agree with initiation of SGLT2i for kidney protection, though will likely have minimal impact on glycemic control given reduced kidney function. Appropriate to continue metformin  TDD 1000 mg/day at this time. Will need to discontinue if eGFR < 30 mL/min. If he has episodes of hypoglycemia in the future, would discontinue glimepiride .  - UACR May 2025 - 261 mg/g - Reviewed long term cardiovascular and renal outcomes of uncontrolled blood sugar - Reviewed goal A1c, goal fasting, and goal 2 hour post prandial glucose - Reviewed dietary modifications including limited intake of carbohydrate-heavy foods, increasing protein intake, increasing water intake. - Reviewed lifestyle modifications including: increasing physical activity. - Recommend to continue metformin  XR 500  mg twice daily, glimepiride  2 mg daily. Monitor renal function closely as metformin  dose reduction may be warranted in the future.   - Recommend to START Jardiance  25 mg daily as prescribed by nephrology - Recommend to check glucose once daily fasting - Next A1C  due 07/16/24  Collaborated with PCP to place orders for iron studies for review prior to appt on 06/17/24, as patient was hoping to be evaluated earlier. Last iron infusion was done in Nov 2023.   Follow Up Plan: PCP 06/17/24, Pharmacist telephone 08/03/24   Lorain Baseman, PharmD [facility] [phone]     "

## 2024-06-02 ENCOUNTER — Other Ambulatory Visit: Payer: Self-pay

## 2024-06-02 DIAGNOSIS — D508 Other iron deficiency anemias: Secondary | ICD-10-CM

## 2024-06-03 ENCOUNTER — Other Ambulatory Visit: Payer: Self-pay | Admitting: Nurse Practitioner

## 2024-06-03 ENCOUNTER — Other Ambulatory Visit: Payer: Self-pay | Admitting: Physician Assistant

## 2024-06-03 ENCOUNTER — Ambulatory Visit: Admitting: Nurse Practitioner

## 2024-06-03 ENCOUNTER — Ambulatory Visit: Payer: Self-pay | Admitting: Nurse Practitioner

## 2024-06-03 DIAGNOSIS — C3490 Malignant neoplasm of unspecified part of unspecified bronchus or lung: Secondary | ICD-10-CM

## 2024-06-03 DIAGNOSIS — D509 Iron deficiency anemia, unspecified: Secondary | ICD-10-CM

## 2024-06-03 LAB — CBC WITH DIFFERENTIAL/PLATELET
Basophils Absolute: 0 x10E3/uL (ref 0.0–0.2)
Basos: 1 %
EOS (ABSOLUTE): 0.1 x10E3/uL (ref 0.0–0.4)
Eos: 3 %
Hematocrit: 37.6 % (ref 37.5–51.0)
Hemoglobin: 11.7 g/dL — ABNORMAL LOW (ref 13.0–17.7)
Immature Grans (Abs): 0 x10E3/uL (ref 0.0–0.1)
Immature Granulocytes: 0 %
Lymphocytes Absolute: 1.8 x10E3/uL (ref 0.7–3.1)
Lymphs: 36 %
MCH: 28.7 pg (ref 26.6–33.0)
MCHC: 31.1 g/dL — ABNORMAL LOW (ref 31.5–35.7)
MCV: 92 fL (ref 79–97)
Monocytes Absolute: 0.6 x10E3/uL (ref 0.1–0.9)
Monocytes: 12 %
Neutrophils Absolute: 2.4 x10E3/uL (ref 1.4–7.0)
Neutrophils: 48 %
Platelets: 230 x10E3/uL (ref 150–450)
RBC: 4.08 x10E6/uL — ABNORMAL LOW (ref 4.14–5.80)
RDW: 12.4 % (ref 11.6–15.4)
WBC: 4.9 x10E3/uL (ref 3.4–10.8)

## 2024-06-03 LAB — IRON,TIBC AND FERRITIN PANEL
Ferritin: 18 ng/mL — ABNORMAL LOW (ref 30–400)
Iron Saturation: 18 % (ref 15–55)
Iron: 75 ug/dL (ref 38–169)
Total Iron Binding Capacity: 416 ug/dL (ref 250–450)
UIBC: 341 ug/dL (ref 111–343)

## 2024-06-05 ENCOUNTER — Encounter: Payer: Self-pay | Admitting: Internal Medicine

## 2024-06-09 ENCOUNTER — Ambulatory Visit

## 2024-06-14 ENCOUNTER — Ambulatory Visit

## 2024-06-15 NOTE — Therapy (Unsigned)
 " OUTPATIENT PHYSICAL THERAPY THORACOLUMBAR EVALUATION   Patient Name: Kirk [name] MRN: [id number] DOB:07/01/1955, 69 y.o., male Today's Date: 06/16/2024  END OF SESSION:  PT End of Session - 06/16/24 0951     Visit Number 1    Authorization Type [employer] MRC    PT Start Time 1000    PT Stop Time 1038    PT Time Calculation (min) 38 min    Activity Tolerance Patient tolerated treatment well    Behavior During Therapy [facility] for tasks assessed/performed          Past Medical History:  Diagnosis Date   Arthritis    right knee (09/04/2015)   Atrial flutter with rapid ventricular response (HCC) 04/19/2019   Dizziness 10/2019   Dyspnea    increased exertion   Elevated serum creatinine 07/2020   HTN (hypertension)    Hyperlipidemia    Kidney disease    stage 3B   Lung cancer (HCC) 05/2019   Microalbuminuria 07/2020   NSTEMI (non-ST elevated myocardial infarction) (HCC) 09/04/2015   a. cath 09/05/2015: 95% stenosis mid-LAD, 55% mid-RCA, and 40% prox-RCA. PCI performed w/ Synergy DES to LAD   PAD (peripheral artery disease)    Stenting of bilateral iliacs in 2003.   Refusal of blood transfusions as patient is Jehovah's Witness    Type II diabetes mellitus (HCC)    Vitamin D  deficiency 07/2020   Past Surgical History:  Procedure Laterality Date   CARDIAC CATHETERIZATION N/A 09/05/2015   Procedure: Left Heart Cath and Coronary Angiography;  Surgeon: Debby DELENA Sor, MD;  Location: [facility] INVASIVE CV LAB;  Service: Cardiovascular;  Laterality: N/A;   CARDIAC CATHETERIZATION N/A 09/05/2015   Procedure: Coronary Stent Intervention;  Surgeon: Debby DELENA Sor, MD;  Location: [facility] INVASIVE CV LAB;  Service: Cardiovascular;  Laterality: N/A;   IR IMAGING GUIDED PORT INSERTION  07/15/2019   ORIF CONGENITAL HIP DISLOCATION Left ~ 1965   put pins in it   PERIPHERAL VASCULAR CATHETERIZATION Bilateral 2003   stenting   TONSILLECTOMY  ~ 1963   Patient Active Problem List   Diagnosis Date Noted    Upper respiratory tract infection 07/11/2022   Iron deficiency anemia 03/28/2022   Need for immunization against influenza 03/08/2022   Chemotherapy-induced neuropathy 05/31/2021   Drug-induced neutropenia 12/26/2020   Port-A-Cath in place 12/29/2019   Metastatic non-small cell lung cancer (HCC) 07/07/2019   Encounter for antineoplastic chemotherapy 07/07/2019   Encounter for antineoplastic immunotherapy 07/07/2019   Goals of care, counseling/discussion 06/28/2019   Adenocarcinoma of right lung, stage 4 (HCC) 06/28/2019   Cancer associated pain 06/28/2019   CAD (coronary artery disease) 06/08/2019   Mediastinal lymphadenopathy 06/03/2019   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) 06/03/2019   Hemoglobin A1C between 7% and 9% indicating borderline diabetic control (HCC) 05/10/2019   COVID-19 virus infection 05/10/2019   Atrial flutter with rapid ventricular response (HCC) 04/19/2019   Chest pain with moderate risk for cardiac etiology 04/19/2019   Paroxysmal atrial flutter (HCC) 04/19/2019   Chest congestion 07/10/2018   Cough 07/10/2018   NSTEMI (non-ST elevated myocardial infarction) (HCC) 09/04/2015   PVD (peripheral vascular disease) 06/08/2015   DDD (degenerative disc disease), cervical 06/08/2015   Hyperlipidemia 06/08/2015   Diabetes mellitus with nephropathy (HCC) 06/08/2015   Diabetes mellitus with peripheral vascular disease (HCC) 06/08/2015   Annual physical exam 01/03/2015   PAD (peripheral artery disease) 02/18/2013   Diabetes (HCC) 02/18/2013   HTN (hypertension) 02/18/2013    PCP: Nichols, Tonya  S, NP  REFERRING PROVIDER: Oley Bascom RAMAN, NP  REFERRING DIAG: M54.50,G89.29 (ICD-10-CM) - Chronic bilateral low back pain without sciatica M25.551 (ICD-10-CM) - Right hip pain  Rationale for Evaluation and Treatment: Rehabilitation  THERAPY DIAG:  Chronic bilateral low back pain without sciatica  Right hip pain  Muscle weakness (generalized)  ONSET  DATE: 3-4 months   SUBJECTIVE:                                                                                                                                                                                           SUBJECTIVE STATEMENT: Pt reports his R hip pain started 3-4 months ago without MOI. He notes seeing an orthopedic already who suggested a CSI, pt declined. He wants to try PT first. He is currently using a prescription cream that has helped with the pain. Pt is noting pain with walking, lying on the R side, and getting out of the car.    PERTINENT HISTORY:  PAD, DM II, HTN, PVD, lung cancer, CAD, hyperlipidemia   PAIN:  Are you having pain? Yes: NPRS scale: 0/10 Pain location: R lateral hip Pain description: cramping, ache Aggravating factors: walking Relieving factors: cream, pressing into hip when walking   PRECAUTIONS: None  RED FLAGS: None   WEIGHT BEARING RESTRICTIONS: No  FALLS:  Has patient fallen in last 6 months? No  LIVING ENVIRONMENT: Lives with: lives with their family Lives in: House/apartment Stairs: Yes: External: 2 steps; none Has following equipment at home: Single point cane and Walker - 2 wheeled  OCCUPATION: retired/disabled   PLOF: Independent  PATIENT GOALS: less pain  NEXT MD VISIT: 07/19/2024  OBJECTIVE:  Note: Objective measures were completed at Evaluation unless otherwise noted.  DIAGNOSTIC FINDINGS:  Xray IMPRESSION: 1. Moderate right hip osteoarthritis.  PATIENT SURVEYS:  PSFS: THE PATIENT SPECIFIC FUNCTIONAL SCALE  Place score of 0-10 (0 = unable to perform activity and 10 = able to perform activity at the same level as before injury or problem)  Activity Date: 06/16/2024    Walking  8    2.  Laying on R Side  6    3.  Getting In/Out SUV 8    Total Score 22/3=7.3      Total Score = Sum of activity scores/number of activities  Minimally Detectable Change: 3 points (for single activity); 2 points (for average  score)  [facility] (nd). The Patient Specific Functional Scale . Retrieved from [email or web address]    COGNITION: Overall cognitive status: Within functional limits for tasks assessed     SENSATION: Not tested  POSTURE: No Significant postural limitations  LUMBAR  ROM:   AROM eval  Flexion Finger tips to knees  Extension 75%  Right lateral flexion 75%  Left lateral flexion 75%  Right rotation 75%  Left rotation 75%   (Blank rows = not tested)  LOWER EXTREMITY ROM:     Active / Passive Right eval Left eval  Hip flexion PROM 90 PROM 100  Hip extension AROM 3 AROM 3  Hip abduction    Hip adduction    Hip internal rotation PROM 0 PROM 0  Hip external rotation AROM 40 AROM 40  Knee flexion    Knee extension    Ankle dorsiflexion    Ankle plantarflexion    Ankle inversion    Ankle eversion     (Blank rows = not tested)  LOWER EXTREMITY MMT:  deferred due to ROM limitations  MMT Right eval Left eval  Hip flexion    Hip extension    Hip abduction    Hip adduction    Hip internal rotation    Hip external rotation    Knee flexion    Knee extension    Ankle dorsiflexion    Ankle plantarflexion    Ankle inversion    Ankle eversion     (Blank rows = not tested)  LUMBAR SPECIAL TESTS:  FABER test: Negative  FUNCTIONAL TESTS:  5 times sit to stand: 12 sec SLS: unable B  Functional squat: performs hip hinge when asked to squat   GAIT: Distance walked: 38ft Assistive device utilized: None Level of assistance: Complete Independence Comments: normal   TREATMENT DATE:  [facility] Adult PT Treatment:                                                DATE: 06/16/2024                                                                                                                                Initial evaluation: see patient education and home exercise program as noted below     PATIENT EDUCATION:   Education details: POC, HEP, diagnosis, prognosis Person educated: Patient Education method: Explanation and Handouts Education comprehension: verbalized understanding and needs further education  HOME EXERCISE PROGRAM: Access Code: [email or web address] URL: [email or web address] Date: 06/16/2024 Prepared by: Marijo Berber  Exercises - Supine Bridge  - 1 x daily - 7 x weekly - 2-3 sets - 10 reps - Clamshell with Resistance  - 1 x daily - 7 x weekly - 2-3 sets - 10 reps - Seated Gluteal Sets  - 1 x daily - 7 x weekly - 2-3 sets - 10 reps - Side Stepping with Counter Support  - 1 x daily - 7 x weekly - 2-3 sets - 10 reps  ASSESSMENT:  CLINICAL IMPRESSION: Patient is a 69 y.o. M who was seen  today for physical therapy evaluation and treatment for R hip pain. The pt demonstrates poor R hip ROM, decreased strength as seen with STS and squat, and poor balance. Pt is having pain with walking, getting out of the car, and lying on the R side. He does have moderate OA, Xray confirmed, which contributes to the decreased ROM. The pt will benefit from skilled physical therapy to decrease pain and increase function.    OBJECTIVE IMPAIRMENTS: decreased activity tolerance, decreased ROM, decreased strength, impaired flexibility, improper body mechanics, and pain.   ACTIVITY LIMITATIONS: standing, squatting, sleeping, and stairs  PARTICIPATION LIMITATIONS: house chores  PERSONAL FACTORS: Fitness, Time since onset of injury/illness/exacerbation, and 3+ comorbidities: PAD, DM II, HTN, PVD, lung cancer, CAD, hyperlipidemia  are also affecting patient's functional outcome.   REHAB POTENTIAL: Fair due to above listed factors  CLINICAL DECISION MAKING: Evolving/moderate complexity  EVALUATION COMPLEXITY: Moderate   GOALS: Goals reviewed with patient? Yes  SHORT TERM GOALS: Target date: 07/14/2024  Pt will be compliant and independent with HEP to assist with symptom management/recovery at home.   Baseline: 27QA2HDM Goal status: INITIAL  2.  Pt will report 25% improvement of symptoms since start of PT.  Baseline: 0% Goal status: INITIAL   LONG TERM GOALS: Target date: 08/11/2024  Pt will be able to walk for 20+ mins without pain.  Baseline: pain Goal status: INITIAL  2.  Pt will be able to perform SLS for at least 5 sec B.  Baseline: unable  Goal status: INITIAL  3.  Pt will be comfortable with her final HEP in order to continue any symptom management at home and to avoid regression.   Baseline:  Goal status: INITIAL  PLAN:  PT FREQUENCY: 2x/week  PT DURATION: 8 weeks  PLANNED INTERVENTIONS: 97164- PT Re-evaluation, 97110-Therapeutic exercises, 97530- Therapeutic activity, 97112- Neuromuscular re-education, 97535- Self Care, 02859- Manual therapy, [phone]- Gait training, [phone]- Aquatic Therapy, [phone]- Electrical stimulation (unattended), [phone]- Traction (mechanical), [phone] (1-2 muscles), [phone] (3+ muscles)- Dry Needling, Patient/Family education, Balance training, Joint mobilization, Joint manipulation, Spinal manipulation, Spinal mobilization, Cryotherapy, and Moist heat.  PLAN FOR NEXT SESSION: HEP review. Hip strengthening, hip stretching, hip mobilizations.    Marijo DELENA Berber, PT 06/16/2024, 12:08 PM  "

## 2024-06-16 ENCOUNTER — Other Ambulatory Visit: Payer: Self-pay

## 2024-06-16 ENCOUNTER — Ambulatory Visit: Attending: Nurse Practitioner

## 2024-06-16 DIAGNOSIS — M545 Low back pain, unspecified: Secondary | ICD-10-CM | POA: Insufficient documentation

## 2024-06-16 DIAGNOSIS — G8929 Other chronic pain: Secondary | ICD-10-CM | POA: Insufficient documentation

## 2024-06-16 DIAGNOSIS — M6281 Muscle weakness (generalized): Secondary | ICD-10-CM | POA: Diagnosis present

## 2024-06-16 DIAGNOSIS — M25551 Pain in right hip: Secondary | ICD-10-CM | POA: Insufficient documentation

## 2024-06-17 ENCOUNTER — Ambulatory Visit: Payer: Self-pay | Admitting: Nurse Practitioner

## 2024-06-18 ENCOUNTER — Encounter: Payer: Self-pay | Admitting: Internal Medicine

## 2024-06-29 ENCOUNTER — Ambulatory Visit

## 2024-06-29 DIAGNOSIS — M6281 Muscle weakness (generalized): Secondary | ICD-10-CM

## 2024-06-29 DIAGNOSIS — M25551 Pain in right hip: Secondary | ICD-10-CM

## 2024-06-29 DIAGNOSIS — G8929 Other chronic pain: Secondary | ICD-10-CM

## 2024-06-29 NOTE — Therapy (Signed)
 " OUTPATIENT PHYSICAL THERAPY TREATMENT   Patient Name: Kirk [name] MRN: [id number] DOB:[birth year], 69 y.o., male Today's Date: 06/29/2024  END OF SESSION:  PT End of Session - 06/29/24 1349     Visit Number 2    Number of Visits 16    Date for Recertification  08/11/24    Authorization Type [employer] MRC    Authorization Time Period pending auth    PT Start Time 1400    PT Stop Time 1440    PT Time Calculation (min) 40 min           Past Medical History:  Diagnosis Date   Arthritis    right knee (09/04/2015)   Atrial flutter with rapid ventricular response (HCC) 04/19/2019   Dizziness 10/2019   Dyspnea    increased exertion   Elevated serum creatinine 07/2020   HTN (hypertension)    Hyperlipidemia    Kidney disease    stage 3B   Lung cancer (HCC) 05/2019   Microalbuminuria 07/2020   NSTEMI (non-ST elevated myocardial infarction) (HCC) 09/04/2015   a. cath 09/05/2015: 95% stenosis mid-LAD, 55% mid-RCA, and 40% prox-RCA. PCI performed w/ Synergy DES to LAD   PAD (peripheral artery disease)    Stenting of bilateral iliacs in 2003.   Refusal of blood transfusions as patient is Jehovah's Witness    Type II diabetes mellitus (HCC)    Vitamin D  deficiency 07/2020   Past Surgical History:  Procedure Laterality Date   CARDIAC CATHETERIZATION N/A 09/05/2015   Procedure: Left Heart Cath and Coronary Angiography;  Surgeon: Debby DELENA Sor, MD;  Location: [facility] INVASIVE CV LAB;  Service: Cardiovascular;  Laterality: N/A;   CARDIAC CATHETERIZATION N/A 09/05/2015   Procedure: Coronary Stent Intervention;  Surgeon: Debby DELENA Sor, MD;  Location: [facility] INVASIVE CV LAB;  Service: Cardiovascular;  Laterality: N/A;   IR IMAGING GUIDED PORT INSERTION  07/15/2019   ORIF CONGENITAL HIP DISLOCATION Left ~ 1965   put pins in it   PERIPHERAL VASCULAR CATHETERIZATION Bilateral 2003   stenting   TONSILLECTOMY  ~ 1963   Patient Active Problem List   Diagnosis Date Noted   Upper respiratory  tract infection 07/11/2022   Iron deficiency anemia 03/28/2022   Need for immunization against influenza 03/08/2022   Chemotherapy-induced neuropathy 05/31/2021   Drug-induced neutropenia 12/26/2020   Port-A-Cath in place 12/29/2019   Metastatic non-small cell lung cancer (HCC) 07/07/2019   Encounter for antineoplastic chemotherapy 07/07/2019   Encounter for antineoplastic immunotherapy 07/07/2019   Goals of care, counseling/discussion 06/28/2019   Adenocarcinoma of right lung, stage 4 (HCC) 06/28/2019   Cancer associated pain 06/28/2019   CAD (coronary artery disease) 06/08/2019   Mediastinal lymphadenopathy 06/03/2019   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) 06/03/2019   Hemoglobin A1C between 7% and 9% indicating borderline diabetic control (HCC) 05/10/2019   COVID-19 virus infection 05/10/2019   Atrial flutter with rapid ventricular response (HCC) 04/19/2019   Chest pain with moderate risk for cardiac etiology 04/19/2019   Paroxysmal atrial flutter (HCC) 04/19/2019   Chest congestion 07/10/2018   Cough 07/10/2018   NSTEMI (non-ST elevated myocardial infarction) (HCC) 09/04/2015   PVD (peripheral vascular disease) 06/08/2015   DDD (degenerative disc disease), cervical 06/08/2015   Hyperlipidemia 06/08/2015   Diabetes mellitus with nephropathy (HCC) 06/08/2015   Diabetes mellitus with peripheral vascular disease (HCC) 06/08/2015   Annual physical exam 01/03/2015   PAD (peripheral artery disease) 02/18/2013   Diabetes (HCC) 02/18/2013   HTN (hypertension) 02/18/2013  PCP: Oley Bascom RAMAN, NP  REFERRING PROVIDER: Oley Bascom RAMAN, NP  REFERRING DIAG: [phone] (ICD-10-CM) - Chronic bilateral low back pain without sciatica M25.551 (ICD-10-CM) - Right hip pain  Rationale for Evaluation and Treatment: Rehabilitation  THERAPY DIAG:  Chronic bilateral low back pain without sciatica  Right hip pain  Muscle weakness (generalized)  ONSET DATE: 3-4 months    SUBJECTIVE:                                                                                                                                                                                           SUBJECTIVE STATEMENT: 06/29/2024 Pt reporting no pain upon arrival. He has not been compliant with HEP.   EVAL Pt reports his R hip pain started 3-4 months ago without MOI. He notes seeing an orthopedic already who suggested a CSI, pt declined. He wants to try PT first. He is currently using a prescription cream that has helped with the pain. Pt is noting pain with walking, lying on the R side, and getting out of the car.    PERTINENT HISTORY:  PAD, DM II, HTN, PVD, lung cancer, CAD, hyperlipidemia   PAIN:  Are you having pain? Yes: NPRS scale: 0/10 Pain location: R lateral hip Pain description: cramping, ache Aggravating factors: walking Relieving factors: cream, pressing into hip when walking   PRECAUTIONS: None  RED FLAGS: None   WEIGHT BEARING RESTRICTIONS: No  FALLS:  Has patient fallen in last 6 months? No  LIVING ENVIRONMENT: Lives with: lives with their family Lives in: House/apartment Stairs: Yes: External: 2 steps; none Has following equipment at home: Single point cane and Walker - 2 wheeled  OCCUPATION: retired/disabled   PLOF: Independent  PATIENT GOALS: less pain  NEXT MD VISIT: 07/19/2024  OBJECTIVE:  Note: Objective measures were completed at Evaluation unless otherwise noted.  DIAGNOSTIC FINDINGS:  Xray IMPRESSION: 1. Moderate right hip osteoarthritis.  PATIENT SURVEYS:  PSFS: THE PATIENT SPECIFIC FUNCTIONAL SCALE  Place score of 0-10 (0 = unable to perform activity and 10 = able to perform activity at the same level as before injury or problem)  Activity Date: 06/29/2024    Walking  8    2.  Laying on R Side  6    3.  Getting In/Out SUV 8    Total Score 22/3=7.3      Total Score = Sum of activity scores/number of activities  Minimally  Detectable Change: 3 points (for single activity); 2 points (for average score)  [facility] (nd). The Patient Specific Functional Scale . Retrieved from [email or web address]    COGNITION: Overall cognitive status: Within  functional limits for tasks assessed     SENSATION: Not tested  POSTURE: No Significant postural limitations  LUMBAR ROM:   AROM eval  Flexion Finger tips to knees  Extension 75%  Right lateral flexion 75%  Left lateral flexion 75%  Right rotation 75%  Left rotation 75%   (Blank rows = not tested)  LOWER EXTREMITY ROM:     Active / Passive Right eval Left eval  Hip flexion PROM 90 PROM 100  Hip extension AROM 3 AROM 3  Hip abduction    Hip adduction    Hip internal rotation PROM 0 PROM 0  Hip external rotation AROM 40 AROM 40  Knee flexion    Knee extension    Ankle dorsiflexion    Ankle plantarflexion    Ankle inversion    Ankle eversion     (Blank rows = not tested)  LOWER EXTREMITY MMT:  deferred due to ROM limitations  MMT Right eval Left eval  Hip flexion    Hip extension    Hip abduction    Hip adduction    Hip internal rotation    Hip external rotation    Knee flexion    Knee extension    Ankle dorsiflexion    Ankle plantarflexion    Ankle inversion    Ankle eversion     (Blank rows = not tested)  LUMBAR SPECIAL TESTS:  FABER test: Negative  FUNCTIONAL TESTS:  5 times sit to stand: 12 sec SLS: unable B  Functional squat: performs hip hinge when asked to squat   GAIT: Distance walked: 43ft Assistive device utilized: None Level of assistance: Complete Independence Comments: normal   TREATMENT DATE:  [facility] Adult PT Treatment:                                                DATE: 06/29/2024   Neuro Re-Ed:  Bridge x 10, RTB x 10   Supine hip ADD iso with ball x15 Clam shell RTB x 10 B  STS x 10  Staggered stance STS x 10 B  Side stepping RTB x 5 laps at  counter Step up with opposite knee drive x 10 B  Paloff press 2x10 B with black looped band Standing hip extension with yellow looped band 2x10 B   [facility] Adult PT Treatment:                                                DATE: 06/16/2024                                                                                                                                Initial  evaluation: see patient education and home exercise program as noted below     PATIENT EDUCATION:  Education details: POC, HEP, diagnosis, prognosis Person educated: Patient Education method: Explanation and Handouts Education comprehension: verbalized understanding and needs further education  HOME EXERCISE PROGRAM: Access Code: [email or web address] URL: [email or web address] Date: 06/16/2024 Prepared by: Marijo Berber  Exercises - Supine Bridge  - 1 x daily - 7 x weekly - 2-3 sets - 10 reps - Clamshell with Resistance  - 1 x daily - 7 x weekly - 2-3 sets - 10 reps - Seated Gluteal Sets  - 1 x daily - 7 x weekly - 2-3 sets - 10 reps - Side Stepping with Counter Support  - 1 x daily - 7 x weekly - 2-3 sets - 10 reps  ASSESSMENT:  CLINICAL IMPRESSION: 06/29/2024  Pt introduced to hip and core strengthening exercises as well as HEP review. No pain with exercises. Many cues to engage the core and have upright posture. Pt limited into active hip extension. Advised to perform HEP a few times a week at least. Pt verbalized understanding. The pt will benefit from skilled physical therapy to decrease pain and increase function.    EVAL Patient is a 69 y.o. M who was seen today for physical therapy evaluation and treatment for R hip pain. The pt demonstrates poor R hip ROM, decreased strength as seen with STS and squat, and poor balance. Pt is having pain with walking, getting out of the car, and lying on the R side. He does have moderate OA, Xray confirmed, which contributes to the decreased ROM. The pt will benefit from  skilled physical therapy to decrease pain and increase function.    OBJECTIVE IMPAIRMENTS: decreased activity tolerance, decreased ROM, decreased strength, impaired flexibility, improper body mechanics, and pain.   ACTIVITY LIMITATIONS: standing, squatting, sleeping, and stairs  PARTICIPATION LIMITATIONS: house chores  PERSONAL FACTORS: Fitness, Time since onset of injury/illness/exacerbation, and 3+ comorbidities: PAD, DM II, HTN, PVD, lung cancer, CAD, hyperlipidemia  are also affecting patient's functional outcome.   REHAB POTENTIAL: Fair due to above listed factors  CLINICAL DECISION MAKING: Evolving/moderate complexity  EVALUATION COMPLEXITY: Moderate   GOALS: Goals reviewed with patient? Yes  SHORT TERM GOALS: Target date: 07/14/2024  Pt will be compliant and independent with HEP to assist with symptom management/recovery at home.  Baseline: 27QA2HDM Goal status: INITIAL  2.  Pt will report 25% improvement of symptoms since start of PT.  Baseline: 0% Goal status: INITIAL   LONG TERM GOALS: Target date: 08/11/2024  Pt will be able to walk for 20+ mins without pain.  Baseline: pain Goal status: INITIAL  2.  Pt will be able to perform SLS for at least 5 sec B.  Baseline: unable  Goal status: INITIAL  3.  Pt will be comfortable with her final HEP in order to continue any symptom management at home and to avoid regression.   Baseline:  Goal status: INITIAL  PLAN:  PT FREQUENCY: 2x/week  PT DURATION: 8 weeks  PLANNED INTERVENTIONS: 97164- PT Re-evaluation, 97110-Therapeutic exercises, 97530- Therapeutic activity, 97112- Neuromuscular re-education, 97535- Self Care, 02859- Manual therapy, [phone]- Gait training, [phone]- Aquatic Therapy, [phone]- Electrical stimulation (unattended), [phone]- Traction (mechanical), [phone] (1-2 muscles), [phone] (3+ muscles)- Dry Needling, Patient/Family education, Balance training, Joint mobilization, Joint manipulation, Spinal manipulation,  Spinal mobilization, Cryotherapy, and Moist heat.  PLAN FOR NEXT SESSION: HEP review. Hip strengthening, hip stretching, hip mobilizations.    Marijo DELENA Berber, PT 06/29/2024, 2:46 PM  "

## 2024-07-01 ENCOUNTER — Other Ambulatory Visit: Payer: Self-pay | Admitting: Nurse Practitioner

## 2024-07-01 ENCOUNTER — Ambulatory Visit

## 2024-07-01 DIAGNOSIS — M6281 Muscle weakness (generalized): Secondary | ICD-10-CM

## 2024-07-01 DIAGNOSIS — M25551 Pain in right hip: Secondary | ICD-10-CM

## 2024-07-01 DIAGNOSIS — G8929 Other chronic pain: Secondary | ICD-10-CM

## 2024-07-01 NOTE — Telephone Encounter (Signed)
 clopidogrel  (PLAVIX ) 75 MG tablet [Pharmacy Med Name: CLOPIDOGREL  75 MG TABLET]

## 2024-07-01 NOTE — Therapy (Signed)
 " OUTPATIENT PHYSICAL THERAPY TREATMENT   Patient Name: Kirk [name] MRN: [id number] DOB:April 14, 1956, 69 y.o., male Today's Date: 07/01/2024  END OF SESSION:  PT End of Session - 07/01/24 1404     Visit Number 3    Number of Visits 16    Date for Recertification  08/11/24    Authorization Type UHC MRC    Authorization Time Period Approved 6 PT visits from 06/16/24-08/11/24    Authorization - Visit Number 3    Authorization - Number of Visits 6    PT Start Time 1401    PT Stop Time 1440    PT Time Calculation (min) 39 min    Activity Tolerance Patient tolerated treatment well    Behavior During Therapy [facility] for tasks assessed/performed            Past Medical History:  Diagnosis Date   Arthritis    right knee (09/04/2015)   Atrial flutter with rapid ventricular response (HCC) 04/19/2019   Dizziness 10/2019   Dyspnea    increased exertion   Elevated serum creatinine 07/2020   HTN (hypertension)    Hyperlipidemia    Kidney disease    stage 3B   Lung cancer (HCC) 05/2019   Microalbuminuria 07/2020   NSTEMI (non-ST elevated myocardial infarction) (HCC) 09/04/2015   a. cath 09/05/2015: 95% stenosis mid-LAD, 55% mid-RCA, and 40% prox-RCA. PCI performed w/ Synergy DES to LAD   PAD (peripheral artery disease)    Stenting of bilateral iliacs in 2003.   Refusal of blood transfusions as patient is Jehovah's Witness    Type II diabetes mellitus (HCC)    Vitamin D  deficiency 07/2020   Past Surgical History:  Procedure Laterality Date   CARDIAC CATHETERIZATION N/A 09/05/2015   Procedure: Left Heart Cath and Coronary Angiography;  Surgeon: Debby DELENA Sor, MD;  Location: [facility] INVASIVE CV LAB;  Service: Cardiovascular;  Laterality: N/A;   CARDIAC CATHETERIZATION N/A 09/05/2015   Procedure: Coronary Stent Intervention;  Surgeon: Debby DELENA Sor, MD;  Location: [facility] INVASIVE CV LAB;  Service: Cardiovascular;  Laterality: N/A;   IR IMAGING GUIDED PORT INSERTION  07/15/2019   ORIF CONGENITAL  HIP DISLOCATION Left ~ 1965   put pins in it   PERIPHERAL VASCULAR CATHETERIZATION Bilateral 2003   stenting   TONSILLECTOMY  ~ 1963   Patient Active Problem List   Diagnosis Date Noted   Upper respiratory tract infection 07/11/2022   Iron deficiency anemia 03/28/2022   Need for immunization against influenza 03/08/2022   Chemotherapy-induced neuropathy 05/31/2021   Drug-induced neutropenia 12/26/2020   Port-A-Cath in place 12/29/2019   Metastatic non-small cell lung cancer (HCC) 07/07/2019   Encounter for antineoplastic chemotherapy 07/07/2019   Encounter for antineoplastic immunotherapy 07/07/2019   Goals of care, counseling/discussion 06/28/2019   Adenocarcinoma of right lung, stage 4 (HCC) 06/28/2019   Cancer associated pain 06/28/2019   CAD (coronary artery disease) 06/08/2019   Mediastinal lymphadenopathy 06/03/2019   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) 06/03/2019   Hemoglobin A1C between 7% and 9% indicating borderline diabetic control (HCC) 05/10/2019   COVID-19 virus infection 05/10/2019   Atrial flutter with rapid ventricular response (HCC) 04/19/2019   Chest pain with moderate risk for cardiac etiology 04/19/2019   Paroxysmal atrial flutter (HCC) 04/19/2019   Chest congestion 07/10/2018   Cough 07/10/2018   NSTEMI (non-ST elevated myocardial infarction) (HCC) 09/04/2015   PVD (peripheral vascular disease) 06/08/2015   DDD (degenerative disc disease), cervical 06/08/2015   Hyperlipidemia 06/08/2015  Diabetes mellitus with nephropathy (HCC) 06/08/2015   Diabetes mellitus with peripheral vascular disease (HCC) 06/08/2015   Annual physical exam 01/03/2015   PAD (peripheral artery disease) 02/18/2013   Diabetes (HCC) 02/18/2013   HTN (hypertension) 02/18/2013    PCP: Oley Bascom RAMAN, NP  REFERRING PROVIDER: Oley Bascom RAMAN, NP  REFERRING DIAG: [phone] (ICD-10-CM) - Chronic bilateral low back pain without sciatica M25.551 (ICD-10-CM) - Right  hip pain  Rationale for Evaluation and Treatment: Rehabilitation  THERAPY DIAG:  Chronic bilateral low back pain without sciatica  Right hip pain  Muscle weakness (generalized)  ONSET DATE: 3-4 months   SUBJECTIVE:                                                                                                                                                                                           SUBJECTIVE STATEMENT: 07/01/2024 Patient reporting some soreness after last visit, but it has begn to decrease.   EVAL Pt reports his R hip pain started 3-4 months ago without MOI. He notes seeing an orthopedic already who suggested a CSI, pt declined. He wants to try PT first. He is currently using a prescription cream that has helped with the pain. Pt is noting pain with walking, lying on the R side, and getting out of the car.    PERTINENT HISTORY:  PAD, DM II, HTN, PVD, lung cancer, CAD, hyperlipidemia   PAIN:  Are you having pain? Yes: NPRS scale: 0/10 Pain location: R lateral hip Pain description: cramping, ache Aggravating factors: walking Relieving factors: cream, pressing into hip when walking   PRECAUTIONS: None  RED FLAGS: None   WEIGHT BEARING RESTRICTIONS: No  FALLS:  Has patient fallen in last 6 months? No  LIVING ENVIRONMENT: Lives with: lives with their family Lives in: House/apartment Stairs: Yes: External: 2 steps; none Has following equipment at home: Single point cane and Walker - 2 wheeled  OCCUPATION: retired/disabled   PLOF: Independent  PATIENT GOALS: less pain  NEXT MD VISIT: 07/19/2024  OBJECTIVE:  Note: Objective measures were completed at Evaluation unless otherwise noted.  DIAGNOSTIC FINDINGS:  Xray IMPRESSION: 1. Moderate right hip osteoarthritis.  PATIENT SURVEYS:  PSFS: THE PATIENT SPECIFIC FUNCTIONAL SCALE  Place score of 0-10 (0 = unable to perform activity and 10 = able to perform activity at the same level as before injury  or problem)  Activity Date: 07/01/2024    Walking  8    2.  Laying on R Side  6    3.  Getting In/Out SUV 8    Total Score 22/3=7.3      Total Score = Sum  of activity scores/number of activities  Minimally Detectable Change: 3 points (for single activity); 2 points (for average score)  [facility] (nd). The Patient Specific Functional Scale . Retrieved from [email or web address]    COGNITION: Overall cognitive status: Within functional limits for tasks assessed     SENSATION: Not tested  POSTURE: No Significant postural limitations  LUMBAR ROM:   AROM eval  Flexion Finger tips to knees  Extension 75%  Right lateral flexion 75%  Left lateral flexion 75%  Right rotation 75%  Left rotation 75%   (Blank rows = not tested)  LOWER EXTREMITY ROM:     Active / Passive Right eval Left eval  Hip flexion PROM 90 PROM 100  Hip extension AROM 3 AROM 3  Hip abduction    Hip adduction    Hip internal rotation PROM 0 PROM 0  Hip external rotation AROM 40 AROM 40  Knee flexion    Knee extension    Ankle dorsiflexion    Ankle plantarflexion    Ankle inversion    Ankle eversion     (Blank rows = not tested)  LOWER EXTREMITY MMT:  deferred due to ROM limitations  MMT Right eval Left eval  Hip flexion    Hip extension    Hip abduction    Hip adduction    Hip internal rotation    Hip external rotation    Knee flexion    Knee extension    Ankle dorsiflexion    Ankle plantarflexion    Ankle inversion    Ankle eversion     (Blank rows = not tested)  LUMBAR SPECIAL TESTS:  FABER test: Negative  FUNCTIONAL TESTS:  5 times sit to stand: 12 sec SLS: unable B  Functional squat: performs hip hinge when asked to squat   GAIT: Distance walked: 41ft Assistive device utilized: None Level of assistance: Complete Independence Comments: normal   TREATMENT DATE:   [facility] Adult PT Treatment:                                                 DATE: 07/01/2024   Neuro Re-Ed:  Bridge x 10, RTB 2 x 10   Supine hip ADD iso with ball 3x10 Clam shell RTB x 10 B  STS x 15 Staggered stance STS x 15 B  Side stepping RTB x 3 laps at counter Step up with opposite knee drive x 10 B  Paloff press 2x10 B with black looped band Standing hip extension at counter 2x10 B     PATIENT EDUCATION:  Education details: POC, HEP, diagnosis, prognosis Person educated: Patient Education method: Explanation and Handouts Education comprehension: verbalized understanding and needs further education  HOME EXERCISE PROGRAM: Access Code: [email or web address] URL: [email or web address] Date: 06/16/2024 Prepared by: Marijo Berber  Exercises - Supine Bridge  - 1 x daily - 7 x weekly - 2-3 sets - 10 reps - Clamshell with Resistance  - 1 x daily - 7 x weekly - 2-3 sets - 10 reps - Seated Gluteal Sets  - 1 x daily - 7 x weekly - 2-3 sets - 10 reps - Side Stepping with Counter Support  - 1 x daily - 7 x weekly - 2-3 sets - 10 reps  ASSESSMENT:  CLINICAL IMPRESSION: 07/01/2024 Patient was able to complete today's session with less frequent cueing required. He  has some fatigue and requires a few seated rest breaks today. However, he was able to complete increased overall exercise volume. Plan to continue progression per current POC to address pain intensity and improved overall function.     EVAL Patient is a 69 y.o. M who was seen today for physical therapy evaluation and treatment for R hip pain. The pt demonstrates poor R hip ROM, decreased strength as seen with STS and squat, and poor balance. Pt is having pain with walking, getting out of the car, and lying on the R side. He does have moderate OA, Xray confirmed, which contributes to the decreased ROM. The pt will benefit from skilled physical therapy to decrease pain and increase function.    OBJECTIVE IMPAIRMENTS: decreased activity tolerance, decreased ROM, decreased  strength, impaired flexibility, improper body mechanics, and pain.   ACTIVITY LIMITATIONS: standing, squatting, sleeping, and stairs  PARTICIPATION LIMITATIONS: house chores  PERSONAL FACTORS: Fitness, Time since onset of injury/illness/exacerbation, and 3+ comorbidities: PAD, DM II, HTN, PVD, lung cancer, CAD, hyperlipidemia  are also affecting patient's functional outcome.   REHAB POTENTIAL: Fair due to above listed factors  CLINICAL DECISION MAKING: Evolving/moderate complexity  EVALUATION COMPLEXITY: Moderate   GOALS: Goals reviewed with patient? Yes  SHORT TERM GOALS: Target date: 07/14/2024  Pt will be compliant and independent with HEP to assist with symptom management/recovery at home.  Baseline: 27QA2HDM Goal status: INITIAL  2.  Pt will report 25% improvement of symptoms since start of PT.  Baseline: 0% Goal status: INITIAL   LONG TERM GOALS: Target date: 08/11/2024  Pt will be able to walk for 20+ mins without pain.  Baseline: pain Goal status: INITIAL  2.  Pt will be able to perform SLS for at least 5 sec B.  Baseline: unable  Goal status: INITIAL  3.  Pt will be comfortable with her final HEP in order to continue any symptom management at home and to avoid regression.   Baseline:  Goal status: INITIAL  PLAN:  PT FREQUENCY: 2x/week  PT DURATION: 8 weeks  PLANNED INTERVENTIONS: 97164- PT Re-evaluation, 97110-Therapeutic exercises, 97530- Therapeutic activity, 97112- Neuromuscular re-education, 97535- Self Care, 02859- Manual therapy, [phone]- Gait training, [phone]- Aquatic Therapy, [phone]- Electrical stimulation (unattended), [phone]- Traction (mechanical), [phone] (1-2 muscles), [phone] (3+ muscles)- Dry Needling, Patient/Family education, Balance training, Joint mobilization, Joint manipulation, Spinal manipulation, Spinal mobilization, Cryotherapy, and Moist heat.  PLAN FOR NEXT SESSION: HEP review. Hip strengthening, hip stretching, hip mobilizations.     Marko Molt, PT, DPT  07/01/2024 2:41 PM   "

## 2024-07-06 ENCOUNTER — Ambulatory Visit (HOSPITAL_COMMUNITY)

## 2024-07-06 ENCOUNTER — Inpatient Hospital Stay: Payer: Self-pay | Attending: Internal Medicine

## 2024-07-06 ENCOUNTER — Ambulatory Visit

## 2024-07-06 ENCOUNTER — Other Ambulatory Visit

## 2024-07-08 ENCOUNTER — Ambulatory Visit

## 2024-07-13 ENCOUNTER — Ambulatory Visit

## 2024-07-14 ENCOUNTER — Ambulatory Visit: Admitting: Internal Medicine

## 2024-07-15 ENCOUNTER — Ambulatory Visit

## 2024-07-19 ENCOUNTER — Ambulatory Visit: Payer: Self-pay | Admitting: Nurse Practitioner

## 2024-07-20 ENCOUNTER — Ambulatory Visit

## 2024-07-22 ENCOUNTER — Ambulatory Visit

## 2024-07-27 ENCOUNTER — Other Ambulatory Visit: Payer: Self-pay

## 2024-08-03 ENCOUNTER — Ambulatory Visit

## 2024-08-05 ENCOUNTER — Ambulatory Visit: Payer: Self-pay
